# Patient Record
Sex: Male | Born: 1960 | Race: White | Hispanic: No | Marital: Married | Smoking: Never smoker
Health system: Southern US, Community
[De-identification: ages and names within clinical notes are randomized; demographics above are authoritative.]

## PROBLEM LIST (undated history)

## (undated) DIAGNOSIS — K222 Esophageal obstruction: Secondary | ICD-10-CM

## (undated) DIAGNOSIS — G4730 Sleep apnea, unspecified: Secondary | ICD-10-CM

## (undated) DIAGNOSIS — K219 Gastro-esophageal reflux disease without esophagitis: Secondary | ICD-10-CM

## (undated) DIAGNOSIS — F1110 Opioid abuse, uncomplicated: Secondary | ICD-10-CM

## (undated) DIAGNOSIS — J45909 Unspecified asthma, uncomplicated: Secondary | ICD-10-CM

## (undated) DIAGNOSIS — J690 Pneumonitis due to inhalation of food and vomit: Secondary | ICD-10-CM

## (undated) DIAGNOSIS — G8929 Other chronic pain: Secondary | ICD-10-CM

## (undated) DIAGNOSIS — K759 Inflammatory liver disease, unspecified: Secondary | ICD-10-CM

## (undated) DIAGNOSIS — I10 Essential (primary) hypertension: Secondary | ICD-10-CM

## (undated) DIAGNOSIS — S2224XA Fracture of xiphoid process, initial encounter for closed fracture: Secondary | ICD-10-CM

## (undated) DIAGNOSIS — F419 Anxiety disorder, unspecified: Secondary | ICD-10-CM

## (undated) HISTORY — PX: OTHER SURGICAL HISTORY: SHX169

## (undated) HISTORY — DX: Fracture of xiphoid process, initial encounter for closed fracture: S22.24XA

## (undated) HISTORY — DX: Inflammatory liver disease, unspecified: K75.9

---

## 2004-09-13 ENCOUNTER — Emergency Department (HOSPITAL_COMMUNITY): Admission: EM | Admit: 2004-09-13 | Discharge: 2004-09-13 | Payer: Self-pay | Admitting: Emergency Medicine

## 2010-04-21 ENCOUNTER — Emergency Department (HOSPITAL_COMMUNITY): Admission: EM | Admit: 2010-04-21 | Discharge: 2010-04-21 | Payer: Self-pay | Admitting: Emergency Medicine

## 2010-08-17 HISTORY — PX: OTHER SURGICAL HISTORY: SHX169

## 2013-01-17 ENCOUNTER — Ambulatory Visit (INDEPENDENT_AMBULATORY_CARE_PROVIDER_SITE_OTHER): Payer: 59 | Admitting: Internal Medicine

## 2013-01-17 ENCOUNTER — Encounter (INDEPENDENT_AMBULATORY_CARE_PROVIDER_SITE_OTHER): Payer: Self-pay | Admitting: Internal Medicine

## 2013-01-17 VITALS — BP 118/70 | HR 74 | Temp 97.7°F | Resp 18 | Ht 71.0 in | Wt 174.4 lb

## 2013-01-17 DIAGNOSIS — Z8619 Personal history of other infectious and parasitic diseases: Secondary | ICD-10-CM | POA: Insufficient documentation

## 2013-01-17 DIAGNOSIS — B182 Chronic viral hepatitis C: Secondary | ICD-10-CM

## 2013-01-17 DIAGNOSIS — F32A Depression, unspecified: Secondary | ICD-10-CM | POA: Insufficient documentation

## 2013-01-17 DIAGNOSIS — F418 Other specified anxiety disorders: Secondary | ICD-10-CM | POA: Insufficient documentation

## 2013-01-17 DIAGNOSIS — F329 Major depressive disorder, single episode, unspecified: Secondary | ICD-10-CM | POA: Insufficient documentation

## 2013-01-17 DIAGNOSIS — M25561 Pain in right knee: Secondary | ICD-10-CM | POA: Insufficient documentation
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
## 2013-01-17 NOTE — Patient Instructions (Addendum)
Physician will contact you with results of blood work and ultrasound when completed. 

## 2013-01-18 LAB — HCV RNA QUANT RFLX ULTRA OR GENOTYP: HCV Quantitative Log: 2.18 {Log} — ABNORMAL HIGH (ref <1.18)

## 2013-01-19 ENCOUNTER — Other Ambulatory Visit (HOSPITAL_COMMUNITY): Payer: 59

## 2013-01-23 ENCOUNTER — Ambulatory Visit (HOSPITAL_COMMUNITY)
Admission: RE | Admit: 2013-01-23 | Discharge: 2013-01-23 | Disposition: A | Discharge status: Home / Self Care | Payer: 59 | Source: Ambulatory Visit | Attending: Internal Medicine | Admitting: Internal Medicine

## 2013-01-23 DIAGNOSIS — K739 Chronic hepatitis, unspecified: Secondary | ICD-10-CM | POA: Insufficient documentation

## 2013-01-23 DIAGNOSIS — B182 Chronic viral hepatitis C: Secondary | ICD-10-CM

## 2013-01-27 ENCOUNTER — Telehealth (INDEPENDENT_AMBULATORY_CARE_PROVIDER_SITE_OTHER): Payer: Self-pay | Admitting: *Deleted

## 2013-01-27 NOTE — Telephone Encounter (Signed)
Forwarded to Dr.Rehman. 

## 2013-01-27 NOTE — Telephone Encounter (Signed)
Was told to call Dr. Karilyn Cota back and let him know when it is a good time to reach him. He gets off work at 4 pm. 4 to 4:30 is best or anytime afterwards. The best phone number is [phone].

## 2013-01-29 NOTE — Telephone Encounter (Signed)
I called in yesterday afternoon and there was no answer message left; He needs ultrasound-guided liver biopsy and office visit. Liver biopsy non-urgent can be performed in July or August

## 2013-01-30 ENCOUNTER — Other Ambulatory Visit (INDEPENDENT_AMBULATORY_CARE_PROVIDER_SITE_OTHER): Payer: Self-pay | Admitting: Internal Medicine

## 2013-01-30 NOTE — Addendum Note (Signed)
Addended by: Malissa Hippo on: 01/30/2013 04:58 PM   Modules accepted: Orders

## 2013-01-30 NOTE — Telephone Encounter (Signed)
Apt has been scheduled for 04/03/13 with Dr. Karilyn Cota.

## 2013-01-30 NOTE — Telephone Encounter (Signed)
Order for liver bx sent to [facility], they will contact patient with appt

## 2013-02-14 ENCOUNTER — Encounter (INDEPENDENT_AMBULATORY_CARE_PROVIDER_SITE_OTHER): Payer: Self-pay

## 2013-03-01 ENCOUNTER — Other Ambulatory Visit: Payer: Self-pay | Admitting: Radiology

## 2013-03-02 ENCOUNTER — Other Ambulatory Visit: Payer: Self-pay | Admitting: Radiology

## 2013-03-06 ENCOUNTER — Encounter (HOSPITAL_COMMUNITY): Payer: Self-pay | Admitting: Pharmacy Technician

## 2013-03-07 ENCOUNTER — Encounter (HOSPITAL_COMMUNITY): Payer: Self-pay

## 2013-03-07 ENCOUNTER — Ambulatory Visit (HOSPITAL_COMMUNITY)
Admission: RE | Admit: 2013-03-07 | Discharge: 2013-03-07 | Disposition: A | Discharge status: Home / Self Care | Payer: 59 | Source: Ambulatory Visit | Attending: Internal Medicine | Admitting: Internal Medicine

## 2013-03-07 DIAGNOSIS — B182 Chronic viral hepatitis C: Secondary | ICD-10-CM | POA: Insufficient documentation

## 2013-03-07 LAB — CBC
HCT: 42 % (ref 39.0–52.0)
Hemoglobin: 15.1 g/dL (ref 13.0–17.0)
MCHC: 36 g/dL (ref 30.0–36.0)
MCV: 88.4 fL (ref 78.0–100.0)
WBC: 5 10*3/uL (ref 4.0–10.5)

## 2013-03-07 LAB — APTT: aPTT: 31 seconds (ref 24–37)

## 2013-03-07 LAB — PROTIME-INR: INR: 1 (ref 0.00–1.49)

## 2013-03-07 MED ORDER — MIDAZOLAM HCL 2 MG/2ML IJ SOLN
INTRAMUSCULAR | Status: AC | PRN
  Administered 2013-03-07: 1 mg via INTRAVENOUS
  Administered 2013-03-07 (×2): 2 mg via INTRAVENOUS

## 2013-03-07 MED ORDER — MIDAZOLAM HCL 2 MG/2ML IJ SOLN
INTRAMUSCULAR | Status: AC
  Filled 2013-03-07: qty 6

## 2013-03-07 MED ORDER — FENTANYL CITRATE 0.05 MG/ML IJ SOLN
INTRAMUSCULAR | Status: AC
  Filled 2013-03-07: qty 4

## 2013-03-07 MED ORDER — FENTANYL CITRATE 0.05 MG/ML IJ SOLN
INTRAMUSCULAR | Status: AC | PRN
  Administered 2013-03-07 (×2): 50 ug via INTRAVENOUS

## 2013-03-07 MED ORDER — SODIUM CHLORIDE 0.9 % IV SOLN
Freq: Once | INTRAVENOUS | Status: AC
  Administered 2013-03-07: 09:00:00 via INTRAVENOUS

## 2013-03-07 NOTE — H&P (Signed)
Chief Complaint: "I'm here for a liver biopsy" Referring Physician:Rehman HPI: Christian Ellison is an 52 y.o. male with chronic Hepatitis C and is referred for random liver core biopsy. PMHx and meds reviewed. Pt feels well otherwise.  Past Medical History: History reviewed. No pertinent past medical history.  Past Surgical History:  Past Surgical History  Procedure Laterality Date  . Rotor cuff  2012    Right Shoulder  . Fracture right leg      Patient has rod/screws in this leg    Family History: No family history on file.  Social History:  reports that he has never smoked. He has never used smokeless tobacco. He reports that he does not drink alcohol or use illicit drugs.  Allergies: No Known Allergies  Medications:   Medication List    ASK your doctor about these medications       ALPRAZolam 1 MG tablet  Commonly known as:  XANAX  Take 1 mg by mouth at bedtime as needed for sleep.     aspirin 81 MG tablet  Take 81 mg by mouth daily.     citalopram 40 MG tablet  Commonly known as:  CELEXA  Take 40 mg by mouth daily.     LORazepam 1 MG tablet  Commonly known as:  ATIVAN  Take 1 mg by mouth at bedtime as needed. For sleep     MULTIVITAMIN PO  Take 1 tablet by mouth daily.     NORCO 10-325 MG per tablet  Generic drug:  HYDROcodone-acetaminophen  Take 1 tablet by mouth every 8 (eight) hours as needed for pain.        Please HPI for pertinent positives, otherwise complete 10 system ROS negative.  Physical Exam: There were no vitals taken for this visit.    General Appearance:  Alert, cooperative, no distress, appears stated age  Head:  Normocephalic, without obvious abnormality, atraumatic  ENT: Unremarkable  Neck: Supple, symmetrical, trachea midline  Lungs:   Clear to auscultation bilaterally, no w/r/r, respirations unlabored without use of accessory muscles.  Heart:  Regular rate and rhythm, S1, S2 normal, no murmur, rub or gallop.  Abdomen:    Soft, non-tender, non distended.  Neurologic: Normal affect, no gross deficits.   Results for orders placed during the hospital encounter of 03/07/13 (from the past 48 hour(s))  CBC     Status: None   Collection Time    03/07/13  9:03 AM      Result Value Range   WBC 5.0  4.0 - 10.5 K/uL   RBC 4.75  4.22 - 5.81 MIL/uL   Hemoglobin 15.1  13.0 - 17.0 g/dL   HCT 13.0  86.5 - 78.4 %   MCV 88.4  78.0 - 100.0 fL   MCH 31.8  26.0 - 34.0 pg   MCHC 36.0  30.0 - 36.0 g/dL   RDW 69.6  29.5 - 28.4 %   Platelets 177  150 - 400 K/uL   No results found.  Assessment/Plan Chronic Hepatitis C For US guided liver core biopsy. Discussed procedure, risks, complications, use of sedation. Labs reviewed, coags pending. Consent signed in chart  Brayton El PA-C 03/07/2013, 9:45 AM

## 2013-03-07 NOTE — ED Notes (Signed)
Patient denies pain and is resting comfortably with eyes closed 

## 2013-03-07 NOTE — Procedures (Signed)
Technically successful US guided biopsy of right lobe of liver.  No immediate complications.   

## 2013-03-16 ENCOUNTER — Encounter (INDEPENDENT_AMBULATORY_CARE_PROVIDER_SITE_OTHER): Payer: Self-pay | Admitting: *Deleted

## 2013-04-03 ENCOUNTER — Ambulatory Visit (INDEPENDENT_AMBULATORY_CARE_PROVIDER_SITE_OTHER): Payer: 59 | Admitting: Internal Medicine

## 2013-04-03 ENCOUNTER — Encounter (INDEPENDENT_AMBULATORY_CARE_PROVIDER_SITE_OTHER): Payer: Self-pay | Admitting: Internal Medicine

## 2013-04-03 VITALS — BP 110/70 | HR 78 | Resp 20 | Ht 71.0 in | Wt 180.7 lb

## 2013-04-03 DIAGNOSIS — B182 Chronic viral hepatitis C: Secondary | ICD-10-CM

## 2013-04-03 NOTE — Progress Notes (Signed)
Presenting complaint;  Discuss treatment for chronic hepatitis C.  Subjective:  Patient is 52 year old Caucasian male who has chronic hepatitis C. he underwent liver biopsy on 03/07/2013 which revealed grade 2 and stage 0 disease. He's now what he goes his employer is merging with another company and he is not sure whether or not he would have job or if his benefits will change. He is having to take pain medication for right knee and leg pain. He is working 7 days a week. Today he is accompanied by his girlfriend of 5 years. She wants to be tested but she does not have primary care physician. He remains with good appetite. He has gained 6 pounds. He continues to abstain from drinking alcohol. Last drink was 10 months ago to  Current Medications: Current Outpatient Prescriptions  Medication Sig Dispense Refill  . ALPRAZolam (XANAX) 1 MG tablet Take 1 mg by mouth at bedtime as needed for sleep.       Marland Kitchen aspirin 81 MG tablet Take 81 mg by mouth daily.      . citalopram (CELEXA) 40 MG tablet Take 40 mg by mouth daily.      Marland Kitchen gabapentin (NEURONTIN) 100 MG capsule Take 100 mg by mouth 2 (two) times daily.       . Multiple Vitamins-Minerals (MULTIVITAMIN PO) Take 1 tablet by mouth daily.       . Oxycodone HCl 10 MG TABS Take 10 mg by mouth 3 (three) times daily.        No current facility-administered medications for this visit.    Objective: Blood pressure 110/70, pulse 78, resp. rate 20, height 5\' 11"  (1.803 m), weight 180 lb 11.2 oz (81.965 kg). Patient is alert and in no acute distress   Labs/studies Results:  ultrasound liver guided biopsy on 03/07/2013 revealed grade 2 inflammation and stage 0 fibrosis. HCV quantitative was 153 IU/mL on 01/18/2023 and ALT of 2.18. Genotype results cannot be located.  Assessment:  #1. Chronic hepatitis C. Liver biopsy revealed no evidence of fibrosis whatsoever. He therefore could delay treatment until next generation of antivirals are available which  would be in near future. I doubt that he would be able to tolerate interferon-based therapies given his depression anxiety and insomnia.    Plan:  Office visit in 6 months or earlier if newer therapies are available. Will try to find results of genotype. Screening colonoscopy when he is ready.

## 2013-04-03 NOTE — Patient Instructions (Signed)
Office and contact you as soon as new medications approved

## 2013-04-04 ENCOUNTER — Telehealth (INDEPENDENT_AMBULATORY_CARE_PROVIDER_SITE_OTHER): Payer: Self-pay | Admitting: *Deleted

## 2013-04-04 NOTE — Telephone Encounter (Signed)
Patient was called and a message left asking that he have his lab work drawn.  I will make Misty Stanley aware.

## 2013-04-04 NOTE — Telephone Encounter (Signed)
Unable to find the Genotype in the lab selection. An order was written and sent to [facility].

## 2013-07-03 ENCOUNTER — Encounter (INDEPENDENT_AMBULATORY_CARE_PROVIDER_SITE_OTHER): Payer: Self-pay | Admitting: *Deleted

## 2013-07-03 ENCOUNTER — Encounter (INDEPENDENT_AMBULATORY_CARE_PROVIDER_SITE_OTHER): Payer: Self-pay | Admitting: Internal Medicine

## 2013-07-03 ENCOUNTER — Ambulatory Visit (INDEPENDENT_AMBULATORY_CARE_PROVIDER_SITE_OTHER): Payer: 59 | Admitting: Internal Medicine

## 2013-07-03 VITALS — BP 124/72 | HR 76 | Temp 98.7°F | Resp 18 | Ht 71.0 in | Wt 181.0 lb

## 2013-07-03 DIAGNOSIS — B182 Chronic viral hepatitis C: Secondary | ICD-10-CM

## 2013-07-03 NOTE — Patient Instructions (Signed)
Physician will contact with results of blood test and further recommendations.

## 2013-07-03 NOTE — Progress Notes (Signed)
Presenting complaint;   followup for chronic hepatitis C.  Subjective:  Patient is 52 year old Caucasian male who has chronic hepatitis C genotype unknown whether liver biopsy in July 2014 revealing grade 2 and stage I disease. At the time of last lifetest genotype was ordered but somehow not done. He was advised to go back to the lab but he was unable to do so. He is anxious to be treated. Today he is accompanied by his mother and sister. He has good appetite. He denies nausea vomiting abdominal pain melena or rectal bleeding. He is not drinking alcohol anymore. His only symptom is one of fatigue but he is working 6-7 days each week.  Current Medications: Current Outpatient Prescriptions  Medication Sig Dispense Refill  . acetaminophen (TYLENOL) 650 MG CR tablet Take 650 mg by mouth as needed for pain.      Marland Kitchen ALPRAZolam (XANAX) 1 MG tablet Take 1 mg by mouth at bedtime as needed for sleep.       Marland Kitchen aspirin 81 MG tablet Take 81 mg by mouth daily.      . citalopram (CELEXA) 40 MG tablet Take 40 mg by mouth daily.      . Naproxen Sodium (ALEVE PO) Take by mouth as needed.      . Oxycodone HCl 10 MG TABS Take 10 mg by mouth 3 (three) times daily.       Marland Kitchen gabapentin (NEURONTIN) 100 MG capsule Take 100 mg by mouth 2 (two) times daily.        No current facility-administered medications for this visit.     Objective: Blood pressure 124/72, pulse 76, temperature 98.7 F (37.1 C), temperature source Oral, resp. rate 18, height 5\' 11"  (1.803 m), weight 181 lb (82.101 kg). Patient is alert and in no acute distress.   Labs/studies Results: HCVRNA [id number] was 153 iu/mL HCV quantitative log     2.18    Assessment:  #1. Chronic hepatitis C. He has mild disease on liver biopsy of July 2014 revealing grade 2 stage 0 disease. Presuming he has genotype 1 he will be candidate for  Harvoni. Treatment options reviewed with patient and his family members including side effects. In all probability  he will not be getting interferometer based regimens therefore he does not have to be worried about depression or other serious side effects. #2. He is average risk for CRC.    Plan:  Patient will go to the lab for HCV genotyping. Therapy would be initiated as soon as genotype is determined. Will plan screening colonoscopy once C. therapy is completed.

## 2013-07-04 ENCOUNTER — Encounter (INDEPENDENT_AMBULATORY_CARE_PROVIDER_SITE_OTHER): Payer: Self-pay | Admitting: *Deleted

## 2013-07-07 ENCOUNTER — Telehealth (INDEPENDENT_AMBULATORY_CARE_PROVIDER_SITE_OTHER): Payer: Self-pay | Admitting: *Deleted

## 2013-07-07 LAB — HEPATITIS C GENOTYPE

## 2013-07-07 NOTE — Telephone Encounter (Signed)
Open in error

## 2013-07-07 NOTE — Telephone Encounter (Signed)
Theora Gianotti called about Greg's recent lab. In Epic the HCV RNA Quant < 15 , the genotype is still pending. I called [facility] and they are going to address And let us know when the  result will be ready. Theora Gianotti would like for you to call her with your recommendations, I did tell her the above result. Theora Gianotti 's number is [phone]

## 2013-07-11 NOTE — Telephone Encounter (Signed)
I discussed results with Christian Ellison yesterday. Still waiting for final result.

## 2013-07-17 ENCOUNTER — Telehealth (INDEPENDENT_AMBULATORY_CARE_PROVIDER_SITE_OTHER): Payer: Self-pay | Admitting: *Deleted

## 2013-07-17 DIAGNOSIS — B1920 Unspecified viral hepatitis C without hepatic coma: Secondary | ICD-10-CM

## 2013-07-17 NOTE — Telephone Encounter (Signed)
Per Dr.Rehman the patient will need to have labs drawn in 3 months per NUR.

## 2013-08-14 ENCOUNTER — Ambulatory Visit (INDEPENDENT_AMBULATORY_CARE_PROVIDER_SITE_OTHER): Payer: 59 | Admitting: Internal Medicine

## 2013-08-31 ENCOUNTER — Telehealth (INDEPENDENT_AMBULATORY_CARE_PROVIDER_SITE_OTHER): Payer: Self-pay | Admitting: *Deleted

## 2013-08-31 ENCOUNTER — Other Ambulatory Visit (INDEPENDENT_AMBULATORY_CARE_PROVIDER_SITE_OTHER): Payer: Self-pay | Admitting: *Deleted

## 2013-08-31 DIAGNOSIS — B1920 Unspecified viral hepatitis C without hepatic coma: Secondary | ICD-10-CM

## 2013-08-31 NOTE — Telephone Encounter (Signed)
Please Phillips OdorGolding do HCV RNA by PCR quantitative now.

## 2013-08-31 NOTE — Telephone Encounter (Signed)
Christian Ellison called to speak with Christian Ellison. He was wanting to know what time his apt was for tomorrow with Dr. Karilyn Cotaehman. Advised him, the apt is 10/03/13 at 3:30 pm.  He would still like to speak with Christian Ellison. His return phone number is [phone].

## 2013-08-31 NOTE — Telephone Encounter (Signed)
Patient was called and made aware that per Dr.Rehman he may go ahead and get the lab work drawn. Patient plans to go 09/01/13.

## 2013-08-31 NOTE — Telephone Encounter (Signed)
Talked with patient, he thought that Misty StanleyLisa had told him that he was to have lab work tomorrow for [employer]wide[employer].Rehman. He is to have lab work on March 1,2015. Dr.Rehman requested that he have Hep C RNA Quant in 3 months on 07/15/13,has his titer was to low. He has an appointment 10/03/13 @ 3:30 pm. Forwarded to Dr.Rehman to review and make sure he doesn't want to move lab work up around office visit.

## 2013-09-01 ENCOUNTER — Encounter (INDEPENDENT_AMBULATORY_CARE_PROVIDER_SITE_OTHER): Payer: Self-pay | Admitting: *Deleted

## 2013-09-05 LAB — HEPATITIS C RNA QUANTITATIVE

## 2013-10-03 ENCOUNTER — Ambulatory Visit (INDEPENDENT_AMBULATORY_CARE_PROVIDER_SITE_OTHER): Payer: 59 | Admitting: Internal Medicine

## 2013-10-25 ENCOUNTER — Encounter (INDEPENDENT_AMBULATORY_CARE_PROVIDER_SITE_OTHER): Payer: Self-pay | Admitting: Internal Medicine

## 2013-10-25 ENCOUNTER — Ambulatory Visit (INDEPENDENT_AMBULATORY_CARE_PROVIDER_SITE_OTHER): Payer: 59 | Admitting: Internal Medicine

## 2013-10-25 VITALS — BP 103/74 | HR 76 | Temp 97.4°F | Resp 18 | Ht 71.0 in | Wt 183.0 lb

## 2013-10-25 DIAGNOSIS — Z1211 Encounter for screening for malignant neoplasm of colon: Secondary | ICD-10-CM

## 2013-10-25 DIAGNOSIS — Z8619 Personal history of other infectious and parasitic diseases: Secondary | ICD-10-CM

## 2013-10-25 NOTE — Patient Instructions (Signed)
Colonoscopy to be scheduled. 

## 2013-10-25 NOTE — Progress Notes (Addendum)
Presenting complaint;  History of hepatitis C.  Database;  Patient is 53 year old Caucasian male with history of chronic hepatitis C who was evaluated last year with intention to treat them. Liver biopsy revealed grade 2 and stage 0 disease. HCVRNA was positive in June 2014. However it was undetectable in November, 2014. There will therapy was put on hold. HCVRNA was repeated on 08/31/2013 and gain it was negative.  Subjective;  Patient is accompanied by his friend tonight today. His only complaint is one of back pain. He remains with good appetite. He denies abdominal pain fatigue or weakness. He is interested in undergoing screening colonoscopy. He hasn't had any alcohol in 15 months.   Current Medications: Current Outpatient Prescriptions  Medication Sig Dispense Refill  . ALPRAZolam (XANAX) 1 MG tablet Take 1 mg by mouth at bedtime as needed for sleep.       . citalopram (CELEXA) 40 MG tablet Take 40 mg by mouth daily.      Marland Kitchen. HYDROcodone-acetaminophen (NORCO) 10-325 MG per tablet Take 1 tablet by mouth as needed.       . Multiple Vitamins-Minerals (MULTI FOR HIM 50+ PO) Take by mouth daily.      . Naproxen Sodium (ALEVE PO) Take by mouth as needed.      . Oxycodone HCl 10 MG TABS Take 10 mg by mouth 3 (three) times daily.        No current facility-administered medications for this visit.    Objective: Blood pressure 103/74, pulse 76, temperature 97.4 F (36.3 C), temperature source Oral, resp. rate 18, height 5\' 11"  (1.803 m), weight 183 lb (83.008 kg). Patient is alert and in NAD. Conjunctiva is pink. Sclera is nonicteric Oropharyngeal mucosa is normal. No neck masses or thyromegaly noted. Cardiac exam with regular rhythm normal S1 and S2. No murmur or gallop noted. Lungs are clear to auscultation. Abdomen symmetrical soft and nontender without organomegaly or masses.  No LE edema or clubbing noted.  Labs/studies Results: HCVRNA results as above.  Assessment:  #1.  History of hepatitis C. He has cleared virus spontaneously. HCVRNA was negative in November 2014 and again in January 2015. Therefore no further workup needed other than checking LFTs. #2. Patient is average risk for CRC.   Plan:  LFTs. Screening colonoscopy. Office visit in one year.

## 2013-10-26 ENCOUNTER — Other Ambulatory Visit (INDEPENDENT_AMBULATORY_CARE_PROVIDER_SITE_OTHER): Payer: Self-pay | Admitting: *Deleted

## 2013-10-26 DIAGNOSIS — Z1211 Encounter for screening for malignant neoplasm of colon: Secondary | ICD-10-CM

## 2013-10-26 LAB — HEPATIC FUNCTION PANEL
ALT: 17 U/L (ref 0–53)
AST: 18 U/L (ref 0–37)
Albumin: 4.2 g/dL (ref 3.5–5.2)
Alkaline Phosphatase: 51 U/L (ref 39–117)
Bilirubin, Direct: 0.1 mg/dL (ref 0.0–0.3)
Indirect Bilirubin: 0.2 mg/dL (ref 0.2–1.2)
TOTAL PROTEIN: 6.9 g/dL (ref 6.0–8.3)
Total Bilirubin: 0.3 mg/dL (ref 0.2–1.2)

## 2013-10-27 ENCOUNTER — Encounter (INDEPENDENT_AMBULATORY_CARE_PROVIDER_SITE_OTHER): Payer: Self-pay | Admitting: *Deleted

## 2013-10-27 NOTE — Telephone Encounter (Signed)
Error

## 2013-11-10 ENCOUNTER — Encounter (HOSPITAL_COMMUNITY): Payer: Self-pay | Admitting: Pharmacy Technician

## 2013-11-22 ENCOUNTER — Ambulatory Visit (HOSPITAL_COMMUNITY)
Admission: RE | Admit: 2013-11-22 | Discharge: 2013-11-22 | Disposition: A | Discharge status: Home / Self Care | Payer: 59 | Source: Ambulatory Visit | Attending: Internal Medicine | Admitting: Internal Medicine

## 2013-11-22 ENCOUNTER — Encounter (HOSPITAL_COMMUNITY)
Admission: RE | Disposition: A | Discharge status: Home / Self Care | Payer: Self-pay | Source: Ambulatory Visit | Attending: Internal Medicine

## 2013-11-22 ENCOUNTER — Encounter (HOSPITAL_COMMUNITY): Payer: Self-pay | Admitting: *Deleted

## 2013-11-22 DIAGNOSIS — Q438 Other specified congenital malformations of intestine: Secondary | ICD-10-CM | POA: Insufficient documentation

## 2013-11-22 DIAGNOSIS — Z1211 Encounter for screening for malignant neoplasm of colon: Secondary | ICD-10-CM

## 2013-11-22 DIAGNOSIS — K644 Residual hemorrhoidal skin tags: Secondary | ICD-10-CM

## 2013-11-22 HISTORY — DX: Other chronic pain: G89.29

## 2013-11-22 HISTORY — PX: COLONOSCOPY: SHX5424

## 2013-11-22 SURGERY — COLONOSCOPY
Anesthesia: Moderate Sedation

## 2013-11-22 MED ORDER — MIDAZOLAM HCL 5 MG/5ML IJ SOLN
INTRAMUSCULAR | Status: AC
  Filled 2013-11-22: qty 5

## 2013-11-22 MED ORDER — MEPERIDINE HCL 50 MG/ML IJ SOLN
INTRAMUSCULAR | Status: DC | PRN
  Administered 2013-11-22 (×3): 25 mg via INTRAVENOUS

## 2013-11-22 MED ORDER — MEPERIDINE HCL 50 MG/ML IJ SOLN
INTRAMUSCULAR | Status: AC
  Filled 2013-11-22: qty 1

## 2013-11-22 MED ORDER — SODIUM CHLORIDE 0.9 % IV SOLN
INTRAVENOUS | Status: DC
  Administered 2013-11-22: 10:00:00 via INTRAVENOUS

## 2013-11-22 MED ORDER — MIDAZOLAM HCL 5 MG/5ML IJ SOLN
INTRAMUSCULAR | Status: DC | PRN
  Administered 2013-11-22 (×2): 3 mg via INTRAVENOUS
  Administered 2013-11-22 (×3): 2 mg via INTRAVENOUS
  Administered 2013-11-22: 3 mg via INTRAVENOUS

## 2013-11-22 MED ORDER — MEPERIDINE HCL 50 MG/ML IJ SOLN
INTRAMUSCULAR | Status: DC
Start: 2013-11-22 — End: 2013-11-22
  Filled 2013-11-22: qty 1

## 2013-11-22 MED ORDER — MIDAZOLAM HCL 5 MG/5ML IJ SOLN
INTRAMUSCULAR | Status: AC
  Filled 2013-11-22: qty 10

## 2013-11-22 NOTE — Discharge Instructions (Signed)
Resume usual medications and diet. °No driving for 24 hours. °Next screening exam in 10 years. °Colonoscopy, Care After °Refer to this sheet in the next few weeks. These instructions provide you with information on caring for yourself after your procedure. Your health care provider may also give you more specific instructions. Your treatment has been planned according to current medical practices, but problems sometimes occur. Call your health care provider if you have any problems or questions after your procedure. °WHAT TO EXPECT AFTER THE PROCEDURE  °After your procedure, it is typical to have the following: °· A small amount of blood in your stool. °· Moderate amounts of gas and mild abdominal cramping or bloating. °HOME CARE INSTRUCTIONS °· Do not drive, operate machinery, or sign important documents for 24 hours. °· You may shower and resume your regular physical activities, but move at a slower pace for the first 24 hours. °· Take frequent rest periods for the first 24 hours. °· Walk around or put a warm pack on your abdomen to help reduce abdominal cramping and bloating. °· Drink enough fluids to keep your urine clear or pale yellow. °· You may resume your normal diet as instructed by your health care provider. Avoid heavy or fried foods that are hard to digest. °· Avoid drinking alcohol for 24 hours or as instructed by your health care provider. °· Only take over-the-counter or prescription medicines as directed by your health care provider. °· If a tissue sample (biopsy) was taken during your procedure: °· Do not take aspirin or blood thinners for 7 days, or as instructed by your health care provider. °· Do not drink alcohol for 7 days, or as instructed by your health care provider. °· Eat soft foods for the first 24 hours. °SEEK MEDICAL CARE IF: °You have persistent spotting of blood in your stool 2 3 days after the procedure. °SEEK IMMEDIATE MEDICAL CARE IF: °· You have more than a small spotting of  blood in your stool. °· You pass large blood clots in your stool. °· Your abdomen is swollen (distended). °· You have nausea or vomiting. °· You have a fever. °· You have increasing abdominal pain that is not relieved with medicine. °Document Released: 03/17/2004 Document Revised: 05/24/2013 Document Reviewed: 04/10/2013 °Exit[employer]® Patient Information ©2014 [employer]. ° °

## 2013-11-22 NOTE — Op Note (Signed)
COLONOSCOPY PROCEDURE REPORT  PATIENT:  Christian EnsignJohn G Vandermeulen  MR#:  [id number] Birthdate:  May 05, 1961, 53 y.o., male Endoscopist:  Dr. Malissa HippoNajeeb U. Zymire Turnbo, MD Referred By:  Dr. Madelin RearLawrence J. Sherwood GamblerFusco, MD  Procedure Date: 11/22/2013  Procedure:   Colonoscopy  Indications: Patient is 53 year old Caucasian male who is undergoing average risk screening colonoscopy.  Informed Consent:  The procedure and risks were reviewed with the patient and informed consent was obtained.  Medications:  Demerol 75 mg IV Versed 15 mg IV  Description of procedure:  After a digital rectal exam was performed, that colonoscope was advanced from the anus through the rectum and colon to the area of the cecum, ileocecal valve and appendiceal orifice. The cecum was deeply intubated. These structures were well-seen and photographed for the record. From the level of the cecum and ileocecal valve, the scope was slowly and cautiously withdrawn. The mucosal surfaces were carefully surveyed utilizing scope tip to flexion to facilitate fold flattening as needed. The scope was pulled down into the rectum where a thorough exam including retroflexion was performed. Slim scope was used in order to complete the examination.  Findings:   Prep excellent. Normal mucosa without polyps or diverticular changes. Redundant sigmoid colon. Normal rectal mucosa. Small hemorrhoids below the dentate line.   Therapeutic/Diagnostic Maneuvers Performed:  None  Complications:  None  Cecal Withdrawal Time:  7 minutes  Impression:  Normal colonoscopy except small external hemorrhoids.  Recommendations:  Standard instructions given. Next screening exam in 10 years.  Malissa Hippoajeeb U Jimma Ortman  11/22/2013 11:16 AM  CC: Dr. Cassell SmilesFUSCO,LAWRENCE J., MD & Dr. Bonnetta BarryNo ref. provider found

## 2013-11-22 NOTE — H&P (Signed)
Christian Ellison Christian Ellison is an 53 y.o. male.   Chief Complaint: Patient is  here for colonoscopy. HPI: Patient is 53 year old Caucasian male who is here for screening colonoscopy. He denies abdominal pain change in his bowel habits or rectal bleeding. Family history is negative for CRC.  Past Medical History  Diagnosis Date  . Hepatitis C; he has cleared virus spontaneously    . Chronic pain      Past Surgical History  Procedure Laterality Date  . Rotor cuff  2012    Right Shoulder  . Fracture right leg      Patient has rod/screws in this leg    Family History  Problem Relation Age of Onset  . Breast cancer Mother   . Colon cancer Father   . Bladder Cancer Father   . Prostate cancer Father   . Hypertension Sister   . Hypothyroidism Sister   . Healthy Sister   . Healthy Daughter   . Healthy Son   . Healthy Son    Social History:  reports that he has never smoked. He has never used smokeless tobacco. He reports that he does not drink alcohol or use illicit drugs.  Allergies: No Known Allergies  Medications Prior to Admission  Medication Sig Dispense Refill  . ALPRAZolam (XANAX) 1 MG tablet Take 1 mg by mouth at bedtime as needed for sleep.       . citalopram (CELEXA) 40 MG tablet Take 40 mg by mouth daily.      Marland Kitchen. HYDROcodone-acetaminophen (NORCO) 10-325 MG per tablet Take 1 tablet by mouth as needed. pain      . Multiple Vitamins-Minerals (MULTI FOR HIM 50+ PO) Take by mouth daily.      . Oxycodone HCl 10 MG TABS Take 10 mg by mouth 3 (three) times daily.       . Naproxen Sodium (ALEVE PO) Take 2 tablets by mouth as needed. pain        No results found for this or any previous visit (from the past 48 hour(s)). No results found.  ROS  Blood pressure 137/81, pulse 75, temperature 99 F (37.2 C), temperature source Oral, resp. rate 12, height 5\' 11"  (1.803 m), weight 183 lb (83.008 kg), SpO2 92.00%. Physical Exam  Constitutional: He appears well-developed and well-nourished.   HENT:  Mouth/Throat: Oropharynx is clear and moist.  Eyes: Conjunctivae are normal. No scleral icterus.  Neck: No thyromegaly present.  Cardiovascular: Normal rate, regular rhythm and normal heart sounds.   No murmur heard. Respiratory: Effort normal and breath sounds normal.  GI: Soft. He exhibits no distension and no mass. There is no tenderness.  Musculoskeletal: He exhibits no edema.  Lymphadenopathy:    He has no cervical adenopathy.  Neurological: He is alert.  Skin: Skin is warm and dry.     Assessment/Plan Average risk screening colonoscopy.  Christian Ellison 11/22/2013, 10:22 AM

## 2013-11-27 ENCOUNTER — Encounter (HOSPITAL_COMMUNITY): Payer: Self-pay | Admitting: Internal Medicine

## 2014-12-05 ENCOUNTER — Encounter (INDEPENDENT_AMBULATORY_CARE_PROVIDER_SITE_OTHER): Payer: Self-pay | Admitting: *Deleted

## 2014-12-06 ENCOUNTER — Encounter (HOSPITAL_COMMUNITY): Payer: Self-pay | Admitting: Emergency Medicine

## 2014-12-06 ENCOUNTER — Emergency Department (HOSPITAL_COMMUNITY)
Admission: EM | Admit: 2014-12-06 | Discharge: 2014-12-06 | Disposition: A | Discharge status: Home / Self Care | Payer: 59 | Attending: Emergency Medicine | Admitting: Emergency Medicine

## 2014-12-06 DIAGNOSIS — F1120 Opioid dependence, uncomplicated: Secondary | ICD-10-CM | POA: Insufficient documentation

## 2014-12-06 DIAGNOSIS — F1110 Opioid abuse, uncomplicated: Secondary | ICD-10-CM | POA: Diagnosis present

## 2014-12-06 DIAGNOSIS — Z79899 Other long term (current) drug therapy: Secondary | ICD-10-CM | POA: Insufficient documentation

## 2014-12-06 DIAGNOSIS — Z8719 Personal history of other diseases of the digestive system: Secondary | ICD-10-CM | POA: Diagnosis not present

## 2014-12-06 DIAGNOSIS — G8929 Other chronic pain: Secondary | ICD-10-CM | POA: Diagnosis not present

## 2014-12-06 HISTORY — DX: Opioid abuse, uncomplicated: F11.10

## 2014-12-06 MED ORDER — LOPERAMIDE HCL 2 MG PO CAPS: 2.0000 mg | ORAL_CAPSULE | Freq: Four times a day (QID) | ORAL | Status: DC | PRN

## 2014-12-06 MED ORDER — METHOCARBAMOL 500 MG PO TABS: 1000.0000 mg | ORAL_TABLET | Freq: Four times a day (QID) | ORAL | Status: DC | PRN

## 2014-12-06 MED ORDER — ONDANSETRON HCL 4 MG PO TABS: 4.0000 mg | ORAL_TABLET | Freq: Three times a day (TID) | ORAL | Status: DC | PRN

## 2014-12-06 NOTE — Discharge Instructions (Signed)
Please follow with your primary care doctor in the next 2 days for a check-up. They must obtain records for further management.   Do not hesitate to return to the Emergency Department for any new, worsening or concerning symptoms.     Emergency Department Resource Guide 1) Find a Doctor and Pay Out of Pocket Although you won't have to find out who is covered by your insurance plan, it is a good idea to ask around and get recommendations. You will then need to call the office and see if the doctor you have chosen will accept you as a new patient and what types of options they offer for patients who are self-pay. Some doctors offer discounts or will set up payment plans for their patients who do not have insurance, but you will need to ask so you aren't surprised when you get to your appointment.  2) Contact Your Local Health Department Not all health departments have doctors that can see patients for sick visits, but many do, so it is worth a call to see if yours does. If you don't know where your local health department is, you can check in your phone book. The CDC also has a tool to help you locate your state's health department, and many state websites also have listings of all of their local health departments.  3) Find a Walk-in Clinic If your illness is not likely to be very severe or complicated, you may want to try a walk in clinic. These are popping up all over the country in pharmacies, drugstores, and shopping centers. They're usually staffed by nurse practitioners or physician assistants that have been trained to treat common illnesses and complaints. They're usually fairly quick and inexpensive. However, if you have serious medical issues or chronic medical problems, these are probably not your best option.  No Primary Care Doctor: - Call Health Connect at  [phone] - they can help you locate a primary care doctor that  accepts your insurance, provides certain services, etc. - Physician  Referral Service- [phone]  Chronic Pain Problems: Organization         Address  Phone   Notes  [facility]  [phone] Patients need to be referred by their primary care doctor.   Medication Assistance: Organization         Address  Phone   Notes  [facility] Medication [facility] [address] [place], [place] [address] [phone] --Must be a resident of [place] [facility] [place] -- Must have NO insurance coverage whatsoever (no Medicaid/ Medicare, etc.) -- The pt. MUST have a primary care doctor that directs their care regularly and follows them in the community   MedAssist  [phone]   [employer]  [phone]    Agencies that provide inexpensive medical care: Organization         Address  Phone   Notes  [facility]  [phone]   [facility]    [phone]   [facility] [address] [place], [place] [address] [phone]   [facility] [address], [place] [phone]   [facility]anned Parenthood    [phone]   [facility]    [phone]) [phone]   [facility]  [address], [place] Phone:  [phone], Fax:  [phone] Hours of Operation:  9 am - 6 pm, M-F.  Also accepts  Medicaid/Medicare and self-pay.  [facility] for [place] [address], [address], [place] Phone: [phone], Fax: [phone]. Hours of Operation:  8:30 am - 5:30 pm, M-F.  Also accepts Medicaid and self-pay.  [facility] [place] [address] [place] Phone: [phone]   [place], [place], [place] [phone], Ext. [phone] Mondays & Thursdays: 7-9 AM.  First 15 patients are seen on a first come, first serve basis.    [place] Providers:  Organization         Address  Phone   Notes  [facility] [address], [place] [phone] Also accepts self-pay patients.  [facility] [id number] [place], [place]  [phone]   [place], [address], [place] [phone]   [facility] [address], [place] [phone]   Lucianne Lei [address], [place]   [phone] Only accepts [place] Access [place] patients after they have their name applied to their card.   Self-Pay (no insurance) in [facility]:  Organization         Address  Phone   Notes  Sickle Cell Patients, [facility] [place] [phone]   [facility] [place] [phone]   [facility] [place]  [place], [place], [place] [phone]   [facility]/Dr. Osei-Bonsu  [address], [place] or [address], [place] [phone] Phone number for both [place] and [place] locations is the same.  [facility]gent [facility] and [facility] [address] [place] [phone]   [facility] [address] [place] or [address] [phone]   [facility] [address] [place] [phone], phone; [phone], fax Sees patients 1st and 3rd Saturday of every month.  Must not qualify for public or private insurance (i.e. Medicaid, Medicare, [place] Health Choice, Veterans' Benefits)  Household income should be no more than 200% of the poverty level The clinic cannot treat you if you are pregnant or think you are pregnant  Sexually transmitted diseases are not treated at the clinic.    Dental Care: Organization         Address  Phone  Notes  [facility] Department of [facility] [place] [phone] Accepts children up to age 64 who are enrolled  in [place] or [place]; pregnant women with a Medicaid card; and children who have applied for Medicaid or [place] Health Choice, but were declined, whose parents can pay a reduced fee at time of service.  [facility] Department of [facility]  [address], [place] [phone] Accepts children up to age 8 who are enrolled in [place] or [place]; pregnant women with a Medicaid card; and children who have applied for Medicaid or [place] Health Choice, but were declined, whose parents can pay a reduced fee at time of service.  [facility] PRO[facility]  [place] [phone] Patients are seen by appointment only. Walk-ins are not accepted. [place] will see patients 59 years of age and older. Monday - Tuesday (8am-5pm) Most  Wednesdays (8:30-5pm) $30 per visit, cash only  [facility] Adult Dental Access PROGRAM  [address], [facility] [phone] Patients are seen by appointment only. Walk-ins are not accepted. [place] will see patients 42 years of age and older. One Wednesday Evening (Monthly: Volunteer Based).  $30 per visit, cash only  [place]  [phone] for adults; Children under age 75, call Graduate Pediatric Dentistry at [phone]. Children aged 77-14, please call [phone] to request a pediatric application.  Dental services are provided in all areas of dental care including fillings, crowns and bridges, complete and partial dentures, implants, gum treatment, root canals, and extractions. Preventive care is also provided. Treatment is provided to both adults and children. Patients are selected via a lottery and there is often a waiting list.   [facility] [address], [place]  [phone] [email or web address]   [facility] [address] [place], [place] [phone], Ext. [phone] Second and Fourth Thursday of each month, opens at  6:30 AM; Clinic ends at 9 AM.  Patients are seen on a first-come first-served basis, and a limited number are seen during each clinic.   [facility]  [address] Hillard Danker [place], [place] [phone]   Eligibility Requirements You must have lived in [place], [place], or [place] counties for at least the last three months.   You cannot be eligible for state or federal sponsored [employer], including [employer], [place], or [employer].   You generally cannot be eligible for healthcare insurance through your employer.    How to apply: Eligibility screenings are held every Tuesday and Wednesday afternoon from 1:00 pm until 4:00 pm. You do not need an appointment for the interview!  [facility] [address] [place], [place]   [place] Department  [place]  [phone]    Behavioral Health Resources in the Community: Intensive Outpatient Programs Organization         Address  Phone  Notes  [place]. [address] [place], [place] [phone]   [facility] [address], [place], [place]   ADS: Alcohol & Drug Svcs [address] [place], [place]   [place] [address]  [place], [place] or [phone]   Substance Abuse Resources Organization         Address  Phone  Notes  Alcohol and Drug Services  [phone]   [place]  [phone]   The [facility]ia   Chinita Pester  [phone]   Residential & Outpatient Substance Abuse Program  [phone]   Psychological Services Organization         Address  Phone  Notes  [facility]  [place]  [phone]   [place] [address] [place] or [phone]    Mobile Crisis Teams Organization         Address  Phone  Notes  [facility], Mobile Crisis Care Unit  [phone]   [facility]  [address] [place], [place]   [place] Levels [address] [place] [phone]    Self-Help/Support Groups Organization         Address  Phone  Notes  [facility]oc[facility] of [place] - variety of support groups  [phone]- [phone] Call for more information  Narcotics Anonymous (NA), [facility] [address], [place]  2 meetings at this location   Statisticianesidential Treatment Programs Organization         Address  Phone  Notes  [facility] [address],    [place]  [phone]   [facility]  [address], [place] [phone], [place], [place] [phone]   [facility] [address] [place] [address], [place] [phone] Admissions: 8am-3pm M-F  [facility] [address]    [place], [place] [phone]   [facility] [address], [place], [place] [phone]   [facility] [address]  [place], [place] [phone]   [facility] - Intensive Outpatient [address], [place], [place] [phone]   [facility] ([facility] [address]  [place], [place] [phone] or [phone]   [facility] ([facility]) [address] [place], [place] [phone] Accepts Medicaid  [facility] [address]  [place] [phone] Substance Abuse/Addiction Treatment   [facility] Behavioral Health Resources Organization         Address  Phone  Notes  [facility]  [phone]   Angie FavaJulie Brannon, PhD [address], [address] [place], [place]   [phone]) [phone] or [phone]   [facility]   [address] [place], [place] [phone]   [facility]  [address], [place], [place] [phone] Insurance/Medicaid/sponsorship through [facility]  [facility] Families [address]                                    [place], [place] [phone] Therapy/tele-psych/case  [facility] [address]   [place], [place] [phone]) [phone]    Dr. Lolly MustacheArfeen  [phone]   [facility]  [employer] [facility] 1) [address], [place]erra 2) [address], [place] 3)  [address], [place] [phone]  [phone](336) [phone]   [facility] Child Abuse Hotline [phone]) [phone] or [phone]) [phone] (After Hours)       Opioid Use Disorder Opioid use disorder is a mental disorder. It is the continued nonmedical use of opioids in spite of risks to health and well-being. Misused opioids include the street drug heroin. They also include pain medicines such as morphine, hydrocodone, oxycodone, and fentanyl. Opioids are very addictive. People who misuse opioids get an exaggerated feeling of well-being. Opioid use disorder often disrupts activities at home, work, or school. It may cause mental or physical problems.  A family history of opioid use disorder puts you at higher risk of it. People with opioid use disorder often misuse other drugs or have mental illness such as depression, posttraumatic stress disorder, or antisocial personality disorder. They also are at risk of suicide and death from overdose. SIGNS AND SYMPTOMS  Signs and symptoms of opioid use disorder include:  Use of opioids in larger amounts or over a longer period than intended.  Unsuccessful attempts to cut down or control opioid use.  A lot of time spent obtaining, using, or recovering from the effects of opioids.  A strong desire or urge to use opioids (craving).  Continued use of opioids in  spite of major problems at work, school, or home because of use.  Continued use of opioids in spite of relationship problems because of  use.  Giving up or cutting down on important life activities because of opioid use.  Use of opioids over and over in situations when it is physically hazardous, such as driving a car.  Continued use of opioids in spite of a physical problem that is likely related to use. Physical problems can include:  Severe constipation.  Poor nutrition.  Infertility.  Tuberculosis.  Aspiration pneumonia.  Infections such as human immunodeficiency virus (HIV) and hepatitis (from injecting opioids).  Continued use of opioids in spite of a mental problem that is likely related to use. Mental problems can include:  Depression.  Anxiety.  Hallucinations.  Sleep problems.  Loss of sexual function.  Need to use more and more opioids to get the same effect, or lessened effect over time with use of the same amount (tolerance).  Having withdrawal symptoms when opioid use is stopped, or using opioids to reduce or avoid withdrawal symptoms. Withdrawal symptoms include:  Depressed, anxious, or irritable mood.  Nausea, vomiting, diarrhea, or intestinal cramping.  Muscle aches or spasms.  Excessive tearing or runny nose.  Dilated pupils, sweating, or hairs standing on end.  Yawning.  Fever, raised blood pressure, or fast pulse.  Restlessness or trouble sleeping. This does not apply to people taking opioids for medical reasons only. DIAGNOSIS Opioid use disorder is diagnosed by your health care provider. You may be asked questions about your opioid use and and how it affects your life. A physical exam may be done. A drug screen may be ordered. You may be referred to a mental health professional. The diagnosis of opioid use disorder requires at least two symptoms within 12 months. The type of opioid use disorder you have depends on the number of signs and symptoms you have. The type may be:  Mild. Two or three signs and symptoms.   Moderate. Four or five signs and symptoms.   Severe.  Six or more signs and symptoms. TREATMENT  Treatment is usually provided by mental health professionals with training in substance use disorders.The following options are available:  Detoxification.This is the first step in treatment for withdrawal. It is medically supervised withdrawal with the use of medicines. These medicines lessen withdrawal symptoms. They also raise the chance of becoming opioid free.  Counseling, also known as talk therapy. Talk therapy addresses the reasons you use opioids. It also addresses ways to keep you from using again (relapse). The goals of talk therapy are to avoid relapse by:  Identifying and avoiding triggers for use.  Finding healthy ways to cope with stress.  Learning how to handle cravings.  Support groups. Support groups provide emotional support, advice, and guidance.  A medicine that blocks opioid receptors in your brain. This medicine can reduce opioid cravings that lead to relapse. This medicine also blocks the desired opioid effect when relapse occurs.  Opioids that are taken by mouth in place of the misused opioid (opioid maintenance treatment). These medicines satisfy cravings but are safer than commonly misused opioids. This often is the best option for people who continue to relapse with other treatments. HOME CARE INSTRUCTIONS   Take medicines only as directed by your health care provider.  Check with your health care provider before starting new medicines.  Keep all follow-up visits as directed by your health care provider. SEEK MEDICAL CARE IF:  You are not able  to take your medicines as directed.  Your symptoms get worse. SEEK IMMEDIATE MEDICAL CARE IF:  You have serious thoughts about hurting yourself or others.  You may have taken an overdose of opioids. FOR MORE INFORMATION  National Institute on Drug Abuse: http://www.price-smith.com/  Substance Abuse and Mental Health Services Administration: SkateOasis.com.pt Document Released:  05/31/2007 Document Revised: 12/18/2013 Document Reviewed: 08/16/2013 [facility] Patient Information 2015 [place], [place]. This information is not intended to replace advice given to you by your health care provider. Make sure you discuss any questions you have with your health care provider.

## 2014-12-06 NOTE — ED Provider Notes (Signed)
CSN: [id number]     Arrival date & time 12/06/14  1335 History  This chart was scribed for non-physician practitioner, Christian Ellison, working with Christian Ellison, Christian Ellison by Christian Ellison, ED Scribe. This patient was seen in room WTR8/WTR8 and the patient's care was started at 1:44 PM.   Chief Complaint  Patient presents with  . Opiate Detox    The history is provided by the patient. No language interpreter was used.   HPI Comments: Christian Ellison is a 54 y.o. male with a history of hepatitis and chronic pain who presents to the Emergency Department for an opiate detox. He states his last opiate use was yesterday. Pt states that has been using opiates for the last 20 years. Pt reports he also uses xanax and valium. He says that does not inject any drugs. Pt denies a history of smoking. Pt states he stopped drinking alcohol 3 years ago. He states that he currently takes celexa. Pt denies SI or HI. He denies nausea or vomiting at this time.   Past Medical History  Diagnosis Date  . Hepatitis   . Chronic pain    Past Surgical History  Procedure Laterality Date  . Rotor cuff  2012    Right Shoulder  . Fracture right leg      Patient has rod/screws in this leg  . Colonoscopy N/A 11/22/2013    Procedure: COLONOSCOPY;  Surgeon: Christian Ellison, Christian Ellison;  Location: [facility];  Service: Endoscopy;  Laterality: N/A;  1030   Family History  Problem Relation Age of Onset  . Breast cancer Mother   . Colon cancer Father   . Bladder Cancer Father   . Prostate cancer Father   . Hypertension Sister   . Hypothyroidism Sister   . Healthy Sister   . Healthy Daughter   . Healthy Son   . Healthy Son    History  Substance Use Topics  . Smoking status: Never Smoker   . Smokeless tobacco: Never Used  . Alcohol Use: No     Comment: Patient states that it has been over a year    Review of Systems  A complete 10 system review of systems was obtained and all systems are negative except as noted  in the HPI and PMH.   Allergies  Review of patient's allergies indicates no known allergies.  Home Medications   Prior to Admission medications   Medication Sig Start Date End Date Taking? Authorizing Provider  ALPRAZolam Prudy Feeler) 1 MG tablet Take 1 mg by mouth at bedtime as needed for sleep.     Historical Provider, Christian Ellison  citalopram (CELEXA) 40 MG tablet Take 40 mg by mouth daily.    Historical Provider, Christian Ellison  HYDROcodone-acetaminophen (NORCO) 10-325 MG per tablet Take 1 tablet by mouth as needed. pain 10/23/13   Historical Provider, Christian Ellison  Multiple Vitamins-Minerals (MULTI FOR HIM 50+ PO) Take by mouth daily.    Historical Provider, Christian Ellison  Naproxen Sodium (ALEVE PO) Take 2 tablets by mouth as needed. pain    Historical Provider, Christian Ellison  Oxycodone HCl 10 MG TABS Take 10 mg by mouth 3 (three) times daily.  03/31/13   Historical Provider, Christian Ellison   BP 134/83 mmHg  Pulse 71  Temp(Src) 98.6 F (37 C) (Oral)  Resp 16  SpO2 100% Physical Exam  Constitutional: He is oriented to person, place, and time. He appears well-developed and well-nourished. No distress.  HENT:  Head: Normocephalic and atraumatic.  Mouth/Throat: Oropharynx is clear and  moist.  Eyes: Conjunctivae and EOM are normal. Pupils are equal, round, and reactive to light.  Neck: Normal range of motion. Neck supple. No tracheal deviation present.  Cardiovascular: Normal rate, regular rhythm and intact distal pulses.   Pulmonary/Chest: Effort normal and breath sounds normal. No respiratory distress.  Abdominal: Soft. He exhibits no distension. There is no tenderness.  Musculoskeletal: Normal range of motion.  Neurological: He is alert and oriented to person, place, and time.  Skin: Skin is warm and dry. He is not diaphoretic.  Psychiatric: He has a normal mood and affect. His behavior is normal. He is not agitated. He expresses no homicidal and no suicidal ideation.  Nursing note and vitals reviewed.   ED Course  Procedures   DIAGNOSTIC  STUDIES: Oxygen Saturation is 100% on RA, normal by my interpretation.    COORDINATION OF CARE: 1:49 PM Discussed treatment plan with pt at bedside and pt agreed to plan.   Labs Review Labs Reviewed - No data to display  Imaging Review No results found.   EKG Interpretation None      MDM   Final diagnoses:  None    Filed Vitals:   12/06/14 1341  BP: 134/83  Pulse: 71  Temp: 98.6 F (37 C)  TempSrc: Oral  Resp: 16  SpO2: 100%    Christian Ellison is a pleasant 54 y.o. male requesting opiate detox, patient snorts prescription opiates which he buys on the street. Patient is calm, no symptoms as of yet, last use was yesterday. Evidently to the patient that we do not offer detox in the hospital. I have provided him with detox options as an outpatient. I will write him prescriptions for Imodium Robaxin and Zofran in case his symptoms worsen.  Evaluation does not show pathology that would require ongoing emergent intervention or inpatient treatment. Pt is hemodynamically stable and mentating appropriately. Discussed findings and plan with patient/guardian, who agrees with care plan. All questions answered. Return precautions discussed and outpatient follow up given.   Discharge Medication List as of 12/06/2014  1:54 PM    START taking these medications   Details  loperamide (IMODIUM) 2 MG capsule Take 1 capsule (2 mg total) by mouth 4 (four) times daily as needed for diarrhea or loose stools., Starting 12/06/2014, Until Discontinued, Print    methocarbamol (ROBAXIN) 500 MG tablet Take 2 tablets (1,000 mg total) by mouth 4 (four) times daily as needed (Pain)., Starting 12/06/2014, Until Discontinued, Print    ondansetron (ZOFRAN) 4 MG tablet Take 1 tablet (4 mg total) by mouth every 8 (eight) hours as needed for nausea or vomiting., Starting 12/06/2014, Until Discontinued, Print         I personally performed the services described in this documentation, which was scribed in  my presence. The recorded information has been reviewed and is accurate.      Christian Emeryicole Neesa Ellison, Christian Ellison 12/06/14 2034  Christian CoreNathan Pickering, Christian Ellison 12/07/14 [phone]

## 2014-12-06 NOTE — ED Notes (Signed)
Pt requesting opiate detox.  Last use yesterday.  Pt denies SI/HI.

## 2014-12-31 ENCOUNTER — Ambulatory Visit (INDEPENDENT_AMBULATORY_CARE_PROVIDER_SITE_OTHER): Payer: Self-pay | Admitting: Internal Medicine

## 2017-12-15 DIAGNOSIS — K759 Inflammatory liver disease, unspecified: Secondary | ICD-10-CM

## 2017-12-15 HISTORY — DX: Inflammatory liver disease, unspecified: K75.9

## 2018-01-02 ENCOUNTER — Inpatient Hospital Stay (HOSPITAL_COMMUNITY): Payer: Self-pay

## 2018-01-02 ENCOUNTER — Inpatient Hospital Stay (HOSPITAL_COMMUNITY)
Admission: AD | Admit: 2018-01-02 | Discharge: 2018-01-06 | DRG: 871 | Disposition: A | Discharge status: Home / Self Care | Payer: Self-pay | Source: Other Acute Inpatient Hospital | Attending: Family Medicine | Admitting: Family Medicine

## 2018-01-02 ENCOUNTER — Encounter (HOSPITAL_COMMUNITY): Payer: Self-pay

## 2018-01-02 DIAGNOSIS — R9431 Abnormal electrocardiogram [ECG] [EKG]: Secondary | ICD-10-CM

## 2018-01-02 DIAGNOSIS — T50901A Poisoning by unspecified drugs, medicaments and biological substances, accidental (unintentional), initial encounter: Secondary | ICD-10-CM | POA: Diagnosis present

## 2018-01-02 DIAGNOSIS — J9600 Acute respiratory failure, unspecified whether with hypoxia or hypercapnia: Secondary | ICD-10-CM

## 2018-01-02 DIAGNOSIS — Z4659 Encounter for fitting and adjustment of other gastrointestinal appliance and device: Secondary | ICD-10-CM

## 2018-01-02 DIAGNOSIS — Z8042 Family history of malignant neoplasm of prostate: Secondary | ICD-10-CM

## 2018-01-02 DIAGNOSIS — R748 Abnormal levels of other serum enzymes: Secondary | ICD-10-CM

## 2018-01-02 DIAGNOSIS — J690 Pneumonitis due to inhalation of food and vomit: Secondary | ICD-10-CM | POA: Diagnosis present

## 2018-01-02 DIAGNOSIS — R7401 Elevation of levels of liver transaminase levels: Secondary | ICD-10-CM

## 2018-01-02 DIAGNOSIS — F329 Major depressive disorder, single episode, unspecified: Secondary | ICD-10-CM | POA: Diagnosis present

## 2018-01-02 DIAGNOSIS — Z803 Family history of malignant neoplasm of breast: Secondary | ICD-10-CM

## 2018-01-02 DIAGNOSIS — Z800 Family history of malignant neoplasm of digestive organs: Secondary | ICD-10-CM

## 2018-01-02 DIAGNOSIS — B1920 Unspecified viral hepatitis C without hepatic coma: Secondary | ICD-10-CM | POA: Diagnosis present

## 2018-01-02 DIAGNOSIS — T424X1A Poisoning by benzodiazepines, accidental (unintentional), initial encounter: Secondary | ICD-10-CM | POA: Diagnosis present

## 2018-01-02 DIAGNOSIS — G8929 Other chronic pain: Secondary | ICD-10-CM | POA: Diagnosis present

## 2018-01-02 DIAGNOSIS — Z8249 Family history of ischemic heart disease and other diseases of the circulatory system: Secondary | ICD-10-CM

## 2018-01-02 DIAGNOSIS — J9601 Acute respiratory failure with hypoxia: Secondary | ICD-10-CM | POA: Diagnosis present

## 2018-01-02 DIAGNOSIS — I10 Essential (primary) hypertension: Secondary | ICD-10-CM | POA: Diagnosis present

## 2018-01-02 DIAGNOSIS — R001 Bradycardia, unspecified: Secondary | ICD-10-CM | POA: Diagnosis present

## 2018-01-02 DIAGNOSIS — R6520 Severe sepsis without septic shock: Secondary | ICD-10-CM | POA: Diagnosis present

## 2018-01-02 DIAGNOSIS — Y92009 Unspecified place in unspecified non-institutional (private) residence as the place of occurrence of the external cause: Secondary | ICD-10-CM

## 2018-01-02 DIAGNOSIS — F1110 Opioid abuse, uncomplicated: Secondary | ICD-10-CM | POA: Diagnosis present

## 2018-01-02 DIAGNOSIS — Z9289 Personal history of other medical treatment: Secondary | ICD-10-CM

## 2018-01-02 DIAGNOSIS — G92 Toxic encephalopathy: Secondary | ICD-10-CM | POA: Diagnosis present

## 2018-01-02 DIAGNOSIS — T402X1A Poisoning by other opioids, accidental (unintentional), initial encounter: Secondary | ICD-10-CM | POA: Diagnosis present

## 2018-01-02 DIAGNOSIS — I21A1 Myocardial infarction type 2: Secondary | ICD-10-CM | POA: Diagnosis present

## 2018-01-02 DIAGNOSIS — Z79891 Long term (current) use of opiate analgesic: Secondary | ICD-10-CM

## 2018-01-02 DIAGNOSIS — I959 Hypotension, unspecified: Secondary | ICD-10-CM | POA: Diagnosis present

## 2018-01-02 DIAGNOSIS — T50904A Poisoning by unspecified drugs, medicaments and biological substances, undetermined, initial encounter: Secondary | ICD-10-CM

## 2018-01-02 DIAGNOSIS — A419 Sepsis, unspecified organism: Principal | ICD-10-CM | POA: Diagnosis present

## 2018-01-02 DIAGNOSIS — R7989 Other specified abnormal findings of blood chemistry: Secondary | ICD-10-CM

## 2018-01-02 DIAGNOSIS — R1310 Dysphagia, unspecified: Secondary | ICD-10-CM | POA: Diagnosis present

## 2018-01-02 DIAGNOSIS — Z8052 Family history of malignant neoplasm of bladder: Secondary | ICD-10-CM

## 2018-01-02 DIAGNOSIS — N179 Acute kidney failure, unspecified: Secondary | ICD-10-CM | POA: Diagnosis present

## 2018-01-02 DIAGNOSIS — R740 Nonspecific elevation of levels of transaminase and lactic acid dehydrogenase [LDH]: Secondary | ICD-10-CM

## 2018-01-02 DIAGNOSIS — F1310 Sedative, hypnotic or anxiolytic abuse, uncomplicated: Secondary | ICD-10-CM | POA: Diagnosis present

## 2018-01-02 DIAGNOSIS — E872 Acidosis: Secondary | ICD-10-CM | POA: Diagnosis present

## 2018-01-02 DIAGNOSIS — I214 Non-ST elevation (NSTEMI) myocardial infarction: Secondary | ICD-10-CM

## 2018-01-02 DIAGNOSIS — G9340 Encephalopathy, unspecified: Secondary | ICD-10-CM

## 2018-01-02 LAB — BASIC METABOLIC PANEL
Anion gap: 9 (ref 5–15)
BUN: 24 mg/dL — ABNORMAL HIGH (ref 6–20)
CALCIUM: 7.9 mg/dL — AB (ref 8.9–10.3)
CHLORIDE: 102 mmol/L (ref 101–111)
CO2: 28 mmol/L (ref 22–32)
Creatinine, Ser: 1.07 mg/dL (ref 0.61–1.24)
GFR calc Af Amer: >60 mL/min (ref >60)
GFR calc non Af Amer: >60 mL/min (ref >60)
GLUCOSE: 127 mg/dL — AB (ref 65–99)
Potassium: 4.1 mmol/L (ref 3.5–5.1)
Sodium: 139 mmol/L (ref 135–145)

## 2018-01-02 LAB — CBC WITH DIFFERENTIAL/PLATELET
Abs Immature Granulocytes: 0.1 10*3/uL (ref 0.0–0.1)
BASOS ABS: 0 10*3/uL (ref 0.0–0.1)
Basophils Relative: 0 %
EOS ABS: 0 10*3/uL (ref 0.0–0.7)
Eosinophils Relative: 0 %
HCT: 41.1 % (ref 39.0–52.0)
Hemoglobin: 13.5 g/dL (ref 13.0–17.0)
Immature Granulocytes: 1 %
Lymphocytes Relative: 9 %
Lymphs Abs: 0.9 10*3/uL (ref 0.7–4.0)
MCH: 30.4 pg (ref 26.0–34.0)
MCHC: 32.8 g/dL (ref 30.0–36.0)
MCV: 92.6 fL (ref 78.0–100.0)
MONO ABS: 0.5 10*3/uL (ref 0.1–1.0)
MONOS PCT: 5 %
Neutro Abs: 8.7 10*3/uL — ABNORMAL HIGH (ref 1.7–7.7)
Neutrophils Relative %: 85 %
PLATELETS: 154 10*3/uL (ref 150–400)
RBC: 4.44 MIL/uL (ref 4.22–5.81)
RDW: 12.2 % (ref 11.5–15.5)
WBC: 10.2 10*3/uL (ref 4.0–10.5)

## 2018-01-02 LAB — BLOOD GAS, ARTERIAL
Acid-Base Excess: 3.5 mmol/L — ABNORMAL HIGH (ref 0.0–2.0)
Bicarbonate: 28.8 mmol/L — ABNORMAL HIGH (ref 20.0–28.0)
DRAWN BY: 51831
FIO2: 50
MECHVT: 440 mL
O2 Saturation: 93.2 %
PEEP: 5 cmH2O
Patient temperature: 101.2
RATE: 14 resp/min
pCO2 arterial: 58.6 mmHg — ABNORMAL HIGH (ref 32.0–48.0)
pH, Arterial: 7.321 — ABNORMAL LOW (ref 7.350–7.450)
pO2, Arterial: 78.9 mmHg — ABNORMAL LOW (ref 83.0–108.0)

## 2018-01-02 LAB — COMPREHENSIVE METABOLIC PANEL
ALBUMIN: 3.2 g/dL — AB (ref 3.5–5.0)
ALK PHOS: 61 U/L (ref 38–126)
ALT: 3475 U/L — AB (ref 17–63)
AST: 2905 U/L — ABNORMAL HIGH (ref 15–41)
Anion gap: 9 (ref 5–15)
BUN: 27 mg/dL — ABNORMAL HIGH (ref 6–20)
CALCIUM: 7.8 mg/dL — AB (ref 8.9–10.3)
CHLORIDE: 101 mmol/L (ref 101–111)
CO2: 27 mmol/L (ref 22–32)
CREATININE: 1.22 mg/dL (ref 0.61–1.24)
GFR calc non Af Amer: >60 mL/min (ref >60)
GLUCOSE: 123 mg/dL — AB (ref 65–99)
Potassium: 4.1 mmol/L (ref 3.5–5.1)
SODIUM: 137 mmol/L (ref 135–145)
Total Bilirubin: 1 mg/dL (ref 0.3–1.2)
Total Protein: 6.5 g/dL (ref 6.5–8.1)

## 2018-01-02 LAB — HEPARIN LEVEL (UNFRACTIONATED)
Heparin Unfractionated: <0.1 IU/mL — ABNORMAL LOW (ref 0.30–0.70)
Heparin Unfractionated: <0.1 IU/mL — ABNORMAL LOW (ref 0.30–0.70)
Heparin Unfractionated: 0.11 IU/mL — ABNORMAL LOW (ref 0.30–0.70)

## 2018-01-02 LAB — GLUCOSE, CAPILLARY
GLUCOSE-CAPILLARY: 109 mg/dL — AB (ref 65–99)
GLUCOSE-CAPILLARY: 81 mg/dL (ref 65–99)
Glucose-Capillary: 130 mg/dL — ABNORMAL HIGH (ref 65–99)
Glucose-Capillary: 135 mg/dL — ABNORMAL HIGH (ref 65–99)
Glucose-Capillary: 83 mg/dL (ref 65–99)

## 2018-01-02 LAB — URINALYSIS, ROUTINE W REFLEX MICROSCOPIC
Bilirubin Urine: NEGATIVE
Glucose, UA: NEGATIVE mg/dL
Ketones, ur: 5 mg/dL — AB
Leukocytes, UA: NEGATIVE
Nitrite: NEGATIVE
PH: 6 (ref 5.0–8.0)
Protein, ur: NEGATIVE mg/dL
Specific Gravity, Urine: 1.016 (ref 1.005–1.030)

## 2018-01-02 LAB — CK: CK TOTAL: 66 U/L (ref 49–397)

## 2018-01-02 LAB — RAPID URINE DRUG SCREEN, HOSP PERFORMED
AMPHETAMINES: NOT DETECTED
BENZODIAZEPINES: POSITIVE — AB
Barbiturates: NOT DETECTED
COCAINE: NOT DETECTED
OPIATES: NOT DETECTED
Tetrahydrocannabinol: NOT DETECTED

## 2018-01-02 LAB — COOXEMETRY PANEL
Carboxyhemoglobin: 1.2 % (ref 0.5–1.5)
METHEMOGLOBIN: 0.7 % (ref 0.0–1.5)
O2 SAT: 57.6 %
Total hemoglobin: 19.8 g/dL — ABNORMAL HIGH (ref 12.0–16.0)

## 2018-01-02 LAB — HIV ANTIBODY (ROUTINE TESTING W REFLEX): HIV SCREEN 4TH GENERATION: NONREACTIVE

## 2018-01-02 LAB — CBC
HEMATOCRIT: 38.5 % — AB (ref 39.0–52.0)
HEMOGLOBIN: 12.8 g/dL — AB (ref 13.0–17.0)
MCH: 30 pg (ref 26.0–34.0)
MCHC: 33.2 g/dL (ref 30.0–36.0)
MCV: 90.4 fL (ref 78.0–100.0)
Platelets: 148 10*3/uL — ABNORMAL LOW (ref 150–400)
RBC: 4.26 MIL/uL (ref 4.22–5.81)
RDW: 12.1 % (ref 11.5–15.5)
WBC: 9.9 10*3/uL (ref 4.0–10.5)

## 2018-01-02 LAB — LACTIC ACID, PLASMA
LACTIC ACID, VENOUS: 2.4 mmol/L — AB (ref 0.5–1.9)
LACTIC ACID, VENOUS: 1.6 mmol/L (ref 0.5–1.9)
LACTIC ACID, VENOUS: 1.5 mmol/L (ref 0.5–1.9)
Lactic Acid, Venous: 1.6 mmol/L (ref 0.5–1.9)
Lactic Acid, Venous: 1.8 mmol/L (ref 0.5–1.9)

## 2018-01-02 LAB — POCT I-STAT 3, ART BLOOD GAS (G3+)
Acid-Base Excess: 2 mmol/L (ref 0.0–2.0)
BICARBONATE: 26 mmol/L (ref 20.0–28.0)
O2 SAT: 82 %
PCO2 ART: 39.7 mmHg (ref 32.0–48.0)
PH ART: 7.428 (ref 7.350–7.450)
PO2 ART: 47 mmHg — AB (ref 83.0–108.0)
Patient temperature: 100.4
TCO2: 27 mmol/L (ref 22–32)

## 2018-01-02 LAB — TROPONIN I
TROPONIN I: 2.7 ng/mL — AB (ref <0.03)
TROPONIN I: 1.48 ng/mL — AB (ref <0.03)
TROPONIN I: 0.96 ng/mL — AB (ref <0.03)
Troponin I: 3.25 ng/mL (ref <0.03)

## 2018-01-02 LAB — MAGNESIUM: Magnesium: 2.1 mg/dL (ref 1.7–2.4)

## 2018-01-02 LAB — ACETAMINOPHEN LEVEL: Acetaminophen (Tylenol), Serum: <10 ug/mL — ABNORMAL LOW (ref 10–30)

## 2018-01-02 LAB — TRIGLYCERIDES: Triglycerides: 95 mg/dL (ref <150)

## 2018-01-02 LAB — PROCALCITONIN: Procalcitonin: 2.41 ng/mL

## 2018-01-02 LAB — PROTIME-INR
INR: 1.46
Prothrombin Time: 17.6 seconds — ABNORMAL HIGH (ref 11.4–15.2)

## 2018-01-02 LAB — PHOSPHORUS: PHOSPHORUS: 1.8 mg/dL — AB (ref 2.5–4.6)

## 2018-01-02 LAB — BRAIN NATRIURETIC PEPTIDE: B NATRIURETIC PEPTIDE 5: 310.8 pg/mL — AB (ref 0.0–100.0)

## 2018-01-02 LAB — SALICYLATE LEVEL: Salicylate Lvl: <7 mg/dL (ref 2.8–30.0)

## 2018-01-02 MED ORDER — VANCOMYCIN HCL IN DEXTROSE 1-5 GM/200ML-% IV SOLN
1000.0000 mg | Freq: Two times a day (BID) | INTRAVENOUS | Status: DC
  Administered 2018-01-02 – 2018-01-03 (×2): 1000 mg via INTRAVENOUS
  Filled 2018-01-02 (×2): qty 200

## 2018-01-02 MED ORDER — SODIUM CHLORIDE 0.9 % IV SOLN
250.0000 mL | INTRAVENOUS | Status: DC | PRN
  Administered 2018-01-03: 250 mL via INTRAVENOUS

## 2018-01-02 MED ORDER — HEPARIN SODIUM (PORCINE) 5000 UNIT/ML IJ SOLN: 5000.0000 [IU] | Freq: Three times a day (TID) | INTRAMUSCULAR | Status: DC

## 2018-01-02 MED ORDER — ORAL CARE MOUTH RINSE
15.0000 mL | Freq: Four times a day (QID) | OROMUCOSAL | Status: DC
  Administered 2018-01-02 – 2018-01-03 (×7): 15 mL via OROMUCOSAL

## 2018-01-02 MED ORDER — PIPERACILLIN-TAZOBACTAM 3.375 G IVPB
3.3750 g | Freq: Three times a day (TID) | INTRAVENOUS | Status: DC
  Filled 2018-01-02: qty 50

## 2018-01-02 MED ORDER — VANCOMYCIN HCL 10 G IV SOLR
1500.0000 mg | Freq: Once | INTRAVENOUS | Status: AC
  Administered 2018-01-02: 1500 mg via INTRAVENOUS
  Filled 2018-01-02: qty 1500

## 2018-01-02 MED ORDER — SODIUM GLYCEROPHOSPHATE 1 MMOLE/ML IV SOLN
30.0000 mmol | Freq: Once | INTRAVENOUS | Status: AC
  Administered 2018-01-02: 30 mmol via INTRAVENOUS
  Filled 2018-01-02: qty 30

## 2018-01-02 MED ORDER — SODIUM CHLORIDE 0.9 % IV SOLN
250.0000 mL | INTRAVENOUS | Status: DC | PRN
  Administered 2018-01-02: 250 mL via INTRAVENOUS

## 2018-01-02 MED ORDER — IBUPROFEN 100 MG/5ML PO SUSP
600.0000 mg | Freq: Three times a day (TID) | ORAL | Status: DC | PRN
  Administered 2018-01-02: 600 mg
  Filled 2018-01-02: qty 30

## 2018-01-02 MED ORDER — ASPIRIN 300 MG RE SUPP
300.0000 mg | RECTAL | Status: AC
  Administered 2018-01-02: 300 mg via RECTAL
  Filled 2018-01-02: qty 1

## 2018-01-02 MED ORDER — INSULIN ASPART 100 UNIT/ML ~~LOC~~ SOLN
1.0000 [IU] | SUBCUTANEOUS | Status: DC
  Administered 2018-01-02 – 2018-01-03 (×3): 1 [IU] via SUBCUTANEOUS

## 2018-01-02 MED ORDER — HEPARIN (PORCINE) IN NACL 100-0.45 UNIT/ML-% IJ SOLN
1800.0000 [IU]/h | INTRAMUSCULAR | Status: DC
  Administered 2018-01-02: 1000 [IU]/h via INTRAVENOUS
  Administered 2018-01-02: 1400 [IU]/h via INTRAVENOUS
  Administered 2018-01-03: 1600 [IU]/h via INTRAVENOUS
  Administered 2018-01-04: 1800 [IU]/h via INTRAVENOUS
  Filled 2018-01-02 (×4): qty 250

## 2018-01-02 MED ORDER — FENTANYL 2500MCG IN NS 250ML (10MCG/ML) PREMIX INFUSION
25.0000 ug/h | INTRAVENOUS | Status: DC
  Administered 2018-01-02: 150 ug/h via INTRAVENOUS
  Administered 2018-01-02: 225 ug/h via INTRAVENOUS
  Administered 2018-01-02: 100 ug/h via INTRAVENOUS
  Administered 2018-01-03: 200 ug/h via INTRAVENOUS
  Administered 2018-01-03: 300 ug/h via INTRAVENOUS
  Filled 2018-01-02 (×3): qty 250

## 2018-01-02 MED ORDER — DOCUSATE SODIUM 50 MG/5ML PO LIQD
100.0000 mg | Freq: Two times a day (BID) | ORAL | Status: DC
  Administered 2018-01-02 – 2018-01-03 (×3): 100 mg
  Filled 2018-01-02 (×4): qty 10

## 2018-01-02 MED ORDER — LACTATED RINGERS IV BOLUS (SEPSIS)
500.0000 mL | Freq: Once | INTRAVENOUS | Status: AC
  Administered 2018-01-02: 500 mL via INTRAVENOUS

## 2018-01-02 MED ORDER — CHLORHEXIDINE GLUCONATE 0.12% ORAL RINSE (MEDLINE KIT)
15.0000 mL | Freq: Two times a day (BID) | OROMUCOSAL | Status: DC
  Administered 2018-01-02 – 2018-01-06 (×8): 15 mL via OROMUCOSAL

## 2018-01-02 MED ORDER — PIPERACILLIN-TAZOBACTAM 3.375 G IVPB 30 MIN
3.3750 g | Freq: Once | INTRAVENOUS | Status: DC
  Filled 2018-01-02: qty 50

## 2018-01-02 MED ORDER — MIDAZOLAM HCL 2 MG/2ML IJ SOLN: 2.0000 mg | INTRAMUSCULAR | Status: DC | PRN

## 2018-01-02 MED ORDER — FENTANYL CITRATE (PF) 100 MCG/2ML IJ SOLN
50.0000 ug | Freq: Once | INTRAMUSCULAR | Status: AC
  Administered 2018-01-02: 50 ug via INTRAVENOUS

## 2018-01-02 MED ORDER — PIPERACILLIN-TAZOBACTAM 3.375 G IVPB
3.3750 g | Freq: Three times a day (TID) | INTRAVENOUS | Status: DC
  Administered 2018-01-02 – 2018-01-05 (×11): 3.375 g via INTRAVENOUS
  Filled 2018-01-02 (×13): qty 50

## 2018-01-02 MED ORDER — SODIUM CHLORIDE 0.9 % IV SOLN
INTRAVENOUS | Status: DC
  Administered 2018-01-02 – 2018-01-05 (×5): via INTRAVENOUS

## 2018-01-02 MED ORDER — ASPIRIN 325 MG PO TABS
325.0000 mg | ORAL_TABLET | Freq: Every day | ORAL | Status: DC
  Administered 2018-01-02: 325 mg via ORAL
  Filled 2018-01-02: qty 1

## 2018-01-02 MED ORDER — FAMOTIDINE IN NACL 20-0.9 MG/50ML-% IV SOLN
20.0000 mg | Freq: Two times a day (BID) | INTRAVENOUS | Status: DC
  Administered 2018-01-02 (×3): 20 mg via INTRAVENOUS
  Filled 2018-01-02 (×3): qty 50

## 2018-01-02 MED ORDER — FENTANYL BOLUS VIA INFUSION
50.0000 ug | INTRAVENOUS | Status: DC | PRN
  Administered 2018-01-02 (×4): 50 ug via INTRAVENOUS
  Filled 2018-01-02: qty 50

## 2018-01-02 MED ORDER — HEPARIN BOLUS VIA INFUSION
2000.0000 [IU] | Freq: Once | INTRAVENOUS | Status: AC
  Administered 2018-01-02: 2000 [IU] via INTRAVENOUS
  Filled 2018-01-02: qty 2000

## 2018-01-02 MED ORDER — PROPOFOL 1000 MG/100ML IV EMUL
0.0000 ug/kg/min | INTRAVENOUS | Status: DC
  Administered 2018-01-02 (×2): 20 ug/kg/min via INTRAVENOUS
  Administered 2018-01-03: 30 ug/kg/min via INTRAVENOUS
  Filled 2018-01-02 (×3): qty 100

## 2018-01-02 MED ORDER — HEPARIN BOLUS VIA INFUSION
4000.0000 [IU] | Freq: Once | INTRAVENOUS | Status: AC
  Administered 2018-01-02: 4000 [IU] via INTRAVENOUS
  Filled 2018-01-02: qty 4000

## 2018-01-02 MED ORDER — ONDANSETRON HCL 4 MG/2ML IJ SOLN: 4.0000 mg | Freq: Four times a day (QID) | INTRAMUSCULAR | Status: DC | PRN

## 2018-01-02 MED ORDER — LACTATED RINGERS IV BOLUS (SEPSIS)
1000.0000 mL | Freq: Once | INTRAVENOUS | Status: AC
Start: 2018-01-02 — End: 2018-01-02
  Administered 2018-01-02: 1000 mL via INTRAVENOUS

## 2018-01-02 MED ORDER — LACTATED RINGERS IV BOLUS (SEPSIS)
1000.0000 mL | Freq: Once | INTRAVENOUS | Status: AC
  Administered 2018-01-02: 1000 mL via INTRAVENOUS

## 2018-01-02 MED ORDER — ASPIRIN 81 MG PO CHEW: 324.0000 mg | CHEWABLE_TABLET | ORAL | Status: AC

## 2018-01-02 NOTE — H&P (Signed)
PULMONARY / CRITICAL CARE MEDICINE   Name: Christian Ellison MRN: [id number] DOB: 1960/11/17    ADMISSION DATE:  01/02/2018 CONSULTATION DATE:  01/02/18  REFERRING MD: transfer from [facility]  CHIEF COMPLAINT: Benzo overdose   HISTORY OF PRESENT ILLNESS:   57yoM with hx HCV, Polysubstance abuse, HTN, and Chronic pain, who presented initially to [facility] after becoming unresponsive at home. EMS gave  Narcan without response. Patient was minimally responsive upon arrival to the OSH ER and was intubated for airway protection. Patient's family reported patient has a history of abusing benzos and that his girlfriend overdosed on heroin a few weeks ago. UDS at [facility] was positive for oxycodone and benzos. Patient was then transferred to [facility] where on arrival he was febrile to 101.9, with lactic acidosis climbing from 1.6 to 2.4, elevated procalcitonin, and elevated troponin of 3.25. He was also found to have elevated LFt's and AKI. No family present at time of my exam.   PAST MEDICAL HISTORY :  He  has a past medical history of Chronic pain, Hepatitis, and Opiate abuse, continuous (HCC).  PAST SURGICAL HISTORY: He  has a past surgical history that includes Rotor Cuff (2012); Fracture Right Leg; and Colonoscopy (N/A, 11/22/2013).  No Known Allergies  No current facility-administered medications on file prior to encounter.    Current Outpatient Medications on File Prior to Encounter  Medication Sig  . ALPRAZolam (XANAX) 1 MG tablet Take 1 mg by mouth at bedtime as needed for sleep.   . citalopram (CELEXA) 40 MG tablet Take 40 mg by mouth daily.  Marland Kitchen HYDROcodone-acetaminophen (NORCO) 10-325 MG per tablet Take 1 tablet by mouth as needed. pain  . loperamide (IMODIUM) 2 MG capsule Take 1 capsule (2 mg total) by mouth 4 (four) times daily as needed for diarrhea or loose stools.  . methocarbamol (ROBAXIN) 500 MG tablet Take 2 tablets (1,000 mg total) by mouth 4 (four) times  daily as needed (Pain).  . Multiple Vitamins-Minerals (MULTI FOR HIM 50+ PO) Take by mouth daily.  . Naproxen Sodium (ALEVE PO) Take 2 tablets by mouth as needed. pain  . ondansetron (ZOFRAN) 4 MG tablet Take 1 tablet (4 mg total) by mouth every 8 (eight) hours as needed for nausea or vomiting.  . Oxycodone HCl 10 MG TABS Take 10 mg by mouth 3 (three) times daily.    FAMILY HISTORY:  His indicated that his mother is alive. He indicated that his father is alive. He indicated that both of his sisters are alive. He indicated that his brother is alive. He indicated that his daughter is alive. He indicated that both of his sons are alive.  SOCIAL HISTORY: He  reports that he has never smoked. He has never used smokeless tobacco. He reports that he has current or past drug history. He reports that he does not drink alcohol.  REVIEW OF SYSTEMS:   Review of Systems  Unable to perform ROS: Critical illness   SUBJECTIVE:  Intubated and sedated   VITAL SIGNS: BP 108/78 (BP Location: Left Arm)   Pulse 91   Temp (!) 100.4 F (38 C) (Oral)   Resp 18   Ht  (1.88 m)   Wt 81.8 kg (180 lb 5.4 oz)   SpO2 99%   BMI 23.15 kg/m   VENTILATOR SETTINGS: Vent Mode: PRVC FiO2 (%):  [40 %] 40 % Set Rate:  [18 bmp] 18 bmp Vt Set:  [600 mL] 600 mL PEEP:  [5  cmH20] 5 cmH20 Plateau Pressure:  [17 cmH20-19 cmH20] 17 cmH20  INTAKE / OUTPUT: No intake/output data recorded.  PHYSICAL EXAMINATION: General: WDWN Adult male, intubated and sedated, critically ill Neuro: PERRL, coughs in response to suctioning ETT, grimaces to sternal rub HEENT: Orally intubated, OP clear, MM moist  Cardiovascular: RRR no m/r/g Lungs: CTA b/l Abdomen: Soft NTND Musculoskeletal: no LE edema GU: foley with dark urine  Skin: no rashes   LABS:  BMET Recent Labs  Lab 01/02/18 0209  NA 137  K 4.1  CL 101  CO2 27  BUN 27*  CREATININE 1.22  GLUCOSE 123*   Electrolytes Recent Labs  Lab 01/02/18 0209   CALCIUM 7.8*   CBC Recent Labs  Lab 01/02/18 0209  WBC 10.2  HGB 13.5  HCT 41.1  PLT 154   Coag's Recent Labs  Lab 01/02/18 0209  INR 1.46   Sepsis Markers Recent Labs  Lab 01/02/18 0133 01/02/18 0209 01/02/18 0418  LATICACIDVEN 1.6  --  2.4*  PROCALCITON  --  2.41  --    ABG Recent Labs  Lab 01/02/18 0205  PHART 7.321*  PCO2ART 58.6*  PO2ART 78.9*   Liver Enzymes Recent Labs  Lab 01/02/18 0209  AST 2,905*  ALT 3,475*  ALKPHOS 61  BILITOT 1.0  ALBUMIN 3.2*   Cardiac Enzymes Recent Labs  Lab 01/02/18 0209  TROPONINI 3.25*   Glucose No results for input(s): GLUCAP in the last 168 hours.  Imaging Dg Chest Port 1 View  Result Date: 01/02/2018 CLINICAL DATA:  Endotracheal tube placement. EXAM: PORTABLE CHEST 1 VIEW COMPARISON:  Chest radiograph performed 01/01/2018 FINDINGS: The patient's endotracheal tube is seen ending 7-8 cm above the carina. A right IJ line is noted ending about the cavoatrial junction. The lungs are well-aerated. Peribronchial thickening is noted. Minimal medial right basilar opacity could reflect pneumonia there is no evidence of pleural effusion or pneumothorax. The cardiomediastinal silhouette is within normal limits. No acute osseous abnormalities are seen. IMPRESSION: 1. Endotracheal tube seen ending 7-8 cm above the carina. 2. Minimal medial right basilar airspace opacity could reflect pneumonia. Electronically Signed   By: Roanna Raider M.D.   On: 01/02/2018 02:21   Dg Abd Portable 1v  Result Date: 01/02/2018 CLINICAL DATA:  Orogastric tube placement. EXAM: PORTABLE ABDOMEN - 1 VIEW COMPARISON:  None. FINDINGS: The patient's enteric tube is noted ending overlying the body of the stomach. The visualized bowel gas pattern is grossly unremarkable. No free intra-abdominal air is seen on this provided upright view. The visualized lung bases are grossly clear. No acute osseous abnormalities are seen. IMPRESSION: Enteric tube noted  ending overlying the body of the stomach. Electronically Signed   By: Roanna Raider M.D.   On: 01/02/2018 03:41   CULTURES: Blood cultures (5/19): pending UA (OSH): negative for UTI Sputum culture (5/19): pending  ANTIBIOTICS: Vancomycin 5/19 >> Zosyn 5/18 >>  SIGNIFICANT EVENTS: 5/18: presented to [facility] with AMS requiring intubation, presumed benzo/oxycodone overdose 5/19: transferred to [facility]; found to have aspiration pneumonia  LINES/TUBES: RIJ TLC (from OSH) 5/18 >> ETT 5/18 >> OG tube 5/18 >> Foley catheter 5/18 >>  DISCUSSION: 57yoM with hx Hepatitis, Polysubstance abuse, HTN, and Chronic pain, who presented initially to [facility] after becoming unresponsive at home following a presumed benzo/oxycodone overdose, requiring intubation, now with aspiration pneumonia and AKI.  ASSESSMENT / PLAN:  PULMONARY 1. Acute Hypoxic respiratory failure; Aspiration pneumonia: - CXR on my review shows mild Right basilar infiltrate;  clinically patient is febrile up to 101.9, consistent with aspiration pneumonia - reviewed ABG and made vent changes; reviewed follow-up ABG - CXR with ETT high; ETT was advanced but on follow-up CXR, it is still high. Advance 1cm from 25 to 26cm at the lip  CARDIOVASCULAR 1. Hx HTN: - hold home antihypertensive (hydralazine) since normotensive on sedation  2. NSTEMI: - troponin 3.25; EKG with no ST changes.  - continue to trend troponin - started heparin infusion.  - TTE ordered.   RENAL 1. AKI: - creatinine 1.34 at OSH, now down to 1.22 here; unclear baseline - continue foley catheter, start IVF's, Monitor UOP.   GASTROINTESTINAL 1. Transaminitis; Hx HCV: - significantly elevated AST and ALT with normal Tbili; unclear if due to acute hepatitis from his known HCV, from possible Etoh hepatitis, or other etiology - APAP level negative at OSH and again here - Check Acute hepatitis panel - RUQ Ultrasound - Repeat LFT's daily.    HEMATOLOGIC No active issues; heparin infusion.   INFECTIOUS 1. Severe sepsis due to Aspiration pneumonia: - CXR with mild right basilar infiltrate - procalcitonin high. Lactate 3.1 (at OSH) >> 1.6  >> 2.4; continue to trend lactate - give 30cc/kg IVF bolus - panculture; continue Vanc and Zosyn  ENDOCRINE No active issues; NPO; SSI PRN  NEUROLOGIC 1. Acute Encephalopathy; Benzo and Oxycodone overdose: - presumed overdose per family; UDS positive for oxycodone and benzos at OSH. Positive for only benzos here.  - nonfocal neuro exam.  - if doesn't follow commands on lightening sedation, consider Head CT.    FAMILY  - Updates: no family present at time of my exam - Inter-disciplinary family meet or Palliative Care meeting due by:  01/08/18   60 minutes critical care time  Milana Obey, MD  Pulmonary and Critical Care Medicine [facility] Pager: [phone]  01/02/2018, 6:12 AM

## 2018-01-02 NOTE — Progress Notes (Signed)
ETT advanced from 23 to 26 at the lip per MD order. Patient tolerated fine, ETT patent BBWS confirmed.

## 2018-01-02 NOTE — Progress Notes (Signed)
Advanced ETT per MD verbal order to 26 at the lip.

## 2018-01-02 NOTE — Progress Notes (Signed)
Spoke with Dr. Darrick Penna about vent changes done per ABG MD aware and okay with them. VT 600 RR 18 O2 40%.

## 2018-01-02 NOTE — Progress Notes (Signed)
Pharmacy Antibiotic Note  Christian Ellison is a 57 y.o. male admitted on 01/02/2018 with pneumonia.  Pharmacy has been consulted for vancomycin and zosyn dosing.  Plan: Vancomycin 1500 mg IV x 1 then 1gm IV q12 hours Zosyn 3.375 gm IV q8 hours  Height:  (180.3 cm) Weight: 182 lb 15.7 oz (83 kg)(from historic records, please update) IBW/kg (Calculated) : 75.3  Temp (24hrs), Avg:101.9 F (38.8 C), Min:101.9 F (38.8 C), Max:101.9 F (38.8 C)  No results for input(s): WBC, CREATININE, LATICACIDVEN, VANCOTROUGH, VANCOPEAK, VANCORANDOM, GENTTROUGH, GENTPEAK, GENTRANDOM, TOBRATROUGH, TOBRAPEAK, TOBRARND, AMIKACINPEAK, AMIKACINTROU, AMIKACIN in the last 168 hours.  CrCl cannot be calculated (No order found.).    No Known Allergies   Thank you for allowing pharmacy to be a part of this patient's care.  Talbert Cage P[place]et 01/02/2018 2:07 AM

## 2018-01-02 NOTE — Consult Note (Addendum)
Cardiology Consultation:   Patient ID: Christian Ellison; [id number]; [birth year]   Admit date: 01/02/2018 Date of Consult: 01/02/2018  Primary Care Provider: Redmond School, MD Primary Cardiologist: [facility] to [facility]  Patient Profile:   Christian Ellison is a 57 y.o. male with a hx of polysubstance abuse, HCV and HTNwho is being seen today for the evaluation of elevated troponin at the request of Dr. Jimmey Ralph.  History of Present Illness:   Christian Ellison transferred from [facility]. Patient currently intubated and sedated. Hx obtained from review of chart. Found unresponsive at home. Intubated in ER at [facility]. UDS positive of benzo and narcotics. per note "his girlfriend overdosed on heroin a few weeks ago". Transferred to [facility] for further management. Her noted elevated temp of 101.9. Troponin 3.25>>>2.7. Elevated LFTs. No family at bedside.  Lactic acid 1.6. Abdominal US without acute abnormality.   No prior documented cardiac hx.   Past Medical History:  Diagnosis Date  . Chronic pain   . Hepatitis   . Opiate abuse, continuous ([place])     Past Surgical History:  Procedure Laterality Date  . COLONOSCOPY N/A 11/22/2013   Procedure: COLONOSCOPY;  Surgeon: Rogene Houston, MD;  Location: [facility];  Service: Endoscopy;  Laterality: N/A;  1030  . Fracture Right Leg     Patient has rod/screws in this leg  . Rotor Cuff  2012   Right Shoulder     Inpatient Medications: Scheduled Meds: . aspirin  325 mg Oral Daily  . chlorhexidine gluconate (MEDLINE KIT)  15 mL Mouth Rinse BID  . docusate  100 mg Per Tube BID  . insulin aspart  1-3 Units Subcutaneous Q4H  . mouth rinse  15 mL Mouth Rinse QID   Continuous Infusions: . sodium chloride 10 mL/hr at 01/02/18 1200  . sodium chloride 30 mL/hr at 01/02/18 1200  . sodium chloride    . famotidine (PEPCID) IV Stopped (01/02/18 1032)  . fentaNYL infusion INTRAVENOUS 150 mcg/hr (01/02/18 1239)  . heparin 1,200 Units/hr (01/02/18  1212)  . piperacillin-tazobactam (ZOSYN)  IV Stopped (01/02/18 0940)  . propofol (DIPRIVAN) infusion 10 mcg/kg/min (01/02/18 1207)  . sodium glycerophosphate 0.9% NaCl IVPB 30 mmol (01/02/18 1039)  . vancomycin     PRN Meds: sodium chloride, sodium chloride, fentaNYL, ibuprofen, midazolam, midazolam, ondansetron (ZOFRAN) IV  Allergies:   No Known Allergies  Social History:   Social History   Socioeconomic History  . Marital status: Divorced    Spouse name: Not on file  . Number of children: Not on file  . Years of education: Not on file  . Highest education level: Not on file  Occupational History  . Not on file  Social Needs  . Financial resource strain: Not on file  . Food insecurity:    Worry: Not on file    Inability: Not on file  . Transportation needs:    Medical: Not on file    Non-medical: Not on file  Tobacco Use  . Smoking status: Never Smoker  . Smokeless tobacco: Never Used  Substance and Sexual Activity  . Alcohol use: No    Comment: Patient states that it has been over a year  . Drug use: Yes    Comment: opiates  . Sexual activity: Yes  Lifestyle  . Physical activity:    Days per week: Not on file    Minutes per session: Not on file  . Stress: Not on file  Relationships  . Social connections:  Talks on phone: Not on file    Gets together: Not on file    Attends religious service: Not on file    Active member of club or organization: Not on file    Attends meetings of clubs or organizations: Not on file    Relationship status: Not on file  . Intimate partner violence:    Fear of current or ex partner: Not on file    Emotionally abused: Not on file    Physically abused: Not on file    Forced sexual activity: Not on file  Other Topics Concern  . Not on file  Social History Narrative  . Not on file    Family History:   Family History  Problem Relation Age of Onset  . Breast cancer Mother   . Colon cancer Father   . Bladder Cancer Father     . Prostate cancer Father   . Hypertension Sister   . Hypothyroidism Sister   . Healthy Sister   . Healthy Daughter   . Healthy Son   . Healthy Son      ROS:  Please see the history of present illness.  All other ROS reviewed and negative.     Physical Exam/Data:   Vitals:   01/02/18 1100 01/02/18 1135 01/02/18 1146 01/02/18 1200  BP: 93/69  95/69 (!) 88/60  Pulse:   66   Resp: '18  18 18  ' Temp:  98.5 F (36.9 C)    TempSrc:  Oral    SpO2:   100%   Weight:      Height:        Intake/Output Summary (Last 24 hours) at 01/02/2018 1319 Last data filed at 01/02/2018 1200 Gross per 24 hour  Intake 2152.41 ml  Output 1545 ml  Net 607.41 ml   Filed Weights   01/02/18 0139 01/02/18 0200  Weight: 182 lb 15.7 oz (83 kg) 180 lb 5.4 oz (81.8 kg)   Body mass index is 23.15 kg/m.  General:  Well nourished, well developed  Critically ill male intubated and sedated  HEENT: normal Lymph: no adenopathy Neck: no JVD Endocrine:  No thryomegaly Vascular: No carotid bruits; FA pulses 2+ bilaterally without bruits  Cardiac:  normal S1, S2; RRR; no murmur Lungs:  clear to auscultation bilaterally, no wheezing, rhonchi or rales  Abd: soft, nontender, no hepatomegaly  Ext: no edema Musculoskeletal:  No deformities, BUE and BLE strength normal and equal Skin: warm and dry  Neuro: intubated and sedated no focal abnormalities noted Psych:  Intubated   EKG:  The EKG was personally reviewed and demonstrates:  Sinus tachy with non specific TWI Telemetry:  Telemetry was personally reviewed and demonstrates:  SR at 80s ECG >> STE v1-V3  With LVLL   Relevant CV Studies: Pending echo  Laboratory Data:  Chemistry Recent Labs  Lab 01/02/18 0209 01/02/18 0623  NA 137 139  K 4.1 4.1  CL 101 102  CO2 27 28  GLUCOSE 123* 127*  BUN 27* 24*  CREATININE 1.22 1.07  CALCIUM 7.8* 7.9*  GFRNONAA >60 >60  GFRAA >60 >60  ANIONGAP 9 9    Recent Labs  Lab 01/02/18 0209  PROT 6.5   ALBUMIN 3.2*  AST 2,905*  ALT 3,475*  ALKPHOS 61  BILITOT 1.0   Hematology Recent Labs  Lab 01/02/18 0209 01/02/18 0623  WBC 10.2 9.9  RBC 4.44 4.26  HGB 13.5 12.8*  HCT 41.1 38.5*  MCV 92.6 90.4  MCH 30.4 30.0  MCHC 32.8 33.2  RDW 12.2 12.1  PLT 154 148*   Cardiac Enzymes Recent Labs  Lab 01/02/18 0209 01/02/18 0623  TROPONINI 3.25* 2.70*   No results for input(s): TROPIPOC in the last 168 hours.  BNP Recent Labs  Lab 01/02/18 0209  BNP 310.8*    Radiology/Studies:  Dg Chest Port 1 View  Result Date: 01/02/2018 CLINICAL DATA:  Endotracheal tube advancement. EXAM: PORTABLE CHEST 1 VIEW COMPARISON:  Chest radiograph performed earlier today at 1:47 a.m. FINDINGS: The patient's endotracheal tube is seen ending 4-5 cm above the carina. An enteric tube is noted extending below the diaphragm. The lungs are well-aerated. Mild bibasilar opacities may reflect atelectasis or pneumonia, slightly more prominent than on the prior study. There is no evidence of pleural effusion or pneumothorax. The cardiomediastinal silhouette is within normal limits. No acute osseous abnormalities are seen. IMPRESSION: 1. Endotracheal tube seen ending 4-5 cm above the carina. 2. Mild bibasilar airspace opacities may reflect atelectasis or pneumonia, slightly more prominent than on the prior study. Electronically Signed   By: Garald Balding M.D.   On: 01/02/2018 06:34   Dg Chest Port 1 View  Result Date: 01/02/2018 CLINICAL DATA:  Endotracheal tube placement. EXAM: PORTABLE CHEST 1 VIEW COMPARISON:  Chest radiograph performed 01/01/2018 FINDINGS: The patient's endotracheal tube is seen ending 7-8 cm above the carina. A right IJ line is noted ending about the cavoatrial junction. The lungs are well-aerated. Peribronchial thickening is noted. Minimal medial right basilar opacity could reflect pneumonia there is no evidence of pleural effusion or pneumothorax. The cardiomediastinal silhouette is within  normal limits. No acute osseous abnormalities are seen. IMPRESSION: 1. Endotracheal tube seen ending 7-8 cm above the carina. 2. Minimal medial right basilar airspace opacity could reflect pneumonia. Electronically Signed   By: Garald Balding M.D.   On: 01/02/2018 02:21   Dg Abd Portable 1v  Result Date: 01/02/2018 CLINICAL DATA:  Orogastric tube placement. EXAM: PORTABLE ABDOMEN - 1 VIEW COMPARISON:  None. FINDINGS: The patient's enteric tube is noted ending overlying the body of the stomach. The visualized bowel gas pattern is grossly unremarkable. No free intra-abdominal air is seen on this provided upright view. The visualized lung bases are grossly clear. No acute osseous abnormalities are seen. IMPRESSION: Enteric tube noted ending overlying the body of the stomach. Electronically Signed   By: Garald Balding M.D.   On: 01/02/2018 03:41   US Abdomen Limited Ruq  Result Date: 01/02/2018 CLINICAL DATA:  Hepatitis EXAM: ULTRASOUND ABDOMEN LIMITED RIGHT UPPER QUADRANT COMPARISON:  None. FINDINGS: Gallbladder: No gallstones. The gallbladder mildly distended. No gallbladder wall thickening. No pericholecystic fluid. Murphy sign cannot be assessed due to patient on ventilator. Common bile duct: Diameter: Normal at 3 mm. Liver: No focal lesion identified. Within normal limits in parenchymal echogenicity. Portal vein is patent on color Doppler imaging with normal direction of blood flow towards the liver. IMPRESSION: 1. No cholelithiasis or gallbladder wall thickening. 2. No biliary duct dilatation. 3. Normal liver parenchyma by ultrasound. Electronically Signed   By: Suzy Bouchard M.D.   On: 01/02/2018 08:45    Assessment and Plan:   1. NSTEMI - Likely type II. Troponin trending down 3.25>>2.7. EKG with non specific TWI. Continue IV heparin and ASA (consider reducing to 32m qd). Ischemic evaluation inpatient vs outpatient pending echo and hospital course. c Check CK to see if elevated troponin is due  to rhabdomyolysis or not.   2. Aspiration pneumonia/acute hypoxic respiratory failure - per  PCCM  3. Elevated LFTS with hx of HCV - Unremarkable abdominal US. Repeat labs.   4. Polysubstance abuse - UDS positive of benzo and Oxycodone.   For questions or updates, please contact [place] Please consult [email or web address] for contact info under Cardiology/STEMI.   Jarrett Soho, [place]  01/02/2018 1:19 PM    NSTEMI  Abnormal ECG  + UDS  Aspiration pneumonia with sepsis  ? Rhabdomyolysis   Hypertransaminasemia     Chart and patient examined and the note above modified in accordance with my observations  Will check CK, transaminasemia frequently cooccurs but almost always the ALT < AST which is NOT true here, and this may manifest hepatitis  Will check echo  For now reasonable to continue Heparin, esp in light of the ST elevation V1-V3 but would not pursue catheterization with active comorbid processes.  Will follow troponin, which is declining.   This may all be nonspecific 2/2 rhabdomyolysis or demand from the sepsis syndrome  Will follow  Thanks

## 2018-01-02 NOTE — Progress Notes (Signed)
ANTICOAGULATION CONSULT NOTE - Initial Consult  Pharmacy Consult for heparin Indication: chest pain/ACS  No Known Allergies  Patient Measurements: Height: _0  (188 cm) Weight: 180 lb 5.4 oz (81.8 kg) IBW/kg (Calculated) : 82.2  Vital Signs: Temp: 101.9 F (38.8 C) (05/19 0139) Temp Source: Oral (05/19 0139)  Labs: Recent Labs    01/02/18 0209  HGB 13.5  HCT 41.1  PLT 154  LABPROT 17.6*  INR 1.46  CREATININE 1.22  TROPONINI 3.25*    Estimated Creatinine Clearance: 78.2 mL/min (by C-G formula based on SCr of 1.22 mg/dL).   Medical History: Past Medical History:  Diagnosis Date  . Chronic pain   . Hepatitis   . Opiate abuse, continuous     Medications:  Medications Prior to Admission  Medication Sig Dispense Refill Last Dose  . ALPRAZolam (XANAX) 1 MG tablet Take 1 mg by mouth at bedtime as needed for sleep.    11/21/2013  . citalopram (CELEXA) 40 MG tablet Take 40 mg by mouth daily.   11/21/2013 at Unknown time  . HYDROcodone-acetaminophen (NORCO) 10-325 MG per tablet Take 1 tablet by mouth as needed. pain   11/22/2013 at 0900  . loperamide (IMODIUM) 2 MG capsule Take 1 capsule (2 mg total) by mouth 4 (four) times daily as needed for diarrhea or loose stools. 12 capsule 0   . methocarbamol (ROBAXIN) 500 MG tablet Take 2 tablets (1,000 mg total) by mouth 4 (four) times daily as needed (Pain). 20 tablet 0   . Multiple Vitamins-Minerals (MULTI FOR HIM 50+ PO) Take by mouth daily.   11/21/2013 at Unknown time  . Naproxen Sodium (ALEVE PO) Take 2 tablets by mouth as needed. pain   More than a month at Unknown time  . ondansetron (ZOFRAN) 4 MG tablet Take 1 tablet (4 mg total) by mouth every 8 (eight) hours as needed for nausea or vomiting. 10 tablet 0   . Oxycodone HCl 10 MG TABS Take 10 mg by mouth 3 (three) times daily.    11/21/2013 at Unknown time   Scheduled:  . aspirin  325 mg Oral Daily  . chlorhexidine gluconate (MEDLINE KIT)  15 mL Mouth Rinse BID  . heparin  5,000  Units Subcutaneous Q8H  . mouth rinse  15 mL Mouth Rinse QID   Infusions:  . sodium chloride 250 mL (01/02/18 0301)  . sodium chloride 30 mL/hr at 01/02/18 0233  . famotidine (PEPCID) IV Stopped (01/02/18 0336)  . fentaNYL infusion INTRAVENOUS 100 mcg/hr (01/02/18 0307)  . piperacillin-tazobactam (ZOSYN)  IV    . propofol (DIPRIVAN) infusion    . vancomycin 1,500 mg (01/02/18 0229)  . vancomycin      Assessment: 57yo male transferred from OSH, now w/ elevated troponin, to begin heparin.  Goal of Therapy:  Heparin level 0.3-0.7 units/ml Monitor platelets by anticoagulation protocol: Yes   Plan:  Will give heparin 4000 units IV bolus x1 followed by gtt at 1000 units/hr and monitor heparin levels and CBC.  Wynona Neat, PharmD, BCPS  01/02/2018,3:57 AM

## 2018-01-02 NOTE — Progress Notes (Signed)
ANTICOAGULATION CONSULT NOTE - Follow-Up Consult  Pharmacy Consult for heparin Indication: chest pain/ACS  No Known Allergies  Patient Measurements: Height:  (188 cm) Weight: 180 lb 5.4 oz (81.8 kg) IBW/kg (Calculated) : 82.2  Vital Signs: Temp: 97.7 F (36.5 C) (05/19 1523) Temp Source: Oral (05/19 1523) BP: 85/65 (05/19 1800) Pulse Rate: 69 (05/19 1600)  Labs: Recent Labs    01/02/18 0209 01/02/18 0623 01/02/18 1005 01/02/18 1221 01/02/18 1229 01/02/18 1624 01/02/18 1845  HGB 13.5 12.8*  --   --   --   --   --   HCT 41.1 38.5*  --   --   --   --   --   PLT 154 148*  --   --   --   --   --   LABPROT 17.6*  --   --   --   --   --   --   INR 1.46  --   --   --   --   --   --   HEPARINUNFRC  --   --  <0.10*  --  <0.10*  --  0.11*  CREATININE 1.22 1.07  --   --   --   --   --   CKTOTAL  --   --   --   --   --  66  --   TROPONINI 3.25* 2.70*  --  1.48*  --   --   --     Estimated Creatinine Clearance: 89.2 mL/min (by C-G formula based on SCr of 1.07 mg/dL).   Assessment: 57yo male transferred from OSH for benzodiazepene overdose. Troponins were elevated and suspect possible NSTEMI. Troponins 3.25>>2.70.   Heparin level 0.11  Goal of Therapy:  Heparin level 0.3-0.7 units/ml Monitor platelets by anticoagulation protocol: Yes   Plan:  Heparin 2000 units iv bolus x 1 Increase heparin drip to 1400 units/hr 6 hour heparin level Daily heparin level/CBC  Thank you Okey Regal, PharmD [phone] 01/02/2018,7:34 PM

## 2018-01-02 NOTE — Progress Notes (Signed)
Received patient from critical care transport, placed on vent at this time. Tolerating well, RCP will continue to monitor.

## 2018-01-02 NOTE — Progress Notes (Signed)
ANTICOAGULATION CONSULT NOTE - Follow-Up Consult  Pharmacy Consult for heparin Indication: chest pain/ACS  No Known Allergies  Patient Measurements: Height:  (188 cm) Weight: 180 lb 5.4 oz (81.8 kg) IBW/kg (Calculated) : 82.2  Vital Signs: Temp: 98.5 F (36.9 C) (05/19 1135) Temp Source: Oral (05/19 1135) BP: 95/69 (05/19 1146) Pulse Rate: 66 (05/19 1146)  Labs: Recent Labs    01/02/18 0209 01/02/18 0623 01/02/18 1005  HGB 13.5 12.8*  --   HCT 41.1 38.5*  --   PLT 154 148*  --   LABPROT 17.6*  --   --   INR 1.46  --   --   HEPARINUNFRC  --   --  <0.10*  CREATININE 1.22 1.07  --   TROPONINI 3.25* 2.70*  --     Estimated Creatinine Clearance: 89.2 mL/min (by C-G formula based on SCr of 1.07 mg/dL).   Assessment: 57yo male transferred from OSH for benzodiazepene overdose. Troponins were elevated and suspect possible NSTEMI. Troponins 3.25>>2.70. Initial heparin level is low <0.10. H/H down slightly but platelets stable. No signs of bleeding noted. Spoke with nurse and she notes that heparin drip has not been off at all this morning but did have something running with it. This issue was resolved around 11 AM. Given this information, we will increase the drip conservatively and re-check a level in 6 hours.   Goal of Therapy:  Heparin level 0.3-0.7 units/ml Monitor platelets by anticoagulation protocol: Yes   Plan:  Increase heparin drip to 1200 units/hr 6 hour heparin level Daily heparin level/CBC  Sharin Mons, PharmD, BCPS PGY2 Infectious Diseases Pharmacy Resident Pager: [phone]  01/02/2018,11:51 AM

## 2018-01-02 NOTE — Progress Notes (Signed)
PULMONARY / CRITICAL CARE MEDICINE   Name: Christian Ellison MRN: [id number] DOB: May 30, 1961    ADMISSION DATE:  01/02/2018 CONSULTATION DATE:  01/02/18  REFERRING MD: transfer from [facility]  CHIEF COMPLAINT: Benzo overdose   HISTORY OF PRESENT ILLNESS:   57yoM with hx HCV, Polysubstance abuse, HTN, and Chronic pain, who presented initially to [facility] after becoming unresponsive at home. EMS gave  Narcan without response. Patient was minimally responsive upon arrival to the OSH ER and was intubated for airway protection. Patient's family reported patient has a history of abusing benzos and that his girlfriend overdosed on heroin a few weeks ago. UDS at [facility] was positive for oxycodone and benzos. Patient was then transferred to [facility] where on arrival he was febrile to 101.9, with lactic acidosis climbing from 1.6 to 2.4, elevated procalcitonin, and elevated troponin of 3.25. He was also found to have elevated LFt's and AKI. No family present at time of my exam.   SUBJECTIVE:  Sedated and intubated No events overnight No new complaints  VITAL SIGNS: BP (!) 88/60   Pulse 66   Temp 98.5 F (36.9 C) (Oral)   Resp 18   Ht  (1.88 m)   Wt 180 lb 5.4 oz (81.8 kg)   SpO2 100%   BMI 23.15 kg/m   VENTILATOR SETTINGS: Vent Mode: PRVC FiO2 (%):  [40 %-50 %] 50 % Set Rate:  [18 bmp] 18 bmp Vt Set:  [600 mL] 600 mL PEEP:  [5 cmH20] 5 cmH20 Plateau Pressure:  [17 cmH20-20 cmH20] 18 cmH20  INTAKE / OUTPUT: I/O last 3 completed shifts: In: 1779.5 [I.V.:279.5; IV Piggyback:1500] Out: 550 [Urine:550]  PHYSICAL EXAMINATION: General: Adult male, sedated and intubated, acutely ill appearing Neuro: Unresponsive, not breathing over the vent, withdraws to pain HEENT: [place]/AT, PERRL, EOM-I and MMM Cardiovascular: RRR, Nl S1/S2 and -M/R/G Lungs: CTA bilaterally Abdomen: Soft, NT, ND and +BS Musculoskeletal: -edema and -tenderness GU: foley with dark urine   Skin: no rashes   LABS:  BMET Recent Labs  Lab 01/02/18 0209 01/02/18 0623  NA 137 139  K 4.1 4.1  CL 101 102  CO2 27 28  BUN 27* 24*  CREATININE 1.22 1.07  GLUCOSE 123* 127*   Electrolytes Recent Labs  Lab 01/02/18 0209 01/02/18 0623  CALCIUM 7.8* 7.9*  MG  --  2.1  PHOS  --  1.8*   CBC Recent Labs  Lab 01/02/18 0209 01/02/18 0623  WBC 10.2 9.9  HGB 13.5 12.8*  HCT 41.1 38.5*  PLT 154 148*   Coag's Recent Labs  Lab 01/02/18 0209  INR 1.46   Sepsis Markers Recent Labs  Lab 01/02/18 0209 01/02/18 0418 01/02/18 0623 01/02/18 1005  LATICACIDVEN  --  2.4* 1.8 1.6  PROCALCITON 2.41  --   --   --    ABG Recent Labs  Lab 01/02/18 0205 01/02/18 0621  PHART 7.321* 7.428  PCO2ART 58.6* 39.7  PO2ART 78.9* 47.0*   Liver Enzymes Recent Labs  Lab 01/02/18 0209  AST 2,905*  ALT 3,475*  ALKPHOS 61  BILITOT 1.0  ALBUMIN 3.2*   Cardiac Enzymes Recent Labs  Lab 01/02/18 0209 01/02/18 0623  TROPONINI 3.25* 2.70*   Glucose Recent Labs  Lab 01/02/18 0800 01/02/18 1131  GLUCAP 109* 130*   Imaging Dg Chest Port 1 View  Result Date: 01/02/2018 CLINICAL DATA:  Endotracheal tube advancement. EXAM: PORTABLE CHEST 1 VIEW COMPARISON:  Chest radiograph performed earlier today at 1:47 a.m.  FINDINGS: The patient's endotracheal tube is seen ending 4-5 cm above the carina. An enteric tube is noted extending below the diaphragm. The lungs are well-aerated. Mild bibasilar opacities may reflect atelectasis or pneumonia, slightly more prominent than on the prior study. There is no evidence of pleural effusion or pneumothorax. The cardiomediastinal silhouette is within normal limits. No acute osseous abnormalities are seen. IMPRESSION: 1. Endotracheal tube seen ending 4-5 cm above the carina. 2. Mild bibasilar airspace opacities may reflect atelectasis or pneumonia, slightly more prominent than on the prior study. Electronically Signed   By: Roanna Raider M.D.   On:  01/02/2018 06:34   Dg Chest Port 1 View  Result Date: 01/02/2018 CLINICAL DATA:  Endotracheal tube placement. EXAM: PORTABLE CHEST 1 VIEW COMPARISON:  Chest radiograph performed 01/01/2018 FINDINGS: The patient's endotracheal tube is seen ending 7-8 cm above the carina. A right IJ line is noted ending about the cavoatrial junction. The lungs are well-aerated. Peribronchial thickening is noted. Minimal medial right basilar opacity could reflect pneumonia there is no evidence of pleural effusion or pneumothorax. The cardiomediastinal silhouette is within normal limits. No acute osseous abnormalities are seen. IMPRESSION: 1. Endotracheal tube seen ending 7-8 cm above the carina. 2. Minimal medial right basilar airspace opacity could reflect pneumonia. Electronically Signed   By: Roanna Raider M.D.   On: 01/02/2018 02:21   Dg Abd Portable 1v  Result Date: 01/02/2018 CLINICAL DATA:  Orogastric tube placement. EXAM: PORTABLE ABDOMEN - 1 VIEW COMPARISON:  None. FINDINGS: The patient's enteric tube is noted ending overlying the body of the stomach. The visualized bowel gas pattern is grossly unremarkable. No free intra-abdominal air is seen on this provided upright view. The visualized lung bases are grossly clear. No acute osseous abnormalities are seen. IMPRESSION: Enteric tube noted ending overlying the body of the stomach. Electronically Signed   By: Roanna Raider M.D.   On: 01/02/2018 03:41   US Abdomen Limited Ruq  Result Date: 01/02/2018 CLINICAL DATA:  Hepatitis EXAM: ULTRASOUND ABDOMEN LIMITED RIGHT UPPER QUADRANT COMPARISON:  None. FINDINGS: Gallbladder: No gallstones. The gallbladder mildly distended. No gallbladder wall thickening. No pericholecystic fluid. Murphy sign cannot be assessed due to patient on ventilator. Common bile duct: Diameter: Normal at 3 mm. Liver: No focal lesion identified. Within normal limits in parenchymal echogenicity. Portal vein is patent on color Doppler imaging with  normal direction of blood flow towards the liver. IMPRESSION: 1. No cholelithiasis or gallbladder wall thickening. 2. No biliary duct dilatation. 3. Normal liver parenchyma by ultrasound. Electronically Signed   By: Genevive Bi M.D.   On: 01/02/2018 08:45   CULTURES: Blood cultures (5/19): pending UA (OSH): negative for UTI Sputum culture (5/19): pending  ANTIBIOTICS: Vancomycin 5/19 >> Zosyn 5/18 >>  SIGNIFICANT EVENTS: 5/18: presented to [facility] with AMS requiring intubation, presumed benzo/oxycodone overdose 5/19: transferred to [facility]; found to have aspiration pneumonia  LINES/TUBES: RIJ TLC (from OSH) 5/18 >> ETT 5/18 >> OG tube 5/18 >> Foley catheter 5/18 >>  DISCUSSION: 57yoM with hx Hepatitis, Polysubstance abuse, HTN, and Chronic pain, who presented initially to [facility] after becoming unresponsive at home following a presumed benzo/oxycodone overdose, requiring intubation, now with aspiration pneumonia and AKI.  ASSESSMENT / PLAN:  PULMONARY 1. Acute Hypoxic respiratory failure; Aspiration pneumonia: - Abx as above for aspiration pneumonia, continue for now - Adjust vent for ABG - CXR and ABG in AM - Titrate O2 for sat of 88-92%  CARDIOVASCULAR 1. Hx HTN: - Hold  home anti-HTN for now  2. NSTEMI: - Troponin 3.25; EKG with no ST changes.  - Trend troponins - Heparin infusion - TTE pending  RENAL 1. AKI: - BMET in AM - Hydrate - Replace electrolytes as intubated  GASTROINTESTINAL 1. Transaminitis; Hx HCV: - Significantly elevated AST and ALT with normal Tbili; unclear if due to acute hepatitis from his known HCV, from possible Etoh hepatitis, or other etiology - APAP level negative at OSH and again here - Check Acute hepatitis panel - RUQ Ultrasound - Repeat LFT's daily.   HEMATOLOGIC No active issues; heparin infusion.   INFECTIOUS 1. Severe sepsis due to Aspiration pneumonia: - CXR with mild right basilar infiltrate -  Procalcitonin high. Lactate 3.1 (at OSH) >> 1.6  >> 2.4; continue to trend lactate - F/u on cultures - Vanc and zosyn  ENDOCRINE No active issues; NPO; SSI PRN  NEUROLOGIC 1. Acute Encephalopathy; Benzo and Oxycodone overdose: - Presumed overdose per family; UDS positive for oxycodone and benzos at OSH. Positive for only benzos here.  - Minimize sedation - Head CT ordered  FAMILY  - Updates: No family bedside 5/19 - Inter-disciplinary family meet or Palliative Care meeting due by:  01/08/18  The patient is critically ill with multiple organ systems failure and requires high complexity decision making for assessment and support, frequent evaluation and titration of therapies, application of advanced monitoring technologies and extensive interpretation of multiple databases.   Critical Care Time devoted to patient care services described in this note is  35  Minutes. This time reflects time of care of this signee Dr Koren Bound. This critical care time does not reflect procedure time, or teaching time or supervisory time of PA/NP/Med student/Med Resident etc but could involve care discussion time.  Alyson Reedy, M.D. [facility] Pulmonary/Critical Care Medicine. Pager: [phone]. After hours pager: [phone].  01/02/2018, 1:48 PM

## 2018-01-03 ENCOUNTER — Inpatient Hospital Stay (HOSPITAL_COMMUNITY): Payer: Self-pay

## 2018-01-03 DIAGNOSIS — I248 Other forms of acute ischemic heart disease: Secondary | ICD-10-CM

## 2018-01-03 DIAGNOSIS — T50904D Poisoning by unspecified drugs, medicaments and biological substances, undetermined, subsequent encounter: Secondary | ICD-10-CM

## 2018-01-03 DIAGNOSIS — J9601 Acute respiratory failure with hypoxia: Secondary | ICD-10-CM

## 2018-01-03 DIAGNOSIS — R001 Bradycardia, unspecified: Secondary | ICD-10-CM

## 2018-01-03 LAB — BASIC METABOLIC PANEL
Anion gap: 10 (ref 5–15)
Anion gap: 6 (ref 5–15)
BUN: 14 mg/dL (ref 6–20)
BUN: 8 mg/dL (ref 6–20)
CALCIUM: 7.7 mg/dL — AB (ref 8.9–10.3)
CALCIUM: 7.5 mg/dL — AB (ref 8.9–10.3)
CHLORIDE: 105 mmol/L (ref 101–111)
CO2: 26 mmol/L (ref 22–32)
CO2: 27 mmol/L (ref 22–32)
CREATININE: 0.83 mg/dL (ref 0.61–1.24)
Chloride: 108 mmol/L (ref 101–111)
Creatinine, Ser: 0.72 mg/dL (ref 0.61–1.24)
GFR calc Af Amer: >60 mL/min (ref >60)
GFR calc Af Amer: >60 mL/min (ref >60)
GFR calc non Af Amer: >60 mL/min (ref >60)
GLUCOSE: 133 mg/dL — AB (ref 65–99)
GLUCOSE: 127 mg/dL — AB (ref 65–99)
Potassium: 2.9 mmol/L — ABNORMAL LOW (ref 3.5–5.1)
Potassium: 3.5 mmol/L (ref 3.5–5.1)
Sodium: 141 mmol/L (ref 135–145)
Sodium: 141 mmol/L (ref 135–145)

## 2018-01-03 LAB — CBC
HCT: 34.3 % — ABNORMAL LOW (ref 39.0–52.0)
Hemoglobin: 11.6 g/dL — ABNORMAL LOW (ref 13.0–17.0)
MCH: 30.7 pg (ref 26.0–34.0)
MCHC: 33.8 g/dL (ref 30.0–36.0)
MCV: 90.7 fL (ref 78.0–100.0)
PLATELETS: 175 10*3/uL (ref 150–400)
RBC: 3.78 MIL/uL — ABNORMAL LOW (ref 4.22–5.81)
RDW: 12.1 % (ref 11.5–15.5)
WBC: 9.3 10*3/uL (ref 4.0–10.5)

## 2018-01-03 LAB — MRSA CULTURE: Culture: NOT DETECTED

## 2018-01-03 LAB — HEPARIN LEVEL (UNFRACTIONATED)
HEPARIN UNFRACTIONATED: 0.26 [IU]/mL — AB (ref 0.30–0.70)
HEPARIN UNFRACTIONATED: 0.27 [IU]/mL — AB (ref 0.30–0.70)
Heparin Unfractionated: 0.29 IU/mL — ABNORMAL LOW (ref 0.30–0.70)

## 2018-01-03 LAB — GLUCOSE, CAPILLARY
GLUCOSE-CAPILLARY: 104 mg/dL — AB (ref 65–99)
GLUCOSE-CAPILLARY: 61 mg/dL — AB (ref 65–99)
GLUCOSE-CAPILLARY: 75 mg/dL (ref 65–99)
GLUCOSE-CAPILLARY: 80 mg/dL (ref 65–99)
Glucose-Capillary: 138 mg/dL — ABNORMAL HIGH (ref 65–99)
Glucose-Capillary: 117 mg/dL — ABNORMAL HIGH (ref 65–99)
Glucose-Capillary: 89 mg/dL (ref 65–99)
Glucose-Capillary: 68 mg/dL (ref 65–99)

## 2018-01-03 LAB — BLOOD GAS, ARTERIAL
ACID-BASE EXCESS: 3.4 mmol/L — AB (ref 0.0–2.0)
Bicarbonate: 26.5 mmol/L (ref 20.0–28.0)
DRAWN BY: 511911
FIO2: 50
O2 SAT: 98.3 %
PATIENT TEMPERATURE: 98.8
PCO2 ART: 34.4 mmHg (ref 32.0–48.0)
PEEP/CPAP: 5 cmH2O
PH ART: 7.498 — AB (ref 7.350–7.450)
RATE: 18 resp/min
VT: 600 mL
pO2, Arterial: 105 mmHg (ref 83.0–108.0)

## 2018-01-03 LAB — HEPATITIS PANEL, ACUTE
HCV Ab: >11 s/co ratio — ABNORMAL HIGH (ref 0.0–0.9)
HEP B C IGM: NEGATIVE
Hep A IgM: NEGATIVE
Hepatitis B Surface Ag: NEGATIVE

## 2018-01-03 LAB — ECHOCARDIOGRAM COMPLETE
HEIGHTINCHES: 74 in
WEIGHTICAEL: 2952.4 [oz_av]

## 2018-01-03 LAB — MAGNESIUM: Magnesium: 2 mg/dL (ref 1.7–2.4)

## 2018-01-03 LAB — PHOSPHORUS
PHOSPHORUS: 1.5 mg/dL — AB (ref 2.5–4.6)
Phosphorus: 4.4 mg/dL (ref 2.5–4.6)

## 2018-01-03 MED ORDER — ASPIRIN 325 MG PO TABS: 325.0000 mg | ORAL_TABLET | Freq: Every day | ORAL | Status: DC

## 2018-01-03 MED ORDER — ASPIRIN 325 MG PO TABS
325.0000 mg | ORAL_TABLET | Freq: Every day | ORAL | Status: DC
  Administered 2018-01-03: 325 mg
  Filled 2018-01-03: qty 1

## 2018-01-03 MED ORDER — PHENYLEPHRINE HCL-NACL 10-0.9 MG/250ML-% IV SOLN
0.0000 ug/min | INTRAVENOUS | Status: DC
  Administered 2018-01-03: 20 ug/min via INTRAVENOUS
  Filled 2018-01-03 (×2): qty 250

## 2018-01-03 MED ORDER — NOREPINEPHRINE 4 MG/250ML-% IV SOLN
0.0000 ug/min | INTRAVENOUS | Status: DC
  Administered 2018-01-03: 4 ug/min via INTRAVENOUS
  Filled 2018-01-03 (×3): qty 250

## 2018-01-03 MED ORDER — FAMOTIDINE 40 MG/5ML PO SUSR
20.0000 mg | Freq: Two times a day (BID) | ORAL | Status: DC
  Administered 2018-01-03: 20 mg via ORAL

## 2018-01-03 MED ORDER — IBUPROFEN 100 MG/5ML PO SUSP
600.0000 mg | Freq: Three times a day (TID) | ORAL | Status: DC | PRN
Start: 2018-01-03 — End: 2018-01-06
  Filled 2018-01-03: qty 30

## 2018-01-03 MED ORDER — POTASSIUM PHOSPHATES 15 MMOLE/5ML IV SOLN
30.0000 mmol | Freq: Once | INTRAVENOUS | Status: AC
  Administered 2018-01-03: 30 mmol via INTRAVENOUS
  Filled 2018-01-03: qty 10

## 2018-01-03 MED ORDER — FAMOTIDINE 40 MG/5ML PO SUSR
20.0000 mg | Freq: Two times a day (BID) | ORAL | Status: DC
  Administered 2018-01-03: 20 mg
  Filled 2018-01-03 (×2): qty 2.5

## 2018-01-03 MED ORDER — DOCUSATE SODIUM 50 MG/5ML PO LIQD
100.0000 mg | Freq: Two times a day (BID) | ORAL | Status: DC
  Administered 2018-01-03: 100 mg via ORAL

## 2018-01-03 NOTE — Progress Notes (Signed)
ELINK notified of K+=2.9 and Phos=1.5. Will continue to monitor.

## 2018-01-03 NOTE — Progress Notes (Signed)
ANTICOAGULATION CONSULT NOTE  Pharmacy Consult for heparin Indication: chest pain/ACS  No Known Allergies  Patient Measurements: Height:  (188 cm) Weight: 184 lb 8.4 oz (83.7 kg) IBW/kg (Calculated) : 82.2  Vital Signs: Temp: 98.4 F (36.9 C) (05/20 1455) Temp Source: Oral (05/20 1455) BP: 170/105 (05/20 1700) Pulse Rate: 80 (05/20 1800)  Labs: Recent Labs    01/02/18 0209 01/02/18 0623  01/02/18 1221  01/02/18 1624  01/02/18 1934 01/03/18 0243 01/03/18 0435 01/03/18 1047 01/03/18 1314 01/03/18 1843  HGB 13.5 12.8*  --   --   --   --   --   --   --  11.6*  --   --   --   HCT 41.1 38.5*  --   --   --   --   --   --   --  34.3*  --   --   --   PLT 154 148*  --   --   --   --   --   --   --  175  --   --   --   LABPROT 17.6*  --   --   --   --   --   --   --   --   --   --   --   --   INR 1.46  --   --   --   --   --   --   --   --   --   --   --   --   HEPARINUNFRC  --   --    < >  --    < >  --    < >  --  0.26*  --  0.27*  --  0.29*  CREATININE 1.22 1.07  --   --   --   --   --   --   --  0.83  --  0.72  --   CKTOTAL  --   --   --   --   --  66  --   --   --   --   --   --   --   TROPONINI 3.25* 2.70*  --  1.48*  --   --   --  0.96*  --   --   --   --   --    < > = values in this interval not displayed.    Estimated Creatinine Clearance: 119.9 mL/min (by C-G formula based on SCr of 0.72 mg/dL).   Assessment: 57yo male transferred from OSH for benzodiazepene overdose. Troponins were elevated and suspect possible NSTEMI on IV Heparin.    Heparin level remains sub-therapeutic despite rate increase.  Goal of Therapy:  Heparin level 0.3-0.7 units/ml Monitor platelets by anticoagulation protocol: Yes   Plan:  Increase heparin drip to 1800 units/hr Daily heparin level/CBC Going on 48 hour duration, consider discontinuing on 5/21   Baldemar Friday 01/03/2018 7:20 PM

## 2018-01-03 NOTE — Progress Notes (Signed)
Initial Nutrition Assessment  DOCUMENTATION CODES:   Not applicable  INTERVENTION:   -Diet progression post extubation  -Assess need for supplementation on follow-up  NUTRITION DIAGNOSIS:   Inadequate oral intake related to acute illness as evidenced by NPO status.  GOAL:   Patient will meet greater than or equal to 90% of their needs  MONITOR:   Diet advancement, PO intake, Labs, Weight trends  REASON FOR ASSESSMENT:   Consult, Ventilator Enteral/tube feeding initiation and management  ASSESSMENT:   57 yo male admitted with post benzo/oxycodone overdose with acute respiratory failure and sepsis due to aspiration pneumonia, NSTEMI, AKI ; pt with hx of HCV, polysubstance abuse, HTN, chronic pain.   Extubated today, TF not initiated NPO at present  Per weight encounters, pt with no weight loss recently. Weight appears relatively stable over the past several years  Hypokalemia, hypophosphatemia being addressed via supplementation  OG with 350 mL out, removed with extubation  Labs: potassium 2.9, elevated LFTs, phosphorus 1.5 Meds: levophed, fentanyl, diprivan  NUTRITION - FOCUSED PHYSICAL EXAM:  Unable to assess  Diet Order:   Diet Order           Diet NPO time specified  Diet effective now          EDUCATION NEEDS:   Not appropriate for education at this time  Skin:  Skin Assessment: Reviewed RN Assessment  Last BM:  no documented BM  Height:   Ht Readings from Last 1 Encounters:  01/02/18  (1.88 m)    Weight:   Wt Readings from Last 1 Encounters:  01/03/18 184 lb 8.4 oz (83.7 kg)    Ideal Body Weight:  86.4 kg  BMI:  Body mass index is 23.69 kg/m.  Estimated Nutritional Needs:   Kcal:  1900-2260 kcals  Protein:  95-115 g  Fluid:  >= 2 L   Romelle Starcher MS, RD, LDN, CNSC [phone] Pager  [phone] Weekend/On-Call Pager

## 2018-01-03 NOTE — Progress Notes (Signed)
ANTICOAGULATION CONSULT NOTE - Follow-Up Consult  Pharmacy Consult for heparin Indication: chest pain/ACS  No Known Allergies  Patient Measurements: Height:  (188 cm) Weight: 184 lb 8.4 oz (83.7 kg) IBW/kg (Calculated) : 82.2  Vital Signs: Temp: 100 F (37.8 C) (05/19 2345) Temp Source: Oral (05/19 2345) BP: 100/60 (05/20 0145) Pulse Rate: 57 (05/20 0145)  Labs: Recent Labs    01/02/18 0209 01/02/18 0623  01/02/18 1221 01/02/18 1229 01/02/18 1624 01/02/18 1845 01/02/18 1934 01/03/18 0243  HGB 13.5 12.8*  --   --   --   --   --   --   --   HCT 41.1 38.5*  --   --   --   --   --   --   --   PLT 154 148*  --   --   --   --   --   --   --   LABPROT 17.6*  --   --   --   --   --   --   --   --   INR 1.46  --   --   --   --   --   --   --   --   HEPARINUNFRC  --   --    < >  --  <0.10*  --  0.11*  --  0.26*  CREATININE 1.22 1.07  --   --   --   --   --   --   --   CKTOTAL  --   --   --   --   --  66  --   --   --   TROPONINI 3.25* 2.70*  --  1.48*  --   --   --  0.96*  --    < > = values in this interval not displayed.    Estimated Creatinine Clearance: 89.6 mL/min (by C-G formula based on SCr of 1.07 mg/dL).   Assessment: 57yo male transferred from OSH for benzodiazepene overdose. Troponins were elevated and suspect possible NSTEMI.   Heparin level 0.26  Goal of Therapy:  Heparin level 0.3-0.7 units/ml Monitor platelets by anticoagulation protocol: Yes   Plan:  Increase heparin drip to 1500 units/hr 6 hour heparin level to confirm Daily heparin level/CBC  Thank you Talbert Cage PharmD 01/03/2018,3:38 AM

## 2018-01-03 NOTE — Progress Notes (Signed)
Hypoglycemic Event  CBG: 68 -> 61  Treatment: 15 GM carbohydrate snack x2  Symptoms: None  Follow-up CBG: Time: 2016 CBG Result: 80  Possible Reasons for Event: Inadequate meal intake  Comments/MD notified:    Della Goo

## 2018-01-03 NOTE — Progress Notes (Signed)
Progress Note  Patient Name: Christian Ellison Date of Encounter: 01/03/2018  Primary Cardiologist: Skeet Latch, MD (new)  Subjective   Denies chest pain or discomfort.  Reports a family history of premature CAD.  Inpatient Medications    Scheduled Meds: . aspirin  325 mg Per Tube Daily  . chlorhexidine gluconate (MEDLINE KIT)  15 mL Mouth Rinse BID  . docusate  100 mg Per Tube BID  . famotidine  20 mg Per Tube BID  . insulin aspart  1-3 Units Subcutaneous Q4H  . mouth rinse  15 mL Mouth Rinse QID   Continuous Infusions: . sodium chloride 75 mL/hr at 01/03/18 0800  . sodium chloride    . fentaNYL infusion INTRAVENOUS Stopped (01/03/18 0830)  . heparin 1,500 Units/hr (01/03/18 0800)  . norepinephrine (LEVOPHED) Adult infusion Stopped (01/03/18 0844)  . piperacillin-tazobactam (ZOSYN)  IV 3.375 g (01/03/18 0546)  . potassium PHOSPHATE IVPB (mmol) 30 mmol (01/03/18 0741)  . propofol (DIPRIVAN) infusion Stopped (01/03/18 0830)   PRN Meds: sodium chloride, fentaNYL, ibuprofen, midazolam, midazolam, ondansetron (ZOFRAN) IV   Vital Signs    Vitals:   01/03/18 0754 01/03/18 0755 01/03/18 0800 01/03/18 0845  BP:   103/66   Pulse:  (!) 59 (!) 54   Resp:  19 18   Temp: (!) 97.5 F (36.4 C)     TempSrc: Oral     SpO2:  100% 100% 100%  Weight:      Height:        Intake/Output Summary (Last 24 hours) at 01/03/2018 0916 Last data filed at 01/03/2018 0800 Gross per 24 hour  Intake 3277.99 ml  Output 2135 ml  Net 1142.99 ml   Filed Weights   01/02/18 0200 01/02/18 2330 01/03/18 0440  Weight: 180 lb 5.4 oz (81.8 kg) 184 lb 8.4 oz (83.7 kg) 184 lb 8.4 oz (83.7 kg)    Telemetry    Sinus rhythm.  Sinus bradycardia.  Rate 40s-70s.  - Personally Reviewed  ECG    Sinus tachycardia.  Rate 107 bpm. PAC.  Non-specific ST changes.  Low voltage limb leads. - Personally Reviewed  Physical Exam   VS:  BP 103/66 (BP Location: Left Arm)   Pulse (!) 54   Temp (!) 97.5 F  (36.4 C) (Oral)   Resp 18   Ht '6\' 2"'  (1.88 m)   Wt 184 lb 8.4 oz (83.7 kg)   SpO2 100%   BMI 23.69 kg/m   , BMI Body mass index is 23.69 kg/m. GENERAL:  Critically ill-appearing.  Awake and un-sedated on vent.   HEENT: Pupils equal round and reactive, fundi not visualized, oral mucosa unremarkable NECK:  No jugular venous distention, waveform within normal limits, carotid upstroke brisk and symmetric, no bruits LUNGS:  Clear to auscultation bilaterally on anterior exam.  HEART:  RRR.  PMI not displaced or sustained,S1 and S2 within normal limits, no S3, no S4, no clicks, no rubs, no murmurs ABD:  Flat, positive bowel sounds normal in frequency in pitch, no bruits, no rebound, no guarding, no midline pulsatile mass, no hepatomegaly, no splenomegaly EXT:  2 plus pulses throughout, +RUE edema, no cyanosis no clubbing SKIN:  No rashes no nodules NEURO:  Moves all 4 extremities freely. PSYCH:  Unable to assess.    Labs    Chemistry Recent Labs  Lab 01/02/18 0209 01/02/18 0623 01/03/18 0435  NA 137 139 141  K 4.1 4.1 2.9*  CL 101 102 105  CO2 27 28 26  GLUCOSE 123* 127* 133*  BUN 27* 24* 14  CREATININE 1.22 1.07 0.83  CALCIUM 7.8* 7.9* 7.7*  PROT 6.5  --  5.2*  ALBUMIN 3.2*  --  2.7*  AST 2,905*  --  1,011*  ALT 3,475*  --  2,516*  ALKPHOS 61  --  37  BILITOT 1.0  --  1.4*  GFRNONAA >60 >60 >60  GFRAA >60 >60 >60  ANIONGAP '9 9 10     ' Hematology Recent Labs  Lab 01/02/18 0209 01/02/18 0623 01/03/18 0435  WBC 10.2 9.9 9.3  RBC 4.44 4.26 3.78*  HGB 13.5 12.8* 11.6*  HCT 41.1 38.5* 34.3*  MCV 92.6 90.4 90.7  MCH 30.4 30.0 30.7  MCHC 32.8 33.2 33.8  RDW 12.2 12.1 12.1  PLT 154 148* 175    Cardiac Enzymes Recent Labs  Lab 01/02/18 0209 01/02/18 0623 01/02/18 1221 01/02/18 1934  TROPONINI 3.25* 2.70* 1.48* 0.96*   No results for input(s): TROPIPOC in the last 168 hours.   BNP Recent Labs  Lab 01/02/18 0209  BNP 310.8*     DDimer No results for  input(s): DDIMER in the last 168 hours.   Radiology    Dg Chest Port 1 View  Result Date: 01/03/2018 CLINICAL DATA:  Respiratory failure, short of breath EXAM: PORTABLE CHEST 1 VIEW COMPARISON:  01/02/2018, 01/01/2018 FINDINGS: Endotracheal tube tip is about 3 cm superior to the carina. Esophageal tube tip is below the diaphragm but not included. Right IJ central venous catheter tip overlies the proximal right atrium. Increasing bibasilar opacity, atelectasis versus infiltrates. Slight increased vascular congestion. Stable heart size. No pneumothorax. IMPRESSION: 1. Endotracheal tube tip about 3 cm superior to carina 2. Increasing airspace disease at the lung bases, atelectasis versus pneumonia 3. Slight increased vascular congestion Electronically Signed   By: Donavan Foil M.D.   On: 01/03/2018 03:18   Dg Chest Port 1 View  Result Date: 01/02/2018 CLINICAL DATA:  Endotracheal tube advancement. EXAM: PORTABLE CHEST 1 VIEW COMPARISON:  Chest radiograph performed earlier today at 1:47 a.m. FINDINGS: The patient's endotracheal tube is seen ending 4-5 cm above the carina. An enteric tube is noted extending below the diaphragm. The lungs are well-aerated. Mild bibasilar opacities may reflect atelectasis or pneumonia, slightly more prominent than on the prior study. There is no evidence of pleural effusion or pneumothorax. The cardiomediastinal silhouette is within normal limits. No acute osseous abnormalities are seen. IMPRESSION: 1. Endotracheal tube seen ending 4-5 cm above the carina. 2. Mild bibasilar airspace opacities may reflect atelectasis or pneumonia, slightly more prominent than on the prior study. Electronically Signed   By: Garald Balding M.D.   On: 01/02/2018 06:34   Dg Chest Port 1 View  Result Date: 01/02/2018 CLINICAL DATA:  Endotracheal tube placement. EXAM: PORTABLE CHEST 1 VIEW COMPARISON:  Chest radiograph performed 01/01/2018 FINDINGS: The patient's endotracheal tube is seen ending  7-8 cm above the carina. A right IJ line is noted ending about the cavoatrial junction. The lungs are well-aerated. Peribronchial thickening is noted. Minimal medial right basilar opacity could reflect pneumonia there is no evidence of pleural effusion or pneumothorax. The cardiomediastinal silhouette is within normal limits. No acute osseous abnormalities are seen. IMPRESSION: 1. Endotracheal tube seen ending 7-8 cm above the carina. 2. Minimal medial right basilar airspace opacity could reflect pneumonia. Electronically Signed   By: Garald Balding M.D.   On: 01/02/2018 02:21   Dg Abd Portable 1v  Result Date: 01/02/2018 CLINICAL DATA:  Orogastric tube  placement. EXAM: PORTABLE ABDOMEN - 1 VIEW COMPARISON:  None. FINDINGS: The patient's enteric tube is noted ending overlying the body of the stomach. The visualized bowel gas pattern is grossly unremarkable. No free intra-abdominal air is seen on this provided upright view. The visualized lung bases are grossly clear. No acute osseous abnormalities are seen. IMPRESSION: Enteric tube noted ending overlying the body of the stomach. Electronically Signed   By: Garald Balding M.D.   On: 01/02/2018 03:41   US Abdomen Limited Ruq  Result Date: 01/02/2018 CLINICAL DATA:  Hepatitis EXAM: ULTRASOUND ABDOMEN LIMITED RIGHT UPPER QUADRANT COMPARISON:  None. FINDINGS: Gallbladder: No gallstones. The gallbladder mildly distended. No gallbladder wall thickening. No pericholecystic fluid. Murphy sign cannot be assessed due to patient on ventilator. Common bile duct: Diameter: Normal at 3 mm. Liver: No focal lesion identified. Within normal limits in parenchymal echogenicity. Portal vein is patent on color Doppler imaging with normal direction of blood flow towards the liver. IMPRESSION: 1. No cholelithiasis or gallbladder wall thickening. 2. No biliary duct dilatation. 3. Normal liver parenchyma by ultrasound. Electronically Signed   By: Suzy Bouchard M.D.   On:  01/02/2018 08:45    Cardiac Studies   Echo pending  Patient Profile     57 y.o. male with polysubstance abuse, HCV, and hypertensionhere after being found unresponsive at home.  Cardiology evaluating due to elevated troponin.    Assessment & Plan    # Elevated Troponin: Mr. Swails denies chest pain but reports a family history of premature CAD.   Unable to get a full history given that he is intubated at this time.  Troponin peaked at 3.25 and continues to trend down.  Likely demand ischemia.  CK not elevated, indicating he did not have rhabdomyolysis.  Echo pending.  He remains on heparin for now.  If echo shows normal systolic function without wall motion abnormality would be reasonable to continue this for 48 hours and then discontinue.  Likely stress versus cath once stable depending on what his echo shows.  We will switch aspirin to 81 mg.  No statin due to his transaminitis.  # Bradycardia:  Multiple electrolyte abnormalities (potassium, phos, calcium).    Like lites are being supplemented.  Bradycardia has resolv since switching from phenylephrine to Levopheded.  # Aspiration pneumonia: # Hypoxic respiratory failure: Antibiotics per critical care.   # Polysubstance abuse: UDS positive for benzos and oxycodone.  # Transaminitis: # HCV: LFTs downtrending.  Hepatitis 2/2 HCV +/- EtOH.    For questions or updates, please contact [place] Please consult [email or web address] for contact info under Cardiology/STEMI.   Time spent: 40 minutes-Greater than 50% of this time was spent in counseling, explanation of diagnosis, planning of further management, and coordination of care.     Signed, Skeet Latch, MD  01/03/2018, 9:16 AM

## 2018-01-03 NOTE — Progress Notes (Signed)
PULMONARY / CRITICAL CARE MEDICINE   Name: Christian Ellison MRN: [id number] DOB: 17-Apr-1961    ADMISSION DATE:  01/02/2018 CONSULTATION DATE:  01/02/18  REFERRING MD: transfer from [facility]  CHIEF COMPLAINT: Benzo overdose   HISTORY OF PRESENT ILLNESS:   57yoM with hx HCV, Polysubstance abuse, HTN, and Chronic pain, who presented initially to [facility] after becoming unresponsive at home. EMS gave  Narcan without response. Patient was minimally responsive upon arrival to the OSH ER and was intubated for airway protection. Patient's family reported patient has a history of abusing benzos and that his girlfriend overdosed on heroin a few weeks ago. UDS at [facility] was positive for oxycodone and benzos. Patient was then transferred to [facility] where on arrival he was febrile to 101.9, with lactic acidosis climbing from 1.6 to 2.4, elevated procalcitonin, and elevated troponin of 3.25. He was also found to have elevated LFt's and AKI. No family present at time of my exam.   SUBJECTIVE:  Sedated and intubated Remains hypotensive and bradycardic. Phenylephrine changed to Levophed ( currently) due to bradycardia. Agitation with decreased sedation.   VITAL SIGNS: BP 103/66 (BP Location: Left Arm)   Pulse (!) 54   Temp 98.8 F (37.1 C) (Oral)   Resp 18   Ht  (1.88 m)   Wt 83.7 kg (184 lb 8.4 oz)   SpO2 100%   BMI 23.69 kg/m   VENTILATOR SETTINGS: Vent Mode: PRVC FiO2 (%):  [50 %] 50 % Set Rate:  [18 bmp] 18 bmp Vt Set:  [600 mL] 600 mL PEEP:  [5 cmH20] 5 cmH20 Plateau Pressure:  [15 cmH20-20 cmH20] 17 cmH20  INTAKE / OUTPUT: I/O last 3 completed shifts: In: 4907.5 [I.V.:2527.5; NG/GT:180; IV Piggyback:2200] Out: 3125 [Urine:2775; Emesis/NG output:350]  PHYSICAL EXAMINATION:  General:  Middle aged male of normal body habitus Neuro:   HEENT:  [place]/AT, No JVD noted, PERRL Cardiovascular:  RRR, no MRG Lungs:   Abdomen:  Soft,  non-distended Musculoskeletal:  No acute deformity Skin:  Intact, MMM   LABS:  BMET Recent Labs  Lab 01/02/18 0209 01/02/18 0623 01/03/18 0435  NA 137 139 141  K 4.1 4.1 2.9*  CL 101 102 105  CO2 BUN 27* 24* 14  CREATININE 1.22 1.07 0.83  GLUCOSE 123* 127* 133*   Electrolytes Recent Labs  Lab 01/02/18 0209 01/02/18 0623 01/03/18 0435  CALCIUM 7.8* 7.9* 7.7*  MG  --  2.1 2.0  PHOS  --  1.8* 1.5*   CBC Recent Labs  Lab 01/02/18 0209 01/02/18 0623 01/03/18 0435  WBC 10.2 9.9 9.3  HGB 13.5 12.8* 11.6*  HCT 41.1 38.5* 34.3*  PLT 154 148* 175   Coag's Recent Labs  Lab 01/02/18 0209  INR 1.46   Sepsis Markers Recent Labs  Lab 01/02/18 0209  01/02/18 0623 01/02/18 1005 01/02/18 1221  LATICACIDVEN  --    < > 1.8 1.6 1.5  PROCALCITON 2.41  --   --   --   --    < > = values in this interval not displayed.   ABG Recent Labs  Lab 01/02/18 0205 01/02/18 0621 01/03/18 0351  PHART 7.321* 7.428 7.498*  PCO2ART 58.6* 39.7 34.4  PO2ART 78.9* 47.0* 105   Liver Enzymes Recent Labs  Lab 01/02/18 0209 01/03/18 0435  AST 2,905* 1,011*  ALT 3,475* 2,516*  ALKPHOS 61 64  BILITOT 1.0 1.4*  ALBUMIN 3.2* 2.7*   Cardiac Enzymes Recent Labs  Lab 01/02/18 0623 01/02/18 1221 01/02/18 1934  TROPONINI 2.70* 1.48* 0.96*   Glucose Recent Labs  Lab 01/02/18 1131 01/02/18 1518 01/02/18 2018 01/02/18 2330 01/03/18 0337 01/03/18 0751  GLUCAP 130* 135* 81 83 138* 117*   Imaging Dg Chest Port 1 View  Result Date: 01/03/2018 CLINICAL DATA:  Respiratory failure, short of breath EXAM: PORTABLE CHEST 1 VIEW COMPARISON:  01/02/2018, 01/01/2018 FINDINGS: Endotracheal tube tip is about 3 cm superior to the carina. Esophageal tube tip is below the diaphragm but not included. Right IJ central venous catheter tip overlies the proximal right atrium. Increasing bibasilar opacity, atelectasis versus infiltrates. Slight increased vascular congestion. Stable  heart size. No pneumothorax. IMPRESSION: 1. Endotracheal tube tip about 3 cm superior to carina 2. Increasing airspace disease at the lung bases, atelectasis versus pneumonia 3. Slight increased vascular congestion Electronically Signed   By: Jasmine Pang M.D.   On: 01/03/2018 03:18   US Abdomen Limited Ruq  Result Date: 01/02/2018 CLINICAL DATA:  Hepatitis EXAM: ULTRASOUND ABDOMEN LIMITED RIGHT UPPER QUADRANT COMPARISON:  None. FINDINGS: Gallbladder: No gallstones. The gallbladder mildly distended. No gallbladder wall thickening. No pericholecystic fluid. Murphy sign cannot be assessed due to patient on ventilator. Common bile duct: Diameter: Normal at 3 mm. Liver: No focal lesion identified. Within normal limits in parenchymal echogenicity. Portal vein is patent on color Doppler imaging with normal direction of blood flow towards the liver. IMPRESSION: 1. No cholelithiasis or gallbladder wall thickening. 2. No biliary duct dilatation. 3. Normal liver parenchyma by ultrasound. Electronically Signed   By: Genevive Bi M.D.   On: 01/02/2018 08:45   CULTURES: Blood cultures (5/19): pending UA (OSH): negative for UTI Sputum culture (5/19): pending  ANTIBIOTICS: Vancomycin 5/19 >> Zosyn 5/18 >>  SIGNIFICANT EVENTS: 5/18: presented to [facility] with AMS requiring intubation, presumed benzo/oxycodone overdose 5/19: transferred to [facility]; found to have aspiration pneumonia 5/20: RUQ [place] negative for acute findings.   LINES/TUBES: RIJ TLC (from OSH) 5/18 >> ETT 5/18 >> OG tube 5/18 >> Foley catheter 5/18 >>  DISCUSSION: 57yoM with hx Hepatitis, Polysubstance abuse, HTN, and Chronic pain, who presented initially to [facility] after becoming unresponsive at home following a presumed benzo/oxycodone overdose, requiring intubation, now with aspiration pneumonia and AKI. Course complicated by hypotension and bradycardia (thought to be secondary to sedating medications) requiring low  dose levophed. Mental status improved by 5/20 AM.  ASSESSMENT / PLAN:  Acute hypoxic respiratory failure in the setting of benzo/opioid overdose. Complicated by  aspiration pneumonia: - Adjust vent for ABG - CXR and ABG in AM - Titrate O2 for sat of 88-92%  NSTEMI: Troponin 3.25; EKG with no ST changes. Troponin trending down - Stop trending trop, clear downward trend.  - Heparin infusion - Echo pending  Hx HTN: - Hold home anti-HTN for now  AKI: - BMET in AM - Hydrate - Replace electrolytes as intubated  Transaminitis; Hx HCV: Significantly elevated AST and ALT with normal Tbili; unclear if due to acute hepatitis from his known HCV, from possible Etoh hepatitis, or other etiology. APAP level negative. RUQ Ultrasound negative - Acute hepatitis panel pending - Repeat LFT's daily.   Aspiration pneumonia: CXR with mild right basilar infiltrate Procalcitonin high. Lactate cleared - Cultures pending - Continue Zosyn - DC vancomycin  Acute Encephalopathy; Benzo and Oxycodone overdose: Presumed overdose per family; UDS positive for oxycodone and benzos at OSH. Positive for only benzos here.  -Sedation off -Will need substance abuse counseling once extubated.  FAMILY  - Updates: Family updated bedside 5/20 AM (sister who is RN with [facility]) - Inter-disciplinary family meet or Palliative Care meeting due by:  01/08/18  Joneen Roach, AGACNP-BC [place] Pulmonology/Critical Care Pager [phone] or [phone]  01/03/2018 8:23 AM

## 2018-01-03 NOTE — Procedures (Signed)
Extubation Procedure Note  Patient Details:   Name: Christian Ellison DOB: 20-May-1961 MRN: [id number]   Airway Documentation:    Vent end date: 01/03/18 Vent end time: 1045   Evaluation  O2 sats: stable throughout Complications: No apparent complications Patient did tolerate procedure well. Bilateral Breath Sounds: Clear   Yes, Placed on 4L [place] SPO2 98%  Toula Moos 01/03/2018, 11:05 AM

## 2018-01-03 NOTE — Progress Notes (Signed)
Bedside swallow eval performed.  Patient tolerated liquids and solids without complication.  Will advance patient to a regular diet.

## 2018-01-03 NOTE — Progress Notes (Signed)
eLink Physician-Brief Progress Note Patient Name: Christian Ellison DOB: 1960-09-06 MRN: [id number]   Date of Service  01/03/2018  HPI/Events of Note  K+ = 2.9, PO4--- = 1.5 and Creatinine = 0.83.   eICU Interventions  Will order: 1. Replace K+ and PO4---. 2. BMP and Phosphorus level at 2 PM.      Intervention Category Major Interventions: Electrolyte abnormality - evaluation and management  Sommer,Steven Eugene 01/03/2018, 6:59 AM

## 2018-01-03 NOTE — Progress Notes (Signed)
Elink notified of HR in low 50's. Neo stopped and Levo ordered and started at . Dr. Merlene Pulling at bedside d/t patients ETT out 1cm and able to talk over tube. ETT advanced to 26cm at lips and sedation increased. Patient is now resting comfortably in bed. Will continue to monitor.

## 2018-01-03 NOTE — Progress Notes (Signed)
  Echocardiogram 2D Echocardiogram has been performed.  Christian Ellison F 01/03/2018, 1:10 PM

## 2018-01-03 NOTE — Progress Notes (Signed)
eLink Physician-Brief Progress Note Patient Name: Christian Ellison DOB: 11-02-1960 MRN: [id number]   Date of Service  01/03/2018  HPI/Events of Note  Bradycardia - HR = 57 - Likely secondary to Propofol and Phenylephrine.   eICU Interventions  Will order: 1. Norepinephrine IV infusion. Titrate to MAP  > 65. 2. Wean Phenylephrine IV infusion off. 3. Wean Propofol IV infusion as tolerated.      Intervention Category Major Interventions: Arrhythmia - evaluation and management  Sommer,Steven Eugene 01/03/2018, 1:58 AM

## 2018-01-03 NOTE — Progress Notes (Signed)
PCCM INTERVAL PROGRESS NOTE  PM rounds.  Tolerating extubation well. Still with some drowsiness, but is awake and able to answer questions.  Will monitor in ICU overnight and likely transfer out of ICU in the AM  [facility], [facility] Pulmonology/Critical Care Pager [phone] or [phone]  01/03/2018 5:14 PM

## 2018-01-03 NOTE — Progress Notes (Addendum)
Wasted of fentanyl in the sink with Dwyane Luo, RN

## 2018-01-03 NOTE — Care Management Note (Addendum)
Case Management Note  Patient Details  Name: Christian Ellison MRN: [id number] Date of Birth: 09-14-1960  Subjective/Objective:    History of Chronic pain, Opiate use and HTN;  Admitted as transfer from [facility] for post benzo/oxycodone overdose with acute respiratory failure and sepsis due to aspiration pneumonia.    Action/Plan: Troponins were elevated and suspect possible NSTEMI on IV Heparin, Cardiology consulted. He was also found to have elevated LFt's and AKI. Prior to admission patient lived at home.   NCM will continue to follow how patient progresses.  Expected Discharge Date:   To Be determined.              Expected Discharge Plan:   To Be determined.  In-House Referral:  Clinical Social Work, Medical sales representative  CM Consult  Status of Service:  In process, will continue to follow  If discussed at Long Length of Stay Meetings, dates discussed:    Additional Comments:  Yancey Flemings, RN 01/03/2018, 4:04 PM

## 2018-01-03 NOTE — Progress Notes (Signed)
Elink notified of BP in 80's and maps of 60's. Neo ordered and started. Will continue to monitor.

## 2018-01-03 NOTE — Progress Notes (Signed)
eLink Physician-Brief Progress Note Patient Name: Christian Ellison DOB: Apr 25, 1961 MRN: [id number]   Date of Service  01/03/2018  HPI/Events of Note  Patient extubated and on PO diet. Request to change per tube meds to PO.   eICU Interventions  Will change per tube meds to PO.      Intervention Category Minor Interventions: Routine modifications to care plan (e.g. PRN medications for pain, fever)  Sommer,Steven Eugene 01/03/2018, 9:57 PM

## 2018-01-03 NOTE — Progress Notes (Signed)
eLink Physician-Brief Progress Note Patient Name: Christian Ellison DOB: 1961-06-10 MRN: [id number]   Date of Service  01/03/2018  HPI/Events of Note  Hypotension - BP = 86/52 with MAP = 63. CVP = 12 and Hgb = 12.8.  eICU Interventions  Will order: 1. Phenylephrine IV infusion. Titrate to MAP > 65.     Intervention Category Major Interventions: Hypotension - evaluation and management  Sommer,Steven Eugene 01/03/2018, 1:13 AM

## 2018-01-03 NOTE — Progress Notes (Signed)
ANTICOAGULATION CONSULT NOTE - Follow-Up Consult  Pharmacy Consult for heparin Indication: chest pain/ACS  No Known Allergies  Patient Measurements: Height:  (188 cm) Weight: 184 lb 8.4 oz (83.7 kg) IBW/kg (Calculated) : 82.2  Vital Signs: Temp: 97.5 F (36.4 C) (05/20 0754) Temp Source: Oral (05/20 0754) BP: 128/85 (05/20 1100) Pulse Rate: 81 (05/20 1100)  Labs: Recent Labs    01/02/18 0209 01/02/18 0623  01/02/18 1221  01/02/18 1624 01/02/18 1845 01/02/18 1934 01/03/18 0243 01/03/18 0435 01/03/18 1047  HGB 13.5 12.8*  --   --   --   --   --   --   --  11.6*  --   HCT 41.1 38.5*  --   --   --   --   --   --   --  34.3*  --   PLT 154 148*  --   --   --   --   --   --   --  175  --   LABPROT 17.6*  --   --   --   --   --   --   --   --   --   --   INR 1.46  --   --   --   --   --   --   --   --   --   --   HEPARINUNFRC  --   --    < >  --    < >  --  0.11*  --  0.26*  --  0.27*  CREATININE 1.22 1.07  --   --   --   --   --   --   --  0.83  --   CKTOTAL  --   --   --   --   --  66  --   --   --   --   --   TROPONINI 3.25* 2.70*  --  1.48*  --   --   --  0.96*  --   --   --    < > = values in this interval not displayed.    Estimated Creatinine Clearance: 115.5 mL/min (by C-G formula based on SCr of 0.83 mg/dL).   Assessment: 57yo male transferred from OSH for benzodiazepene overdose. Troponins were elevated and suspect possible NSTEMI on IV Heparin.    Heparin level remains sub-therapeutic at 0.27 after increase to 1500 units/hr.   Goal of Therapy:  Heparin level 0.3-0.7 units/ml Monitor platelets by anticoagulation protocol: Yes   Plan:  Increase heparin drip to 1600 units/hr 6 hour heparin level to confirm Daily heparin level/CBC Follow-up LOT  Thank you Link Snuffer, PharmD, BCPS, BCCCP Clinical Pharmacist Clinical phone 01/03/2018 until 3:30PM- #[phone] After hours, please call #[phone] 01/03/2018,11:23 AM

## 2018-01-04 DIAGNOSIS — R7401 Elevation of levels of liver transaminase levels: Secondary | ICD-10-CM

## 2018-01-04 DIAGNOSIS — R740 Nonspecific elevation of levels of transaminase and lactic acid dehydrogenase [LDH]: Secondary | ICD-10-CM

## 2018-01-04 DIAGNOSIS — R7989 Other specified abnormal findings of blood chemistry: Secondary | ICD-10-CM

## 2018-01-04 LAB — CULTURE, RESPIRATORY

## 2018-01-04 LAB — CULTURE, RESPIRATORY W GRAM STAIN: Culture: NORMAL

## 2018-01-04 LAB — CBC
HEMATOCRIT: 30.9 % — AB (ref 39.0–52.0)
Hemoglobin: 10.4 g/dL — ABNORMAL LOW (ref 13.0–17.0)
MCH: 30.1 pg (ref 26.0–34.0)
MCHC: 33.7 g/dL (ref 30.0–36.0)
MCV: 89.3 fL (ref 78.0–100.0)
PLATELETS: 125 10*3/uL — AB (ref 150–400)
RBC: 3.46 MIL/uL — ABNORMAL LOW (ref 4.22–5.81)
RDW: 12.1 % (ref 11.5–15.5)
WBC: 3.4 10*3/uL — ABNORMAL LOW (ref 4.0–10.5)

## 2018-01-04 LAB — HEPATIC FUNCTION PANEL
ALBUMIN: 2.7 g/dL — AB (ref 3.5–5.0)
ALK PHOS: 64 U/L (ref 38–126)
ALT: 2516 U/L — AB (ref 17–63)
AST: 1011 U/L — AB (ref 15–41)
BILIRUBIN DIRECT: 0.5 mg/dL (ref 0.1–0.5)
BILIRUBIN TOTAL: 1.4 mg/dL — AB (ref 0.3–1.2)
Indirect Bilirubin: 0.9 mg/dL (ref 0.3–0.9)
Total Protein: 5.2 g/dL — ABNORMAL LOW (ref 6.5–8.1)

## 2018-01-04 LAB — COMPREHENSIVE METABOLIC PANEL
ALT: 1642 U/L — AB (ref 17–63)
AST: 358 U/L — AB (ref 15–41)
Albumin: 2.6 g/dL — ABNORMAL LOW (ref 3.5–5.0)
Alkaline Phosphatase: 52 U/L (ref 38–126)
Anion gap: 7 (ref 5–15)
BILIRUBIN TOTAL: 1.1 mg/dL (ref 0.3–1.2)
CHLORIDE: 108 mmol/L (ref 101–111)
CO2: 29 mmol/L (ref 22–32)
CREATININE: 0.74 mg/dL (ref 0.61–1.24)
Calcium: 7.6 mg/dL — ABNORMAL LOW (ref 8.9–10.3)
GFR calc non Af Amer: >60 mL/min (ref >60)
Glucose, Bld: 101 mg/dL — ABNORMAL HIGH (ref 65–99)
POTASSIUM: 3.4 mmol/L — AB (ref 3.5–5.1)
Sodium: 144 mmol/L (ref 135–145)
TOTAL PROTEIN: 5.4 g/dL — AB (ref 6.5–8.1)

## 2018-01-04 LAB — PROCALCITONIN: Procalcitonin: 0.54 ng/mL

## 2018-01-04 LAB — GLUCOSE, CAPILLARY
GLUCOSE-CAPILLARY: 90 mg/dL (ref 65–99)
Glucose-Capillary: 103 mg/dL — ABNORMAL HIGH (ref 65–99)

## 2018-01-04 LAB — HEPARIN LEVEL (UNFRACTIONATED): Heparin Unfractionated: 0.34 IU/mL (ref 0.30–0.70)

## 2018-01-04 MED ORDER — ALPRAZOLAM 0.5 MG PO TABS: 1.0000 mg | ORAL_TABLET | Freq: Every evening | ORAL | Status: DC | PRN

## 2018-01-04 MED ORDER — ORAL CARE MOUTH RINSE
15.0000 mL | Freq: Two times a day (BID) | OROMUCOSAL | Status: DC
  Administered 2018-01-04 – 2018-01-06 (×4): 15 mL via OROMUCOSAL

## 2018-01-04 MED ORDER — HYDROCODONE-ACETAMINOPHEN 10-325 MG PO TABS: 1.0000 | ORAL_TABLET | Freq: Four times a day (QID) | ORAL | Status: DC | PRN

## 2018-01-04 MED ORDER — LABETALOL HCL 5 MG/ML IV SOLN
10.0000 mg | INTRAVENOUS | Status: DC | PRN
  Administered 2018-01-04: 10 mg via INTRAVENOUS
  Filled 2018-01-04: qty 4

## 2018-01-04 MED ORDER — ENOXAPARIN SODIUM 40 MG/0.4ML ~~LOC~~ SOLN
40.0000 mg | SUBCUTANEOUS | Status: DC
  Administered 2018-01-04 – 2018-01-06 (×3): 40 mg via SUBCUTANEOUS
  Filled 2018-01-04 (×3): qty 0.4

## 2018-01-04 MED ORDER — METHOCARBAMOL 500 MG PO TABS: 1000.0000 mg | ORAL_TABLET | Freq: Four times a day (QID) | ORAL | Status: DC | PRN

## 2018-01-04 MED ORDER — ASPIRIN EC 81 MG PO TBEC
81.0000 mg | DELAYED_RELEASE_TABLET | Freq: Every day | ORAL | Status: DC
  Administered 2018-01-05 – 2018-01-06 (×2): 81 mg via ORAL
  Filled 2018-01-04 (×2): qty 1

## 2018-01-04 MED ORDER — CITALOPRAM HYDROBROMIDE 20 MG PO TABS
40.0000 mg | ORAL_TABLET | Freq: Every day | ORAL | Status: DC
  Administered 2018-01-04: 40 mg via ORAL
  Filled 2018-01-04 (×2): qty 2

## 2018-01-04 MED ORDER — METOPROLOL TARTRATE 25 MG PO TABS
25.0000 mg | ORAL_TABLET | Freq: Two times a day (BID) | ORAL | Status: DC
  Administered 2018-01-04 – 2018-01-06 (×5): 25 mg via ORAL
  Filled 2018-01-04 (×5): qty 1

## 2018-01-04 NOTE — Progress Notes (Signed)
Progress Note  Patient Name: Christian Ellison Date of Encounter: 01/04/2018  Primary Cardiologist: Skeet Latch, MD   Subjective   Patient extubated yesterday. Feeling okay. Tolerating PO intake. He denies CP or SOB. States that a tick bite caused him to aspirate which caused him to get so sick.   Inpatient Medications    Scheduled Meds: . aspirin  325 mg Oral Daily  . chlorhexidine gluconate (MEDLINE KIT)  15 mL Mouth Rinse BID  . docusate  100 mg Oral BID  . famotidine  20 mg Oral BID  . insulin aspart  1-3 Units Subcutaneous Q4H  . mouth rinse  15 mL Mouth Rinse BID   Continuous Infusions: . sodium chloride 75 mL/hr at 01/04/18 0600  . sodium chloride 10 mL/hr at 01/04/18 0600  . fentaNYL infusion INTRAVENOUS Stopped (01/03/18 0830)  . heparin 1,800 Units/hr (01/04/18 0600)  . norepinephrine (LEVOPHED) Adult infusion Stopped (01/03/18 0844)  . piperacillin-tazobactam (ZOSYN)  IV 3.375 g (01/04/18 0526)  . propofol (DIPRIVAN) infusion Stopped (01/03/18 0830)   PRN Meds: sodium chloride, fentaNYL, ibuprofen, midazolam, midazolam, ondansetron (ZOFRAN) IV   Vital Signs    Vitals:   01/04/18 0344 01/04/18 0400 01/04/18 0500 01/04/18 0600  BP:  117/68 123/75 123/79  Pulse:  77 78 79  Resp:  19 (!) 21 19  Temp: 99.2 F (37.3 C)     TempSrc: Oral     SpO2:  97% 95% 95%  Weight: 186 lb 8.2 oz (84.6 kg)     Height:        Intake/Output Summary (Last 24 hours) at 01/04/2018 0740 Last data filed at 01/04/2018 0600 Gross per 24 hour  Intake 2714.1 ml  Output 3515 ml  Net -800.9 ml   Filed Weights   01/02/18 2330 01/03/18 0440 01/04/18 0344  Weight: 184 lb 8.4 oz (83.7 kg) 184 lb 8.4 oz (83.7 kg) 186 lb 8.2 oz (84.6 kg)   Telemetry    Episodes of bradycardia with HR in the 40-50s. Occasional PVCs - Personally Reviewed  ECG    No new EKG for review - Personally Reviewed  Physical Exam   General: Well nourished male in no acute distress Pulm: Good air  movement with no wheezing or crackles  CV: RRR, no murmurs, no rubs  Abdomen: Active bowel sounds, soft, non-distended Extremities: No LE edema  Skin: Warm and dry  Neuro: Alert and oriented x 3  Labs    Chemistry Recent Labs  Lab 01/02/18 0209  01/03/18 0435 01/03/18 1314 01/04/18 0339  NA 137   < > 141 141 144  K 4.1   < > 2.9* 3.5 3.4*  CL 101   < > 105 108 108  CO2 27   < > '26 27 29  ' GLUCOSE 123*   < > 133* 127* 101*  BUN 27*   < > 14 8 <5*  CREATININE 1.22   < > 0.83 0.72 0.74  CALCIUM 7.8*   < > 7.7* 7.5* 7.6*  PROT 6.5  --  5.2*  --  5.4*  ALBUMIN 3.2*  --  2.7*  --  2.6*  AST 2,905*  --  1,011*  --  358*  ALT 3,475*  --  2,516*  --  1,642*  ALKPHOS 61  --  64  --  52  BILITOT 1.0  --  1.4*  --  1.1  GFRNONAA >60   < > >60 >60 >60  GFRAA >60   < > >60 >  60 >60  ANIONGAP 9   < > '10 6 7   ' < > = values in this interval not displayed.    Hematology Recent Labs  Lab 01/02/18 0623 01/03/18 0435 01/04/18 0339  WBC 9.9 9.3 3.4*  RBC 4.26 3.78* 3.46*  HGB 12.8* 11.6* 10.4*  HCT 38.5* 34.3* 30.9*  MCV 90.4 90.7 89.3  MCH 30.0 30.7 30.1  MCHC 33.2 33.8 33.7  RDW 12.1 12.1 12.1  PLT 148* 175 125*   Cardiac Enzymes Recent Labs  Lab 01/02/18 0209 01/02/18 0623 01/02/18 1221 01/02/18 1934  TROPONINI 3.25* 2.70* 1.48* 0.96*   No results for input(s): TROPIPOC in the last 168 hours.   BNP Recent Labs  Lab 01/02/18 0209  BNP 310.8*    DDimer No results for input(s): DDIMER in the last 168 hours.   Radiology    Dg Chest Port 1 View  Result Date: 01/03/2018 CLINICAL DATA:  Respiratory failure, short of breath EXAM: PORTABLE CHEST 1 VIEW COMPARISON:  01/02/2018, 01/01/2018 FINDINGS: Endotracheal tube tip is about 3 cm superior to the carina. Esophageal tube tip is below the diaphragm but not included. Right IJ central venous catheter tip overlies the proximal right atrium. Increasing bibasilar opacity, atelectasis versus infiltrates. Slight increased vascular  congestion. Stable heart size. No pneumothorax. IMPRESSION: 1. Endotracheal tube tip about 3 cm superior to carina 2. Increasing airspace disease at the lung bases, atelectasis versus pneumonia 3. Slight increased vascular congestion Electronically Signed   By: Donavan Foil M.D.   On: 01/03/2018 03:18   US Abdomen Limited Ruq  Result Date: 01/02/2018 CLINICAL DATA:  Hepatitis EXAM: ULTRASOUND ABDOMEN LIMITED RIGHT UPPER QUADRANT COMPARISON:  None. FINDINGS: Gallbladder: No gallstones. The gallbladder mildly distended. No gallbladder wall thickening. No pericholecystic fluid. Murphy sign cannot be assessed due to patient on ventilator. Common bile duct: Diameter: Normal at 3 mm. Liver: No focal lesion identified. Within normal limits in parenchymal echogenicity. Portal vein is patent on color Doppler imaging with normal direction of blood flow towards the liver. IMPRESSION: 1. No cholelithiasis or gallbladder wall thickening. 2. No biliary duct dilatation. 3. Normal liver parenchyma by ultrasound. Electronically Signed   By: Suzy Bouchard M.D.   On: 01/02/2018 08:45   Cardiac Studies   TTE 01/03/2018 - Left ventricle: The cavity size was moderately dilated. Systolic   function was normal. The estimated ejection fraction was in the   range of 55% to 60%. Wall motion was normal; there were no   regional wall motion abnormalities. Left ventricular diastolic   function parameters were normal.  Patient Profile     57 y.o. male with polysubstance abuse, HCV, and HTN who presented to the ED via EMS after being found unresponsive secondary to presumed opiate/benzo overdose. Cardiology has been consulted for elevated troponin.   Assessment & Plan    Demand Ischemia - Troponin trend: 3.25 -> 2.70 -> 1.48 -> 0.96 - Patient now extubated and without CP/SOB  - Echocardiogram done yesterday showing LVEF of 55-60% with no wall motion abnormalities or valvular abnormalities - Would recommend stopping  heparin at 48 hours  - Outpatient cardiology follow-up with stress test   Bradycardia  - Multiple electrolyte abnormalities on presentation (K, PO4, Ca) - Since resolved.   Acute Hypoxic Respiratory Failure secondary to Aspiration PNA - Extubated on 01/03/18  - Antibiotic therapy per PCCM  Polysubstance Abuse - UDS + for benzos and opiates   Tansaminitis  HCV - Trending down   For questions or  updates, please contact [place] Please consult [email or web address] for contact info under Cardiology/STEMI.     Signed, Ina Homes, MD  01/04/2018, 7:40 AM

## 2018-01-04 NOTE — Progress Notes (Signed)
ANTICOAGULATION CONSULT NOTE - Initial Consult  Pharmacy Consult for Enoxaparin   Indication: VTE prophylaxis  No Known Allergies  Patient Measurements: Height:  (188 cm) Weight: 186 lb 8.2 oz (84.6 kg) IBW/kg (Calculated) : 82.2  Vital Signs: Temp: 98.3 F (36.8 C) (05/21 0809) Temp Source: Oral (05/21 0809) BP: 140/76 (05/21 0700) Pulse Rate: 74 (05/21 0900)  Labs: Recent Labs    01/02/18 0209 01/02/18 0623  01/02/18 1221  01/02/18 1624  01/02/18 1934  01/03/18 0435 01/03/18 1047 01/03/18 1314 01/03/18 1843 01/04/18 0339  HGB 13.5 12.8*  --   --   --   --   --   --   --  11.6*  --   --   --  10.4*  HCT 41.1 38.5*  --   --   --   --   --   --   --  34.3*  --   --   --  30.9*  PLT 154 148*  --   --   --   --   --   --   --  175  --   --   --  125*  LABPROT 17.6*  --   --   --   --   --   --   --   --   --   --   --   --   --   INR 1.46  --   --   --   --   --   --   --   --   --   --   --   --   --   HEPARINUNFRC  --   --    < >  --    < >  --    < >  --    < >  --  0.27*  --  0.29* 0.34  CREATININE 1.22 1.07  --   --   --   --   --   --   --  0.83  --  0.72  --  0.74  CKTOTAL  --   --   --   --   --  66  --   --   --   --   --   --   --   --   TROPONINI 3.25* 2.70*  --  1.48*  --   --   --  0.96*  --   --   --   --   --   --    < > = values in this interval not displayed.    Estimated Creatinine Clearance: 119.9 mL/min (by C-G formula based on SCr of 0.74 mg/dL).   Medical History: Past Medical History:  Diagnosis Date  . Chronic pain   . Hepatitis   . Opiate abuse, continuous (HCC)     Assessment: Patient with normal renal function, recommend medical prophylaxis regimen.  Goal of Therapy:  Monitor for signs and symptoms of bleeding Monitor platelets by anticoagulation protocol: Yes   Plan:  Lovenox 40 mg subq q24hr Monitor CBC for Hgb and platelets; also monitor for Signs and symptoms of bleeding  Tera Mater, PharmD,  FCCM 01/04/2018,9:35 AM

## 2018-01-04 NOTE — Progress Notes (Signed)
Melissa,RN is aware of the discontinue central line order and will remove the central line.

## 2018-01-04 NOTE — Progress Notes (Signed)
PULMONARY / CRITICAL CARE MEDICINE   Name: Christian Ellison MRN: [id number] DOB: 11/02/1960    ADMISSION DATE:  01/02/2018 CONSULTATION DATE:  01/02/18  REFERRING MD: transfer from [facility]  CHIEF COMPLAINT: Benzo overdose   HISTORY OF PRESENT ILLNESS:   57 yo male presented to [facility] with altered mental status.  Concern for aspiration and required intubation for airway.  UDS positive for benzo's, opiates.  Noted to have fever 101.9, lactic acidosis, elevated LFTs, AKI, NSTEMI. PMHx of polysubstance abuse, HCV, HTN, chronic pain.  SUBJECTIVE:  Denies chest pain. Cough better.  VITAL SIGNS: BP 123/79   Pulse 79   Temp 98.3 F (36.8 C) (Oral)   Resp 19   Ht  (1.88 m)   Wt 186 lb 8.2 oz (84.6 kg)   SpO2 95%   BMI 23.95 kg/m   INTAKE / OUTPUT: I/O last 3 completed shifts: In: 4933.8 [P.O.:118; I.V.:3735.8; NG/GT:180; IV Piggyback:900] Out: 4895 Mignon.Lips; Emesis/NG output:350]  PHYSICAL EXAMINATION:   General - pleasant Eyes - pupils reactive ENT - no sinus tenderness, no oral exudate, no LAN Cardiac - regular, no murmur Chest - no wheeze, rales Abd - soft, non tender Ext - no edema Skin - no rashes Neuro - normal strength Psych - normal mood   LABS:  BMET Recent Labs  Lab 01/03/18 0435 01/03/18 1314 01/04/18 0339  NA 141 141 144  K 2.9* 3.5 3.4*  CL 105 108 108  CO2 BUN 14 8 <5*  CREATININE 0.83 0.72 0.74  GLUCOSE 133* 127* 101*   Electrolytes Recent Labs  Lab 01/02/18 0623 01/03/18 0435 01/03/18 1314 01/04/18 0339  CALCIUM 7.9* 7.7* 7.5* 7.6*  MG 2.1 2.0  --   --   PHOS 1.8* 1.5* 4.4  --    CBC Recent Labs  Lab 01/02/18 0623 01/03/18 0435 01/04/18 0339  WBC 9.9 9.3 3.4*  HGB 12.8* 11.6* 10.4*  HCT 38.5* 34.3* 30.9*  PLT 148* 175 125*   Coag's Recent Labs  Lab 01/02/18 0209  INR 1.46   Sepsis Markers Recent Labs  Lab 01/02/18 0209  01/02/18 0623 01/02/18 1005 01/02/18 1221 01/04/18 0339   LATICACIDVEN  --    < > 1.8 1.6 1.5  --   PROCALCITON 2.41  --   --   --   --  0.54   < > = values in this interval not displayed.   ABG Recent Labs  Lab 01/02/18 0205 01/02/18 0621 01/03/18 0351  PHART 7.321* 7.428 7.498*  PCO2ART 58.6* 39.7 34.4  PO2ART 78.9* 47.0* 105   Liver Enzymes Recent Labs  Lab 01/02/18 0209 01/03/18 0435 01/04/18 0339  AST 2,905* 1,011* 358*  ALT 3,475* 2,516* 1,642*  ALKPHOS 61 64 52  BILITOT 1.0 1.4* 1.1  ALBUMIN 3.2* 2.7* 2.6*   Cardiac Enzymes Recent Labs  Lab 01/02/18 0623 01/02/18 1221 01/02/18 1934  TROPONINI 2.70* 1.48* 0.96*   Glucose Recent Labs  Lab 01/03/18 1939 01/03/18 1954 01/03/18 2016 01/03/18 2345 01/04/18 0341 01/04/18 0753  GLUCAP 68 61* 80 75 90 103*   Imaging No results found.    CULTURES: Blood 5/19 >> Sputum 5/19 >>  ANTIBIOTICS: Vancomycin 5/19 >> Zosyn 5/18 >> 5/19   STUDIES: Abd u/s 5/19 >> no stones, normal liver Echo 5/20 >> mod LVH, EF 55 to 60%  SIGNIFICANT EVENTS: 5/18: presented to [facility] with AMS requiring intubation, presumed benzo/oxycodone overdose 5/19: transferred to [facility]; found to have aspiration pneumonia  5/21: transfer to tele  LINES/TUBES: RIJ TLC (from OSH) 5/18 >> 5/21 ETT 5/18 >> 5/20  ASSESSMENT / PLAN:  Acute hypoxic respiratory failure with compromised airway. - bronchial hygiene - oxygen to keep SpO2 > 90%  Aspiration pneumonia. - f/u CXR and then adjust ABx  NSTEMI. Hx of HTN. - d/c heparin gtt 5/21 - continue ASA - add lopressor - will need outpt cardiology f/u  Acute renal failure with ATN. - KVO IV fluids - monitor renal fx  Elevated LFTs. - from shock/hypoxia - improving - f/u LFTs  Acute metabolic encephalopathy from accidental OD of benzo's, opiates. - monitor mental status - slowly resume outpt meds  DVT prophylaxis - lovenox SUP - not indicated Nutrition - Regular diet Goals of care - full code  Transfer to tele  5/21 >> [facility] 5/22 and PCCM off.  Coralyn Helling, MD [facility] Pulmonary/Critical Care 01/04/2018, 8:49 AM

## 2018-01-04 NOTE — Plan of Care (Signed)
  Problem: Education: Goal: Knowledge of General Education information will improve Outcome: Progressing Note:  Patient confused and disoriented to time and situation, will need further education.    Problem: Health Behavior/Discharge Planning: Goal: Ability to manage health-related needs will improve Outcome: Progressing   Problem: Clinical Measurements: Goal: Ability to maintain clinical measurements within normal limits will improve Outcome: Progressing Goal: Will remain free from infection Outcome: Progressing Note:  Antibiotics administered per orders. Goal: Respiratory complications will improve Outcome: Progressing Goal: Cardiovascular complication will be avoided Outcome: Progressing Note:  VSS   Problem: Elimination: Goal: Will not experience complications related to bowel motility Outcome: Progressing Note:  BM 5/20, hard stool/constipation. Colace given.    Problem: Pain Managment: Goal: General experience of comfort will improve Outcome: Progressing   Problem: Safety: Goal: Ability to remain free from injury will improve Outcome: Progressing Note:  High fall risk, bed alarm on, fall mats beside bed.   Problem: Nutrition: Goal: Adequate nutrition will be maintained Outcome: Not Progressing Note:  Poor appetite at this time, did not eat dinner last night, CBG low, improved this morning.   Problem: Elimination: Goal: Will not experience complications related to urinary retention Outcome: Completed/Met

## 2018-01-04 NOTE — Progress Notes (Signed)
ANTICOAGULATION CONSULT NOTE  Pharmacy Consult for heparin Indication: chest pain/ACS  No Known Allergies  Patient Measurements: Height:  (188 cm) Weight: 186 lb 8.2 oz (84.6 kg) IBW/kg (Calculated) : 82.2  Vital Signs: Temp: 99.2 F (37.3 C) (05/21 0344) Temp Source: Oral (05/21 0344) BP: 117/68 (05/21 0400) Pulse Rate: 77 (05/21 0400)  Labs: Recent Labs    01/02/18 0209 01/02/18 0623  01/02/18 1221  01/02/18 1624  01/02/18 1934  01/03/18 0435 01/03/18 1047 01/03/18 1314 01/03/18 1843 01/04/18 0339  HGB 13.5 12.8*  --   --   --   --   --   --   --  11.6*  --   --   --  10.4*  HCT 41.1 38.5*  --   --   --   --   --   --   --  34.3*  --   --   --  30.9*  PLT 154 148*  --   --   --   --   --   --   --  175  --   --   --  125*  LABPROT 17.6*  --   --   --   --   --   --   --   --   --   --   --   --   --   INR 1.46  --   --   --   --   --   --   --   --   --   --   --   --   --   HEPARINUNFRC  --   --    < >  --    < >  --    < >  --    < >  --  0.27*  --  0.29* 0.34  CREATININE 1.22 1.07  --   --   --   --   --   --   --  0.83  --  0.72  --   --   CKTOTAL  --   --   --   --   --  66  --   --   --   --   --   --   --   --   TROPONINI 3.25* 2.70*  --  1.48*  --   --   --  0.96*  --   --   --   --   --   --    < > = values in this interval not displayed.    Estimated Creatinine Clearance: 119.9 mL/min (by C-G formula based on SCr of 0.72 mg/dL).   Assessment: 57yo male transferred from OSH for benzodiazepene overdose. Troponins were elevated and suspect possible NSTEMI on IV Heparin.    Heparin level now therapeutic  Goal of Therapy:  Heparin level 0.3-0.7 units/ml Monitor platelets by anticoagulation protocol: Yes   Plan:  Continue heparin drip 1800 units/hr Daily heparin level/CBC Going on 48 hour duration, consider discontinuing on 5/21   Talbert Cage Poteet 01/04/2018 4:31 AM

## 2018-01-05 ENCOUNTER — Other Ambulatory Visit: Payer: Self-pay

## 2018-01-05 ENCOUNTER — Inpatient Hospital Stay (HOSPITAL_COMMUNITY): Payer: Self-pay

## 2018-01-05 LAB — COMPREHENSIVE METABOLIC PANEL
ALT: 1101 U/L — AB (ref 17–63)
AST: 132 U/L — AB (ref 15–41)
Albumin: 2.8 g/dL — ABNORMAL LOW (ref 3.5–5.0)
Alkaline Phosphatase: 52 U/L (ref 38–126)
Anion gap: 11 (ref 5–15)
BUN: 6 mg/dL (ref 6–20)
CHLORIDE: 104 mmol/L (ref 101–111)
CO2: 29 mmol/L (ref 22–32)
CREATININE: 0.74 mg/dL (ref 0.61–1.24)
Calcium: 8.6 mg/dL — ABNORMAL LOW (ref 8.9–10.3)
GFR calc non Af Amer: >60 mL/min (ref >60)
Glucose, Bld: 97 mg/dL (ref 65–99)
POTASSIUM: 4.1 mmol/L (ref 3.5–5.1)
SODIUM: 144 mmol/L (ref 135–145)
Total Bilirubin: 1.1 mg/dL (ref 0.3–1.2)
Total Protein: 6.1 g/dL — ABNORMAL LOW (ref 6.5–8.1)

## 2018-01-05 LAB — CBC
HCT: 34.4 % — ABNORMAL LOW (ref 39.0–52.0)
HEMOGLOBIN: 11.5 g/dL — AB (ref 13.0–17.0)
MCH: 30 pg (ref 26.0–34.0)
MCHC: 33.4 g/dL (ref 30.0–36.0)
MCV: 89.8 fL (ref 78.0–100.0)
Platelets: 162 10*3/uL (ref 150–400)
RBC: 3.83 MIL/uL — AB (ref 4.22–5.81)
RDW: 12.1 % (ref 11.5–15.5)
WBC: 5.1 10*3/uL (ref 4.0–10.5)

## 2018-01-05 LAB — BASIC METABOLIC PANEL
ANION GAP: 10 (ref 5–15)
BUN: 6 mg/dL (ref 6–20)
CHLORIDE: 102 mmol/L (ref 101–111)
CO2: 27 mmol/L (ref 22–32)
Calcium: 8.9 mg/dL (ref 8.9–10.3)
Creatinine, Ser: 0.7 mg/dL (ref 0.61–1.24)
GFR calc Af Amer: >60 mL/min (ref >60)
GFR calc non Af Amer: >60 mL/min (ref >60)
Glucose, Bld: 98 mg/dL (ref 65–99)
Potassium: 4.1 mmol/L (ref 3.5–5.1)
SODIUM: 139 mmol/L (ref 135–145)

## 2018-01-05 LAB — MAGNESIUM: Magnesium: 2.2 mg/dL (ref 1.7–2.4)

## 2018-01-05 LAB — PROCALCITONIN: PROCALCITONIN: 0.26 ng/mL

## 2018-01-05 MED ORDER — CITALOPRAM HYDROBROMIDE 20 MG PO TABS
20.0000 mg | ORAL_TABLET | Freq: Every day | ORAL | Status: DC
  Administered 2018-01-06: 20 mg via ORAL
  Filled 2018-01-05: qty 1

## 2018-01-05 MED ORDER — SODIUM CHLORIDE 0.9 % IV SOLN
3.0000 g | Freq: Four times a day (QID) | INTRAVENOUS | Status: DC
  Administered 2018-01-05 – 2018-01-06 (×4): 3 g via INTRAVENOUS
  Filled 2018-01-05 (×6): qty 3

## 2018-01-05 NOTE — Progress Notes (Addendum)
NCM spoke with patient sister "Elisa", asked about patient's Medicaid status. NCM advised LCSW is more familiar with this process for Medicaid, could take up to 90 days to process.  NCM advised Elisa patient has new patient appointment scheduled to establish Primary Care Provider to see (AVS).

## 2018-01-05 NOTE — Progress Notes (Signed)
PROGRESS NOTE    Christian Ellison  JGG:[id number] DOB: 1961-08-06 DOA: 01/02/2018 PCP: Redmond School, MD   Brief Narrative:  Per HPI and previous PN [phone] with hx HCV, Polysubstance abuse, HTN, and Chronic pain, who presented initially to [facility] after becoming unresponsive at home. EMS gave 43m Narcan without response. Patient was minimally responsive upon arrival to the OSH ER and was intubated for airway protection. Patient's family reported patient has a history of abusing benzos (he admits to snorting his medication) and that his girlfriend overdosed on heroin a few weeks ago. [facility]. [facility] 101.9, with lactic acidosis climbing from 1.6 to 2.4, elevated procalcitonin, and elevated troponin of 3.25. He was also found to have elevated LFt's and AKI. No family present at time of my exam.   He was extubated on 5/20.  He was treated with broad spectrum antibiotics for pneumonia.  He was also seen by cardiology for NSTEMI who recommend outpatient follow up.    Assessment & Plan:   Active Problems:   Overdose   Transaminitis   Elevated troponin   Acute Hypoxic Respiratory Failure: Likely 2/2 pneumonia in setting of aspiration given history.   SLP evaluation PCT downtrending Zosyn 5/19 -> present (vanc d/c'd) -> narrow to unasyn Culture consistent with resp flora, negative MRSA Blood cx pending (NGTD) Wean O2 as tolerated Negative CXR this AM  NSTEMI. Hx of HTN. - d/c heparin gtt 5/21 - continue ASA - add lopressor - will need outpt cardiology f/u  Acute renal failure  - creatinine seems back to baseline, follow - monitor renal fx  Elevated LFTs. - from shock/hypoxia - improving - negative HIV, hepatitis panel  Acute metabolic encephalopathy from accidental OD of benzo's, opiates. - monitor mental status - slowly resume outpt  meds  Possible Depression: pt denies SI or intentional overdose.  Resuming celexa which he was on as an outpatient.   DVT prophylaxis: lovenox Code Status: full  Family Communication:none at bedside Disposition Plan: pending further improvement   Consultants:   PCCM  Cardiology  Procedures:  Echo 01/03/18 Study Conclusions  - Left ventricle: The cavity size was moderately dilated. Systolic   function was normal. The estimated ejection fraction was in the   range of 55% to 60%. Wall motion was normal; there were no   regional wall motion abnormalities. Left ventricular diastolic   function parameters were normal.Study Conclusions  Antimicrobials:  Anti-infectives (From admission, onward)   Start     Dose/Rate Route Frequency Ordered Stop   01/02/18 1400  vancomycin (VANCOCIN) IVPB 1000 mg/200 mL premix  Status:  Discontinued     1,000 mg 200 mL/hr over 60 Minutes Intravenous Every 12 hours 01/02/18 0349 01/03/18 0822   01/02/18 1000  piperacillin-tazobactam (ZOSYN) IVPB 3.375 g  Status:  Discontinued     3.375 g 12.5 mL/hr over 240 Minutes Intravenous Every 8 hours 01/02/18 0349 01/02/18 0352   01/02/18 0600  piperacillin-tazobactam (ZOSYN) IVPB 3.375 g     3.375 g 12.5 mL/hr over 240 Minutes Intravenous Every 8 hours 01/02/18 0352     01/02/18 0200  piperacillin-tazobactam (ZOSYN) IVPB 3.375 g  Status:  Discontinued     3.375 g 100 mL/hr over 30 Minutes Intravenous  Once 01/02/18 0159 01/02/18 0236   01/02/18 0200  vancomycin (VANCOCIN) 1,500 mg in sodium chloride 0.9 % 500 mL IVPB  1,500 mg 250 mL/hr over 120 Minutes Intravenous  Once 01/02/18 0159 01/02/18 0436      Subjective: No complaints.   Objective: Vitals:   01/05/18 1400 01/05/18 1500 01/05/18 1714 01/05/18 1715  BP: 130/81 (!) 150/92 (!) 174/97   Pulse: (!) 49 (!) 50 (!) 59   Resp: _0 Temp:   98.3 F (36.8 C)   TempSrc:   Oral   SpO2: 94% 90% 94%   Weight:    81.6 kg (180 lb)   Height:    6' (1.829 m)    Intake/Output Summary (Last 24 hours) at 01/05/2018 2008 Last data filed at 01/05/2018 1500 Gross per 24 hour  Intake 1955 ml  Output 425 ml  Net 1530 ml   Filed Weights   01/04/18 0344 01/05/18 0500 01/05/18 1715  Weight: 84.6 kg (186 lb 8.2 oz) 82.2 kg (181 lb 3.5 oz) 81.6 kg (180 lb)    Examination:  General exam: Appears calm and comfortable  Respiratory system: Clear to auscultation. Respiratory effort normal. Cardiovascular system: S1 & S2 heard, RRR. No JVD, murmurs, rubs, gallops or clicks. No pedal edema. Gastrointestinal system: Abdomen is nondistended, soft and nontender. No organomegaly or masses felt. Normal bowel sounds heard. Central nervous system: Alert and oriented. No focal neurological deficits. Extremities: Symmetric 5 x 5 power. Skin: No rashes, lesions or ulcers Psychiatry: Judgement and insight appear normal. Mood & affect appropriate.     Data Reviewed: I have personally reviewed following labs and imaging studies  CBC: Recent Labs  Lab 01/02/18 0209 01/02/18 0623 01/03/18 0435 01/04/18 0339 01/05/18 0544  WBC 10.2 9.9 9.3 3.4* 5.1  NEUTROABS 8.7*  --   --   --   --   HGB 13.5 12.8* 11.6* 10.4* 11.5*  HCT 41.1 38.5* 34.3* 30.9* 34.4*  MCV 92.6 90.4 90.7 89.3 89.8  PLT 154 148* 175 125* 914   Basic Metabolic Panel: Recent Labs  Lab 01/02/18 0623 01/03/18 0435 01/03/18 1314 01/04/18 0339 01/05/18 0544  NA 139 141 141 144 144  K 4.1 2.9* 3.5 3.4* 4.1  CL 102 105 108 108 104  CO2 _1 GLUCOSE 127* 133* 127* 101* 97  BUN 24* 14 8 <5* 6  CREATININE 1.07 0.83 0.72 0.74 0.74  CALCIUM 7.9* 7.7* 7.5* 7.6* 8.6*  MG 2.1 2.0  --   --  2.2  PHOS 1.8* 1.5* 4.4  --   --    GFR: Estimated Creatinine Clearance: 113.2 mL/min (by C-G formula based on SCr of 0.74 mg/dL). Liver Function Tests: Recent Labs  Lab 01/02/18 0209 01/03/18 0435 01/04/18 0339 01/05/18 0544  AST 2,905* 1,011* 358* 132*  ALT  3,475* 2,516* 1,642* 1,101*  ALKPHOS 61 64 52 52  BILITOT 1.0 1.4* 1.1 1.1  PROT 6.5 5.2* 5.4* 6.1*  ALBUMIN 3.2* 2.7* 2.6* 2.8*   No results for input(s): LIPASE, AMYLASE in the last 168 hours. No results for input(s): AMMONIA in the last 168 hours. Coagulation Profile: Recent Labs  Lab 01/02/18 0209  INR 1.46   Cardiac Enzymes: Recent Labs  Lab 01/02/18 0209 01/02/18 0623 01/02/18 1221 01/02/18 1624 01/02/18 1934  CKTOTAL  --   --   --  66  --   TROPONINI 3.25* 2.70* 1.48*  --  0.96*   BNP (last 3 results) No results for input(s): PROBNP in the last 8760 hours. HbA1C: No results for input(s): HGBA1C in the last 72 hours. CBG: Recent Labs  Lab 01/03/18 1954 01/03/18 2016 01/03/18 2345 01/04/18 0341 01/04/18 0753  GLUCAP 61* 80 75 90 103*   Lipid Profile: No results for input(s): CHOL, HDL, LDLCALC, TRIG, CHOLHDL, LDLDIRECT in the last 72 hours. Thyroid Function Tests: No results for input(s): TSH, T4TOTAL, FREET4, T3FREE, THYROIDAB in the last 72 hours. Anemia Panel: No results for input(s): VITAMINB12, FOLATE, FERRITIN, TIBC, IRON, RETICCTPCT in the last 72 hours. Sepsis Labs: Recent Labs  Lab 01/02/18 0209 01/02/18 0418 01/02/18 0623 01/02/18 1005 01/02/18 1221 01/04/18 0339 01/05/18 0544  PROCALCITON 2.41  --   --   --   --  0.54 0.26  LATICACIDVEN  --  2.4* 1.8 1.6 1.5  --   --     Recent Results (from the past 240 hour(s))  MRSA culture     Status: None   Collection Time: 01/02/18  1:38 AM  Result Value Ref Range Status   Specimen Description NASAL SWAB  Final   Special Requests NONE  Final   Culture   Final    NO MRSA DETECTED Performed at [facility] [address] [place], [place] [address]    Report Status 01/03/2018 FINAL  Final  Culture, blood (Routine X 2) w Reflex to ID Panel     Status: None (Preliminary result)   Collection Time: 01/02/18  4:18 AM  Result Value Ref Range Status   Specimen Description BLOOD LEFT  ANTECUBITAL  Final   Special Requests   Final    BOTTLES DRAWN AEROBIC AND ANAEROBIC Blood Culture adequate volume   Culture   Final    NO GROWTH 3 DAYS Performed at [facility], [place] [address], [place], [place] [address]    Report Status PENDING  Incomplete  Culture, blood (Routine X 2) w Reflex to ID Panel     Status: None (Preliminary result)   Collection Time: 01/02/18  4:25 AM  Result Value Ref Range Status   Specimen Description BLOOD RIGHT ANTECUBITAL  Final   Special Requests   Final    BOTTLES DRAWN AEROBIC ONLY Blood Culture adequate volume   Culture   Final    NO GROWTH 3 DAYS Performed at [facility], [place] [address] [place], [place] [address]    Report Status PENDING  Incomplete  Culture, respiratory (NON-Expectorated)     Status: None   Collection Time: 01/02/18  4:53 AM  Result Value Ref Range Status   Specimen Description TRACHEAL ASPIRATE  Final   Special Requests NONE  Final   Gram Stain   Final    FEW WBC PRESENT, PREDOMINANTLY MONONUCLEAR RARE SQUAMOUS EPITHELIAL CELLS PRESENT MODERATE BUDDING YEAST SEEN RARE GRAM POSITIVE RODS    Culture   Final    FEW Consistent with normal respiratory flora. Performed at [facility], [place] [address], [place], [place] [address]    Report Status 01/04/2018 FINAL  Final         Radiology Studies: Dg Chest 2 View  Result Date: 01/05/2018 CLINICAL DATA:  Aspiration pneumonia. EXAM: CHEST - 2 VIEW COMPARISON:  Radiograph of Jan 03, 2018 FINDINGS: The heart size and mediastinal contours are within normal limits. Endotracheal and nasogastric tubes have been removed. No pneumothorax or pleural effusion is noted. Both lungs are clear. The visualized skeletal structures are unremarkable. IMPRESSION: No active cardiopulmonary disease. Electronically Signed   By: Marijo Conception, M.D.   On: 01/05/2018 09:43        Scheduled Meds: .  aspirin EC  81 mg Oral Daily  . chlorhexidine gluconate (MEDLINE KIT)   15 mL Mouth Rinse BID  . [START ON 01/06/2018] citalopram  20 mg Oral Daily  . enoxaparin (LOVENOX) injection  40 mg Subcutaneous Q24H  . mouth rinse  15 mL Mouth Rinse BID  . metoprolol tartrate  25 mg Oral BID   Continuous Infusions: . sodium chloride 75 mL/hr at 01/05/18 1500  . sodium chloride 10 mL/hr at 01/05/18 1500  . piperacillin-tazobactam (ZOSYN)  IV 3.375 g (01/05/18 1458)     LOS: 3 days    Time spent: over 27 min    Fayrene Helper, MD [facility] Pager [phone]  If 7PM-7AM, please contact night-coverage [email or web address] Password [facility] 01/05/2018, 8:08 PM

## 2018-01-05 NOTE — Evaluation (Signed)
Clinical/Bedside Swallow Evaluation Patient Details  Name: Christian Ellison MRN: [id number] Date of Birth: Aug 10, 1961  Today's Date: 01/05/2018 Time: SLP Start Time (ACUTE ONLY): 1619 SLP Stop Time (ACUTE ONLY): 1632 SLP Time Calculation (min) (ACUTE ONLY): 13 min  Past Medical History:  Past Medical History:  Diagnosis Date  . Chronic pain   . Hepatitis   . Opiate abuse, continuous (HCC)    Past Surgical History:  Past Surgical History:  Procedure Laterality Date  . COLONOSCOPY N/A 11/22/2013   Procedure: COLONOSCOPY;  Surgeon: Malissa Hippo, MD;  Location: [facility] SU[facility];  Service: Endoscopy;  Laterality: N/A;  1030  . Fracture Right Leg     Patient has rod/screws in this leg  . Rotor Cuff  2012   Right Shoulder   HPI:  57 yo male presented to [facility] with altered mental status from accidental overdose of benzos, opiates.  Concern for aspiration and required intubation for airway 5/18-5/20.  PMHx of polysubstance abuse, HCV, HTN, chronic pain.   Assessment / Plan / Recommendation Clinical Impression  Pt's oropharyngeal swallow appears functional with no overt signs of aspiration. He reports having reflux - his girlfriend is present and asking for reflux medications, although pt reports that he has not been taking any PTA. He also describes chronic difficulty with swallowing pills and occasionally harder solids, which may also be suggestive of possible esophageal component. Esophageal precautions were reviewed with pt. Would continue with regular diet textures and thin liquids, although MD may wish to consider additional w/u or management of esophageal symptoms. Please reorder SLP with any acute changes. SLP Visit Diagnosis: Dysphagia, unspecified (R13.10)    Aspiration Risk  Mild aspiration risk    Diet Recommendation Regular;Thin liquid   Liquid Administration via: Cup;Straw Medication Administration: Whole meds with liquid Supervision: Patient able to self  feed;Intermittent supervision to cue for compensatory strategies Compensations: Slow rate;Small sips/bites;Follow solids with liquid Postural Changes: Seated upright at 90 degrees;Remain upright for at least 30 minutes after po intake    Other  Recommendations Recommended Consults: Consider esophageal assessment Oral Care Recommendations: Oral care BID   Follow up Recommendations None      Frequency and Duration            Prognosis        Swallow Study   General HPI: 57 yo male presented to [facility] with altered mental status from accidental overdose of benzos, opiates.  Concern for aspiration and required intubation for airway 5/18-5/20.  PMHx of polysubstance abuse, HCV, HTN, chronic pain. Type of Study: Bedside Swallow Evaluation Previous Swallow Assessment: none in chart Diet Prior to this Study: Regular;Thin liquids Temperature Spikes Noted: No Respiratory Status: Nasal cannula History of Recent Intubation: Yes Length of Intubations (days): 3 days Date extubated: 01/03/18 Behavior/Cognition: Alert;Pleasant mood;Cooperative Oral Care Completed by SLP: No Vision: Functional for self-feeding Self-Feeding Abilities: Able to feed self Patient Positioning: Upright in bed Baseline Vocal Quality: Low vocal intensity(pt/girlfriend report it to be at baseline)    Oral/Motor/Sensory Function Overall Oral Motor/Sensory Function: (appears functional during intake)   Ice Chips Ice chips: Not tested   Thin Liquid Thin Liquid: Within functional limits Presentation: Self Fed;Straw    Nectar Thick Nectar Thick Liquid: Not tested   Honey Thick Honey Thick Liquid: Not tested   Puree Puree: Within functional limits Presentation: Self Fed;Spoon   Solid   GO   Solid: Within functional limits Presentation: Self [employer],  Vernona Rieger 01/05/2018,4:59 PM  Maxcine Ham, M.A. CCC-SLP [phone]

## 2018-01-06 ENCOUNTER — Inpatient Hospital Stay (HOSPITAL_COMMUNITY): Payer: Self-pay

## 2018-01-06 DIAGNOSIS — J9601 Acute respiratory failure with hypoxia: Secondary | ICD-10-CM

## 2018-01-06 LAB — CBC
HEMATOCRIT: 34.7 % — AB (ref 39.0–52.0)
HEMOGLOBIN: 11.7 g/dL — AB (ref 13.0–17.0)
MCH: 29.8 pg (ref 26.0–34.0)
MCHC: 33.7 g/dL (ref 30.0–36.0)
MCV: 88.5 fL (ref 78.0–100.0)
Platelets: 190 10*3/uL (ref 150–400)
RBC: 3.92 MIL/uL — AB (ref 4.22–5.81)
RDW: 12 % (ref 11.5–15.5)
WBC: 4.9 10*3/uL (ref 4.0–10.5)

## 2018-01-06 LAB — HEPATIC FUNCTION PANEL
ALT: 781 U/L — ABNORMAL HIGH (ref 17–63)
AST: 72 U/L — AB (ref 15–41)
Albumin: 3 g/dL — ABNORMAL LOW (ref 3.5–5.0)
Alkaline Phosphatase: 46 U/L (ref 38–126)
BILIRUBIN DIRECT: 0.3 mg/dL (ref 0.1–0.5)
BILIRUBIN INDIRECT: 1.1 mg/dL — AB (ref 0.3–0.9)
TOTAL PROTEIN: 6.5 g/dL (ref 6.5–8.1)
Total Bilirubin: 1.4 mg/dL — ABNORMAL HIGH (ref 0.3–1.2)

## 2018-01-06 LAB — MAGNESIUM: MAGNESIUM: 2.1 mg/dL (ref 1.7–2.4)

## 2018-01-06 MED ORDER — AMOXICILLIN-POT CLAVULANATE 875-125 MG PO TABS: 1.0000 | ORAL_TABLET | Freq: Two times a day (BID) | ORAL | 0 refills | Status: AC

## 2018-01-06 MED ORDER — OMEPRAZOLE MAGNESIUM 20 MG PO TBEC: 40.0000 mg | DELAYED_RELEASE_TABLET | Freq: Every day | ORAL | 0 refills | Status: DC

## 2018-01-06 MED ORDER — ASPIRIN 81 MG PO TBEC: 81.0000 mg | DELAYED_RELEASE_TABLET | Freq: Every day | ORAL | 0 refills | Status: AC

## 2018-01-06 MED ORDER — AMLODIPINE BESYLATE 5 MG PO TABS: 5.0000 mg | ORAL_TABLET | Freq: Every day | ORAL | 0 refills | Status: DC

## 2018-01-06 MED ORDER — CITALOPRAM HYDROBROMIDE 20 MG PO TABS: 40.0000 mg | ORAL_TABLET | Freq: Every day | ORAL | 0 refills | Status: DC

## 2018-01-06 MED ORDER — CITALOPRAM HYDROBROMIDE 20 MG PO TABS: 20.0000 mg | ORAL_TABLET | Freq: Every day | ORAL | 0 refills | Status: DC

## 2018-01-06 MED ORDER — PANTOPRAZOLE SODIUM 40 MG PO TBEC
40.0000 mg | DELAYED_RELEASE_TABLET | Freq: Every day | ORAL | Status: DC
  Administered 2018-01-06: 40 mg via ORAL
  Filled 2018-01-06: qty 1

## 2018-01-06 MED ORDER — METOPROLOL TARTRATE 25 MG PO TABS: 25.0000 mg | ORAL_TABLET | Freq: Two times a day (BID) | ORAL | 0 refills | Status: DC

## 2018-01-06 NOTE — Discharge Summary (Signed)
Physician Discharge Summary  Keithen Capo Honea ZOX:[id number] DOB: 1961/05/02 DOA: 01/02/2018  PCP: Elfredia Nevins, MD  Admit date: 01/02/2018 Discharge date: 01/06/2018  Time spent: 35 minutes  Recommendations for Outpatient Follow-up:  1. Follow up outpatient CBC/CMP (attention to LFT's) 2. Ensure follow up with PCP.  Pt has several outpatient appointments with consultants he'll need to see.  3. Ensure patient continuing new meds aspirin and metop.  Pt with elevated troponin here, likely 2/2 demand.  No statin at this time with elevated liver enzymes.  4. He'll need to follow up with cards for ischemia evaluation. 5. Esophagram with mild stricture, possible spasm.  Decreased peristalsis.  Will need to f/u as outpatient with GI.  Started on prilosec. 6. Outpatient resources for mood.  Started on celexa.  Some concern for decreased appetite, follow this as well 7. Outpatient resources for substance abuse.   8. Follow up hep C RNA PCR.  GI follow up for this.  Discharge Diagnoses:  Active Problems:   Overdose   Transaminitis   Elevated troponin   Aspiration pneumonia [facility])   Discharge Condition: stable  Diet recommendation: heart healthy  Filed Weights   01/05/18 0500 01/05/18 1715 01/06/18 0515  Weight: 82.2 kg (181 lb 3.5 oz) 81.6 kg (180 lb) 80.4 kg (177 lb 3.2 oz)    History of present illness:  Per HPI and previous PN 57yoM with hx HCV, Polysubstance abuse, HTN, and Chronic pain, who presented initially to [facility] after becoming unresponsive at home. EMS gave  Narcan without response. Patient was minimally responsive upon arrival to the OSH ER and was intubated for airway protection. Patient's family reported patient has a history of abusing benzos (he admits to snorting his medication) and that his girlfriend overdosed on heroin a few weeks ago. UDS at [facility] was positive for oxycodone and benzos. Patient was then transferred to [facility] where on arrival he  was febrile to 101.9, with lactic acidosis climbing from 1.6 to 2.4, elevated procalcitonin, and elevated troponin of 3.25. He was also found to have elevated LFt's and AKI. No family present at time of my exam.  He was treated for acute hypoxic respiratory failure which was thought 2/2 pneumonia and overdose.  He was intubated and extubated on 5/20.  He was treated with broad spectrum antibiotics. He was also seen by cardiology for his NSTEMI who recommend outpatient follow up.  He denied intentional overdose and denied SI.  He was seen by speech who recommended regular diet with thin liquids (with aspiration precautions).  He had esophagram which was notable for mild stricture, possible spasm, and decreased peristalsis.  Planning for outpatient follow up.  On the day of discharge he was doing well.  Discharged with PO antibiotics and plan for outpatient follow up.  Hospital Course:  Acute Hypoxic Respiratory Failure: Likely 2/2 pneumonia in setting of aspiration given history with concern for overdose.  SLP evaluation - mild aspiration risk.  Esophageal precautions reviewed with pt by speech.  Esophagram ordered as noted below.  PCT downtrending Zosyn 5/19 -> present (vanc d/c'd) -> narrow to unasyn -> discharged with augmentin Culture consistent with resp flora, negative MRSA Blood cx pending (NGTD) Wean O2 as tolerated Negative CXR 5/22  NSTEMI. Hx of HTN. - d/c heparin gtt 5/21 - continue ASA - continue metoprolol - echo with no WMA - likely demand ischemia.  Cardiology saw and recommended outpatient follow up for ischemia evaluation.    Acute renal failure  -  creatinine seems back to baseline, follow - monitor renal fx  Elevated LFTs. - from shock/hypoxia - improving - negative HIV, hepatitis panel notable for positive hep C antibody - will repeat hep C viral load, follow outpatient  Acute metabolic encephalopathy from accidental OD of benzo's, opiates. - monitor mental  status - slowly resume outpt meds  Substance Abuse: recommended d/c opiates and benzos.  Social work discussed regarding resources.  Recommended outpatient follow up.  Depression: pt denies SI or intentional overdose.  Denies current SI as well.  Notes he knows how that hurts a family so he wouldn't do that.  Sister also notes she doubts this was intentional overdose and that he's never had thoughts or actions like this in past.  Concern that patient had depressed mood while he was here (noted to seem to be depressed and not eating as much).  Discussed these concerns with him and he noted his mood was mainly related to being in hospital and being sick.  Noted he didn't like the food here and would eat more when he got home (discussed with sister to make sure she was aware we noticed this in house and that she would follow up if this is persistent at home).  Started celexa here.  Recommended outpatient follow up with PCP or psych/behavioral health resources.     Dysphagia: esophagram as noted above.  Will need outpatient follow up with GI.  Continue PPI.     Procedures: Study Conclusions  - Left ventricle: The cavity size was moderately dilated. Systolic   function was normal. The estimated ejection fraction was in the   range of 55% to 60%. Wall motion was normal; there were no   regional wall motion abnormalities. Left ventricular diastolic   function parameters were normal.   Consultations:  Cardiology  PCCM  Discharge Exam: Vitals:   01/06/18 1251 01/06/18 1650  BP: (!) 163/91 (!) 169/83  Pulse: (!) 58 (!) 58  Resp: 18 18  Temp: 98.9 F (37.2 C) 99.1 F (37.3 C)  SpO2: 93% 95%   Feeling better.  Ready to go home.    General: No acute distress. Cardiovascular: Heart sounds show a regular rate, and rhythm. No gallops or rubs. No murmurs. No JVD. Lungs: Clear to auscultation bilaterally with good air movement. No rales, rhonchi or wheezes. Abdomen: Soft, nontender,  nondistended with normal active bowel sounds. No masses. No hepatosplenomegaly. Neurological: Alert and oriented 3. Moves all extremities 4. Cranial nerves II through XII grossly intact. Skin: Warm and dry. No rashes or lesions. Extremities: No clubbing or cyanosis. No edema. Pedal pulses 2+. Psychiatric: Mood and affect are normal. Insight and judgment are appropriate.  Discharge Instructions   Discharge Instructions    Call MD for:  difficulty breathing, headache or visual disturbances   Complete by:  As directed    Call MD for:  extreme fatigue   Complete by:  As directed    Call MD for:  persistant dizziness or light-headedness   Complete by:  As directed    Call MD for:  persistant nausea and vomiting   Complete by:  As directed    Call MD for:  redness, tenderness, or signs of infection (pain, swelling, redness, odor or green/yellow discharge around incision site)   Complete by:  As directed    Call MD for:  severe uncontrolled pain   Complete by:  As directed    Call MD for:  temperature >100.4   Complete by:  As  directed    Diet - low sodium heart healthy   Complete by:  As directed    Discharge instructions   Complete by:  As directed    You were seen for a possible overdose.  You required intubation and had pneumonia as well that was treated with antibiotics.  You've improved from a pneumonia standpoint.  We will send you home with augmentin to finish at home.  You had elevated heart enzymes (troponin).  This was related to the pneumonia and overdose above.  This is a type of heart attack.  We started you on aspirin and metoprolol.  Please continue these as an outpatient.  Please follow up with cardiology as an outpatient.    You have a mild esophageal stricture (narrowing).  Start prilosec daily and follow up with GI as an outpatient.    You a positive hepatitis C antibody and have had positive viral loads in the past, but it looks like you cleared the virus  spontaneously in the past.  We repeated the viral load here, please follow up with your PCP as an outpatient for the results.  Follow up your liver enzymes as an outpatient.  Please follow up with gastroenterology for treatment.   Please follow up with the outpatient resources for substance abuse.  Stop the oxycodone.  Stop the xanax as well (you have not had this for a few days in the hospital, so it should be safe to stop).    Please continue the celexa and follow up with a PCP in the next few days to follow up your mood.  Return if you have new, recurrent, or worsening symptoms.   Please ask your PCP to request records from this hospitalization so they know what was done and what the next steps will be.   Increase activity slowly   Complete by:  As directed      Allergies as of 01/06/2018   No Known Allergies     Medication List    STOP taking these medications   ALPRAZolam 1 MG tablet Commonly known as:  XANAX   hydrALAZINE 25 MG tablet Commonly known as:  APRESOLINE   HYDROcodone-acetaminophen 10-325 MG tablet Commonly known as:  NORCO   Oxycodone HCl 10 MG Tabs     TAKE these medications   ALEVE PO Take 2 tablets by mouth as needed. pain   amLODipine 5 MG tablet Commonly known as:  NORVASC Take 1 tablet (5 mg total) by mouth daily.   amoxicillin-clavulanate 875-125 MG tablet Commonly known as:  AUGMENTIN Take 1 tablet by mouth 2 (two) times daily for 6 days.   aspirin 81 MG EC tablet Take 1 tablet (81 mg total) by mouth daily. Start taking on:  01/07/2018   citalopram 20 MG tablet Commonly known as:  CELEXA Take 1 tablet (20 mg total) by mouth daily. What changed:    medication strength  how much to take   loperamide 2 MG capsule Commonly known as:  IMODIUM Take 1 capsule (2 mg total) by mouth 4 (four) times daily as needed for diarrhea or loose stools.   methocarbamol 500 MG tablet Commonly known as:  ROBAXIN Take 2 tablets (1,000 mg total) by mouth  4 (four) times daily as needed (Pain).   metoprolol tartrate 25 MG tablet Commonly known as:  LOPRESSOR Take 1 tablet (25 mg total) by mouth 2 (two) times daily.   MULTI FOR HIM 50+ PO Take by mouth daily.   omeprazole 20 MG  tablet Commonly known as:  PRILOSEC OTC Take 2 tablets (40 mg total) by mouth daily.   ondansetron 4 MG tablet Commonly known as:  ZOFRAN Take 1 tablet (4 mg total) by mouth every 8 (eight) hours as needed for nausea or vomiting.      No Known Allergies Follow-up Information    [facility]. Go on 01/13/2018.   Why:  Appointment at 10:00 am, bring photo ID and pay stubs; to establish Primary Care Provider Contact information: [address]  [place], [place] [address]  Phone: [phone]       Chilton Si, MD Follow up.   Specialty:  Cardiology Contact information: [address] [place] [address] [place] [address] [phone]        Malissa Hippo, MD Follow up.   Specialty:  Gastroenterology Contact information: [address], [address] [place] [address] [phone]        [facility] Follow up.   Specialty:  Cardiology Why:  Please call for follow up appointment Contact information: [address] [place] [address] [phone]           The results of significant diagnostics from this hospitalization (including imaging, microbiology, ancillary and laboratory) are listed below for reference.    Significant Diagnostic Studies: Dg Chest 2 View  Result Date: 01/05/2018 CLINICAL DATA:  Aspiration pneumonia. EXAM: CHEST - 2 VIEW COMPARISON:  Radiograph of Jan 03, 2018 FINDINGS: The heart size and mediastinal contours are within normal limits. Endotracheal and nasogastric tubes have been removed. No pneumothorax or pleural effusion is noted. Both lungs are clear. The visualized skeletal structures are unremarkable. IMPRESSION: No active cardiopulmonary disease.  Electronically Signed   By: Lupita Raider, M.D.   On: 01/05/2018 09:43   Dg Esophagus  Result Date: 01/06/2018 CLINICAL DATA:  Dysphagia. EXAM: ESOPHOGRAM/BARIUM SWALLOW TECHNIQUE: Single contrast examination was performed using  thin barium. FLUOROSCOPY TIME:  Fluoroscopy Time:  1 minutes 52 seconds Radiation Exposure Index (if provided by the fluoroscopic device): Number of Acquired Spot Images: 0 COMPARISON:  None. FINDINGS: Mild decrease in esophageal peristalsis. Esophageal lumen normal. No aspiration. Narrowing of the distal esophagus at the GE junction. This is not obstructive but did occasionally delayed passage of barium. Possible spasm or benign stricture. Margins are smooth. No mass or ulceration. Negative for hiatal hernia or reflux Barium tablet did not pass through the distal esophagus into the stomach due to the distal stricture IMPRESSION: Smooth mild stricture distal esophagus which did not allow passage of the barium tablet. This appears to be a benign stricture and there may be an element of spasm present as well. Decreased esophageal peristalsis. Negative for hiatal hernia or reflux. Electronically Signed   By: Marlan Palau M.D.   On: 01/06/2018 13:46   Dg Chest Port 1 View  Result Date: 01/03/2018 CLINICAL DATA:  Respiratory failure, short of breath EXAM: PORTABLE CHEST 1 VIEW COMPARISON:  01/02/2018, 01/01/2018 FINDINGS: Endotracheal tube tip is about 3 cm superior to the carina. Esophageal tube tip is below the diaphragm but not included. Right IJ central venous catheter tip overlies the proximal right atrium. Increasing bibasilar opacity, atelectasis versus infiltrates. Slight increased vascular congestion. Stable heart size. No pneumothorax. IMPRESSION: 1. Endotracheal tube tip about 3 cm superior to carina 2. Increasing airspace disease at the lung bases, atelectasis versus pneumonia 3. Slight increased vascular congestion Electronically Signed   By: Jasmine Pang M.D.   On:  01/03/2018 03:18  Dg Chest Port 1 View  Result Date: 01/02/2018 CLINICAL DATA:  Endotracheal tube advancement. EXAM: PORTABLE CHEST 1 VIEW COMPARISON:  Chest radiograph performed earlier today at 1:47 a.m. FINDINGS: The patient's endotracheal tube is seen ending 4-5 cm above the carina. An enteric tube is noted extending below the diaphragm. The lungs are well-aerated. Mild bibasilar opacities may reflect atelectasis or pneumonia, slightly more prominent than on the prior study. There is no evidence of pleural effusion or pneumothorax. The cardiomediastinal silhouette is within normal limits. No acute osseous abnormalities are seen. IMPRESSION: 1. Endotracheal tube seen ending 4-5 cm above the carina. 2. Mild bibasilar airspace opacities may reflect atelectasis or pneumonia, slightly more prominent than on the prior study. Electronically Signed   By: Roanna Raider M.D.   On: 01/02/2018 06:34   Dg Chest Port 1 View  Result Date: 01/02/2018 CLINICAL DATA:  Endotracheal tube placement. EXAM: PORTABLE CHEST 1 VIEW COMPARISON:  Chest radiograph performed 01/01/2018 FINDINGS: The patient's endotracheal tube is seen ending 7-8 cm above the carina. A right IJ line is noted ending about the cavoatrial junction. The lungs are well-aerated. Peribronchial thickening is noted. Minimal medial right basilar opacity could reflect pneumonia there is no evidence of pleural effusion or pneumothorax. The cardiomediastinal silhouette is within normal limits. No acute osseous abnormalities are seen. IMPRESSION: 1. Endotracheal tube seen ending 7-8 cm above the carina. 2. Minimal medial right basilar airspace opacity could reflect pneumonia. Electronically Signed   By: Roanna Raider M.D.   On: 01/02/2018 02:21   Dg Abd Portable 1v  Result Date: 01/02/2018 CLINICAL DATA:  Orogastric tube placement. EXAM: PORTABLE ABDOMEN - 1 VIEW COMPARISON:  None. FINDINGS: The patient's enteric tube is noted ending overlying the body of  the stomach. The visualized bowel gas pattern is grossly unremarkable. No free intra-abdominal air is seen on this provided upright view. The visualized lung bases are grossly clear. No acute osseous abnormalities are seen. IMPRESSION: Enteric tube noted ending overlying the body of the stomach. Electronically Signed   By: Roanna Raider M.D.   On: 01/02/2018 03:41   US Abdomen Limited Ruq  Result Date: 01/02/2018 CLINICAL DATA:  Hepatitis EXAM: ULTRASOUND ABDOMEN LIMITED RIGHT UPPER QUADRANT COMPARISON:  None. FINDINGS: Gallbladder: No gallstones. The gallbladder mildly distended. No gallbladder wall thickening. No pericholecystic fluid. Murphy sign cannot be assessed due to patient on ventilator. Common bile duct: Diameter: Normal at 3 mm. Liver: No focal lesion identified. Within normal limits in parenchymal echogenicity. Portal vein is patent on color Doppler imaging with normal direction of blood flow towards the liver. IMPRESSION: 1. No cholelithiasis or gallbladder wall thickening. 2. No biliary duct dilatation. 3. Normal liver parenchyma by ultrasound. Electronically Signed   By: Genevive Bi M.D.   On: 01/02/2018 08:45    Microbiology: Recent Results (from the past 240 hour(s))  MRSA culture     Status: None   Collection Time: 01/02/18  1:38 AM  Result Value Ref Range Status   Specimen Description NASAL SWAB  Final   Special Requests NONE  Final   Culture   Final    NO MRSA DETECTED Performed at [facility], [address] [place], [place] [address]    Report Status 01/03/2018 FINAL  Final  Culture, blood (Routine X 2) w Reflex to ID Panel     Status: None (Preliminary result)   Collection Time: 01/02/18  4:18 AM  Result Value Ref Range Status   Specimen Description BLOOD LEFT ANTECUBITAL  Final   Special Requests   Final    BOTTLES DRAWN AEROBIC AND ANAEROBIC Blood Culture adequate volume   Culture   Final    NO GROWTH 4 DAYS Performed at [facility], [address], [place], [place] [address]    Report Status PENDING  Incomplete  Culture, blood (Routine X 2) w Reflex to ID Panel     Status: None (Preliminary result)   Collection Time: 01/02/18  4:25 AM  Result Value Ref Range Status   Specimen Description BLOOD RIGHT ANTECUBITAL  Final   Special Requests   Final    BOTTLES DRAWN AEROBIC ONLY Blood Culture adequate volume   Culture   Final    NO GROWTH 4 DAYS Performed at [facility], [address] [place], [place] [address]    Report Status PENDING  Incomplete  Culture, respiratory (NON-Expectorated)     Status: None   Collection Time: 01/02/18  4:53 AM  Result Value Ref Range Status   Specimen Description TRACHEAL ASPIRATE  Final   Special Requests NONE  Final   Gram Stain   Final    FEW WBC PRESENT, PREDOMINANTLY MONONUCLEAR RARE SQUAMOUS EPITHELIAL CELLS PRESENT MODERATE BUDDING YEAST SEEN RARE GRAM POSITIVE RODS    Culture   Final    FEW Consistent with normal respiratory flora. Performed at [facility], [address] [place], [place] [address]    Report Status 01/04/2018 FINAL  Final     Labs: Basic Metabolic Panel: Recent Labs  Lab 01/02/18 0623 01/03/18 0435 01/03/18 1314 01/04/18 0339 01/05/18 0544 01/05/18 2050 01/06/18 0415  NA 139 141 141 144 144 139  --   K 4.1 2.9* 3.5 3.4* 4.1 4.1  --   CL 102 105 108 108 104 102  --   CO2 --   GLUCOSE 127* 133* 127* 101* 97 98  --   BUN 24* 14 8 <5* 6 6  --   CREATININE 1.07 0.83 0.72 0.74 0.74 0.70  --   CALCIUM 7.9* 7.7* 7.5* 7.6* 8.6* 8.9  --   MG 2.1 2.0  --   --  2.2  --  2.1  PHOS 1.8* 1.5* 4.4  --   --   --   --    Liver Function Tests: Recent Labs  Lab 01/02/18 0209 01/03/18 0435 01/04/18 0339 01/05/18 0544 01/06/18 0415  AST 2,905* 1,011* 358* 132* 72*  ALT 3,475* 2,516* 1,642* 1,101* 781*  ALKPHOS 61 64 52 52 46  BILITOT 1.0 1.4* 1.1 1.1 1.4*  PROT 6.5 5.2* 5.4* 6.1* 6.5  ALBUMIN 3.2* 2.7* 2.6* 2.8* 3.0*   No  results for input(s): LIPASE, AMYLASE in the last 168 hours. No results for input(s): AMMONIA in the last 168 hours. CBC: Recent Labs  Lab 01/02/18 0209 01/02/18 0623 01/03/18 0435 01/04/18 0339 01/05/18 0544 01/06/18 0415  WBC 10.2 9.9 9.3 3.4* 5.1 4.9  NEUTROABS 8.7*  --   --   --   --   --   HGB 13.5 12.8* 11.6* 10.4* 11.5* 11.7*  HCT 41.1 38.5* 34.3* 30.9* 34.4* 34.7*  MCV 92.6 90.4 90.7 89.3 89.8 88.5  PLT 154 148* 175 125* 162 190   Cardiac Enzymes: Recent Labs  Lab 01/02/18 0209 01/02/18 0623 01/02/18 1221 01/02/18 1624 01/02/18 1934  CKTOTAL  --   --   --  66  --   TROPONINI 3.25* 2.70* 1.48*  --  0.96*   BNP: BNP (last 3 results) Recent Labs    01/02/18 0209  BNP 310.8*    ProBNP (last 3 results) No results for input(s): PROBNP in the last 8760 hours.  CBG: Recent Labs  Lab 01/03/18 1954 01/03/18 2016 01/03/18 2345 01/04/18 0341 01/04/18 0753  GLUCAP 61* 80 75 90 103*       Signed:  Lacretia Nicks MD.  [facility] 01/06/2018, 9:55 PM

## 2018-01-06 NOTE — Clinical Social Work Note (Signed)
Clinical Social Work Assessment  Patient Details  Name: Christian Ellison MRN: [id number] Date of Birth: November 20, 1960  Date of referral:  01/06/18               Reason for consult:  Substance Use/ETOH Abuse, Mental Health Concerns                Permission sought to share information with:    Permission granted to share information::  No  Name::        Agency::     Relationship::     Contact Information:     Housing/Transportation Living arrangements for the past 2 months:  Single Family Home Source of Information:  Patient, Medical Team Patient Interpreter Needed:  None Criminal Activity/Legal Involvement Pertinent to Current Situation/Hospitalization:  No - Comment as needed Significant Relationships:  Adult Children, Parents, Friend, Siblings Lives with:  Self Do you feel safe going back to the place where you live?  Yes Need for family participation in patient care:  No (Coment)  Care giving concerns:  Mental health/substance abuse concerns.   Social Worker assessment / plan:  CSW met with patient. No supports at bedside. CSW introduced role and inquired about needs. Patient agreeable to receiving outpatient resources for mental health and substance abuse therapy. CSW provided list of treatment centers within 15 miles of his home. [place] that [place] his parents farm. He takes care of the animals. Patient is considering moving in with his parents who are 40 and 41 years old so he can provide them more assistance. Patient has three adult children and two sisters. He reports having a very supportive family. Patient's girlfriend passed away a few months ago. He does not suspect it was intentional. Patient states he did not attempt suicide on the day of admission. Patient believes the reason for admission was caused by multiple factors including tick bites and aspiration. Patient stated he knows what suicide can do to families and he would never want to do that to his  own. Patient has being using pain pills and Xanax but does not take them daily. Patient plans to start looking into treatment once he gets home. No further concerns. CSW signing off as social work intervention is no longer needed.   Employment status:  [employer] information:  Self Pay (Medicaid Pending) PT Recommendations:  No Follow Up Information / Referral to community resources:  Outpatient Psychiatric Care (Comment Required), Outpatient Substance Abuse Treatment Options  Patient/Family's Response to care:  Patient agreeable to receiving resources. Patient's family is supportive and involved in patient's care. Patient appreciated social work intervention.  Patient/Family's Understanding of and Emotional Response to Diagnosis, Current Treatment, and Prognosis:  Patient has a good understanding of the reason for admission and social work consult. Patient appears happy with hospital care.  Emotional Assessment Appearance:  Appears stated age Attitude/Demeanor/Rapport:  Engaged, Gracious Affect (typically observed):  Accepting, Appropriate, Calm, Pleasant Orientation:  Oriented to Self, Oriented to Place, Oriented to  Time, Oriented to Situation Alcohol / Substance use:  Illicit Drugs Psych involvement (Current and /or in the community):  No (Comment)  Discharge Needs  Concerns to be addressed:  Substance Abuse Concerns, Mental Health Concerns Readmission within the last 30 days:  No Current discharge risk:  None Barriers to Discharge:  Continued Medical Work up   Candie Chroman, LCSW 01/06/2018, 3:38 PM

## 2018-01-06 NOTE — Care Management Note (Signed)
Case Management Note  Patient Details  Name: ARDIAN HABERLAND MRN: [id number] Date of Birth: Feb 15, 1961  Subjective/Objective:     Overdose            Action/Plan: Patient stated that he goes to [facility] for primary care; his Medicaid application is still pending per patient. Pharmacy of choice is [facility]; patient is very quiet, sadden; emotional support given; patient would like resources where to go for emotional help; SW consult placed.  Expected Discharge Date:     Possibly 01/08/2018             Expected Discharge Plan:   home  In-House Referral:  Clinical Social Work, Nutrition  Discharge planning Services  CM Consult, Follow-up appt scheduled  Status of Service:  In process, will continue to follow  Reola Mosher [phone] 01/06/2018, 12:55 PM

## 2018-01-06 NOTE — Evaluation (Signed)
Physical Therapy Evaluation Patient Details Name: DARON STUTZ MRN: [id number] DOB: 1960/09/10 Today's Date: 01/06/2018   History of Present Illness  57 yo male with admission for overdose of meds with syncopal episode was intubated 5/19 and extubated 5/20, now referred to PT. Has transaminitis, STEMI, acute renal failure, acute metabolic encephalopathy, aspiration PNA.  PMHx:  polysubstance abuse, HTN, chronic pain  Clinical Impression  Pt is up to walk with a drop in O2 sats to 90% although is 92% baseline with room air.  Pt is anticipating dc home and should consider cardiac rehab but PT formally is not needed. Plans to stay with family with accessible home, have canes and walkers should his condition change and will have help as needed from his father as mother is not well.  Follow acutely to progress gait and will dc to home when ready.    Follow Up Recommendations No PT follow up    Equipment Recommendations  None recommended by PT    Recommendations for Other Services       Precautions / Restrictions Precautions Precautions: Fall Restrictions Weight Bearing Restrictions: No      Mobility  Bed Mobility Overal bed mobility: Modified Independent                Transfers Overall transfer level: Modified independent               General transfer comment: used hands on side of bed to stand  Ambulation/Gait Ambulation/Gait assistance: Supervision(for safety) Ambulation Distance (Feet): 150 Feet Assistive device: IV Pole Gait Pattern/deviations: Step-to pattern;Step-through pattern;Wide base of support Gait velocity: reduced Gait velocity interpretation: <1.31 ft/sec, indicative of household ambulator General Gait Details: pt self propelled IV pole but was not dependent on it  Stairs            Wheelchair Mobility    Modified Rankin (Stroke Patients Only)       Balance Overall balance assessment: No apparent balance deficits (not formally  assessed)                                           Pertinent Vitals/Pain Pain Assessment: No/denies pain    Home Living Family/patient expects to be discharged to:: Private residence Living Arrangements: Parent Available Help at Discharge: Family;Available 24 hours/day Type of Home: House Home Access: Ramped entrance     Home Layout: One level Home Equipment: Walker - 2 wheels;Walker - 4 wheels;Cane - single point;Bedside commode Additional Comments: mother and father, mother in poor health    Prior Function Level of Independence: Independent         Comments: was I gait prior to his episode     Hand Dominance   Dominant Hand: Right    Extremity/Trunk Assessment   Upper Extremity Assessment Upper Extremity Assessment: Overall WFL for tasks assessed    Lower Extremity Assessment Lower Extremity Assessment: Overall WFL for tasks assessed    Cervical / Trunk Assessment Cervical / Trunk Assessment: Normal  Communication   Communication: No difficulties  Cognition Arousal/Alertness: Awake/alert Behavior During Therapy: Impulsive Overall Cognitive Status: Within Functional Limits for tasks assessed                                        General Comments General comments (skin integrity,  edema, etc.): has IV in R arm and otherwise no obvious skin changes    Exercises     Assessment/Plan    PT Assessment Patent does not need any further PT services  PT Problem List         PT Treatment Interventions      PT Goals (Current goals can be found in the Care Plan section)  Acute Rehab PT Goals Patient Stated Goal: to go home and feel better PT Goal Formulation: With patient Time For Goal Achievement: 01/13/18 Potential to Achieve Goals: Good    Frequency     Barriers to discharge        Co-evaluation               AM-PAC PT "6 Clicks" Daily Activity  Outcome Measure Difficulty turning over in bed (including  adjusting bedclothes, sheets and blankets)?: None Difficulty moving from lying on back to sitting on the side of the bed? : None Difficulty sitting down on and standing up from a chair with arms (e.g., wheelchair, bedside commode, etc,.)?: A Little Help needed moving to and from a bed to chair (including a wheelchair)?: A Little Help needed walking in hospital room?: A Little Help needed climbing 3-5 steps with a railing? : A Little 6 Click Score: 20    End of Session Equipment Utilized During Treatment: Gait belt Activity Tolerance: Patient tolerated treatment well;Other (comment)(O2 sat dropped to 90% but was 92% baseline) Patient left: in bed;with call bell/phone within reach;with bed alarm set Nurse Communication: Mobility status PT Visit Diagnosis: Other abnormalities of gait and mobility (R26.89);Difficulty in walking, not elsewhere classified (R26.2)    Time: 1610-9604 PT Time Calculation (min) (ACUTE ONLY): 16 min   Charges:   PT Evaluation $PT Eval Moderate Complexity: 1 Mod     PT G Codes:   PT G-Codes **NOT FOR INPATIENT CLASS** Functional Assessment Tool Used: AM-PAC 6 Clicks Basic Mobility    Ivar Drape 01/06/2018, 10:11 AM   Samul Dada, PT MS Acute Rehab Dept. Number: [facility] [id number] and [facility] [phone]

## 2018-01-07 LAB — CULTURE, BLOOD (ROUTINE X 2)
CULTURE: NO GROWTH
Culture: NO GROWTH
SPECIAL REQUESTS: ADEQUATE
Special Requests: ADEQUATE

## 2018-02-02 ENCOUNTER — Inpatient Hospital Stay (HOSPITAL_COMMUNITY)
Admission: EM | Admit: 2018-02-02 | Discharge: 2018-02-03 | DRG: 291 | Disposition: A | Discharge status: Home / Self Care | Payer: Self-pay | Attending: Internal Medicine | Admitting: Internal Medicine

## 2018-02-02 ENCOUNTER — Emergency Department (HOSPITAL_COMMUNITY): Payer: Self-pay

## 2018-02-02 ENCOUNTER — Other Ambulatory Visit: Payer: Self-pay

## 2018-02-02 ENCOUNTER — Inpatient Hospital Stay (HOSPITAL_COMMUNITY): Payer: Self-pay

## 2018-02-02 ENCOUNTER — Encounter (HOSPITAL_COMMUNITY): Payer: Self-pay | Admitting: Emergency Medicine

## 2018-02-02 DIAGNOSIS — R1319 Other dysphagia: Secondary | ICD-10-CM

## 2018-02-02 DIAGNOSIS — R0602 Shortness of breath: Secondary | ICD-10-CM

## 2018-02-02 DIAGNOSIS — J811 Chronic pulmonary edema: Secondary | ICD-10-CM | POA: Diagnosis present

## 2018-02-02 DIAGNOSIS — Z8042 Family history of malignant neoplasm of prostate: Secondary | ICD-10-CM

## 2018-02-02 DIAGNOSIS — R7401 Elevation of levels of liver transaminase levels: Secondary | ICD-10-CM | POA: Diagnosis present

## 2018-02-02 DIAGNOSIS — R112 Nausea with vomiting, unspecified: Secondary | ICD-10-CM | POA: Diagnosis present

## 2018-02-02 DIAGNOSIS — I10 Essential (primary) hypertension: Secondary | ICD-10-CM | POA: Diagnosis present

## 2018-02-02 DIAGNOSIS — K222 Esophageal obstruction: Secondary | ICD-10-CM | POA: Diagnosis present

## 2018-02-02 DIAGNOSIS — Z8619 Personal history of other infectious and parasitic diseases: Secondary | ICD-10-CM

## 2018-02-02 DIAGNOSIS — Z803 Family history of malignant neoplasm of breast: Secondary | ICD-10-CM

## 2018-02-02 DIAGNOSIS — R531 Weakness: Secondary | ICD-10-CM

## 2018-02-02 DIAGNOSIS — I5023 Acute on chronic systolic (congestive) heart failure: Secondary | ICD-10-CM | POA: Diagnosis present

## 2018-02-02 DIAGNOSIS — Z8249 Family history of ischemic heart disease and other diseases of the circulatory system: Secondary | ICD-10-CM

## 2018-02-02 DIAGNOSIS — I509 Heart failure, unspecified: Secondary | ICD-10-CM

## 2018-02-02 DIAGNOSIS — G8929 Other chronic pain: Secondary | ICD-10-CM | POA: Diagnosis present

## 2018-02-02 DIAGNOSIS — J810 Acute pulmonary edema: Secondary | ICD-10-CM

## 2018-02-02 DIAGNOSIS — J189 Pneumonia, unspecified organism: Secondary | ICD-10-CM | POA: Diagnosis present

## 2018-02-02 DIAGNOSIS — Z7982 Long term (current) use of aspirin: Secondary | ICD-10-CM

## 2018-02-02 DIAGNOSIS — Z8052 Family history of malignant neoplasm of bladder: Secondary | ICD-10-CM

## 2018-02-02 DIAGNOSIS — R740 Nonspecific elevation of levels of transaminase and lactic acid dehydrogenase [LDH]: Secondary | ICD-10-CM

## 2018-02-02 DIAGNOSIS — J9601 Acute respiratory failure with hypoxia: Secondary | ICD-10-CM | POA: Diagnosis present

## 2018-02-02 DIAGNOSIS — R748 Abnormal levels of other serum enzymes: Secondary | ICD-10-CM

## 2018-02-02 DIAGNOSIS — K2289 Other specified disease of esophagus: Secondary | ICD-10-CM

## 2018-02-02 DIAGNOSIS — F329 Major depressive disorder, single episode, unspecified: Secondary | ICD-10-CM | POA: Diagnosis present

## 2018-02-02 DIAGNOSIS — Y95 Nosocomial condition: Secondary | ICD-10-CM | POA: Diagnosis present

## 2018-02-02 DIAGNOSIS — Z800 Family history of malignant neoplasm of digestive organs: Secondary | ICD-10-CM

## 2018-02-02 DIAGNOSIS — F1910 Other psychoactive substance abuse, uncomplicated: Secondary | ICD-10-CM

## 2018-02-02 DIAGNOSIS — K219 Gastro-esophageal reflux disease without esophagitis: Secondary | ICD-10-CM | POA: Diagnosis present

## 2018-02-02 DIAGNOSIS — I110 Hypertensive heart disease with heart failure: Principal | ICD-10-CM | POA: Diagnosis present

## 2018-02-02 DIAGNOSIS — R7989 Other specified abnormal findings of blood chemistry: Secondary | ICD-10-CM

## 2018-02-02 DIAGNOSIS — R1310 Dysphagia, unspecified: Secondary | ICD-10-CM

## 2018-02-02 HISTORY — DX: Chronic pulmonary edema: J81.1

## 2018-02-02 LAB — BASIC METABOLIC PANEL
Anion gap: 10 (ref 5–15)
BUN: 10 mg/dL (ref 6–20)
CALCIUM: 8.8 mg/dL — AB (ref 8.9–10.3)
CO2: 30 mmol/L (ref 22–32)
CREATININE: 0.73 mg/dL (ref 0.61–1.24)
Chloride: 96 mmol/L — ABNORMAL LOW (ref 101–111)
GFR calc Af Amer: >60 mL/min (ref >60)
GLUCOSE: 127 mg/dL — AB (ref 65–99)
Potassium: 4.2 mmol/L (ref 3.5–5.1)
Sodium: 136 mmol/L (ref 135–145)

## 2018-02-02 LAB — CBC WITH DIFFERENTIAL/PLATELET
Basophils Absolute: 0 10*3/uL (ref 0.0–0.1)
Basophils Relative: 0 %
EOS ABS: 0 10*3/uL (ref 0.0–0.7)
EOS PCT: 0 %
HCT: 43.8 % (ref 39.0–52.0)
Hemoglobin: 14.3 g/dL (ref 13.0–17.0)
LYMPHS ABS: 0.9 10*3/uL (ref 0.7–4.0)
Lymphocytes Relative: 12 %
MCH: 31.5 pg (ref 26.0–34.0)
MCHC: 32.6 g/dL (ref 30.0–36.0)
MCV: 96.5 fL (ref 78.0–100.0)
Monocytes Absolute: 0.5 10*3/uL (ref 0.1–1.0)
Monocytes Relative: 7 %
Neutro Abs: 6.4 10*3/uL (ref 1.7–7.7)
Neutrophils Relative %: 81 %
PLATELETS: 153 10*3/uL (ref 150–400)
RBC: 4.54 MIL/uL (ref 4.22–5.81)
RDW: 13.8 % (ref 11.5–15.5)
WBC: 7.9 10*3/uL (ref 4.0–10.5)

## 2018-02-02 LAB — HEPATIC FUNCTION PANEL
ALT: 412 U/L — ABNORMAL HIGH (ref 17–63)
AST: 97 U/L — ABNORMAL HIGH (ref 15–41)
Albumin: 4.1 g/dL (ref 3.5–5.0)
Alkaline Phosphatase: 78 U/L (ref 38–126)
BILIRUBIN DIRECT: 0.1 mg/dL (ref 0.1–0.5)
BILIRUBIN INDIRECT: 0.6 mg/dL (ref 0.3–0.9)
Total Bilirubin: 0.7 mg/dL (ref 0.3–1.2)
Total Protein: 7.9 g/dL (ref 6.5–8.1)

## 2018-02-02 LAB — TROPONIN I
TROPONIN I: 0.05 ng/mL — AB (ref <0.03)
TROPONIN I: 0.05 ng/mL — AB (ref <0.03)

## 2018-02-02 LAB — D-DIMER, QUANTITATIVE (NOT AT ARMC): D DIMER QUANT: 0.33 ug{FEU}/mL (ref 0.00–0.50)

## 2018-02-02 LAB — TSH: TSH: 1.256 u[IU]/mL (ref 0.350–4.500)

## 2018-02-02 LAB — BRAIN NATRIURETIC PEPTIDE: B NATRIURETIC PEPTIDE 5: 238 pg/mL — AB (ref 0.0–100.0)

## 2018-02-02 MED ORDER — ENOXAPARIN SODIUM 40 MG/0.4ML ~~LOC~~ SOLN
40.0000 mg | SUBCUTANEOUS | Status: DC
  Filled 2018-02-02: qty 0.4

## 2018-02-02 MED ORDER — VANCOMYCIN HCL 10 G IV SOLR
2000.0000 mg | Freq: Once | INTRAVENOUS | Status: AC
  Administered 2018-02-02: 2000 mg via INTRAVENOUS
  Filled 2018-02-02: qty 2000

## 2018-02-02 MED ORDER — VANCOMYCIN HCL IN DEXTROSE 1-5 GM/200ML-% IV SOLN
1000.0000 mg | Freq: Three times a day (TID) | INTRAVENOUS | Status: DC
  Administered 2018-02-03 (×2): 1000 mg via INTRAVENOUS
  Filled 2018-02-02 (×2): qty 200

## 2018-02-02 MED ORDER — ACETAMINOPHEN 325 MG PO TABS: 650.0000 mg | ORAL_TABLET | Freq: Four times a day (QID) | ORAL | Status: DC | PRN

## 2018-02-02 MED ORDER — PANTOPRAZOLE SODIUM 40 MG PO TBEC
40.0000 mg | DELAYED_RELEASE_TABLET | Freq: Two times a day (BID) | ORAL | Status: DC
  Administered 2018-02-02 – 2018-02-03 (×2): 40 mg via ORAL
  Filled 2018-02-02 (×2): qty 1

## 2018-02-02 MED ORDER — CEFEPIME HCL 1 G IJ SOLR
1.0000 g | Freq: Three times a day (TID) | INTRAMUSCULAR | Status: DC
  Administered 2018-02-02 – 2018-02-03 (×3): 1 g via INTRAVENOUS
  Filled 2018-02-02 (×7): qty 1

## 2018-02-02 MED ORDER — SODIUM CHLORIDE 0.9 % IV SOLN: 250.0000 mL | INTRAVENOUS | Status: DC | PRN

## 2018-02-02 MED ORDER — OMEPRAZOLE MAGNESIUM 20 MG PO TBEC: 40.0000 mg | DELAYED_RELEASE_TABLET | Freq: Every day | ORAL | Status: DC

## 2018-02-02 MED ORDER — FUROSEMIDE 10 MG/ML IJ SOLN
60.0000 mg | Freq: Once | INTRAMUSCULAR | Status: AC
  Administered 2018-02-02: 60 mg via INTRAVENOUS

## 2018-02-02 MED ORDER — FUROSEMIDE 10 MG/ML IJ SOLN
INTRAMUSCULAR | Status: AC
  Filled 2018-02-02: qty 8

## 2018-02-02 MED ORDER — SODIUM CHLORIDE 0.9 % IV BOLUS
1000.0000 mL | Freq: Once | INTRAVENOUS | Status: AC
  Administered 2018-02-02: 1000 mL via INTRAVENOUS

## 2018-02-02 MED ORDER — AMLODIPINE BESYLATE 5 MG PO TABS
5.0000 mg | ORAL_TABLET | Freq: Every day | ORAL | Status: DC
  Administered 2018-02-03: 5 mg via ORAL
  Filled 2018-02-02: qty 1

## 2018-02-02 MED ORDER — SODIUM CHLORIDE 0.9% FLUSH: 3.0000 mL | INTRAVENOUS | Status: DC | PRN

## 2018-02-02 MED ORDER — SODIUM CHLORIDE 0.9% FLUSH
3.0000 mL | Freq: Two times a day (BID) | INTRAVENOUS | Status: DC
  Administered 2018-02-02 – 2018-02-03 (×2): 3 mL via INTRAVENOUS

## 2018-02-02 MED ORDER — ASPIRIN EC 81 MG PO TBEC
81.0000 mg | DELAYED_RELEASE_TABLET | Freq: Every day | ORAL | Status: DC
  Administered 2018-02-03: 81 mg via ORAL
  Filled 2018-02-02: qty 1

## 2018-02-02 MED ORDER — METOPROLOL TARTRATE 25 MG PO TABS
25.0000 mg | ORAL_TABLET | Freq: Two times a day (BID) | ORAL | Status: DC
  Administered 2018-02-02 – 2018-02-03 (×2): 25 mg via ORAL
  Filled 2018-02-02 (×2): qty 1

## 2018-02-02 MED ORDER — ONDANSETRON HCL 4 MG/2ML IJ SOLN: 4.0000 mg | Freq: Four times a day (QID) | INTRAMUSCULAR | Status: DC | PRN

## 2018-02-02 NOTE — ED Triage Notes (Addendum)
Pt was recently discharged from hospital.  Pt c/o of waking up with n/v, difficulty swallowing, and weakness. Pt denies sob or wearing oxygen at home.  Pt is 87% on room air

## 2018-02-02 NOTE — ED Notes (Signed)
Date and time results received: 02/02/18 1454  Test: Troponin Critical Value: 0.05  Name of Provider Notified: Dr. Estell HarpinZammit  Orders Received? Or Actions Taken?: See chart

## 2018-02-02 NOTE — H&P (Signed)
History and Physical  Christian Ellison ZOX:[id number] DOB: 1960-08-25 DOA: 02/02/2018  Referring physician: Estell Harpin, MD  PCP: Elfredia Nevins, MD   Chief Complaint: weakness, SOB and difficulty swallowing  HPI: Christian Ellison is a 57 y.o. male with Hep C, esophageal stricture (mild per esophagram), history of opioid and polysubstance abuse, CAD who was recently discharged from [facility] last month with history of respiratory failure related to aspiration pneumonia thought secondary to drug overdose of opioids and benzodiazepines.  He was thought to have had an NSTEMI and was seen by cardiology.  He ended up intubated in the ICU for at least 3 days.  He was treated for pneumonia and hypertension.  He has not followed up with GI or cardiology at this time.  He was supposed to see GI for treatment of esophageal stricture and GERD.   He was noted to have elevated liver enzymes and noted to have a hepatitis panel positive for Hep C. His viral load testing was still pending at the time of his discharge.    He was also diagnosed with depression and started on celexa and arranged for outpatient behavioral health follow up.  He does not seem to be taking the celexa at this time.    The patient presented to the ED today with complaints of waking up with nausea and vomiting.  He reports ongoing difficulty swallowing associated with his esophageal stricture, uncontrolled GERD symptoms, weakness and shortness of breath.  He was noted to be hypoxic on room air with a pulse ox of 87%.  He was placed on 2L/min oxygen and his oxygenation has improved.   He had a chest xray that revealed some mild pulmonary edema and his troponin was slightly elevated at 0.05.  He received a 1000 mL bolus of fluids in ED and admission was requested.  His chest xray suggested that he may have some underlying pneumonia.  The patient says that he had completed his course of treatment for pneumonia after last hospitalization.  The  patient is being admitted with acute respiratory distress with hypoxia, uncontrolled GERD with esophageal stricture and acute congestive heart failure.     Review of Systems: All systems reviewed and apart from history of presenting illness, are negative.  Past Medical History:  Diagnosis Date  . Chronic pain   . Hepatitis   . Opiate abuse, continuous (HCC)    Past Surgical History:  Procedure Laterality Date  . COLONOSCOPY N/A 11/22/2013   Procedure: COLONOSCOPY;  Surgeon: Malissa Hippo, MD;  [facility];  Service: Endoscopy;  Laterality: N/A;  1030  . Fracture Right Leg     Patient has rod/screws in this leg  . Rotor Cuff  2012   Right Shoulder   Social History:  reports that he has never smoked. He has never used smokeless tobacco. He reports that he has current or past drug history. He reports that he does not drink alcohol.  No Known Allergies  Family History  Problem Relation Age of Onset  . Breast cancer Mother   . Colon cancer Father   . Bladder Cancer Father   . Prostate cancer Father   . Hypertension Sister   . Hypothyroidism Sister   . Healthy Sister   . Healthy Daughter   . Healthy Son   . Healthy Son     Prior to Admission medications   Medication Sig Start Date End Date Taking? Authorizing Provider  acetaminophen (TYLENOL) 650 MG CR tablet  Take 650 mg by mouth every 8 (eight) hours as needed for pain.   Yes [provider]  amLODipine (NORVASC) 5 MG tablet Take 1 tablet (5 mg total) by mouth daily. 01/06/18 01/06/19 Yes Zigmund Daniel., MD  aspirin 81 MG EC tablet Take 1 tablet (81 mg total) by mouth daily. 01/07/18 02/06/18 Yes Zigmund Daniel., MD  metoprolol tartrate (LOPRESSOR) 25 MG tablet Take 1 tablet (25 mg total) by mouth 2 (two) times daily. 01/06/18 02/05/18 Yes Zigmund Daniel., MD  omeprazole (PRILOSEC OTC) 20 MG tablet Take 2 tablets (40 mg total) by mouth daily. 01/06/18 02/05/18 Yes Zigmund Daniel., MD    Physical Exam: Vitals:   02/02/18 1353 02/02/18 1354 02/02/18 1500  BP: 131/87  130/78  Pulse: 99  88  Resp: 16  16  Temp: 98.8 F (37.1 C)    TempSrc: Oral    SpO2: (!) 87% (!) 87% 94%  Weight: 83.9 kg (185 lb)    Height: 6' (1.829 m)       General exam: Moderately built and nourished patient, lying comfortably supine on the gurney in no obvious distress. Pt appears lethargic and possibly under influence of recreational substance.  He is able to answer questions completely.   Head, eyes and ENT: Nontraumatic and normocephalic. Pupils equally reacting to light and accommodation. Oral mucosa moist.  Neck: Supple. No JVD, carotid bruit or thyromegaly.  Lymphatics: No lymphadenopathy.  Respiratory system: bibasilar crackles, rales LUL heard. No increased work of breathing.  Cardiovascular system: normal S1 and S2 heard. No JVD, murmurs, gallops, clicks or pedal edema.  Gastrointestinal system: Abdomen is nondistended, soft and nontender. Normal bowel sounds heard. No organomegaly or masses appreciated.  Central nervous system: Alert and oriented. No focal neurological deficits.  Extremities: Symmetric 5 x 5 power. Peripheral pulses symmetrically felt.   Skin: No rashes or acute findings.  Musculoskeletal system: Negative exam.  Psychiatry: Pleasant and cooperative.  Labs on Admission:  Basic Metabolic Panel: Recent Labs  Lab 02/02/18 1355  NA 136  K 4.2  CL 96*  CO2 30  GLUCOSE 127*  BUN 10  CREATININE 0.73  CALCIUM 8.8*   Liver Function Tests: Recent Labs  Lab 02/02/18 1407  AST 97*  ALT 412*  ALKPHOS 78  BILITOT 0.7  PROT 7.9  ALBUMIN 4.1   No results for input(s): LIPASE, AMYLASE in the last 168 hours. No results for input(s): AMMONIA in the last 168 hours. CBC: Recent Labs  Lab 02/02/18 1355  WBC 7.9  NEUTROABS 6.4  HGB 14.3  HCT 43.8  MCV 96.5  PLT 153   Cardiac Enzymes: Recent Labs  Lab 02/02/18 1355  TROPONINI 0.05*    BNP  (last 3 results) No results for input(s): PROBNP in the last 8760 hours. CBG: No results for input(s): GLUCAP in the last 168 hours.  Radiological Exams on Admission: Dg Chest 2 View  Result Date: 02/02/2018 CLINICAL DATA:  Decreased oxygen saturation. The patient awakened with nausea, vomiting, difficulty swallowing, and weakness. History of aspiration pneumonia. EXAM: CHEST - 2 VIEW COMPARISON:  Chest x-ray of Jan 05, 2018 FINDINGS: The lungs are well-expanded. The interstitial markings are mildly increased. There is no alveolar infiltrate or pleural effusion. The heart and pulmonary vascularity are normal. The mediastinum is normal in width. The bony thorax is unremarkable. IMPRESSION: Mildly increased lung markings bilaterally may reflect early interstitial pneumonia or atypical pulmonary edema. No definite evidence of aspiration. Follow-up radiographs  following anticipated antibiotic therapy are recommended to assure clearing. Electronically Signed   By: David  SwazilandJordan M.D.   On: 02/02/2018 14:40   EKG: Personally reviewed. Normal sinus rhythm, no acute ST/T abnormalities.   Assessment/Plan Principal Problem:   Acute respiratory failure with hypoxia (HCC) Active Problems:   Transaminitis   Elevated troponin   Pulmonary edema   HCAP (healthcare-associated pneumonia)   Weakness generalized   Hypertension   CHF (congestive heart failure) (HCC)   Nausea & vomiting   Dysphagia  1. Acute respiratory failure with hypoxia - The patient has improved with supplemental oxygen.  The first thing that should be tested is a urine toxicology screen which has not been done yet and will order it now.   He has CHF and HCAP and treatment has been started for that.  Follow clinically.  De-escalate antibiotics as culture results come back.   2. Pulmonary edema / CHF - pt had a recent Echo last month with normal systolic function.  Will not re-order echo at this time,  Iv lasix ordered, follow weight, output  and decide if further lasix is needed.  3. HCAP - given recent hospitalization would treat for HCAP, Follow cultures and de-escalate treatment as soon as appropriate.  4. Polysubstance abuse - check a urine toxicology screen.   5. Esophageal stricture - Pt says that he is becoming more symptomatic from this. Will ask for GI evaluation.  6. Dysphagia - suspect related to esophageal stricture and uncontrolled GERD symptoms.    7. GERD - Pt reports that symptoms are uncontrolled.  Protonix BID ordered.  8. Hepatitis C - viral load pending.  GI consult.   9. Elevated transaminases - ALT is trending down [facility].   10. Nausea and vomiting - Zofran ordered as needed.  Treating GERD aggressively.  11. Essential Hypertension - He was started on amlodipine and metoprolol during recent hospitalization but it is unclear if he is taking these medications.   12. Depression - likely it was situational as he stopped taking the celexa when he was discharged and he denies feelings of sadness, SI/HI at this time.    DVT Prophylaxis: lovenox  Code Status: Full   Family Communication: gf at bedside  Disposition Plan: Inpatient treatment and subspecialty consultation   Time spent: 59 minutes  Christian Dakinslanford Chamya Hunton, MD [facility] Pager [phone]  If 7PM-7AM, please contact night-coverage [email or web address] Password [email or web address] 02/02/2018, 3:55 PM

## 2018-02-02 NOTE — ED Provider Notes (Signed)
[facility] EMERGENCY DEPARTMENT Provider Note   CSN: [id number] Arrival date & time: 02/02/18  1344     History   Chief Complaint Chief Complaint  Patient presents with  . Emesis    HPI Christian Ellison is a 57 y.o. male.  Patient complains of vomiting a few times and being weak last couple days.  The history is provided by the patient. No language interpreter was used.  Weakness  Primary symptoms include no focal weakness. This is a new problem. The current episode started 2 days ago. The problem has not changed since onset.There was no focality noted. There has been no fever. Associated symptoms include shortness of breath. Pertinent negatives include no chest pain and no headaches. There were no medications administered prior to arrival. Associated medical issues do not include trauma.    Past Medical History:  Diagnosis Date  . Chronic pain   . Hepatitis   . Opiate abuse, continuous [facility])     Patient Active Problem List   Diagnosis Date Noted  . Pulmonary edema 02/02/2018  . HCAP (healthcare-associated pneumonia) 02/02/2018  . Weakness generalized 02/02/2018  . Acute respiratory failure with hypoxia (HCC)   . Aspiration pneumonia (HCC)   . Transaminitis   . Elevated troponin   . Overdose 01/02/2018  . Hx of hepatitis C 01/17/2013  . Depression 01/17/2013  . Anxiety 01/17/2013  . Right knee pain 01/17/2013    Past Surgical History:  Procedure Laterality Date  . COLONOSCOPY N/A 11/22/2013   Procedure: COLONOSCOPY;  Surgeon: Malissa Hippo, MD;  Location[facility];  Service: Endoscopy;  Laterality: N/A;  1030  . Fracture Right Leg     Patient has rod/screws in this leg  . Rotor Cuff  2012   Right Shoulder        Home Medications    Prior to Admission medications   Medication Sig Start Date End Date Taking? Authorizing Provider  acetaminophen (TYLENOL) 650 MG CR tablet Take 650 mg by mouth every 8 (eight) hours as needed for pain.   Yes  [provider]  amLODipine (NORVASC) 5 MG tablet Take 1 tablet (5 mg total) by mouth daily. 01/06/18 01/06/19 Yes Zigmund Daniel., MD  aspirin 81 MG EC tablet Take 1 tablet (81 mg total) by mouth daily. 01/07/18 02/06/18 Yes Zigmund Daniel., MD  metoprolol tartrate (LOPRESSOR) 25 MG tablet Take 1 tablet (25 mg total) by mouth 2 (two) times daily. 01/06/18 02/05/18 Yes Zigmund Daniel., MD  omeprazole (PRILOSEC OTC) 20 MG tablet Take 2 tablets (40 mg total) by mouth daily. 01/06/18 02/05/18 Yes Zigmund Daniel., MD  citalopram (CELEXA) 20 MG tablet Take 1 tablet (20 mg total) by mouth daily. 01/06/18 02/05/18  Zigmund Daniel., MD    Family History Family History  Problem Relation Age of Onset  . Breast cancer Mother   . Colon cancer Father   . Bladder Cancer Father   . Prostate cancer Father   . Hypertension Sister   . Hypothyroidism Sister   . Healthy Sister   . Healthy Daughter   . Healthy Son   . Healthy Son     Social History Social History   Tobacco Use  . Smoking status: Never Smoker  . Smokeless tobacco: Never Used  Substance Use Topics  . Alcohol use: No    Comment: Patient states that it has been over a year  . Drug use: Yes  Comment: opiates     Allergies   Patient has no known allergies.   Review of Systems Review of Systems  Constitutional: Negative for appetite change and fatigue.  HENT: Negative for congestion, ear discharge and sinus pressure.   Eyes: Negative for discharge.  Respiratory: Positive for shortness of breath. Negative for cough.   Cardiovascular: Negative for chest pain.  Gastrointestinal: Negative for abdominal pain and diarrhea.  Genitourinary: Negative for frequency and hematuria.  Musculoskeletal: Negative for back pain.  Skin: Negative for rash.  Neurological: Positive for weakness. Negative for focal weakness, seizures and headaches.  Psychiatric/Behavioral: Negative for hallucinations.      Physical Exam Updated Vital Signs BP 130/78   Pulse 88   Temp 98.8 F (37.1 C) (Oral)   Resp 16   Ht 6' (1.829 m)   Wt 83.9 kg (185 lb)   SpO2 94%   BMI 25.09 kg/m   Physical Exam  Constitutional: He is oriented to person, place, and time. He appears well-developed.  HENT:  Head: Normocephalic.  Eyes: Conjunctivae and EOM are normal. No scleral icterus.  Neck: Neck supple. No thyromegaly present.  Cardiovascular: Normal rate and regular rhythm. Exam reveals no gallop and no friction rub.  No murmur heard. Pulmonary/Chest: No stridor. He has no wheezes. He has no rales. He exhibits no tenderness.  Abdominal: He exhibits no distension. There is no tenderness. There is no rebound.  Musculoskeletal: Normal range of motion. He exhibits no edema.  Lymphadenopathy:    He has no cervical adenopathy.  Neurological: He is oriented to person, place, and time. He exhibits normal muscle tone. Coordination normal.  Skin: No rash noted. No erythema.  Psychiatric: He has a normal mood and affect. His behavior is normal.     ED Treatments / Results  Labs (all labs ordered are listed, but only abnormal results are displayed) Labs Reviewed  TROPONIN I - Abnormal; Notable for the following components:      Result Value   Troponin I 0.05 (*)    All other components within normal limits  BASIC METABOLIC PANEL - Abnormal; Notable for the following components:   Chloride 96 (*)    Glucose, Bld 127 (*)    Calcium 8.8 (*)    All other components within normal limits  HEPATIC FUNCTION PANEL - Abnormal; Notable for the following components:   AST 97 (*)    ALT 412 (*)    All other components within normal limits  BRAIN NATRIURETIC PEPTIDE - Abnormal; Notable for the following components:   B Natriuretic Peptide 238.0 (*)    All other components within normal limits  CBC WITH DIFFERENTIAL/PLATELET  D-DIMER, QUANTITATIVE (NOT AT [facility])    EKG EKG  Interpretation  Date/Time:  Wednesday February 02 2018 14:00:52 EDT Ventricular Rate:  98 PR Interval:    QRS Duration: 82 QT Interval:  340 QTC Calculation: 435 R Axis:   36 Text Interpretation:  Sinus rhythm Borderline ST elevation, anterior leads Confirmed by Bethann Berkshire [phone]) on 02/02/2018 2:07:53 PM   Radiology Dg Chest 2 View  Result Date: 02/02/2018 CLINICAL DATA:  Decreased oxygen saturation. The patient awakened with nausea, vomiting, difficulty swallowing, and weakness. History of aspiration pneumonia. EXAM: CHEST - 2 VIEW COMPARISON:  Chest x-ray of Jan 05, 2018 FINDINGS: The lungs are well-expanded. The interstitial markings are mildly increased. There is no alveolar infiltrate or pleural effusion. The heart and pulmonary vascularity are normal. The mediastinum is normal in width. The bony thorax is  unremarkable. IMPRESSION: Mildly increased lung markings bilaterally may reflect early interstitial pneumonia or atypical pulmonary edema. No definite evidence of aspiration. Follow-up radiographs following anticipated antibiotic therapy are recommended to assure clearing. Electronically Signed   By: David  SwazilandJordan M.D.   On: 02/02/2018 14:40    Procedures Procedures (including critical care time)  Medications Ordered in ED Medications  furosemide (LASIX) injection 60 mg (has no administration in time range)  sodium chloride 0.9 % bolus 1,000 mL (1,000 mLs Intravenous New Bag/Given 02/02/18 1421)     Initial Impression / Assessment and Plan / ED Course  I have reviewed the triage vital signs and the nursing notes.  Pertinent labs & imaging results that were available during my care of the patient were reviewed by me and considered in my medical decision making (see chart for details).     Patient is hypoxia.  Possible pneumonia or heart failure.  He will be admitted to medicine for treatment  Final Clinical Impressions(s) / ED Diagnoses   Final diagnoses:  SOB (shortness  of breath)    ED Discharge Orders    None       Bethann BerkshireZammit, Kanija Remmel, MD 02/02/18 1548

## 2018-02-02 NOTE — Progress Notes (Addendum)
Pharmacy Antibiotic Note  Raelyn EnsignJohn G Penna is a 57 y.o. male admitted on 02/02/2018 with pneumonia.  Pharmacy has been consulted for Vancomycin dosing.  Plan: Vancomycin 2000 mg IV x 1 dose Vancomycin 1000 mg IV every 8 hours.  Goal trough 15-20 mcg/mL.  Cefepime 1000 mg IV every 8 hours Monitor labs, c/s, and vanco trough as indicated    Height: 6' (182.9 cm) Weight: 185 lb (83.9 kg) IBW/kg (Calculated) : 77.6  Temp (24hrs), Avg:98.8 F (37.1 C), Min:98.8 F (37.1 C), Max:98.8 F (37.1 C)  Recent Labs  Lab 02/02/18 1355  WBC 7.9  CREATININE 0.73    Estimated Creatinine Clearance: 113.2 mL/min (by C-G formula based on SCr of 0.73 mg/dL).    No Known Allergies  Antimicrobials this admission: Vanco 6/19 >>  Cefepime 6/19 >>     Dose adjustments this admission: N/A  Microbiology results: 6/19 BCx: pending  Thank you for allowing pharmacy to be a part of this patient's care.  Tad MooreSteven C Ebbie Sorenson 02/02/2018 4:09 PM

## 2018-02-03 ENCOUNTER — Inpatient Hospital Stay (HOSPITAL_COMMUNITY): Payer: Self-pay

## 2018-02-03 DIAGNOSIS — J9601 Acute respiratory failure with hypoxia: Secondary | ICD-10-CM

## 2018-02-03 DIAGNOSIS — K222 Esophageal obstruction: Secondary | ICD-10-CM

## 2018-02-03 DIAGNOSIS — J189 Pneumonia, unspecified organism: Secondary | ICD-10-CM

## 2018-02-03 DIAGNOSIS — R1310 Dysphagia, unspecified: Secondary | ICD-10-CM

## 2018-02-03 LAB — COMPREHENSIVE METABOLIC PANEL
ALT: 282 U/L — AB (ref 17–63)
AST: 78 U/L — AB (ref 15–41)
Albumin: 3.5 g/dL (ref 3.5–5.0)
Alkaline Phosphatase: 62 U/L (ref 38–126)
Anion gap: 9 (ref 5–15)
BUN: 14 mg/dL (ref 6–20)
CHLORIDE: 102 mmol/L (ref 101–111)
CO2: 30 mmol/L (ref 22–32)
CREATININE: 0.69 mg/dL (ref 0.61–1.24)
Calcium: 8.4 mg/dL — ABNORMAL LOW (ref 8.9–10.3)
GFR calc non Af Amer: >60 mL/min (ref >60)
Glucose, Bld: 131 mg/dL — ABNORMAL HIGH (ref 65–99)
Potassium: 3.7 mmol/L (ref 3.5–5.1)
SODIUM: 141 mmol/L (ref 135–145)
Total Bilirubin: 0.7 mg/dL (ref 0.3–1.2)
Total Protein: 7.1 g/dL (ref 6.5–8.1)

## 2018-02-03 LAB — URINALYSIS, ROUTINE W REFLEX MICROSCOPIC
BILIRUBIN URINE: NEGATIVE
GLUCOSE, UA: NEGATIVE mg/dL
HGB URINE DIPSTICK: NEGATIVE
Ketones, ur: NEGATIVE mg/dL
Leukocytes, UA: NEGATIVE
Nitrite: NEGATIVE
Protein, ur: NEGATIVE mg/dL
Specific Gravity, Urine: 1.023 (ref 1.005–1.030)
pH: 5 (ref 5.0–8.0)

## 2018-02-03 LAB — RAPID URINE DRUG SCREEN, HOSP PERFORMED
AMPHETAMINES: NOT DETECTED
BENZODIAZEPINES: POSITIVE — AB
Cocaine: NOT DETECTED
Opiates: NOT DETECTED
Tetrahydrocannabinol: POSITIVE — AB

## 2018-02-03 LAB — CBC WITH DIFFERENTIAL/PLATELET
BASOS ABS: 0 10*3/uL (ref 0.0–0.1)
BASOS PCT: 0 %
EOS ABS: 0.2 10*3/uL (ref 0.0–0.7)
EOS PCT: 4 %
HCT: 41.7 % (ref 39.0–52.0)
Hemoglobin: 13.4 g/dL (ref 13.0–17.0)
Lymphocytes Relative: 27 %
Lymphs Abs: 1.3 10*3/uL (ref 0.7–4.0)
MCH: 30.9 pg (ref 26.0–34.0)
MCHC: 32.1 g/dL (ref 30.0–36.0)
MCV: 96.1 fL (ref 78.0–100.0)
Monocytes Absolute: 0.6 10*3/uL (ref 0.1–1.0)
Monocytes Relative: 12 %
Neutro Abs: 2.7 10*3/uL (ref 1.7–7.7)
Neutrophils Relative %: 57 %
PLATELETS: 124 10*3/uL — AB (ref 150–400)
RBC: 4.34 MIL/uL (ref 4.22–5.81)
RDW: 13.5 % (ref 11.5–15.5)
WBC: 4.8 10*3/uL (ref 4.0–10.5)

## 2018-02-03 LAB — BRAIN NATRIURETIC PEPTIDE: B NATRIURETIC PEPTIDE 5: 92 pg/mL (ref 0.0–100.0)

## 2018-02-03 LAB — STREP PNEUMONIAE URINARY ANTIGEN: STREP PNEUMO URINARY ANTIGEN: NEGATIVE

## 2018-02-03 LAB — TROPONIN I
TROPONIN I: 0.04 ng/mL — AB (ref <0.03)
Troponin I: 0.04 ng/mL (ref <0.03)

## 2018-02-03 LAB — MAGNESIUM: Magnesium: 2.1 mg/dL (ref 1.7–2.4)

## 2018-02-03 MED ORDER — AMLODIPINE BESYLATE 5 MG PO TABS: 5.0000 mg | ORAL_TABLET | Freq: Every day | ORAL | 0 refills | Status: DC

## 2018-02-03 MED ORDER — METOPROLOL TARTRATE 25 MG PO TABS: 25.0000 mg | ORAL_TABLET | Freq: Two times a day (BID) | ORAL | 0 refills | Status: DC

## 2018-02-03 MED ORDER — PIPERACILLIN-TAZOBACTAM 3.375 G IVPB
3.3750 g | Freq: Three times a day (TID) | INTRAVENOUS | Status: DC
  Administered 2018-02-03: 3.375 g via INTRAVENOUS
  Filled 2018-02-03: qty 50

## 2018-02-03 MED ORDER — AMOXICILLIN-POT CLAVULANATE 875-125 MG PO TABS: 1.0000 | ORAL_TABLET | Freq: Two times a day (BID) | ORAL | 0 refills | Status: AC

## 2018-02-03 MED ORDER — ESOMEPRAZOLE MAGNESIUM 40 MG PO CPDR: 40.0000 mg | DELAYED_RELEASE_CAPSULE | Freq: Every day | ORAL | 1 refills | Status: DC

## 2018-02-03 NOTE — Evaluation (Signed)
Physical Therapy Evaluation Patient Details Name: Christian Ellison MRN: [id number] DOB: [birth year] Today's Date: 02/03/2018   History of Present Illness  Christian Ellison Christian Ellison is a 57 y.o. male with Hep C, esophageal stricture (mild per esophagram), history of opioid and polysubstance abuse, CAD who was recently discharged from [facility] last month with history of respiratory failure related to aspiration pneumonia thought secondary to drug overdose of opioids and benzodiazepines.  He was thought to have had an NSTEMI and was seen by cardiology.  He ended up intubated in the ICU for at least 3 days.  He was treated for pneumonia and hypertension.  He has not followed up with GI or cardiology at this time.  He was supposed to see GI for treatment of esophageal stricture and GERD.     Clinical Impression  Patient functioning at baseline for functional mobility and gait.  Plan:  Patient discharged from physical therapy to care of nursing for ambulation daily as tolerated for length of stay.    Follow Up Recommendations No PT follow up    Equipment Recommendations  None recommended by PT    Recommendations for Other Services       Precautions / Restrictions Precautions Precautions: None Restrictions Weight Bearing Restrictions: No      Mobility  Bed Mobility Overal bed mobility: Independent                Transfers Overall transfer level: Independent                  Ambulation/Gait Ambulation/Gait assistance: Independent Gait Distance (Feet): 200 Feet Assistive device: None Gait Pattern/deviations: WFL(Within Functional Limits) Gait velocity: normal   General Gait Details: demonstrates good return for ambulation on level, inclined, and declined surfaces without loss of balance  Stairs            Wheelchair Mobility    Modified Rankin (Stroke Patients Only)       Balance Overall balance assessment: No apparent balance deficits (not formally  assessed)                                           Pertinent Vitals/Pain Pain Assessment: No/denies pain    Home Living Family/patient expects to be discharged to:: Private residence Living Arrangements: Spouse/significant other Available Help at Discharge: Family;Available 24 hours/day Type of Home: House Home Access: Ramped entrance     Home Layout: One level Home Equipment: Cane - single point;Walker - 2 wheels      Prior Function Level of Independence: Independent         Comments: community ambulator, drives     Hand Dominance   Dominant Hand: Right    Extremity/Trunk Assessment   Upper Extremity Assessment Upper Extremity Assessment: Overall WFL for tasks assessed    Lower Extremity Assessment Lower Extremity Assessment: Overall WFL for tasks assessed    Cervical / Trunk Assessment Cervical / Trunk Assessment: Normal  Communication   Communication: No difficulties  Cognition Arousal/Alertness: Awake/alert Behavior During Therapy: WFL for tasks assessed/performed Overall Cognitive Status: Within Functional Limits for tasks assessed                                        General Comments      Exercises  Assessment/Plan    PT Assessment Patent does not need any further PT services  PT Problem List         PT Treatment Interventions      PT Goals (Current goals can be found in the Care Plan section)  Acute Rehab PT Goals Patient Stated Goal: return home PT Goal Formulation: With patient/family Time For Goal Achievement: 02/03/18 Potential to Achieve Goals: Good    Frequency     Barriers to discharge        Co-evaluation               AM-PAC PT "6 Clicks" Daily Activity  Outcome Measure Difficulty turning over in bed (including adjusting bedclothes, sheets and blankets)?: None Difficulty moving from lying on back to sitting on the side of the bed? : None Difficulty sitting down on and  standing up from a chair with arms (e.g., wheelchair, bedside commode, etc,.)?: None Help needed moving to and from a bed to chair (including a wheelchair)?: None Help needed walking in hospital room?: None Help needed climbing 3-5 steps with a railing? : None 6 Click Score: 24    End of Session   Activity Tolerance: Patient tolerated treatment well Patient left: in bed;with call bell/phone within reach;with family/visitor present(seated at bedside) Nurse Communication: Mobility status PT Visit Diagnosis: Unsteadiness on feet (R26.81);Other abnormalities of gait and mobility (R26.89);Muscle weakness (generalized) (M62.81)    Time: [id number] PT Time Calculation (min) (ACUTE ONLY): 23 min   Charges:   PT Evaluation $PT Eval Low Complexity: 1 Low PT Treatments $Gait Training: 23-37 mins   PT G Codes:        3:53 PM, 02/03/18 Ocie BobJames Shalon Salado, MPT Physical Therapist with [facility] [address] [phone] office [phone] mobile phone

## 2018-02-03 NOTE — Progress Notes (Signed)
Pharmacy Antibiotic Note  Christian EnsignJohn G Ellison is a 57 y.o. male admitted on 02/02/2018 with possible aspiration pneumonia.  Pharmacy has been consulted for Vancomycin and zosyn dosing.  Plan: Vancomycin 2000 mg IV x 1 dose Vancomycin 1000 mg IV every 8 hours.  Goal trough 15-20 mcg/mL.  Zosyn 3.375gm IV q8h EID over 4 hours F/U cxs and clinical progress Monitor labs, c/s, and vanco trough as indicated    Height: 6' (182.9 cm) Weight: 178 lb 8 oz (81 kg) IBW/kg (Calculated) : 77.6  Temp (24hrs), Avg:98.6 F (37 C), Min:97.7 F (36.5 C), Max:99.1 F (37.3 C)  Recent Labs  Lab 02/02/18 1355 02/03/18 0507  WBC 7.9 4.8  CREATININE 0.73 0.69    Estimated Creatinine Clearance: 113.2 mL/min (by C-G formula based on SCr of 0.69 mg/dL).    No Known Allergies  Antimicrobials this admission: Vanco 6/19 >>  Cefepime 6/19 >>6/20 Zosyn 6/20>>    Dose adjustments this admission: N/A  Microbiology results: 6/19 BCx: ngtd  Thank you for allowing pharmacy to be a part of this patient's care.  Elder CyphersLorie Nataly Pacifico, BS Pharm D, [place] Clinical Pharmacist Pager [phone] 02/03/2018 10:28 AM

## 2018-02-03 NOTE — Progress Notes (Signed)
SLP Cancellation Note  Patient Details Name: Christian EnsignJohn G Ellison MRN: [id number] DOB: 10/23/1960   Cancelled treatment:       Reason Eval/Treat Not Completed: SLP screened, no needs identified, will sign off; pt denies dysphagia, stated he "had a sore throat" when admitted, but has resolved; f/u with GI at d/c; discussed esophageal precautions with pt. ST will s/o at this time.   Tressie StalkerPat Loriana Samad, M.S., CCC-SLP 02/03/2018, 2:31 PM

## 2018-02-03 NOTE — Progress Notes (Signed)
IV removed per patient. Pt very ancy to leave almost left AMA. D/C instructions given to patient. Verbalized understanding, pt spouse to transport home.

## 2018-02-03 NOTE — Discharge Summary (Signed)
Physician Discharge Summary  Christian Ellison ZOX:[id number] DOB: 16-Dec-1960 DOA: 02/02/2018  PCP: Elfredia Nevins, MD  Admit date: 02/02/2018 Discharge date: 02/03/2018  Admitted From: Home Disposition: Home  Recommendations for Outpatient Follow-up:  1. Follow up with PCP in 1-2 weeks 2. Please obtain BMP/CBC in one week 3. Follow-up scheduled with cardiology 4. Follow-up scheduled with GI  Home Health: Equipment/Devices:  Discharge Condition: Improved CODE STATUS: Full code Diet recommendation: Heart healthy diet  Brief/Interim Summary: 57 year old male with a history of esophageal stricture, polysubstance abuse in the past, recent admission to [facility] when he was admitted for aspiration pneumonia and had respiratory failure.  At that time he was noted to have elevated troponin.  He was discharged home with a course of antibiotics.  Patient came back to the hospital with complaints of shortness of breath, generalized weakness and difficulty swallowing.  He was felt that persistent pneumonia and was continued on intravenous antibiotics.  The following day, patient feels significantly improved.  He was able to ambulate across the entire unit without difficulty.  He did not require any oxygen and did not appear short of breath.  He did not have any fever.  He will be continued on a course of oral antibiotics.  Regarding his swallowing, he does have known esophageal stricture, but denies any difficulty swallowing at this time.  Arrangements have been made for him to follow-up with gastroenterology as an outpatient to be considered for esophageal dilatation.  Review of last hospitalization did note cardiology evaluation at that time and it was felt that he would need outpatient evaluation once stable.  Since he did have a significant rise in troponin, may need stress test versus cardiac cath.  Follow-up has been scheduled with cardiology office in [facility].  Patient is  significantly better and is very insistent on being discharged home today.  She will be continued on proton pump inhibitors for GERD.  He will continue course of antibiotics at home.  Remainder of his medications have not been changed.  Discharge Diagnoses:  Principal Problem:   Acute respiratory failure with hypoxia (HCC) Active Problems:   Transaminitis   Elevated troponin   Pulmonary edema   HCAP (healthcare-associated pneumonia)   Weakness generalized   Hypertension   CHF (congestive heart failure) (HCC)   Nausea & vomiting   Dysphagia   Esophageal stricture   Esophageal stenosis   GERD (gastroesophageal reflux disease)   Polysubstance abuse [facility])    Discharge Instructions  Discharge Instructions    Diet - low sodium heart healthy   Complete by:  As directed    Increase activity slowly   Complete by:  As directed      Allergies as of 02/03/2018   No Known Allergies     Medication List    STOP taking these medications   omeprazole 20 MG tablet Commonly known as:  PRILOSEC OTC     TAKE these medications   acetaminophen 650 MG CR tablet Commonly known as:  TYLENOL Take 650 mg by mouth every 8 (eight) hours as needed for pain.   amLODipine 5 MG tablet Commonly known as:  NORVASC Take 1 tablet (5 mg total) by mouth daily.   amoxicillin-clavulanate 875-125 MG tablet Commonly known as:  AUGMENTIN Take 1 tablet by mouth 2 (two) times daily for 7 days.   aspirin 81 MG EC tablet Take 1 tablet (81 mg total) by mouth daily.   esomeprazole 40 MG capsule Commonly known as:  NEXIUM Take 1 capsule (40 mg total) by mouth daily.   metoprolol tartrate 25 MG tablet Commonly known as:  LOPRESSOR Take 1 tablet (25 mg total) by mouth 2 (two) times daily.      Follow-up Information    Len Blalock, NP On 02/22/2018.   Specialty:  Internal Medicine Why:  at 9:00 am Contact information: [address] [place] [address] [phone]         Laqueta Linden, MD On 02/08/2018.   Specialty:  Cardiology Why:  at 8:40 am [facility] office) Contact information: [address] [place] [address] [phone]          No Known Allergies  Consultations:     Procedures/Studies: Dg Chest 2 View  Result Date: 02/02/2018 CLINICAL DATA:  Decreased oxygen saturation. The patient awakened with nausea, vomiting, difficulty swallowing, and weakness. History of aspiration pneumonia. EXAM: CHEST - 2 VIEW COMPARISON:  Chest x-ray of Jan 05, 2018 FINDINGS: The lungs are well-expanded. The interstitial markings are mildly increased. There is no alveolar infiltrate or pleural effusion. The heart and pulmonary vascularity are normal. The mediastinum is normal in width. The bony thorax is unremarkable. IMPRESSION: Mildly increased lung markings bilaterally may reflect early interstitial pneumonia or atypical pulmonary edema. No definite evidence of aspiration. Follow-up radiographs following anticipated antibiotic therapy are recommended to assure clearing. Electronically Signed   By: David  [place] M.D.   On: 02/02/2018 14:40   Dg Chest 2 View  Result Date: 01/05/2018 CLINICAL DATA:  Aspiration pneumonia. EXAM: CHEST - 2 VIEW COMPARISON:  Radiograph of Jan 03, 2018 FINDINGS: The heart size and mediastinal contours are within normal limits. Endotracheal and nasogastric tubes have been removed. No pneumothorax or pleural effusion is noted. Both lungs are clear. The visualized skeletal structures are unremarkable. IMPRESSION: No active cardiopulmonary disease. Electronically Signed   By: Lupita Raider, M.D.   On: 01/05/2018 09:43   Ct Head Wo Contrast  Result Date: 02/02/2018 CLINICAL DATA:  Recently discharged from hospital. Nausea, vomiting, difficulty swallowing and weakness. EXAM: CT HEAD WITHOUT CONTRAST TECHNIQUE: Contiguous axial images were obtained from the base of the skull through the vertex without intravenous contrast. COMPARISON:   Head CT dated 01/01/2018. FINDINGS: Brain: Ventricles are stable in size and configuration. All areas of the brain demonstrate appropriate gray-white matter differentiation. There is no mass, hemorrhage, edema or other evidence of acute parenchymal abnormality. No extra-axial hemorrhage. Vascular: There are chronic calcified atherosclerotic changes of the large vessels at the skull base. No unexpected hyperdense vessel. Skull: Normal. Negative for fracture or focal lesion. Sinuses/Orbits: No acute finding. Other: None. IMPRESSION: Negative head CT.  No intracranial mass, hemorrhage or edema. Electronically Signed   By: Bary Richard M.D.   On: 02/02/2018 16:28   Dg Esophagus  Result Date: 01/06/2018 CLINICAL DATA:  Dysphagia. EXAM: ESOPHOGRAM/BARIUM SWALLOW TECHNIQUE: Single contrast examination was performed using  thin barium. FLUOROSCOPY TIME:  Fluoroscopy Time:  1 minutes 52 seconds Radiation Exposure Index (if provided by the fluoroscopic device): Number of Acquired Spot Images: 0 COMPARISON:  None. FINDINGS: Mild decrease in esophageal peristalsis. Esophageal lumen normal. No aspiration. Narrowing of the distal esophagus at the GE junction. This is not obstructive but did occasionally delayed passage of barium. Possible spasm or benign stricture. Margins are smooth. No mass or ulceration. Negative for hiatal hernia or reflux Barium tablet did not pass through the distal esophagus into the stomach due to the distal stricture  IMPRESSION: Smooth mild stricture distal esophagus which did not allow passage of the barium tablet. This appears to be a benign stricture and there may be an element of spasm present as well. Decreased esophageal peristalsis. Negative for hiatal hernia or reflux. Electronically Signed   By: Marlan Palau M.D.   On: 01/06/2018 13:46   Dg Chest Port 1 View  Result Date: 02/03/2018 CLINICAL DATA:  Recent pneumonia EXAM: PORTABLE CHEST 1 VIEW COMPARISON:  Via yesterday FINDINGS:  Normal heart size and mediastinal contours. No acute infiltrate or edema. Artifact from EKG leads. No effusion or pneumothorax. No acute osseous findings. IMPRESSION: Negative chest. Electronically Signed   By: Marnee Spring M.D.   On: 02/03/2018 07:33       Subjective:   Discharge Exam: Vitals:   02/03/18 0638 02/03/18 1453  BP: 121/70 123/86  Pulse: 86 91  Resp: 20 18  Temp: 97.7 F (36.5 C)   SpO2: 94% 91%   Vitals:   02/02/18 2112 02/02/18 2224 02/03/18 0638 02/03/18 1453  BP:  129/81 121/70 123/86  Pulse:  (!) 103 86 91  Resp:  20 20 18   Temp:  99.1 F (37.3 C) 97.7 F (36.5 C)   TempSrc:  Oral Oral   SpO2: (!) 85% 96% 94% 91%  Weight:   81 kg (178 lb 8 oz)   Height:        General: Pt is alert, awake, not in acute distress Cardiovascular: RRR, S1/S2 +, no rubs, no gallops Respiratory: CTA bilaterally, no wheezing, no rhonchi Abdominal: Soft, NT, ND, bowel sounds + Extremities: no edema, no cyanosis    The results of significant diagnostics from this hospitalization (including imaging, microbiology, ancillary and laboratory) are listed below for reference.     Microbiology: Recent Results (from the past 240 hour(s))  Culture, blood (Routine X 2) w Reflex to ID Panel     Status: None (Preliminary result)   Collection Time: 02/02/18  4:03 PM  Result Value Ref Range Status   Specimen Description BLOOD LEFT ANTECUBITAL  Final   Special Requests   Final    BOTTLES DRAWN AEROBIC AND ANAEROBIC Blood Culture adequate volume   Culture   Final    NO GROWTH < 24 HOURS Performed at [facility], [address] [place], [place] [address]    Report Status PENDING  Incomplete     Labs: BNP (last 3 results) Recent Labs    01/02/18 0209 02/02/18 1355 02/03/18 0507  BNP 310.8* 238.0* 92.0   Basic Metabolic Panel: Recent Labs  Lab 02/02/18 1355 02/03/18 0507  NA 136 141  K 4.2 3.7  CL 96* 102  CO2 30 30  GLUCOSE 127* 131*  BUN 10 14  CREATININE 0.73  0.69  CALCIUM 8.8* 8.4*  MG  --  2.1   Liver Function Tests: Recent Labs  Lab 02/02/18 1407 02/03/18 0507  AST 97* 78*  ALT 412* 282*  ALKPHOS 78 62  BILITOT 0.7 0.7  PROT 7.9 7.1  ALBUMIN 4.1 3.5   No results for input(s): LIPASE, AMYLASE in the last 168 hours. No results for input(s): AMMONIA in the last 168 hours. CBC: Recent Labs  Lab 02/02/18 1355 02/03/18 0507  WBC 7.9 4.8  NEUTROABS 6.4 2.7  HGB 14.3 13.4  HCT 43.8 41.7  MCV 96.5 96.1  PLT 153 124*   Cardiac Enzymes: Recent Labs  Lab 02/02/18 1355 02/02/18 1800 02/02/18 2314 02/03/18 0507  TROPONINI 0.05* 0.05* 0.04* 0.04*   BNP:  Invalid input(s): POCBNP CBG: No results for input(s): GLUCAP in the last 168 hours. D-Dimer Recent Labs    02/02/18 1407  DDIMER 0.33   Hgb A1c No results for input(s): HGBA1C in the last 72 hours. Lipid Profile No results for input(s): CHOL, HDL, LDLCALC, TRIG, CHOLHDL, LDLDIRECT in the last 72 hours. Thyroid function studies Recent Labs    02/02/18 1800  TSH 1.256   Anemia work up No results for input(s): VITAMINB12, FOLATE, FERRITIN, TIBC, IRON, RETICCTPCT in the last 72 hours. Urinalysis    Component Value Date/Time   COLORURINE YELLOW 02/03/2018 1021   APPEARANCEUR HAZY (A) 02/03/2018 1021   LABSPEC 1.023 02/03/2018 1021   PHURINE 5.0 02/03/2018 1021   GLUCOSEU NEGATIVE 02/03/2018 1021   HGBUR NEGATIVE 02/03/2018 1021   BILIRUBINUR NEGATIVE 02/03/2018 1021   KETONESUR NEGATIVE 02/03/2018 1021   PROTEINUR NEGATIVE 02/03/2018 1021   NITRITE NEGATIVE 02/03/2018 1021   LEUKOCYTESUR NEGATIVE 02/03/2018 1021   Sepsis Labs Invalid input(s): PROCALCITONIN,  WBC,  LACTICIDVEN Microbiology Recent Results (from the past 240 hour(s))  Culture, blood (Routine X 2) w Reflex to ID Panel     Status: None (Preliminary result)   Collection Time: 02/02/18  4:03 PM  Result Value Ref Range Status   Specimen Description BLOOD LEFT ANTECUBITAL  Final   Special  Requests   Final    BOTTLES DRAWN AEROBIC AND ANAEROBIC Blood Culture adequate volume   Culture   Final    NO GROWTH < 24 HOURS Performed at [facility], [address] [place], [place] [address]    Report Status PENDING  Incomplete     Time coordinating discharge: 35mins  SIGNED:   Erick BlinksJehanzeb Memon, MD  [facility] 02/03/2018, 7:18 PM Pager   If 7PM-7AM, please contact night-coverage [email or web address] Password [email or web address]

## 2018-02-04 LAB — HIV ANTIBODY (ROUTINE TESTING W REFLEX): HIV SCREEN 4TH GENERATION: NONREACTIVE

## 2018-02-07 LAB — CULTURE, BLOOD (ROUTINE X 2)
Culture: NO GROWTH
SPECIAL REQUESTS: ADEQUATE

## 2018-02-08 ENCOUNTER — Ambulatory Visit (INDEPENDENT_AMBULATORY_CARE_PROVIDER_SITE_OTHER): Payer: Self-pay | Admitting: Cardiovascular Disease

## 2018-02-08 ENCOUNTER — Encounter: Payer: Self-pay | Admitting: Cardiovascular Disease

## 2018-02-08 ENCOUNTER — Encounter: Payer: Self-pay | Admitting: *Deleted

## 2018-02-08 VITALS — BP 118/68 | HR 86 | Ht 72.0 in | Wt 183.0 lb

## 2018-02-08 DIAGNOSIS — Z9289 Personal history of other medical treatment: Secondary | ICD-10-CM

## 2018-02-08 DIAGNOSIS — I214 Non-ST elevation (NSTEMI) myocardial infarction: Secondary | ICD-10-CM

## 2018-02-08 DIAGNOSIS — J690 Pneumonitis due to inhalation of food and vomit: Secondary | ICD-10-CM

## 2018-02-08 DIAGNOSIS — I10 Essential (primary) hypertension: Secondary | ICD-10-CM

## 2018-02-08 NOTE — Addendum Note (Signed)
Addended by: Lesle ChrisHILL, Khameron Gruenwald G on: 02/08/2018 09:36 AM   Modules accepted: Orders

## 2018-02-08 NOTE — Patient Instructions (Signed)
Medication Instructions:  Continue all current medications.  Labwork: none  Testing/Procedures: Your physician has requested that you have an exercise tolerance test. For further information please visit [email or web address]. Please also follow instruction sheet, as given.  Office will contact with results via phone or letter.     Follow-Up:  Pending test results   Any Other Special Instructions Will Be Listed Below (If Applicable).   If you need a refill on your cardiac medications before your next appointment, please call your pharmacy.  

## 2018-02-08 NOTE — Progress Notes (Signed)
SUBJECTIVE: The patient is a 57 year old male with a history of hypertension, hepatitis C, and polysubstance abuse who was seen by Dr. Graciela HusbandsKlein while hospitalized in May 2019.  At that time he was found unresponsive at home and was intubated and urine drug screen was positive for benzodiazepines and narcotics.  Troponins were noted to be elevated up to 3.25.  It was felt he had a type II non-STEMI.  He had aspiration pneumonia and acute hypoxic respiratory failure with elevated liver transaminases at that time. There is a family history of premature coronary artery disease.  He was bradycardic but had multiple electrolyte abnormalities.  He was discharged on 5/23 and readmitted on 6/19.  It was felt he had persistent pneumonia and was continued on IV antibiotics.  Echocardiogram 01/03/2018: Normal left ventricular systolic and diastolic function and normal regional wall motion, LVEF 55 to 60%.  There was moderate left ventricular dilatation noted.  I personally reviewed the most recent ECG performed on 02/02/2018 which demonstrated normal sinus rhythm and no ischemic ST segment or T wave abnormalities, nor any arrhythmias.  The patient denies any symptoms of chest pain, palpitations, shortness of breath, lightheadedness, dizziness, leg swelling, orthopnea, PND, and syncope.   He is here with his girlfriend.  Family history: Father had three-vessel CABG in his late 4960s and also has a pacemaker.  Mother had an MI in her 8660s and also had a stroke.    Review of Systems: As per "subjective", otherwise negative.  No Known Allergies  Current Outpatient Medications  Medication Sig Dispense Refill  . acetaminophen (TYLENOL) 650 MG CR tablet Take 650 mg by mouth every 8 (eight) hours as needed for pain.    Marland Kitchen. amLODipine (NORVASC) 5 MG tablet Take 1 tablet (5 mg total) by mouth daily. 30 tablet 0  . amoxicillin-clavulanate (AUGMENTIN) 875-125 MG tablet Take 1 tablet by mouth 2 (two) times daily  for 7 days. 14 tablet 0  . esomeprazole (NEXIUM) 40 MG capsule Take 1 capsule (40 mg total) by mouth daily. 30 capsule 1  . metoprolol tartrate (LOPRESSOR) 25 MG tablet Take 1 tablet (25 mg total) by mouth 2 (two) times daily. 60 tablet 0   No current facility-administered medications for this visit.     Past Medical History:  Diagnosis Date  . Chronic pain   . Hepatitis   . Opiate abuse, continuous ([facility])     Past Surgical History:  Procedure Laterality Date  . COLONOSCOPY N/A 11/22/2013   Procedure: COLONOSCOPY;  Surgeon: Malissa HippoNajeeb U Rehman, MD;  Location: [facility];  Service: Endoscopy;  Laterality: N/A;  1030  . Fracture Right Leg     Patient has rod/screws in this leg  . Rotor Cuff  2012   Right Shoulder    Social History   Socioeconomic History  . Marital status: Divorced    Spouse name: Not on file  . Number of children: Not on file  . Years of education: Not on file  . Highest education level: Not on file  Occupational History  . Not on file  Social Needs  . Financial resource strain: Not on file  . Food insecurity:    Worry: Not on file    Inability: Not on file  . Transportation needs:    Medical: Not on file    Non-medical: Not on file  Tobacco Use  . Smoking status: Never Smoker  . Smokeless tobacco: Never Used  Substance and Sexual Activity  .  Alcohol use: No    Comment: Patient states that it has been over a year  . Drug use: Yes    Comment: opiates  . Sexual activity: Yes  Lifestyle  . Physical activity:    Days per week: Not on file    Minutes per session: Not on file  . Stress: Not on file  Relationships  . Social connections:    Talks on phone: Not on file    Gets together: Not on file    Attends religious service: Not on file    Active member of club or organization: Not on file    Attends meetings of clubs or organizations: Not on file    Relationship status: Not on file  . Intimate partner violence:    Fear of current or ex partner:  Not on file    Emotionally abused: Not on file    Physically abused: Not on file    Forced sexual activity: Not on file  Other Topics Concern  . Not on file  Social History Narrative  . Not on file     Vitals:   02/08/18 0858  BP: 118/68  Pulse: 86  SpO2: 92%  Weight: 183 lb (83 kg)  Height: 6' (1.829 m)    Wt Readings from Last 3 Encounters:  02/08/18 183 lb (83 kg)  02/03/18 178 lb 8 oz (81 kg)  01/06/18 177 lb 3.2 oz (80.4 kg)     PHYSICAL EXAM General: NAD HEENT: Normal. Neck: No JVD, no thyromegaly. Lungs: Clear to auscultation bilaterally with normal respiratory effort. CV: Regular rate and rhythm, normal S1/S2, no S3/S4, no murmur. No pretibial or periankle edema.  No carotid bruit.   Abdomen: Soft, nontender, no distention.  Neurologic: Alert and oriented.  Psych: Normal affect. Skin: Normal. Musculoskeletal: No gross deformities.    ECG: Most recent ECG reviewed.   Labs: Lab Results  Component Value Date/Time   K 3.7 02/03/2018 05:07 AM   BUN 14 02/03/2018 05:07 AM   CREATININE 0.69 02/03/2018 05:07 AM   ALT 282 (H) 02/03/2018 05:07 AM   TSH 1.256 02/02/2018 06:00 PM   HGB 13.4 02/03/2018 05:07 AM     Lipids: Lab Results  Component Value Date/Time   TRIG 95 01/02/2018 02:13 AM       ASSESSMENT AND PLAN:  1.  Non-STEMI: This appears to have been indicative of demand ischemia of the myocardium in the context of aspiration pneumonia and multiple electrolyte abnormalities.  He does have a family history of heart disease.  Cardiac function was normal by echocardiogram as noted above.  I will obtain an exercise treadmill stress test.  2.  Hypertension: Blood pressure is normal.  No changes to therapy.  3.  Aspiration pneumonia: Currently on Augmentin.   Disposition: Follow up to be determined  Time spent: 40 minutes, of which greater than 50% was spent reviewing symptoms, relevant blood tests and studies, and discussing management plan with  the patient.    Prentice Docker, M.D., F.A.C.C.

## 2018-02-16 ENCOUNTER — Ambulatory Visit (HOSPITAL_COMMUNITY)
Admission: RE | Admit: 2018-02-16 | Discharge: 2018-02-16 | Disposition: A | Discharge status: Home / Self Care | Payer: Self-pay | Source: Ambulatory Visit | Attending: Cardiovascular Disease | Admitting: Cardiovascular Disease

## 2018-02-16 DIAGNOSIS — I214 Non-ST elevation (NSTEMI) myocardial infarction: Secondary | ICD-10-CM | POA: Insufficient documentation

## 2018-02-16 LAB — EXERCISE TOLERANCE TEST
CSEPHR: 85 %
Estimated workload: 10.3 METS
Exercise duration (min): 11 min
Exercise duration (sec): 4 s
MPHR: 164 {beats}/min
Peak HR: 141 {beats}/min
RPE: 15
Rest HR: 43 {beats}/min

## 2018-02-18 ENCOUNTER — Telehealth: Payer: Self-pay | Admitting: *Deleted

## 2018-02-18 NOTE — Telephone Encounter (Signed)
Called patient with test results. No answer. Left message to call back.  

## 2018-02-18 NOTE — Telephone Encounter (Signed)
-----   Message from Laqueta LindenSuresh A Koneswaran, MD sent at 02/16/2018 12:41 PM EDT ----- Low risk for cardiac events.

## 2018-02-22 ENCOUNTER — Ambulatory Visit (INDEPENDENT_AMBULATORY_CARE_PROVIDER_SITE_OTHER): Payer: Self-pay | Admitting: Internal Medicine

## 2018-02-22 ENCOUNTER — Encounter (INDEPENDENT_AMBULATORY_CARE_PROVIDER_SITE_OTHER): Payer: Self-pay | Admitting: Internal Medicine

## 2018-02-22 VITALS — BP 120/70 | HR 60 | Temp 97.4°F | Ht 72.0 in | Wt 185.9 lb

## 2018-02-22 DIAGNOSIS — K222 Esophageal obstruction: Secondary | ICD-10-CM

## 2018-02-22 DIAGNOSIS — R748 Abnormal levels of other serum enzymes: Secondary | ICD-10-CM

## 2018-02-22 DIAGNOSIS — B182 Chronic viral hepatitis C: Secondary | ICD-10-CM

## 2018-02-22 NOTE — Patient Instructions (Signed)
The risks of bleeding, perforation and infection were reviewed with patient.  

## 2018-02-22 NOTE — Progress Notes (Addendum)
Subjective:    Patient ID: Christian Ellison, male    DOB: 24-Jul-1961, 57 y.o.   MRN: [id number]  HPI Here for f/u after [facility]. Hx of esophageal stricture polysubstance abuse. Hx of positive Hep C antibody with negative viral load in 2014. Marland Kitchen  Also had admission in May at [facility] for aspiration pneumonia and respiratory failure and elevated troponin's, ?  overdose, elevated liver enzymes. Marland KitchenHe was intubated during admission.  Underwent a DG Esophagram 01/06/2018 which revealed IMPRESSION: Smooth mild stricture distal esophagus which did not allow passage  of the barium tablet. This appears to be a benign stricture and there may be an element of spasm present as well. Decreased esophageal peristalsis. Negative for hiatal hernia or reflux. He says he is not having any dysphagia. He says he is eating better. He says he can eat what he wants.  Not eating sweats. Has no difficulty eating steaks. Denies etoh abuse.  No hx of IV drug. Does not smoke.  03/07/2013 Liver biopsy: Grade 2 inflammation and stage 0 fibrosis.    Hepatic Function Latest Ref Rng & Units 02/03/2018 02/02/2018 01/06/2018  Total Protein 6.5 - 8.1 g/dL 7.1 7.9 6.5  Albumin 3.5 - 5.0 g/dL 3.5 4.1 3.0(L)  AST 15 - 41 U/L 78(H) 97(H) 72(H)  ALT 17 - 63 U/L 282(H) 412(H) 781(H)  Alk Phosphatase 38 - 126 U/L 62 78 46  Total Bilirubin 0.3 - 1.2 mg/dL 0.7 0.7 1.4(H)  Bilirubin, Direct 0.1 - 0.5 mg/dL - 0.1 0.3   CBC    Component Value Date/Time   WBC 4.8 02/03/2018 0507   RBC 4.34 02/03/2018 0507   HGB 13.4 02/03/2018 0507   HCT 41.7 02/03/2018 0507   PLT 124 (L) 02/03/2018 0507   MCV 96.1 02/03/2018 0507   MCH 30.9 02/03/2018 0507   MCHC 32.1 02/03/2018 0507   RDW 13.5 02/03/2018 0507   LYMPHSABS 1.3 02/03/2018 0507   MONOABS 0.6 02/03/2018 0507   EOSABS 0.2 02/03/2018 0507   BASOSABS 0.0 02/03/2018 0507     Review of Systems   Past Medical History:  Diagnosis Date  . Chronic pain   .  Hepatitis   . Opiate abuse, continuous ([place])     Past Surgical History:  Procedure Laterality Date  . COLONOSCOPY N/A 11/22/2013   Procedure: COLONOSCOPY;  Surgeon: Rogene Houston, MD;  Location: [facility];  Service: Endoscopy;  Laterality: N/A;  1030  . Fracture Right Leg     Patient has rod/screws in this leg  . Rotor Cuff  2012   Right Shoulder    No Known Allergies  Current Outpatient Medications on File Prior to Visit  Medication Sig Dispense Refill  . aspirin 81 MG chewable tablet Chew by mouth daily.    Marland Kitchen esomeprazole (NEXIUM) 40 MG capsule Take 1 capsule (40 mg total) by mouth daily. 30 capsule 1  . metoprolol tartrate (LOPRESSOR) 25 MG tablet Take 1 tablet (25 mg total) by mouth 2 (two) times daily. 60 tablet 0  . omeprazole (PRILOSEC) 40 MG capsule Take 40 mg by mouth daily.     No current facility-administered medications on file prior to visit.         Objective:   Physical Exam Blood pressure 120/70, pulse 60, temperature (!) 97.4 F (36.3 C), height 6' (1.829 m), weight 185 lb 14.4 oz (84.3 kg). Alert and oriented. Skin warm and dry. Oral mucosa is moist.   .  Sclera anicteric, conjunctivae is pink. Thyroid not enlarged. No cervical lymphadenopathy. Lungs clear. Heart regular rate and rhythm.  Abdomen is soft. Bowel sounds are positive. No hepatomegaly. No abdominal masses felt. No tenderness.  No edema to lower extremities.           Assessment & Plan:  Esophageal stricture: EGD/ED with propofol. +Hepatitis C antibody. Will get Hepatitis C quaint. Elevated liver enzymes: Hepatic function today.

## 2018-02-24 LAB — HEPATIC FUNCTION PANEL
AG RATIO: 1.4 (calc) (ref 1.0–2.5)
ALKALINE PHOSPHATASE (APISO): 60 U/L (ref 40–115)
ALT: 14 U/L (ref 9–46)
AST: 20 U/L (ref 10–35)
Albumin: 4.2 g/dL (ref 3.6–5.1)
BILIRUBIN INDIRECT: 0.4 mg/dL (ref 0.2–1.2)
Bilirubin, Direct: 0.1 mg/dL (ref 0.0–0.2)
Globulin: 2.9 g/dL (calc) (ref 1.9–3.7)
TOTAL PROTEIN: 7.1 g/dL (ref 6.1–8.1)
Total Bilirubin: 0.5 mg/dL (ref 0.2–1.2)

## 2018-02-24 LAB — HEPATITIS C RNA QUANTITATIVE
HCV QUANT LOG: 2.8 {Log_IU}/mL — AB
HCV RNA, PCR, QN: 637 [IU]/mL — AB

## 2018-03-01 ENCOUNTER — Encounter (INDEPENDENT_AMBULATORY_CARE_PROVIDER_SITE_OTHER): Payer: Self-pay | Admitting: *Deleted

## 2018-03-01 ENCOUNTER — Other Ambulatory Visit (INDEPENDENT_AMBULATORY_CARE_PROVIDER_SITE_OTHER): Payer: Self-pay | Admitting: Internal Medicine

## 2018-03-01 DIAGNOSIS — K222 Esophageal obstruction: Secondary | ICD-10-CM

## 2018-03-08 NOTE — Patient Instructions (Signed)
Christian MealsJohn G Sibilia  03/08/2018     @PREFPERIOPPHARMACY @   Your procedure is scheduled on  03/18/2018.  Report to [facility] at  700   A.M.  Call this number if you have problems the morning of surgery:  [phone]   Remember:  Do not eat or drink after midnight.  You may drink clear liquids until ( follow the diet and prep instructions given to you by Dr Patty Sermonsehman's office) .  Clear liquids allowed are:                    Water, Juice (non-citric and without pulp), Carbonated beverages, Clear Tea, Black Coffee only, Plain Jell-O only, Gatorade and Plain Popsicles only    Take these medicines the morning of surgery with A SIP OF WATER  Atenolol, prilosec.    Do not wear jewelry, make-up or nail polish.  Do not wear lotions, powders, or perfumes, or deodorant.  Do not shave 48 hours prior to surgery.  Men may shave face and neck.  Do not bring valuables to the hospital.  [facility] is not responsible for any belongings or valuables.  Contacts, dentures or bridgework may not be worn into surgery.  Leave your suitcase in the car.  After surgery it may be brought to your room.  For patients admitted to the hospital, discharge time will be determined by your treatment team.  Patients discharged the day of surgery will not be allowed to drive home.   Name and phone number of your driver:   family Special instructions:  Follow the diet and prep instructions given to you by Dr Patty Sermonsehman's office.  Please read over the following fact sheets that you were given. Anesthesia Post-op Instructions and Care and Recovery After Surgery       Esophagogastroduodenoscopy Esophagogastroduodenoscopy (EGD) is a procedure to examine the lining of the esophagus, stomach, and first part of the small intestine (duodenum). This procedure is done to check for problems such as inflammation, bleeding, ulcers, or growths. During this procedure, a long, flexible, lighted tube with a camera  attached (endoscope) is inserted down the throat. Tell a health care provider about:  Any allergies you have.  All medicines you are taking, including vitamins, herbs, eye drops, creams, and over-the-counter medicines.  Any problems you or family members have had with anesthetic medicines.  Any blood disorders you have.  Any surgeries you have had.  Any medical conditions you have.  Whether you are pregnant or may be pregnant. What are the risks? Generally, this is a safe procedure. However, problems may occur, including:  Infection.  Bleeding.  A tear (perforation) in the esophagus, stomach, or duodenum.  Trouble breathing.  Excessive sweating.  Spasms of the larynx.  A slowed heartbeat.  Low blood pressure.  What happens before the procedure?  Follow instructions from your health care provider about eating or drinking restrictions.  Ask your health care provider about: ? Changing or stopping your regular medicines. This is especially important if you are taking diabetes medicines or blood thinners. ? Taking medicines such as aspirin and ibuprofen. These medicines can thin your blood. Do not take these medicines before your procedure if your health care provider instructs you not to.  Plan to have someone take you home after the procedure.  If you wear dentures, be ready to remove them before the procedure. What happens during the procedure?  To reduce your  risk of infection, your health care team will wash or sanitize their hands.  An IV tube will be put in a vein in your hand or arm. You will get medicines and fluids through this tube.  You will be given one or more of the following: ? A medicine to help you relax (sedative). ? A medicine to numb the area (local anesthetic). This medicine may be sprayed into your throat. It will make you feel more comfortable and keep you from gagging or coughing during the procedure. ? A medicine for pain.  A mouth guard  may be placed in your mouth to protect your teeth and to keep you from biting on the endoscope.  You will be asked to lie on your left side.  The endoscope will be lowered down your throat into your esophagus, stomach, and duodenum.  Air will be put into the endoscope. This will help your health care provider see better.  The lining of your esophagus, stomach, and duodenum will be examined.  Your health care provider may: ? Take a tissue sample so it can be looked at in a lab (biopsy). ? Remove growths. ? Remove objects (foreign bodies) that are stuck. ? Treat any bleeding with medicines or other devices that stop tissue from bleeding. ? Widen (dilate) or stretch narrowed areas of your esophagus and stomach.  The endoscope will be taken out. The procedure may vary among health care providers and hospitals. What happens after the procedure?  Your blood pressure, heart rate, breathing rate, and blood oxygen level will be monitored often until the medicines you were given have worn off.  Do not eat or drink anything until the numbing medicine has worn off and your gag reflex has returned. This information is not intended to replace advice given to you by your health care provider. Make sure you discuss any questions you have with your health care provider. Document Released: 12/04/2004 Document Revised: 01/09/2016 Document Reviewed: 06/27/2015 Elsevier Interactive Patient Education  2018 [employer]. Esophagogastroduodenoscopy, Care After Refer to this sheet in the next few weeks. These instructions provide you with information about caring for yourself after your procedure. Your health care provider may also give you more specific instructions. Your treatment has been planned according to current medical practices, but problems sometimes occur. Call your health care provider if you have any problems or questions after your procedure. What can I expect after the procedure? After the  procedure, it is common to have:  A sore throat.  Nausea.  Bloating.  Dizziness.  Fatigue.  Follow these instructions at home:  Do not eat or drink anything until the numbing medicine (local anesthetic) has worn off and your gag reflex has returned. You will know that the local anesthetic has worn off when you can swallow comfortably.  Do not drive for 24 hours if you received a medicine to help you relax (sedative).  If your health care provider took a tissue sample for testing during the procedure, make sure to get your test results. This is your responsibility. Ask your health care provider or the department performing the test when your results will be ready.  Keep all follow-up visits as told by your health care provider. This is important. Contact a health care provider if:  You cannot stop coughing.  You are not urinating.  You are urinating less than usual. Get help right away if:  You have trouble swallowing.  You cannot eat or drink.  You have throat or  chest pain that gets worse.  You are dizzy or light-headed.  You faint.  You have nausea or vomiting.  You have chills.  You have a fever.  You have severe abdominal pain.  You have black, tarry, or bloody stools. This information is not intended to replace advice given to you by your health care provider. Make sure you discuss any questions you have with your health care provider. Document Released: 07/20/2012 Document Revised: 01/09/2016 Document Reviewed: 06/27/2015 Elsevier Interactive Patient Education  2018 [employer].  Esophageal Dilatation Esophageal dilatation is a procedure to open a blocked or narrowed part of the esophagus. The esophagus is the long tube in your throat that carries food and liquid from your mouth to your stomach. The procedure is also called esophageal dilation. You may need this procedure if you have a buildup of scar tissue in your esophagus that makes it difficult,  painful, or even impossible to swallow. This can be caused by gastroesophageal reflux disease (GERD). In rare cases, people need this procedure because they have cancer of the esophagus or a problem with the way food moves through the esophagus. Sometimes you may need to have another dilatation to enlarge the opening of the esophagus gradually. Tell a health care provider about:  Any allergies you have.  All medicines you are taking, including vitamins, herbs, eye drops, creams, and over-the-counter medicines.  Any problems you or family members have had with anesthetic medicines.  Any blood disorders you have.  Any surgeries you have had.  Any medical conditions you have.  Any antibiotic medicines you are required to take before dental procedures. What are the risks? Generally, this is a safe procedure. However, problems can occur and include:  Bleeding from a tear in the lining of the esophagus.  A hole (perforation) in the esophagus.  What happens before the procedure?  Do not eat or drink anything after midnight on the night before the procedure or as directed by your health care provider.  Ask your health care provider about changing or stopping your regular medicines. This is especially important if you are taking diabetes medicines or blood thinners.  Plan to have someone take you home after the procedure. What happens during the procedure?  You will be given a medicine that makes you relaxed and sleepy (sedative).  A medicine may be sprayed or gargled to numb the back of the throat.  Your health care provider can use various instruments to do an esophageal dilatation. During the procedure, the instrument used will be placed in your mouth and passed down into your esophagus. Options include: ? Simple dilators. This instrument is carefully placed in the esophagus to stretch it. ? Guided wire bougies. In this method, a flexible tube (endoscope) is used to insert a wire into  the esophagus. The dilator is passed over this wire to enlarge the esophagus. Then the wire is removed. ? Balloon dilators. An endoscope with a small balloon at the end is passed down into the esophagus. Inflating the balloon gently stretches the esophagus and opens it up. What happens after the procedure?  Your blood pressure, heart rate, breathing rate, and blood oxygen level will be monitored often until the medicines you were given have worn off.  Your throat may feel slightly sore and will probably still feel numb. This will improve slowly over time.  You will not be allowed to eat or drink until the throat numbness has resolved.  If this is a same-day procedure, you  may be allowed to go home once you have been able to drink, urinate, and sit on the edge of the bed without nausea or dizziness.  If this is a same-day procedure, you should have a friend or family member with you for the next 24 hours after the procedure. This information is not intended to replace advice given to you by your health care provider. Make sure you discuss any questions you have with your health care provider. Document Released: 09/24/2005 Document Revised: 01/09/2016 Document Reviewed: 12/13/2013 Elsevier Interactive Patient Education  2018 [place] Anesthesia is a term that refers to techniques, procedures, and medicines that help a person stay safe and comfortable during a medical procedure. Monitored anesthesia care, or sedation, is one type of anesthesia. Your anesthesia specialist may recommend sedation if you will be having a procedure that does not require you to be unconscious, such as:  Cataract surgery.  A dental procedure.  A biopsy.  A colonoscopy.  During the procedure, you may receive a medicine to help you relax (sedative). There are three levels of sedation:  Mild sedation. At this level, you may feel awake and relaxed. You will be able to follow  directions.  Moderate sedation. At this level, you will be sleepy. You may not remember the procedure.  Deep sedation. At this level, you will be asleep. You will not remember the procedure.  The more medicine you are given, the deeper your level of sedation will be. Depending on how you respond to the procedure, the anesthesia specialist may change your level of sedation or the type of anesthesia to fit your needs. An anesthesia specialist will monitor you closely during the procedure. Let your health care provider know about:  Any allergies you have.  All medicines you are taking, including vitamins, herbs, eye drops, creams, and over-the-counter medicines.  Any use of steroids (by mouth or as a cream).  Any problems you or family members have had with sedatives and anesthetic medicines.  Any blood disorders you have.  Any surgeries you have had.  Any medical conditions you have, such as sleep apnea.  Whether you are pregnant or may be pregnant.  Any use of cigarettes, alcohol, or street drugs. What are the risks? Generally, this is a safe procedure. However, problems may occur, including:  Getting too much medicine (oversedation).  Nausea.  Allergic reaction to medicines.  Trouble breathing. If this happens, a breathing tube may be used to help with breathing. It will be removed when you are awake and breathing on your own.  Heart trouble.  Lung trouble.  Before the procedure Staying hydrated Follow instructions from your health care provider about hydration, which may include:  Up to 2 hours before the procedure - you may continue to drink clear liquids, such as water, clear fruit juice, black coffee, and plain tea.  Eating and drinking restrictions Follow instructions from your health care provider about eating and drinking, which may include:  8 hours before the procedure - stop eating heavy [place] or foods such as meat, fried foods, or fatty foods.  6 hours  before the procedure - stop eating light [place] or foods, such as toast or cereal.  6 hours before the procedure - stop drinking milk or drinks that contain milk.  2 hours before the procedure - stop drinking clear liquids.  Medicines Ask your health care provider about:  Changing or stopping your regular medicines. This is especially important if you are  taking diabetes medicines or blood thinners.  Taking medicines such as aspirin and ibuprofen. These medicines can thin your blood. Do not take these medicines before your procedure if your health care provider instructs you not to.  Tests and exams  You will have a physical exam.  You may have blood tests done to show: ? How well your kidneys and liver are working. ? How well your blood can clot.  General instructions  Plan to have someone take you home from the hospital or clinic.  If you will be going home right after the procedure, plan to have someone with you for 24 hours.  What happens during the procedure?  Your blood pressure, heart rate, breathing, level of pain and overall condition will be monitored.  An IV tube will be inserted into one of your veins.  Your anesthesia specialist will give you medicines as needed to keep you comfortable during the procedure. This may mean changing the level of sedation.  The procedure will be performed. After the procedure  Your blood pressure, heart rate, breathing rate, and blood oxygen level will be monitored until the medicines you were given have worn off.  Do not drive for 24 hours if you received a sedative.  You may: ? Feel sleepy, clumsy, or nauseous. ? Feel forgetful about what happened after the procedure. ? Have a sore throat if you had a breathing tube during the procedure. ? Vomit. This information is not intended to replace advice given to you by your health care provider. Make sure you discuss any questions you have with your health care provider. Document  Released: 04/29/2005 Document Revised: 01/10/2016 Document Reviewed: 11/24/2015 Elsevier Interactive Patient Education  2018 [place], Care After These instructions provide you with information about caring for yourself after your procedure. Your health care provider may also give you more specific instructions. Your treatment has been planned according to current medical practices, but problems sometimes occur. Call your health care provider if you have any problems or questions after your procedure. What can I expect after the procedure? After your procedure, it is common to:  Feel sleepy for several hours.  Feel clumsy and have poor balance for several hours.  Feel forgetful about what happened after the procedure.  Have poor judgment for several hours.  Feel nauseous or vomit.  Have a sore throat if you had a breathing tube during the procedure.  Follow these instructions at home: For at least 24 hours after the procedure:   Do not: ? Participate in activities in which you could fall or become injured. ? Drive. ? Use heavy machinery. ? Drink alcohol. ? Take sleeping pills or medicines that cause drowsiness. ? Make important decisions or sign legal documents. ? Take care of children on your own.  Rest. Eating and drinking  Follow the diet that is recommended by your health care provider.  If you vomit, drink water, juice, or soup when you can drink without vomiting.  Make sure you have little or no nausea before eating solid foods. General instructions  Have a responsible adult stay with you until you are awake and alert.  Take over-the-counter and prescription medicines only as told by your health care provider.  If you smoke, do not smoke without supervision.  Keep all follow-up visits as told by your health care provider. This is important. Contact a health care provider if:  You keep feeling nauseous or you keep  vomiting.  You feel light-headed.  You develop a rash.  You have a fever. Get help right away if:  You have trouble breathing. This information is not intended to replace advice given to you by your health care provider. Make sure you discuss any questions you have with your health care provider. Document Released: 11/24/2015 Document Revised: 03/25/2016 Document Reviewed: 11/24/2015 Elsevier Interactive Patient Education  [employer].

## 2018-03-10 ENCOUNTER — Encounter (HOSPITAL_COMMUNITY)
Admission: RE | Admit: 2018-03-10 | Discharge: 2018-03-10 | Disposition: A | Discharge status: Home / Self Care | Payer: Self-pay | Source: Ambulatory Visit | Attending: Internal Medicine | Admitting: Internal Medicine

## 2018-03-10 ENCOUNTER — Other Ambulatory Visit: Payer: Self-pay

## 2018-03-10 ENCOUNTER — Encounter (HOSPITAL_COMMUNITY): Payer: Self-pay

## 2018-03-10 DIAGNOSIS — K222 Esophageal obstruction: Secondary | ICD-10-CM | POA: Insufficient documentation

## 2018-03-10 DIAGNOSIS — Z01818 Encounter for other preprocedural examination: Secondary | ICD-10-CM | POA: Insufficient documentation

## 2018-03-10 HISTORY — DX: Anxiety disorder, unspecified: F41.9

## 2018-03-10 HISTORY — DX: Gastro-esophageal reflux disease without esophagitis: K21.9

## 2018-03-10 LAB — CBC WITH DIFFERENTIAL/PLATELET
BASOS ABS: 0 10*3/uL (ref 0.0–0.1)
Basophils Relative: 0 %
EOS ABS: 0.3 10*3/uL (ref 0.0–0.7)
EOS PCT: 7 %
HCT: 38.5 % — ABNORMAL LOW (ref 39.0–52.0)
Hemoglobin: 12.8 g/dL — ABNORMAL LOW (ref 13.0–17.0)
LYMPHS PCT: 26 %
Lymphs Abs: 1.3 10*3/uL (ref 0.7–4.0)
MCH: 31 pg (ref 26.0–34.0)
MCHC: 33.2 g/dL (ref 30.0–36.0)
MCV: 93.2 fL (ref 78.0–100.0)
Monocytes Absolute: 0.4 10*3/uL (ref 0.1–1.0)
Monocytes Relative: 8 %
Neutro Abs: 2.9 10*3/uL (ref 1.7–7.7)
Neutrophils Relative %: 59 %
PLATELETS: 150 10*3/uL (ref 150–400)
RBC: 4.13 MIL/uL — AB (ref 4.22–5.81)
RDW: 12.3 % (ref 11.5–15.5)
WBC: 5 10*3/uL (ref 4.0–10.5)

## 2018-03-10 LAB — BASIC METABOLIC PANEL
ANION GAP: 9 (ref 5–15)
BUN: 14 mg/dL (ref 6–20)
CO2: 30 mmol/L (ref 22–32)
Calcium: 8.7 mg/dL — ABNORMAL LOW (ref 8.9–10.3)
Chloride: 103 mmol/L (ref 98–111)
Creatinine, Ser: 0.77 mg/dL (ref 0.61–1.24)
Glucose, Bld: 144 mg/dL — ABNORMAL HIGH (ref 70–99)
POTASSIUM: 3.5 mmol/L (ref 3.5–5.1)
SODIUM: 142 mmol/L (ref 135–145)

## 2018-03-11 ENCOUNTER — Telehealth (INDEPENDENT_AMBULATORY_CARE_PROVIDER_SITE_OTHER): Payer: Self-pay | Admitting: Internal Medicine

## 2018-03-11 NOTE — Telephone Encounter (Signed)
Could u send letter asking patient to call our office concerning his results. I am unable to reach him. Thanks

## 2018-03-18 ENCOUNTER — Ambulatory Visit (HOSPITAL_COMMUNITY)
Admission: RE | Admit: 2018-03-18 | Discharge: 2018-03-18 | Disposition: A | Discharge status: Home / Self Care | Payer: Self-pay | Source: Ambulatory Visit | Attending: Internal Medicine | Admitting: Internal Medicine

## 2018-03-18 ENCOUNTER — Ambulatory Visit (HOSPITAL_COMMUNITY): Payer: Self-pay | Admitting: Anesthesiology

## 2018-03-18 ENCOUNTER — Encounter (HOSPITAL_COMMUNITY)
Admission: RE | Disposition: A | Discharge status: Home / Self Care | Payer: Self-pay | Source: Ambulatory Visit | Attending: Internal Medicine

## 2018-03-18 ENCOUNTER — Encounter (HOSPITAL_COMMUNITY): Payer: Self-pay | Admitting: *Deleted

## 2018-03-18 DIAGNOSIS — B1920 Unspecified viral hepatitis C without hepatic coma: Secondary | ICD-10-CM | POA: Insufficient documentation

## 2018-03-18 DIAGNOSIS — G8929 Other chronic pain: Secondary | ICD-10-CM | POA: Insufficient documentation

## 2018-03-18 DIAGNOSIS — K2970 Gastritis, unspecified, without bleeding: Secondary | ICD-10-CM

## 2018-03-18 DIAGNOSIS — K449 Diaphragmatic hernia without obstruction or gangrene: Secondary | ICD-10-CM

## 2018-03-18 DIAGNOSIS — R1314 Dysphagia, pharyngoesophageal phase: Secondary | ICD-10-CM

## 2018-03-18 DIAGNOSIS — Z800 Family history of malignant neoplasm of digestive organs: Secondary | ICD-10-CM | POA: Insufficient documentation

## 2018-03-18 DIAGNOSIS — K222 Esophageal obstruction: Secondary | ICD-10-CM

## 2018-03-18 DIAGNOSIS — Z7982 Long term (current) use of aspirin: Secondary | ICD-10-CM | POA: Insufficient documentation

## 2018-03-18 DIAGNOSIS — F419 Anxiety disorder, unspecified: Secondary | ICD-10-CM | POA: Insufficient documentation

## 2018-03-18 DIAGNOSIS — Z79899 Other long term (current) drug therapy: Secondary | ICD-10-CM | POA: Insufficient documentation

## 2018-03-18 DIAGNOSIS — K3189 Other diseases of stomach and duodenum: Secondary | ICD-10-CM | POA: Insufficient documentation

## 2018-03-18 DIAGNOSIS — K2289 Other specified disease of esophagus: Secondary | ICD-10-CM

## 2018-03-18 DIAGNOSIS — F329 Major depressive disorder, single episode, unspecified: Secondary | ICD-10-CM | POA: Insufficient documentation

## 2018-03-18 DIAGNOSIS — I509 Heart failure, unspecified: Secondary | ICD-10-CM | POA: Insufficient documentation

## 2018-03-18 DIAGNOSIS — I110 Hypertensive heart disease with heart failure: Secondary | ICD-10-CM | POA: Insufficient documentation

## 2018-03-18 DIAGNOSIS — K219 Gastro-esophageal reflux disease without esophagitis: Secondary | ICD-10-CM | POA: Insufficient documentation

## 2018-03-18 HISTORY — PX: BIOPSY: SHX5522

## 2018-03-18 HISTORY — PX: ESOPHAGOGASTRODUODENOSCOPY (EGD) WITH PROPOFOL: SHX5813

## 2018-03-18 HISTORY — PX: ESOPHAGEAL DILATION: SHX303

## 2018-03-18 SURGERY — ESOPHAGOGASTRODUODENOSCOPY (EGD) WITH PROPOFOL
Anesthesia: Monitor Anesthesia Care

## 2018-03-18 MED ORDER — PROPOFOL 10 MG/ML IV BOLUS
INTRAVENOUS | Status: AC
  Filled 2018-03-18: qty 40

## 2018-03-18 MED ORDER — HYDROMORPHONE HCL 1 MG/ML IJ SOLN: 0.2500 mg | INTRAMUSCULAR | Status: DC | PRN

## 2018-03-18 MED ORDER — CHLORHEXIDINE GLUCONATE CLOTH 2 % EX PADS: 6.0000 | MEDICATED_PAD | Freq: Once | CUTANEOUS | Status: DC

## 2018-03-18 MED ORDER — HYDROCODONE-ACETAMINOPHEN 7.5-325 MG PO TABS: 1.0000 | ORAL_TABLET | Freq: Once | ORAL | Status: DC | PRN

## 2018-03-18 MED ORDER — MEPERIDINE HCL 100 MG/ML IJ SOLN: 6.2500 mg | INTRAMUSCULAR | Status: DC | PRN

## 2018-03-18 MED ORDER — PROPOFOL 500 MG/50ML IV EMUL
INTRAVENOUS | Status: DC | PRN
  Administered 2018-03-18: 150 ug/kg/min via INTRAVENOUS

## 2018-03-18 MED ORDER — ARTIFICIAL TEARS OPHTHALMIC OINT
TOPICAL_OINTMENT | OPHTHALMIC | Status: AC
  Filled 2018-03-18: qty 3.5

## 2018-03-18 MED ORDER — PROMETHAZINE HCL 25 MG/ML IJ SOLN: 6.2500 mg | INTRAMUSCULAR | Status: DC | PRN

## 2018-03-18 MED ORDER — PROPOFOL 10 MG/ML IV BOLUS
INTRAVENOUS | Status: DC | PRN
  Administered 2018-03-18: 40 mg via INTRAVENOUS
  Administered 2018-03-18: 20 mg via INTRAVENOUS

## 2018-03-18 MED ORDER — LACTATED RINGERS IV SOLN
INTRAVENOUS | Status: DC
  Administered 2018-03-18: 08:00:00 via INTRAVENOUS

## 2018-03-18 MED ORDER — LACTATED RINGERS IV SOLN: INTRAVENOUS | Status: DC

## 2018-03-18 MED ORDER — MIDAZOLAM HCL 2 MG/2ML IJ SOLN
INTRAMUSCULAR | Status: AC
  Filled 2018-03-18: qty 2

## 2018-03-18 MED ORDER — LIDOCAINE VISCOUS HCL 2 % MT SOLN
OROMUCOSAL | Status: AC
  Filled 2018-03-18: qty 15

## 2018-03-18 NOTE — Anesthesia Procedure Notes (Signed)
Procedure Name: MAC Date/Time: 03/18/2018 8:40 AM Performed by: Vista Deck, CRNA Pre-anesthesia Checklist: Patient identified, Emergency Drugs available, Suction available, Timeout performed and Patient being monitored Patient Re-evaluated:Patient Re-evaluated prior to induction Oxygen Delivery Method: Nasal Cannula

## 2018-03-18 NOTE — Anesthesia Preprocedure Evaluation (Signed)
Anesthesia Evaluation  Patient identified by MRN, date of birth, ID band Patient awake    Reviewed: Allergy & Precautions, H&P , NPO status , Patient's Chart, lab work & pertinent test results, reviewed documented beta blocker date and time   Airway Mallampati: II  TM Distance: >3 FB Neck ROM: full    Dental no notable dental hx. (+) Teeth Intact, Dental Advidsory Given   Pulmonary neg pulmonary ROS, pneumonia, resolved,    Pulmonary exam normal breath sounds clear to auscultation       Cardiovascular Exercise Tolerance: Good hypertension, On Medications +CHF  negative cardio ROS   Rhythm:regular Rate:Normal     Neuro/Psych PSYCHIATRIC DISORDERS Anxiety Depression negative neurological ROS  negative psych ROS   GI/Hepatic negative GI ROS, Neg liver ROS, GERD  ,(+) Hepatitis -  Endo/Other  negative endocrine ROS  Renal/GU negative Renal ROS  negative genitourinary   Musculoskeletal   Abdominal   Peds  Hematology negative hematology ROS (+)   Anesthesia Other Findings Dx with pneumonia in May, states resolved  Reproductive/Obstetrics negative OB ROS                             Anesthesia Physical Anesthesia Plan  ASA: III  Anesthesia Plan: MAC   Post-op Pain Management:    Induction:   PONV Risk Score and Plan:   Airway Management Planned:   Additional Equipment:   Intra-op Plan:   Post-operative Plan:   Informed Consent: I have reviewed the patients History and Physical, chart, labs and discussed the procedure including the risks, benefits and alternatives for the proposed anesthesia with the patient or authorized representative who has indicated his/her understanding and acceptance.   Dental Advisory Given  Plan Discussed with: CRNA and Anesthesiologist  Anesthesia Plan Comments:         Anesthesia Quick Evaluation
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
## 2018-03-18 NOTE — Discharge Instructions (Signed)
No aspirin or NSAIDs for 24 hours. Do not take Prilosec or omeprazole while you are on Nexium or esomeprazole. Resume other medications as before. Resume usual diet.  Eat slowly and chew food thoroughly before swallowing. No driving for 24 hours. Please call office with progress report in 1 week.      Esophagogastroduodenoscopy, Care After Refer to this sheet in the next few weeks. These instructions provide you with information about caring for yourself after your procedure. Your health care provider may also give you more specific instructions. Your treatment has been planned according to current medical practices, but problems sometimes occur. Call your health care provider if you have any problems or questions after your procedure. What can I expect after the procedure? After the procedure, it is common to have:  A sore throat.  Nausea.  Bloating.  Dizziness.  Fatigue.  Follow these instructions at home:  Do not eat or drink anything until the numbing medicine (local anesthetic) has worn off and your gag reflex has returned. You will know that the local anesthetic has worn off when you can swallow comfortably.  Do not drive for 24 hours if you received a medicine to help you relax (sedative).  If your health care provider took a tissue sample for testing during the procedure, make sure to get your test results. This is your responsibility. Ask your health care provider or the department performing the test when your results will be ready.  Keep all follow-up visits as told by your health care provider. This is important. Contact a health care provider if:  You cannot stop coughing.  You are not urinating.  You are urinating less than usual. Get help right away if:  You have trouble swallowing.  You cannot eat or drink.  You have throat or chest pain that gets worse.  You are dizzy or light-headed.  You faint.  You have nausea or vomiting.  You have  chills.  You have a fever.  You have severe abdominal pain.  You have black, tarry, or bloody stools. This information is not intended to replace advice given to you by your health care provider. Make sure you discuss any questions you have with your health care provider. Document Released: 07/20/2012 Document Revised: 01/09/2016 Document Reviewed: 06/27/2015 Elsevier Interactive Patient Education  2018 [employer]      Monitored Anesthesia Care, Care After These instructions provide you with information about caring for yourself after your procedure. Your health care provider may also give you more specific instructions. Your treatment has been planned according to current medical practices, but problems sometimes occur. Call your health care provider if you have any problems or questions after your procedure. What can I expect after the procedure? After your procedure, it is common to:  Feel sleepy for several hours.  Feel clumsy and have poor balance for several hours.  Feel forgetful about what happened after the procedure.  Have poor judgment for several hours.  Feel nauseous or vomit.  Have a sore throat if you had a breathing tube during the procedure.  Follow these instructions at home: For at least 24 hours after the procedure:   Do not: ? Participate in activities in which you could fall or become injured. ? Drive. ? Use heavy machinery. ? Drink alcohol. ? Take sleeping pills or medicines that cause drowsiness. ? Make important decisions or sign legal documents. ? Take care of children on your own.  Rest. Eating and drinking  Follow the  diet that is recommended by your health care provider.  If you vomit, drink water, juice, or soup when you can drink without vomiting.  Make sure you have little or no nausea before eating solid foods. General instructions  Have a responsible adult stay with you until you are awake and alert.  Take over-the-counter  and prescription medicines only as told by your health care provider.  If you smoke, do not smoke without supervision.  Keep all follow-up visits as told by your health care provider. This is important. Contact a health care provider if:  You keep feeling nauseous or you keep vomiting.  You feel light-headed.  You develop a rash.  You have a fever. Get help right away if:  You have trouble breathing. This information is not intended to replace advice given to you by your health care provider. Make sure you discuss any questions you have with your health care provider. Document Released: 11/24/2015 Document Revised: 03/25/2016 Document Reviewed: 11/24/2015 Elsevier Interactive Patient Education  2018 [employer]     Hiatal Hernia A hiatal hernia occurs when part of the stomach slides above the muscle that separates the abdomen from the chest (diaphragm). A person can be born with a hiatal hernia (congenital), or it may develop over time. In almost all cases of hiatal hernia, only the top part of the stomach pushes through the diaphragm. Many people have a hiatal hernia with no symptoms. The larger the hernia, the more likely it is that you will have symptoms. In some cases, a hiatal hernia allows stomach acid to flow back into the tube that carries food from your mouth to your stomach (esophagus). This may cause heartburn symptoms. Severe heartburn symptoms may mean that you have developed a condition called gastroesophageal reflux disease (GERD). What are the causes? This condition is caused by a weakness in the opening (hiatus) where the esophagus passes through the diaphragm to attach to the upper part of the stomach. A person may be born with a weakness in the hiatus, or a weakness can develop over time. What increases the risk? This condition is more likely to develop in:  Older people. Age is a major risk factor for a hiatal hernia, especially if you are over the age of  8.  Pregnant women.  People who are overweight.  People who have frequent constipation.  What are the signs or symptoms? Symptoms of this condition usually develop in the form of GERD symptoms. Symptoms include:  Heartburn.  Belching.  Indigestion.  Trouble swallowing.  Coughing or wheezing.  Sore throat.  Hoarseness.  Chest pain.  Nausea and vomiting.  How is this diagnosed? This condition may be diagnosed during testing for GERD. Tests that may be done include:  X-rays of your stomach or chest.  An upper gastrointestinal (GI) series. This is an X-ray exam of your GI tract that is taken after you swallow a chalky liquid that shows up clearly on the X-ray.  Endoscopy. This is a procedure to look into your stomach using a thin, flexible tube that has a tiny camera and light on the end of it.  How is this treated? This condition may be treated by:  Dietary and lifestyle changes to help reduce GERD symptoms.  Medicines. These may include: ? Over-the-counter antacids. ? Medicines that make your stomach empty more quickly. ? Medicines that block the production of stomach acid (H2 blockers). ? Stronger medicines to reduce stomach acid (proton pump inhibitors).  Surgery to repair  the hernia, if other treatments are not helping.  If you have no symptoms, you may not need treatment. Follow these instructions at home: Lifestyle and activity  Do not use any products that contain nicotine or tobacco, such as cigarettes and e-cigarettes. If you need help quitting, ask your health care provider.  Try to achieve and maintain a healthy body weight.  Avoid putting pressure on your abdomen. Anything that puts pressure on your abdomen increases the amount of acid that may be pushed up into your esophagus. ? Avoid bending over, especially after eating. ? Raise the head of your bed by putting blocks under the legs. This keeps your head and esophagus higher than your  stomach. ? Do not wear tight clothing around your chest or stomach. ? Try not to strain when having a bowel movement, when urinating, or when lifting heavy objects. Eating and drinking  Avoid foods that can worsen GERD symptoms. These may include: ? Fatty foods, like fried foods. ? Citrus fruits, like oranges or lemon. ? Other foods and drinks that contain acid, like orange juice or tomatoes. ? Spicy food. ? Chocolate.  Eat frequent small meals instead of three large meals a day. This helps prevent your stomach from getting too full. ? Eat slowly. ? Do not lie down right after eating. ? Do not eat 1-2 hours before bed.  Do not drink beverages with caffeine. These include cola, coffee, cocoa, and tea.  Do not drink alcohol. General instructions  Take over-the-counter and prescription medicines only as told by your health care provider.  Keep all follow-up visits as told by your health care provider. This is important. Contact a health care provider if:  Your symptoms are not controlled with medicines or lifestyle changes.  You are having trouble swallowing.  You have coughing or wheezing that will not go away. Get help right away if:  Your pain is getting worse.  Your pain spreads to your arms, neck, jaw, teeth, or back.  You have shortness of breath.  You sweat for no reason.  You feel sick to your stomach (nauseous) or you vomit.  You vomit blood.  You have bright red blood in your stools.  You have black, tarry stools. This information is not intended to replace advice given to you by your health care provider. Make sure you discuss any questions you have with your health care provider. Document Released: 10/24/2003 Document Revised: 07/27/2016 Document Reviewed: 07/27/2016 Elsevier Interactive Patient Education  [employer]2018 [employer] Inc.

## 2018-03-18 NOTE — Op Note (Signed)
[facility] Patient Name: Christian RuizJohn Ellison Procedure Date: 03/18/2018 8:20 AM MRN: [id number] Date of Birth: 11-Jul-1961 Attending MD: Lionel DecemberNajeeb Yissel Habermehl , MD CSN: [id number] Age: [id number] Admit Type: Outpatient Procedure:                Upper GI endoscopy Indications:              Esophageal dysphagia, For therapy of esophageal                            stricture Providers:                Lionel DecemberNajeeb Elecia Serafin, MD, Nena PolioLisa Moore, RN, Edythe ClarityKelly Cox,                            Technician Referring MD:             Elfredia NevinsLawrence Fusco, MD Medicines:                Propofol per Anesthesia Complications:            No immediate complications. Estimated Blood Loss:     Estimated blood loss was minimal. Procedure:                Pre-Anesthesia Assessment:                           - Prior to the procedure, a History and Physical                            was performed, and patient medications and                            allergies were reviewed. The patient's tolerance of                            previous anesthesia was also reviewed. The risks                            and benefits of the procedure and the sedation                            options and risks were discussed with the patient.                            All questions were answered, and informed consent                            was obtained. Prior Anticoagulants: The patient                            last took aspirin 1 day prior to the procedure. ASA                            Grade Assessment: III - A patient with severe  systemic disease. After reviewing the risks and                            benefits, the patient was deemed in satisfactory                            condition to undergo the procedure.                           After obtaining informed consent, the endoscope was                            passed under direct vision. Throughout the                            procedure, the patient's blood pressure,  pulse, and                            oxygen saturations were monitored continuously. The                            [id number]-H190 ([id number]) scope was introduced through the                            mouth, and advanced to the second part of duodenum.                            The upper GI endoscopy was accomplished without                            difficulty. The patient tolerated the procedure                            well. Scope In: 8:23:34 AM Scope Out: 8:37:49 AM Total Procedure Duration: 0 hours 14 minutes 15 seconds  Findings:      The proximal esophagus and mid esophagus were normal.      One benign-appearing, intrinsic mild stenosis was found 38 cm from the       incisors. The stenosis was traversed. A TTS dilator was passed through       the scope. Dilation with a 15-16.5-18 mm balloon dilator was performed       to 15 mm, 16.5 mm and 18 mm. The dilation site was examined and showed       no change and no bleeding, mucosal tear or perforation.      The Z-line was irregular and was found 39 cm from the incisors.      A 2 cm hiatal hernia was present.      Patchy mild inflammation characterized by congestion (edema) and       erythema was found in the gastric antrum and in the prepyloric region of       the stomach. Biopsies were taken with a cold forceps for histology.      The exam of the stomach was otherwise normal.      The duodenal bulb and second portion of the duodenum were normal. Impression:               -  Normal proximal esophagus and mid esophagus.                           - Benign-appearing mild esophageal stenosis                            proximal to GEJ. Dilated.                           - Z-line irregular, 39 cm from the incisors.                           - 2 cm hiatal hernia.                           - Gastritis. Biopsied.                           - Normal duodenal bulb and second portion of the                            duodenum. Moderate  Sedation:      Per Anesthesia Care Recommendation:           - Patient has a contact number available for                            emergencies. The signs and symptoms of potential                            delayed complications were discussed with the                            patient. Return to normal activities tomorrow.                            Written discharge instructions were provided to the                            patient.                           - Resume previous diet today.                           - Continue present medications.                           - No aspirin, ibuprofen, naproxen, or other                            non-steroidal anti-inflammatory drugs for 1 day.                           - Await pathology results.                           -  Telephone GI clinic in 1 week. Procedure Code(s):        --- Professional ---                           [phone], Esophagogastroduodenoscopy, flexible,                            transoral; with transendoscopic balloon dilation of                            esophagus (less than 30 mm diameter)                           [id number], Esophagogastroduodenoscopy, flexible,                            transoral; with biopsy, single or multiple Diagnosis Code(s):        --- Professional ---                           K22.2, Esophageal obstruction                           K44.9, Diaphragmatic hernia without obstruction or                            gangrene                           K29.70, Gastritis, unspecified, without bleeding                           R13.14, Dysphagia, pharyngoesophageal phase CPT copyright 2017 American Medical Association. All rights reserved. The codes documented in this report are preliminary and upon coder review may  be revised to meet current compliance requirements. Lionel December, MD Lionel December, MD 03/18/2018 8:47:56 AM This report has been signed electronically. Number of Addenda: 0

## 2018-03-18 NOTE — Anesthesia Postprocedure Evaluation (Signed)
Anesthesia Post Note  Patient: Christian Ellison  Procedure(s) Performed: ESOPHAGOGASTRODUODENOSCOPY (EGD) WITH PROPOFOL (N/A ) ESOPHAGEAL DILATION (N/A )  Patient location during evaluation: PACU Anesthesia Type: MAC Level of consciousness: awake and alert and patient cooperative Pain management: satisfactory to patient Vital Signs Assessment: post-procedure vital signs reviewed and stable Respiratory status: spontaneous breathing Cardiovascular status: stable Postop Assessment: no apparent nausea or vomiting Anesthetic complications: no     Last Vitals:  Vitals:   03/18/18 0719 03/18/18 0849  BP: 118/75 116/68  Pulse: (!) 59 60  Resp: 18 14  Temp: 36.8 C 36.5 C  SpO2: 98% 95%    Last Pain:  Vitals:   03/18/18 0849  TempSrc:   PainSc: 0-No pain                 Crystal Scarberry

## 2018-03-18 NOTE — Transfer of Care (Signed)
Immediate Anesthesia Transfer of Care Note  Patient: Anthoni Geerts Newlon  Procedure(s) Performed: ESOPHAGOGASTRODUODENOSCOPY (EGD) WITH PROPOFOL (N/A ) ESOPHAGEAL DILATION (N/A )  Patient Location: PACU  Anesthesia Type:MAC  Level of Consciousness: awake and patient cooperative  Airway & Oxygen Therapy: Patient Spontanous Breathing and Patient connected to nasal cannula oxygen  Post-op Assessment: Report given to RN and Post -op Vital signs reviewed and stable  Post vital signs: Reviewed and stable  Last Vitals:  Vitals Value Taken Time  BP    Temp    Pulse 60 03/18/2018  8:49 AM  Resp 9 03/18/2018  8:49 AM  SpO2 95 % 03/18/2018  8:49 AM  Vitals shown include unvalidated device data.  Last Pain:  Vitals:   03/18/18 0719  TempSrc: Oral  PainSc: 0-No pain      Patients Stated Pain Goal: 5 (16/10/96 0454)  Complications: No apparent anesthesia complications

## 2018-03-18 NOTE — Anesthesia Procedure Notes (Signed)
Procedure Name: MAC Date/Time: 03/18/2018 8:14 AM Performed by: Vista Deck, CRNA Pre-anesthesia Checklist: Patient identified, Emergency Drugs available, Suction available, Timeout performed and Patient being monitored Patient Re-evaluated:Patient Re-evaluated prior to induction Oxygen Delivery Method: Non-rebreather mask

## 2018-03-24 ENCOUNTER — Encounter (HOSPITAL_COMMUNITY): Payer: Self-pay | Admitting: Internal Medicine

## 2018-05-10 ENCOUNTER — Other Ambulatory Visit (HOSPITAL_COMMUNITY): Payer: Self-pay | Admitting: Internal Medicine

## 2018-05-10 ENCOUNTER — Ambulatory Visit (HOSPITAL_COMMUNITY)
Admission: RE | Admit: 2018-05-10 | Discharge: 2018-05-10 | Disposition: A | Discharge status: Home / Self Care | Payer: Self-pay | Source: Ambulatory Visit | Attending: Internal Medicine | Admitting: Internal Medicine

## 2018-05-10 DIAGNOSIS — J449 Chronic obstructive pulmonary disease, unspecified: Secondary | ICD-10-CM | POA: Insufficient documentation

## 2018-05-10 DIAGNOSIS — R05 Cough: Secondary | ICD-10-CM

## 2018-05-10 DIAGNOSIS — R059 Cough, unspecified: Secondary | ICD-10-CM

## 2018-05-26 ENCOUNTER — Encounter (HOSPITAL_COMMUNITY): Payer: Self-pay

## 2018-05-26 ENCOUNTER — Inpatient Hospital Stay (HOSPITAL_COMMUNITY)
Admission: EM | Admit: 2018-05-26 | Discharge: 2018-05-28 | DRG: 871 | Disposition: A | Discharge status: Home / Self Care | Payer: Self-pay | Attending: Internal Medicine | Admitting: Internal Medicine

## 2018-05-26 ENCOUNTER — Other Ambulatory Visit: Payer: Self-pay

## 2018-05-26 ENCOUNTER — Emergency Department (HOSPITAL_COMMUNITY): Payer: Self-pay

## 2018-05-26 DIAGNOSIS — G9341 Metabolic encephalopathy: Secondary | ICD-10-CM | POA: Diagnosis present

## 2018-05-26 DIAGNOSIS — Z23 Encounter for immunization: Secondary | ICD-10-CM

## 2018-05-26 DIAGNOSIS — Z79899 Other long term (current) drug therapy: Secondary | ICD-10-CM

## 2018-05-26 DIAGNOSIS — R0902 Hypoxemia: Secondary | ICD-10-CM | POA: Diagnosis present

## 2018-05-26 DIAGNOSIS — B359 Dermatophytosis, unspecified: Secondary | ICD-10-CM | POA: Diagnosis present

## 2018-05-26 DIAGNOSIS — J690 Pneumonitis due to inhalation of food and vomit: Secondary | ICD-10-CM

## 2018-05-26 DIAGNOSIS — D696 Thrombocytopenia, unspecified: Secondary | ICD-10-CM | POA: Diagnosis not present

## 2018-05-26 DIAGNOSIS — F419 Anxiety disorder, unspecified: Secondary | ICD-10-CM | POA: Diagnosis present

## 2018-05-26 DIAGNOSIS — E039 Hypothyroidism, unspecified: Secondary | ICD-10-CM | POA: Diagnosis present

## 2018-05-26 DIAGNOSIS — Z7982 Long term (current) use of aspirin: Secondary | ICD-10-CM

## 2018-05-26 DIAGNOSIS — K219 Gastro-esophageal reflux disease without esophagitis: Secondary | ICD-10-CM | POA: Diagnosis present

## 2018-05-26 DIAGNOSIS — D649 Anemia, unspecified: Secondary | ICD-10-CM | POA: Diagnosis not present

## 2018-05-26 DIAGNOSIS — R1310 Dysphagia, unspecified: Secondary | ICD-10-CM | POA: Diagnosis present

## 2018-05-26 DIAGNOSIS — I10 Essential (primary) hypertension: Secondary | ICD-10-CM

## 2018-05-26 DIAGNOSIS — F1910 Other psychoactive substance abuse, uncomplicated: Secondary | ICD-10-CM

## 2018-05-26 DIAGNOSIS — Z8674 Personal history of sudden cardiac arrest: Secondary | ICD-10-CM

## 2018-05-26 DIAGNOSIS — Z8619 Personal history of other infectious and parasitic diseases: Secondary | ICD-10-CM

## 2018-05-26 DIAGNOSIS — A419 Sepsis, unspecified organism: Principal | ICD-10-CM | POA: Diagnosis present

## 2018-05-26 HISTORY — DX: Pneumonitis due to inhalation of food and vomit: J69.0

## 2018-05-26 LAB — PROTIME-INR
INR: 1.01
Prothrombin Time: 13.2 seconds (ref 11.4–15.2)

## 2018-05-26 LAB — URINALYSIS, ROUTINE W REFLEX MICROSCOPIC
BILIRUBIN URINE: NEGATIVE
Bacteria, UA: NONE SEEN
GLUCOSE, UA: NEGATIVE mg/dL
KETONES UR: NEGATIVE mg/dL
LEUKOCYTES UA: NEGATIVE
NITRITE: NEGATIVE
PROTEIN: NEGATIVE mg/dL
Specific Gravity, Urine: 1.013 (ref 1.005–1.030)
pH: 5 (ref 5.0–8.0)

## 2018-05-26 LAB — CBC WITH DIFFERENTIAL/PLATELET
Abs Immature Granulocytes: 0.03 10*3/uL (ref 0.00–0.07)
BASOS PCT: 0 %
Basophils Absolute: 0 10*3/uL (ref 0.0–0.1)
EOS ABS: 0.1 10*3/uL (ref 0.0–0.5)
EOS PCT: 2 %
HEMATOCRIT: 47.4 % (ref 39.0–52.0)
Hemoglobin: 14.7 g/dL (ref 13.0–17.0)
Immature Granulocytes: 0 %
Lymphocytes Relative: 13 %
Lymphs Abs: 1 10*3/uL (ref 0.7–4.0)
MCH: 29.3 pg (ref 26.0–34.0)
MCHC: 31 g/dL (ref 30.0–36.0)
MCV: 94.6 fL (ref 80.0–100.0)
Monocytes Absolute: 0.2 10*3/uL (ref 0.1–1.0)
Monocytes Relative: 3 %
Neutro Abs: 6.6 10*3/uL (ref 1.7–7.7)
Neutrophils Relative %: 82 %
PLATELETS: 215 10*3/uL (ref 150–400)
RBC: 5.01 MIL/uL (ref 4.22–5.81)
RDW: 12.5 % (ref 11.5–15.5)
WBC: 8 10*3/uL (ref 4.0–10.5)
nRBC: 0 % (ref 0.0–0.2)

## 2018-05-26 LAB — RESPIRATORY PANEL BY PCR
ADENOVIRUS-RVPPCR: NOT DETECTED
Bordetella pertussis: NOT DETECTED
CHLAMYDOPHILA PNEUMONIAE-RVPPCR: NOT DETECTED
CORONAVIRUS 229E-RVPPCR: NOT DETECTED
CORONAVIRUS NL63-RVPPCR: NOT DETECTED
CORONAVIRUS OC43-RVPPCR: NOT DETECTED
Coronavirus HKU1: NOT DETECTED
INFLUENZA B-RVPPCR: NOT DETECTED
Influenza A: NOT DETECTED
MYCOPLASMA PNEUMONIAE-RVPPCR: NOT DETECTED
Metapneumovirus: NOT DETECTED
PARAINFLUENZA VIRUS 1-RVPPCR: NOT DETECTED
PARAINFLUENZA VIRUS 4-RVPPCR: NOT DETECTED
Parainfluenza Virus 2: NOT DETECTED
Parainfluenza Virus 3: NOT DETECTED
Respiratory Syncytial Virus: NOT DETECTED
Rhinovirus / Enterovirus: NOT DETECTED

## 2018-05-26 LAB — I-STAT CG4 LACTIC ACID, ED
LACTIC ACID, VENOUS: 5 mmol/L — AB (ref 0.5–1.9)
Lactic Acid, Venous: 4.2 mmol/L (ref 0.5–1.9)

## 2018-05-26 LAB — COMPREHENSIVE METABOLIC PANEL
ALT: 16 U/L (ref 0–44)
ANION GAP: 15 (ref 5–15)
AST: 23 U/L (ref 15–41)
Albumin: 4.2 g/dL (ref 3.5–5.0)
Alkaline Phosphatase: 52 U/L (ref 38–126)
BILIRUBIN TOTAL: 0.8 mg/dL (ref 0.3–1.2)
BUN: 10 mg/dL (ref 6–20)
CHLORIDE: 103 mmol/L (ref 98–111)
CO2: 23 mmol/L (ref 22–32)
Calcium: 9.4 mg/dL (ref 8.9–10.3)
Creatinine, Ser: 0.78 mg/dL (ref 0.61–1.24)
Glucose, Bld: 97 mg/dL (ref 70–99)
POTASSIUM: 3.9 mmol/L (ref 3.5–5.1)
Sodium: 141 mmol/L (ref 135–145)
TOTAL PROTEIN: 7.6 g/dL (ref 6.5–8.1)

## 2018-05-26 LAB — I-STAT VENOUS BLOOD GAS, ED
Acid-Base Excess: 3 mmol/L — ABNORMAL HIGH (ref 0.0–2.0)
Bicarbonate: 30.3 mmol/L — ABNORMAL HIGH (ref 20.0–28.0)
O2 SAT: 62 %
PH VEN: 7.336 (ref 7.250–7.430)
PO2 VEN: 35 mmHg (ref 32.0–45.0)
TCO2: 32 mmol/L (ref 22–32)
pCO2, Ven: 56.7 mmHg (ref 44.0–60.0)

## 2018-05-26 LAB — ETHANOL: Alcohol, Ethyl (B): <10 mg/dL (ref <10)

## 2018-05-26 LAB — SALICYLATE LEVEL

## 2018-05-26 LAB — ACETAMINOPHEN LEVEL

## 2018-05-26 LAB — MRSA PCR SCREENING: MRSA BY PCR: NEGATIVE

## 2018-05-26 MED ORDER — SODIUM CHLORIDE 0.9 % IV BOLUS (SEPSIS)
1000.0000 mL | Freq: Once | INTRAVENOUS | Status: AC
  Administered 2018-05-26: 1000 mL via INTRAVENOUS

## 2018-05-26 MED ORDER — ORAL CARE MOUTH RINSE
15.0000 mL | Freq: Two times a day (BID) | OROMUCOSAL | Status: DC
  Administered 2018-05-26 – 2018-05-27 (×3): 15 mL via OROMUCOSAL

## 2018-05-26 MED ORDER — ONDANSETRON HCL 4 MG PO TABS: 4.0000 mg | ORAL_TABLET | Freq: Four times a day (QID) | ORAL | Status: DC | PRN

## 2018-05-26 MED ORDER — ALPRAZOLAM 0.5 MG PO TABS
0.5000 mg | ORAL_TABLET | Freq: Every day | ORAL | Status: DC
  Administered 2018-05-26 – 2018-05-27 (×2): 0.5 mg via ORAL
  Filled 2018-05-26 (×2): qty 1

## 2018-05-26 MED ORDER — ALBUTEROL SULFATE (2.5 MG/3ML) 0.083% IN NEBU: 2.5000 mg | INHALATION_SOLUTION | Freq: Four times a day (QID) | RESPIRATORY_TRACT | Status: DC

## 2018-05-26 MED ORDER — ACETAMINOPHEN 650 MG RE SUPP
650.0000 mg | Freq: Once | RECTAL | Status: AC
  Administered 2018-05-26: 650 mg via RECTAL
  Filled 2018-05-26: qty 1

## 2018-05-26 MED ORDER — VANCOMYCIN HCL IN DEXTROSE 1-5 GM/200ML-% IV SOLN
1000.0000 mg | Freq: Three times a day (TID) | INTRAVENOUS | Status: DC
  Administered 2018-05-27: 1000 mg via INTRAVENOUS
  Filled 2018-05-26 (×2): qty 200

## 2018-05-26 MED ORDER — ATENOLOL 50 MG PO TABS
25.0000 mg | ORAL_TABLET | Freq: Every day | ORAL | Status: DC
  Administered 2018-05-27: 25 mg via ORAL
  Filled 2018-05-26: qty 1

## 2018-05-26 MED ORDER — ALBUTEROL SULFATE (2.5 MG/3ML) 0.083% IN NEBU
2.5000 mg | INHALATION_SOLUTION | RESPIRATORY_TRACT | Status: DC | PRN
  Administered 2018-05-27: 2.5 mg via RESPIRATORY_TRACT
  Filled 2018-05-26: qty 3

## 2018-05-26 MED ORDER — ONDANSETRON HCL 4 MG/2ML IJ SOLN: 4.0000 mg | Freq: Four times a day (QID) | INTRAMUSCULAR | Status: DC | PRN

## 2018-05-26 MED ORDER — ACETAMINOPHEN 650 MG RE SUPP: 650.0000 mg | Freq: Four times a day (QID) | RECTAL | Status: DC | PRN

## 2018-05-26 MED ORDER — GUAIFENESIN ER 600 MG PO TB12
600.0000 mg | ORAL_TABLET | Freq: Two times a day (BID) | ORAL | Status: DC
  Administered 2018-05-26 – 2018-05-28 (×4): 600 mg via ORAL
  Filled 2018-05-26 (×4): qty 1

## 2018-05-26 MED ORDER — ACETAMINOPHEN 325 MG PO TABS: 650.0000 mg | ORAL_TABLET | Freq: Four times a day (QID) | ORAL | Status: DC | PRN

## 2018-05-26 MED ORDER — ALBUTEROL SULFATE (2.5 MG/3ML) 0.083% IN NEBU
2.5000 mg | INHALATION_SOLUTION | Freq: Four times a day (QID) | RESPIRATORY_TRACT | Status: DC
  Administered 2018-05-26: 2.5 mg via RESPIRATORY_TRACT
  Filled 2018-05-26: qty 3

## 2018-05-26 MED ORDER — SODIUM CHLORIDE 0.9 % IV SOLN
1.0000 g | Freq: Three times a day (TID) | INTRAVENOUS | Status: DC
  Administered 2018-05-26 – 2018-05-28 (×5): 1 g via INTRAVENOUS
  Filled 2018-05-26 (×7): qty 1

## 2018-05-26 MED ORDER — VANCOMYCIN HCL IN DEXTROSE 1-5 GM/200ML-% IV SOLN
1000.0000 mg | Freq: Once | INTRAVENOUS | Status: AC
  Administered 2018-05-26: 1000 mg via INTRAVENOUS
  Filled 2018-05-26: qty 200

## 2018-05-26 MED ORDER — ENOXAPARIN SODIUM 40 MG/0.4ML ~~LOC~~ SOLN
40.0000 mg | SUBCUTANEOUS | Status: DC
  Administered 2018-05-26 – 2018-05-27 (×2): 40 mg via SUBCUTANEOUS
  Filled 2018-05-26 (×2): qty 0.4

## 2018-05-26 MED ORDER — LEVOTHYROXINE SODIUM 25 MCG PO TABS
25.0000 ug | ORAL_TABLET | Freq: Every day | ORAL | Status: DC
  Administered 2018-05-27 – 2018-05-28 (×2): 25 ug via ORAL
  Filled 2018-05-26 (×2): qty 1

## 2018-05-26 MED ORDER — ACETAMINOPHEN ER 650 MG PO TBCR: 650.0000 mg | EXTENDED_RELEASE_TABLET | Freq: Three times a day (TID) | ORAL | Status: DC | PRN

## 2018-05-26 MED ORDER — SODIUM CHLORIDE 0.9 % IV SOLN
500.0000 mg | INTRAVENOUS | Status: DC
  Filled 2018-05-26: qty 500

## 2018-05-26 MED ORDER — VANCOMYCIN HCL IN DEXTROSE 750-5 MG/150ML-% IV SOLN
750.0000 mg | Freq: Once | INTRAVENOUS | Status: AC
  Administered 2018-05-26: 750 mg via INTRAVENOUS
  Filled 2018-05-26: qty 150

## 2018-05-26 MED ORDER — SODIUM CHLORIDE 0.9 % IV SOLN
2.0000 g | INTRAVENOUS | Status: DC
  Filled 2018-05-26: qty 20

## 2018-05-26 MED ORDER — PANTOPRAZOLE SODIUM 40 MG PO TBEC
40.0000 mg | DELAYED_RELEASE_TABLET | Freq: Every day | ORAL | Status: DC
  Administered 2018-05-27 – 2018-05-28 (×2): 40 mg via ORAL
  Filled 2018-05-26 (×2): qty 1

## 2018-05-26 MED ORDER — DEXTROSE-NACL 5-0.45 % IV SOLN
INTRAVENOUS | Status: DC
  Administered 2018-05-26 – 2018-05-28 (×3): via INTRAVENOUS

## 2018-05-26 MED ORDER — SODIUM CHLORIDE 0.9 % IV SOLN
2.0000 g | Freq: Once | INTRAVENOUS | Status: AC
  Administered 2018-05-26: 2 g via INTRAVENOUS
  Filled 2018-05-26: qty 2

## 2018-05-26 MED ORDER — LACTATED RINGERS IV BOLUS
1000.0000 mL | Freq: Once | INTRAVENOUS | Status: AC
  Administered 2018-05-26: 1000 mL via INTRAVENOUS

## 2018-05-26 NOTE — H&P (Signed)
[facility]                                                                                    Patient Demographics  Christian Ellison, is a 57 y.o. male  CSN: [id number]  MRN: [id number]  DOB - 15-Jul-1961  Admit Date - 05/26/2018  Outpatient Primary MD for the patient is Elfredia Nevins, MD   With History of -  Past Medical History:  Diagnosis Date  . Anxiety   . Chronic pain   . GERD (gastroesophageal reflux disease)   . Hepatitis   . Opiate abuse, continuous (HCC)       Past Surgical History:  Procedure Laterality Date  . BIOPSY  03/18/2018   Procedure: BIOPSY;  Surgeon: Malissa Hippo, MD;  [facility];  Service: Endoscopy;;  gastric   . COLONOSCOPY N/A 11/22/2013   Procedure: COLONOSCOPY;  Surgeon: Malissa Hippo, MD;  [facility];  Service: Endoscopy;  Laterality: N/A;  1030  . ESOPHAGEAL DILATION N/A 03/18/2018   Procedure: ESOPHAGEAL DILATION;  Surgeon: Malissa Hippo, MD;  [facility];  Service: Endoscopy;  Laterality: N/A;  . ESOPHAGOGASTRODUODENOSCOPY (EGD) WITH PROPOFOL N/A 03/18/2018   Procedure: ESOPHAGOGASTRODUODENOSCOPY (EGD) WITH PROPOFOL;  Surgeon: Malissa Hippo, MD;  [facility];  Service: Endoscopy;  Laterality: N/A;  8:25  . Fracture Right Leg     Patient has rod/screws in this leg  . Rotor Cuff  2012   Right Shoulder    in for   Chief Complaint  Patient presents with  . Fever     HPI  Christian Ellison  is a 57 y.o. 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  The patient was treated 10 days ago as an outpatient with Rocephin IM and doxycycline p.o. by his primary medical doctor however today he was noted  at home with frothy sputum and altered mental status.  Patient fianc reports that he has not improved.  No report of fever  although he was found to be febrile in the emergency room.  Patient mental status improved slightly in the emergency room however he's still confused.  Patient is on Xanax at home and denies any alcoholism or pain medications intake.  Patient has a bedside pulse oximeter at home and his saturation was ranging between 80 and 83%.  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  His family is concerned about TB since he stays with his mother who receives BCG treatment for urinary cancer.  I explained to them that this is very low risk.  His urine drug test came back positive for benzo and THC.  Review of Systems    Unable to obtain due to mental status  Social History Social History   Tobacco Use  . Smoking status: Never Smoker  . Smokeless tobacco: Never Used  Substance Use Topics  . Alcohol use: No    Comment: Patient states that it has been over a year     Family History Family History  Problem Relation Age of Onset  . Breast cancer Mother   . Colon cancer Father   . Bladder Cancer Father   . Prostate cancer Father   . Hypertension Sister   . Hypothyroidism Sister   . Healthy Sister   . Healthy Daughter   . Healthy Son   . Healthy Son      Prior to Admission medications   Medication Sig Start Date End Date Taking? Authorizing Provider  acetaminophen (TYLENOL) 650 MG CR tablet Take 650 mg by mouth every 8 (eight) hours as needed for pain.   Yes [provider]  ALPRAZolam Prudy Feeler) 0.5 MG tablet Take 0.5 mg by mouth at bedtime.   Yes [provider]  atenolol (TENORMIN) 25 MG tablet Take 25 mg by mouth  daily.   Yes [provider]  esomeprazole (NEXIUM) 40 MG capsule Take 1 capsule (40 mg total) by mouth daily. Patient taking differently: Take 40 mg by mouth at bedtime.  02/03/18 02/03/19 Yes Erick Blinks, MD  levothyroxine (SYNTHROID, LEVOTHROID) 25 MCG tablet Take 25 mcg by mouth daily. 05/13/18  Yes [provider]  Multiple Vitamin (MULTIVITAMIN WITH MINERALS) TABS tablet Take 1 tablet by mouth daily. 50+   Yes [provider]  doxycycline (VIBRA-TABS) 100 MG tablet Take 100 mg by mouth 2 (two) times daily. 05/10/18   [provider]    No Known Allergies  Physical Exam  Vitals  Blood pressure 124/73, pulse (!) 109, temperature (!) 103.3 F (39.6 C), temperature source Oral, resp. rate (!) 23, height 6' (1.829 m), weight 83.9 kg, SpO2 97 %.   1. General well-developed, well-nourished , somnolent, still slow  2.  Flat affect and insight, Not Suicidal or Homicidal, confused.  3. No F.N deficits, grossly, patient moving all extremities but could not evaluate significantly due to altered mental status.  4. Ears and Eyes appear Normal, Conjunctivae clear, PERRLA. Moist Oral Mucosa.  5. Supple Neck, No JVD, No cervical lymphadenopathy appriciated, No Carotid Bruits.  6. Symmetrical Chest wall movement, scattered rhonchi especially on the right.  7. RRR, No Gallops, Rubs or Murmurs, No Parasternal Heave.  8. Positive Bowel Sounds, Abdomen Soft, Non tender, No organomegaly appriciated,.  9.  No Cyanosis, Normal Skin Turgor, No Skin Rash or Bruise.  10. Good muscle tone,  joints appear normal , no effusions, Normal ROM.    Data Review  CBC Recent Labs  Lab 05/26/18 1134  WBC 8.0  HGB 14.7  HCT 47.4  PLT 215  MCV 94.6  MCH 29.3  MCHC 31.0  RDW 12.5  LYMPHSABS 1.0  MONOABS 0.2  EOSABS 0.1  BASOSABS 0.0    ------------------------------------------------------------------------------------------------------------------  Chemistries  Recent Labs  Lab 05/26/18 1134  NA 141  K 3.9  CL 103  CO2 23  GLUCOSE 97  BUN 10  CREATININE 0.78  CALCIUM 9.4  AST 23  ALT 16  ALKPHOS 52  BILITOT 0.8   ------------------------------------------------------------------------------------------------------------------ estimated creatinine clearance is 113.2 mL/min (by C-G formula based on SCr of 0.78 mg/dL). ------------------------------------------------------------------------------------------------------------------ No results for input(s): TSH, T4TOTAL, T3FREE, THYROIDAB in the last 72  hours.  Invalid input(s): FREET3   Coagulation profile Recent Labs  Lab 05/26/18 1134  INR 1.01   ------------------------------------------------------------------------------------------------------------------- No results for input(s): DDIMER in the last 72 hours. -------------------------------------------------------------------------------------------------------------------  Cardiac Enzymes No results for input(s): CKMB, TROPONINI, MYOGLOBIN in the last 168 hours.  Invalid input(s): CK ------------------------------------------------------------------------------------------------------------------ Invalid input(s): POCBNP   ---------------------------------------------------------------------------------------------------------------  Urinalysis    Component Value Date/Time   COLORURINE YELLOW 05/26/2018 1129   APPEARANCEUR CLEAR 05/26/2018 1129   LABSPEC 1.013 05/26/2018 1129   PHURINE 5.0 05/26/2018 1129   GLUCOSEU NEGATIVE 05/26/2018 1129   HGBUR SMALL (A) 05/26/2018 1129   BILIRUBINUR NEGATIVE 05/26/2018 1129   KETONESUR NEGATIVE 05/26/2018 1129   PROTEINUR NEGATIVE 05/26/2018 1129   NITRITE NEGATIVE 05/26/2018 1129   LEUKOCYTESUR NEGATIVE 05/26/2018 1129     ----------------------------------------------------------------------------------------------------------------   Imaging results:   Dg Chest 2 View  Result Date: 05/11/2018 CLINICAL DATA:  Cough. EXAM: CHEST - 2 VIEW COMPARISON:  02/03/2018. FINDINGS: Normal sized heart. Mild diffuse peribronchial thickening. Small amount of increased density in the right mid lung zone on the frontal view, not seen on the lateral view. This appears to be due to overlapping ribs and vessels. Otherwise, clear lungs. The lungs are mildly hyperexpanded. Thoracolumbar spine degenerative changes. IMPRESSION: Mild bronchitic changes and mild changes of COPD. Electronically Signed   By: Beckie Salts M.D.   On: 05/11/2018 09:06   Dg Chest Port 1 View  Result Date: 05/26/2018 CLINICAL DATA:  Fever, history of pneumonia EXAM: PORTABLE CHEST 1 VIEW COMPARISON:  Chest x-ray of 05/10/2017 and 01/02/2018 FINDINGS: There is now parenchymal opacity at the right lung base most consistent with right basilar pneumonia. Minimally prominent markings are present at the left lung base. No pleural effusion is seen. Heart size is stable. IMPRESSION: Right basilar opacity consistent with pneumonia. Electronically Signed   By: Dwyane Dee M.D.   On: 05/26/2018 12:47    My personal review of EKG: Sinus tach with left axis deviation and nonspecific intraventricular conduction delay    Assessment & Plan  1.  Pneumonia with sepsis; with hypoxemia and altered mental status Patient has history of aspiration pneumonia in the distant past Start IV antibiotics vancomycin and cefepime Continue with nebulizer treatments  2.  History of GERD and esophageal stricture status post dilatation and 8/19      Continue with Nexium  3.  History of substance abuse with alcoholism and pain medications; patient reports abstinence     Patient still taking Xanax as needed for sleep  4.  Hypertension      Tinea with atenolol  5.   Hypothyroidism      20 with Synthroid and check TSH  6.  Anxiety     Continue with Xanax nightly  7.  History of hepatitis C   DVT Prophylaxis Lovenox  AM Labs Ordered, also please review Full Orders  Family Communication: Admission, patients condition and plan of care including tests being ordered have been discussed with the patient and sister and fianc who indicate understanding and agree with the plan and Code Status.  Code Status full  Disposition Plan: Home  Time spent in minutes : 48 minutes  Condition GUARDED   @SIGNATURE @

## 2018-05-26 NOTE — Progress Notes (Signed)
CSW received consult regarding lack of insurance. If patient qualifies for Medicaid, financial counseling will screen patient.  CSW will sign off as there are no other CSW needs identified.   Osborne Casco Ambyr Qadri LCSW [phone]

## 2018-05-26 NOTE — ED Triage Notes (Signed)
Pt with fiance in triage reports history of pneumonia and being septic.

## 2018-05-26 NOTE — Plan of Care (Signed)
  Problem: Clinical Measurements: Goal: Respiratory complications will improve Outcome: Progressing   Problem: Clinical Measurements: Goal: Ability to maintain clinical measurements within normal limits will improve Outcome: Progressing   

## 2018-05-26 NOTE — ED Notes (Signed)
Attempted report 

## 2018-05-26 NOTE — ED Notes (Signed)
Admitting MD in room at this time 

## 2018-05-26 NOTE — Progress Notes (Signed)
Pharmacy Antibiotic Note  Christian Ellison is a 57 y.o. male admitted on 05/26/2018 with pneumonia.  Pharmacy has been consulted for Vancomycin dosing. WBC - 8.0, Tmax - 103.3, lactic acid - 4.2, Scr- 0.78 CXR - right basilar opacity consistent w/ PNA  Plan: -Will give vancomycin 750mg  x1, for a total loading dose of 1750mg , then will give vancomycin 1g q8h -Follow up cefepime  -Monitor and adjust abx per C&S, vanc trough as needed   Height: 6' (182.9 cm) Weight: 185 lb (83.9 kg) IBW/kg (Calculated) : 77.6  Temp (24hrs), Avg:103.3 F (39.6 C), Min:103.3 F (39.6 C), Max:103.3 F (39.6 C)  Recent Labs  Lab 05/26/18 1134 05/26/18 1153 05/26/18 1337  WBC 8.0  --   --   CREATININE 0.78  --   --   LATICACIDVEN  --  5.00* 4.20*    Estimated Creatinine Clearance: 113.2 mL/min (by C-G formula based on SCr of 0.78 mg/dL).    No Known Allergies  Antimicrobials this admission: Cefepime 10/10 x1 Vancomycin 10/10 >>   Dose adjustments this admission: N/A  Microbiology results: Pending  Thank you for allowing pharmacy to be a part of this patient's care.  Koleen Nimrod 05/26/2018 3:29 PM

## 2018-05-26 NOTE — ED Provider Notes (Signed)
[facility] EMERGENCY DEPARTMENT Provider Note   CSN: [id number] Arrival date & time: 05/26/18  1119     History   Chief Complaint Chief Complaint  Patient presents with  . Fever    HPI Christian Ellison is a 57 y.o. male.  HPI   57 year old male with PMH significant for CHF, esophageal stricture, history of HCV, history of opiate abuse, history of respiratory failure secondary to aspiration pneumonia with ICU stay, presents with chief complaint of concern for aspiration pneumonia.  History obtained from patient and partner in the room.  Partner states that he has had reflux for the last several weeks.  Yesterday patient began coughing up liquid/sputum.  Last night patient drank a carbonated beverage and laid down immediately.  Partner noticed gurgling from the patient which is abnormal for him.  Parent stated that today patient was just not acting himself, seemed sleepier than usual, had a "gurgly" and laboring breathing.  Partner listen to his chest with a stethoscope, thought he had pneumonia prompting her presentation to the emergency department.  Patient with recent treatment of outpatient for community acquired pneumonia with doxycycline, last dose 10 days ago.  Denies urinary symptoms, diarrhea, or rash.  Past Medical History:  Diagnosis Date  . Anxiety   . Chronic pain   . GERD (gastroesophageal reflux disease)   . Hepatitis   . Opiate abuse, continuous [facility])     Patient Active Problem List   Diagnosis Date Noted  . Pulmonary edema 02/02/2018  . HCAP (healthcare-associated pneumonia) 02/02/2018  . Weakness generalized 02/02/2018  . Hypertension 02/02/2018  . CHF (congestive heart failure) (HCC) 02/02/2018  . Nausea & vomiting 02/02/2018  . Dysphagia 02/02/2018  . Esophageal stricture 02/02/2018  . Esophageal stenosis 02/02/2018  . GERD (gastroesophageal reflux disease) 02/02/2018  . Polysubstance abuse (HCC) 02/02/2018  . Acute respiratory  failure with hypoxia (HCC)   . Transaminitis   . Elevated troponin   . Overdose 01/02/2018  . Hx of hepatitis C 01/17/2013  . Depression 01/17/2013  . Anxiety 01/17/2013  . Right knee pain 01/17/2013    Past Surgical History:  Procedure Laterality Date  . BIOPSY  03/18/2018   Procedure: BIOPSY;  Surgeon: Malissa Hippo, MD;  [facility];  Service: Endoscopy;;  gastric   . COLONOSCOPY N/A 11/22/2013   Procedure: COLONOSCOPY;  Surgeon: Malissa Hippo, MD;  [facility];  Service: Endoscopy;  Laterality: N/A;  1030  . ESOPHAGEAL DILATION N/A 03/18/2018   Procedure: ESOPHAGEAL DILATION;  Surgeon: Malissa Hippo, MD;  [facility];  Service: Endoscopy;  Laterality: N/A;  . ESOPHAGOGASTRODUODENOSCOPY (EGD) WITH PROPOFOL N/A 03/18/2018   Procedure: ESOPHAGOGASTRODUODENOSCOPY (EGD) WITH PROPOFOL;  Surgeon: Malissa Hippo, MD;  [facility];  Service: Endoscopy;  Laterality: N/A;  8:25  . Fracture Right Leg     Patient has rod/screws in this leg  . Rotor Cuff  2012   Right Shoulder        Home Medications    Prior to Admission medications   Medication Sig Start Date End Date Taking? Authorizing Provider  acetaminophen (TYLENOL) 650 MG CR tablet Take 650 mg by mouth every 8 (eight) hours as needed for pain.    [provider]  ALPRAZolam Prudy Feeler) 0.5 MG tablet Take 0.5 mg by mouth at bedtime.    [provider]  aspirin EC 81 MG tablet Take 1 tablet (81 mg total) by mouth daily. 03/19/18  Rehman, Joline Maxcy, MD  atenolol (TENORMIN) 25 MG tablet Take 25 mg by mouth daily.    [provider]  esomeprazole (NEXIUM) 40 MG capsule Take 1 capsule (40 mg total) by mouth daily. Patient taking differently: Take 40 mg by mouth at bedtime.  02/03/18 02/03/19  Erick Blinks, MD  Multiple Vitamin (MULTIVITAMIN WITH MINERALS) TABS tablet Take 1 tablet by mouth daily. 50+    [provider]    Family History Family History   Problem Relation Age of Onset  . Breast cancer Mother   . Colon cancer Father   . Bladder Cancer Father   . Prostate cancer Father   . Hypertension Sister   . Hypothyroidism Sister   . Healthy Sister   . Healthy Daughter   . Healthy Son   . Healthy Son     Social History Social History   Tobacco Use  . Smoking status: Never Smoker  . Smokeless tobacco: Never Used  Substance Use Topics  . Alcohol use: No    Comment: Patient states that it has been over a year  . Drug use: Yes    Comment: opiates     Allergies   Patient has no known allergies.   Review of Systems Review of Systems  Constitutional: Positive for fatigue. Negative for chills and fever.  HENT: Negative for ear pain and sore throat.   Eyes: Negative for pain and visual disturbance.  Respiratory: Positive for cough and shortness of breath.   Cardiovascular: Negative for chest pain, palpitations and leg swelling.  Gastrointestinal: Negative for abdominal distention, abdominal pain, blood in stool, constipation, diarrhea and vomiting.  Genitourinary: Negative for dysuria, flank pain, frequency, hematuria and urgency.  Musculoskeletal: Negative for arthralgias and back pain.  Skin: Negative for color change, rash and wound.  Neurological: Negative for seizures and syncope.  All other systems reviewed and are negative.    Physical Exam Updated Vital Signs BP 122/75   Pulse (!) 112   Temp (!) 103.3 F (39.6 C) (Oral)   Resp (!) 29   Ht 6' (1.829 m)   Wt 83.9 kg   SpO2 97%   BMI 25.09 kg/m   Physical Exam  Constitutional: He appears well-developed and well-nourished. He appears distressed.  HENT:  Head: Normocephalic and atraumatic.  Eyes: Pupils are equal, round, and reactive to light. Conjunctivae and EOM are normal.  Neck: Neck supple.  Cardiovascular: Normal rate and regular rhythm.  No murmur heard. Pulmonary/Chest: No accessory muscle usage. Tachypnea noted. No respiratory distress. He  has no decreased breath sounds. He has rhonchi in the right lower field and the left lower field. He has rales in the right middle field, the right lower field and the left lower field.  Abdominal: Soft. He exhibits no distension and no mass. There is no tenderness. There is no rebound and no guarding.  Musculoskeletal: He exhibits no edema.  Neurological: He is alert.  Skin: Skin is warm. Capillary refill takes less than 2 seconds. He is diaphoretic.  Psychiatric: He has a normal mood and affect.  Nursing note and vitals reviewed.    ED Treatments / Results  Labs (all labs ordered are listed, but only abnormal results are displayed) Labs Reviewed  ACETAMINOPHEN LEVEL - Abnormal; Notable for the following components:      Result Value   Acetaminophen (Tylenol), Serum <10 (*)    All other components within normal limits  I-STAT CG4 LACTIC ACID, ED - Abnormal; Notable for the following  components:   Lactic Acid, Venous 5.00 (*)    All other components within normal limits  I-STAT VENOUS BLOOD GAS, ED - Abnormal; Notable for the following components:   Bicarbonate 30.3 (*)    Acid-Base Excess 3.0 (*)    All other components within normal limits  I-STAT CG4 LACTIC ACID, ED - Abnormal; Notable for the following components:   Lactic Acid, Venous 4.20 (*)    All other components within normal limits  CULTURE, BLOOD (ROUTINE X 2)  CULTURE, BLOOD (ROUTINE X 2)  URINE CULTURE  RESPIRATORY PANEL BY PCR  COMPREHENSIVE METABOLIC PANEL  CBC WITH DIFFERENTIAL/PLATELET  PROTIME-INR  ETHANOL  SALICYLATE LEVEL  URINALYSIS, ROUTINE W REFLEX MICROSCOPIC  RAPID URINE DRUG SCREEN, HOSP PERFORMED    EKG EKG Interpretation  Date/Time:  Thursday May 26 2018 11:22:53 EDT Ventricular Rate:  126 PR Interval:  128 QRS Duration: 68 QT Interval:  320 QTC Calculation: 463 R Axis:   -55 Text Interpretation:  Sinus tachycardia Left axis deviation Septal infarct , age undetermined Abnormal ECG  Since last tracing rate faster Confirmed by Richardean Canal ([id number]) on 05/26/2018 12:03:03 PM   Radiology Dg Chest Port 1 View  Result Date: 05/26/2018 CLINICAL DATA:  Fever, history of pneumonia EXAM: PORTABLE CHEST 1 VIEW COMPARISON:  Chest x-ray of 05/10/2017 and 01/02/2018 FINDINGS: There is now parenchymal opacity at the right lung base most consistent with right basilar pneumonia. Minimally prominent markings are present at the left lung base. No pleural effusion is seen. Heart size is stable. IMPRESSION: Right basilar opacity consistent with pneumonia. Electronically Signed   By: Dwyane Dee M.D.   On: 05/26/2018 12:47    Procedures Procedures (including critical care time)  Medications Ordered in ED Medications  sodium chloride 0.9 % bolus 1,000 mL (0 mLs Intravenous Stopped 05/26/18 1345)    And  sodium chloride 0.9 % bolus 1,000 mL (1,000 mLs Intravenous New Bag/Given 05/26/18 1344)    And  sodium chloride 0.9 % bolus 1,000 mL (has no administration in time range)  vancomycin (VANCOCIN) IVPB 1000 mg/200 mL premix (1,000 mg Intravenous New Bag/Given 05/26/18 1345)  acetaminophen (TYLENOL) suppository 650 mg (650 mg Rectal Given 05/26/18 1207)  ceFEPIme (MAXIPIME) 2 g in sodium chloride 0.9 % 100 mL IVPB (0 g Intravenous Stopped 05/26/18 1316)  lactated ringers bolus 1,000 mL (0 mLs Intravenous Stopped 05/26/18 1316)     Initial Impression / Assessment and Plan / ED Course  I have reviewed the triage vital signs and the nursing notes.  Pertinent labs & imaging results that were available during my care of the patient were reviewed by me and considered in my medical decision making (see chart for details).     57 year old male with PMH treatment for CHF, esophageal stricture, history of HCV, history of opiate abuse, history of respiratory failure secondary to aspiration pneumonia with ICU stay, presents with chief complaint of concern for aspiration pneumonia.  History as above.   History concerning for aspiration pneumonia versus partially treated acute acquired pneumonia status post antibiotic.  Patient febrile to 103.3 on arrival, tachycardic to 130s, hypoxic to 91%, hypertensive.  Patient activated sepsis, blood cultures drawn, 30 cc/kg fluid bolus given, patient appeared to be treated for aspiration pneumonia.  Labs and imaging performed.  Initial tach acid 5.0.  CBC and BMP without sniffing abnormality.  CXR reveals right-sided pneumonia.  Initial VBG 7.33/56/35.  Pending RVP.  Patient admitted to hospitalist for management of severe sepsis without shock  secondary to aspiration pneumonia.  Patient seen in conjunction with my attending Dr. Silverio Lay who agrees with the plan and disposition.  Final Clinical Impressions(s) / ED Diagnoses   Final diagnoses:  Aspiration pneumonia of right lower lobe, unspecified aspiration pneumonia type [facility])    ED Discharge Orders    None       Margit Banda, MD 05/26/18 1348    Charlynne Pander, MD 05/27/18 [phone]

## 2018-05-26 NOTE — Progress Notes (Signed)
Christian Ellison [id number] Admission Data: 05/26/2018 4:37 PM Attending Provider: Carron Curie, MD  WUJ:[id number], [name], MD Consults/ Treatment Team:   Christian Ellison is a 57 y.o. male patient admitted from ED awake, alert  & orientated  X 3,  Full Code, VSS - Blood pressure 106/61, pulse 99, temperature 99.3 F (37.4 C), temperature source Oral, resp. rate (!) 22, height 6' (1.829 m), weight 83.9 kg, SpO2 98 %., O2    2 L nasal cannular, no c/o shortness of breath, no c/o chest pain, no distress noted. Tele # [id number] placed and pt is currently running:sinus tachycardia.   IV site WDL:  antecubital right, condition patent and no redness with a transparent dsg that's clean dry and intact.  Allergies:  No Known Allergies   Past Medical History:  Diagnosis Date  . Anxiety   . Chronic pain   . GERD (gastroesophageal reflux disease)   . Hepatitis   . Opiate abuse, continuous (HCC)     Pt orientation to unit, room and routine. Information packet given to patient/family and safety video watched.  Admission INP armband ID verified with patient/family, and in place. SR up x 2, fall risk assessment complete with Patient and family verbalizing understanding of risks associated with falls. Pt verbalizes an understanding of how to use the call bell and to call for help before getting out of bed.  Will cont to monitor and assist as needed.  [place] N Christian Rana, RN 05/26/2018 4:37 PM

## 2018-05-27 ENCOUNTER — Inpatient Hospital Stay (HOSPITAL_COMMUNITY): Payer: Self-pay

## 2018-05-27 DIAGNOSIS — A419 Sepsis, unspecified organism: Principal | ICD-10-CM

## 2018-05-27 DIAGNOSIS — R6520 Severe sepsis without septic shock: Secondary | ICD-10-CM

## 2018-05-27 DIAGNOSIS — R1310 Dysphagia, unspecified: Secondary | ICD-10-CM

## 2018-05-27 DIAGNOSIS — N179 Acute kidney failure, unspecified: Secondary | ICD-10-CM

## 2018-05-27 DIAGNOSIS — J690 Pneumonitis due to inhalation of food and vomit: Secondary | ICD-10-CM

## 2018-05-27 LAB — URINE CULTURE: Culture: NO GROWTH

## 2018-05-27 LAB — STREP PNEUMONIAE URINARY ANTIGEN: Strep Pneumo Urinary Antigen: NEGATIVE

## 2018-05-27 LAB — CBC
HEMATOCRIT: 34 % — AB (ref 39.0–52.0)
HEMOGLOBIN: 11.4 g/dL — AB (ref 13.0–17.0)
MCH: 30.6 pg (ref 26.0–34.0)
MCHC: 33.5 g/dL (ref 30.0–36.0)
MCV: 91.4 fL (ref 80.0–100.0)
Platelets: 164 10*3/uL (ref 150–400)
RBC: 3.72 MIL/uL — ABNORMAL LOW (ref 4.22–5.81)
RDW: 13 % (ref 11.5–15.5)
WBC: 10 10*3/uL (ref 4.0–10.5)
nRBC: 0 % (ref 0.0–0.2)

## 2018-05-27 LAB — LACTIC ACID, PLASMA: Lactic Acid, Venous: 1 mmol/L (ref 0.5–1.9)

## 2018-05-27 LAB — TSH: TSH: 0.674 u[IU]/mL (ref 0.350–4.500)

## 2018-05-27 NOTE — Progress Notes (Signed)
PROGRESS NOTE        PATIENT DETAILS Name: Christian Ellison Age: 57 y.o. Sex: male Date of Birth: [birth year] Admit Date: 05/26/2018 Admitting Physician Carron Curie, MD [id number], [name], MD  Brief Narrative: Patient is a 57 y.o. male with history of recurrent pneumonias this year-known history of esophageal stricture requiring recent dilatation-presenting to the hospital with sepsis secondary to right lower lobe pneumonia.  Subsequent barium esophagogram's highly suspicious for distal esophageal stricture.  See below for further details.  Subjective: Feels overall better compared to yesterday.  No shortness of breath or chest pain.  Assessment/Plan: Sepsis secondary to right lobar pneumonia: Sepsis pathophysiology has resolved-he is clinically improved.  Discontinue vancomycin-continue cefepime-suspect aspiration may be contributing to recurrent pneumonias.  Await culture data.  Acute metabolic encephalopathy: Secondary to above-resolved-he is awake and alert.  GERD/dysphagia: Claims to have significant reflux symptoms-has had dilatation of a esophageal stricture in August of this year.  Barium esophagogram done earlier this morning shows a possible distal esophageal stricture-GI consulted.  We will keep on clear liquid diet today.  Hypertension: Blood pressure soft-hold antihypertensives for now.  Hypothyroidism: Continue Synthroid  Anxiety: Appears stable-continue with Xanax.  Marijuana use: Counseled.  DVT Prophylaxis: Prophylactic Lovenox   Code Status: Full code   Family Communication: Spouse at bedside  Disposition Plan: Remain inpatient  Antimicrobial agents: Anti-infectives (From admission, onward)   Start     Dose/Rate Route Frequency Ordered Stop   05/27/18 0030  vancomycin (VANCOCIN) IVPB 1000 mg/200 mL premix  Status:  Discontinued     1,000 mg 200 mL/hr over 60 Minutes Intravenous Every 8 hours 05/26/18 1614 05/27/18 0915   05/26/18 2000  ceFEPIme (MAXIPIME) 1 g in sodium chloride 0.9 % 100 mL IVPB     1 g 200 mL/hr over 30 Minutes Intravenous Every 8 hours 05/26/18 1635 06/03/18 1959   05/26/18 1630  vancomycin (VANCOCIN) IVPB 750 mg/150 ml premix     750 mg 150 mL/hr over 60 Minutes Intravenous  Once 05/26/18 1614 05/26/18 2039   05/26/18 1215  vancomycin (VANCOCIN) IVPB 1000 mg/200 mL premix     1,000 mg 200 mL/hr over 60 Minutes Intravenous  Once 05/26/18 1201 05/26/18 1511   05/26/18 1215  ceFEPIme (MAXIPIME) 2 g in sodium chloride 0.9 % 100 mL IVPB     2 g 200 mL/hr over 30 Minutes Intravenous  Once 05/26/18 1201 05/26/18 1316   05/26/18 1200  cefTRIAXone (ROCEPHIN) 2 g in sodium chloride 0.9 % 100 mL IVPB  Status:  Discontinued     2 g 200 mL/hr over 30 Minutes Intravenous Every 24 hours 05/26/18 1148 05/26/18 1200   05/26/18 1200  azithromycin (ZITHROMAX) 500 mg in sodium chloride 0.9 % 250 mL IVPB  Status:  Discontinued     500 mg 250 mL/hr over 60 Minutes Intravenous Every 24 hours 05/26/18 1148 05/26/18 1200      Procedures: None  CONSULTS:  GI  Time spent: 25- minutes-Greater than 50% of this time was spent in counseling, explanation of diagnosis, planning of further management, and coordination of care.  MEDICATIONS: Scheduled Meds: . ALPRAZolam  0.5 mg Oral QHS  . atenolol  25 mg Oral Daily  . enoxaparin (LOVENOX) injection  40 mg Subcutaneous Q24H  . guaiFENesin  600 mg Oral BID  . levothyroxine  25 mcg Oral QAC breakfast  .  mouth rinse  15 mL Mouth Rinse BID  . pantoprazole  40 mg Oral Daily   Continuous Infusions: . ceFEPime (MAXIPIME) IV 1 g (05/27/18 1301)  . dextrose 5 % and 0.45% NaCl 75 mL/hr at 05/27/18 1032   PRN Meds:.acetaminophen **OR** acetaminophen, albuterol, ondansetron **OR** ondansetron (ZOFRAN) IV   PHYSICAL EXAM: Vital signs: Vitals:   05/26/18 2115 05/27/18 0026 05/27/18 0800 05/27/18 0900  BP: (!) 94/54 (!) 99/49  (!) 107/59  Pulse: 72 64  70    Resp: 17 (!) 23  14  Temp:  98 F (36.7 C) 98.2 F (36.8 C)   TempSrc:  Oral Oral   SpO2: 95% 93%  95%  Weight:      Height:       Filed Weights   05/26/18 1127  Weight: 83.9 kg   Body mass index is 25.09 kg/m.   General appearance :Awake, alert, not in any distress. Speech Clear.  Eyes:, pupils equally reactive to light and accomodation,no scleral icterus.Pink conjunctiva HEENT: Atraumatic and Normocephalic Neck: supple Resp:Good air entry bilaterally, rales at right base. CVS: S1 S2 regular, no murmurs.  GI: Bowel sounds present, Non tender and not distended with no gaurding, rigidity or rebound.No organomegaly Extremities: B/L Lower Ext shows no edema, both legs are warm to touch Neurology:  speech clear,Non focal, sensation is grossly intact. Psychiatric: Normal judgment and insight. Alert and oriented x 3. Normal mood. Musculoskeletal:No digital cyanosis Skin:No Rash, warm and dry Wounds:N/A  I have personally reviewed following labs and imaging studies  LABORATORY DATA: CBC: Recent Labs  Lab 05/26/18 1134 05/27/18 0004  WBC 8.0 10.0  NEUTROABS 6.6  --   HGB 14.7 11.4*  HCT 47.4 34.0*  MCV 94.6 91.4  PLT 215 164    Basic Metabolic Panel: Recent Labs  Lab 05/26/18 1134  NA 141  K 3.9  CL 103  CO2 23  GLUCOSE 97  BUN 10  CREATININE 0.78  CALCIUM 9.4    GFR: Estimated Creatinine Clearance: 113.2 mL/min (by C-G formula based on SCr of 0.78 mg/dL).  Liver Function Tests: Recent Labs  Lab 05/26/18 1134  AST 23  ALT 16  ALKPHOS 52  BILITOT 0.8  PROT 7.6  ALBUMIN 4.2   No results for input(s): LIPASE, AMYLASE in the last 168 hours. No results for input(s): AMMONIA in the last 168 hours.  Coagulation Profile: Recent Labs  Lab 05/26/18 1134  INR 1.01    Cardiac Enzymes: No results for input(s): CKTOTAL, CKMB, CKMBINDEX, TROPONINI in the last 168 hours.  BNP (last 3 results) No results for input(s): PROBNP in the last 8760  hours.  HbA1C: No results for input(s): HGBA1C in the last 72 hours.  CBG: No results for input(s): GLUCAP in the last 168 hours.  Lipid Profile: No results for input(s): CHOL, HDL, LDLCALC, TRIG, CHOLHDL, LDLDIRECT in the last 72 hours.  Thyroid Function Tests: Recent Labs    05/26/18 2324  TSH 0.674    Anemia Panel: No results for input(s): VITAMINB12, FOLATE, FERRITIN, TIBC, IRON, RETICCTPCT in the last 72 hours.  Urine analysis:    Component Value Date/Time   COLORURINE YELLOW 05/26/2018 1129   APPEARANCEUR CLEAR 05/26/2018 1129   LABSPEC 1.013 05/26/2018 1129   PHURINE 5.0 05/26/2018 1129   GLUCOSEU NEGATIVE 05/26/2018 1129   HGBUR SMALL (A) 05/26/2018 1129   BILIRUBINUR NEGATIVE 05/26/2018 1129   KETONESUR NEGATIVE 05/26/2018 1129   PROTEINUR NEGATIVE 05/26/2018 1129   NITRITE NEGATIVE 05/26/2018 1129  LEUKOCYTESUR NEGATIVE 05/26/2018 1129    Sepsis Labs: Lactic Acid, Venous    Component Value Date/Time   LATICACIDVEN 1.0 05/27/2018 0124    MICROBIOLOGY: Recent Results (from the past 240 hour(s))  Urine culture     Status: None   Collection Time: 05/26/18  1:18 PM  Result Value Ref Range Status   Specimen Description URINE, CLEAN CATCH  Final   Special Requests NONE  Final   Culture   Final    NO GROWTH Performed at [facility], [address], [place], [place] [address]    Report Status 05/27/2018 FINAL  Final  Respiratory Panel by PCR     Status: None   Collection Time: 05/26/18  4:41 PM  Result Value Ref Range Status   Adenovirus NOT DETECTED NOT DETECTED Final   Coronavirus 229E NOT DETECTED NOT DETECTED Final   Coronavirus HKU1 NOT DETECTED NOT DETECTED Final   Coronavirus NL63 NOT DETECTED NOT DETECTED Final   Coronavirus OC43 NOT DETECTED NOT DETECTED Final   Metapneumovirus NOT DETECTED NOT DETECTED Final   Rhinovirus / Enterovirus NOT DETECTED NOT DETECTED Final   Influenza A NOT DETECTED NOT DETECTED Final   Influenza B NOT  DETECTED NOT DETECTED Final   Parainfluenza Virus 1 NOT DETECTED NOT DETECTED Final   Parainfluenza Virus 2 NOT DETECTED NOT DETECTED Final   Parainfluenza Virus 3 NOT DETECTED NOT DETECTED Final   Parainfluenza Virus 4 NOT DETECTED NOT DETECTED Final   Respiratory Syncytial Virus NOT DETECTED NOT DETECTED Final   Bordetella pertussis NOT DETECTED NOT DETECTED Final   Chlamydophila pneumoniae NOT DETECTED NOT DETECTED Final   Mycoplasma pneumoniae NOT DETECTED NOT DETECTED Final    Comment: Performed at [facility], [address] [place], [place] [address]  MRSA PCR Screening     Status: None   Collection Time: 05/26/18  4:41 PM  Result Value Ref Range Status   MRSA by PCR NEGATIVE NEGATIVE Final    Comment:        The GeneXpert MRSA Assay (FDA approved for NASAL specimens only), is one component of a comprehensive MRSA colonization surveillance program. It is not intended to diagnose MRSA infection nor to guide or monitor treatment for MRSA infections. Performed at [facility], [address], [place], [place] [address]     RADIOLOGY STUDIES/RESULTS: Dg Chest 2 View  Result Date: 05/11/2018 CLINICAL DATA:  Cough. EXAM: CHEST - 2 VIEW COMPARISON:  02/03/2018. FINDINGS: Normal sized heart. Mild diffuse peribronchial thickening. Small amount of increased density in the right mid lung zone on the frontal view, not seen on the lateral view. This appears to be due to overlapping ribs and vessels. Otherwise, clear lungs. The lungs are mildly hyperexpanded. Thoracolumbar spine degenerative changes. IMPRESSION: Mild bronchitic changes and mild changes of COPD. Electronically Signed   By: Beckie Salts M.D.   On: 05/11/2018 09:06   Dg Esophagus  Result Date: 05/27/2018 CLINICAL DATA:  Esophageal dysphagia.  Episodes of pneumonia. EXAM: ESOPHOGRAM/BARIUM SWALLOW TECHNIQUE: Single contrast examination was performed using  thin barium. FLUOROSCOPY TIME:  Fluoroscopy Time:  1  minutes and 48 seconds Radiation Exposure Index (if provided by the fluoroscopic device): 24.9 mGy Number of Acquired Spot Images: 0 COMPARISON:  Prior study 01/06/2018 FINDINGS: Esophageal dysmotility with disruption of the primary peristaltic wave and tertiary contractions. Very little barium would pass into the stomach. Smooth strictured narrowing of the distal esophagus just above the GE junction similar to prior  examination. IMPRESSION: Persistent smooth strictured narrowing of the distal esophagus with very little passage of barium into the stomach. Electronically Signed   By: Rudie Meyer M.D.   On: 05/27/2018 12:01   Dg Chest Port 1 View  Result Date: 05/26/2018 CLINICAL DATA:  Fever, history of pneumonia EXAM: PORTABLE CHEST 1 VIEW COMPARISON:  Chest x-ray of 05/10/2017 and 01/02/2018 FINDINGS: There is now parenchymal opacity at the right lung base most consistent with right basilar pneumonia. Minimally prominent markings are present at the left lung base. No pleural effusion is seen. Heart size is stable. IMPRESSION: Right basilar opacity consistent with pneumonia. Electronically Signed   By: Dwyane Dee M.D.   On: 05/26/2018 12:47     LOS: 1 day   Jeoffrey Massed, MD  [facility]  If 7PM-7AM, please contact night-coverage  Please page via [email or web address] [email or web address]-click on MD name and type text message  05/27/2018, 1:16 PM

## 2018-05-27 NOTE — Consult Note (Signed)
Referring Provider:  Dr. Jerral Ralph, [facility] Primary Care Physician:  Elfredia Nevins, MD Primary Gastroenterologist:  Dr. Karilyn Cota   Reason for Consultation:  Dysphagia and abnormal esophagram  HPI: Christian Ellison is a 57 y.o. male with past medical history significant for HTN, esophageal stricture status post dilatation on 8/19, history of pneumonia, and previous history of polysubstance abuse status post cardiac arrest and return of ROSC in the past (May 2019).  Presented to [facility] ED on 10/10 with 2 week history of increasing shortness of breath.  The patient was treated 10 days ago as an outpatient with Rocephin IM and doxycycline p.o. by his primary medical doctor, however, on the day of admission he was noted at home with frothy sputum and altered mental status.  Patient fianc reports that he has not improved.  No report of fever although he was found to be febrile in the emergency room with temp of 103.3.   Patient has a bedside pulse oximeter at home and his saturation was ranging between 80 and 83%.  In the emergency room the chest x-ray showed right basilar pneumonia with elevated lactic acid at 5 he was started on IV antibiotics and oxygen.   Esophagram performed and showed the following:  IMPRESSION: Persistent smooth strictured narrowing of the distal esophagus with very little passage of barium into the stomach.  Just had EGD by Dr. Karilyn Cota on 03/18/2018  - Normal proximal esophagus and mid esophagus. - Benign-appearing mild esophageal stenosis proximal to GEJ. Dilated. - Z-line irregular, 39 cm from the incisors. - 2 cm hiatal hernia. - Gastritis. Biopsied. - Normal duodenal bulb and second portion of the duodenum.  Was dilated up to 18 mm with balloon dilator.  He admits that maybe the dilation helped slightly.  Initially he had 2-3 weeks where he seemed to do ok but then started with dysphagia again, mostly to liquids actually.  It is intermittent, not daily and not every time he  drinks something.  He says that when he drinks sometimes it gurgles back up in his esophagus and spits up foam.  He admits to laying in bed and eating dinner.  Says that he is taking prilosec and zantac daily.   Past Medical History:  Diagnosis Date  . Anxiety   . Aspiration pneumonia (HCC) 05/26/2018   RLL  . Chronic pain   . GERD (gastroesophageal reflux disease)   . Hepatitis   . Opiate abuse, continuous (HCC)     Past Surgical History:  Procedure Laterality Date  . BIOPSY  03/18/2018   Procedure: BIOPSY;  Surgeon: Malissa Hippo, MD;  [facility];  Service: Endoscopy;;  gastric   . COLONOSCOPY N/A 11/22/2013   Procedure: COLONOSCOPY;  Surgeon: Malissa Hippo, MD;  Location: [facility];  Service: Endoscopy;  Laterality: N/A;  1030  . ESOPHAGEAL DILATION N/A 03/18/2018   Procedure: ESOPHAGEAL DILATION;  Surgeon: Malissa Hippo, MD;  Location: [facility];  Service: Endoscopy;  Laterality: N/A;  . ESOPHAGOGASTRODUODENOSCOPY (EGD) WITH PROPOFOL N/A 03/18/2018   Procedure: ESOPHAGOGASTRODUODENOSCOPY (EGD) WITH PROPOFOL;  Surgeon: Malissa Hippo, MD;  Location: [facility];  Service: Endoscopy;  Laterality: N/A;  8:25  . Fracture Right Leg     Patient has rod/screws in this leg  . Rotor Cuff  2012   Right Shoulder    Prior to Admission medications   Medication Sig Start Date End Date Taking? Authorizing Provider  acetaminophen (TYLENOL) 650 MG CR tablet Take 650  mg by mouth every 8 (eight) hours as needed for pain.   Yes [provider]  ALPRAZolam Prudy Feeler) 0.5 MG tablet Take 0.5 mg by mouth at bedtime.   Yes [provider]  atenolol (TENORMIN) 25 MG tablet Take 25 mg by mouth daily.   Yes [provider]  esomeprazole (NEXIUM) 40 MG capsule Take 1 capsule (40 mg total) by mouth daily. Patient taking differently: Take 40 mg by mouth at bedtime.  02/03/18 02/03/19 Yes Erick Blinks, MD  levothyroxine (SYNTHROID, LEVOTHROID) 25 MCG tablet  Take 25 mcg by mouth daily. 05/13/18  Yes [provider]  Multiple Vitamin (MULTIVITAMIN WITH MINERALS) TABS tablet Take 1 tablet by mouth daily. 50+   Yes [provider]  doxycycline (VIBRA-TABS) 100 MG tablet Take 100 mg by mouth 2 (two) times daily. 05/10/18   [provider]    Current Facility-Administered Medications  Medication Dose Route Frequency Provider Last Rate Last Dose  . acetaminophen (TYLENOL) tablet 650 mg  650 mg Oral Q6H PRN Carron Curie, MD       Or  . acetaminophen (TYLENOL) suppository 650 mg  650 mg Rectal Q6H PRN Carron Curie, MD      . albuterol (PROVENTIL) (2.5 MG/3ML) 0.083% nebulizer solution 2.5 mg  2.5 mg Nebulization Q4H PRN Carron Curie, MD      . ALPRAZolam Prudy Feeler) tablet 0.5 mg  0.5 mg Oral QHS Carron Curie, MD   0.5 mg at 05/26/18 2037  . atenolol (TENORMIN) tablet 25 mg  25 mg Oral Daily Carron Curie, MD   25 mg at 05/27/18 0855  . ceFEPIme (MAXIPIME) 1 g in sodium chloride 0.9 % 100 mL IVPB  1 g Intravenous Q8H Carron Curie, MD 200 mL/hr at 05/27/18 0432 1 g at 05/27/18 0432  . dextrose 5 %-0.45 % sodium chloride infusion   Intravenous Continuous Carron Curie, MD 75 mL/hr at 05/27/18 1032    . enoxaparin (LOVENOX) injection 40 mg  40 mg Subcutaneous Q24H Carron Curie, MD   40 mg at 05/26/18 1745  . guaiFENesin (MUCINEX) 12 hr tablet 600 mg  600 mg Oral BID Carron Curie, MD   600 mg at 05/27/18 0855  . levothyroxine (SYNTHROID, LEVOTHROID) tablet 25 mcg  25 mcg Oral QAC breakfast Carron Curie, MD   25 mcg at 05/27/18 0855  . MEDLINE mouth rinse  15 mL Mouth Rinse BID Carron Curie, MD   15 mL at 05/27/18 0855  . ondansetron (ZOFRAN) tablet 4 mg  4 mg Oral Q6H PRN Carron Curie, MD       Or  . ondansetron (ZOFRAN) injection 4 mg  4 mg Intravenous Q6H PRN Carron Curie, MD      . pantoprazole (PROTONIX) EC tablet 40 mg  40 mg Oral Daily Carron Curie, MD   40 mg at 05/27/18 0855    Allergies as of 05/26/2018  . (No Known Allergies)    Family  History  Problem Relation Age of Onset  . Breast cancer Mother   . Colon cancer Father   . Bladder Cancer Father   . Prostate cancer Father   . Hypertension Sister   . Hypothyroidism Sister   . Healthy Sister   . Healthy Daughter   . Healthy Son   . Healthy Son     Social History   Socioeconomic History  . Marital status: Divorced    Spouse name: Not on file  . Number of children: Not on file  . Years of education:  Not on file  . Highest education level: Not on file  Occupational History  . Not on file  Social Needs  . Financial resource strain: Not on file  . Food insecurity:    Worry: Not on file    Inability: Not on file  . Transportation needs:    Medical: Not on file    Non-medical: Not on file  Tobacco Use  . Smoking status: Never Smoker  . Smokeless tobacco: Never Used  Substance and Sexual Activity  . Alcohol use: No    Comment: Patient states that it has been over a year  . Drug use: Yes    Comment: opiates  . Sexual activity: Yes  Lifestyle  . Physical activity:    Days per week: Not on file    Minutes per session: Not on file  . Stress: Not on file  Relationships  . Social connections:    Talks on phone: Not on file    Gets together: Not on file    Attends religious service: Not on file    Active member of club or organization: Not on file    Attends meetings of clubs or organizations: Not on file    Relationship status: Not on file  . Intimate partner violence:    Fear of current or ex partner: Not on file    Emotionally abused: Not on file    Physically abused: Not on file    Forced sexual activity: Not on file  Other Topics Concern  . Not on file  Social History Narrative  . Not on file    Review of Systems: ROS is O/W negative except as mentioned in HPI.  Physical Exam: Vital signs in last 24 hours: Temp:  [98 F (36.7 C)-101.3 F (38.5 C)] 98.2 F (36.8 C) (10/11 0800) Pulse Rate:  [64-114] 70 (10/11 0900) Resp:  [14-29] 14  (10/11 0900) BP: (94-133)/(49-78) 107/59 (10/11 0900) SpO2:  [90 %-98 %] 95 % (10/11 0900)   General:  Alert, Well-developed, well-nourished, pleasant and cooperative in NAD Head:  Normocephalic and atraumatic. Eyes:  Sclera clear, no icterus.   Conjunctiva pink. Ears:  Normal auditory acuity. Mouth:  No deformity or lesions.   Lungs:  Clear throughout to auscultation.   No wheezes, crackles, or rhonchi.  Heart:  Regular rate and rhythm; no murmurs, clicks, rubs, or gallops. Abdomen:  Soft, non-distended.  BS present.  Non-tender.   Rectal:  Deferred  Msk:  Symmetrical without gross deformities. Pulses:  Normal pulses noted. Extremities:  Without clubbing or edema. Neurologic:  Alert and oriented x 4;  grossly normal neurologically. Skin:  Intact without significant lesions or rashes. Psych:  Alert and cooperative. Normal mood and affect.  Intake/Output from previous day: 10/10 0701 - 10/11 0700 In: 1120.4 [P.O.:120; I.V.:0.4; IV Piggyback:1000] Out: 1200 [Urine:1200] Intake/Output this shift: Total I/O In: 360 [P.O.:360] Out: 300 [Urine:300]  Lab Results: Recent Labs    05/26/18 1134 05/27/18 0004  WBC 8.0 10.0  HGB 14.7 11.4*  HCT 47.4 34.0*  PLT 215 164   BMET Recent Labs    05/26/18 1134  NA 141  K 3.9  CL 103  CO2 23  GLUCOSE 97  BUN 10  CREATININE 0.78  CALCIUM 9.4   LFT Recent Labs    05/26/18 1134  PROT 7.6  ALBUMIN 4.2  AST 23  ALT 16  ALKPHOS 52  BILITOT 0.8   PT/INR Recent Labs    05/26/18 1134  LABPROT 13.2  INR 1.01   Studies/Results: Dg Esophagus  Result Date: 05/27/2018 CLINICAL DATA:  Esophageal dysphagia.  Episodes of pneumonia. EXAM: ESOPHOGRAM/BARIUM SWALLOW TECHNIQUE: Single contrast examination was performed using  thin barium. FLUOROSCOPY TIME:  Fluoroscopy Time:  1 minutes and 48 seconds Radiation Exposure Index (if provided by the fluoroscopic device): 24.9 mGy Number of Acquired Spot Images: 0 COMPARISON:  Prior study  01/06/2018 FINDINGS: Esophageal dysmotility with disruption of the primary peristaltic wave and tertiary contractions. Very little barium would pass into the stomach. Smooth strictured narrowing of the distal esophagus just above the GE junction similar to prior examination. IMPRESSION: Persistent smooth strictured narrowing of the distal esophagus with very little passage of barium into the stomach. Electronically Signed   By: Rudie Meyer M.D.   On: 05/27/2018 12:01   Dg Chest Port 1 View  Result Date: 05/26/2018 CLINICAL DATA:  Fever, history of pneumonia EXAM: PORTABLE CHEST 1 VIEW COMPARISON:  Chest x-ray of 05/10/2017 and 01/02/2018 FINDINGS: There is now parenchymal opacity at the right lung base most consistent with right basilar pneumonia. Minimally prominent markings are present at the left lung base. No pleural effusion is seen. Heart size is stable. IMPRESSION: Right basilar opacity consistent with pneumonia. Electronically Signed   By: Dwyane Dee M.D.   On: 05/26/2018 12:47   IMPRESSION:  *Aspiration PNA with sepsis:  On IV abx. *GERD and dysphagia mostly to liquids:  ? esophageal stricture vs motility issue:  Distal esophageal narrowing demonstrated again on esophagram despite balloon dilation up to 18 mm just in August.  PLAN: *Could re-attempt EGD with dilation once more.  ? Inpatient vs outpatient with Dr. Karilyn Cota,  If no improvement then he would need esophageal manometry.  Need his respiratory status improved first so soonest we would consider would be Sunday.  He is adamant about leaving to go to a family reunion on Sunday so that he can see his dying uncle. *Continue daily PPI therapy.  Princella Pellegrini. Malayshia All  05/27/2018, 12:54 PM

## 2018-05-28 ENCOUNTER — Encounter (HOSPITAL_COMMUNITY): Payer: Self-pay | Admitting: Physician Assistant

## 2018-05-28 DIAGNOSIS — K210 Gastro-esophageal reflux disease with esophagitis: Secondary | ICD-10-CM

## 2018-05-28 LAB — CBC
HEMATOCRIT: 33 % — AB (ref 39.0–52.0)
Hemoglobin: 10.7 g/dL — ABNORMAL LOW (ref 13.0–17.0)
MCH: 29.8 pg (ref 26.0–34.0)
MCHC: 32.4 g/dL (ref 30.0–36.0)
MCV: 91.9 fL (ref 80.0–100.0)
PLATELETS: 142 10*3/uL — AB (ref 150–400)
RBC: 3.59 MIL/uL — ABNORMAL LOW (ref 4.22–5.81)
RDW: 12.4 % (ref 11.5–15.5)
WBC: 5.1 10*3/uL (ref 4.0–10.5)
nRBC: 0 % (ref 0.0–0.2)

## 2018-05-28 LAB — BASIC METABOLIC PANEL
ANION GAP: 6 (ref 5–15)
BUN: 6 mg/dL (ref 6–20)
CO2: 29 mmol/L (ref 22–32)
CREATININE: 0.62 mg/dL (ref 0.61–1.24)
Calcium: 8.5 mg/dL — ABNORMAL LOW (ref 8.9–10.3)
Chloride: 107 mmol/L (ref 98–111)
GFR calc Af Amer: >60 mL/min (ref >60)
GLUCOSE: 108 mg/dL — AB (ref 70–99)
Potassium: 3.4 mmol/L — ABNORMAL LOW (ref 3.5–5.1)
Sodium: 142 mmol/L (ref 135–145)

## 2018-05-28 MED ORDER — ALBUTEROL SULFATE HFA 108 (90 BASE) MCG/ACT IN AERS: 2.0000 | INHALATION_SPRAY | Freq: Four times a day (QID) | RESPIRATORY_TRACT | 0 refills | Status: DC | PRN

## 2018-05-28 MED ORDER — PNEUMOCOCCAL VAC POLYVALENT 25 MCG/0.5ML IJ INJ
0.5000 mL | INJECTION | INTRAMUSCULAR | Status: AC
  Administered 2018-05-28: 0.5 mL via INTRAMUSCULAR
  Filled 2018-05-28: qty 0.5

## 2018-05-28 MED ORDER — POTASSIUM CHLORIDE 20 MEQ PO PACK
40.0000 meq | PACK | Freq: Once | ORAL | Status: AC
  Administered 2018-05-28: 40 meq via ORAL
  Filled 2018-05-28: qty 2

## 2018-05-28 MED ORDER — AMOXICILLIN-POT CLAVULANATE 875-125 MG PO TABS: 1.0000 | ORAL_TABLET | Freq: Two times a day (BID) | ORAL | 0 refills | Status: DC

## 2018-05-28 NOTE — Care Management Note (Signed)
Case Management Note  Patient Details  Name: Christian Ellison MRN: [id number] Date of Birth: November 16, 1960  Subjective/Objective:   From home, for dc today, patient states his pcp is Elfredia Nevins , he will call and make a follow up apt, he does not want to go to [facility].  NCM assisted patient with medications with [employer]. He states he has transportation at [employer].                  Action/Plan: DC home when ready.  Expected Discharge Date:  05/28/18               Expected Discharge Plan:  Home/Self Care  In-House Referral:     Discharge planning Services  CM Consult, MATCH Program, Medication Assistance  Post Acute Care Choice:    Choice offered to:     DME Arranged:    DME Agency:     HH Arranged:    HH Agency:     Status of Service:  Completed, signed off  If discussed at [employer], dates discussed:    Additional Comments:  Leone Haven, RN 05/28/2018, 12:01 PM

## 2018-05-28 NOTE — Progress Notes (Signed)
Daily Rounding Note  05/28/2018, 10:50 AM  LOS: 2 days   SUBJECTIVE:   Chief complaint: Dysphagia.  Tolerating clear liquids. Abdominal pain, no nausea.  OBJECTIVE:         Vital signs in last 24 hours:    Temp:  [97.7 F (36.5 C)-98.5 F (36.9 C)] 97.7 F (36.5 C) (10/12 0508) Pulse Rate:  [63-66] 63 (10/12 0508) Resp:  [19] 19 (10/12 0508) BP: (118-128)/(61-66) 128/66 (10/12 0933) SpO2:  [96 %-100 %] 96 % (10/12 1018)   Filed Weights   05/26/18 1127  Weight: 83.9 kg   General: Drowsy but arousable.  Comfortable.  Does not look toxic. Heart: RRR. Chest: Clear bilaterally.  No labored breathing or cough. Abdomen: Soft.  Not tender or distended.  Active bowel sounds. Extremities: No CCE. Neuro/Psych: Oriented x3.  Moves all 4s.   No tremors or limb weakness.  Intake/Output from previous day: 10/11 0701 - 10/12 0700 In: 3426 [P.O.:840; I.V.:2186; IV Piggyback:400] Out: 1200 [Urine:1200]  Intake/Output this shift: Total I/O In: 240 [P.O.:240] Out: -   Lab Results: Recent Labs    05/26/18 1134 05/27/18 0004 05/28/18 0330  WBC 8.0 10.0 5.1  HGB 14.7 11.4* 10.7*  HCT 47.4 34.0* 33.0*  PLT 215 164 142*   BMET Recent Labs    05/26/18 1134 05/28/18 0330  NA 141 142  K 3.9 3.4*  CL 103 107  CO2 23 29  GLUCOSE 97 108*  BUN 10 6  CREATININE 0.78 0.62  CALCIUM 9.4 8.5*   LFT Recent Labs    05/26/18 1134  PROT 7.6  ALBUMIN 4.2  AST 23  ALT 16  ALKPHOS 52  BILITOT 0.8   PT/INR Recent Labs    05/26/18 1134  LABPROT 13.2  INR 1.01   Hepatitis Panel No results for input(s): HEPBSAG, HCVAB, HEPAIGM, HEPBIGM in the last 72 hours.  Studies/Results: Dg Esophagus  Result Date: 05/27/2018 CLINICAL DATA:  Esophageal dysphagia.  Episodes of pneumonia. EXAM: ESOPHOGRAM/BARIUM SWALLOW TECHNIQUE: Single contrast examination was performed using  thin barium. FLUOROSCOPY TIME:  Fluoroscopy Time:   1 minutes and 48 seconds Radiation Exposure Index (if provided by the fluoroscopic device): 24.9 mGy Number of Acquired Spot Images: 0 COMPARISON:  Prior study 01/06/2018 FINDINGS: Esophageal dysmotility with disruption of the primary peristaltic wave and tertiary contractions. Very little barium would pass into the stomach. Smooth strictured narrowing of the distal esophagus just above the GE junction similar to prior examination. IMPRESSION: Persistent smooth strictured narrowing of the distal esophagus with very little passage of barium into the stomach. Electronically Signed   By: Rudie Meyer M.D.   On: 05/27/2018 12:01   Dg Chest Port 1 View  Result Date: 05/26/2018 CLINICAL DATA:  Fever, history of pneumonia EXAM: PORTABLE CHEST 1 VIEW COMPARISON:  Chest x-ray of 05/10/2017 and 01/02/2018 FINDINGS: There is now parenchymal opacity at the right lung base most consistent with right basilar pneumonia. Minimally prominent markings are present at the left lung base. No pleural effusion is seen. Heart size is stable. IMPRESSION: Right basilar opacity consistent with pneumonia. Electronically Signed   By: Dwyane Dee M.D.   On: 05/26/2018 12:47   Scheduled Meds: . ALPRAZolam  0.5 mg Oral QHS  . enoxaparin (LOVENOX) injection  40 mg Subcutaneous Q24H  . guaiFENesin  600 mg Oral BID  . levothyroxine  25 mcg Oral QAC breakfast  . mouth rinse  15 mL Mouth Rinse BID  .  pantoprazole  40 mg Oral Daily   Continuous Infusions: . ceFEPime (MAXIPIME) IV 1 g (05/28/18 0335)  . dextrose 5 % and 0.45% NaCl 75 mL/hr at 05/28/18 0000   PRN Meds:.acetaminophen **OR** acetaminophen, albuterol, ondansetron **OR** ondansetron (ZOFRAN) IV   ASSESMENT:   *   Liquid dysphagia, GERD. Esophagram demonstrates distal esophageal narrowing despite balloon dilatation of esophagus in August 2019.  Pt felt no improvement of swallow after dilt.    Patient dysphagia due to esophageal stricture versus motility.  *     Aspiration pneumonia. Cefepime in place.  *    Normocytic anemia.  *     Noncritical thrombocytopenia.    *    History acute hepatitis in May 2019.  Hep C positive.  Ultrasound at that time unremarkable, no evidence of liver, biliary ductal or gallbladder disease.Marland Kitchen   PLAN   *   No plans for inpatient EGD. Should follow-up with Dr. Karilyn Cota can decide whether or not to pursue repeat EGD and dilation and/or pursue esophageal manometry. At some point should be referred for assessment as to treating his hep C.  *   Advance to [facility] diet, stay on this after discharge.     *    From GI standpoint he is okay to go home.  Stay on daily Nexium which she was taking as an outpatient.    Jennye Moccasin  05/28/2018, 10:50 AM Phone [phone]

## 2018-05-28 NOTE — Discharge Summary (Addendum)
PATIENT DETAILS Name: Christian Ellison Age: 57 y.o. Sex: male Date of Birth: [birth year] MRN: [id number]. Admitting Physician: Carron Curie, MD [id number], [name], MD  Admit Date: 05/26/2018 Discharge date: 05/28/2018  Recommendations for Outpatient Follow-up:  1. Follow up with PCP in 1-2 weeks 2. Please obtain BMP/CBC in one week 3. Please ensure follow up with Dr. Higinio Roger 4. Follow blood cultures until final 5. Repeat 2 view chest x-ray in 4 to 6 weeks.  Admitted From:  Home  Disposition: Home   Home Health: No  Equipment/Devices: None  Discharge Condition: Stable  CODE STATUS: FULL CODE  Diet recommendation:  Heart Healthy-but stay on a clear liquid/full liquid diet until seen by GI MD.  Brief Summary: See H&P, Labs, Consult and Test reports for all details in brief, Patient is a 57 y.o. male with history of recurrent pneumonias this year-known history of esophageal stricture requiring recent dilatation-presenting to the hospital with sepsis secondary to right lower lobe pneumonia.  Subsequent barium esophagogram's highly suspicious for distal esophageal stricture.  See below for further details.  Brief Hospital Course: Sepsis secondary to right lobar pneumonia: Sepsis pathophysiology has rapidly resolved, he is significantly clinically improved.  He is completely awake and alert.  He was started on vancomycin and cefepime initially, high suspicion for aspiration pneumonia due to GERD/esophageal stricture.  Will transition to Augmentin on discharge.  Patient claims he has a family reunion-his uncle is dying and has only a few days left-and would like to be discharged.  Please follow blood cultures until final-however cultures are negative so far.  Acute metabolic encephalopathy: Secondary to above-resolved-he is awake and alert.  GERD/dysphagia: Claims to have significant reflux symptoms-has had dilatation of a esophageal stricture in August of this year.   Barium esophagogram done on 10/11 showed possible distal esophageal stricture-evaluated by GI-recommendations are to pursue outpatient EGD and manometry.  Continue PPI on discharge-patient will be given GERD/reflux precautions printed instructions on discharge-he has been counseled to avoid smoking/marijuana use.  He has been instructed to follow with outpatient GI MD early next week  Hypertension: Blood pressure better-resume antihypertensives.  Hypothyroidism: Continue Synthroid  Anxiety: Appears stable-continue with Xanax.  Marijuana use: Counseled extensively against further use.  Procedures/Studies: None  Discharge Diagnoses:  Active Problems:   Sepsis (HCC)   Aspiration pneumonia of right lower lobe [facility])  Discharge Instructions:  Activity:  As tolerated with Full fall precautions use walker/cane & assistance as needed   Discharge Instructions    Call MD for:  difficulty breathing, headache or visual disturbances   Complete by:  As directed    Call MD for:  persistant nausea and vomiting   Complete by:  As directed    Call MD for:  severe uncontrolled pain   Complete by:  As directed    Call MD for:  temperature >100.4   Complete by:  As directed    Diet general   Complete by:  As directed    Stay on a liquid diet until seen by Dr. Minus Liberty   Discharge instructions   Complete by:  As directed    Follow with Primary MD  Elfredia Nevins, MD in 1 week  Please get in touch with Dr Queen [place] office on Monday- to arrange a EGD/Manometry early next week.  Stay on a clear liquid/full liquid diet until seen by Dr Minus Liberty.  AVOID USE OF MARIJUANA  Please get a complete blood count and chemistry panel checked by your Primary MD at your next  visit, and again as instructed by your Primary MD.  Get Medicines reviewed and adjusted: Please take all your medications with you for your next visit with your Primary MD  Laboratory/radiological data: Please request your Primary MD  to go over all hospital tests and procedure/radiological results at the follow up, please ask your Primary MD to get all Hospital records sent to his/her office.  In some cases, they will be blood work, cultures and biopsy results pending at the time of your discharge. Please request that your primary care M.D. follows up on these results.  Also Note the following: If you experience worsening of your admission symptoms, develop shortness of breath, life threatening emergency, suicidal or homicidal thoughts you must seek medical attention immediately by calling 911 or calling your MD immediately  if symptoms less severe.  You must read complete instructions/literature along with all the possible adverse reactions/side effects for all the Medicines you take and that have been prescribed to you. Take any new Medicines after you have completely understood and accpet all the possible adverse reactions/side effects.   Do not drive when taking Pain medications or sleeping medications (Benzodaizepines)  Do not take more than prescribed Pain, Sleep and Anxiety Medications. It is not advisable to combine anxiety,sleep and pain medications without talking with your primary care practitioner  Special Instructions: If you have smoked or chewed Tobacco  in the last 2 yrs please stop smoking, stop any regular Alcohol  and or any Recreational drug use.  Wear Seat belts while driving.  Please note: You were cared for by a hospitalist during your hospital stay. Once you are discharged, your primary care physician will handle any further medical issues. Please note that NO REFILLS for any discharge medications will be authorized once you are discharged, as it is imperative that you return to your primary care physician (or establish a relationship with a primary care physician if you do not have one) for your post hospital discharge needs so that they can reassess your need for medications and monitor your lab  values.   Increase activity slowly   Complete by:  As directed      Allergies as of 05/28/2018   No Known Allergies     Medication List    STOP taking these medications   doxycycline 100 MG tablet Commonly known as:  VIBRA-TABS     TAKE these medications   acetaminophen 650 MG CR tablet Commonly known as:  TYLENOL Take 650 mg by mouth every 8 (eight) hours as needed for pain.   albuterol 108 (90 Base) MCG/ACT inhaler Commonly known as:  PROVENTIL HFA;VENTOLIN HFA Inhale 2 puffs into the lungs every 6 (six) hours as needed for wheezing or shortness of breath.   ALPRAZolam 0.5 MG tablet Commonly known as:  XANAX Take 0.5 mg by mouth at bedtime.   amoxicillin-clavulanate 875-125 MG tablet Commonly known as:  AUGMENTIN Take 1 tablet by mouth 2 (two) times daily.   atenolol 25 MG tablet Commonly known as:  TENORMIN Take 25 mg by mouth daily.   esomeprazole 40 MG capsule Commonly known as:  NEXIUM Take 1 capsule (40 mg total) by mouth daily. What changed:  when to take this   levothyroxine 25 MCG tablet Commonly known as:  SYNTHROID, LEVOTHROID Take 25 mcg by mouth daily.   multivitamin with minerals Tabs tablet Take 1 tablet by mouth daily. 50+      Follow-up Information    Elfredia Nevins, MD Follow up.  Specialty:  Internal Medicine Why:  call and make a hospital follow up apt on Monday Contact information: [address] [place] [address] [phone]          No Known Allergies  Consultations:   GI  Other Procedures/Studies: Dg Chest 2 View  Result Date: 05/11/2018 CLINICAL DATA:  Cough. EXAM: CHEST - 2 VIEW COMPARISON:  02/03/2018. FINDINGS: Normal sized heart. Mild diffuse peribronchial thickening. Small amount of increased density in the right mid lung zone on the frontal view, not seen on the lateral view. This appears to be due to overlapping ribs and vessels. Otherwise, clear lungs. The lungs are mildly hyperexpanded.  Thoracolumbar spine degenerative changes. IMPRESSION: Mild bronchitic changes and mild changes of COPD. Electronically Signed   By: Beckie Salts M.D.   On: 05/11/2018 09:06   Dg Esophagus  Result Date: 05/27/2018 CLINICAL DATA:  Esophageal dysphagia.  Episodes of pneumonia. EXAM: ESOPHOGRAM/BARIUM SWALLOW TECHNIQUE: Single contrast examination was performed using  thin barium. FLUOROSCOPY TIME:  Fluoroscopy Time:  1 minutes and 48 seconds Radiation Exposure Index (if provided by the fluoroscopic device): 24.9 mGy Number of Acquired Spot Images: 0 COMPARISON:  Prior study 01/06/2018 FINDINGS: Esophageal dysmotility with disruption of the primary peristaltic wave and tertiary contractions. Very little barium would pass into the stomach. Smooth strictured narrowing of the distal esophagus just above the GE junction similar to prior examination. IMPRESSION: Persistent smooth strictured narrowing of the distal esophagus with very little passage of barium into the stomach. Electronically Signed   By: Rudie Meyer M.D.   On: 05/27/2018 12:01   Dg Chest Port 1 View  Result Date: 05/26/2018 CLINICAL DATA:  Fever, history of pneumonia EXAM: PORTABLE CHEST 1 VIEW COMPARISON:  Chest x-ray of 05/10/2017 and 01/02/2018 FINDINGS: There is now parenchymal opacity at the right lung base most consistent with right basilar pneumonia. Minimally prominent markings are present at the left lung base. No pleural effusion is seen. Heart size is stable. IMPRESSION: Right basilar opacity consistent with pneumonia. Electronically Signed   By: Dwyane Dee M.D.   On: 05/26/2018 12:47     TODAY-DAY OF DISCHARGE:  Subjective:   Christian Ellison today has no headache,no chest abdominal pain,no new weakness tingling or numbness, feels much better wants to go home today.   Objective:   Blood pressure 128/66, pulse 63, temperature 97.7 F (36.5 C), resp. rate 19, height 6' (1.829 m), weight 83.9 kg, SpO2 96 %.  Intake/Output  Summary (Last 24 hours) at 05/28/2018 1255 Last data filed at 05/28/2018 1017 Gross per 24 hour  Intake 3896.5 ml  Output 900 ml  Net 2996.5 ml   Filed Weights   05/26/18 1127  Weight: 83.9 kg    Exam: Awake Alert, Oriented *3, No new F.N deficits, Normal affect [place].AT,PERRAL Supple Neck,No JVD, No cervical lymphadenopathy appriciated.  Symmetrical Chest wall movement, Good air movement bilaterally, CTAB RRR,No Gallops,Rubs or new Murmurs, No Parasternal Heave +ve B.Sounds, Abd Soft, Non tender, No organomegaly appriciated, No rebound -guarding or rigidity. No Cyanosis, Clubbing or edema, No new Rash or bruise   PERTINENT RADIOLOGIC STUDIES: Dg Chest 2 View  Result Date: 05/11/2018 CLINICAL DATA:  Cough. EXAM: CHEST - 2 VIEW COMPARISON:  02/03/2018. FINDINGS: Normal sized heart. Mild diffuse peribronchial thickening. Small amount of increased density in the right mid lung zone on the frontal view, not seen on the lateral view. This appears to be due to overlapping ribs and vessels. Otherwise, clear lungs. The lungs are mildly  hyperexpanded. Thoracolumbar spine degenerative changes. IMPRESSION: Mild bronchitic changes and mild changes of COPD. Electronically Signed   By: Beckie Salts M.D.   On: 05/11/2018 09:06   Dg Esophagus  Result Date: 05/27/2018 CLINICAL DATA:  Esophageal dysphagia.  Episodes of pneumonia. EXAM: ESOPHOGRAM/BARIUM SWALLOW TECHNIQUE: Single contrast examination was performed using  thin barium. FLUOROSCOPY TIME:  Fluoroscopy Time:  1 minutes and 48 seconds Radiation Exposure Index (if provided by the fluoroscopic device): 24.9 mGy Number of Acquired Spot Images: 0 COMPARISON:  Prior study 01/06/2018 FINDINGS: Esophageal dysmotility with disruption of the primary peristaltic wave and tertiary contractions. Very little barium would pass into the stomach. Smooth strictured narrowing of the distal esophagus just above the GE junction similar to prior examination.  IMPRESSION: Persistent smooth strictured narrowing of the distal esophagus with very little passage of barium into the stomach. Electronically Signed   By: Rudie Meyer M.D.   On: 05/27/2018 12:01   Dg Chest Port 1 View  Result Date: 05/26/2018 CLINICAL DATA:  Fever, history of pneumonia EXAM: PORTABLE CHEST 1 VIEW COMPARISON:  Chest x-ray of 05/10/2017 and 01/02/2018 FINDINGS: There is now parenchymal opacity at the right lung base most consistent with right basilar pneumonia. Minimally prominent markings are present at the left lung base. No pleural effusion is seen. Heart size is stable. IMPRESSION: Right basilar opacity consistent with pneumonia. Electronically Signed   By: Dwyane Dee M.D.   On: 05/26/2018 12:47     PERTINENT LAB RESULTS: CBC: Recent Labs    05/27/18 0004 05/28/18 0330  WBC 10.0 5.1  HGB 11.4* 10.7*  HCT 34.0* 33.0*  PLT 164 142*   CMET CMP     Component Value Date/Time   NA 142 05/28/2018 0330   K 3.4 (L) 05/28/2018 0330   CL 107 05/28/2018 0330   CO2 29 05/28/2018 0330   GLUCOSE 108 (H) 05/28/2018 0330   BUN 6 05/28/2018 0330   CREATININE 0.62 05/28/2018 0330   CALCIUM 8.5 (L) 05/28/2018 0330   PROT 7.6 05/26/2018 1134   ALBUMIN 4.2 05/26/2018 1134   AST 23 05/26/2018 1134   ALT 16 05/26/2018 1134   ALKPHOS 52 05/26/2018 1134   BILITOT 0.8 05/26/2018 1134   GFRNONAA >60 05/28/2018 0330   GFRAA >60 05/28/2018 0330    GFR Estimated Creatinine Clearance: 113.2 mL/min (by C-G formula based on SCr of 0.62 mg/dL). No results for input(s): LIPASE, AMYLASE in the last 72 hours. No results for input(s): CKTOTAL, CKMB, CKMBINDEX, TROPONINI in the last 72 hours. Invalid input(s): POCBNP No results for input(s): DDIMER in the last 72 hours. No results for input(s): HGBA1C in the last 72 hours. No results for input(s): CHOL, HDL, LDLCALC, TRIG, CHOLHDL, LDLDIRECT in the last 72 hours. Recent Labs    05/26/18 2324  TSH 0.674   No results for input(s):  VITAMINB12, FOLATE, FERRITIN, TIBC, IRON, RETICCTPCT in the last 72 hours. Coags: Recent Labs    05/26/18 1134  INR 1.01   Microbiology: Recent Results (from the past 240 hour(s))  Culture, blood (Routine x 2)     Status: None (Preliminary result)   Collection Time: 05/26/18 11:33 AM  Result Value Ref Range Status   Specimen Description BLOOD LEFT HAND  Final   Special Requests   Final    BOTTLES DRAWN AEROBIC AND ANAEROBIC Blood Culture results may not be optimal due to an inadequate volume of blood received in culture bottles   Culture   Final    NO  GROWTH 2 DAYS Performed at [facility], [address], [place], [place] [address]    Report Status PENDING  Incomplete  Culture, blood (Routine x 2)     Status: None (Preliminary result)   Collection Time: 05/26/18 12:00 PM  Result Value Ref Range Status   Specimen Description BLOOD LEFT HAND  Final   Special Requests   Final    BOTTLES DRAWN AEROBIC AND ANAEROBIC Blood Culture results may not be optimal due to an inadequate volume of blood received in culture bottles   Culture   Final    NO GROWTH 2 DAYS Performed at [facility], [address] [place], [place] [address]    Report Status PENDING  Incomplete  Urine culture     Status: None   Collection Time: 05/26/18  1:18 PM  Result Value Ref Range Status   Specimen Description URINE, CLEAN CATCH  Final   Special Requests NONE  Final   Culture   Final    NO GROWTH Performed at [facility], [address], [place], [place] [address]    Report Status 05/27/2018 FINAL  Final  Respiratory Panel by PCR     Status: None   Collection Time: 05/26/18  4:41 PM  Result Value Ref Range Status   Adenovirus NOT DETECTED NOT DETECTED Final   Coronavirus 229E NOT DETECTED NOT DETECTED Final   Coronavirus HKU1 NOT DETECTED NOT DETECTED Final   Coronavirus NL63 NOT DETECTED NOT DETECTED Final   Coronavirus OC43 NOT DETECTED NOT DETECTED Final   Metapneumovirus NOT  DETECTED NOT DETECTED Final   Rhinovirus / Enterovirus NOT DETECTED NOT DETECTED Final   Influenza A NOT DETECTED NOT DETECTED Final   Influenza B NOT DETECTED NOT DETECTED Final   Parainfluenza Virus 1 NOT DETECTED NOT DETECTED Final   Parainfluenza Virus 2 NOT DETECTED NOT DETECTED Final   Parainfluenza Virus 3 NOT DETECTED NOT DETECTED Final   Parainfluenza Virus 4 NOT DETECTED NOT DETECTED Final   Respiratory Syncytial Virus NOT DETECTED NOT DETECTED Final   Bordetella pertussis NOT DETECTED NOT DETECTED Final   Chlamydophila pneumoniae NOT DETECTED NOT DETECTED Final   Mycoplasma pneumoniae NOT DETECTED NOT DETECTED Final    Comment: Performed at [facility], [address] [place], [place] [address]  MRSA PCR Screening     Status: None   Collection Time: 05/26/18  4:41 PM  Result Value Ref Range Status   MRSA by PCR NEGATIVE NEGATIVE Final    Comment:        The GeneXpert MRSA Assay (FDA approved for NASAL specimens only), is one component of a comprehensive MRSA colonization surveillance program. It is not intended to diagnose MRSA infection nor to guide or monitor treatment for MRSA infections. Performed at [facility], [address] [place], [place] [address]     FURTHER DISCHARGE INSTRUCTIONS:  Get Medicines reviewed and adjusted: Please take all your medications with you for your next visit with your Primary MD  Laboratory/radiological data: Please request your Primary MD to go over all hospital tests and procedure/radiological results at the follow up, please ask your Primary MD to get all Hospital records sent to his/her office.  In some cases, they will be blood work, cultures and biopsy results pending at the time of your discharge. Please request that your primary care M.D. goes through all the records of your hospital data and follows up on these results.  Also Note  the following: If you experience worsening of your admission symptoms,  develop shortness of breath, life threatening emergency, suicidal or homicidal thoughts you must seek medical attention immediately by calling 911 or calling your MD immediately  if symptoms less severe.  You must read complete instructions/literature along with all the possible adverse reactions/side effects for all the Medicines you take and that have been prescribed to you. Take any new Medicines after you have completely understood and accpet all the possible adverse reactions/side effects.   Do not drive when taking Pain medications or sleeping medications (Benzodaizepines)  Do not take more than prescribed Pain, Sleep and Anxiety Medications. It is not advisable to combine anxiety,sleep and pain medications without talking with your primary care practitioner  Special Instructions: If you have smoked or chewed Tobacco  in the last 2 yrs please stop smoking, stop any regular Alcohol  and or any Recreational drug use.  Wear Seat belts while driving.  Please note: You were cared for by a hospitalist during your hospital stay. Once you are discharged, your primary care physician will handle any further medical issues. Please note that NO REFILLS for any discharge medications will be authorized once you are discharged, as it is imperative that you return to your primary care physician (or establish a relationship with a primary care physician if you do not have one) for your post hospital discharge needs so that they can reassess your need for medications and monitor your lab values.  Total Time spent coordinating discharge including counseling, education and face to face time equals 35  minutes.  SignedJeoffrey Massed 05/28/2018 12:55 PM

## 2018-05-28 NOTE — Discharge Instructions (Signed)

## 2018-05-28 NOTE — Progress Notes (Signed)
Nsg Discharge Note  Admit Date:  05/26/2018 Discharge date: 05/28/2018   [name] Ezzell to be D/C'd home per MD order.  AVS completed.  Given to patient along with discharge instructions and printed prescriptions.   Discharge Medication: Allergies as of 05/28/2018   No Known Allergies     Medication List    STOP taking these medications   doxycycline 100 MG tablet Commonly known as:  VIBRA-TABS     TAKE these medications   acetaminophen 650 MG CR tablet Commonly known as:  TYLENOL Take 650 mg by mouth every 8 (eight) hours as needed for pain.   albuterol 108 (90 Base) MCG/ACT inhaler Commonly known as:  PROVENTIL HFA;VENTOLIN HFA Inhale 2 puffs into the lungs every 6 (six) hours as needed for wheezing or shortness of breath.   ALPRAZolam 0.5 MG tablet Commonly known as:  XANAX Take 0.5 mg by mouth at bedtime.   amoxicillin-clavulanate 875-125 MG tablet Commonly known as:  AUGMENTIN Take 1 tablet by mouth 2 (two) times daily.   atenolol 25 MG tablet Commonly known as:  TENORMIN Take 25 mg by mouth daily.   esomeprazole 40 MG capsule Commonly known as:  NEXIUM Take 1 capsule (40 mg total) by mouth daily. What changed:  when to take this   levothyroxine 25 MCG tablet Commonly known as:  SYNTHROID, LEVOTHROID Take 25 mcg by mouth daily.   multivitamin with minerals Tabs tablet Take 1 tablet by mouth daily. 50+       Discharge Assessment: Vitals:   05/28/18 0933 05/28/18 1018  BP: 128/66   Pulse:    Resp:    Temp:    SpO2:  96%   Skin clean, dry and intact without evidence of skin break down, no evidence of skin tears noted. IV catheters discontinued intact. Site without signs and symptoms of complications.  Dressing with slight pressure applied.  D/c Instructions-Education: Discharge instructions given to patient/family with verbalized understanding. D/c education completed with patient/family including follow up instructions, medication list, d/c  activities limitations if indicated, with other d/c instructions as indicated by MD - patient able to verbalize understanding, all questions fully answered. Patient instructed to return to ED, call 911, or call MD for any changes in condition.  Patient left unit with wife and declined ride to car in wheelchair.   Melina Modena, RN 05/28/2018 1:59 PM

## 2018-05-28 NOTE — Progress Notes (Signed)
Since yesterday-patient has been asking to go home for a family event tomorrow-apparently his uncle is dying and has only a few days left.  This morning he feels significantly improved-lungs are completely clear-he has been evaluated by gastroenterology-I personally spoke with Dr. Orvan Falconer today-no need to pursue EGD today-okay to follow-up with his primary gastroenterologist Dr. Minus Liberty early next week for further work-up.  I subsequently spoke with the patient and his fiance at bedside-since he feels better-he wants to go home, will switch him over to Augmentin and have him follow-up with his primary GI MD early next week.  Patient claims that his primary GI MD's office is only 10 minutes from his house.  We will discharge patient-see discharge summary for further details.

## 2018-05-31 ENCOUNTER — Telehealth (INDEPENDENT_AMBULATORY_CARE_PROVIDER_SITE_OTHER): Payer: Self-pay | Admitting: *Deleted

## 2018-05-31 LAB — CULTURE, BLOOD (ROUTINE X 2)
CULTURE: NO GROWTH
CULTURE: NO GROWTH

## 2018-05-31 NOTE — Telephone Encounter (Signed)
Order sent to [facility] for manometry/impedence study

## 2018-05-31 NOTE — Telephone Encounter (Signed)
Patient was seen at [facility]. The physician ask that he call our offide and make appointment for EGD/Manometry early this week. The Barium Esophagram done during this hospitalization and the EGD that Dr.Rehman did in August were reviewed by Dr.Rehman. He ask that the patient be set up for Manometry and Impedance study.  Patient called and made aware.  Forwarded to [facility] to arrange.

## 2018-06-09 ENCOUNTER — Emergency Department (HOSPITAL_COMMUNITY): Payer: Self-pay

## 2018-06-09 ENCOUNTER — Inpatient Hospital Stay (HOSPITAL_COMMUNITY)
Admission: EM | Admit: 2018-06-09 | Discharge: 2018-06-11 | DRG: 871 | Disposition: A | Discharge status: Home / Self Care | Payer: Self-pay | Attending: Family Medicine | Admitting: Family Medicine

## 2018-06-09 ENCOUNTER — Other Ambulatory Visit: Payer: Self-pay

## 2018-06-09 ENCOUNTER — Encounter (HOSPITAL_COMMUNITY): Payer: Self-pay | Admitting: *Deleted

## 2018-06-09 DIAGNOSIS — R1310 Dysphagia, unspecified: Secondary | ICD-10-CM

## 2018-06-09 DIAGNOSIS — F411 Generalized anxiety disorder: Secondary | ICD-10-CM | POA: Diagnosis present

## 2018-06-09 DIAGNOSIS — G8929 Other chronic pain: Secondary | ICD-10-CM | POA: Diagnosis present

## 2018-06-09 DIAGNOSIS — K224 Dyskinesia of esophagus: Secondary | ICD-10-CM

## 2018-06-09 DIAGNOSIS — Z8042 Family history of malignant neoplasm of prostate: Secondary | ICD-10-CM

## 2018-06-09 DIAGNOSIS — K222 Esophageal obstruction: Secondary | ICD-10-CM | POA: Diagnosis present

## 2018-06-09 DIAGNOSIS — Z8052 Family history of malignant neoplasm of bladder: Secondary | ICD-10-CM

## 2018-06-09 DIAGNOSIS — Z803 Family history of malignant neoplasm of breast: Secondary | ICD-10-CM

## 2018-06-09 DIAGNOSIS — Z79890 Hormone replacement therapy (postmenopausal): Secondary | ICD-10-CM

## 2018-06-09 DIAGNOSIS — F419 Anxiety disorder, unspecified: Secondary | ICD-10-CM | POA: Diagnosis present

## 2018-06-09 DIAGNOSIS — Z800 Family history of malignant neoplasm of digestive organs: Secondary | ICD-10-CM

## 2018-06-09 DIAGNOSIS — R1314 Dysphagia, pharyngoesophageal phase: Secondary | ICD-10-CM | POA: Diagnosis present

## 2018-06-09 DIAGNOSIS — K649 Unspecified hemorrhoids: Secondary | ICD-10-CM | POA: Diagnosis present

## 2018-06-09 DIAGNOSIS — K449 Diaphragmatic hernia without obstruction or gangrene: Secondary | ICD-10-CM | POA: Diagnosis present

## 2018-06-09 DIAGNOSIS — A419 Sepsis, unspecified organism: Principal | ICD-10-CM | POA: Diagnosis present

## 2018-06-09 DIAGNOSIS — R0902 Hypoxemia: Secondary | ICD-10-CM | POA: Diagnosis present

## 2018-06-09 DIAGNOSIS — J9601 Acute respiratory failure with hypoxia: Secondary | ICD-10-CM | POA: Diagnosis present

## 2018-06-09 DIAGNOSIS — Z8701 Personal history of pneumonia (recurrent): Secondary | ICD-10-CM

## 2018-06-09 DIAGNOSIS — I959 Hypotension, unspecified: Secondary | ICD-10-CM | POA: Diagnosis present

## 2018-06-09 DIAGNOSIS — E039 Hypothyroidism, unspecified: Secondary | ICD-10-CM | POA: Diagnosis present

## 2018-06-09 DIAGNOSIS — J690 Pneumonitis due to inhalation of food and vomit: Secondary | ICD-10-CM

## 2018-06-09 DIAGNOSIS — K219 Gastro-esophageal reflux disease without esophagitis: Secondary | ICD-10-CM | POA: Diagnosis present

## 2018-06-09 DIAGNOSIS — R509 Fever, unspecified: Secondary | ICD-10-CM | POA: Diagnosis present

## 2018-06-09 DIAGNOSIS — Z8249 Family history of ischemic heart disease and other diseases of the circulatory system: Secondary | ICD-10-CM

## 2018-06-09 DIAGNOSIS — F1110 Opioid abuse, uncomplicated: Secondary | ICD-10-CM | POA: Diagnosis present

## 2018-06-09 DIAGNOSIS — R000 Tachycardia, unspecified: Secondary | ICD-10-CM | POA: Diagnosis present

## 2018-06-09 DIAGNOSIS — R6520 Severe sepsis without septic shock: Secondary | ICD-10-CM

## 2018-06-09 DIAGNOSIS — I10 Essential (primary) hypertension: Secondary | ICD-10-CM | POA: Diagnosis present

## 2018-06-09 DIAGNOSIS — B1920 Unspecified viral hepatitis C without hepatic coma: Secondary | ICD-10-CM | POA: Diagnosis present

## 2018-06-09 LAB — BLOOD GAS, ARTERIAL
Acid-Base Excess: 2.4 mmol/L — ABNORMAL HIGH (ref 0.0–2.0)
Bicarbonate: 25.9 mmol/L (ref 20.0–28.0)
Drawn by: 21310
O2 Content: 3 L/min
O2 SAT: 92.5 %
PATIENT TEMPERATURE: 38.1
pCO2 arterial: 52.9 mmHg — ABNORMAL HIGH (ref 32.0–48.0)
pH, Arterial: 7.34 — ABNORMAL LOW (ref 7.350–7.450)
pO2, Arterial: 72.6 mmHg — ABNORMAL LOW (ref 83.0–108.0)

## 2018-06-09 LAB — URINALYSIS, ROUTINE W REFLEX MICROSCOPIC
Bilirubin Urine: NEGATIVE
GLUCOSE, UA: NEGATIVE mg/dL
Hgb urine dipstick: NEGATIVE
Ketones, ur: NEGATIVE mg/dL
LEUKOCYTES UA: NEGATIVE
Nitrite: NEGATIVE
Protein, ur: NEGATIVE mg/dL
SPECIFIC GRAVITY, URINE: 1.021 (ref 1.005–1.030)
pH: 6 (ref 5.0–8.0)

## 2018-06-09 LAB — CBC WITH DIFFERENTIAL/PLATELET
Abs Immature Granulocytes: 0.02 10*3/uL (ref 0.00–0.07)
BASOS ABS: 0 10*3/uL (ref 0.0–0.1)
BASOS PCT: 0 %
Eosinophils Absolute: 0.1 10*3/uL (ref 0.0–0.5)
Eosinophils Relative: 1 %
HCT: 38.5 % — ABNORMAL LOW (ref 39.0–52.0)
Hemoglobin: 12.2 g/dL — ABNORMAL LOW (ref 13.0–17.0)
IMMATURE GRANULOCYTES: 0 %
Lymphocytes Relative: 8 %
Lymphs Abs: 0.7 10*3/uL (ref 0.7–4.0)
MCH: 29.7 pg (ref 26.0–34.0)
MCHC: 31.7 g/dL (ref 30.0–36.0)
MCV: 93.7 fL (ref 80.0–100.0)
Monocytes Absolute: 0.4 10*3/uL (ref 0.1–1.0)
Monocytes Relative: 4 %
NEUTROS ABS: 8 10*3/uL — AB (ref 1.7–7.7)
NEUTROS PCT: 87 %
NRBC: 0 % (ref 0.0–0.2)
PLATELETS: 158 10*3/uL (ref 150–400)
RBC: 4.11 MIL/uL — ABNORMAL LOW (ref 4.22–5.81)
RDW: 12.6 % (ref 11.5–15.5)
WBC: 9.2 10*3/uL (ref 4.0–10.5)

## 2018-06-09 LAB — COMPREHENSIVE METABOLIC PANEL
ALBUMIN: 3.7 g/dL (ref 3.5–5.0)
ALT: 25 U/L (ref 0–44)
ANION GAP: 3 — AB (ref 5–15)
AST: 24 U/L (ref 15–41)
Alkaline Phosphatase: 51 U/L (ref 38–126)
BILIRUBIN TOTAL: 0.9 mg/dL (ref 0.3–1.2)
BUN: 15 mg/dL (ref 6–20)
CHLORIDE: 100 mmol/L (ref 98–111)
CO2: 31 mmol/L (ref 22–32)
Calcium: 8.4 mg/dL — ABNORMAL LOW (ref 8.9–10.3)
Creatinine, Ser: 0.81 mg/dL (ref 0.61–1.24)
GFR calc Af Amer: >60 mL/min (ref >60)
GFR calc non Af Amer: >60 mL/min (ref >60)
GLUCOSE: 168 mg/dL — AB (ref 70–99)
POTASSIUM: 3.9 mmol/L (ref 3.5–5.1)
Sodium: 134 mmol/L — ABNORMAL LOW (ref 135–145)
TOTAL PROTEIN: 7.3 g/dL (ref 6.5–8.1)

## 2018-06-09 LAB — RESPIRATORY PANEL BY PCR
Adenovirus: NOT DETECTED
Bordetella pertussis: NOT DETECTED
CHLAMYDOPHILA PNEUMONIAE-RVPPCR: NOT DETECTED
CORONAVIRUS OC43-RVPPCR: NOT DETECTED
Coronavirus 229E: NOT DETECTED
Coronavirus HKU1: NOT DETECTED
Coronavirus NL63: NOT DETECTED
INFLUENZA A-RVPPCR: NOT DETECTED
INFLUENZA B-RVPPCR: NOT DETECTED
Metapneumovirus: NOT DETECTED
Mycoplasma pneumoniae: NOT DETECTED
PARAINFLUENZA VIRUS 1-RVPPCR: NOT DETECTED
PARAINFLUENZA VIRUS 2-RVPPCR: NOT DETECTED
PARAINFLUENZA VIRUS 3-RVPPCR: NOT DETECTED
Parainfluenza Virus 4: NOT DETECTED
Respiratory Syncytial Virus: NOT DETECTED
Rhinovirus / Enterovirus: NOT DETECTED

## 2018-06-09 LAB — STREP PNEUMONIAE URINARY ANTIGEN: STREP PNEUMO URINARY ANTIGEN: NEGATIVE

## 2018-06-09 LAB — PROTIME-INR
INR: 1.07
PROTHROMBIN TIME: 13.8 s (ref 11.4–15.2)

## 2018-06-09 LAB — INFLUENZA PANEL BY PCR (TYPE A & B)
INFLAPCR: NEGATIVE
INFLBPCR: NEGATIVE

## 2018-06-09 LAB — I-STAT CG4 LACTIC ACID, ED: LACTIC ACID, VENOUS: 1.6 mmol/L (ref 0.5–1.9)

## 2018-06-09 MED ORDER — SODIUM CHLORIDE 0.9 % IV SOLN
2.0000 g | Freq: Once | INTRAVENOUS | Status: AC
  Administered 2018-06-09: 2 g via INTRAVENOUS
  Filled 2018-06-09: qty 2

## 2018-06-09 MED ORDER — IOPAMIDOL (ISOVUE-370) INJECTION 76%
100.0000 mL | Freq: Once | INTRAVENOUS | Status: AC | PRN
  Administered 2018-06-09: 100 mL via INTRAVENOUS

## 2018-06-09 MED ORDER — VANCOMYCIN HCL IN DEXTROSE 1-5 GM/200ML-% IV SOLN
1000.0000 mg | Freq: Once | INTRAVENOUS | Status: AC
  Administered 2018-06-09: 1000 mg via INTRAVENOUS
  Filled 2018-06-09: qty 200

## 2018-06-09 MED ORDER — SODIUM CHLORIDE 0.9 % IV BOLUS (SEPSIS): 1000.0000 mL | Freq: Once | INTRAVENOUS | Status: DC

## 2018-06-09 MED ORDER — ALPRAZOLAM 0.5 MG PO TABS: 0.5000 mg | ORAL_TABLET | Freq: Every day | ORAL | Status: DC

## 2018-06-09 MED ORDER — SODIUM CHLORIDE 0.9 % IV BOLUS
1000.0000 mL | Freq: Once | INTRAVENOUS | Status: AC
  Administered 2018-06-09: 1000 mL via INTRAVENOUS

## 2018-06-09 MED ORDER — ENOXAPARIN SODIUM 40 MG/0.4ML ~~LOC~~ SOLN
40.0000 mg | SUBCUTANEOUS | Status: DC
  Administered 2018-06-09 – 2018-06-11 (×3): 40 mg via SUBCUTANEOUS
  Filled 2018-06-09 (×3): qty 0.4

## 2018-06-09 MED ORDER — FAMOTIDINE IN NACL 20-0.9 MG/50ML-% IV SOLN
20.0000 mg | INTRAVENOUS | Status: DC
  Administered 2018-06-10: 20 mg via INTRAVENOUS
  Filled 2018-06-09: qty 50

## 2018-06-09 MED ORDER — LORAZEPAM 2 MG/ML IJ SOLN: 0.2500 mg | Freq: Four times a day (QID) | INTRAMUSCULAR | Status: DC | PRN

## 2018-06-09 MED ORDER — SODIUM CHLORIDE 0.9 % IV SOLN: INTRAVENOUS | Status: DC

## 2018-06-09 MED ORDER — SODIUM CHLORIDE 0.9 % IV SOLN
INTRAVENOUS | Status: DC
  Administered 2018-06-09 – 2018-06-10 (×3): via INTRAVENOUS

## 2018-06-09 MED ORDER — PANTOPRAZOLE SODIUM 40 MG PO TBEC
40.0000 mg | DELAYED_RELEASE_TABLET | Freq: Every day | ORAL | Status: DC
  Administered 2018-06-09: 40 mg via ORAL
  Filled 2018-06-09: qty 1

## 2018-06-09 MED ORDER — ALBUTEROL SULFATE (2.5 MG/3ML) 0.083% IN NEBU: 3.0000 mL | INHALATION_SOLUTION | Freq: Four times a day (QID) | RESPIRATORY_TRACT | Status: DC | PRN

## 2018-06-09 MED ORDER — SODIUM CHLORIDE 0.9 % IV BOLUS (SEPSIS)
1000.0000 mL | Freq: Once | INTRAVENOUS | Status: AC
  Administered 2018-06-09: 1000 mL via INTRAVENOUS

## 2018-06-09 MED ORDER — LEVOTHYROXINE SODIUM 25 MCG PO TABS
25.0000 ug | ORAL_TABLET | Freq: Every day | ORAL | Status: DC
  Administered 2018-06-09: 25 ug via ORAL
  Filled 2018-06-09: qty 1

## 2018-06-09 MED ORDER — VANCOMYCIN HCL IN DEXTROSE 1-5 GM/200ML-% IV SOLN
1000.0000 mg | Freq: Three times a day (TID) | INTRAVENOUS | Status: DC
  Administered 2018-06-09 – 2018-06-10 (×3): 1000 mg via INTRAVENOUS
  Filled 2018-06-09 (×3): qty 200

## 2018-06-09 MED ORDER — SODIUM CHLORIDE 0.9 % IV SOLN
1.0000 g | Freq: Three times a day (TID) | INTRAVENOUS | Status: DC
  Administered 2018-06-09 – 2018-06-10 (×3): 1 g via INTRAVENOUS
  Filled 2018-06-09 (×13): qty 1

## 2018-06-09 MED ORDER — ATENOLOL 25 MG PO TABS
25.0000 mg | ORAL_TABLET | Freq: Every day | ORAL | Status: DC
  Administered 2018-06-09: 25 mg via ORAL
  Filled 2018-06-09: qty 1

## 2018-06-09 MED ORDER — LEVOTHYROXINE SODIUM 100 MCG IV SOLR
12.5000 ug | Freq: Every day | INTRAVENOUS | Status: DC
  Administered 2018-06-10: 12.5 ug via INTRAVENOUS
  Filled 2018-06-09 (×4): qty 5

## 2018-06-09 NOTE — ED Notes (Signed)
EDP in room  

## 2018-06-09 NOTE — ED Triage Notes (Signed)
Pt brought in by [facility] for c/o fever; pt temp was 102 with ems; ems administered 1gm or tylenol and 400mg  ibuprofen; family told ems pt was having some altered loc; pt's O2 sat with ems was 82%

## 2018-06-09 NOTE — Care Management (Signed)
CM in to visit with patient to screen for needs. Pt uninsured, unemployed, has strong family support. Last DC pt was to follow up at [facility], they would not see him d/t is uninsured status and pt receiving financial assistance through [facility]. Pt plans to see Dr. Juanetta Gosling for pulmonology, has no yet made appointment. CM will request appointment be made prior to DC. CM provided list of PCP's accepting new patients in [place].

## 2018-06-09 NOTE — Consult Note (Addendum)
Referring Provider: Cleora Fleet, MD Primary Care Physician:  Christian Nevins, MD Primary Gastroenterologist: Christian December, MD  Reason for Consultation: Aspiration, esophageal stricture  HPI: Christian Ellison is a 57 y.o. male history of hepatitis C (previously with positive HCV antibody, RNA weakly positive but appeared to have cleared virus on his own in early 2015 with negative RNA. Now with weakly positive HCV RNA in 02/2018), polysubstance abuse, hypertension, chronic pain, esophageal stricture status post dilation in August 2019, history of aspiration pneumonia who was brought in by EMS for complaints of fever, temperature 102, altered level of consciousness, O2 sats 82%.  Patient reported coughing up clear and yellow mucus.  Shortness of breath.  Somewhat difficult historian.  Fianc Christian Ellison) and sister Christian Ellison) at bedside.  Patient apparently advised to be on a liquid diet after recent hospitalization 2 weeks ago.  Last barium esophagram showed very little barium passing into the stomach.  And had a EGD with dilation of distal stricture 2 months ago.  Girlfriend states that he started sounding gurgly and oxygen level dropped, became confused, developed a temperature and she called EMS.  Patient denies heartburn.  He denies abdominal pain.  He is hungry.  He has straightaway from a liquid diet.  No other GI complaints.  He had a chest x-ray on arrival showing resolution of previously seen right lower lobe pneumonia.  CTA with contrast today showed mild bronchial wall thickening.  Left greater than right tree-in-bud infiltrates.  Stable sub-solid peripheral pulmonary nodules measuring 5 mm.  Labs with normal BUN and creatinine, glucose 168, hemoglobin 12.2 improved from 2 weeks ago.  Platelet count normal.  Lactic acid 1.6     Pertinent recent PMH: According to the medical records patient has a history of abusing benzos, heroin.  Back in May he had acute hypoxic respiratory  failure likely due to pneumonia in the setting of aspiration speech therapy evaluation revealed mild aspiration risk.  He required CPR in the field prior to presenting to the hospital.  Barium esophagram demonstrated mild decrease in esophageal peristalsis, narrowing at the GE junction with occasional delayed passage of barium and the barium tablet did not pass into the stomach.    He had an EGD with Dr. Karilyn Cota in August 2019 which showed normal proximal and mid esophagus, mild esophageal stenosis at the GJ status post dilation.  Patient hospitalized at [facility] from October 10-12 with sepsis secondary to right lobar pneumonia.  Barium esophagram done on October 11 showing esophageal dysmotility with disruption of the primary peristolic wave and tertiary contractions.  Very little barium passed into the stomach.  Smooth stricture narrowing of the distal esophagus just above the GE junction similar to prior exam in May. Evaluated by GI while inpatient, recommendations were to pursue EGD and manometry as an outpatient.  Dr. Karilyn Cota made aware of persistent findings/symptoms and plans for esophageal manometry/impedence study.    Prior to Admission medications   Medication Sig Start Date End Date Taking? Authorizing Provider  acetaminophen (TYLENOL) 650 MG CR tablet Take 650 mg by mouth every 8 (eight) hours as needed for pain.    [provider]  albuterol (PROVENTIL HFA;VENTOLIN HFA) 108 (90 Base) MCG/ACT inhaler Inhale 2 puffs into the lungs every 6 (six) hours as needed for wheezing or shortness of breath. 05/28/18   Ghimire, Werner Lean, MD  ALPRAZolam Prudy Feeler) 0.5 MG tablet Take 0.5 mg by mouth at bedtime.    [provider]  amoxicillin-clavulanate (AUGMENTIN) 875-125 MG  tablet Take 1 tablet by mouth 2 (two) times daily. 05/28/18   Ghimire, Werner Lean, MD  atenolol (TENORMIN) 25 MG tablet Take 25 mg by mouth daily.    [provider]  esomeprazole (NEXIUM) 40 MG capsule Take 1  capsule (40 mg total) by mouth daily. Patient taking differently: Take 40 mg by mouth at bedtime.  02/03/18 02/03/19  Erick Blinks, MD  levothyroxine (SYNTHROID, LEVOTHROID) 25 MCG tablet Take 25 mcg by mouth daily. 05/13/18   [provider]  Multiple Vitamin (MULTIVITAMIN WITH MINERALS) TABS tablet Take 1 tablet by mouth daily. 50+    [provider]    Current Facility-Administered Medications  Medication Dose Route Frequency Provider Last Rate Last Dose  . 0.9 %  sodium chloride infusion   Intravenous Continuous Pearson Grippe, MD 75 mL/hr at 06/09/18 0507    . albuterol (PROVENTIL) (2.5 MG/3ML) 0.083% nebulizer solution 3 mL  3 mL Inhalation Q6H PRN Pearson Grippe, MD      . ALPRAZolam Prudy Feeler) tablet 0.5 mg  0.5 mg Oral QHS Pearson Grippe, MD      . atenolol (TENORMIN) tablet 25 mg  25 mg Oral Daily Pearson Grippe, MD      . ceFEPIme (MAXIPIME) 1 g in sodium chloride 0.9 % 100 mL IVPB  1 g Intravenous Q8H Pearson Grippe, MD      . enoxaparin (LOVENOX) injection 40 mg  40 mg Subcutaneous Q24H Pearson Grippe, MD   40 mg at 06/09/18 1610  . levothyroxine (SYNTHROID, LEVOTHROID) tablet 25 mcg  25 mcg Oral Daily Pearson Grippe, MD   25 mcg at 06/09/18 0618  . pantoprazole (PROTONIX) EC tablet 40 mg  40 mg Oral Daily Pearson Grippe, MD        Allergies as of 06/09/2018  . (No Known Allergies)    Past Medical History:  Diagnosis Date  . Anxiety   . Aspiration pneumonia (HCC) 05/26/2018   RLL  . Chronic pain   . GERD (gastroesophageal reflux disease)   . 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   Marland Kitchen Opiate abuse, continuous (HCC)     Past Surgical History:  Procedure Laterality Date  . BIOPSY  03/18/2018   Procedure: BIOPSY;  Surgeon: Malissa Hippo, MD;  Location: [facility];  Service: Endoscopy;;  gastric   . COLONOSCOPY N/A 11/22/2013   Dr. Karilyn Cota: Normal except for hemorrhoids.  Next colonoscopy 10 years.  . ESOPHAGEAL DILATION N/A 03/18/2018   Procedure: ESOPHAGEAL DILATION;   Surgeon: Malissa Hippo, MD;  Location: [facility];  Service: Endoscopy;  Laterality: N/A;  . ESOPHAGOGASTRODUODENOSCOPY (EGD) WITH PROPOFOL N/A 03/18/2018   Dr. Karilyn Cota: Benign-appearing mild esophageal stenosis proximal to the GE J, status post dilation.  2 cm hiatal hernia.  Gastritis, biopsies negative for H. pylori  . Fracture Right Leg     Patient has rod/screws in this leg  . Rotor Cuff  2012   Right Shoulder    Family History  Problem Relation Age of Onset  . Breast cancer Mother   . Colon cancer Father   . Bladder Cancer Father   . Prostate cancer Father   . Hypertension Sister   . Hypothyroidism Sister   . Healthy Sister   . Healthy Daughter   . Healthy Son   . Healthy Son     Social History   Socioeconomic History  . Marital status: Divorced    Spouse name: Not on file  . Number of children:  Not on file  . Years of education: Not on file  . Highest education level: Not on file  Occupational History  . Not on file  Social Needs  . Financial resource strain: Not on file  . Food insecurity:    Worry: Not on file    Inability: Not on file  . Transportation needs:    Medical: Not on file    Non-medical: Not on file  Tobacco Use  . Smoking status: Never Smoker  . Smokeless tobacco: Never Used  Substance and Sexual Activity  . Alcohol use: No    Comment: Patient states that it has been over a year  . Drug use: Yes    Comment: opiates  . Sexual activity: Yes  Lifestyle  . Physical activity:    Days per week: Not on file    Minutes per session: Not on file  . Stress: Not on file  Relationships  . Social connections:    Talks on phone: Not on file    Gets together: Not on file    Attends religious service: Not on file    Active member of club or organization: Not on file    Attends meetings of clubs or organizations: Not on file    Relationship status: Not on file  . Intimate partner violence:    Fear of current or ex partner: Not on file     Emotionally abused: Not on file    Physically abused: Not on file    Forced sexual activity: Not on file  Other Topics Concern  . Not on file  Social History Narrative  . Not on file     ROS:  General: Negative for anorexia, weight loss, fever, chills, fatigue, positive weakness. Eyes: Negative for vision changes.  ENT: Negative for hoarseness,nasal congestion.  See HPI CV: Negative for chest pain, angina, palpitations, dyspnea on exertion, peripheral edema.  Respiratory: Negative for dyspnea at rest, dyspnea on exertion, positive cough, positive sputum, wheezing.  GI: See history of present illness. GU:  Negative for dysuria, hematuria, urinary incontinence, urinary frequency, nocturnal urination.  MS: Negative for joint pain, low back pain.  Derm: Negative for rash or itching.  Neuro: Negative for weakness, abnormal sensation, seizure, frequent headaches, memory loss, confusion.  Psych: Negative for anxiety, depression, suicidal ideation, hallucinations.  Endo: Negative for unusual weight change.  Heme: Negative for bruising or bleeding. Allergy: Negative for rash or hives.       Physical Examination: Vital signs in last 24 hours: Temp:  [98.5 F (36.9 C)-100.6 F (38.1 C)] 98.5 F (36.9 C) (10/24 0506) Pulse Rate:  [78-120] 78 (10/24 0506) Resp:  [9-20] 20 (10/24 0506) BP: (97-129)/(50-88) 97/50 (10/24 0506) SpO2:  [89 %-98 %] 98 % (10/24 0506) Weight:  [83.9 kg] 83.9 kg (10/24 0200) Last BM Date: 06/08/18  General: Acutely ill-appearing but stable white male in no acute distress.  Head: Normocephalic, atraumatic.   Eyes: Conjunctiva pink, no icterus. Mouth: Oropharyngeal mucosa moist and pink , no lesions erythema or exudate. Neck: Supple without thyromegaly, masses, or lymphadenopathy.  Lungs: Distant breath sounds, slight crackles Heart: Regular rate and rhythm, no murmurs rubs or gallops.  Abdomen: Bowel sounds are normal, nontender, nondistended, no  hepatosplenomegaly or masses, no abdominal bruits or    hernia , no rebound or guarding.   Rectal: Not performed Extremities: No lower extremity edema, clubbing, deformity.  Neuro: Alert and oriented x 4 , grossly normal neurologically.  Skin: Warm and dry, no rash  or jaundice.   Psych: Alert and cooperative, normal mood and affect.        Intake/Output from previous day: 10/23 0701 - 10/24 0700 In: 3341.4 [I.V.:49.7; IV Piggyback:3291.7] Out: 500 [Urine:500] Intake/Output this shift: No intake/output data recorded.  Lab Results: CBC Recent Labs    06/09/18 0216  WBC 9.2  HGB 12.2*  HCT 38.5*  MCV 93.7  PLT 158   BMET Recent Labs    06/09/18 0216  NA 134*  K 3.9  CL 100  CO2 31  GLUCOSE 168*  BUN 15  CREATININE 0.81  CALCIUM 8.4*   LFT Recent Labs    06/09/18 0216  BILITOT 0.9  ALKPHOS 51  AST 24  ALT 25  PROT 7.3  ALBUMIN 3.7    Lipase No results for input(s): LIPASE in the last 72 hours.  PT/INR Recent Labs    06/09/18 0216  LABPROT 13.8  INR 1.07      Imaging Studies: Dg Chest 2 View  Result Date: 05/11/2018 CLINICAL DATA:  Cough. EXAM: CHEST - 2 VIEW COMPARISON:  02/03/2018. FINDINGS: Normal sized heart. Mild diffuse peribronchial thickening. Small amount of increased density in the right mid lung zone on the frontal view, not seen on the lateral view. This appears to be due to overlapping ribs and vessels. Otherwise, clear lungs. The lungs are mildly hyperexpanded. Thoracolumbar spine degenerative changes. IMPRESSION: Mild bronchitic changes and mild changes of COPD. Electronically Signed   By: Beckie Salts M.D.   On: 05/11/2018 09:06   Ct Angio Chest Pe W And/or Wo Contrast  Result Date: 06/09/2018 CLINICAL DATA:  Febrile, sepsis.  Assess for pulmonary embolism. EXAM: CT ANGIOGRAPHY CHEST WITH CONTRAST TECHNIQUE: Multidetector CT imaging of the chest was performed using the standard protocol during bolus administration of intravenous  contrast. Multiplanar CT image reconstructions and MIPs were obtained to evaluate the vascular anatomy. CONTRAST:  ISOVUE-370 IOPAMIDOL (ISOVUE-370) INJECTION 76% COMPARISON:  Chest radiograph June 09, 2018 FINDINGS: Cardiovascular: CARDIOVASCULAR: Adequate contrast opacification of the pulmonary artery's. Main pulmonary artery is not enlarged. No pulmonary arterial filling defects to the level of the subsegmental branches. Heart size is normal, no right heart strain. No pericardial effusion. Thoracic aorta is normal course and caliber, unremarkable. MEDIASTINUM/NODES: No lymphadenopathy by CT size criteria. Patulous esophagus. LUNGS/PLEURA: Tracheobronchial tree is patent, mild bronchial wall thickening. LEFT greater than RIGHT tree-in-bud infiltrates. Stable sub solid peripheral pulmonary nodules measuring to 5 mm. UPPER ABDOMEN: Non-acute. MUSCULOSKELETAL: Non-acute. Mild degenerative change of the thoracic spine. Review of the MIP images confirms the above findings. IMPRESSION: 1. No acute pulmonary embolism. 2. Tree-in-bud infiltrates may be infectious or inflammatory. 3. **An incidental finding of potential clinical significance has been found. Multiple subsolid pulmonary nodules, likely reactive given above findings though, recommend follow-up CT chest in 3-6 months. If stable, consider CT HEAD 2 and 4 years. This recommendation follows the consensus statement: Guidelines for Management of Incidental Pulmonary Nodules Detected on CT Images: From the Fleischner Society 2017; Radiology 2017; 284:228-243.** Electronically Signed   By: Awilda Metro M.D.   On: 06/09/2018 03:45   Dg Esophagus  Result Date: 05/27/2018 CLINICAL DATA:  Esophageal dysphagia.  Episodes of pneumonia. EXAM: ESOPHOGRAM/BARIUM SWALLOW TECHNIQUE: Single contrast examination was performed using  thin barium. FLUOROSCOPY TIME:  Fluoroscopy Time:  1 minutes and 48 seconds Radiation Exposure Index (if provided by the  fluoroscopic device): 24.9 mGy Number of Acquired Spot Images: 0 COMPARISON:  Prior study 01/06/2018 FINDINGS: Esophageal dysmotility with disruption  of the primary peristaltic wave and tertiary contractions. Very little barium would pass into the stomach. Smooth strictured narrowing of the distal esophagus just above the GE junction similar to prior examination. IMPRESSION: Persistent smooth strictured narrowing of the distal esophagus with very little passage of barium into the stomach. Electronically Signed   By: Rudie Meyer M.D.   On: 05/27/2018 12:01   Dg Chest Port 1 View  Result Date: 06/09/2018 CLINICAL DATA:  Sepsis EXAM: PORTABLE CHEST 1 VIEW COMPARISON:  05/26/2018 FINDINGS: Previously seen right basilar opacity has improved. Heart is normal size. No confluent opacity currently. No effusions or acute bony abnormality. IMPRESSION: Resolution of the previously seen right lower lobe pneumonia. No acute cardiopulmonary disease. Electronically Signed   By: Charlett Nose M.D.   On: 06/09/2018 02:32   Dg Chest Port 1 View  Result Date: 05/26/2018 CLINICAL DATA:  Fever, history of pneumonia EXAM: PORTABLE CHEST 1 VIEW COMPARISON:  Chest x-ray of 05/10/2017 and 01/02/2018 FINDINGS: There is now parenchymal opacity at the right lung base most consistent with right basilar pneumonia. Minimally prominent markings are present at the left lung base. No pleural effusion is seen. Heart size is stable. IMPRESSION: Right basilar opacity consistent with pneumonia. Electronically Signed   By: Dwyane Dee M.D.   On: 05/26/2018 12:47  [4 week]   Impression: 57 year old male with his fourth hospitalization since May with acute hypoxia/respiratory issues/pneumonia.  Suspected aspiration pneumonia on previous occasions.  Speech therapy evaluation without any overt aspiration concerns.  Barium esophagram back in May with smooth mild stricture in the distal esophagus, tablet would not pass.  EGD in August with  benign-appearing stricture status post dilation.  Follow-up barium esophagram in October showed esophageal dysmotility with disruption of the primary peristaltic wave and tertiary contractions, very little barium was passed into the stomach, smooth stricture narrowing of the distal esophagus just above the GE junction similar to prior examination back in May.  Would be concerned about possible aspiration due to esophageal dysphagia, likely related to motility disorder.  Achalasia not excluded.  Plan: 1. Patient should remain on liquid diet given recent esophagram demonstrating obstruction to flow of even liquid.  Patient likely has motility issue such as achalasia.  He is at risk of aspirating if his esophagus is not emptying. Explained at length with patient, sister, fiance.  2. Next step likely esophageal manometry/impedance which would need to be done in [place], ie [facility] (patient has [facility]). To discuss with Dr. Jena Gauss. 3. Continue PPI.  4. Outpatient follow-up for hepatitis C with Dr. Karilyn Cota.   We would like to thank you for the opportunity to participate in the care of Christian Ellison.  Leanna Battles. Dixon Boos [facility] [phone] 10/24/20199:47 AM     LOS: 0 days    Addendum: Discussed with Dr. Jena Gauss: patient high risk for aspiration given history of significant esophageal stasis/motility concerns. Would advise strict NPO for now, convert medications to IV. Once respiratory status stable, consider esophageal manometry/impedence in [place] likely first of the week.Dr. Laural Benes notified.  Leanna Battles. Dixon Boos [facility] [phone] 10/24/201912:54 PM

## 2018-06-09 NOTE — Progress Notes (Signed)
Pharmacy Antibiotic Note  Christian Ellison is a 57 y.o. male admitted on 06/09/2018 with HCAP.  Pharmacy has been consulted for Vancomycin and cefepime dosing.  Plan: Vancomycin 1000mg  IV every 8 hours.  Goal trough 15-20 mcg/mL.  Cefepime 1gm IV q8h F/U cxs and clinical progress Monitor V/S, labs and levels as indicated  Height: 6' (182.9 cm) Weight: 184 lb 15.5 oz (83.9 kg) IBW/kg (Calculated) : 77.6  Temp (24hrs), Avg:99.6 F (37.6 C), Min:98.5 F (36.9 C), Max:100.6 F (38.1 C)  Recent Labs  Lab 06/09/18 0216 06/09/18 0223  WBC 9.2  --   CREATININE 0.81  --   LATICACIDVEN  --  1.60    Estimated Creatinine Clearance: 111.8 mL/min (by C-G formula based on SCr of 0.81 mg/dL).    No Known Allergies  Antimicrobials this admission: Cefepime 10/24 >>  Vancomycin 10/24 >>   Dose adjustments this admission: n/a  Microbiology results: 10/10 BCx: pending 10/10 MRSA PCR: negative  Thank you for allowing pharmacy to be a part of this patient's care.  Elder Cyphers, BS Pharm D, [place] Clinical Pharmacist Pager [phone] 06/09/2018 7:38 AM
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
## 2018-06-09 NOTE — ED Provider Notes (Signed)
[facility] EMERGENCY DEPARTMENT Provider Note   CSN: [id number] Arrival date & time: 06/09/18  0155     History   Chief Complaint Chief Complaint  Patient presents with  . Fever    HPI Christian Ellison is a 57 y.o. male.  Patient with history of recurrent aspiration pneumonia, GERD, previous opiate abuse presenting from home with shortness of breath and fever.  Symptoms onset today.  Family reports increased confusion.  Was hypoxic to 82% for EMS.  Patient states his been coughing up clear and yellow mucus.  He does endorse some shortness of breath.  Patient admitted to the hospital October 10 to October 12 for aspiration pneumonia and states he completed his course of antibiotics.  He states he did feel better initially and is not certain when he started to feel bad again.  Denies any vomiting, diarrhea, abdominal pain or chest pain.  Denies any recent IV drug abuse.  The history is provided by the patient and the EMS personnel.  Fever   Associated symptoms include congestion and cough. Pertinent negatives include no chest pain, no vomiting and no headaches.    Past Medical History:  Diagnosis Date  . Anxiety   . Aspiration pneumonia (HCC) 05/26/2018   RLL  . Chronic pain   . GERD (gastroesophageal reflux disease)   . 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 Opiate abuse, continuous [facility])     Patient Active Problem List   Diagnosis Date Noted  . Aspiration pneumonia of right lower lobe (HCC)   . Sepsis (HCC) 05/26/2018  . Pulmonary edema 02/02/2018  . HCAP (healthcare-associated pneumonia) 02/02/2018  . Weakness generalized 02/02/2018  . Hypertension 02/02/2018  . CHF (congestive heart failure) (HCC) 02/02/2018  . Nausea & vomiting 02/02/2018  . Dysphagia 02/02/2018  . Esophageal stricture 02/02/2018  . Esophageal stenosis 02/02/2018  . GERD (gastroesophageal reflux disease) 02/02/2018  . Polysubstance abuse (HCC) 02/02/2018  . Acute respiratory failure with  hypoxia (HCC)   . Transaminitis   . Elevated troponin   . Overdose 01/02/2018  . Hx of hepatitis C 01/17/2013  . Depression 01/17/2013  . Anxiety 01/17/2013  . Right knee pain 01/17/2013    Past Surgical History:  Procedure Laterality Date  . BIOPSY  03/18/2018   Procedure: BIOPSY;  Surgeon: Malissa Hippo, MD;  Location: [facility] ENDO [facility];  Service: Endoscopy;;  gastric   . COLONOSCOPY N/A 11/22/2013   Procedure: COLONOSCOPY;  Surgeon: Malissa Hippo, MD;  Location: [facility] ENDO [facility];  Service: Endoscopy;  Laterality: N/A;  1030  . ESOPHAGEAL DILATION N/A 03/18/2018   Procedure: ESOPHAGEAL DILATION;  Surgeon: Malissa Hippo, MD;  Location: [facility] ENDO [facility];  Service: Endoscopy;  Laterality: N/A;  . ESOPHAGOGASTRODUODENOSCOPY (EGD) WITH PROPOFOL N/A 03/18/2018   Procedure: ESOPHAGOGASTRODUODENOSCOPY (EGD) WITH PROPOFOL;  Surgeon: Malissa Hippo, MD;  Location: [facility] END[facility];  Service: Endoscopy;  Laterality: N/A;  8:25  . Fracture Right Leg     Patient has rod/screws in this leg  . Rotor Cuff  2012   Right Shoulder        Home Medications    Prior to Admission medications   Medication Sig Start Date End Date Taking? Authorizing Provider  acetaminophen (TYLENOL) 650 MG CR tablet Take 650 mg by mouth every 8 (eight) hours as needed for pain.    [provider]  albuterol (PROVENTIL HFA;VENTOLIN HFA) 108 (90 Base) MCG/ACT inhaler Inhale 2 puffs into the lungs every 6 (six) hours as  needed for wheezing or shortness of breath. 05/28/18   Ghimire, Werner Lean, MD  ALPRAZolam Prudy Feeler) 0.5 MG tablet Take 0.5 mg by mouth at bedtime.    [provider]  amoxicillin-clavulanate (AUGMENTIN) 875-125 MG tablet Take 1 tablet by mouth 2 (two) times daily. 05/28/18   Ghimire, Werner Lean, MD  atenolol (TENORMIN) 25 MG tablet Take 25 mg by mouth daily.    [provider]  esomeprazole (NEXIUM) 40 MG capsule Take 1 capsule (40 mg total) by mouth daily. Patient taking differently: Take  40 mg by mouth at bedtime.  02/03/18 02/03/19  Erick Blinks, MD  levothyroxine (SYNTHROID, LEVOTHROID) 25 MCG tablet Take 25 mcg by mouth daily. 05/13/18   [provider]  Multiple Vitamin (MULTIVITAMIN WITH MINERALS) TABS tablet Take 1 tablet by mouth daily. 50+    [provider]    Family History Family History  Problem Relation Age of Onset  . Breast cancer Mother   . Colon cancer Father   . Bladder Cancer Father   . Prostate cancer Father   . Hypertension Sister   . Hypothyroidism Sister   . Healthy Sister   . Healthy Daughter   . Healthy Son   . Healthy Son     Social History Social History   Tobacco Use  . Smoking status: Never Smoker  . Smokeless tobacco: Never Used  Substance Use Topics  . Alcohol use: No    Comment: Patient states that it has been over a year  . Drug use: Yes    Comment: opiates     Allergies   Patient has no known allergies.   Review of Systems Review of Systems  Constitutional: Positive for activity change, appetite change and fever.  HENT: Positive for congestion and rhinorrhea.   Eyes: Negative for visual disturbance.  Respiratory: Positive for cough and shortness of breath.   Cardiovascular: Negative for chest pain.  Gastrointestinal: Negative for abdominal pain, nausea and vomiting.  Genitourinary: Negative for dysuria, hematuria and testicular pain.  Musculoskeletal: Negative for arthralgias and myalgias.  Skin: Negative for rash.  Neurological: Negative for dizziness, weakness and headaches.   all other systems are negative except as noted in the HPI and PMH.     Physical Exam Updated Vital Signs BP 129/88 (BP Location: Right Arm)   Pulse (!) 112   Temp (!) 100.6 F (38.1 C) (Oral)   Resp 12   Ht 6' (1.829 m)   Wt 83.9 kg   SpO2 90%   BMI 25.09 kg/m   Physical Exam  Constitutional: He is oriented to person, place, and time. He appears well-developed and well-nourished. He appears distressed.    Ill-appearing, increased work of breathing tachycardic, febrile and hypoxic  HENT:  Head: Normocephalic and atraumatic.  Mouth/Throat: Oropharynx is clear and moist. No oropharyngeal exudate.  Eyes: Pupils are equal, round, and reactive to light. Conjunctivae and EOM are normal.  Neck: Normal range of motion. Neck supple.  No meningismus.  Cardiovascular: Normal rate, normal heart sounds and intact distal pulses.  No murmur heard. Tachycardic 120s  Pulmonary/Chest: Effort normal. No respiratory distress. He has rales.  Bibasilar rhonchi  Abdominal: Soft. There is no tenderness. There is no rebound and no guarding.  Musculoskeletal: Normal range of motion. He exhibits no edema or tenderness.  Neurological: He is alert and oriented to person, place, and time. No cranial nerve deficit. He exhibits normal muscle tone. Coordination normal.   5/5 strength throughout. CN 2-12 intact.Equal grip strength.  Skin: Skin is warm.  Psychiatric: He has a normal mood and affect. His behavior is normal.  Nursing note and vitals reviewed.    ED Treatments / Results  Labs (all labs ordered are listed, but only abnormal results are displayed) Labs Reviewed  COMPREHENSIVE METABOLIC PANEL - Abnormal; Notable for the following components:      Result Value   Sodium 134 (*)    Glucose, Bld 168 (*)    Calcium 8.4 (*)    Anion gap 3 (*)    All other components within normal limits  CBC WITH DIFFERENTIAL/PLATELET - Abnormal; Notable for the following components:   RBC 4.11 (*)    Hemoglobin 12.2 (*)    HCT 38.5 (*)    Neutro Abs 8.0 (*)    All other components within normal limits  BLOOD GAS, ARTERIAL - Abnormal; Notable for the following components:   pH, Arterial 7.340 (*)    pCO2 arterial 52.9 (*)    pO2, Arterial 72.6 (*)    Acid-Base Excess 2.4 (*)    All other components within normal limits  CULTURE, BLOOD (ROUTINE X 2)  CULTURE, BLOOD (ROUTINE X 2)  PROTIME-INR  INFLUENZA PANEL BY PCR  (TYPE A & B)  URINALYSIS, ROUTINE W REFLEX MICROSCOPIC  I-STAT CG4 LACTIC ACID, ED  I-STAT CG4 LACTIC ACID, ED  I-STAT CG4 LACTIC ACID, ED  I-STAT CG4 LACTIC ACID, ED    EKG EKG Interpretation  Date/Time:  Thursday June 09 2018 02:25:05 EDT Ventricular Rate:  109 PR Interval:    QRS Duration: 84 QT Interval:  320 QTC Calculation: 431 R Axis:   16 Text Interpretation:  Sinus tachycardia No significant change was found Confirmed by Glynn Octave [phone]) on 06/09/2018 2:32:08 AM   Radiology Ct Angio Chest Pe W And/or Wo Contrast  Result Date: 06/09/2018 CLINICAL DATA:  Febrile, sepsis.  Assess for pulmonary embolism. EXAM: CT ANGIOGRAPHY CHEST WITH CONTRAST TECHNIQUE: Multidetector CT imaging of the chest was performed using the standard protocol during bolus administration of intravenous contrast. Multiplanar CT image reconstructions and MIPs were obtained to evaluate the vascular anatomy. CONTRAST:  ISOVUE-370 IOPAMIDOL (ISOVUE-370) INJECTION 76% COMPARISON:  Chest radiograph June 09, 2018 FINDINGS: Cardiovascular: CARDIOVASCULAR: Adequate contrast opacification of the pulmonary artery's. Main pulmonary artery is not enlarged. No pulmonary arterial filling defects to the level of the subsegmental branches. Heart size is normal, no right heart strain. No pericardial effusion. Thoracic aorta is normal course and caliber, unremarkable. MEDIASTINUM/NODES: No lymphadenopathy by CT size criteria. Patulous esophagus. LUNGS/PLEURA: Tracheobronchial tree is patent, mild bronchial wall thickening. LEFT greater than RIGHT tree-in-bud infiltrates. Stable sub solid peripheral pulmonary nodules measuring to 5 mm. UPPER ABDOMEN: Non-acute. MUSCULOSKELETAL: Non-acute. Mild degenerative change of the thoracic spine. Review of the MIP images confirms the above findings. IMPRESSION: 1. No acute pulmonary embolism. 2. Tree-in-bud infiltrates may be infectious or inflammatory. 3. **An incidental  finding of potential clinical significance has been found. Multiple subsolid pulmonary nodules, likely reactive given above findings though, recommend follow-up CT chest in 3-6 months. If stable, consider CT HEAD 2 and 4 years. This recommendation follows the consensus statement: Guidelines for Management of Incidental Pulmonary Nodules Detected on CT Images: From the Fleischner Society 2017; Radiology 2017; 284:228-243.** Electronically Signed   By: Awilda Metro M.D.   On: 06/09/2018 03:45   Dg Chest Port 1 View  Result Date: 06/09/2018 CLINICAL DATA:  Sepsis EXAM: PORTABLE CHEST 1 VIEW COMPARISON:  05/26/2018 FINDINGS: Previously seen right basilar opacity  has improved. Heart is normal size. No confluent opacity currently. No effusions or acute bony abnormality. IMPRESSION: Resolution of the previously seen right lower lobe pneumonia. No acute cardiopulmonary disease. Electronically Signed   By: Charlett Nose M.D.   On: 06/09/2018 02:32    Procedures Procedures (including critical care time)  Medications Ordered in ED Medications  sodium chloride 0.9 % bolus 1,000 mL (has no administration in time range)    And  sodium chloride 0.9 % bolus 1,000 mL (has no administration in time range)    And  sodium chloride 0.9 % bolus 1,000 mL (has no administration in time range)  vancomycin (VANCOCIN) IVPB 1000 mg/200 mL premix (has no administration in time range)  ceFEPIme (MAXIPIME) 2 g in sodium chloride 0.9 % 100 mL IVPB (has no administration in time range)     Initial Impression / Assessment and Plan / ED Course  I have reviewed the triage vital signs and the nursing notes.  Pertinent labs & imaging results that were available during my care of the patient were reviewed by me and considered in my medical decision making (see chart for details).    Patient with history of aspiration pneumonia and recent admission for same presenting with shortness of breath, cough and fever.  Code sepsis  activated on arrival.  Patient given IV fluids and IV antibiotics after blood cultures obtained. Chest x-ray shows resolution of previous right lower lobe infiltrate. Lactate is normal.  Patient treated as suspected recurrent aspiration pneumonia.  Did have a swallow study while hospitalized that showed very poor passage of barium into the stomach.  There is plans for outpatient EGD.  GI recommended liquid diet. Unclear whether he has been compliant with this.  CT scan shows no pulmonary embolism.  Does show multiple areas of tree-in-bud opacities consistent with likely pneumonia.  Blood pressure and mental status remained stable.  Patient with no increased work of breathing on nasal cannula.  ABG without significant CO2 retention.  Plan admission for sepsis with acute hypoxic respiratory failure likely secondary to recurrent aspiration pneumonia.  Admission discussed with Dr. Selena Batten.  CRITICAL CARE Performed by: Glynn Octave Total critical care time: 35 minutes Critical care time was exclusive of separately billable procedures and treating other patients. Critical care was necessary to treat or prevent imminent or life-threatening deterioration. Critical care was time spent personally by me on the following activities: development of treatment plan with patient and/or surrogate as well as nursing, discussions with consultants, evaluation of patient's response to treatment, examination of patient, obtaining history from patient or surrogate, ordering and performing treatments and interventions, ordering and review of laboratory studies, ordering and review of radiographic studies, pulse oximetry and re-evaluation of patient's condition.  Final Clinical Impressions(s) / ED Diagnoses   Final diagnoses:  Sepsis with acute hypoxic respiratory failure without septic shock, due to unspecified organism [facility])  Recurrent aspiration pneumonia [facility])    ED Discharge Orders    None         Glynn Octave, MD 06/09/18 [id number]

## 2018-06-09 NOTE — ED Notes (Signed)
Pt unable to void at this time. 

## 2018-06-09 NOTE — Progress Notes (Signed)
06/09/2018  1:56 AM  06/09/2018 1:16 PM  Christian Ellison was seen and examined.  The H&P by the admitting provider, orders, imaging was reviewed.  Please see new orders.  Will continue to follow.  The patient will be kept NPO as he has HIGH RISK for aspiration and likely this is the cause of his recurrent recent pneumonia admissions.  I have converted his oral medications to IV for now.  GI is planning further testing.  Please see full consult notes.  At this time patient remains hemodynamically stable.    Vitals:   06/09/18 0506 06/09/18 0831  BP: (!) 97/50   Pulse: 78   Resp: 20   Temp: 98.5 F (36.9 C)   SpO2: 98% 93%  . Results for orders placed or performed during the hospital encounter of 06/09/18  Culture, blood (Routine x 2)  Result Value Ref Range   Specimen Description BLOOD LEFT HAND    Special Requests      BOTTLES DRAWN AEROBIC AND ANAEROBIC Blood Culture adequate volume   Culture      NO GROWTH < 12 HOURS Performed at [facility], [address], [place], [place] [address]    Report Status PENDING   Culture, blood (Routine x 2)  Result Value Ref Range   Specimen Description BLOOD RIGHT ANTECUBITAL    Special Requests      BOTTLES DRAWN AEROBIC AND ANAEROBIC Blood Culture adequate volume   Culture      NO GROWTH < 12 HOURS Performed at [facility], [address] [place], [place] [address]    Report Status PENDING   Comprehensive metabolic panel  Result Value Ref Range   Sodium 134 (L) 135 - 145 mmol/L   Potassium 3.9 3.5 - 5.1 mmol/L   Chloride 100 98 - 111 mmol/L   CO2 31 22 - 32 mmol/L   Glucose, Bld 168 (H) 70 - 99 mg/dL   BUN 15 6 - 20 mg/dL   Creatinine, Ser 0.98 0.61 - 1.24 mg/dL   Calcium 8.4 (L) 8.9 - 10.3 mg/dL   Total Protein 7.3 6.5 - 8.1 g/dL   Albumin 3.7 3.5 - 5.0 g/dL   AST 24 15 - 41 U/L   ALT 25 0 - 44 U/L   Alkaline Phosphatase 51 38 - 126 U/L   Total Bilirubin 0.9 0.3 - 1.2 mg/dL   GFR calc non Af Amer >60 >60 mL/min   GFR calc  Af Amer >60 >60 mL/min   Anion gap 3 (L) 5 - 15  CBC with Differential  Result Value Ref Range   WBC 9.2 4.0 - 10.5 K/uL   RBC 4.11 (L) 4.22 - 5.81 MIL/uL   Hemoglobin 12.2 (L) 13.0 - 17.0 g/dL   HCT 11.9 (L) 14.7 - 82.9 %   MCV 93.7 80.0 - 100.0 fL   MCH 29.7 26.0 - 34.0 pg   MCHC 31.7 30.0 - 36.0 g/dL   RDW 56.2 13.0 - 86.5 %   Platelets 158 150 - 400 K/uL   nRBC 0.0 0.0 - 0.2 %   Neutrophils Relative % 87 %   Neutro Abs 8.0 (H) 1.7 - 7.7 K/uL   Lymphocytes Relative 8 %   Lymphs Abs 0.7 0.7 - 4.0 K/uL   Monocytes Relative 4 %   Monocytes Absolute 0.4 0.1 - 1.0 K/uL   Eosinophils Relative 1 %   Eosinophils Absolute 0.1 0.0 - 0.5 K/uL   Basophils Relative 0 %   Basophils Absolute  0.0 0.0 - 0.1 K/uL   Immature Granulocytes 0 %   Abs Immature Granulocytes 0.02 0.00 - 0.07 K/uL  Protime-INR  Result Value Ref Range   Prothrombin Time 13.8 11.4 - 15.2 seconds   INR 1.07   Urinalysis, Routine w reflex microscopic  Result Value Ref Range   Color, Urine YELLOW YELLOW   APPearance CLEAR CLEAR   Specific Gravity, Urine 1.021 1.005 - 1.030   pH 6.0 5.0 - 8.0   Glucose, UA NEGATIVE NEGATIVE mg/dL   Hgb urine dipstick NEGATIVE NEGATIVE   Bilirubin Urine NEGATIVE NEGATIVE   Ketones, ur NEGATIVE NEGATIVE mg/dL   Protein, ur NEGATIVE NEGATIVE mg/dL   Nitrite NEGATIVE NEGATIVE   Leukocytes, UA NEGATIVE NEGATIVE  Blood gas, arterial ([facility], [facility], [facility])  Result Value Ref Range   O2 Content 3.0 L/min   Delivery systems NASAL CANNULA    pH, Arterial 7.340 (L) 7.350 - 7.450   pCO2 arterial 52.9 (H) 32.0 - 48.0 mmHg   pO2, Arterial 72.6 (L) 83.0 - 108.0 mmHg   Bicarbonate 25.9 20.0 - 28.0 mmol/L   Acid-Base Excess 2.4 (H) 0.0 - 2.0 mmol/L   O2 Saturation 92.5 %   Patient temperature 38.1    Collection site RIGHT RADIAL    Drawn by [id number]    Sample type ARTERIAL    Allens test (pass/fail) PASS PASS  Influenza panel by PCR (type A & B)  Result Value Ref Range   Influenza A By PCR  NEGATIVE NEGATIVE   Influenza B By PCR NEGATIVE NEGATIVE  I-Stat CG4 Lactic Acid, ED  Result Value Ref Range   Lactic Acid, Venous 1.60 0.5 - 1.9 mmol/L    C. Laural Benes, MD [facility]

## 2018-06-10 DIAGNOSIS — R6520 Severe sepsis without septic shock: Secondary | ICD-10-CM

## 2018-06-10 DIAGNOSIS — R0902 Hypoxemia: Secondary | ICD-10-CM

## 2018-06-10 DIAGNOSIS — A419 Sepsis, unspecified organism: Principal | ICD-10-CM

## 2018-06-10 DIAGNOSIS — I959 Hypotension, unspecified: Secondary | ICD-10-CM

## 2018-06-10 DIAGNOSIS — N179 Acute kidney failure, unspecified: Secondary | ICD-10-CM

## 2018-06-10 DIAGNOSIS — J690 Pneumonitis due to inhalation of food and vomit: Secondary | ICD-10-CM

## 2018-06-10 LAB — CBC WITH DIFFERENTIAL/PLATELET
ABS IMMATURE GRANULOCYTES: 0.02 10*3/uL (ref 0.00–0.07)
BASOS PCT: 0 %
Basophils Absolute: 0 10*3/uL (ref 0.0–0.1)
EOS PCT: 8 %
Eosinophils Absolute: 0.4 10*3/uL (ref 0.0–0.5)
HCT: 33 % — ABNORMAL LOW (ref 39.0–52.0)
Hemoglobin: 10.7 g/dL — ABNORMAL LOW (ref 13.0–17.0)
Immature Granulocytes: 0 %
Lymphocytes Relative: 22 %
Lymphs Abs: 1 10*3/uL (ref 0.7–4.0)
MCH: 30.1 pg (ref 26.0–34.0)
MCHC: 32.4 g/dL (ref 30.0–36.0)
MCV: 92.7 fL (ref 80.0–100.0)
MONO ABS: 0.4 10*3/uL (ref 0.1–1.0)
Monocytes Relative: 9 %
NEUTROS ABS: 2.8 10*3/uL (ref 1.7–7.7)
Neutrophils Relative %: 61 %
PLATELETS: 142 10*3/uL — AB (ref 150–400)
RBC: 3.56 MIL/uL — AB (ref 4.22–5.81)
RDW: 12.3 % (ref 11.5–15.5)
WBC: 4.6 10*3/uL (ref 4.0–10.5)
nRBC: 0 % (ref 0.0–0.2)

## 2018-06-10 LAB — RENAL FUNCTION PANEL
ALBUMIN: 3.1 g/dL — AB (ref 3.5–5.0)
Anion gap: 4 — ABNORMAL LOW (ref 5–15)
BUN: 6 mg/dL (ref 6–20)
CALCIUM: 8.2 mg/dL — AB (ref 8.9–10.3)
CO2: 29 mmol/L (ref 22–32)
Chloride: 108 mmol/L (ref 98–111)
Creatinine, Ser: 0.53 mg/dL — ABNORMAL LOW (ref 0.61–1.24)
GFR calc non Af Amer: >60 mL/min (ref >60)
Glucose, Bld: 98 mg/dL (ref 70–99)
PHOSPHORUS: 2.8 mg/dL (ref 2.5–4.6)
Potassium: 3.9 mmol/L (ref 3.5–5.1)
Sodium: 141 mmol/L (ref 135–145)

## 2018-06-10 LAB — MAGNESIUM: MAGNESIUM: 1.9 mg/dL (ref 1.7–2.4)

## 2018-06-10 MED ORDER — SODIUM CHLORIDE 0.9 % IV SOLN
3.0000 g | Freq: Three times a day (TID) | INTRAVENOUS | Status: DC
  Administered 2018-06-10 – 2018-06-11 (×3): 3 g via INTRAVENOUS
  Filled 2018-06-10 (×10): qty 3

## 2018-06-10 NOTE — Progress Notes (Signed)
PROGRESS NOTE  Christian Ellison  ZOX:[id number]  DOB: Dec 03, 1960  DOA: 06/09/2018 PCP: Elfredia Nevins, MD  Brief Admission Hx: 57 year old male with known history of esophageal stricture and multiple admissions for aspiration pneumonia presented to the ED with fever and cough.  MDM/Assessment & Plan:   1. Sepsis secondary to recurrent aspiration pneumonia-treating with IV antibiotics.  De-escalate to IV unasyn today per pharmacist dosing. Continue supportive care and follow closely.  Clinically he is improving. 2. Hypotension-resolved with IV fluid hydration.  Continue IV fluid support. 3. Esophageal stricture-GI is consulted and they are very concerned about achalasia and I have recommended that the patient remain n.p.o. strictly until further evaluation.  I have a suspicion that the patient's partner has provided him with food in the room and I have advised against that.  He is very high risk for recurrent aspiration.  The patient verbalized understanding. 4. GERD-continue IV famotidine. 5. Hypothyroidism-continue levothyroxine daily. 6. Essential hypertension- blood pressures have been stable and controlled.  We are currently holding all oral medications.  Would use IV therapies if needed for blood pressure management. 7. Generalized anxiety disorder-IV lorazepam ordered as needed. 8. Pulmonary nodules- the patient would need repeat CT chest in 3 to 6 months for surveillance.  DVT prophylaxis: Lovenox/SCDs Code Status: FULL Family Communication: Patient and spouse at bedside Disposition Plan: Inpatient for IV therapies as he is currently strict n.p.o.  Consultants:  GI  Subjective: The patient reports that he is upset that he is not able to eat and drink.  He says that he has been able to manage at home with a soft diet.  He does not fully understand the seriousness of why he has been made n.p.o.  I tried to counsel him at the bedside but he has threatened that he may leave the  hospital if he is not allowed to eat.  Objective: Vitals:   06/09/18 0831 06/09/18 1451 06/09/18 2145 06/10/18 0533  BP:  95/68 104/66 127/71  Pulse:  78 65 60  Resp:  18 19 17   Temp:  98.6 F (37 C) 97.8 F (36.6 C) 98.5 F (36.9 C)  TempSrc:  Oral Oral Oral  SpO2: 93% 95% 99% 94%  Weight:      Height:        Intake/Output Summary (Last 24 hours) at 06/10/2018 0905 Last data filed at 06/10/2018 0300 Gross per 24 hour  Intake 1979.66 ml  Output 700 ml  Net 1279.66 ml   Filed Weights   06/09/18 0200  Weight: 83.9 kg   REVIEW OF SYSTEMS  As per history otherwise all reviewed and reported negative  Exam:  General exam: Awake, alert, no apparent distress, cooperative. Respiratory system: Rales left lower lobe. No increased work of breathing. Cardiovascular system: S1 & S2 heard, RRR. No JVD, murmurs, gallops, clicks or pedal edema. Gastrointestinal system: Abdomen is nondistended, soft and nontender. Normal bowel sounds heard. Central nervous system: Alert and oriented. No focal neurological deficits. Extremities: no CCE.  Data Reviewed: Basic Metabolic Panel: Recent Labs  Lab 06/09/18 0216 06/10/18 0529  NA 134* 141  K 3.9 3.9  CL 100 108  CO2 31 29  GLUCOSE 168* 98  BUN 15 6  CREATININE 0.81 0.53*  CALCIUM 8.4* 8.2*  MG  --  1.9  PHOS  --  2.8   Liver Function Tests: Recent Labs  Lab 06/09/18 0216 06/10/18 0529  AST 24  --   ALT 25  --   ALKPHOS 51  --  BILITOT 0.9  --   PROT 7.3  --   ALBUMIN 3.7 3.1*   No results for input(s): LIPASE, AMYLASE in the last 168 hours. No results for input(s): AMMONIA in the last 168 hours. CBC: Recent Labs  Lab 06/09/18 0216 06/10/18 0529  WBC 9.2 4.6  NEUTROABS 8.0* 2.8  HGB 12.2* 10.7*  HCT 38.5* 33.0*  MCV 93.7 92.7  PLT 158 142*   Cardiac Enzymes: No results for input(s): CKTOTAL, CKMB, CKMBINDEX, TROPONINI in the last 168 hours. CBG (last 3)  No results for input(s): GLUCAP in the last 72  hours. Recent Results (from the past 240 hour(s))  Culture, blood (Routine x 2)     Status: None (Preliminary result)   Collection Time: 06/09/18  2:02 AM  Result Value Ref Range Status   Specimen Description BLOOD LEFT HAND  Final   Special Requests   Final    BOTTLES DRAWN AEROBIC AND ANAEROBIC Blood Culture adequate volume   Culture   Final    NO GROWTH 1 DAY Performed at [facility], [address] [place], [place] [address]    Report Status PENDING  Incomplete  Culture, blood (Routine x 2)     Status: None (Preliminary result)   Collection Time: 06/09/18  2:20 AM  Result Value Ref Range Status   Specimen Description BLOOD RIGHT ANTECUBITAL  Final   Special Requests   Final    BOTTLES DRAWN AEROBIC AND ANAEROBIC Blood Culture adequate volume   Culture   Final    NO GROWTH 1 DAY Performed at [facility], [address] [place], [place] [address]    Report Status PENDING  Incomplete  Respiratory Panel by PCR     Status: None   Collection Time: 06/09/18  5:17 AM  Result Value Ref Range Status   Adenovirus NOT DETECTED NOT DETECTED Final   Coronavirus 229E NOT DETECTED NOT DETECTED Final   Coronavirus HKU1 NOT DETECTED NOT DETECTED Final   Coronavirus NL63 NOT DETECTED NOT DETECTED Final   Coronavirus OC43 NOT DETECTED NOT DETECTED Final   Metapneumovirus NOT DETECTED NOT DETECTED Final   Rhinovirus / Enterovirus NOT DETECTED NOT DETECTED Final   Influenza A NOT DETECTED NOT DETECTED Final   Influenza B NOT DETECTED NOT DETECTED Final   Parainfluenza Virus 1 NOT DETECTED NOT DETECTED Final   Parainfluenza Virus 2 NOT DETECTED NOT DETECTED Final   Parainfluenza Virus 3 NOT DETECTED NOT DETECTED Final   Parainfluenza Virus 4 NOT DETECTED NOT DETECTED Final   Respiratory Syncytial Virus NOT DETECTED NOT DETECTED Final   Bordetella pertussis NOT DETECTED NOT DETECTED Final   Chlamydophila pneumoniae NOT DETECTED NOT DETECTED Final   Mycoplasma pneumoniae NOT DETECTED NOT  DETECTED Final    Comment: Performed at [facility], [address], [place], [place] [address]    Studies: Ct Angio Chest Pe W And/or Wo Contrast  Result Date: 06/09/2018 CLINICAL DATA:  Febrile, sepsis.  Assess for pulmonary embolism. EXAM: CT ANGIOGRAPHY CHEST WITH CONTRAST TECHNIQUE: Multidetector CT imaging of the chest was performed using the standard protocol during bolus administration of intravenous contrast. Multiplanar CT image reconstructions and MIPs were obtained to evaluate the vascular anatomy. CONTRAST:  ISOVUE-370 IOPAMIDOL (ISOVUE-370) INJECTION 76% COMPARISON:  Chest radiograph June 09, 2018 FINDINGS: Cardiovascular: CARDIOVASCULAR: Adequate contrast opacification of the pulmonary artery's. Main pulmonary artery is not enlarged. No pulmonary arterial filling defects to the level of the subsegmental branches. Heart size is normal, no right heart  strain. No pericardial effusion. Thoracic aorta is normal course and caliber, unremarkable. MEDIASTINUM/NODES: No lymphadenopathy by CT size criteria. Patulous esophagus. LUNGS/PLEURA: Tracheobronchial tree is patent, mild bronchial wall thickening. LEFT greater than RIGHT tree-in-bud infiltrates. Stable sub solid peripheral pulmonary nodules measuring to 5 mm. UPPER ABDOMEN: Non-acute. MUSCULOSKELETAL: Non-acute. Mild degenerative change of the thoracic spine. Review of the MIP images confirms the above findings. IMPRESSION: 1. No acute pulmonary embolism. 2. Tree-in-bud infiltrates may be infectious or inflammatory. 3. **An incidental finding of potential clinical significance has been found. Multiple subsolid pulmonary nodules, likely reactive given above findings though, recommend follow-up CT chest in 3-6 months. If stable, consider CT HEAD 2 and 4 years. This recommendation follows the consensus statement: Guidelines for Management of Incidental Pulmonary Nodules Detected on CT Images: From the Fleischner Society 2017;  Radiology 2017; 284:228-243.** Electronically Signed   By: Awilda Metro M.D.   On: 06/09/2018 03:45   Dg Chest Port 1 View  Result Date: 06/09/2018 CLINICAL DATA:  Sepsis EXAM: PORTABLE CHEST 1 VIEW COMPARISON:  05/26/2018 FINDINGS: Previously seen right basilar opacity has improved. Heart is normal size. No confluent opacity currently. No effusions or acute bony abnormality. IMPRESSION: Resolution of the previously seen right lower lobe pneumonia. No acute cardiopulmonary disease. Electronically Signed   By: Charlett Nose M.D.   On: 06/09/2018 02:32   Scheduled Meds: . enoxaparin (LOVENOX) injection  40 mg Subcutaneous Q24H  . levothyroxine  12.5 mcg Intravenous Daily   Continuous Infusions: . sodium chloride 75 mL/hr at 06/10/18 0524  . ceFEPime (MAXIPIME) IV 1 g (06/10/18 0525)  . famotidine (PEPCID) IV    . vancomycin 1,000 mg (06/10/18 0215)   Principal Problem:   Sepsis (HCC) Active Problems:   Fever   Tachycardia   Hypoxia   Hypotension   Esophageal dysmotility  Time spent:   Standley Dakins, MD, FAAFP [facility] Pager [phone]  If 7PM-7AM, please contact night-coverage [email or web address] Password [email or web address] 06/10/2018, 9:05 AM    LOS: 1 day

## 2018-06-10 NOTE — Progress Notes (Signed)
Patient requesting for nurse and tech not to enter during the night unless he needs something. Patient stated that he did not sleep last night and wants to be left alone with the door closed. Educated the patient on hourly rounding and safety precautions. Patient refusing bed alarm. Instructed the patient on the call bell and to call if he needs assistance.

## 2018-06-10 NOTE — Progress Notes (Signed)
Subjective: Patient and wife very frustrated and agitated. Didn't sleep well last night, thought staff was loud during the night "like they're having a part out there or something." Wants to eat, wants to go home. Feels he is fine "as long as I don't eat the wrong things." Denies overt abdominal pain, N/V. Feels his breathing is fine. No other GI symptoms.  Objective: Vital signs in last 24 hours: Temp:  [97.8 F (36.6 C)-98.6 F (37 C)] 98.5 F (36.9 C) (10/25 0533) Pulse Rate:  [60-78] 60 (10/25 0533) Resp:  [17-19] 17 (10/25 0533) BP: (95-127)/(66-71) 127/71 (10/25 0533) SpO2:  [94 %-99 %] 94 % (10/25 0533) Last BM Date: 06/08/18 General:   Alert and oriented, pleasant Eyes:  No icterus, sclera clear. Conjuctiva pink.  Heart:  S1, S2 present, no murmurs noted.  Lungs: Clear to auscultation bilaterally, without wheezing, rales, or rhonchi.  Abdomen:  Bowel sounds present, soft, non-tender, non-distended. No HSM or hernias noted. No rebound or guarding. No masses appreciated  Msk:  Symmetrical without gross deformities. Pulses:  Normal bilateral pulses noted. Extremities:  Without clubbing or edema. Neurologic:  Alert and  oriented x4;  grossly normal neurologically. Psych:  Alert and cooperative. Normal mood and affect.  Intake/Output from previous day: 10/24 0701 - 10/25 0700 In: 1979.7 [I.V.:1234.5; IV Piggyback:745.2] Out: 700 [Urine:700] Intake/Output this shift: No intake/output data recorded.  Lab Results: Recent Labs    06/09/18 0216 06/10/18 0529  WBC 9.2 4.6  HGB 12.2* 10.7*  HCT 38.5* 33.0*  PLT 158 142*   BMET Recent Labs    06/09/18 0216 06/10/18 0529  NA 134* 141  K 3.9 3.9  CL 100 108  CO2 31 29  GLUCOSE 168* 98  BUN 15 6  CREATININE 0.81 0.53*  CALCIUM 8.4* 8.2*   LFT Recent Labs    06/09/18 0216 06/10/18 0529  PROT 7.3  --   ALBUMIN 3.7 3.1*  AST 24  --   ALT 25  --   ALKPHOS 51  --   BILITOT 0.9  --    PT/INR Recent Labs     06/09/18 0216  LABPROT 13.8  INR 1.07   Hepatitis Panel No results for input(s): HEPBSAG, HCVAB, HEPAIGM, HEPBIGM in the last 72 hours.   Studies/Results: Ct Angio Chest Pe W And/or Wo Contrast  Result Date: 06/09/2018 CLINICAL DATA:  Febrile, sepsis.  Assess for pulmonary embolism. EXAM: CT ANGIOGRAPHY CHEST WITH CONTRAST TECHNIQUE: Multidetector CT imaging of the chest was performed using the standard protocol during bolus administration of intravenous contrast. Multiplanar CT image reconstructions and MIPs were obtained to evaluate the vascular anatomy. CONTRAST:  ISOVUE-370 IOPAMIDOL (ISOVUE-370) INJECTION 76% COMPARISON:  Chest radiograph June 09, 2018 FINDINGS: Cardiovascular: CARDIOVASCULAR: Adequate contrast opacification of the pulmonary artery's. Main pulmonary artery is not enlarged. No pulmonary arterial filling defects to the level of the subsegmental branches. Heart size is normal, no right heart strain. No pericardial effusion. Thoracic aorta is normal course and caliber, unremarkable. MEDIASTINUM/NODES: No lymphadenopathy by CT size criteria. Patulous esophagus. LUNGS/PLEURA: Tracheobronchial tree is patent, mild bronchial wall thickening. LEFT greater than RIGHT tree-in-bud infiltrates. Stable sub solid peripheral pulmonary nodules measuring to 5 mm. UPPER ABDOMEN: Non-acute. MUSCULOSKELETAL: Non-acute. Mild degenerative change of the thoracic spine. Review of the MIP images confirms the above findings. IMPRESSION: 1. No acute pulmonary embolism. 2. Tree-in-bud infiltrates may be infectious or inflammatory. 3. **An incidental finding of potential clinical significance has been found. Multiple subsolid pulmonary nodules,  likely reactive given above findings though, recommend follow-up CT chest in 3-6 months. If stable, consider CT HEAD 2 and 4 years. This recommendation follows the consensus statement: Guidelines for Management of Incidental Pulmonary Nodules Detected on CT  Images: From the Fleischner Society 2017; Radiology 2017; 284:228-243.** Electronically Signed   By: Awilda Metro M.D.   On: 06/09/2018 03:45   Dg Chest Port 1 View  Result Date: 06/09/2018 CLINICAL DATA:  Sepsis EXAM: PORTABLE CHEST 1 VIEW COMPARISON:  05/26/2018 FINDINGS: Previously seen right basilar opacity has improved. Heart is normal size. No confluent opacity currently. No effusions or acute bony abnormality. IMPRESSION: Resolution of the previously seen right lower lobe pneumonia. No acute cardiopulmonary disease. Electronically Signed   By: Charlett Nose M.D.   On: 06/09/2018 02:32    Assessment: 57 year old male with his fourth hospitalization since May with acute hypoxia/respiratory issues/pneumonia.  Suspected aspiration pneumonia on previous occasions.  Speech therapy evaluation without any overt aspiration concerns.  Barium esophagram back in May with smooth mild stricture in the distal esophagus, tablet would not pass.  EGD in August with benign-appearing stricture status post dilation.  Follow-up barium esophagram in October showed esophageal dysmotility with disruption of the primary peristaltic wave and tertiary contractions, very little barium was passed into the stomach, smooth stricture narrowing of the distal esophagus just above the GE junction similar to prior examination back in May.  Esophagus felt to be non-functioning, achalasia until otherwise proven. Patient was made strict NPO due to high aspiration risk, medications were converted to IV. Need for manometry in [place] when stable from pulmonary/pneumonia standpoint, most likely early next week.  Today he seems quite agitated and aggravated.  He states he has been sleeping well.  States he has had issues with the staff and I recommended he follow-up with unit leadership.  He wants to go home, he wants to eat.  He is angry about not being able to eat.  He feels he would be "just fine" as long as he just does not eat  the wrong things.  States his father has had multiple dilations in the past.  States he has had a dilation previously with Dr. Karilyn Cota.  Denies overt significant GI symptoms at this time.  Discussed with the patient and his wife at length the high risk for aspiration pneumonia resulting in multiple recent admissions for pneumonia, given his essentially nonfunctioning esophagus.  His O2 sats in the past 24 hours have been 93%, 94%, and one episode of 99%.  He is not coughing currently and denies shortness of breath.  Current plans are for manometry in [place], likely early next week, hopefully Monday.  Possibility of Botox injections with Dr. Karilyn Cota upon his return on Tuesday, pending manometry results.  At this point we will keep him n.p.o.  I feel he is at risk to sign out AMA.  Plan: 1. Continue NPO 2. May have mouth swabs for comfort 3. IV hydration per hosptialist 4. Anticipate Manometry early next week for further workup, when respiratory status stable; [facility] is unlikely to perform "urgently" over the weekend 5. Supportive measures   Thank you for allowing [place] to participate in the care of Christian Ellison  Wynne Dust, DNP, AGNP-C Adult & Gerontological Nurse Practitioner [facility]     LOS: 1 day    06/10/2018, 8:35 AM

## 2018-06-10 NOTE — Progress Notes (Signed)
Pharmacy Antibiotic Note  Christian Ellison is a 56 y.o. male admitted on 06/09/2018 with HCAP.  Pharmacy has been consulted for Unasyn dosing.  Plan: Unasyn 3000 mg IV every 8 hours F/U cxs and clinical progress   Height: 6' (182.9 cm) Weight: 184 lb 15.5 oz (83.9 kg) IBW/kg (Calculated) : 77.6  Temp (24hrs), Avg:98.3 F (36.8 C), Min:97.8 F (36.6 C), Max:98.6 F (37 C)  Recent Labs  Lab 06/09/18 0216 06/09/18 0223 06/10/18 0529  WBC 9.2  --  4.6  CREATININE 0.81  --  0.53*  LATICACIDVEN  --  1.60  --     Estimated Creatinine Clearance: 113.2 mL/min (A) (by C-G formula based on SCr of 0.53 mg/dL (L)).    No Known Allergies  Antimicrobials this admission: Cefepime 10/24 >> 10/25 Vancomycin 10/24 >> 10/25 Unasyn 10/25 >>  Dose adjustments this admission: n/a  Microbiology results: 10/10 BCx: ngtd 10/10 MRSA PCR: negative  Thank you for allowing pharmacy to be a part of this patient's care.  Judeth Cornfield, PharmD Clinical Pharmacist 06/10/2018 9:35 AM

## 2018-06-11 DIAGNOSIS — R509 Fever, unspecified: Secondary | ICD-10-CM

## 2018-06-11 DIAGNOSIS — R000 Tachycardia, unspecified: Secondary | ICD-10-CM

## 2018-06-11 LAB — LEGIONELLA PNEUMOPHILA SEROGP 1 UR AG: L. pneumophila Serogp 1 Ur Ag: NEGATIVE

## 2018-06-11 MED ORDER — ADULT MULTIVITAMIN LIQUID CH
15.0000 mL | Freq: Every day | ORAL | Status: DC
  Administered 2018-06-11: 15 mL via ORAL
  Filled 2018-06-11 (×3): qty 15

## 2018-06-11 MED ORDER — AMOXICILLIN-POT CLAVULANATE 875-125 MG PO TABS: 1.0000 | ORAL_TABLET | Freq: Two times a day (BID) | ORAL | 0 refills | Status: AC

## 2018-06-11 MED ORDER — PANTOPRAZOLE SODIUM 40 MG PO TBEC: 40.0000 mg | DELAYED_RELEASE_TABLET | Freq: Every day | ORAL | Status: DC

## 2018-06-11 MED ORDER — LEVOTHYROXINE SODIUM 25 MCG PO TABS: 25.0000 ug | ORAL_TABLET | Freq: Every day | ORAL | Status: DC

## 2018-06-11 MED ORDER — AMOXICILLIN-POT CLAVULANATE 875-125 MG PO TABS: 1.0000 | ORAL_TABLET | Freq: Two times a day (BID) | ORAL | Status: DC

## 2018-06-11 MED ORDER — ENSURE ENLIVE PO LIQD
237.0000 mL | Freq: Three times a day (TID) | ORAL | Status: DC
  Administered 2018-06-11: 237 mL via ORAL

## 2018-06-11 MED ORDER — RANITIDINE HCL 150 MG PO CAPS: 150.0000 mg | ORAL_CAPSULE | Freq: Every evening | ORAL | Status: DC

## 2018-06-11 MED ORDER — PANTOPRAZOLE SODIUM 40 MG PO TBEC: 40.0000 mg | DELAYED_RELEASE_TABLET | Freq: Two times a day (BID) | ORAL | 1 refills | Status: DC

## 2018-06-11 NOTE — Progress Notes (Signed)
PROGRESS NOTE  Christian Ellison  ZOX:[id number]  DOB: [birth year]  DOA: 06/09/2018 PCP: Elfredia Nevins, MD  Brief Admission Hx: 57 year old male with known history of esophageal stricture and multiple admissions for aspiration pneumonia presented to the ED with fever and cough.  MDM/Assessment & Plan:   1. Sepsis secondary to recurrent aspiration pneumonia-treating with IV antibiotics.  De-escalated to IV unasyn. Continue supportive care and follow closely.  Clinically he is improving. 2. Hypotension-resolved with IV fluid hydration.  Continue IV fluid support. 3. Esophageal stricture-GI is consulted and they are very concerned about achalasia and I have recommended that the patient remain n.p.o. strictly until further evaluation.  I have a suspicion that the patient's partner has provided him with food in the room and I have advised against that.  He is very high risk for recurrent aspiration.  The patient verbalized understanding. 4. GERD-continue IV famotidine. 5. Hypothyroidism-continue levothyroxine daily. 6. Essential hypertension- blood pressures have been stable and controlled.  We are currently holding all oral medications.  Would use IV therapies if needed for blood pressure management. 7. Generalized anxiety disorder-IV lorazepam ordered as needed. 8. Pulmonary nodules- the patient would need repeat CT chest in 3 to 6 months for surveillance.  DVT prophylaxis: Lovenox/SCDs Code Status: FULL Family Communication: Patient and spouse at bedside Disposition Plan: Inpatient for IV therapies as he is currently strict n.p.o.  Consultants:  GI  Subjective: The patient has tolerated the full liquid diet.  He wants to go home.  He is waiting to discuss with GI.    Objective: Vitals:   06/10/18 0533 06/10/18 1355 06/10/18 2219 06/11/18 0712  BP: 127/71 109/78 130/76 120/64  Pulse: 60 (!) 53 (!) 56 (!) 51  Resp: 17 17 18 18   Temp: 98.5 F (36.9 C) 98.3 F (36.8 C) 98.2 F  (36.8 C) 98.7 F (37.1 C)  TempSrc: Oral  Oral Oral  SpO2: 94% 100% 96% 90%  Weight:      Height:        Intake/Output Summary (Last 24 hours) at 06/11/2018 1211 Last data filed at 06/11/2018 0500 Gross per 24 hour  Intake 1826.65 ml  Output -  Net 1826.65 ml   Filed Weights   06/09/18 0200  Weight: 83.9 kg   REVIEW OF SYSTEMS  As per history otherwise all reviewed and reported negative  Exam:  General exam: Awake, alert, no apparent distress, cooperative. Respiratory system: Rales left lower lobe. No increased work of breathing. Cardiovascular system: S1 & S2 heard, RRR. No JVD, murmurs, gallops, clicks or pedal edema. Gastrointestinal system: Abdomen is nondistended, soft and nontender. Normal bowel sounds heard. Central nervous system: Alert and oriented. No focal neurological deficits. Extremities: no CCE.  Data Reviewed: Basic Metabolic Panel: Recent Labs  Lab 06/09/18 0216 06/10/18 0529  NA 134* 141  K 3.9 3.9  CL 100 108  CO2 31 29  GLUCOSE 168* 98  BUN 15 6  CREATININE 0.81 0.53*  CALCIUM 8.4* 8.2*  MG  --  1.9  PHOS  --  2.8   Liver Function Tests: Recent Labs  Lab 06/09/18 0216 06/10/18 0529  AST 24  --   ALT 25  --   ALKPHOS 51  --   BILITOT 0.9  --   PROT 7.3  --   ALBUMIN 3.7 3.1*   No results for input(s): LIPASE, AMYLASE in the last 168 hours. No results for input(s): AMMONIA in the last 168 hours. CBC: Recent Labs  Lab 06/09/18 0216  06/10/18 0529  WBC 9.2 4.6  NEUTROABS 8.0* 2.8  HGB 12.2* 10.7*  HCT 38.5* 33.0*  MCV 93.7 92.7  PLT 158 142*   Cardiac Enzymes: No results for input(s): CKTOTAL, CKMB, CKMBINDEX, TROPONINI in the last 168 hours. CBG (last 3)  No results for input(s): GLUCAP in the last 72 hours. Recent Results (from the past 240 hour(s))  Culture, blood (Routine x 2)     Status: None (Preliminary result)   Collection Time: 06/09/18  2:02 AM  Result Value Ref Range Status   Specimen Description BLOOD LEFT  HAND  Final   Special Requests   Final    BOTTLES DRAWN AEROBIC AND ANAEROBIC Blood Culture adequate volume   Culture   Final    NO GROWTH 2 DAYS Performed at [facility], [address] [place], [place] [address]    Report Status PENDING  Incomplete  Culture, blood (Routine x 2)     Status: None (Preliminary result)   Collection Time: 06/09/18  2:20 AM  Result Value Ref Range Status   Specimen Description BLOOD RIGHT ANTECUBITAL  Final   Special Requests   Final    BOTTLES DRAWN AEROBIC AND ANAEROBIC Blood Culture adequate volume   Culture   Final    NO GROWTH 2 DAYS Performed at [facility], [address] [place], [place] [address]    Report Status PENDING  Incomplete  Respiratory Panel by PCR     Status: None   Collection Time: 06/09/18  5:17 AM  Result Value Ref Range Status   Adenovirus NOT DETECTED NOT DETECTED Final   Coronavirus 229E NOT DETECTED NOT DETECTED Final   Coronavirus HKU1 NOT DETECTED NOT DETECTED Final   Coronavirus NL63 NOT DETECTED NOT DETECTED Final   Coronavirus OC43 NOT DETECTED NOT DETECTED Final   Metapneumovirus NOT DETECTED NOT DETECTED Final   Rhinovirus / Enterovirus NOT DETECTED NOT DETECTED Final   Influenza A NOT DETECTED NOT DETECTED Final   Influenza B NOT DETECTED NOT DETECTED Final   Parainfluenza Virus 1 NOT DETECTED NOT DETECTED Final   Parainfluenza Virus 2 NOT DETECTED NOT DETECTED Final   Parainfluenza Virus 3 NOT DETECTED NOT DETECTED Final   Parainfluenza Virus 4 NOT DETECTED NOT DETECTED Final   Respiratory Syncytial Virus NOT DETECTED NOT DETECTED Final   Bordetella pertussis NOT DETECTED NOT DETECTED Final   Chlamydophila pneumoniae NOT DETECTED NOT DETECTED Final   Mycoplasma pneumoniae NOT DETECTED NOT DETECTED Final    Comment: Performed at [facility], [address], [place], [place] [address]    Studies: No results found. Scheduled Meds: . enoxaparin (LOVENOX) injection  40 mg Subcutaneous Q24H  . feeding  supplement (ENSURE ENLIVE)  237 mL Oral TID BM  . levothyroxine  12.5 mcg Intravenous Daily  . multivitamin  15 mL Oral Daily   Continuous Infusions: . sodium chloride 65 mL/hr at 06/10/18 2139  . ampicillin-sulbactam (UNASYN) IV 3 g (06/11/18 0548)  . famotidine (PEPCID) IV 20 mg (06/10/18 1038)   Principal Problem:   Sepsis (HCC) Active Problems:   Fever   Tachycardia   Hypoxia   Hypotension   Esophageal dysmotility   Recurrent aspiration pneumonia (HCC)  Time spent:   Standley Dakins, MD, FAAFP [facility] Pager [phone]  If 7PM-7AM, please contact night-coverage [email or web address] Password [email or web address] 06/11/2018, 12:11 PM    LOS: 2 days

## 2018-06-11 NOTE — Progress Notes (Signed)
IV discontinued,catheter intact. Discharge instructions given on medications and follow up visits,patient verbalized understanding. Prescriptions sent to Pharmacy of choice documented on AVS. Accompanied by staff to an awaiting vehicle. 

## 2018-06-11 NOTE — Discharge Summary (Signed)
Physician Discharge Summary  Christian Ellison ZOX:[id number] DOB: [birth year] DOA: 06/09/2018  PCP: Elfredia Nevins, MD GI: Rehman  Admit date: 06/09/2018 Discharge date: 06/11/2018  Admitted From: Home  Disposition: Home   Recommendations for Outpatient Follow-up:  Please seek medical care or return to ER if symptoms come back, worsen or don't improve.  Please follow up with Dr. Karilyn Cota to discuss further management. Remember to sit up for at least 2 hours after eating a meal and avoid laying flat during that time.   Please take your antibiotics to complete treatment for the pneumonia infection. You will be called on Monday about your manometry appointment in [place].  If you haven't heard anything please call Dr. Patty Sermons office.  Primary care provider to please schedule repeat CT chest done in 3-6 months to follow up pulmonary nodules.   Discharge Condition: STABLE   CODE STATUS: FULL   PT HAS INSISTED ON DISCHARGING HOME NOW, BUT SAYS THAT HE WILL MAKE SURE HE FOLLOWS UP WITH TAKING MEDICATIONS, FOLLOWING LIQUID DIET AND OUTPATIENT TESTING.  HE WAS COUNSELED THAT HE REMAINS HIGH RISK FOR RECURRENT ASPIRATION AND HE VERBALIZED UNDERSTANDING.    Brief Hospitalization Summary: Please see all hospital notes, images, labs for full details of the hospitalization.  HPI:  Dr. Claudean Severance HPI:  Christian Ellison  is a 57 y.o. male, w [name], Esophageal stricture, recent aspiration pneumonia apparently presents with fever this evening.  Pt has slight dry cough.  Possibly slight dyspnea.  Pt was brought by EMS for evaluation of fever.   Pt denies cp, palp, n/v, diarrhea, brbpr, black stool.  Pt notes that he ate shrimp alfredo this evening.  He is supposed to be on liquid diet.   In, Ed, T 100.6 P 112 Bp 97/55 currently on monitor. Pox 89% on RA  CT chest IMPRESSION: 1. No acute pulmonary embolism. 2. Tree-in-bud infiltrates may be infectious or inflammatory. 3. An incidental finding of  potential clinical significance has been found. Multiple subsolid pulmonary nodules, likely reactive given above findings though, recommend follow-up CT chest in 3-6 months. If stable, consider CT HEAD 2 and 4 years. This recommendation follows the consensus statement: Guidelines for Management of Incidental Pulmonary Nodules Detected on CT Images: From the Fleischner Society 2017; Radiology 2017; 284:228-243.  Na 134, K 3.9, Bun 15 Creatinine 0.81  Wbc 9.2, Hgb 12.2, Plt 158  INR 1.07   Lactic acid 1.60  Pt will be admitted for sepsis (fever, tachycardia, hypotension) due to aspiration ? Pneumonia.   Brief Admission Hx: 57 year old male with known history of esophageal stricture and multiple admissions for aspiration pneumonia presented to the ED with fever and cough.  MDM/Assessment & Plan:   1. Sepsis secondary to recurrent aspiration pneumonia-treated with IV antibiotics and transitioned over to oral augmentin.  Pt is tolerating and feeling better.  He is much improved and insisting that he be discharged home.  2. Hypotension-resolved with IV fluid hydration.   3. Esophageal stricture-Pt has been tolerating a full liquid diet.  He insists on discharging home.  He will be discharged because I have counseled him on the dangers of recurrent aspiration and he verbalized understanding. He will discharge on full liquid diet.  I discussed this with the GI team Lewie Loron and they also feel that it is reasonable if he discharges today and have outpatient follow up.  He was given strict precautions and follow up instructions. The [facility] office will call him on Monday with his appt for  outpatient manometry.   He is very high risk for recurrent aspiration.  The patient verbalized understanding. 4. GERD-protonix BID ordered. Anti-reflux foods diet information given, in addition to full liquid diet information.  5. Hypothyroidism-continue levothyroxine daily. 6. Essential hypertension-  blood pressures have been stable and controlled.   7. Pulmonary nodules- the patient needs repeat CT chest in 3 to 6 months for surveillance.  I have asked him to follow up with PCP to discuss this.    DVT prophylaxis: Lovenox/SCDs Code Status: FULL Family Communication: Patient and spouse at bedside Disposition Plan: Home   Consultants:  GI  Discharge Diagnoses:  Principal Problem:   Sepsis (HCC) Active Problems:   Fever   Tachycardia   Hypoxia   Hypotension   Esophageal dysmotility   Recurrent aspiration pneumonia [facility])  Discharge Instructions: Discharge Instructions    Call MD for:  difficulty breathing, headache or visual disturbances   Complete by:  As directed    Call MD for:  persistant dizziness or light-headedness   Complete by:  As directed    Call MD for:  persistant nausea and vomiting   Complete by:  As directed    Increase activity slowly   Complete by:  As directed      Allergies as of 06/11/2018   No Known Allergies     Medication List    STOP taking these medications   esomeprazole 40 MG capsule Commonly known as:  NEXIUM     TAKE these medications   albuterol 108 (90 Base) MCG/ACT inhaler Commonly known as:  PROVENTIL HFA;VENTOLIN HFA Inhale 2 puffs into the lungs every 6 (six) hours as needed for wheezing or shortness of breath.   ALPRAZolam 0.5 MG tablet Commonly known as:  XANAX Take 0.5 mg by mouth at bedtime.   amoxicillin-clavulanate 875-125 MG tablet Commonly known as:  AUGMENTIN Take 1 tablet by mouth every 12 (twelve) hours for 6 days.   atenolol 25 MG tablet Commonly known as:  TENORMIN Take 25 mg by mouth daily.   levothyroxine 25 MCG tablet Commonly known as:  SYNTHROID, LEVOTHROID Take 25 mcg by mouth daily.   multivitamin with minerals Tabs tablet Take 1 tablet by mouth daily. 50+   pantoprazole 40 MG tablet Commonly known as:  PROTONIX Take 1 tablet (40 mg total) by mouth 2 (two) times daily.   ranitidine 150  MG capsule Commonly known as:  ZANTAC Take 1 capsule (150 mg total) by mouth every evening. What changed:  when to take this      Follow-up Information    Rehman, Joline Maxcy, MD. Schedule an appointment as soon as possible for a visit in 3 day(s).   Specialty:  Gastroenterology Why:  hospital Follow Up  Contact information: [address], [address] [place] [address] [phone]        Elfredia Nevins, MD. Schedule an appointment as soon as possible for a visit in 1 week(s).   Specialty:  Internal Medicine Contact information: [address] [place] [address] [phone]          No Known Allergies Allergies as of 06/11/2018   No Known Allergies     Medication List    STOP taking these medications   esomeprazole 40 MG capsule Commonly known as:  NEXIUM     TAKE these medications   albuterol 108 (90 Base) MCG/ACT inhaler Commonly known as:  PROVENTIL HFA;VENTOLIN HFA Inhale 2 puffs into the lungs every 6 (six) hours as needed for  wheezing or shortness of breath.   ALPRAZolam 0.5 MG tablet Commonly known as:  XANAX Take 0.5 mg by mouth at bedtime.   amoxicillin-clavulanate 875-125 MG tablet Commonly known as:  AUGMENTIN Take 1 tablet by mouth every 12 (twelve) hours for 6 days.   atenolol 25 MG tablet Commonly known as:  TENORMIN Take 25 mg by mouth daily.   levothyroxine 25 MCG tablet Commonly known as:  SYNTHROID, LEVOTHROID Take 25 mcg by mouth daily.   multivitamin with minerals Tabs tablet Take 1 tablet by mouth daily. 50+   pantoprazole 40 MG tablet Commonly known as:  PROTONIX Take 1 tablet (40 mg total) by mouth 2 (two) times daily.   ranitidine 150 MG capsule Commonly known as:  ZANTAC Take 1 capsule (150 mg total) by mouth every evening. What changed:  when to take this       Procedures/Studies: Ct Angio Chest Pe W And/or Wo Contrast  Result Date: 06/09/2018 CLINICAL DATA:  Febrile, sepsis.  Assess for pulmonary  embolism. EXAM: CT ANGIOGRAPHY CHEST WITH CONTRAST TECHNIQUE: Multidetector CT imaging of the chest was performed using the standard protocol during bolus administration of intravenous contrast. Multiplanar CT image reconstructions and MIPs were obtained to evaluate the vascular anatomy. CONTRAST:  ISOVUE-370 IOPAMIDOL (ISOVUE-370) INJECTION 76% COMPARISON:  Chest radiograph June 09, 2018 FINDINGS: Cardiovascular: CARDIOVASCULAR: Adequate contrast opacification of the pulmonary artery's. Main pulmonary artery is not enlarged. No pulmonary arterial filling defects to the level of the subsegmental branches. Heart size is normal, no right heart strain. No pericardial effusion. Thoracic aorta is normal course and caliber, unremarkable. MEDIASTINUM/NODES: No lymphadenopathy by CT size criteria. Patulous esophagus. LUNGS/PLEURA: Tracheobronchial tree is patent, mild bronchial wall thickening. LEFT greater than RIGHT tree-in-bud infiltrates. Stable sub solid peripheral pulmonary nodules measuring to 5 mm. UPPER ABDOMEN: Non-acute. MUSCULOSKELETAL: Non-acute. Mild degenerative change of the thoracic spine. Review of the MIP images confirms the above findings. IMPRESSION: 1. No acute pulmonary embolism. 2. Tree-in-bud infiltrates may be infectious or inflammatory. 3. **An incidental finding of potential clinical significance has been found. Multiple subsolid pulmonary nodules, likely reactive given above findings though, recommend follow-up CT chest in 3-6 months. If stable, consider CT HEAD 2 and 4 years. This recommendation follows the consensus statement: Guidelines for Management of Incidental Pulmonary Nodules Detected on CT Images: From the Fleischner Society 2017; Radiology 2017; 284:228-243.** Electronically Signed   By: Awilda Metro M.D.   On: 06/09/2018 03:45   Dg Esophagus  Result Date: 05/27/2018 CLINICAL DATA:  Esophageal dysphagia.  Episodes of pneumonia. EXAM: ESOPHOGRAM/BARIUM SWALLOW  TECHNIQUE: Single contrast examination was performed using  thin barium. FLUOROSCOPY TIME:  Fluoroscopy Time:  1 minutes and 48 seconds Radiation Exposure Index (if provided by the fluoroscopic device): 24.9 mGy Number of Acquired Spot Images: 0 COMPARISON:  Prior study 01/06/2018 FINDINGS: Esophageal dysmotility with disruption of the primary peristaltic wave and tertiary contractions. Very little barium would pass into the stomach. Smooth strictured narrowing of the distal esophagus just above the GE junction similar to prior examination. IMPRESSION: Persistent smooth strictured narrowing of the distal esophagus with very little passage of barium into the stomach. Electronically Signed   By: Rudie Meyer M.D.   On: 05/27/2018 12:01   Dg Chest Port 1 View  Result Date: 06/09/2018 CLINICAL DATA:  Sepsis EXAM: PORTABLE CHEST 1 VIEW COMPARISON:  05/26/2018 FINDINGS: Previously seen right basilar opacity has improved. Heart is normal size. No confluent opacity currently. No effusions or acute bony abnormality. IMPRESSION:  Resolution of the previously seen right lower lobe pneumonia. No acute cardiopulmonary disease. Electronically Signed   By: Charlett Nose M.D.   On: 06/09/2018 02:32   Dg Chest Port 1 View  Result Date: 05/26/2018 CLINICAL DATA:  Fever, history of pneumonia EXAM: PORTABLE CHEST 1 VIEW COMPARISON:  Chest x-ray of 05/10/2017 and 01/02/2018 FINDINGS: There is now parenchymal opacity at the right lung base most consistent with right basilar pneumonia. Minimally prominent markings are present at the left lung base. No pleural effusion is seen. Heart size is stable. IMPRESSION: Right basilar opacity consistent with pneumonia. Electronically Signed   By: Dwyane Dee M.D.   On: 05/26/2018 12:47      Subjective: Pt insists on going home. He has tolerated a full liquid diet and tolerating oral meds.   Discharge Exam: Vitals:   06/11/18 0712 06/11/18 1326  BP: 120/64 131/69  Pulse: (!) 51 63   Resp: 18 19  Temp: 98.7 F (37.1 C) 98.6 F (37 C)  SpO2: 90% 97%   Vitals:   06/10/18 1355 06/10/18 2219 06/11/18 0712 06/11/18 1326  BP: 109/78 130/76 120/64 131/69  Pulse: (!) 53 (!) 56 (!) 51 63  Resp: 17 18 18 19   Temp: 98.3 F (36.8 C) 98.2 F (36.8 C) 98.7 F (37.1 C) 98.6 F (37 C)  TempSrc:  Oral Oral Oral  SpO2: 100% 96% 90% 97%  Weight:      Height:       General exam: Awake, alert, no apparent distress, cooperative. Respiratory system: fine rales left lower lobe. No increased work of breathing. Cardiovascular system: S1 & S2 heard, RRR. No JVD, murmurs, gallops, clicks or pedal edema. Gastrointestinal system: Abdomen is nondistended, soft and nontender. Normal bowel sounds heard. Central nervous system: Alert and oriented. No focal neurological deficits. Extremities: no CCE.   The results of significant diagnostics from this hospitalization (including imaging, microbiology, ancillary and laboratory) are listed below for reference.     Microbiology: Recent Results (from the past 240 hour(s))  Culture, blood (Routine x 2)     Status: None (Preliminary result)   Collection Time: 06/09/18  2:02 AM  Result Value Ref Range Status   Specimen Description BLOOD LEFT HAND  Final   Special Requests   Final    BOTTLES DRAWN AEROBIC AND ANAEROBIC Blood Culture adequate volume   Culture   Final    NO GROWTH 2 DAYS Performed at [facility], [address], [place], [place] [address]    Report Status PENDING  Incomplete  Culture, blood (Routine x 2)     Status: None (Preliminary result)   Collection Time: 06/09/18  2:20 AM  Result Value Ref Range Status   Specimen Description BLOOD RIGHT ANTECUBITAL  Final   Special Requests   Final    BOTTLES DRAWN AEROBIC AND ANAEROBIC Blood Culture adequate volume   Culture   Final    NO GROWTH 2 DAYS Performed at [facility], [address] [place], [place] [address]    Report Status PENDING  Incomplete  Respiratory Panel by  PCR     Status: None   Collection Time: 06/09/18  5:17 AM  Result Value Ref Range Status   Adenovirus NOT DETECTED NOT DETECTED Final   Coronavirus 229E NOT DETECTED NOT DETECTED Final   Coronavirus HKU1 NOT DETECTED NOT DETECTED Final   Coronavirus NL63 NOT DETECTED NOT DETECTED Final   Coronavirus OC43 NOT DETECTED NOT DETECTED Final   Metapneumovirus NOT DETECTED NOT DETECTED  Final   Rhinovirus / Enterovirus NOT DETECTED NOT DETECTED Final   Influenza A NOT DETECTED NOT DETECTED Final   Influenza B NOT DETECTED NOT DETECTED Final   Parainfluenza Virus 1 NOT DETECTED NOT DETECTED Final   Parainfluenza Virus 2 NOT DETECTED NOT DETECTED Final   Parainfluenza Virus 3 NOT DETECTED NOT DETECTED Final   Parainfluenza Virus 4 NOT DETECTED NOT DETECTED Final   Respiratory Syncytial Virus NOT DETECTED NOT DETECTED Final   Bordetella pertussis NOT DETECTED NOT DETECTED Final   Chlamydophila pneumoniae NOT DETECTED NOT DETECTED Final   Mycoplasma pneumoniae NOT DETECTED NOT DETECTED Final    Comment: Performed at [facility], [address], [place], [place] [address]     Labs: BNP (last 3 results) Recent Labs    01/02/18 0209 02/02/18 1355 02/03/18 0507  BNP 310.8* 238.0* 92.0   Basic Metabolic Panel: Recent Labs  Lab 06/09/18 0216 06/10/18 0529  NA 134* 141  K 3.9 3.9  CL 100 108  CO2 31 29  GLUCOSE 168* 98  BUN 15 6  CREATININE 0.81 0.53*  CALCIUM 8.4* 8.2*  MG  --  1.9  PHOS  --  2.8   Liver Function Tests: Recent Labs  Lab 06/09/18 0216 06/10/18 0529  AST 24  --   ALT 25  --   ALKPHOS 51  --   BILITOT 0.9  --   PROT 7.3  --   ALBUMIN 3.7 3.1*   No results for input(s): LIPASE, AMYLASE in the last 168 hours. No results for input(s): AMMONIA in the last 168 hours. CBC: Recent Labs  Lab 06/09/18 0216 06/10/18 0529  WBC 9.2 4.6  NEUTROABS 8.0* 2.8  HGB 12.2* 10.7*  HCT 38.5* 33.0*  MCV 93.7 92.7  PLT 158 142*   Cardiac Enzymes: No results for  input(s): CKTOTAL, CKMB, CKMBINDEX, TROPONINI in the last 168 hours. BNP: Invalid input(s): POCBNP CBG: No results for input(s): GLUCAP in the last 168 hours. D-Dimer No results for input(s): DDIMER in the last 72 hours. Hgb A1c No results for input(s): HGBA1C in the last 72 hours. Lipid Profile No results for input(s): CHOL, HDL, LDLCALC, TRIG, CHOLHDL, LDLDIRECT in the last 72 hours. Thyroid function studies No results for input(s): TSH, T4TOTAL, T3FREE, THYROIDAB in the last 72 hours.  Invalid input(s): FREET3 Anemia work up No results for input(s): VITAMINB12, FOLATE, FERRITIN, TIBC, IRON, RETICCTPCT in the last 72 hours. Urinalysis    Component Value Date/Time   COLORURINE YELLOW 06/09/2018 1130   APPEARANCEUR CLEAR 06/09/2018 1130   LABSPEC 1.021 06/09/2018 1130   PHURINE 6.0 06/09/2018 1130   GLUCOSEU NEGATIVE 06/09/2018 1130   HGBUR NEGATIVE 06/09/2018 1130   BILIRUBINUR NEGATIVE 06/09/2018 1130   KETONESUR NEGATIVE 06/09/2018 1130   PROTEINUR NEGATIVE 06/09/2018 1130   NITRITE NEGATIVE 06/09/2018 1130   LEUKOCYTESUR NEGATIVE 06/09/2018 1130   Sepsis Labs Invalid input(s): PROCALCITONIN,  WBC,  LACTICIDVEN Microbiology Recent Results (from the past 240 hour(s))  Culture, blood (Routine x 2)     Status: None (Preliminary result)   Collection Time: 06/09/18  2:02 AM  Result Value Ref Range Status   Specimen Description BLOOD LEFT HAND  Final   Special Requests   Final    BOTTLES DRAWN AEROBIC AND ANAEROBIC Blood Culture adequate volume   Culture   Final    NO GROWTH 2 DAYS Performed at [facility], [address] [place], [place] [address]    Report Status PENDING  Incomplete  Culture, blood (Routine x 2)     Status: None (Preliminary result)   Collection Time: 06/09/18  2:20 AM  Result Value Ref Range Status   Specimen Description BLOOD RIGHT ANTECUBITAL  Final   Special Requests   Final    BOTTLES DRAWN AEROBIC AND ANAEROBIC Blood Culture adequate volume    Culture   Final    NO GROWTH 2 DAYS Performed at [facility], [address], [place], [place] [address]    Report Status PENDING  Incomplete  Respiratory Panel by PCR     Status: None   Collection Time: 06/09/18  5:17 AM  Result Value Ref Range Status   Adenovirus NOT DETECTED NOT DETECTED Final   Coronavirus 229E NOT DETECTED NOT DETECTED Final   Coronavirus HKU1 NOT DETECTED NOT DETECTED Final   Coronavirus NL63 NOT DETECTED NOT DETECTED Final   Coronavirus OC43 NOT DETECTED NOT DETECTED Final   Metapneumovirus NOT DETECTED NOT DETECTED Final   Rhinovirus / Enterovirus NOT DETECTED NOT DETECTED Final   Influenza A NOT DETECTED NOT DETECTED Final   Influenza B NOT DETECTED NOT DETECTED Final   Parainfluenza Virus 1 NOT DETECTED NOT DETECTED Final   Parainfluenza Virus 2 NOT DETECTED NOT DETECTED Final   Parainfluenza Virus 3 NOT DETECTED NOT DETECTED Final   Parainfluenza Virus 4 NOT DETECTED NOT DETECTED Final   Respiratory Syncytial Virus NOT DETECTED NOT DETECTED Final   Bordetella pertussis NOT DETECTED NOT DETECTED Final   Chlamydophila pneumoniae NOT DETECTED NOT DETECTED Final   Mycoplasma pneumoniae NOT DETECTED NOT DETECTED Final    Comment: Performed at [facility], [address] [place], [place] [address]   Time coordinating discharge: 34 mins  SIGNED:  Standley Dakins, MD  [facility] 06/11/2018, 1:26 PM Pager [phone]  If 7PM-7AM, please contact night-coverage [email or web address] Password [email or web address]

## 2018-06-11 NOTE — Progress Notes (Signed)
    Subjective: No abdominal pain. Tolerating full liquids. States he has a bad habit of eating then laying down. Realizes he can't do that. States would lay down and regurgitate in the past.   Objective: Vital signs in last 24 hours: Temp:  [98.2 F (36.8 C)-98.7 F (37.1 C)] 98.7 F (37.1 C) (10/26 0712) Pulse Rate:  [51-56] 51 (10/26 0712) Resp:  [17-18] 18 (10/26 0712) BP: (109-130)/(64-78) 120/64 (10/26 0712) SpO2:  [90 %-100 %] 90 % (10/26 0712) Last BM Date: 06/08/18 General:   Alert and oriented, pleasant Head:  Normocephalic and atraumatic. Abdomen:  Bowel sounds present, soft, non-tender, non-distended. No HSM or hernias noted. No rebound or guarding. No masses appreciated  Neurologic:  Alert and  oriented x4 Psych:  Alert and cooperative. Normal mood and affect.  Intake/Output from previous day: 10/25 0701 - 10/26 0700 In: 1826.7 [P.O.:120; I.V.:1456.7; IV Piggyback:250] Out: -  Intake/Output this shift: No intake/output data recorded.  Lab Results: Recent Labs    06/09/18 0216 06/10/18 0529  WBC 9.2 4.6  HGB 12.2* 10.7*  HCT 38.5* 33.0*  PLT 158 142*   BMET Recent Labs    06/09/18 0216 06/10/18 0529  NA 134* 141  K 3.9 3.9  CL 100 108  CO2 31 29  GLUCOSE 168* 98  BUN 15 6  CREATININE 0.81 0.53*  CALCIUM 8.4* 8.2*   LFT Recent Labs    06/09/18 0216 06/10/18 0529  PROT 7.3  --   ALBUMIN 3.7 3.1*  AST 24  --   ALT 25  --   ALKPHOS 51  --   BILITOT 0.9  --    PT/INR Recent Labs    06/09/18 0216  LABPROT 13.8  INR 1.07    Assessment: 57 year old male with multiple hospitalizations previously secondary to acute hypoxia/respiratory issues/pneumonia.  Suspected aspiration pneumonia on previous occasions.  Speech therapy evaluation without any overt aspiration concerns.  Barium esophagram back in May with smooth mild stricture in the distal esophagus, tablet would not pass.  EGD in August with benign-appearing stricture status post  dilation.  Follow-up barium esophagram in October showed esophageal dysmotility with disruption of the primary peristaltic wave and tertiary contractions, very little barium was passed into the stomach, smooth stricture narrowing of the distal esophagus just above the GE junction similar to prior examination back in May. Started on full liquids yesterday evening and tolerating. Interestingly, patient tells me he has a "bad habit" of eating and then laying down at home. We discussed in detail aspiration precautions. He is eager to go home, tolerating full liquid diet that was started yesterday.   Plans for esophageal manometry in process already by Dr. Patty Sermons office as outpatient at [facility]; however, patient does not want to go to [facility]. No appointment that I am aware of. Discussed with patient that on Monday we could arrange manometry in [place], but I highly doubt it would happen Monday and more likely Tuesday at the earliest. If improved from a respiratory standpoint, may need to consider discharge in next 24 hours with strict full liquids and aspiration precautions, sitting upright, with manometry arranged early next week. Will discuss with hospitalist.  Christian Mink, PhD, ANP-BC [facility]      LOS: 2 days    06/11/2018, 11:21 AM

## 2018-06-11 NOTE — Discharge Instructions (Signed)
Please seek medical care or return to ER if symptoms come back, worsen or don't improve.  Please follow up with Dr. Karilyn Cota to discuss further management. Remember to sit up for at least 2 hours after eating a meal and avoid laying flat during that time.   Please take your antibiotics to complete treatment for the pneumonia infection. You will be called on Monday about your manometry appointment in [place].  If you haven't heard anything please call Dr. Patty Sermons office.  PLEASE SPEAK WITH YOUR PRIMARY CARE DOCTOR ABOUT REPEATING CT SCAN IN 3-6 MONTHS ABOUT PULMONARY NODULES SEEN ON CT SCAN.          Full Liquid Diet A full liquid diet may be used:  To help you transition from a clear liquid diet to a soft diet.  When your body is healing and can only tolerate foods that are easy to digest.  Before or after certain a procedure, test, or surgery (such as stomach or intestinal surgeries).  If you have trouble swallowing or chewing.  A full liquid diet includes fluids and foods that are liquid or will become liquid at room temperature. The full liquid diet gives you the proteins, fluids, salts, and minerals that you need for energy. If you continue this diet for more than 72 hours, talk to your health care provider about how many calories you need to consume. If you continue the diet for more than 5 days, talk to your health care provider about taking a multivitamin or a nutritional supplement. What do I need to know about a full liquid diet?  You may have any liquid.  You may have any food that becomes a liquid at room temperature. The food is considered a liquid if it can be poured off a spoon at room temperature.  Drink one serving of citrus or vitamin C-enriched fruit juice daily. What foods can I eat? Grains Any grain food that can be pureed in soup (such as crackers, pasta, and rice). Hot cereal (such as farina or oatmeal) that has been blended. Talk to your health care provider  or dietitian about these foods. Vegetables Pulp-free tomato or vegetable juice. Vegetables pureed in soup. Fruits Fruit juice, including nectars and juices with pulp. Meats and Other Protein Sources Eggs in custard, eggnog mix, and eggs used in ice cream or pudding. Strained meats, like in baby food, may be allowed. Consult your health care provider. Dairy Milk and milk-based beverages, including milk shakes and instant breakfast mixes. Smooth yogurt. Pureed cottage cheese. Avoid these foods if they are not well tolerated. Beverages All beverages, including liquid nutritional supplements. Ask your health care provider if you can have carbonated beverages. They may not be well tolerated. Condiments Iodized salt, pepper, spices, and flavorings. Cocoa powder. Vinegar, ketchup, yellow mustard, smooth sauces (such as hollandaise, cheese sauce, or white sauce), and soy sauce. Sweets and Desserts Custard, smooth pudding. Flavored gelatin. Tapioca, junket. Plain ice cream, sherbet, fruit ices. Frozen ice pops, frozen fudge pops, pudding pops, and other frozen bars with cream. Syrups, including chocolate syrup. Sugar, honey, jelly. Fats and Oils Margarine, butter, cream, sour cream, and oils. Other Broth and cream soups. Strained, broth-based soups. The items listed above may not be a complete list of recommended foods or beverages. Contact your dietitian for more options. What foods can I not eat? Grains All breads. Grains are not allowed unless they are pureed into soup. Vegetables Vegetables are not allowed unless they are juiced, or cooked and pureed  into soup. Fruits Fruits are not allowed unless they are juiced. Meats and Other Protein Sources Any meat or fish. Cooked or raw eggs. Nut butters. Dairy Cheese. Condiments Stone ground mustards. Fats and Oils Fats that are coarse or chunky. Sweets and Desserts Ice cream or other frozen desserts that have any solids in them or on top, such  as nuts, chocolate chips, and pieces of cookies. Cakes. Cookies. Candy. Others Soups with chunks or pieces in them. The items listed above may not be a complete list of foods and beverages to avoid. Contact your dietitian for more information. This information is not intended to replace advice given to you by your health care provider. Make sure you discuss any questions you have with your health care provider. Document Released: 08/03/2005 Document Revised: 01/09/2016 Document Reviewed: 06/08/2013 Elsevier Interactive Patient Education  2017 [employer].    Food Choices for Gastroesophageal Reflux Disease, Adult When you have gastroesophageal reflux disease (GERD), the foods you eat and your eating habits are very important. Choosing the right foods can help ease your discomfort. What guidelines do I need to follow?  Choose fruits, vegetables, whole grains, and low-fat dairy products.  Choose low-fat meat, fish, and poultry.  Limit fats such as oils, salad dressings, butter, nuts, and avocado.  Keep a food diary. This helps you identify foods that cause symptoms.  Avoid foods that cause symptoms. These may be different for everyone.  Eat small meals often instead of 3 large meals a day.  Eat your meals slowly, in a place where you are relaxed.  Limit fried foods.  Cook foods using methods other than frying.  Avoid drinking alcohol.  Avoid drinking large amounts of liquids with your meals.  Avoid bending over or lying down until 2-3 hours after eating. What foods are not recommended? These are some foods and drinks that may make your symptoms worse: Vegetables Tomatoes. Tomato juice. Tomato and spaghetti sauce. Chili peppers. Onion and garlic. Horseradish. Fruits Oranges, grapefruit, and lemon (fruit and juice). Meats High-fat meats, fish, and poultry. This includes hot dogs, ribs, ham, sausage, salami, and bacon. Dairy Whole milk and chocolate milk. Sour cream. Cream.  Butter. Ice cream. Cream cheese. Drinks Coffee and tea. Bubbly (carbonated) drinks or energy drinks. Condiments Hot sauce. Barbecue sauce. Sweets/Desserts Chocolate and cocoa. Donuts. Peppermint and spearmint. Fats and Oils High-fat foods. This includes [place] fries and potato chips. Other Vinegar. Strong spices. This includes black pepper, white pepper, red pepper, cayenne, curry powder, cloves, ginger, and chili powder. The items listed above may not be a complete list of foods and drinks to avoid. Contact your dietitian for more information. This information is not intended to replace advice given to you by your health care provider. Make sure you discuss any questions you have with your health care provider. Document Released: 02/02/2012 Document Revised: 01/09/2016 Document Reviewed: 06/07/2013 Elsevier Interactive Patient Education  2017 [employer] Inc[employer]     Aspiration Pneumonia Aspiration pneumonia is an infection in your lungs. It occurs when food, liquid, or stomach contents (vomit) are inhaled (aspirated) into your lungs. When these things get into your lungs, swelling (inflammation) and infection can occur. This can make it difficult for you to breathe. Aspiration pneumonia is a serious condition and can be life threatening. What increases the risk? Aspiration pneumonia is more likely to occur when a person's cough (gag) reflex or ability to swallow has been decreased. Some things that can do this include:  Having a brain injury or  disease, such as stroke, seizures, Parkinson's disease, dementia, or amyotrophic lateral sclerosis (ALS).  Being given general anesthetic for procedures.  Being in a coma (unconscious).  Having a narrowing of the tube that carries food to the stomach (esophagus).  Drinking too much alcohol. If a person passes out and vomits, vomit can be swallowed into the lungs.  Taking certain medicines, such as tranquilizers or sedatives.  What are the signs  or symptoms?  Coughing after swallowing food or liquids.  Breathing problems, such as wheezing or shortness of breath.  Bluish skin. This can be caused by lack of oxygen.  Coughing up food or mucus. The mucus might contain blood, greenish material, or yellowish-white fluid (pus).  Fever.  Chest pain.  Being more tired than usual (fatigue).  Sweating more than usual.  Bad breath. How is this diagnosed? A physical exam will be done. During the exam, the health care provider will listen to your lungs with a stethoscope to check for:  Crackling sounds in the lungs.  Decreased breath sounds.  A rapid heartbeat.  Various tests may be ordered. These may include:  Chest X-ray.  CT scan.  Swallowing study. This test looks at how food is swallowed and whether it goes into your breathing tube (trachea) or food pipe (esophagus).  Sputum culture. Saliva and mucus (sputum) are collected from the lungs or the tubes that carry air to the lungs (bronchi). The sputum is then tested for bacteria.  Bronchoscopy. This test uses a flexible tube (bronchoscope) to see inside the lungs.  How is this treated? Treatment will usually include antibiotic medicines. Other medicines may also be used to reduce fever or pain. You may need to be treated in the hospital. In the hospital, your breathing will be carefully monitored. Depending on how well you are breathing, you may need to be given oxygen, or you may need breathing support from a breathing machine (ventilator). For people who fail a swallowing study, a feeding tube might be placed in the stomach, or they may be asked to avoid certain food textures or liquids when they eat. Follow these instructions at home:  Carefully follow any special eating instructions you were given, such as avoiding certain food textures or thickening liquids. This reduces the risk of developing aspiration pneumonia again.  Only take over-the-counter or prescription  medicines as directed by your health care provider. Follow the directions carefully.  If you were prescribed antibiotics, take them as directed. Finish them even if you start to feel better.  Rest as instructed by your health care provider.  Keep all follow-up appointments with your health care provider. Contact a health care provider if:  You develop worsening shortness of breath, wheezing, or difficulty breathing.  You develop a fever.  You have chest pain. This information is not intended to replace advice given to you by your health care provider. Make sure you discuss any questions you have with your health care provider. Document Released: 05/31/2009 Document Revised: 01/15/2016 Document Reviewed: 01/19/2013 Elsevier Interactive Patient Education  2017 Elsevier Inc.   Antibiotic Medicine, Adult Antibiotic medicines treat infections caused by a type of germ called bacteria. They work by killing the bacteria that make you sick. When do I need to take antibiotics? You often need these medicines to treat bacterial infections, such as:  A urinary tract infection (UTI).  Strep throat.  Meningitis. This affects the spinal cord and brain.  A bad lung infection.  You may start the medicines while your  doctor waits for tests to come back. When the tests come back, your doctor may change or stop your medicine. When are antibiotics not needed? You do not need these medicines for most common illnesses, such as:  A cold.  The flu.  A sore throat.  Antibiotics are not always needed for all infections caused by bacteria. Do not ask for these medicines, or take them, when they are not needed. What are the risks of taking antibiotics? Most antibiotics can cause an infection called Clostridium difficile.This causes watery poop (diarrhea). Let your doctor know right away if:  You have watery poop while taking an antibiotic.  You have watery poop after you stop taking an antibiotic.  The illness can happen weeks after you stop the medicine.  You also have a risk of getting an infection in the future that antibiotics cannot treat (antibiotic-resistant infection). This type of infection can be dangerous. What else should I know about taking antibiotics?  You need to take the entire prescription. ? Take the medicine for as long as told by your doctor. ? Do not stop taking it even if you start to feel better.  Try not to miss any doses. If you miss a dose, call your doctor.  Birth control pills may not work. If you take birth control pills: ? Keep on taking them. ? Use a second form of birth control, such as a condom. Do this for as long as told by your doctor.  Ask your doctor: ? How long to wait in between doses. ? If you should take the medicine with food. ? If there is anything you should stay away from while taking the antibiotic, such as: ? Food. ? Drinks. ? Medicines. ? If there are any side effects you should watch for.  Only take the medicines that your doctor told you to take. Do not take medicines that were given to someone else.  Drink a large glass of water with the medicine.  Ask the pharmacist for a tool to measure the medicine, such as: ? A syringe. ? A cup. ? A spoon.  Throw away any extra medicine. Contact a doctor if:  You get worse.  You have new joint pain or muscle aches after starting the medicine.  You have side effects from the medicine, such as: ? Stomach pain. ? Watery poop. ? Feeling sick to your stomach (nausea). Get help right away if:  You have signs of a very bad allergic reaction. If this happens, stop taking the medicine right away. Signs may include: ? Hives. These are raised, itchy, red bumps on the skin. ? Skin rash. ? Trouble breathing. ? Wheezing. ? Swelling. ? Feeling dizzy. ? Throwing up (vomiting).  Your pee (urine) is dark, or is the color of blood.  Your skin turns yellow.  You bruise  easily.  You bleed easily.  You have very bad watery poop and cramps in your belly.  You have a very bad headache. Summary  Antibiotics are often used to treat infections caused by bacteria.  Only take these medicines when needed.  Let your doctor know if you have watery poop while taking an antibiotic.  You need to take the entire prescription. This information is not intended to replace advice given to you by your health care provider. Make sure you discuss any questions you have with your health care provider. Document Released: 05/12/2008 Document Revised: 08/05/2016 Document Reviewed: 08/05/2016 Elsevier Interactive Patient Education  2017 [employer].

## 2018-06-11 NOTE — Progress Notes (Signed)
Initial Nutrition Assessment  DOCUMENTATION CODES:  Not applicable  INTERVENTION:  For handout on list of foods appropriate for full liquid diet:  1. Open Epic, Click "Web Links" on top 2. Go to nutrition->nutrition care manual 3.-Click Client Ed Diets on top of page 4. Click gastrointestinal on L side page 5. Choose "Full liquid Diet Nutrition Therapy"  6. Click Download Client Ed under header 7. Click "All content", then Download / print  Ensure Enlive po TID, each supplement provides 350 kcal and 20 grams of protein   Liquid mvi with minerals  NUTRITION DIAGNOSIS:  Altered GI function related to dysphagia(Esophageal dysmotility) as evidenced by Requirement that pt remain on FL diet.  GOAL:  Patient will meet greater than or equal to 90% of their needs  MONITOR:  PO intake, Supplement acceptance, Diet advancement, Labs, Weight trends  REASON FOR ASSESSMENT:  Consult Diet education  ASSESSMENT:  57 y/o Anxiety, Chronic pain, GERD, Hepatitis, Opiate Abuse, esophageal stricture, esophageal dysmotility, recurrent aspiration PNA presents w/ fever after eating shrimp alfredo (supposed to be on liquid diet). Admitted for sepsis suspected secondary to aspiration PNA.   RD consulted to help patient meet nutrition needs on a FL diet. RD not on site today. Will leave instructions at top for nursing to provide education material if desired.   Pt has been NPO since admit. Unsure of current level of intake, though per notes, patient has reported being hungry on several occasions  Weight wise, pt doesn't appear to have been weighed this admission as of yet, though despite recurrent illness his weight appears to have been stable between 180-185 lbs for 5 years now.   Will order Ensure Enlive TID to help meet needs.   Labs: Albumin: 3.1, WBC:WDl. H/H:10.7/33.0 Meds: IVF, IV abx, IV h2ra  Recent Labs  Lab 06/09/18 0216 06/10/18 0529  NA 134* 141  K 3.9 3.9  CL 100 108  CO2 31 29   BUN 15 6  CREATININE 0.81 0.53*  CALCIUM 8.4* 8.2*  MG  --  1.9  PHOS  --  2.8  GLUCOSE 168* 98   NUTRITION - FOCUSED PHYSICAL EXAM: Unable to perform  Diet Order:   Diet Order            Diet full liquid Room service appropriate? Yes; Fluid consistency: Thin  Diet effective now             EDUCATION NEEDS:  Not appropriate for education at this time  Skin: Abrasions to bilateral legs  Last BM:  10/23  Height:  Ht Readings from Last 1 Encounters:  06/09/18 6' (1.829 m)   Weight:  Wt Readings from Last 1 Encounters:  06/09/18 83.9 kg   Wt Readings from Last 10 Encounters:  06/09/18 83.9 kg  05/26/18 83.9 kg  03/10/18 82.1 kg  02/22/18 84.3 kg  02/08/18 83 kg  02/03/18 81 kg  01/06/18 80.4 kg  11/22/13 83 kg  10/25/13 83 kg  07/03/13 82.1 kg   Ideal Body Weight:  80.91 kg  BMI:  Body mass index is 25.09 kg/m.  Estimated Nutritional Needs:  Kcal:  2100-2250 (25-27 kcal/kg bw) Protein:  85-100g Pro (1-1.2g/kg bw) Fluid:  2.1-2.3 L fluid (1 ml/kcal)  Christophe Louis RD, LDN, CNSC Clinical Nutrition Available Tues-Sat via Pager: [phone] 06/11/2018 9:11 AM

## 2018-06-13 ENCOUNTER — Telehealth: Payer: Self-pay | Admitting: Gastroenterology

## 2018-06-13 ENCOUNTER — Telehealth (INDEPENDENT_AMBULATORY_CARE_PROVIDER_SITE_OTHER): Payer: Self-pay | Admitting: *Deleted

## 2018-06-13 NOTE — Telephone Encounter (Signed)
Please check with endoscopy unit if they can do it on any other day than M-W-F to get him in sooner? I am cc'ing Clance Boll as well to see if she can help with scheduling. Thanks

## 2018-06-13 NOTE — Telephone Encounter (Signed)
Patient does not want manometry at [facility]. He was recently discharged from the hospital. High concern for achalasia. Only able to tolerate full liquids. Can we arrange manometry in [place] this week or in very near future? Thanks!

## 2018-06-13 NOTE — Telephone Encounter (Signed)
Note sent to [facility] [place] -- she schedules manometry

## 2018-06-13 NOTE — Telephone Encounter (Signed)
First available is 06/29/18.  I have put him on this spot to keep it from being pushed out any further. This is 3 weeks away. Please advise. Thanks

## 2018-06-13 NOTE — Telephone Encounter (Signed)
Dr Karilyn Cota would like patient to have esophageal manometry, this week -- he has been in hospital twice on October for aspiration pneumonia and High concern for achalasia. Only able to tolerate full liquids -- all info in [facility]

## 2018-06-14 LAB — CULTURE, BLOOD (ROUTINE X 2)
CULTURE: NO GROWTH
Culture: NO GROWTH
Special Requests: ADEQUATE
Special Requests: ADEQUATE

## 2018-06-14 NOTE — Telephone Encounter (Signed)
Patient is scheduled for 06/15/18. Arrive at 2:15 pm to [facility] for a 2:30 pm appointment for esophageal manometry. Instructed to fast for 6 hours prior. No pain meds, muscle relaxer or anti-anxiety medications 12 hours before. He takes Xanax for sleep. Okay to take HTN, PPI and thyroid medication spaced appropriately.

## 2018-06-15 ENCOUNTER — Encounter (HOSPITAL_COMMUNITY)
Admission: RE | Disposition: A | Discharge status: Home / Self Care | Payer: Self-pay | Source: Ambulatory Visit | Attending: Gastroenterology

## 2018-06-15 ENCOUNTER — Ambulatory Visit (HOSPITAL_COMMUNITY)
Admission: RE | Admit: 2018-06-15 | Discharge: 2018-06-15 | Disposition: A | Discharge status: Home / Self Care | Payer: Self-pay | Source: Ambulatory Visit | Attending: Gastroenterology | Admitting: Gastroenterology

## 2018-06-15 DIAGNOSIS — J690 Pneumonitis due to inhalation of food and vomit: Secondary | ICD-10-CM | POA: Insufficient documentation

## 2018-06-15 DIAGNOSIS — K219 Gastro-esophageal reflux disease without esophagitis: Secondary | ICD-10-CM | POA: Insufficient documentation

## 2018-06-15 DIAGNOSIS — R1310 Dysphagia, unspecified: Secondary | ICD-10-CM | POA: Insufficient documentation

## 2018-06-15 HISTORY — PX: ESOPHAGEAL MANOMETRY: SHX5429

## 2018-06-15 SURGERY — MANOMETRY, ESOPHAGUS

## 2018-06-15 MED ORDER — LIDOCAINE VISCOUS HCL 2 % MT SOLN
OROMUCOSAL | Status: AC
  Filled 2018-06-15: qty 15

## 2018-06-15 SURGICAL SUPPLY — 2 items
FACESHIELD LNG OPTICON STERILE (SAFETY) IMPLANT
GLOVE BIO SURGEON STRL SZ8 (GLOVE) ×4 IMPLANT

## 2018-06-15 NOTE — Progress Notes (Signed)
Esophageal manometry preformed per protocol.  Patient tolerated procedure without complications.  Dr. Nandigam to interpret results. 

## 2018-06-16 ENCOUNTER — Encounter (HOSPITAL_COMMUNITY): Payer: Self-pay | Admitting: Gastroenterology

## 2018-07-05 ENCOUNTER — Telehealth (INDEPENDENT_AMBULATORY_CARE_PROVIDER_SITE_OTHER): Payer: Self-pay | Admitting: *Deleted

## 2018-07-05 NOTE — Telephone Encounter (Signed)
Patient called wanting results of manometry -- report is under procedures tab  Please call [phone]

## 2018-07-06 ENCOUNTER — Other Ambulatory Visit (INDEPENDENT_AMBULATORY_CARE_PROVIDER_SITE_OTHER): Payer: Self-pay | Admitting: Internal Medicine

## 2018-07-06 MED ORDER — DILTIAZEM HCL 30 MG PO TABS: 30.0000 mg | ORAL_TABLET | Freq: Three times a day (TID) | ORAL | 0 refills | Status: DC

## 2018-07-06 NOTE — Telephone Encounter (Signed)
See other note

## 2018-07-06 NOTE — Progress Notes (Signed)
Esophageal manometry results given to patient's fianc Amanda. 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 Criterion for achalasia not met. Can decrease atenolol to 12.5 mg daily. Will begin Cardizem 30 mg by mouth 30 minutes before each meal. She will call office with progress report in 2 weeks or earlier if he has low blood pressure issues. Office visit in 1 month. Send reports to PCP.

## 2018-08-22 ENCOUNTER — Ambulatory Visit (HOSPITAL_COMMUNITY)
Admission: EM | Admit: 2018-08-22 | Discharge: 2018-08-22 | Disposition: A | Discharge status: Home / Self Care | Payer: Self-pay | Attending: Internal Medicine | Admitting: Internal Medicine

## 2018-08-22 ENCOUNTER — Other Ambulatory Visit: Payer: Self-pay

## 2018-08-22 ENCOUNTER — Ambulatory Visit (INDEPENDENT_AMBULATORY_CARE_PROVIDER_SITE_OTHER): Payer: Self-pay

## 2018-08-22 ENCOUNTER — Encounter (HOSPITAL_COMMUNITY): Payer: Self-pay | Admitting: Emergency Medicine

## 2018-08-22 DIAGNOSIS — K2100 Gastro-esophageal reflux disease with esophagitis, without bleeding: Secondary | ICD-10-CM

## 2018-08-22 DIAGNOSIS — R062 Wheezing: Secondary | ICD-10-CM

## 2018-08-22 DIAGNOSIS — R05 Cough: Secondary | ICD-10-CM

## 2018-08-22 MED ORDER — ALBUTEROL SULFATE HFA 108 (90 BASE) MCG/ACT IN AERS: 2.0000 | INHALATION_SPRAY | Freq: Four times a day (QID) | RESPIRATORY_TRACT | 0 refills | Status: DC | PRN

## 2018-08-22 MED ORDER — PANTOPRAZOLE SODIUM 40 MG PO TBEC: 40.0000 mg | DELAYED_RELEASE_TABLET | Freq: Two times a day (BID) | ORAL | 0 refills | Status: DC

## 2018-08-22 NOTE — ED Provider Notes (Signed)
[facility]    CSN: [id number] Arrival date & time: 08/22/18  1637     History   Chief Complaint Chief Complaint  Patient presents with  . Gastroesophageal Reflux    HPI Christian Ellison is a 58 y.o. male.   Pt is is here due to having a fever 101.7 and woke up choaking and is concerned about having aspiration pneumonia again. Has not been taking off the Nexium since it was removed from the shelves. His GI Dr was supposed to send a different medication,but is not at [facility] and they( fiance [name])  Has called then several times and has not heard from them. He also lost his PCP and does not have any more refills for his inhaler.     Past Medical History:  Diagnosis Date  . Anxiety   . Aspiration pneumonia (HCC) 05/26/2018   RLL  . Chronic pain   . GERD (gastroesophageal reflux disease)   . 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   Marland Kitchen Opiate abuse, continuous [facility])     Patient Active Problem List   Diagnosis Date Noted  . Recurrent aspiration pneumonia (HCC)   . Fever 06/09/2018  . Tachycardia 06/09/2018  . Hypoxia 06/09/2018  . Hypotension 06/09/2018  . Esophageal dysmotility   . Aspiration pneumonia of right lower lobe (HCC)   . Sepsis (HCC) 05/26/2018  . Pulmonary edema 02/02/2018  . HCAP (healthcare-associated pneumonia) 02/02/2018  . Weakness generalized 02/02/2018  . Hypertension 02/02/2018  . CHF (congestive heart failure) (HCC) 02/02/2018  . Nausea & vomiting 02/02/2018  . Dysphagia 02/02/2018  . Esophageal stricture 02/02/2018  . Esophageal stenosis 02/02/2018  . GERD (gastroesophageal reflux disease) 02/02/2018  . Polysubstance abuse (HCC) 02/02/2018  . Acute respiratory failure with hypoxia (HCC)   . Transaminitis   . Elevated troponin   . Overdose 01/02/2018  . Hx of hepatitis C 01/17/2013  . Depression 01/17/2013  . Anxiety 01/17/2013  . Right knee pain 01/17/2013    Past Surgical History:  Procedure Laterality Date  .  BIOPSY  03/18/2018   Procedure: BIOPSY;  Surgeon: Malissa Hippo, MD;  [facility];  Service: Endoscopy;;  gastric   . COLONOSCOPY N/A 11/22/2013   Dr. Karilyn Cota: Normal except for hemorrhoids.  Next colonoscopy 10 years.  . ESOPHAGEAL DILATION N/A 03/18/2018   Procedure: ESOPHAGEAL DILATION;  Surgeon: Malissa Hippo, MD;  [facility];  Service: Endoscopy;  Laterality: N/A;  . ESOPHAGEAL MANOMETRY N/A 06/15/2018   Procedure: ESOPHAGEAL MANOMETRY (EM);  Surgeon: Napoleon Form, MD;  [facility];  Service: Endoscopy;  Laterality: N/A;  . ESOPHAGOGASTRODUODENOSCOPY (EGD) WITH PROPOFOL N/A 03/18/2018   Dr. Karilyn Cota: Benign-appearing mild esophageal stenosis proximal to the GE J, status post dilation.  2 cm hiatal hernia.  Gastritis, biopsies negative for H. pylori  . Fracture Right Leg     Patient has rod/screws in this leg  . Rotor Cuff  2012   Right Shoulder     Home Medications    Prior to Admission medications   Medication Sig Start Date End Date Taking? Authorizing Provider  albuterol (PROVENTIL HFA;VENTOLIN HFA) 108 (90 Base) MCG/ACT inhaler Inhale 2 puffs into the lungs every 6 (six) hours as needed for wheezing or shortness of breath. 05/28/18  Yes Ghimire, Werner Lean, MD  ALPRAZolam Prudy Feeler) 0.5 MG tablet Take 0.5 mg by mouth at bedtime.    Yes [provider]  atenolol (TENORMIN) 25 MG tablet Take  25 mg by mouth daily.   Yes [provider]  levothyroxine (SYNTHROID, LEVOTHROID) 25 MCG tablet Take 25 mcg by mouth daily. 05/13/18  Yes [provider]  diltiazem (CARDIZEM) 30 MG tablet Take 1 tablet (30 mg total) by mouth 3 (three) times daily before meals. 07/06/18   Malissa Hippo, MD  Multiple Vitamin (MULTIVITAMIN WITH MINERALS) TABS tablet Take 1 tablet by mouth daily. 50+    [provider]  pantoprazole (PROTONIX) 40 MG tablet Take 1 tablet (40 mg total) by mouth 2 (two) times daily. Patient taking differently:  Take 40 mg by mouth 2 (two) times daily.  06/11/18 08/10/18  Johnson, Clanford L, MD  ranitidine (ZANTAC) 150 MG capsule Take 1 capsule (150 mg total) by mouth every evening. 06/11/18   Cleora Fleet, MD    Family History Family History  Problem Relation Age of Onset  . Breast cancer Mother   . Colon cancer Father   . Bladder Cancer Father   . Prostate cancer Father   . Hypertension Sister   . Hypothyroidism Sister   . Healthy Sister   . Healthy Daughter   . Healthy Son   . Healthy Son     Social History Social History   Tobacco Use  . Smoking status: Never Smoker  . Smokeless tobacco: Never Used  Substance Use Topics  . Alcohol use: No    Comment: Patient states that it has been over a year  . Drug use: Yes    Comment: opiates     Allergies   Patient has no known allergies.   Review of Systems Review of Systems  Constitutional: Positive for diaphoresis and fever.  HENT: Negative.   Eyes: Negative for discharge.  Respiratory: Positive for cough, choking and wheezing. Negative for chest tightness and shortness of breath.   Cardiovascular: Negative for chest pain.  Gastrointestinal: Negative for nausea.       GERD  Skin: Negative for rash.  Neurological: Negative for dizziness.  Hematological: Negative for adenopathy.   Physical Exam Triage Vital Signs ED Triage Vitals  Enc Vitals Group     BP 08/22/18 1734 (!) 160/88     Pulse Rate 08/22/18 1734 96     Resp 08/22/18 1734 18     Temp 08/22/18 1734 98.5 F (36.9 C)     Temp Source 08/22/18 1734 Oral     SpO2 08/22/18 1734 96 %     Weight --      Height --      Head Circumference --      Peak Flow --      Pain Score 08/22/18 1728 0     Pain Loc --      Pain Edu? --      Excl. in GC? --    No data found.  Updated Vital Signs BP (!) 160/88 (BP Location: Left Arm)   Pulse 96   Temp 98.5 F (36.9 C) (Oral)   Resp 18   SpO2 96%   Visual Acuity Right Eye Distance:   Left Eye Distance:     Bilateral Distance:    Right Eye Near:   Left Eye Near:    Bilateral Near:     Physical Exam   UC Treatments / Results  Labs (all labs ordered are listed, but only abnormal results are displayed) Labs Reviewed - No data to display  EKG None  Radiology CXR is negative Procedures Procedures   Medications Ordered in UC Medications -  No data to display  Initial Impression / Assessment and Plan / UC Course  I have reviewed the triage vital signs and the nursing notes. Pertinent  imaging results that were available during my care of the patient were reviewed by me and considered in my medical decision making (see chart for details). I reassured him he does not have pneumonia today, I educated him about prevention of GERD and needs to FU with his GI doctor as told in November when he needed to FU.   I refilled his inhaler.  Final Clinical Impressions(s) / UC Diagnoses   Final diagnoses:  None   Discharge Instructions   None    ED Prescriptions    None     Controlled Substance Prescriptions [place] Controlled Substance Registry consulted?    Garey HamRodriguez-Southworth, Deepa Barthel, [place] 08/22/18 1913

## 2018-08-22 NOTE — Discharge Instructions (Addendum)
Get back on your stomach medication.  Call your GI Dr and have a follow up in 2-3 weeks.  Try to sleep on 45 degree recline ti prevent aspiration.  You need to not eat for at least 3 hours or more before bed time.

## 2018-08-22 NOTE — ED Triage Notes (Addendum)
Complains of acid reflux.  Patient and significant other concerned of possible pneumonia.  Difficult to get history.  Patient and significant other jumping around in conversation

## 2018-09-06 DIAGNOSIS — Z139 Encounter for screening, unspecified: Secondary | ICD-10-CM

## 2018-09-06 LAB — GLUCOSE, POCT (MANUAL RESULT ENTRY): POC GLUCOSE: 142 mg/dL — AB (ref 70–99)

## 2018-09-07 ENCOUNTER — Telehealth: Payer: Self-pay
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
## 2018-09-07 NOTE — Congregational Nurse Program (Signed)
  Dept: [phone]   Congregational Nurse Program Note  Date of Encounter: 09/06/2018  Past Medical History: Past Medical History:  Diagnosis Date  . Anxiety   . Aspiration pneumonia (HCC) 05/26/2018   RLL  . Chronic pain   . GERD (gastroesophageal reflux disease)   . 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   Marland Kitchen Opiate abuse, continuous (HCC)     Encounter Details:  Pt presents to clinic to receive screening referral for PCP and to enroll in Care Connect Program; States he is needing a PCP so that he can get his medication refills for his HTN meds and inhaler.  States he has other meds, but does not need a refill at this time on them.       Vitals Signs checked; Random Blood Glucose conducted (ABN); O2 Sat (measured at 5 mins between each other were both (ABN).   ABN Vitals signs reviewed with Nurse Case Manager and O2 Sat was re-measured by RN Nurse Case Manager.  Pt was also referred to Nurse Case Manager for follow up contact post visit on today  Pt was enrolled in Care Connect program by finanical counselor, C.Broadnax,    Appt was made for [facility].

## 2018-09-07 NOTE — Telephone Encounter (Signed)
Attempted to call Carney HarderAmanda Royals client's Mr Giza's fiance' to follow up regarding [facility]. No answer, requested return call.   Francee NodalPatricia R Syeda Prickett Rn

## 2018-10-10 ENCOUNTER — Ambulatory Visit (INDEPENDENT_AMBULATORY_CARE_PROVIDER_SITE_OTHER): Payer: Medicaid Other | Admitting: Internal Medicine

## 2018-10-10 ENCOUNTER — Encounter (INDEPENDENT_AMBULATORY_CARE_PROVIDER_SITE_OTHER): Payer: Self-pay | Admitting: Internal Medicine

## 2018-10-10 VITALS — BP 163/72 | HR 62 | Temp 98.5°F | Ht 72.0 in | Wt 181.4 lb

## 2018-10-10 DIAGNOSIS — B1710 Acute hepatitis C without hepatic coma: Secondary | ICD-10-CM

## 2018-10-10 NOTE — Progress Notes (Signed)
Subjective:    Patient ID: Christian Ellison, male    DOB: August 13, 1961, 58 y.o.   MRN: [id number]  HPI Here today desiring treatment for Hepatitis C.  [facility]. He tells me he is doing okay. Appetite is good. No weight loss.  BMs are normal. No melena or BRRB.   Liver biopsy in 2014 revealed Grade 2 inflammation and stage 0 fibrosis.  Hx of aspiration pneumonia. Has been on life support in the past for pneumonia.   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 Criterion for achalasia not met. Can decrease atenolol to 12.5 mg daily. Will begin Cardizem 30 mg by mouth 30 minutes before each meal.    Review of Systems Past Medical History:  Diagnosis Date  . Anxiety   . Aspiration pneumonia ([place]) 05/26/2018   RLL  . Chronic pain   . GERD (gastroesophageal reflux disease)   . 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   Marland Kitchen Opiate abuse, continuous ([place])     Past Surgical History:  Procedure Laterality Date  . BIOPSY  03/18/2018   Procedure: BIOPSY;  Surgeon: Rogene Houston, MD;  [facility];  Service: Endoscopy;;  gastric   . COLONOSCOPY N/A 11/22/2013   Dr. Laural Golden: Normal except for hemorrhoids.  Next colonoscopy 10 years.  . ESOPHAGEAL DILATION N/A 03/18/2018   Procedure: ESOPHAGEAL DILATION;  Surgeon: Rogene Houston, MD;  [facility];  Service: Endoscopy;  Laterality: N/A;  . ESOPHAGEAL MANOMETRY N/A 06/15/2018   Procedure: ESOPHAGEAL MANOMETRY (EM);  Surgeon: Mauri Pole, MD;  [facility];  Service: Endoscopy;  Laterality: N/A;  . ESOPHAGOGASTRODUODENOSCOPY (EGD) WITH PROPOFOL N/A 03/18/2018   Dr. Laural Golden: Benign-appearing mild esophageal stenosis proximal to the GE J, status post dilation.  2 cm hiatal hernia.  Gastritis, biopsies negative for H. pylori  . Fracture Right Leg     Patient has rod/screws in this leg  . Rotor Cuff  2012   Right Shoulder      No Known Allergies  Current Outpatient Medications on File Prior to Visit  Medication Sig Dispense Refill  . albuterol (PROVENTIL HFA;VENTOLIN HFA) 108 (90 Base) MCG/ACT inhaler Inhale 2 puffs into the lungs every 6 (six) hours as needed for wheezing or shortness of breath. 1 Inhaler 0  . atenolol (TENORMIN) 25 MG tablet Take 25 mg by mouth daily.    Marland Kitchen levothyroxine (SYNTHROID, LEVOTHROID) 25 MCG tablet Take 25 mcg by mouth daily.  11  . Multiple Vitamin (MULTIVITAMIN WITH MINERALS) TABS tablet Take 1 tablet by mouth daily. 50+    . pantoprazole (PROTONIX) 40 MG tablet Take 40 mg by mouth daily.    . pantoprazole (PROTONIX) 40 MG tablet Take 1 tablet (40 mg total) by mouth 2 (two) times daily. 30 tablet 0   No current facility-administered medications on file prior to visit.         Objective:   Physical Exam Blood pressure (!) 163/72, pulse 62, temperature 98.5 F (36.9 C), height 6' (1.829 m), weight 181 lb 6.4 oz (82.3 kg). Alert and oriented. Skin warm and dry. Oral mucosa is moist.   . Sclera anicteric, conjunctivae is pink. Thyroid not enlarged. No cervical lymphadenopathy. Lungs clear. Heart regular rate and rhythm.  Abdomen is soft. Bowel sounds are positive. No hepatomegaly. No abdominal masses felt. No tenderness.  No edema to lower extremities. Patient is alert and oriented.  Assessment & Plan:  Hepatitis C. Will get labs and US abdomen with elast. Will treat once I have back.

## 2018-10-10 NOTE — Patient Instructions (Signed)
Labs and US. 

## 2018-10-14 ENCOUNTER — Ambulatory Visit (HOSPITAL_COMMUNITY): Payer: Self-pay | Attending: Internal Medicine

## 2018-10-21 LAB — CBC WITH DIFFERENTIAL/PLATELET
ABSOLUTE MONOCYTES: 340 {cells}/uL (ref 200–950)
BASOS PCT: 0.4 %
Basophils Absolute: 22 cells/uL (ref 0–200)
Eosinophils Absolute: 151 cells/uL (ref 15–500)
Eosinophils Relative: 2.8 %
HCT: 40 % (ref 38.5–50.0)
HEMOGLOBIN: 13.5 g/dL (ref 13.2–17.1)
Lymphs Abs: 1064 cells/uL (ref 850–3900)
MCH: 29.5 pg (ref 27.0–33.0)
MCHC: 33.8 g/dL (ref 32.0–36.0)
MCV: 87.5 fL (ref 80.0–100.0)
MPV: 9.5 fL (ref 7.5–12.5)
Monocytes Relative: 6.3 %
Neutro Abs: 3823 cells/uL (ref 1500–7800)
Neutrophils Relative %: 70.8 %
PLATELETS: 228 10*3/uL (ref 140–400)
RBC: 4.57 10*6/uL (ref 4.20–5.80)
RDW: 13.2 % (ref 11.0–15.0)
TOTAL LYMPHOCYTE: 19.7 %
WBC: 5.4 10*3/uL (ref 3.8–10.8)

## 2018-10-21 LAB — HEPATITIS PANEL, ACUTE
Hep A IgM: NONREACTIVE
Hep B C IgM: NONREACTIVE
Hepatitis B Surface Ag: NONREACTIVE
Hepatitis C Ab: REACTIVE — AB
SIGNAL TO CUT-OFF: 27.1 — ABNORMAL HIGH (ref <1.00)

## 2018-10-21 LAB — HEPATIC FUNCTION PANEL
AG Ratio: 1.4 (calc) (ref 1.0–2.5)
ALT: 29 U/L (ref 9–46)
AST: 22 U/L (ref 10–35)
Albumin: 4.4 g/dL (ref 3.6–5.1)
Alkaline phosphatase (APISO): 77 U/L (ref 35–144)
Bilirubin, Direct: 0.1 mg/dL (ref 0.0–0.2)
Globulin: 3.1 g/dL (calc) (ref 1.9–3.7)
Indirect Bilirubin: 0.4 mg/dL (calc) (ref 0.2–1.2)
Total Bilirubin: 0.5 mg/dL (ref 0.2–1.2)
Total Protein: 7.5 g/dL (ref 6.1–8.1)

## 2018-10-21 LAB — DRUGS OF ABUSE SCREEN W/O ALC, ROUTINE URINE
AMPHETAMINES (1000 ng/mL SCRN): NEGATIVE
BARBITURATES: NEGATIVE
BENZODIAZEPINES: POSITIVE — AB
COCAINE METABOLITES: NEGATIVE
MARIJUANA MET (50 ng/mL SCRN): NEGATIVE
METHADONE: NEGATIVE
METHAQUALONE: NEGATIVE
OPIATES: NEGATIVE
PHENCYCLIDINE: NEGATIVE
PROPOXYPHENE: NEGATIVE

## 2018-10-21 LAB — HEPATITIS C RNA QUANTITATIVE
HCV Quantitative Log: 4.1 Log IU/mL — ABNORMAL HIGH
HCV RNA, PCR, QN: 12600 IU/mL — ABNORMAL HIGH

## 2018-10-21 LAB — PROTIME-INR
INR: 0.9
Prothrombin Time: 9.7 s (ref 9.0–11.5)

## 2018-10-21 LAB — HIV ANTIBODY (ROUTINE TESTING W REFLEX): HIV 1&2 Ab, 4th Generation: NONREACTIVE

## 2018-10-21 LAB — AFP TUMOR MARKER: AFP-Tumor Marker: 0.9 ng/mL (ref <6.1)

## 2018-10-21 LAB — HEPATITIS C GENOTYPE

## 2018-10-24 ENCOUNTER — Ambulatory Visit (HOSPITAL_COMMUNITY)
Admission: RE | Admit: 2018-10-24 | Discharge: 2018-10-24 | Disposition: A | Discharge status: Home / Self Care | Payer: Self-pay | Source: Ambulatory Visit | Attending: Internal Medicine | Admitting: Internal Medicine

## 2018-10-24 ENCOUNTER — Telehealth (INDEPENDENT_AMBULATORY_CARE_PROVIDER_SITE_OTHER): Payer: Self-pay | Admitting: Internal Medicine

## 2018-10-24 DIAGNOSIS — B1710 Acute hepatitis C without hepatic coma: Secondary | ICD-10-CM | POA: Insufficient documentation

## 2018-10-24 NOTE — Telephone Encounter (Signed)
Christian Ellison, Harvoni x 8 weeks. 

## 2018-10-27 NOTE — Telephone Encounter (Signed)
A Pa has ben sent to [facility]. Once we hear from them , we will contact the patient.

## 2018-11-06 ENCOUNTER — Emergency Department (HOSPITAL_COMMUNITY): Payer: Self-pay

## 2018-11-06 ENCOUNTER — Other Ambulatory Visit: Payer: Self-pay

## 2018-11-06 ENCOUNTER — Encounter (HOSPITAL_COMMUNITY): Payer: Self-pay | Admitting: Emergency Medicine

## 2018-11-06 ENCOUNTER — Inpatient Hospital Stay (HOSPITAL_COMMUNITY)
Admission: EM | Admit: 2018-11-06 | Discharge: 2018-11-09 | DRG: 871 | Disposition: A | Discharge status: Home / Self Care | Payer: Self-pay | Attending: Internal Medicine | Admitting: Internal Medicine

## 2018-11-06 DIAGNOSIS — G8929 Other chronic pain: Secondary | ICD-10-CM | POA: Diagnosis present

## 2018-11-06 DIAGNOSIS — F32A Depression, unspecified: Secondary | ICD-10-CM | POA: Diagnosis present

## 2018-11-06 DIAGNOSIS — Z800 Family history of malignant neoplasm of digestive organs: Secondary | ICD-10-CM

## 2018-11-06 DIAGNOSIS — K2289 Other specified disease of esophagus: Secondary | ICD-10-CM

## 2018-11-06 DIAGNOSIS — I10 Essential (primary) hypertension: Secondary | ICD-10-CM | POA: Diagnosis present

## 2018-11-06 DIAGNOSIS — K219 Gastro-esophageal reflux disease without esophagitis: Secondary | ICD-10-CM | POA: Diagnosis present

## 2018-11-06 DIAGNOSIS — Z8042 Family history of malignant neoplasm of prostate: Secondary | ICD-10-CM

## 2018-11-06 DIAGNOSIS — A419 Sepsis, unspecified organism: Principal | ICD-10-CM | POA: Diagnosis present

## 2018-11-06 DIAGNOSIS — B182 Chronic viral hepatitis C: Secondary | ICD-10-CM | POA: Diagnosis present

## 2018-11-06 DIAGNOSIS — K222 Esophageal obstruction: Secondary | ICD-10-CM | POA: Diagnosis present

## 2018-11-06 DIAGNOSIS — R410 Disorientation, unspecified: Secondary | ICD-10-CM | POA: Diagnosis present

## 2018-11-06 DIAGNOSIS — Z79890 Hormone replacement therapy (postmenopausal): Secondary | ICD-10-CM

## 2018-11-06 DIAGNOSIS — D72810 Lymphocytopenia: Secondary | ICD-10-CM | POA: Diagnosis present

## 2018-11-06 DIAGNOSIS — F419 Anxiety disorder, unspecified: Secondary | ICD-10-CM | POA: Diagnosis present

## 2018-11-06 DIAGNOSIS — F418 Other specified anxiety disorders: Secondary | ICD-10-CM | POA: Diagnosis present

## 2018-11-06 DIAGNOSIS — F1320 Sedative, hypnotic or anxiolytic dependence, uncomplicated: Secondary | ICD-10-CM | POA: Diagnosis present

## 2018-11-06 DIAGNOSIS — Z803 Family history of malignant neoplasm of breast: Secondary | ICD-10-CM

## 2018-11-06 DIAGNOSIS — R0902 Hypoxemia: Secondary | ICD-10-CM

## 2018-11-06 DIAGNOSIS — F329 Major depressive disorder, single episode, unspecified: Secondary | ICD-10-CM | POA: Diagnosis present

## 2018-11-06 DIAGNOSIS — J9601 Acute respiratory failure with hypoxia: Secondary | ICD-10-CM | POA: Diagnosis present

## 2018-11-06 DIAGNOSIS — Z789 Other specified health status: Secondary | ICD-10-CM

## 2018-11-06 DIAGNOSIS — Z79899 Other long term (current) drug therapy: Secondary | ICD-10-CM

## 2018-11-06 DIAGNOSIS — T424X1A Poisoning by benzodiazepines, accidental (unintentional), initial encounter: Secondary | ICD-10-CM | POA: Diagnosis present

## 2018-11-06 DIAGNOSIS — J180 Bronchopneumonia, unspecified organism: Secondary | ICD-10-CM

## 2018-11-06 DIAGNOSIS — Z8249 Family history of ischemic heart disease and other diseases of the circulatory system: Secondary | ICD-10-CM

## 2018-11-06 DIAGNOSIS — Z8619 Personal history of other infectious and parasitic diseases: Secondary | ICD-10-CM | POA: Diagnosis present

## 2018-11-06 DIAGNOSIS — J690 Pneumonitis due to inhalation of food and vomit: Secondary | ICD-10-CM | POA: Diagnosis present

## 2018-11-06 DIAGNOSIS — Z8052 Family history of malignant neoplasm of bladder: Secondary | ICD-10-CM

## 2018-11-06 DIAGNOSIS — E039 Hypothyroidism, unspecified: Secondary | ICD-10-CM | POA: Diagnosis present

## 2018-11-06 DIAGNOSIS — J189 Pneumonia, unspecified organism: Secondary | ICD-10-CM

## 2018-11-06 DIAGNOSIS — J9691 Respiratory failure, unspecified with hypoxia: Secondary | ICD-10-CM | POA: Diagnosis present

## 2018-11-06 DIAGNOSIS — F1910 Other psychoactive substance abuse, uncomplicated: Secondary | ICD-10-CM | POA: Diagnosis present

## 2018-11-06 DIAGNOSIS — K224 Dyskinesia of esophagus: Secondary | ICD-10-CM | POA: Diagnosis present

## 2018-11-06 DIAGNOSIS — Z8701 Personal history of pneumonia (recurrent): Secondary | ICD-10-CM

## 2018-11-06 LAB — CBC WITH DIFFERENTIAL/PLATELET
Abs Immature Granulocytes: 0.04 10*3/uL (ref 0.00–0.07)
Basophils Absolute: 0 10*3/uL (ref 0.0–0.1)
Basophils Relative: 0 %
Eosinophils Absolute: 0 10*3/uL (ref 0.0–0.5)
Eosinophils Relative: 1 %
HCT: 50.2 % (ref 39.0–52.0)
Hemoglobin: 16.2 g/dL (ref 13.0–17.0)
Immature Granulocytes: 1 %
Lymphocytes Relative: 11 %
Lymphs Abs: 0.6 10*3/uL — ABNORMAL LOW (ref 0.7–4.0)
MCH: 30 pg (ref 26.0–34.0)
MCHC: 32.3 g/dL (ref 30.0–36.0)
MCV: 93 fL (ref 80.0–100.0)
Monocytes Absolute: 0.3 10*3/uL (ref 0.1–1.0)
Monocytes Relative: 5 %
Neutro Abs: 4.5 10*3/uL (ref 1.7–7.7)
Neutrophils Relative %: 82 %
Platelets: 178 10*3/uL (ref 150–400)
RBC: 5.4 MIL/uL (ref 4.22–5.81)
RDW: 13 % (ref 11.5–15.5)
WBC: 5.5 10*3/uL (ref 4.0–10.5)
nRBC: 0 % (ref 0.0–0.2)

## 2018-11-06 LAB — COMPREHENSIVE METABOLIC PANEL
ALT: 52 U/L — ABNORMAL HIGH (ref 0–44)
ANION GAP: 8 (ref 5–15)
AST: 39 U/L (ref 15–41)
Albumin: 3.9 g/dL (ref 3.5–5.0)
Alkaline Phosphatase: 67 U/L (ref 38–126)
BUN: 14 mg/dL (ref 6–20)
CO2: 25 mmol/L (ref 22–32)
Calcium: 8.3 mg/dL — ABNORMAL LOW (ref 8.9–10.3)
Chloride: 104 mmol/L (ref 98–111)
Creatinine, Ser: 0.78 mg/dL (ref 0.61–1.24)
GFR calc Af Amer: >60 mL/min (ref >60)
GFR calc non Af Amer: >60 mL/min (ref >60)
Glucose, Bld: 119 mg/dL — ABNORMAL HIGH (ref 70–99)
Potassium: 4.2 mmol/L (ref 3.5–5.1)
Sodium: 137 mmol/L (ref 135–145)
Total Bilirubin: 0.5 mg/dL (ref 0.3–1.2)
Total Protein: 7.1 g/dL (ref 6.5–8.1)

## 2018-11-06 LAB — PROTIME-INR
INR: 1 (ref 0.8–1.2)
PROTHROMBIN TIME: 13 s (ref 11.4–15.2)

## 2018-11-06 LAB — URINALYSIS, ROUTINE W REFLEX MICROSCOPIC
Bilirubin Urine: NEGATIVE
Glucose, UA: NEGATIVE mg/dL
Hgb urine dipstick: NEGATIVE
Ketones, ur: NEGATIVE mg/dL
Leukocytes,Ua: NEGATIVE
Nitrite: NEGATIVE
Protein, ur: NEGATIVE mg/dL
Specific Gravity, Urine: 1.011 (ref 1.005–1.030)
pH: 6 (ref 5.0–8.0)

## 2018-11-06 LAB — PROCALCITONIN: Procalcitonin: 12.07 ng/mL

## 2018-11-06 LAB — MRSA PCR SCREENING: MRSA by PCR: NEGATIVE

## 2018-11-06 LAB — RAPID URINE DRUG SCREEN, HOSP PERFORMED
AMPHETAMINES: NOT DETECTED
Barbiturates: NOT DETECTED
Benzodiazepines: POSITIVE — AB
Cocaine: NOT DETECTED
Opiates: NOT DETECTED
TETRAHYDROCANNABINOL: NOT DETECTED

## 2018-11-06 LAB — INFLUENZA PANEL BY PCR (TYPE A & B)
Influenza A By PCR: NEGATIVE
Influenza B By PCR: NEGATIVE

## 2018-11-06 LAB — LACTIC ACID, PLASMA
Lactic Acid, Venous: 1.8 mmol/L (ref 0.5–1.9)
Lactic Acid, Venous: 1.4 mmol/L (ref 0.5–1.9)

## 2018-11-06 LAB — TROPONIN I

## 2018-11-06 MED ORDER — SODIUM CHLORIDE 0.9 % IV SOLN
1000.0000 mL | INTRAVENOUS | Status: DC
  Administered 2018-11-06 – 2018-11-07 (×2): 1000 mL via INTRAVENOUS

## 2018-11-06 MED ORDER — SODIUM CHLORIDE 0.9 % IV SOLN
INTRAVENOUS | Status: AC
  Filled 2018-11-06: qty 1.5

## 2018-11-06 MED ORDER — SODIUM CHLORIDE 0.9% FLUSH: 3.0000 mL | INTRAVENOUS | Status: DC | PRN

## 2018-11-06 MED ORDER — HEPARIN SODIUM (PORCINE) 5000 UNIT/ML IJ SOLN
5000.0000 [IU] | Freq: Three times a day (TID) | INTRAMUSCULAR | Status: DC
  Administered 2018-11-06 – 2018-11-09 (×8): 5000 [IU] via SUBCUTANEOUS
  Filled 2018-11-06 (×9): qty 1

## 2018-11-06 MED ORDER — ACETAMINOPHEN 650 MG RE SUPP
650.0000 mg | Freq: Once | RECTAL | Status: AC
  Administered 2018-11-06: 650 mg via RECTAL
  Filled 2018-11-06: qty 1

## 2018-11-06 MED ORDER — VANCOMYCIN HCL IN DEXTROSE 1-5 GM/200ML-% IV SOLN
1000.0000 mg | Freq: Once | INTRAVENOUS | Status: AC
  Administered 2018-11-06: 1000 mg via INTRAVENOUS
  Filled 2018-11-06: qty 200

## 2018-11-06 MED ORDER — ORAL CARE MOUTH RINSE
15.0000 mL | Freq: Two times a day (BID) | OROMUCOSAL | Status: DC
  Administered 2018-11-06 – 2018-11-08 (×3): 15 mL via OROMUCOSAL

## 2018-11-06 MED ORDER — ONDANSETRON HCL 4 MG PO TABS: 4.0000 mg | ORAL_TABLET | Freq: Four times a day (QID) | ORAL | Status: DC | PRN

## 2018-11-06 MED ORDER — SODIUM CHLORIDE 0.9% FLUSH
3.0000 mL | Freq: Two times a day (BID) | INTRAVENOUS | Status: DC
  Administered 2018-11-06 – 2018-11-08 (×4): 3 mL via INTRAVENOUS

## 2018-11-06 MED ORDER — ATENOLOL 25 MG PO TABS: 25.0000 mg | ORAL_TABLET | Freq: Every day | ORAL | Status: DC

## 2018-11-06 MED ORDER — METRONIDAZOLE IN NACL 5-0.79 MG/ML-% IV SOLN
500.0000 mg | Freq: Once | INTRAVENOUS | Status: AC
  Administered 2018-11-06: 500 mg via INTRAVENOUS
  Filled 2018-11-06: qty 100

## 2018-11-06 MED ORDER — TRAZODONE HCL 50 MG PO TABS: 50.0000 mg | ORAL_TABLET | Freq: Every evening | ORAL | Status: DC | PRN

## 2018-11-06 MED ORDER — ADULT MULTIVITAMIN W/MINERALS CH
1.0000 | ORAL_TABLET | Freq: Every day | ORAL | Status: DC
  Administered 2018-11-07: 1 via ORAL
  Filled 2018-11-06 (×2): qty 1

## 2018-11-06 MED ORDER — GUAIFENESIN ER 600 MG PO TB12
600.0000 mg | ORAL_TABLET | Freq: Two times a day (BID) | ORAL | Status: DC
  Administered 2018-11-07 – 2018-11-09 (×4): 600 mg via ORAL
  Filled 2018-11-06 (×5): qty 1

## 2018-11-06 MED ORDER — VANCOMYCIN HCL 10 G IV SOLR: 1500.0000 mg | Freq: Once | INTRAVENOUS | Status: DC

## 2018-11-06 MED ORDER — SODIUM CHLORIDE 0.9 % IV BOLUS (SEPSIS)
1000.0000 mL | Freq: Once | INTRAVENOUS | Status: AC
  Administered 2018-11-06: 2000 mL via INTRAVENOUS

## 2018-11-06 MED ORDER — POLYETHYLENE GLYCOL 3350 17 G PO PACK: 17.0000 g | PACK | Freq: Every day | ORAL | Status: DC | PRN

## 2018-11-06 MED ORDER — SODIUM CHLORIDE 0.9 % IV SOLN
2.0000 g | Freq: Once | INTRAVENOUS | Status: AC
  Administered 2018-11-06: 2 g via INTRAVENOUS
  Filled 2018-11-06: qty 2

## 2018-11-06 MED ORDER — CHLORHEXIDINE GLUCONATE CLOTH 2 % EX PADS
6.0000 | MEDICATED_PAD | Freq: Every day | CUTANEOUS | Status: DC
  Administered 2018-11-07: 6 via TOPICAL

## 2018-11-06 MED ORDER — SODIUM CHLORIDE 0.9 % IV SOLN: 1000.0000 mL | INTRAVENOUS | Status: DC

## 2018-11-06 MED ORDER — ACETAMINOPHEN 325 MG PO TABS
650.0000 mg | ORAL_TABLET | Freq: Four times a day (QID) | ORAL | Status: DC | PRN
  Administered 2018-11-08: 650 mg via ORAL
  Filled 2018-11-06: qty 2

## 2018-11-06 MED ORDER — ATENOLOL 25 MG PO TABS
25.0000 mg | ORAL_TABLET | Freq: Every day | ORAL | Status: DC
  Administered 2018-11-08 – 2018-11-09 (×2): 25 mg via ORAL
  Filled 2018-11-06 (×4): qty 1

## 2018-11-06 MED ORDER — SODIUM CHLORIDE 0.9 % IV SOLN
1.5000 g | Freq: Four times a day (QID) | INTRAVENOUS | Status: DC
  Administered 2018-11-06 – 2018-11-09 (×12): 1.5 g via INTRAVENOUS
  Filled 2018-11-06 (×13): qty 1.5

## 2018-11-06 MED ORDER — SODIUM CHLORIDE 0.9 % IV SOLN
1000.0000 mL | INTRAVENOUS | Status: DC
  Administered 2018-11-06: 1000 mL via INTRAVENOUS

## 2018-11-06 MED ORDER — LEVOTHYROXINE SODIUM 25 MCG PO TABS
25.0000 ug | ORAL_TABLET | Freq: Every day | ORAL | Status: DC
  Administered 2018-11-08 – 2018-11-09 (×2): 25 ug via ORAL
  Filled 2018-11-06 (×3): qty 1

## 2018-11-06 MED ORDER — SODIUM CHLORIDE 0.9 % IV SOLN
Freq: Once | INTRAVENOUS | Status: AC
  Administered 2018-11-06: 13:00:00 via INTRAVENOUS

## 2018-11-06 MED ORDER — SODIUM CHLORIDE 0.9 % IV SOLN: 1.0000 g | Freq: Three times a day (TID) | INTRAVENOUS | Status: DC

## 2018-11-06 MED ORDER — ONDANSETRON HCL 4 MG/2ML IJ SOLN: 4.0000 mg | Freq: Four times a day (QID) | INTRAMUSCULAR | Status: DC | PRN

## 2018-11-06 MED ORDER — VANCOMYCIN HCL IN DEXTROSE 1-5 GM/200ML-% IV SOLN: 1000.0000 mg | Freq: Three times a day (TID) | INTRAVENOUS | Status: DC

## 2018-11-06 MED ORDER — SODIUM CHLORIDE 0.9 % IV SOLN: 250.0000 mL | INTRAVENOUS | Status: DC | PRN

## 2018-11-06 MED ORDER — ACETAMINOPHEN 650 MG RE SUPP: 650.0000 mg | Freq: Four times a day (QID) | RECTAL | Status: DC | PRN

## 2018-11-06 MED ORDER — ALBUTEROL SULFATE HFA 108 (90 BASE) MCG/ACT IN AERS
2.0000 | INHALATION_SPRAY | Freq: Four times a day (QID) | RESPIRATORY_TRACT | Status: DC | PRN
  Filled 2018-11-06: qty 6.7

## 2018-11-06 MED ORDER — PANTOPRAZOLE SODIUM 40 MG PO TBEC
40.0000 mg | DELAYED_RELEASE_TABLET | Freq: Two times a day (BID) | ORAL | Status: DC
  Administered 2018-11-07 – 2018-11-09 (×5): 40 mg via ORAL
  Filled 2018-11-06 (×6): qty 1

## 2018-11-06 MED ORDER — SODIUM CHLORIDE 0.9 % IV BOLUS (SEPSIS): 1000.0000 mL | Freq: Once | INTRAVENOUS | Status: DC

## 2018-11-06 NOTE — ED Provider Notes (Signed)
[facility] EMERGENCY DEPARTMENT Provider Note   CSN: [id number] Arrival date & time: 11/06/18  1023    History   Chief Complaint Chief Complaint  Patient presents with  . Shortness of Breath    HPI Christian Ellison is a 58 y.o. male.     The history is provided by the patient, the EMS personnel and medical records. No language interpreter was used.  Shortness of Breath     58 year old male with history of recurrent aspiration pneumonia, esophageal dysmotility, chronic pain anxiety, hepatitis brought here via EMS for concerns of aspiration pneumonia.  Family report that patient was having difficulties with his dinner last night and this morning he was in bed appears altered.  When EMS arrived, his O2 sats was in the 70s on room air.  It did improve to the 90s with supplemental oxygen.  Patient does have diminished breath sounds on the right side called EMS.  At this time, patient does not have any significant complaint but he appears to be altered therefore level 5 caveats applied.  At this time, patient denies having fever chills or productive cough.  He does have recurrent aspiration pneumonia.  He does have history of opiate abuse as well as esophageal dysmotility.  Patient was able to tell me that he has not left his house recently due to concern of COVID-19 and denies any recent sick contact.   Past Medical History:  Diagnosis Date  . Anxiety   . Aspiration pneumonia (HCC) 05/26/2018   RLL  . Chronic pain   . GERD (gastroesophageal reflux disease)   . 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   Marland Kitchen Opiate abuse, continuous [facility])     Patient Active Problem List   Diagnosis Date Noted  . Recurrent aspiration pneumonia (HCC)   . Fever 06/09/2018  . Tachycardia 06/09/2018  . Hypoxia 06/09/2018  . Hypotension 06/09/2018  . Esophageal dysmotility   . Aspiration pneumonia of right lower lobe (HCC)   . Sepsis (HCC) 05/26/2018  . Pulmonary edema 02/02/2018  . HCAP  (healthcare-associated pneumonia) 02/02/2018  . Weakness generalized 02/02/2018  . Hypertension 02/02/2018  . CHF (congestive heart failure) (HCC) 02/02/2018  . Nausea & vomiting 02/02/2018  . Dysphagia 02/02/2018  . Esophageal stricture 02/02/2018  . Esophageal stenosis 02/02/2018  . GERD (gastroesophageal reflux disease) 02/02/2018  . Polysubstance abuse (HCC) 02/02/2018  . Acute respiratory failure with hypoxia (HCC)   . Transaminitis   . Elevated troponin   . Overdose 01/02/2018  . Hx of hepatitis C 01/17/2013  . Depression 01/17/2013  . Anxiety 01/17/2013  . Right knee pain 01/17/2013    Past Surgical History:  Procedure Laterality Date  . BIOPSY  03/18/2018   Procedure: BIOPSY;  Surgeon: Malissa Hippo, MD;  Location: [facility] ENDO SUITE;  Service: Endoscopy;;  gastric   . COLONOSCOPY N/A 11/22/2013   Dr. Karilyn Cota: Normal except for hemorrhoids.  Next colonoscopy 10 years.  . ESOPHAGEAL DILATION N/A 03/18/2018   Procedure: ESOPHAGEAL DILATION;  Surgeon: Malissa Hippo, MD;  Location: [facility] ENDO SUITE;  Service: Endoscopy;  Laterality: N/A;  . ESOPHAGEAL MANOMETRY N/A 06/15/2018   Procedure: ESOPHAGEAL MANOMETRY (EM);  Surgeon: Napoleon Form, MD;  Location: [facility] ENDOSCOPY;  Service: Endoscopy;  Laterality: N/A;  . ESOPHAGOGASTRODUODENOSCOPY (EGD) WITH PROPOFOL N/A 03/18/2018   Dr. Karilyn Cota: Benign-appearing mild esophageal stenosis proximal to the GE J, status post dilation.  2 cm hiatal hernia.  Gastritis, biopsies negative for H. pylori  .  Fracture Right Leg     Patient has rod/screws in this leg  . Rotor Cuff  2012   Right Shoulder        Home Medications    Prior to Admission medications   Medication Sig Start Date End Date Taking? Authorizing Provider  albuterol (PROVENTIL HFA;VENTOLIN HFA) 108 (90 Base) MCG/ACT inhaler Inhale 2 puffs into the lungs every 6 (six) hours as needed for wheezing or shortness of breath. 08/22/18   Rodriguez-Southworth, Nettie Elm, PA-C  atenolol  (TENORMIN) 25 MG tablet Take 25 mg by mouth daily.    [provider]  levothyroxine (SYNTHROID, LEVOTHROID) 25 MCG tablet Take 25 mcg by mouth daily. 05/13/18   [provider]  Multiple Vitamin (MULTIVITAMIN WITH MINERALS) TABS tablet Take 1 tablet by mouth daily. 50+    [provider]  pantoprazole (PROTONIX) 40 MG tablet Take 1 tablet (40 mg total) by mouth 2 (two) times daily. 08/22/18 09/21/18  Rodriguez-Southworth, Nettie Elm, PA-C  pantoprazole (PROTONIX) 40 MG tablet Take 40 mg by mouth daily.    [provider]    Family History Family History  Problem Relation Age of Onset  . Breast cancer Mother   . Colon cancer Father   . Bladder Cancer Father   . Prostate cancer Father   . Hypertension Sister   . Hypothyroidism Sister   . Healthy Sister   . Healthy Daughter   . Healthy Son   . Healthy Son     Social History Social History   Tobacco Use  . Smoking status: Never Smoker  . Smokeless tobacco: Never Used  Substance Use Topics  . Alcohol use: No    Comment: Patient states that it has been over a year  . Drug use: Yes    Types: Benzodiazepines    Comment: opiates     Allergies   Patient has no known allergies.   Review of Systems Review of Systems  Unable to perform ROS: Mental status change  Respiratory: Positive for shortness of breath.      Physical Exam Updated Vital Signs BP 131/75 (BP Location: Right Arm)   Pulse (!) 119   Temp 100.3 F (37.9 C) (Oral)   Resp 15   Ht 6' (1.829 m)   Wt 86.2 kg   SpO2 90%   BMI 25.77 kg/m   Physical Exam Vitals signs and nursing note reviewed.  Constitutional:      Appearance: He is well-developed. He is ill-appearing.     Comments: Patient is delirious.  HENT:     Head: Atraumatic.     Comments: Mouth is dry Eyes:     Conjunctiva/sclera: Conjunctivae normal.     Comments: Pupils 2+ bilaterally and reactive  Neck:     Musculoskeletal: Neck supple.  Cardiovascular:      Rate and Rhythm: Tachycardia present.  Pulmonary:     Breath sounds: Examination of the right-middle field reveals rales. Examination of the right-lower field reveals rales. Decreased breath sounds and rales present.  Chest:     Chest wall: No tenderness.  Abdominal:     Palpations: Abdomen is soft.     Tenderness: There is no abdominal tenderness.  Musculoskeletal:     Right lower leg: No edema.     Left lower leg: No edema.  Skin:    Findings: No rash.  Neurological:     Mental Status: He is disoriented.     GCS: GCS eye subscore is 4. GCS verbal subscore is 4. GCS  motor subscore is 6.      ED Treatments / Results  Labs (all labs ordered are listed, but only abnormal results are displayed) Labs Reviewed  CBC WITH DIFFERENTIAL/PLATELET - Abnormal; Notable for the following components:      Result Value   Lymphs Abs 0.6 (*)    All other components within normal limits  RAPID URINE DRUG SCREEN, HOSP PERFORMED - Abnormal; Notable for the following components:   Benzodiazepines POSITIVE (*)    All other components within normal limits  COMPREHENSIVE METABOLIC PANEL - Abnormal; Notable for the following components:   Glucose, Bld 119 (*)    Calcium 8.3 (*)    ALT 52 (*)    All other components within normal limits  CULTURE, BLOOD (ROUTINE X 2)  CULTURE, BLOOD (ROUTINE X 2)  URINE CULTURE  RESPIRATORY PANEL BY PCR  NOVEL CORONAVIRUS, NAA (HOSPITAL ORDER, SEND-OUT TO REF LAB)  LACTIC ACID, PLASMA  URINALYSIS, ROUTINE W REFLEX MICROSCOPIC  INFLUENZA PANEL BY PCR (TYPE A & B)  PROTIME-INR  TROPONIN I  LACTIC ACID, PLASMA    EKG EKG Interpretation  Date/Time:  Sunday November 06 2018 10:30:36 EDT Ventricular Rate:  118 PR Interval:    QRS Duration: 82 QT Interval:  310 QTC Calculation: 435 R Axis:   -19 Text Interpretation:  Sinus tachycardia Probable left atrial enlargement Borderline left axis deviation When compared with ECG of 06/09/2018 No significant change was  found Confirmed by Samuel Jester [phone]) on 11/06/2018 10:44:44 AM   Radiology Dg Chest Port 1 View  Result Date: 11/06/2018 CLINICAL DATA:  58 year old male with history of fever. History of esophageal spasms in strictures. Prior history of aspiration pneumonia. EXAM: PORTABLE CHEST 1 VIEW COMPARISON:  Chest x-ray 08/22/2018. FINDINGS: New ill-defined airspace consolidation in the right lung base without obscuration of either the right heart border or right hemidiaphragm (potentially within either the right middle or right lower lobe) which has hazy imaging characteristics. Additional less pronounced haziness is also noted in the region of the right upper lobe, and to a lesser extent in the left base. Relative sparing of the left upper lobe. No pleural effusions. No evidence of pulmonary edema. Heart size and mediastinal contours are within normal limits. IMPRESSION: 1. Findings are concerning for multilobar bronchopneumonia, most severe in either the right middle or lower lobe, but with additional involvement of the right upper lobe, and some patchy ill-defined opacities at the left lung base is well. This pattern is not classic for aspiration pneumonia, and could be reflective of a variety of infectious agents, including viral pneumonia. These results were called by telephone at the time of interpretation on 11/06/2018 at 11:04 am to Dr. Samuel Jester, who verbally acknowledged these results. Electronically Signed   By: Trudie Reed M.D.   On: 11/06/2018 11:04    Procedures .Critical Care Performed by: Fayrene Helper, PA-C Authorized by: Fayrene Helper, PA-C   Critical care provider statement:    Critical care time (minutes):  45   Critical care was time spent personally by me on the following activities:  Discussions with consultants, evaluation of patient's response to treatment, examination of patient, ordering and performing treatments and interventions, ordering and review of laboratory  studies, ordering and review of radiographic studies, pulse oximetry, re-evaluation of patient's condition, obtaining history from patient or surrogate and review of old charts   (including critical care time)  Medications Ordered in ED Medications  0.9 %  sodium chloride infusion (1,000 mLs Intravenous Not  Given 11/06/18 1131)  sodium chloride 0.9 % bolus 1,000 mL (0 mLs Intravenous Stopped 11/06/18 1213)    Followed by  sodium chloride 0.9 % bolus 1,000 mL (1,000 mLs Intravenous Not Given 11/06/18 1131)    Followed by  0.9 %  sodium chloride infusion (0 mLs Intravenous Stopped 11/06/18 1213)  vancomycin (VANCOCIN) IVPB 1000 mg/200 mL premix (has no administration in time range)  ceFEPIme (MAXIPIME) 1 g in sodium chloride 0.9 % 100 mL IVPB (has no administration in time range)  ceFEPIme (MAXIPIME) 2 g in sodium chloride 0.9 % 100 mL IVPB (0 g Intravenous Stopped 11/06/18 1152)  metroNIDAZOLE (FLAGYL) IVPB 500 mg (0 mg Intravenous Stopped 11/06/18 1256)  vancomycin (VANCOCIN) IVPB 1000 mg/200 mL premix (0 mg Intravenous Stopped 11/06/18 1224)  acetaminophen (TYLENOL) suppository 650 mg (650 mg Rectal Given 11/06/18 1112)     Initial Impression / Assessment and Plan / ED Course  I have reviewed the triage vital signs and the nursing notes.  Pertinent labs & imaging results that were available during my care of the patient were reviewed by me and considered in my medical decision making (see chart for details).        BP 131/75 (BP Location: Right Arm)   Pulse (!) 119   Temp 100.3 F (37.9 C) (Oral)   Resp 15   Ht 6' (1.829 m)   Wt 86.2 kg   SpO2 90%   BMI 25.77 kg/m    Final Clinical Impressions(s) / ED Diagnoses   Final diagnoses:  Bronchopneumonia  Hypoxia    ED Discharge Orders    None     10:50 AM Patient with known history of aspiration pneumonia here with shortness of breath productive cough and fever.  He meets sepsis criteria based on elevated temperature,  tachycardia, and hypoxia with an O2 of 90% on 3 L of O2.  Code sepsis initiated, broad-spectrum antibiotic to cover for potential aspiration pneumonia which include cefepime, Flagyl, and vancomycin.  Patient will receive IV fluid at 30 mL/kg per protocol.  Work-up initiated.  He does have a history of opiate abuse however he was not pinpoint.  Suspect delirium secondary to infection. Care discussed with DR. Clarene DukeMcManus.  11:24 AM Chest x-ray with finding concerning for multilobar bronchopneumonia, most severe in in either the right middle or lower lobe with additional involvement of the right upper lobe and some patchy ill defined opacity in the left lung base as well.  Radiologist mentioned that this is not classic for aspiration pneumonia and could be reflective of a variety of infectious agent, including viral pneumonia.  Given for potential COVID-19, patient will be placed in both contact and droplet precaution, will get admitted to the hospital with further testing.  I have reached out to family members and spoke with his fiance through the phone.  She mentioned patient was doing fine yesterday and both were practicing social distancing and have not been exposed to any one that has tested positive for Covid-19.  She did recall his symptoms of cough, and lethargy started approximately 30 minutes after he ate dinner.  She was concerned that he may have aspirated his food.  He had history of opiate abuse however according to fianc has been avoiding opiate medication for approximately a year.  He does have medication for his anxiety which he does take more than he should.  1:02 PM O2 sats improves with 3L of supplemental Oxygen.  Labs are reassuring. Normal lactic acid, normal WBC. Flu  test negative. COVID-19 pending. UDS shows benzo positive. UA negative.   1:10 PM Appreciate consultation from [facility] Dr. Mariea Clonts who agrees to see pt in the ER and will help facilitate transfer to [facility] for admission.   1:38 PM Sepsis reassessment done.    Fayrene Helper, PA-C 11/06/18 1338    Samuel Jester, DO 11/09/18 1420

## 2018-11-06 NOTE — ED Notes (Signed)
2nd set of cultures obtained

## 2018-11-06 NOTE — ED Notes (Signed)
Per provider adm only 2000 ml fluid bolus

## 2018-11-06 NOTE — H&P (Signed)
Patient Demographics:    Christian Ellison, is a 58 y.o. male  MRN: [id number]   DOB - 15-Aug-1961  Admit Date - 11/06/2018  Outpatient Primary MD for the patient is [facility]   Assessment & Plan:    Principal Problem:   Bil PNA (pneumonia)--possible aspiration/needs COVID rule out Active Problems:   Acute Respiratory failure with hypoxia (2/2 Asp PNA)   Hx of hepatitis C   Depression   Anxiety   Acute respiratory failure with hypoxia (HCC)   Hypertension   Esophageal stricture/stenosis/esophageal dysmotility with recurrent aspiration   Polysubstance abuse ([place])   Esophageal dysmotility/Esophageal stricture/stenosis/esophageal dysmotility with recurrent aspiration   Recurrent aspiration pneumonia ([place])    1)Bil PNA-----Please  see HPI, clinical presentation more consistent with aspiration pneumonia given patient's known history with esophageal problems and aspiration issues, as well as difficulty swallowing with dinner last evening, deep sleep secondary to Xanax use, aspiration overnight/this morning--- acute onset of respiratory symptoms with hypoxia--- all of this points to possible aspiration pneumonia,  However EDP initiated COVID 19 testing and isolation procedures based on-   chest x-ray read by radiologist as possible viral pneumonia given negative influenza test .. Also patient does have mild lymphopenia----Resp panel test is pending. Treat empirically with IV Unasyn for possible aspiration pneumonia pending cultures, give mucolytics and bronchodilators as ordered  2) sepsis secondary to pneumonia --- patient met sepsis criteria on admission--with fever, tachycardia up to the 120s, tachypneic with respiratory rate in the mid 20s, significant HYPOXIA, soft blood pressures with  systolic blood pressure in the mid 90s--- lactic acid is not elevated, no leukocytosis----mild lymphopenia noted, EDP.Marland KitchenMarland Kitchen Initiated fluid boluses   3)H/o Hep C----ALT is 52, AST is 39 with alk phos of 67 and total bili of 0.5, pt had Liver biopsy in 2014 revealed Grade 2 inflammation and stage 0 fibrosis--- AFP was not elevated, patient will follow-up as outpatient for possible treatment  4)Dysphagia--history of esophageal spasms/stricture/stenosis and dysmotility disorders -history of prior esophageal stenosis/stricture requiring dilatation history of recurrent aspiration with episodes of aspiration pneumonia at times requiring intubation and ventilation,  Previous Esophageal Manometry: Revealed that patient has incomplete relaxation of LES/E GJ outflow obstruction.Criterion for achalasia not met... Continue Protonix, patient may need speech pathologist evaluation, on further GI input after his hospital stay----we will hold off on elective consults during this admission due to concerns about COVID 19   5)H/o  Chronic Pain/Depression/Anxiety and Polysubstance dependence including opiates and Benzos----be judicious with opiates and benzos, patient currently dealing with going grief regarding the recent passing away of his 41 year old son-----he was attending grief counseling as an outpatient prior to admission  6)Hypothyroidism--- stable, last TSH was 0.67, continue levothyroxine 25 mcg daily  7)HTN--- blood pressure soft, hold Atenolol 25 mg for now until BP recovers   CXR IMPRESSION: 1. Findings are concerning for multilobar bronchopneumonia, most severe in either the right middle or lower lobe, but with additional  involvement of the right upper lobe, and some patchy ill-defined opacities at the left lung base is well. This pattern is not classic for aspiration pneumonia, and could be reflective of a variety of infectious agents, including viral pneumonia.    COVID subjective risk assessment:    Low Risk---   has known source of infection (given history of recurrent aspiration in the setting of esophageal dysphagia/stricture/stenosis and given history of emesis while asleep overnight... And given lack of known exposure or recent travel patient clinically is Low risk for COVID, however radiological findings raises concern about possible Viral PNA--???? COVID 19 infection so EDP initiated COVID 19- testing and isolation "protocol" Physician PPE: face shield, N95 or surgical mask, gloves , gown Patient PPE: Mask COVID Testing:  indicated per current ID/[place] guidelines   With History of - Reviewed by me  Past Medical History:  Diagnosis Date  . Anxiety   . Aspiration pneumonia ([place]) 05/26/2018   RLL  . Chronic pain   . GERD (gastroesophageal reflux disease)   . 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   Marland Kitchen Opiate abuse, continuous ([place])       Past Surgical History:  Procedure Laterality Date  . BIOPSY  03/18/2018   Procedure: BIOPSY;  Surgeon: Rogene Houston, MD;  Location[facility];  Service: Endoscopy;;  gastric   . COLONOSCOPY N/A 11/22/2013   Dr. Laural Golden: Normal except for hemorrhoids.  Next colonoscopy 10 years.  . ESOPHAGEAL DILATION N/A 03/18/2018   Procedure: ESOPHAGEAL DILATION;  Surgeon: Rogene Houston, MD;  Location: [facility];  Service: Endoscopy;  Laterality: N/A;  . ESOPHAGEAL MANOMETRY N/A 06/15/2018   Procedure: ESOPHAGEAL MANOMETRY (EM);  Surgeon: Mauri Pole, MD;  Location: [facility];  Service: Endoscopy;  Laterality: N/A;  . ESOPHAGOGASTRODUODENOSCOPY (EGD) WITH PROPOFOL N/A 03/18/2018   Dr. Laural Golden: Benign-appearing mild esophageal stenosis proximal to the GE J, status post dilation.  2 cm hiatal hernia.  Gastritis, biopsies negative for H. pylori  . Fracture Right Leg     Patient has rod/screws in this leg  . Rotor Cuff  2012   Right Shoulder    Chief Complaint  Patient presents with  . Shortness of Breath      HPI:     Christian Ellison  is a 58 y.o. male with past medical history relevant for history of hep C,  h/o esophageal spasms and strictures/stenosis with history of recurrent aspiration pneumonia in the past requiring intubation and ventilation--as well as history of chronic pain and anxiety with opiate and benzo dependence--presented by EMS with altered mentation and hypoxia.  According to patient's girlfriend pulse ox read 66% on RA, EMS got 72% on RA. No O2 use at home at baseline.  Patient presented with cough, shortness of breath, hypoxia and temp was 100.3, pt had V/ in bed this morning..... Raising concerns about possible aspiration...   While eating dinner yesterday patient had some trouble swallowing, he had taking some Xanax/benzos prior to going to bed apparently went into a deep sleep and had emesis while asleep overnight  No diarrhea   In ED... Patient is tachycardic up to the 120s, tachypneic with respiratory rate in the mid 20s, soft blood pressures with systolic blood pressure in the mid 90s--- lactic acid is not elevated, no leukocytosis----troponin was negative  EDP.Marland KitchenMarland Kitchen Initiated fluid boluses and give antibiotics including Flagyl, Vanco and cefepime to cover for possible aspiration pneumonia , however chest x-ray read by radiologist  as possible viral pneumonia given negative influenza test EDP initiated COVID -19 testing procedures... Also patient does have mild lymphopenia  CXR IMPRESSION: 1. Findings are concerning for multilobar bronchopneumonia, most severe in either the right middle or lower lobe, but with additional involvement of the right upper lobe, and some patchy ill-defined opacities at the left lung base is well. This pattern is not classic for aspiration pneumonia, and could be reflective of a variety of infectious agents, including viral pneumonia.    COVID subjective risk assessment:   Low Risk---   has known source of infection (given history of recurrent aspiration  in the setting of esophageal dysphagia/stricture/stenosis and given history of emesis while asleep overnight... And given lack of known exposure or recent travel patient clinically is Low risk for COVID, however radiological findings raises concern about possible Viral PNA--???? COVID 19 infection so EDP initiated COVID 19- testing and isolation "protocol" Physician PPE: face shield, N95 or surgical mask, gloves , gown Patient PPE: Mask COVID Testing:  indicated per current ID/[place] guidelines      Review of systems:    In addition to the HPI above,   A full Review of  Systems was done, all other systems reviewed are negative except as noted above in HPI , .    Social History:  Reviewed by me    Social History   Tobacco Use  . Smoking status: Never Smoker  . Smokeless tobacco: Never Used  Substance Use Topics  . Alcohol use: No    Comment: Patient states that it has been over a year     Family History :  Reviewed by me    Family History  Problem Relation Age of Onset  . Breast cancer Mother   . Colon cancer Father   . Bladder Cancer Father   . Prostate cancer Father   . Hypertension Sister   . Hypothyroidism Sister   . Healthy Sister   . Healthy Daughter   . Healthy Son   . Healthy Son      Home Medications:   Prior to Admission medications   Medication Sig Start Date End Date Taking? Authorizing Provider  albuterol (PROVENTIL HFA;VENTOLIN HFA) 108 (90 Base) MCG/ACT inhaler Inhale 2 puffs into the lungs every 6 (six) hours as needed for wheezing or shortness of breath. 08/22/18  Yes Rodriguez-Southworth, Sunday Spillers, PA-C  atenolol (TENORMIN) 25 MG tablet Take 25 mg by mouth daily.   Yes [provider]  levothyroxine (SYNTHROID, LEVOTHROID) 25 MCG tablet Take 25 mcg by mouth daily. 05/13/18  Yes [provider]  Multiple Vitamin (MULTIVITAMIN WITH MINERALS) TABS tablet Take 1 tablet by mouth daily. 50+   Yes [provider]  pantoprazole  (PROTONIX) 40 MG tablet Take 1 tablet (40 mg total) by mouth 2 (two) times daily. 08/22/18 11/06/18 Yes Rodriguez-Southworth, Sunday Spillers, PA-C     Allergies:    No Known Allergies   Physical Exam:   Vitals  Blood pressure 138/65, pulse 87, temperature 98.4 F (36.9 C), temperature source Oral, resp. rate 19, height 6' (1.829 m), weight 86.2 kg, SpO2 95 %.   Today's Vitals   11/06/18 1400 11/06/18 1430 11/06/18 1500 11/06/18 1530  BP: 107/66 (!) 96/55 116/62 138/65  Pulse: (!) 106 84 81 87  Resp: '19 14 17 19  ' Temp:      TempSrc:      SpO2: 94% 93% 93% 95%  Weight:      Height:      PainSc:  Body mass index is 25.77 kg/m.   Physical Examination: General appearance - alert, well appearing, and in no distress, speaking in sentences at the time of my evaluation Mental status - alert, oriented to person, place, and time,  Nose- [place] 2 L/min Eyes - sclera anicteric Neck - supple, no JVD elevation , Chest -diminished bilaterally with scattered rhonchi, no wheezing Heart - S1 and S2 normal, tachycardic earlier Abdomen - soft, nontender, nondistended, no masses or organomegaly Neurological - screening mental status exam normal, neck supple without rigidity, cranial nerves II through XII intact, DTR's normal and symmetric Extremities - no pedal edema noted, intact peripheral pulses  Skin - warm, dry     Data Review:    CBC Recent Labs  Lab 11/06/18 1100  WBC 5.5  HGB 16.2  HCT 50.2  PLT 178  MCV 93.0  MCH 30.0  MCHC 32.3  RDW 13.0  LYMPHSABS 0.6*  MONOABS 0.3  EOSABS 0.0  BASOSABS 0.0   ------------------------------------------------------------------------------------------------------------------  Chemistries  Recent Labs  Lab 11/06/18 1150  NA 137  K 4.2  CL 104  CO2 25  GLUCOSE 119*  BUN 14  CREATININE 0.78  CALCIUM 8.3*  AST 39  ALT 52*  ALKPHOS 67  BILITOT 0.5    ------------------------------------------------------------------------------------------------------------------ estimated creatinine clearance is 113.2 mL/min (by C-G formula based on SCr of 0.78 mg/dL). ------------------------------------------------------------------------------------------------------------------ No results for input(s): TSH, T4TOTAL, T3FREE, THYROIDAB in the last 72 hours.  Invalid input(s): FREET3   Coagulation profile Recent Labs  Lab 11/06/18 1150  INR 1.0   ------------------------------------------------------------------------------------------------------------------- No results for input(s): DDIMER in the last 72 hours. -------------------------------------------------------------------------------------------------------------------  Cardiac Enzymes Recent Labs  Lab 11/06/18 1150  TROPONINI <0.03   ------------------------------------------------------------------------------------------------------------------    Component Value Date/Time   BNP 92.0 02/03/2018 0507     ---------------------------------------------------------------------------------------------------------------  Urinalysis    Component Value Date/Time   COLORURINE YELLOW 11/06/2018 1042   APPEARANCEUR CLEAR 11/06/2018 1042   LABSPEC 1.011 11/06/2018 1042   PHURINE 6.0 11/06/2018 1042   GLUCOSEU NEGATIVE 11/06/2018 1042   HGBUR NEGATIVE 11/06/2018 1042   BILIRUBINUR NEGATIVE 11/06/2018 1042   KETONESUR NEGATIVE 11/06/2018 1042   PROTEINUR NEGATIVE 11/06/2018 1042   NITRITE NEGATIVE 11/06/2018 1042   LEUKOCYTESUR NEGATIVE 11/06/2018 1042    ----------------------------------------------------------------------------------------------------------------   Imaging Results:    Dg Chest Port 1 View  Result Date: 11/06/2018 CLINICAL DATA:  58 year old male with history of fever. History of esophageal spasms in strictures. Prior history of aspiration pneumonia. EXAM:  PORTABLE CHEST 1 VIEW COMPARISON:  Chest x-ray 08/22/2018. FINDINGS: New ill-defined airspace consolidation in the right lung base without obscuration of either the right heart border or right hemidiaphragm (potentially within either the right middle or right lower lobe) which has hazy imaging characteristics. Additional less pronounced haziness is also noted in the region of the right upper lobe, and to a lesser extent in the left base. Relative sparing of the left upper lobe. No pleural effusions. No evidence of pulmonary edema. Heart size and mediastinal contours are within normal limits. IMPRESSION: 1. Findings are concerning for multilobar bronchopneumonia, most severe in either the right middle or lower lobe, but with additional involvement of the right upper lobe, and some patchy ill-defined opacities at the left lung base is well. This pattern is not classic for aspiration pneumonia, and could be reflective of a variety of infectious agents, including viral pneumonia. These results were called by telephone at the time of interpretation on 11/06/2018 at 11:04 am to Dr. Francine Graven,  who verbally acknowledged these results. Electronically Signed   By: Vinnie Langton M.D.   On: 11/06/2018 11:04    Radiological Exams on Admission: Dg Chest Port 1 View  Result Date: 11/06/2018 CLINICAL DATA:  58 year old male with history of fever. History of esophageal spasms in strictures. Prior history of aspiration pneumonia. EXAM: PORTABLE CHEST 1 VIEW COMPARISON:  Chest x-ray 08/22/2018. FINDINGS: New ill-defined airspace consolidation in the right lung base without obscuration of either the right heart border or right hemidiaphragm (potentially within either the right middle or right lower lobe) which has hazy imaging characteristics. Additional less pronounced haziness is also noted in the region of the right upper lobe, and to a lesser extent in the left base. Relative sparing of the left upper lobe. No  pleural effusions. No evidence of pulmonary edema. Heart size and mediastinal contours are within normal limits. IMPRESSION: 1. Findings are concerning for multilobar bronchopneumonia, most severe in either the right middle or lower lobe, but with additional involvement of the right upper lobe, and some patchy ill-defined opacities at the left lung base is well. This pattern is not classic for aspiration pneumonia, and could be reflective of a variety of infectious agents, including viral pneumonia. These results were called by telephone at the time of interpretation on 11/06/2018 at 11:04 am to Dr. Francine Graven, who verbally acknowledged these results. Electronically Signed   By: Vinnie Langton M.D.   On: 11/06/2018 11:04    DVT Prophylaxis -SCD  /heparin AM Labs Ordered, also please review Full Orders  Family Communication: Admission, patients condition and plan of care including tests being ordered have been discussed with the patient  who indicate understanding and agree with the plan   Code Status - Full Code  Likely DC to  [facility] Step-down (COVID Isolation)  Condition   stable  Roxan Hockey M.D on 11/06/2018 at 3:55 PM Go to [email or web address] -  for contact info  [facility] - Office  [phone]

## 2018-11-06 NOTE — ED Notes (Signed)
First set of cultures drawn and taken to lab

## 2018-11-06 NOTE — ED Triage Notes (Signed)
Pt has hx of esophageal spasms and strictures, had aspiration pneumonia several months ago. Per family pulse ox read 66% on RA, EMS got 72% on RA. No O2 use at home. Febrile upon arrival. Pt had V/ in bed this morning.

## 2018-11-06 NOTE — ED Notes (Signed)
ED TO INPATIENT HANDOFF REPORT  ED Nurse Name and Phone #: Maykel Reitter  S Name/Age/Gender Christian Ellison 58 y.o. male Room/Bed: APA02/APA02  Code Status   Code Status: Full Code  Home/SNF/Other Home Patient oriented to: self Is this baseline? Yes   Triage Complete: Triage complete  Chief Complaint Shortness of Breath  Triage Note Pt has hx of esophageal spasms and strictures, had aspiration pneumonia several months ago. Per family pulse ox read 66% on RA, EMS got 72% on RA. No O2 use at home. Febrile upon arrival. Pt had V/ in bed this morning.    Allergies No Known Allergies  Level of Care/Admitting Diagnosis ED Disposition    ED Disposition Condition Comment   Admit  Hospital Area: [facility] [[id number]]  Level of Care: Stepdown [14]  Admit to SDU based on following criteria: Other see comments  Comments: Low Risk COVID--- Step Down---- Low Risk COVID -19 Rule out  Diagnosis: PNA (pneumonia) [409811]  Admitting Physician: Marylyn Ishihara  Attending Physician: Marylyn Ishihara  Estimated length of stay: 3 - 4 days  Certification:: I certify this patient will need inpatient services for at least 2 midnights  Bed request comments: Step Down---- Low Risk COVID -19 Rule out  PT Class (Do Not Modify): Inpatient [101]  PT Acc Code (Do Not Modify): Private [1]       B Medical/Surgery History Past Medical History:  Diagnosis Date  . Anxiety   . Aspiration pneumonia (HCC) 05/26/2018   RLL  . Chronic pain   . GERD (gastroesophageal reflux disease)   . 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   Marland Kitchen Opiate abuse, continuous (HCC)    Past Surgical History:  Procedure Laterality Date  . BIOPSY  03/18/2018   Procedure: BIOPSY;  Surgeon: Malissa Hippo, MD;  [facility];  Service: Endoscopy;;  gastric   . COLONOSCOPY N/A 11/22/2013   Dr. Karilyn Cota: Normal except for hemorrhoids.  Next colonoscopy 10 years.  . ESOPHAGEAL  DILATION N/A 03/18/2018   Procedure: ESOPHAGEAL DILATION;  Surgeon: Malissa Hippo, MD;  Location: [facility];  Service: Endoscopy;  Laterality: N/A;  . ESOPHAGEAL MANOMETRY N/A 06/15/2018   Procedure: ESOPHAGEAL MANOMETRY (EM);  Surgeon: Napoleon Form, MD;  Location: [facility];  Service: Endoscopy;  Laterality: N/A;  . ESOPHAGOGASTRODUODENOSCOPY (EGD) WITH PROPOFOL N/A 03/18/2018   Dr. Karilyn Cota: Benign-appearing mild esophageal stenosis proximal to the GE J, status post dilation.  2 cm hiatal hernia.  Gastritis, biopsies negative for H. pylori  . Fracture Right Leg     Patient has rod/screws in this leg  . Rotor Cuff  2012   Right Shoulder     A IV Location/Drains/Wounds Patient Lines/Drains/Airways Status   Active Line/Drains/Airways    Name:   Placement date:   Placement time:   Site:   Days:   Peripheral IV 11/06/18 Right;Upper Arm   11/06/18    1048    Arm   less than 1   Peripheral IV 11/06/18 Left Antecubital   11/06/18    1111    Antecubital   less than 1          Intake/Output Last 24 hours  Intake/Output Summary (Last 24 hours) at 11/06/2018 1620 Last data filed at 11/06/2018 1543 Gross per 24 hour  Intake 3300 ml  Output 1300 ml  Net 2000 ml    Labs/Imaging Results for orders placed or performed during the hospital encounter of  11/06/18 (from the past 48 hour(s))  Urinalysis, Routine w reflex microscopic     Status: None   Collection Time: 11/06/18 10:42 AM  Result Value Ref Range   Color, Urine YELLOW YELLOW   APPearance CLEAR CLEAR   Specific Gravity, Urine 1.011 1.005 - 1.030   pH 6.0 5.0 - 8.0   Glucose, UA NEGATIVE NEGATIVE mg/dL   Hgb urine dipstick NEGATIVE NEGATIVE   Bilirubin Urine NEGATIVE NEGATIVE   Ketones, ur NEGATIVE NEGATIVE mg/dL   Protein, ur NEGATIVE NEGATIVE mg/dL   Nitrite NEGATIVE NEGATIVE   Leukocytes,Ua NEGATIVE NEGATIVE    Comment: Performed at [facility], [address] [place], [place] [address]  Influenza panel by PCR  (type A & B)     Status: None   Collection Time: 11/06/18 10:42 AM  Result Value Ref Range   Influenza A By PCR NEGATIVE NEGATIVE   Influenza B By PCR NEGATIVE NEGATIVE    Comment: (NOTE) The Xpert Xpress Flu assay is intended as an aid in the diagnosis of  influenza and should not be used as a sole basis for treatment.  This  assay is FDA approved for nasopharyngeal swab specimens only. Nasal  washings and aspirates are unacceptable for Xpert Xpress Flu testing. Performed at [facility], [address] [place], [place] [address]   Urine rapid drug screen (hosp performed)     Status: Abnormal   Collection Time: 11/06/18 10:43 AM  Result Value Ref Range   Opiates NONE DETECTED NONE DETECTED   Cocaine NONE DETECTED NONE DETECTED   Benzodiazepines POSITIVE (A) NONE DETECTED   Amphetamines NONE DETECTED NONE DETECTED   Tetrahydrocannabinol NONE DETECTED NONE DETECTED   Barbiturates NONE DETECTED NONE DETECTED    Comment: (NOTE) DRUG SCREEN FOR MEDICAL PURPOSES ONLY.  IF CONFIRMATION IS NEEDED FOR ANY PURPOSE, NOTIFY LAB WITHIN 5 DAYS. LOWEST DETECTABLE LIMITS FOR URINE DRUG SCREEN Drug Class                     Cutoff (ng/mL) Amphetamine and metabolites    1000 Barbiturate and metabolites    200 Benzodiazepine                 200 Tricyclics and metabolites     300 Opiates and metabolites        300 Cocaine and metabolites        300 THC                            50 Performed at [facility], [address] [place], [place] [address]   Lactic acid, plasma     Status: None   Collection Time: 11/06/18 11:00 AM  Result Value Ref Range   Lactic Acid, Venous 1.8 0.5 - 1.9 mmol/L    Comment: Performed at [facility], [address], [place], [place] [address]  CBC WITH DIFFERENTIAL     Status: Abnormal   Collection Time: 11/06/18 11:00 AM  Result Value Ref Range   WBC 5.5 4.0 - 10.5 K/uL   RBC 5.40 4.22 - 5.81 MIL/uL   Hemoglobin 16.2 13.0 - 17.0 g/dL   HCT 65.7 84.6 - 96.2 %    MCV 93.0 80.0 - 100.0 fL   MCH 30.0 26.0 - 34.0 pg   MCHC 32.3 30.0 - 36.0 g/dL   RDW 95.2 84.1 - 32.4 %   Platelets 178 150 - 400 K/uL   nRBC 0.0 0.0 -  0.2 %   Neutrophils Relative % 82 %   Neutro Abs 4.5 1.7 - 7.7 K/uL   Lymphocytes Relative 11 %   Lymphs Abs 0.6 (L) 0.7 - 4.0 K/uL   Monocytes Relative 5 %   Monocytes Absolute 0.3 0.1 - 1.0 K/uL   Eosinophils Relative 1 %   Eosinophils Absolute 0.0 0.0 - 0.5 K/uL   Basophils Relative 0 %   Basophils Absolute 0.0 0.0 - 0.1 K/uL   Immature Granulocytes 1 %   Abs Immature Granulocytes 0.04 0.00 - 0.07 K/uL    Comment: Performed at [facility], [address] [place], [place] [address]  Blood Culture (routine x 2)     Status: None (Preliminary result)   Collection Time: 11/06/18 11:06 AM  Result Value Ref Range   Specimen Description      RIGHT ANTECUBITAL BOTTLES DRAWN AEROBIC AND ANAEROBIC   Special Requests      Blood Culture results may not be optimal due to an inadequate volume of blood received in culture bottles Performed at [facility], [address] [place], [place] [address]    Culture PENDING    Report Status PENDING   Blood Culture (routine x 2)     Status: None (Preliminary result)   Collection Time: 11/06/18 11:06 AM  Result Value Ref Range   Specimen Description      LEFT ANTECUBITAL BOTTLES DRAWN AEROBIC AND ANAEROBIC   Special Requests      Blood Culture adequate volume Performed at [facility], [address] [place], [place] [address]    Culture PENDING    Report Status PENDING   Comprehensive metabolic panel     Status: Abnormal   Collection Time: 11/06/18 11:50 AM  Result Value Ref Range   Sodium 137 135 - 145 mmol/L   Potassium 4.2 3.5 - 5.1 mmol/L   Chloride 104 98 - 111 mmol/L   CO2 25 22 - 32 mmol/L   Glucose, Bld 119 (H) 70 - 99 mg/dL   BUN 14 6 - 20 mg/dL   Creatinine, Ser 0.860.78 0.61 - 1.24 mg/dL   Calcium 8.3 (L) 8.9 - 10.3 mg/dL   Total Protein 7.1 6.5 - 8.1 g/dL   Albumin 3.9 3.5  - 5.0 g/dL   AST 39 15 - 41 U/L   ALT 52 (H) 0 - 44 U/L   Alkaline Phosphatase 67 38 - 126 U/L   Total Bilirubin 0.5 0.3 - 1.2 mg/dL   GFR calc non Af Amer >60 >60 mL/min   GFR calc Af Amer >60 >60 mL/min   Anion gap 8 5 - 15    Comment: Performed at [facility], [address] [place], [place] [address]  Protime-INR     Status: None   Collection Time: 11/06/18 11:50 AM  Result Value Ref Range   Prothrombin Time 13.0 11.4 - 15.2 seconds   INR 1.0 0.8 - 1.2    Comment: (NOTE) INR goal varies based on device and disease states. Performed at [facility], [address] [place], [place] [address]   Troponin I -     Status: None   Collection Time: 11/06/18 11:50 AM  Result Value Ref Range   Troponin I <0.03 <0.03 ng/mL    Comment: Performed at [facility], [address] [place], [place] [address]  Lactic acid, plasma     Status: None   Collection Time: 11/06/18 12:57 PM  Result Value Ref Range   Lactic Acid, Venous 1.4 0.5 -  1.9 mmol/L    Comment: Performed at [facility], [address] [place], [place] [address]   Dg Chest [place] 1 View  Result Date: 11/06/2018 CLINICAL DATA:  58 year old male with history of fever. History of esophageal spasms in strictures. Prior history of aspiration pneumonia. EXAM: PORTABLE CHEST 1 VIEW COMPARISON:  Chest x-ray 08/22/2018. FINDINGS: New ill-defined airspace consolidation in the right lung base without obscuration of either the right heart border or right hemidiaphragm (potentially within either the right middle or right lower lobe) which has hazy imaging characteristics. Additional less pronounced haziness is also noted in the region of the right upper lobe, and to a lesser extent in the left base. Relative sparing of the left upper lobe. No pleural effusions. No evidence of pulmonary edema. Heart size and mediastinal contours are within normal limits. IMPRESSION: 1. Findings are concerning for multilobar bronchopneumonia, most severe in either  the right middle or lower lobe, but with additional involvement of the right upper lobe, and some patchy ill-defined opacities at the left lung base is well. This pattern is not classic for aspiration pneumonia, and could be reflective of a variety of infectious agents, including viral pneumonia. These results were called by telephone at the time of interpretation on 11/06/2018 at 11:04 am to Dr. Samuel Jester, who verbally acknowledged these results. Electronically Signed   By: Trudie Reed M.D.   On: 11/06/2018 11:04    Pending Labs Unresulted Labs (From admission, onward)    Start     Ordered   11/07/18 0500  Basic metabolic panel  Tomorrow morning,   R     11/06/18 1552   11/07/18 0500  CBC  Tomorrow morning,   R     11/06/18 1552   11/06/18 1602  Procalcitonin - Baseline  Add-on,   STAT     11/06/18 1601   11/06/18 1122  Novel Coronavirus, NAA (hospital order; send-out to ref lab)  (Novel Coronavirus, NAA [facility] Order; send-out to ref lab) with precautions panel)  Once,   R    Question Answer Comment  Current symptoms Fever and Shortness of breath   Excluded other viral illnesses No (testing not indicated)   Patient immune status Normal      11/06/18 1122   11/06/18 1103  Respiratory Panel by PCR  (Respiratory virus panel with precautions)  Once,   R     11/06/18 1102   11/06/18 1042  Urine culture  ONCE - STAT,   STAT     11/06/18 1042          Vitals/Pain Today's Vitals   11/06/18 1430 11/06/18 1500 11/06/18 1530 11/06/18 1600  BP: (!) 96/55 116/62 138/65 132/66  Pulse: 84 81 87   Resp: 14 17 19  (!) 21  Temp:      TempSrc:      SpO2: 93% 93% 95%   Weight:      Height:      PainSc:        Isolation Precautions Droplet and Contact precautions  Medications Medications  0.9 %  sodium chloride infusion (1,000 mLs Intravenous Not Given 11/06/18 1131)  sodium chloride 0.9 % bolus 1,000 mL (0 mLs Intravenous Stopped 11/06/18 1213)    Followed by  sodium  chloride 0.9 % bolus 1,000 mL (1,000 mLs Intravenous Not Given 11/06/18 1131)    Followed by  0.9 %  sodium chloride infusion (0 mLs Intravenous Stopped 11/06/18 1213)  multivitamin with minerals tablet 1 tablet (has no administration  in time range)  levothyroxine (SYNTHROID, LEVOTHROID) tablet 25 mcg (has no administration in time range)  pantoprazole (PROTONIX) EC tablet 40 mg (has no administration in time range)  albuterol (PROVENTIL HFA;VENTOLIN HFA) 108 (90 Base) MCG/ACT inhaler 2 puff (has no administration in time range)  sodium chloride flush (NS) 0.9 % injection 3 mL (has no administration in time range)  sodium chloride flush (NS) 0.9 % injection 3 mL (has no administration in time range)  0.9 %  sodium chloride infusion (has no administration in time range)  acetaminophen (TYLENOL) tablet 650 mg (has no administration in time range)    Or  acetaminophen (TYLENOL) suppository 650 mg (has no administration in time range)  traZODone (DESYREL) tablet 50 mg (has no administration in time range)  polyethylene glycol (MIRALAX / GLYCOLAX) packet 17 g (has no administration in time range)  ondansetron (ZOFRAN) tablet 4 mg (has no administration in time range)    Or  ondansetron (ZOFRAN) injection 4 mg (has no administration in time range)  heparin injection 5,000 Units (has no administration in time range)  guaiFENesin (MUCINEX) 12 hr tablet 600 mg (has no administration in time range)  atenolol (TENORMIN) tablet 25 mg (has no administration in time range)  ampicillin-sulbactam (UNASYN) 1.5 g in sodium chloride 0.9 % 100 mL IVPB (has no administration in time range)  ceFEPIme (MAXIPIME) 2 g in sodium chloride 0.9 % 100 mL IVPB (0 g Intravenous Stopped 11/06/18 1152)  metroNIDAZOLE (FLAGYL) IVPB 500 mg (0 mg Intravenous Stopped 11/06/18 1256)  vancomycin (VANCOCIN) IVPB 1000 mg/200 mL premix (0 mg Intravenous Stopped 11/06/18 1224)  acetaminophen (TYLENOL) suppository 650 mg (650 mg Rectal  Given 11/06/18 1112)  0.9 %  sodium chloride infusion ( Intravenous New Bag/Given 11/06/18 1307)    Mobility walks Moderate fall risk   Focused Assessments    R Recommendations: See Admitting Provider Note  Report given to:   Additional Notes: Airborne and contact precautions

## 2018-11-06 NOTE — ED Notes (Signed)
carelink in to transfer 

## 2018-11-06 NOTE — Progress Notes (Signed)
Pharmacy Antibiotic Note  Christian Ellison is a 58 y.o. male admitted on 11/06/2018 with sepsis vs pneumonia.  Pharmacy has been consulted for Unasyn,  Vancomycin and Cefepime have been d/c'd.  Plan: Unasyn 1.5gm IV q6hrs Monitor labs, progress, c/s  Height: 6' (182.9 cm) Weight: 190 lb (86.2 kg) IBW/kg (Calculated) : 77.6  Temp (24hrs), Avg:99.4 F (37.4 C), Min:98.4 F (36.9 C), Max:100.3 F (37.9 C)  Recent Labs  Lab 11/06/18 1100 11/06/18 1150 11/06/18 1257  WBC 5.5  --   --   CREATININE  --  0.78  --   LATICACIDVEN 1.8  --  1.4    Estimated Creatinine Clearance: 113.2 mL/min (by C-G formula based on SCr of 0.78 mg/dL).    No Known Allergies  Antimicrobials this admission: Flagyl 3/22 x 1 Vancomycin 3/22 >> 3/22 Cefepime 3/22 >>3/22 Unasyn 3/22 >>  Dose adjustments this admission:  Microbiology results: 3/22 BCx:  3/22 UCx:   3/22 Sputum:   3/22 MRSA PCR:   Thank you for allowing pharmacy to be a part of this patient's care.  Valrie Hart A 11/06/2018 4:08 PM

## 2018-11-06 NOTE — ED Notes (Signed)
2nd lactic collected

## 2018-11-06 NOTE — Progress Notes (Signed)
Pharmacy Antibiotic Note  Christian Ellison is a 58 y.o. male admitted on 11/06/2018 with sepsis vs pneumonia.  Pharmacy has been consulted for Vancomycin and Cefepime dosing.  Plan: Vancomycin 1gm IV every 8 hours.  Goal trough 15-20 mcg/mL.  Cefepime 2gm x 1 given in ED then 1gm IV q8hrs Monitor labs, progress, c/s  Height: 6' (182.9 cm) Weight: 190 lb (86.2 kg) IBW/kg (Calculated) : 77.6  Temp (24hrs), Avg:99.4 F (37.4 C), Min:98.4 F (36.9 C), Max:100.3 F (37.9 C)  Recent Labs  Lab 11/06/18 1100 11/06/18 1150  WBC 5.5  --   CREATININE  --  0.78  LATICACIDVEN 1.8  --     Estimated Creatinine Clearance: 113.2 mL/min (by C-G formula based on SCr of 0.78 mg/dL).    No Known Allergies  Antimicrobials this admission: Flagyl 3/22 x 1 Vancomycin 3/22 >>  Cefepime 3/22 >>  Dose adjustments this admission:  Microbiology results: 3/22 BCx:  3/22 UCx:   3/22 Sputum:   3/22 MRSA PCR:   Thank you for allowing pharmacy to be a part of this patient's care.  Valrie Hart A 11/06/2018 12:54 PM

## 2018-11-07 DIAGNOSIS — F1910 Other psychoactive substance abuse, uncomplicated: Secondary | ICD-10-CM

## 2018-11-07 DIAGNOSIS — Z8619 Personal history of other infectious and parasitic diseases: Secondary | ICD-10-CM

## 2018-11-07 DIAGNOSIS — K222 Esophageal obstruction: Secondary | ICD-10-CM

## 2018-11-07 DIAGNOSIS — I10 Essential (primary) hypertension: Secondary | ICD-10-CM

## 2018-11-07 DIAGNOSIS — J690 Pneumonitis due to inhalation of food and vomit: Secondary | ICD-10-CM

## 2018-11-07 DIAGNOSIS — F329 Major depressive disorder, single episode, unspecified: Secondary | ICD-10-CM

## 2018-11-07 LAB — RESPIRATORY PANEL BY PCR
Adenovirus: NOT DETECTED
Bordetella pertussis: NOT DETECTED
Chlamydophila pneumoniae: NOT DETECTED
Coronavirus 229E: NOT DETECTED
Coronavirus HKU1: NOT DETECTED
Coronavirus NL63: NOT DETECTED
Coronavirus OC43: NOT DETECTED
INFLUENZA A-RVPPCR: NOT DETECTED
Influenza B: NOT DETECTED
MYCOPLASMA PNEUMONIAE-RVPPCR: NOT DETECTED
Metapneumovirus: NOT DETECTED
PARAINFLUENZA VIRUS 4-RVPPCR: NOT DETECTED
Parainfluenza Virus 1: NOT DETECTED
Parainfluenza Virus 2: NOT DETECTED
Parainfluenza Virus 3: NOT DETECTED
Respiratory Syncytial Virus: NOT DETECTED
Rhinovirus / Enterovirus: NOT DETECTED

## 2018-11-07 NOTE — Progress Notes (Signed)
PROGRESS NOTE    Christian Ellison  WLS:[id number] DOB: 06-Sep-1960 DOA: 11/06/2018 PCP: Gwenlyn Saran, [place]   Brief Narrative:   Christian Ellison  is a 58 y.o. male with past medical history relevant for history of hep C,  h/o esophageal spasms and strictures/stenosis with history of recurrent aspiration pneumonia in the past requiring intubation and ventilation--as well as history of chronic pain and anxiety with opiate and benzo dependence presented by EMS with altered mentation and hypoxia.  According to patient's girlfriend pulse ox read 66% on RA, EMS got 72% on RA. No O2 use at home at baseline.  Patient presented with cough, shortness of breath, hypoxia and temp was 100.3, pt had vomited in bed this morning concerning for possible aspiration event.  While eating dinner yesterday patient had some trouble swallowing, he had taking some Xanax/benzos prior to going to bed apparently went into a deep sleep and had emesis while asleep overnight. No diarrhea reported.  In ED, Patient tachycardic up to the 120s, tachypneic with respiratory rate in the mid 20s, soft blood pressures with systolic blood pressure in the mid 90s. Llactic acid is not elevated, no leukocytosis; troponin was negative.  Pt was given fluid boluses; started on antibiotics including Flagyl, Vanco and cefepime to cover for possible aspiration pneumonia, however chest x-ray read by radiologist as possible viral pneumonia given negative influenza test. EDP initiated COVID -19 testing procedures. Also patient does have mild lymphopenia.   Assessment & Plan:   Principal Problem:   Bil PNA (pneumonia)--possible aspiration/needs COVID rule out Active Problems:   Hx of hepatitis C   Depression   Anxiety   Acute respiratory failure with hypoxia (HCC)   Hypertension   Esophageal stricture/stenosis/esophageal dysmotility with recurrent aspiration   Polysubstance abuse ([place])   Esophageal dysmotility/Esophageal  stricture/stenosis/esophageal dysmotility with recurrent aspiration   Recurrent aspiration pneumonia (HCC)   Acute Respiratory failure with hypoxia (2/2 Asp PNA)   Acute hypoxic respiratory failure Aspiration pneumonia Sepsis, present on admission Presenting from home with altered mental status, hypoxia in the setting of history of recurrent aspiration from underlying dysphasia/esophageal stricture.  Apparently he had issues with oral intake last night and went to bed and took some opiates/benzos to help him sleep.  Found early this morning by family to be hypoxic and altered with oxygen saturations reported 66% on room air.  With a temperature up to 100.3, WBC count 5.5, procalcitonin elevated 12.07 with a lactic acid of 1.8.  Chest x-ray noted multilobar bronchopneumonia mostly accompanying the right middle/lower lobes with also some left lung base density consistent with aspiration vs viral pneumonia.  Initially started on broad-spectrum antibiotics with vancomycin, cefepime, Flagyl.  COVID-19 testing performed by EDP; although low risk per family have been self isolating and no known contacts.  UDS was positive for benzos.  Respiratory viral panel/MRSA PCR/rapid influenza negative.  Girlfriend also reported that she was missing roughly 10.5 tablets of her Xanax. --Continue antibiotics with Unasyn 1.5 g IV every 6 hours --COVID-19 pending --Blood cultures x2: Pending --Contact/droplet isolation precaution --Aspiration precautions --Titrate supplemental oxygen to maintain SPO2 greater than 92% --Supportive care  Questionable unintentional overdose Graft reports missing 10.5 tablets of her Xanax this morning.  Patient was known to have taken some benzos and opiates last night to help with sleep and was found to be altered and hypoxic. --UDS positive for benzos --Avoid any sedating medications --Aspiration precautions --Continue to monitor on telemetry  Dysphagia History of esophageal  spasms/stricture/stenosis and  dysmotility disorders with prior esophageal stenosis/stricture requiring dilatation history of recurrent aspiration with episodes of aspiration pneumonia at times requiring intubation and ventilation.  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 --Continue Protonix BID --may need speech pathologist evaluation vs further GI input, but will hold off on elective consults until COVID 19 test results  Hx Hep C ALT is 52, AST is 39 with alk phos of 67 and total bili of 0.5. Liver biopsy in 2014 revealed Grade 2 inflammation and stage 0 fibrosis. AFP was not elevated. --continue outpatient outpatient follow-up for possible treatment  Hx Chronic Pain/Depression/Anxiety and Polysubstance dependence  Opiate and Benzo dependence. Will be cautious with opiates and benzos, patient currently dealing with ongoing grief regarding the recent passing away of his 30 year old son and he was attending grief counseling as an outpatient prior to admission. --no psych involvement at this time as being ruled out for SARS-COV-2.  Hypothyroidism stable, last TSH was 0.67 --continue levothyroxine 25 mcg daily  HTN Blood pressure soft on admission --hold Atenolol 25 mg for now until BP recovers   DVT prophylaxis: Heparin Code Status: Full code Family Communication: None Disposition Plan: Transfer to floor, continue droplet/contact isolation, continue on IV fluid hydration until mental status improves and can tolerate oral intake, further depending on clinical course but anticipate discharge home in 3-4 days.   Consultants:   none  Procedures:   none  Antimicrobials:   Vancomycin 3/22 -3/22  Cefepime 3/22 - 3/22  Metronidazole 3/22 - 3/22  Unasyn 3/22>>>   Subjective:  Patient seen and examined at bedside, alert but continues with intermittent drowsiness episodes.  Nursing reports girlfriend stated  this morning that she was missing 10.5 tabs of her Xanax; unclear if this was an intentional overdose possibly with UDS being positive for benzos on admission.  Patient states is thirsty and requesting something to drink this morning.  Also wants to make sure that his Protonix is restarted as has serious issues with reflux in the past.  No other complaints at this time.  Denies headache, no fever/chills/night sweats, no nausea/vomiting/diarrhea, no chest pain, no palpitations, no shortness of breath, no issues with bowel/bladder function, no cough/congestion.  No acute issues overnight per nursing staff.  Objective: Vitals:   11/07/18 0800 11/07/18 0900 11/07/18 1000 11/07/18 1100  BP: (!) 143/75 (!) 134/56 134/71 (!) 141/64  Pulse: 65 71 72 70  Resp: _0 Temp: 98.2 F (36.8 C)   98.4 F (36.9 C)  TempSrc: Axillary   Oral  SpO2: 96% 96% 99% 100%  Weight:      Height:        Intake/Output Summary (Last 24 hours) at 11/07/2018 1158 Last data filed at 11/07/2018 1130 Gross per 24 hour  Intake 5573.4 ml  Output 2800 ml  Net 2773.4 ml   Filed Weights   11/06/18 1028  Weight: 86.2 kg    Examination:  General exam: Appears calm and comfortable  Respiratory system: Coarse breath sounds bilaterally, respiratory effort normal.  On 3 L nasal cannula Cardiovascular system: S1 & S2 heard, RRR. No JVD, murmurs, rubs, gallops or clicks. No pedal edema. Gastrointestinal system: Abdomen is nondistended, soft and nontender. No organomegaly or masses felt. Normal bowel sounds heard. Central nervous system: Alert and oriented. No focal neurological deficits. Extremities: Symmetric 5 x 5 power. Skin: No rashes, lesions or ulcers Psychiatry: Judgement and insight appear normal. Mood & affect appropriate.  Data Reviewed: I have personally reviewed following labs and imaging studies  CBC: Recent Labs  Lab 11/06/18 1100  WBC 5.5  NEUTROABS 4.5  HGB 16.2  HCT 50.2  MCV 93.0  PLT  478   Basic Metabolic Panel: Recent Labs  Lab 11/06/18 1150  NA 137  K 4.2  CL 104  CO2 25  GLUCOSE 119*  BUN 14  CREATININE 0.78  CALCIUM 8.3*   GFR: Estimated Creatinine Clearance: 113.2 mL/min (by C-G formula based on SCr of 0.78 mg/dL). Liver Function Tests: Recent Labs  Lab 11/06/18 1150  AST 39  ALT 52*  ALKPHOS 67  BILITOT 0.5  PROT 7.1  ALBUMIN 3.9   No results for input(s): LIPASE, AMYLASE in the last 168 hours. No results for input(s): AMMONIA in the last 168 hours. Coagulation Profile: Recent Labs  Lab 11/06/18 1150  INR 1.0   Cardiac Enzymes: Recent Labs  Lab 11/06/18 1150  TROPONINI <0.03   BNP (last 3 results) No results for input(s): PROBNP in the last 8760 hours. HbA1C: No results for input(s): HGBA1C in the last 72 hours. CBG: No results for input(s): GLUCAP in the last 168 hours. Lipid Profile: No results for input(s): CHOL, HDL, LDLCALC, TRIG, CHOLHDL, LDLDIRECT in the last 72 hours. Thyroid Function Tests: No results for input(s): TSH, T4TOTAL, FREET4, T3FREE, THYROIDAB in the last 72 hours. Anemia Panel: No results for input(s): VITAMINB12, FOLATE, FERRITIN, TIBC, IRON, RETICCTPCT in the last 72 hours. Sepsis Labs: Recent Labs  Lab 11/06/18 1100 11/06/18 1257 11/06/18 1637  PROCALCITON  --   --  12.07  LATICACIDVEN 1.8 1.4  --     Recent Results (from the past 240 hour(s))  Respiratory Panel by PCR     Status: None   Collection Time: 11/06/18 11:03 AM  Result Value Ref Range Status   Adenovirus NOT DETECTED NOT DETECTED Final   Coronavirus 229E NOT DETECTED NOT DETECTED Final    Comment: (NOTE) The Coronavirus on the Respiratory Panel, DOES NOT test for the novel  Coronavirus (2019 nCoV)    Coronavirus HKU1 NOT DETECTED NOT DETECTED Final   Coronavirus NL63 NOT DETECTED NOT DETECTED Final   Coronavirus OC43 NOT DETECTED NOT DETECTED Final   Metapneumovirus NOT DETECTED NOT DETECTED Final   Rhinovirus / Enterovirus NOT  DETECTED NOT DETECTED Final   Influenza A NOT DETECTED NOT DETECTED Final   Influenza B NOT DETECTED NOT DETECTED Final   Parainfluenza Virus 1 NOT DETECTED NOT DETECTED Final   Parainfluenza Virus 2 NOT DETECTED NOT DETECTED Final   Parainfluenza Virus 3 NOT DETECTED NOT DETECTED Final   Parainfluenza Virus 4 NOT DETECTED NOT DETECTED Final   Respiratory Syncytial Virus NOT DETECTED NOT DETECTED Final   Bordetella pertussis NOT DETECTED NOT DETECTED Final   Chlamydophila pneumoniae NOT DETECTED NOT DETECTED Final   Mycoplasma pneumoniae NOT DETECTED NOT DETECTED Final    Comment: Performed at [facility], [place]. [address] [place], [place] [address]  Blood Culture (routine x 2)     Status: None (Preliminary result)   Collection Time: 11/06/18 11:06 AM  Result Value Ref Range Status   Specimen Description   Final    RIGHT ANTECUBITAL BOTTLES DRAWN AEROBIC AND ANAEROBIC   Special Requests   Final    Blood Culture results may not be optimal due to an inadequate volume of blood received in culture bottles   Culture   Final    NO GROWTH < 24 HOURS Performed at  [facility], [address], [place], [place] [address]    Report Status PENDING  Incomplete  Blood Culture (routine x 2)     Status: None (Preliminary result)   Collection Time: 11/06/18 11:06 AM  Result Value Ref Range Status   Specimen Description   Final    LEFT ANTECUBITAL BOTTLES DRAWN AEROBIC AND ANAEROBIC   Special Requests Blood Culture adequate volume  Final   Culture   Final    NO GROWTH < 24 HOURS Performed at [facility], [address] [place], [place] [address]    Report Status PENDING  Incomplete  Novel Coronavirus, NAA (hospital order; send-out to ref lab)     Status: None (Preliminary result)   Collection Time: 11/06/18 11:22 AM  Result Value Ref Range Status   SARS-CoV-2, NAA PENDING NOT DETECTED Incomplete  MRSA PCR Screening     Status: None   Collection Time: 11/06/18  7:02 PM  Result Value Ref  Range Status   MRSA by PCR NEGATIVE NEGATIVE Final    Comment:        The GeneXpert MRSA Assay (FDA approved for NASAL specimens only), is one component of a comprehensive MRSA colonization surveillance program. It is not intended to diagnose MRSA infection nor to guide or monitor treatment for MRSA infections. Performed at [facility], [place] [address], [place],  [address]          Radiology Studies: Dg Chest Port 1 View  Result Date: 11/06/2018 CLINICAL DATA:  58 year old male with history of fever. History of esophageal spasms in strictures. Prior history of aspiration pneumonia. EXAM: PORTABLE CHEST 1 VIEW COMPARISON:  Chest x-ray 08/22/2018. FINDINGS: New ill-defined airspace consolidation in the right lung base without obscuration of either the right heart border or right hemidiaphragm (potentially within either the right middle or right lower lobe) which has hazy imaging characteristics. Additional less pronounced haziness is also noted in the region of the right upper lobe, and to a lesser extent in the left base. Relative sparing of the left upper lobe. No pleural effusions. No evidence of pulmonary edema. Heart size and mediastinal contours are within normal limits. IMPRESSION: 1. Findings are concerning for multilobar bronchopneumonia, most severe in either the right middle or lower lobe, but with additional involvement of the right upper lobe, and some patchy ill-defined opacities at the left lung base is well. This pattern is not classic for aspiration pneumonia, and could be reflective of a variety of infectious agents, including viral pneumonia. These results were called by telephone at the time of interpretation on 11/06/2018 at 11:04 am to Dr. Francine Graven, who verbally acknowledged these results. Electronically Signed   By: Vinnie Langton M.D.   On: 11/06/2018 11:04        Scheduled Meds:  atenolol  25 mg Oral Daily   Chlorhexidine  Gluconate Cloth  6 each Topical Daily   guaiFENesin  600 mg Oral BID   heparin  5,000 Units Subcutaneous Q8H   levothyroxine  25 mcg Oral Daily   mouth rinse  15 mL Mouth Rinse BID   multivitamin with minerals  1 tablet Oral Daily   pantoprazole  40 mg Oral BID   sodium chloride flush  3 mL Intravenous Q12H   Continuous Infusions:  sodium chloride     Followed by   sodium chloride Stopped (11/06/18 1213)   sodium chloride     sodium chloride Stopped (11/07/18 1101)   ampicillin-sulbactam (UNASYN) IV Stopped (11/07/18 1130)  LOS: 1 day    Time spent: 32 minutes    Zebbie Ace J [place] DO [facility] Pager [phone]  If 7PM-7AM, please contact night-coverage [email or web address] Password [facility] 11/07/2018, 11:58 AM

## 2018-11-08 ENCOUNTER — Encounter (HOSPITAL_COMMUNITY): Payer: Medicaid Other

## 2018-11-08 LAB — BASIC METABOLIC PANEL
Anion gap: 9 (ref 5–15)
BUN: 12 mg/dL (ref 6–20)
CO2: 28 mmol/L (ref 22–32)
Calcium: 8.7 mg/dL — ABNORMAL LOW (ref 8.9–10.3)
Chloride: 101 mmol/L (ref 98–111)
Creatinine, Ser: 0.69 mg/dL (ref 0.61–1.24)
GFR calc non Af Amer: >60 mL/min (ref >60)
Glucose, Bld: 103 mg/dL — ABNORMAL HIGH (ref 70–99)
Potassium: 3.6 mmol/L (ref 3.5–5.1)
Sodium: 138 mmol/L (ref 135–145)

## 2018-11-08 LAB — CBC
HCT: 36.7 % — ABNORMAL LOW (ref 39.0–52.0)
Hemoglobin: 12 g/dL — ABNORMAL LOW (ref 13.0–17.0)
MCH: 30.4 pg (ref 26.0–34.0)
MCHC: 32.7 g/dL (ref 30.0–36.0)
MCV: 92.9 fL (ref 80.0–100.0)
NRBC: 0 % (ref 0.0–0.2)
Platelets: 146 10*3/uL — ABNORMAL LOW (ref 150–400)
RBC: 3.95 MIL/uL — ABNORMAL LOW (ref 4.22–5.81)
RDW: 12.7 % (ref 11.5–15.5)
WBC: 7 10*3/uL (ref 4.0–10.5)

## 2018-11-08 LAB — MAGNESIUM: Magnesium: 2.1 mg/dL (ref 1.7–2.4)

## 2018-11-08 LAB — URINE CULTURE: Culture: NO GROWTH

## 2018-11-08 NOTE — TOC Initial Note (Signed)
Transition of Care [facility]) - Initial/Assessment Note    Patient Details  Name: Christian Ellison MRN: [id number] Date of Birth: 10-21-1960  Transition of [facility]) CM/SW Contact:    Geni Bers, RN Phone Number: 11/08/2018, 2:57 PM  Clinical Narrative:                   Expected Discharge Plan: Home/Self Care Barriers to Discharge: No Barriers Identified   Patient Goals and CMS Choice        Expected Discharge Plan and Services Expected Discharge Plan: Home/Self Care   Discharge Planning Services: CM Consult   Living arrangements for the past 2 months: Single Family Home                          Prior Living Arrangements/Services Living arrangements for the past 2 months: Single Family Home Lives with:: Significant Other Patient language and need for interpreter reviewed:: No Do you feel safe going back to the place where you live?: Yes               Activities of Daily Living Home Assistive Devices/Equipment: None ADL Screening (condition at time of admission) Patient's cognitive ability adequate to safely complete daily activities?: Yes Is the patient deaf or have difficulty hearing?: No Does the patient have difficulty seeing, even when wearing glasses/contacts?: No Does the patient have difficulty concentrating, remembering, or making decisions?: No Patient able to express need for assistance with ADLs?: Yes Does the patient have difficulty dressing or bathing?: No Independently performs ADLs?: Yes (appropriate for developmental age) Does the patient have difficulty walking or climbing stairs?: No Weakness of Legs: None Weakness of Arms/Hands: None  Permission Sought/Granted                  Emotional Assessment              Admission diagnosis:  Bronchopneumonia [J18.0] Hypoxia [R09.02] PNA (pneumonia) [J18.9] Patient Active Problem List   Diagnosis Date Noted  . Bil PNA (pneumonia)--possible aspiration/needs COVID rule out  11/06/2018  . Acute Respiratory failure with hypoxia (2/2 Asp PNA) 11/06/2018  . Recurrent aspiration pneumonia (HCC)   . Fever 06/09/2018  . Tachycardia 06/09/2018  . Hypoxia 06/09/2018  . Hypotension 06/09/2018  . Esophageal dysmotility/Esophageal stricture/stenosis/esophageal dysmotility with recurrent aspiration   . Aspiration pneumonia of right lower lobe (HCC)   . Sepsis (HCC) 05/26/2018  . Pulmonary edema 02/02/2018  . HCAP (healthcare-associated pneumonia) 02/02/2018  . Weakness generalized 02/02/2018  . Hypertension 02/02/2018  . CHF (congestive heart failure) (HCC) 02/02/2018  . Nausea & vomiting 02/02/2018  . Dysphagia 02/02/2018  . Esophageal stricture/stenosis/esophageal dysmotility with recurrent aspiration 02/02/2018  . Esophageal stenosis 02/02/2018  . GERD (gastroesophageal reflux disease) 02/02/2018  . Polysubstance abuse (HCC) 02/02/2018  . Acute respiratory failure with hypoxia (HCC)   . Transaminitis   . Elevated troponin   . Overdose 01/02/2018  . Hx of hepatitis C 01/17/2013  . Depression 01/17/2013  . Anxiety 01/17/2013  . Right knee pain 01/17/2013   PCP:  Elmer Picker [facility] Pharmacy:   [facility] [address], [place] - [address] [place] [address] Phone: [phone] Fax: [phone]     Social Determinants of Health (SDOH) Interventions    Readmission Risk Interventions No flowsheet data found.

## 2018-11-08 NOTE — Progress Notes (Signed)
°PROGRESS NOTE ° ° ° °Christian Ellison  MRN:[id number] DOB: 01/10/1961 DOA: 11/06/2018 °PCP: [facility]  ° °Brief Narrative:  ° °Christian Ellison  is a 58 y.o. male with past medical history relevant for history of hep C,  h/o esophageal spasms and strictures/stenosis with history of recurrent aspiration pneumonia in the past requiring intubation and ventilation--as well as history of chronic pain and anxiety with opiate and benzo dependence presented by EMS with altered mentation and hypoxia.  According to patient's girlfriend pulse ox read 66% on RA, EMS got 72% on RA. No O2 use at home at baseline.  Patient presented with cough, shortness of breath, hypoxia and temp was 100.3, pt had vomited in bed this morning concerning for possible aspiration event. °  °While eating dinner yesterday patient had some trouble swallowing, he had taking some Xanax/benzos prior to going to bed apparently went into a deep sleep and had emesis while asleep overnight. No diarrhea reported. °  °In ED, Patient tachycardic up to the 120s, tachypneic with respiratory rate in the mid 20s, soft blood pressures with systolic blood pressure in the mid 90s. Llactic acid is not elevated, no leukocytosis; troponin was negative.  Pt was given fluid boluses; started on antibiotics including Flagyl, Vanco and cefepime to cover for possible aspiration pneumonia, however chest x-ray read by radiologist as possible viral pneumonia given negative influenza test. EDP initiated COVID -19 testing procedures. Also patient does have mild lymphopenia. ° ° °Assessment & Plan: °  °Principal Problem: °  Bil PNA (pneumonia)--possible aspiration/needs COVID rule out °Active Problems: °  Hx of hepatitis C °  Depression °  Anxiety °  Acute respiratory failure with hypoxia (HCC) °  Hypertension °  Esophageal stricture/stenosis/esophageal dysmotility with recurrent aspiration °  Polysubstance abuse (HCC) °  Esophageal dysmotility/Esophageal  stricture/stenosis/esophageal dysmotility with recurrent aspiration °  Recurrent aspiration pneumonia (HCC) °  Acute Respiratory failure with hypoxia (2/2 Asp PNA) ° ° °Acute hypoxic respiratory failure °Aspiration pneumonia °Sepsis, present on admission °Presenting from home with altered mental status, hypoxia in the setting of history of recurrent aspiration from underlying dysphasia/esophageal stricture.  Apparently he had issues with oral intake last night and went to bed and took some opiates/benzos to help him sleep.  Found by family to be hypoxic and altered with oxygen saturations reported 66% on room air.  Temperature up to 100.3, WBC count 5.5, procalcitonin elevated 12.07 with a lactic acid of 1.8.  Chest x-ray noted multilobar bronchopneumonia mostly accompanying the right middle/lower lobes with also some left lung base density consistent with aspiration vs viral pneumonia.  Initially started on broad-spectrum antibiotics with vancomycin, cefepime, Flagyl.  COVID-19 testing performed by EDP; although low risk per family, and has been self isolating and no known contacts.  UDS was positive for benzos.  Respiratory viral panel/MRSA PCR/rapid influenza negative.  Girlfriend also reported that she was missing roughly 10.5 tablets of her Xanax. °--Continue antibiotics with Unasyn 1.5 g IV every 6 hours °--COVID-19 pending °--Blood cultures x2: no growth x 2days °--Contact/droplet isolation precaution °--Aspiration precautions °--Titrate supplemental oxygen to maintain SPO2 greater than 92% °--6-minute walk test today °--Supportive care ° °Questionable unintentional overdose °Graft reports missing 10.5 tablets of her Xanax this morning.  Patient was known to have taken some benzos and opiates last night to help with sleep and was found to be altered and hypoxic. °--UDS positive for benzos °--Avoid any sedating medications °--Aspiration precautions °--Continue to monitor on telemetry ° °Dysphagia °History of    esophageal spasms/stricture/stenosis and dysmotility disorders with prior esophageal stenosis/stricture requiring dilatation history of recurrent aspiration with episodes of aspiration pneumonia at times requiring intubation and ventilation.  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 °--Continue Protonix BID °--may need speech pathologist evaluation vs further GI input, but will hold off on elective consults until COVID 19 test results ° °Right upper extremity edema °Etiology likely infiltrated IV.  There is some skin discrimination from dressing.  Area slightly erythematous.  Edema improving. °--Pending right upper extremity ultrasound duplex to evaluate for DVT, although low suspicion °--Continue supportive care, strongly elevation ° °Hx Hep C °ALT is 52, AST is 39 with alk phos of 67 and total bili of 0.5. Liver biopsy in 2014 revealed Grade 2 inflammation and stage 0 fibrosis. AFP was not elevated. --continue outpatient outpatient follow-up for possible treatment °  °Hx Chronic Pain/Depression/Anxiety and Polysubstance dependence  °Opiate and Benzo dependence. Will be cautious with opiates and benzos, patient currently dealing with ongoing grief regarding the recent passing away of his 20-year-old son and he was attending grief counseling as an outpatient prior to admission. °--no psych involvement at this time as being ruled out for SARS-COV-2. °  °Hypothyroidism °stable, last TSH was 0.67 °--continue levothyroxine 25 mcg daily °  °HTN °Blood pressure soft on admission °--hold Atenolol 25 mg for now until BP recovers  ° °DVT prophylaxis: Heparin °Code Status: Full code °Family Communication: None °Disposition Plan: Transfer to floor, continue droplet/contact isolation, continue on IV fluid hydration until mental status improves and can tolerate oral intake, further depending on clinical course but anticipate discharge home in 3-4  days. ° ° °Consultants:  °· none ° °Procedures:  °· none ° °Antimicrobials:  °· Vancomycin 3/22 -3/22 °· Cefepime 3/22 - 3/22 °· Metronidazole 3/22 - 3/22 °· Unasyn 3/22>>> ° ° °Subjective: ° °Patient seen and examined at bedside, continues to complain of right upper extremity pain/edema.  Appetite improving, denies any persistent concerns with aphasia overnight.  Asked when he can discharge home, although continues on supplemental oxygen at this time.  Discussed with him that we need to remove the oxygen given he has no underlying chronic lung disease issues.  No other complaints at this time.  Denies headache, no fever/chills/night sweats, no nausea/vomiting/diarrhea, no chest pain, no palpitations, no shortness of breath, no issues with bowel/bladder function, no cough/congestion.  No acute issues overnight per nursing staff. ° °Objective: °Vitals:  ° 11/07/18 1600 11/07/18 1637 11/07/18 2150 11/08/18 0513  °BP: 118/63 121/70 124/72 125/72  °Pulse: 63 72 82 73  °Resp: 14 15 18 16  °Temp:  98.3 °F (36.8 °C) 98.7 °F (37.1 °C) 98.7 °F (37.1 °C)  °TempSrc:  Oral Oral Oral  °SpO2: 98% 96% 99% 90%  °Weight:      °Height:      ° ° °Intake/Output Summary (Last 24 hours) at 11/08/2018 1324 °Last data filed at 11/08/2018 1024 °Gross per 24 hour  °Intake 670 ml  °Output 2350 ml  °Net -1680 ml  ° °Filed Weights  ° 11/06/18 1028  °Weight: 86.2 kg  ° ° °Examination: ° °General exam: Appears calm and comfortable  °Respiratory system: Coarse breath sounds bilaterally, respiratory effort normal.  On 2 L nasal cannula °Cardiovascular system: S1 & S2 heard, RRR. No JVD, murmurs, rubs, gallops or clicks. No pedal edema. °Gastrointestinal system: Abdomen is nondistended, soft and nontender. No organomegaly or masses felt. Normal bowel sounds heard. °Central nervous system: Alert and oriented. No focal neurological   deficits. Extremities: Symmetric 5 x 5 power. Skin: No rashes, lesions or ulcers Psychiatry: Judgement and insight  appear normal. Mood & affect appropriate.     Data Reviewed: I have personally reviewed following labs and imaging studies  CBC: Recent Labs  Lab 11/06/18 1100 11/08/18 0552  WBC 5.5 7.0  NEUTROABS 4.5  --   HGB 16.2 12.0*  HCT 50.2 36.7*  MCV 93.0 92.9  PLT 178 462*   Basic Metabolic Panel: Recent Labs  Lab 11/06/18 1150 11/08/18 0552  NA 137 138  K 4.2 3.6  CL 104 101  CO2 25 28  GLUCOSE 119* 103*  BUN 14 12  CREATININE 0.78 0.69  CALCIUM 8.3* 8.7*  MG  --  2.1   GFR: Estimated Creatinine Clearance: 113.2 mL/min (by C-G formula based on SCr of 0.69 mg/dL). Liver Function Tests: Recent Labs  Lab 11/06/18 1150  AST 39  ALT 52*  ALKPHOS 67  BILITOT 0.5  PROT 7.1  ALBUMIN 3.9   No results for input(s): LIPASE, AMYLASE in the last 168 hours. No results for input(s): AMMONIA in the last 168 hours. Coagulation Profile: Recent Labs  Lab 11/06/18 1150  INR 1.0   Cardiac Enzymes: Recent Labs  Lab 11/06/18 1150  TROPONINI <0.03   BNP (last 3 results) No results for input(s): PROBNP in the last 8760 hours. HbA1C: No results for input(s): HGBA1C in the last 72 hours. CBG: No results for input(s): GLUCAP in the last 168 hours. Lipid Profile: No results for input(s): CHOL, HDL, LDLCALC, TRIG, CHOLHDL, LDLDIRECT in the last 72 hours. Thyroid Function Tests: No results for input(s): TSH, T4TOTAL, FREET4, T3FREE, THYROIDAB in the last 72 hours. Anemia Panel: No results for input(s): VITAMINB12, FOLATE, FERRITIN, TIBC, IRON, RETICCTPCT in the last 72 hours. Sepsis Labs: Recent Labs  Lab 11/06/18 1100 11/06/18 1257 11/06/18 1637  PROCALCITON  --   --  12.07  LATICACIDVEN 1.8 1.4  --     Recent Results (from the past 240 hour(s))  Urine culture     Status: None   Collection Time: 11/06/18 10:42 AM  Result Value Ref Range Status   Specimen Description   Final    URINE, CLEAN CATCH Performed at [facility], [address] [place], [place]  [address]    Special Requests   Final    NONE Performed at [facility], [address] [place], [place] [address]    Culture   Final    NO GROWTH Performed at [facility], [place] [address], [place], [place] [address]    Report Status 11/08/2018 FINAL  Final  Respiratory Panel by PCR     Status: None   Collection Time: 11/06/18 11:03 AM  Result Value Ref Range Status   Adenovirus NOT DETECTED NOT DETECTED Final   Coronavirus 229E NOT DETECTED NOT DETECTED Final    Comment: (NOTE) The Coronavirus on the Respiratory Panel, DOES NOT test for the novel  Coronavirus (2019 nCoV)    Coronavirus HKU1 NOT DETECTED NOT DETECTED Final   Coronavirus NL63 NOT DETECTED NOT DETECTED Final   Coronavirus OC43 NOT DETECTED NOT DETECTED Final   Metapneumovirus NOT DETECTED NOT DETECTED Final   Rhinovirus / Enterovirus NOT DETECTED NOT DETECTED Final   Influenza A NOT DETECTED NOT DETECTED Final   Influenza B NOT DETECTED NOT DETECTED Final   Parainfluenza Virus 1 NOT DETECTED NOT DETECTED Final   Parainfluenza Virus 2 NOT DETECTED NOT DETECTED Final   Parainfluenza Virus 3 NOT  DETECTED NOT DETECTED Final  ° Parainfluenza Virus 4 NOT DETECTED NOT DETECTED Final  ° Respiratory Syncytial Virus NOT DETECTED NOT DETECTED Final  ° Bordetella pertussis NOT DETECTED NOT DETECTED Final  ° Chlamydophila pneumoniae NOT DETECTED NOT DETECTED Final  ° Mycoplasma pneumoniae NOT DETECTED NOT DETECTED Final  °  Comment: Performed at [facility], [address] [place], [place] [address]  °Blood Culture (routine x 2)     Status: None (Preliminary result)  ° Collection Time: 11/06/18 11:06 AM  °Result Value Ref Range Status  ° Specimen Description   Final  °  RIGHT ANTECUBITAL BOTTLES DRAWN AEROBIC AND ANAEROBIC  ° Special Requests   Final  °  Blood Culture results may not be optimal due to an inadequate volume of blood received in culture bottles  ° Culture   Final  °  NO GROWTH 2 DAYS °Performed at [facility], [address] [place], [place] [address] °  ° Report Status PENDING  Incomplete  °Blood Culture (routine x 2)     Status: None (Preliminary result)  ° Collection Time: 11/06/18 11:06 AM  °Result Value Ref Range Status  ° Specimen Description   Final  °  LEFT ANTECUBITAL BOTTLES DRAWN AEROBIC AND ANAEROBIC  ° Special Requests Blood Culture adequate volume  Final  ° Culture   Final  °  NO GROWTH 2 DAYS °Performed at [facility], [address] [place], [place] [address] °  ° Report Status PENDING  Incomplete  °Novel Coronavirus, NAA (hospital order; send-out to ref lab)     Status: None (Preliminary result)  ° Collection Time: 11/06/18 11:22 AM  °Result Value Ref Range Status  ° SARS-CoV-2, NAA PENDING NOT DETECTED Incomplete  °MRSA PCR Screening     Status: None  ° Collection Time: 11/06/18  7:02 PM  °Result Value Ref Range Status  ° MRSA by PCR NEGATIVE NEGATIVE Final  °  Comment:        °The GeneXpert MRSA Assay (FDA °approved for NASAL specimens °only), is one component of a °comprehensive MRSA colonization °surveillance program. It is not °intended to diagnose MRSA °infection nor to guide or °monitor treatment for °MRSA infections. °Performed at [facility], [address] [place], [place] [address] °  °  ° ° ° ° ° °Radiology Studies: °No results found. ° ° ° ° ° °Scheduled Meds: °• atenolol  25 mg Oral Daily  °• guaiFENesin  600 mg Oral BID  °• heparin  5,000 Units Subcutaneous Q8H  °• levothyroxine  25 mcg Oral Daily  °• mouth rinse  15 mL Mouth Rinse BID  °• multivitamin with minerals  1 tablet Oral Daily  °• pantoprazole  40 mg Oral BID  °• sodium chloride flush  3 mL Intravenous Q12H  ° °Continuous Infusions: °• sodium chloride    °• ampicillin-sulbactam (UNASYN) IV 1.5 g (11/08/18 1055)  ° ° ° LOS: 2 days  ° ° °Time spent: 29 minutes ° ° ° °Eric J Austria, DO °[facility] °Pager [phone] ° °If 7PM-7AM, please contact night-coverage [email or web address] °Password  [email or web address] °11/08/2018, 1:24 PM  ° °

## 2018-11-09 ENCOUNTER — Encounter (HOSPITAL_COMMUNITY): Payer: Medicaid Other

## 2018-11-09 DIAGNOSIS — J180 Bronchopneumonia, unspecified organism: Secondary | ICD-10-CM

## 2018-11-09 LAB — BASIC METABOLIC PANEL
Anion gap: 10 (ref 5–15)
BUN: 15 mg/dL (ref 6–20)
CO2: 27 mmol/L (ref 22–32)
Calcium: 8.8 mg/dL — ABNORMAL LOW (ref 8.9–10.3)
Chloride: 104 mmol/L (ref 98–111)
Creatinine, Ser: 0.69 mg/dL (ref 0.61–1.24)
GFR calc non Af Amer: >60 mL/min (ref >60)
Glucose, Bld: 104 mg/dL — ABNORMAL HIGH (ref 70–99)
Potassium: 3.5 mmol/L (ref 3.5–5.1)
Sodium: 141 mmol/L (ref 135–145)

## 2018-11-09 LAB — CBC
HCT: 38.1 % — ABNORMAL LOW (ref 39.0–52.0)
Hemoglobin: 12.3 g/dL — ABNORMAL LOW (ref 13.0–17.0)
MCH: 29.8 pg (ref 26.0–34.0)
MCHC: 32.3 g/dL (ref 30.0–36.0)
MCV: 92.3 fL (ref 80.0–100.0)
Platelets: 175 10*3/uL (ref 150–400)
RBC: 4.13 MIL/uL — ABNORMAL LOW (ref 4.22–5.81)
RDW: 12.7 % (ref 11.5–15.5)
WBC: 6.9 10*3/uL (ref 4.0–10.5)
nRBC: 0 % (ref 0.0–0.2)

## 2018-11-09 LAB — MAGNESIUM: Magnesium: 2.1 mg/dL (ref 1.7–2.4)

## 2018-11-09 MED ORDER — AMOXICILLIN-POT CLAVULANATE 875-125 MG PO TABS: 1.0000 | ORAL_TABLET | Freq: Two times a day (BID) | ORAL | 0 refills | Status: AC

## 2018-11-09 NOTE — Discharge Summary (Signed)
Physician Discharge Summary  Christian Ellison YIR:[id number] DOB: Oct 27, 1960 DOA: 11/06/2018  PCP: [facility] [place] date: 11/06/2018 Discharge date: 11/09/2018  Admitted From home Disposition: home Recommendations for Outpatient Follow-up:  1. Follow up with PCP in 1-2 weeks 2. Please obtain BMP/CBC in one week 3. Please follow up on covid results  Home Healthno Equipment/Devicesnone Discharge Condition stable CODE STATUS:full Diet recommendation:cardiac  Brief/Interim Summary:58 y.o.malewithpast medical history relevant for history of hep C, h/o esophageal spasms and strictures/stenosis with history of recurrent aspiration pneumonia in the past requiring intubation and ventilation--as well as history of chronic pain and anxiety with opiate and benzo dependence presented by EMS with altered mentation and hypoxia.According to patient's girlfriend pulse ox read 57% on RA, EMS got 72% on RA. No O2 use at homeat baseline.Patient presented with cough, shortness of breath, hypoxia and temp was 100.3, pt had vomited in bed this morning concerning for possible aspiration event.  While eating dinner yesterday patient had some trouble swallowing, he had taking some Xanax/benzos prior to going to bed apparently went into a deep sleep and had emesis while asleep overnight. No diarrhea reported.  In ED, Patient tachycardic up to the 120s, tachypneic with respiratory rate in the mid 20s, soft blood pressures with systolic blood pressure in the mid 90s. Llactic acid is not elevated, no leukocytosis; troponin was negative.  Pt was given fluid boluses; started on antibiotics including Flagyl, Vanco and cefepime to cover for possible aspiration pneumonia,however chest x-ray read by radiologist as possible viral pneumonia given negative influenza test. EDP initiatedCOVID -19 testingprocedures. Also patient does have mild lymphopenia.   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  Apparently he had issues with oral intake last night and went to bed and took some opiates/benzos to help him sleep.  Found by family to be hypoxic and altered with oxygen saturations reported 66% on room air.  Temperature up to 100.3, WBC count 5.5, procalcitonin elevated 12.07 with a lactic acid of 1.8.  Chest x-ray noted multilobar bronchopneumonia mostly accompanying the right middle/lower lobes with also some left lung base density consistent with aspiration vs viral pneumonia.  Initially started on broad-spectrum antibiotics with vancomycin, cefepime, Flagyl.then changed to unasyn.  COVID-19 testing performed by EDP; although low risk per family, and has been self isolating and no known contacts.  UDS was positive for benzos.  Respiratory viral panel/MRSA PCR/rapid influenza negative.  Girlfriend also reported that she was missing roughly 10.5 tablets of her Xanax. --COVID-19 pending --Blood cultures x2: no growth x 2days -patient on room air on day of dc  Questionable unintentional overdose Girl friend  reports missing 10.5 tablets of her Xanax this morning.  Patient was known to have taken some benzos and opiates last night to help with sleep and was found  to be altered and hypoxic. --UDS positive for benzos  Dysphagia History of esophageal spasms/stricture/stenosis  and dysmotility disorderswith prior esophageal stenosis/stricturerequiring dilatation history of recurrent aspiration with episodes of aspiration pneumonia at times requiring intubation and ventilation.PreviousEsophageal Manometry:Revealed that patient has incomplete relaxation of LES/E GJ outflow obstruction. --Continue Protonix   Right upper extremity edema Etiology likely infiltrated IV.  There is some skin discrimination from dressing.  Area slightly erythematous.  Edema improving.   ELEVATED LFTS  ALT is 52, AST is 39 with alk phos of 67 and total bili of 0.5. Liver biopsy in 2014 revealed Grade 2 inflammation and stage 0 fibrosis. AFP was not elevated. --continue outpatient outpatient follow-up for possible treatment  HxChronicPain/Depression/Anxiety andPolysubstance dependence   Hypothyroidism stable, last TSH was 0.67 --continue levothyroxine 25 mcg daily  HTN CONTINUE ATENOLOL  Estimated body mass index is 25.77 kg/m as calculated from the following:   Height as of this encounter: 6' (1.829 m).   Weight as of this encounter: 86.2 kg.  Discharge Instructions  Discharge Instructions    Call MD for:  difficulty breathing, headache or visual disturbances   Complete by:  As directed    Call MD for:  temperature >100.4   Complete by:  As directed    Diet - low sodium heart healthy   Complete by:  As directed    Increase activity slowly   Complete by:  As directed      Allergies as of 11/09/2018   No Known Allergies     Medication List    TAKE these medications   albuterol 108 (90 Base) MCG/ACT inhaler Commonly known as:  PROVENTIL HFA;VENTOLIN HFA Inhale 2 puffs into the lungs every 6 (six) hours as needed for wheezing or shortness of breath.   amoxicillin-clavulanate 875-125 MG tablet Commonly known as:  Augmentin Take 1 tablet by mouth  2 (two) times daily for 4 days.   atenolol 25 MG tablet Commonly known as:  TENORMIN Take 25 mg by mouth daily.   levothyroxine 25 MCG tablet Commonly known as:  SYNTHROID, LEVOTHROID Take 25 mcg by mouth daily.   multivitamin with minerals Tabs tablet Take 1 tablet by mouth daily. 50+   pantoprazole 40 MG tablet Commonly known as:  PROTONIX Take 1 tablet (40 mg total) by mouth 2 (two) times daily.      Follow-up Information    [facility].   Contact information: [address] [place] [address] [phone]        Koneswaran, Suresh A, MD .   Specialty:  Cardiology Contact information: [address] [place] [address] [phone]          No Known Allergies  Consultations: NONE  Procedures/Studies: Dg Chest Port 1 View  Result Date: 11/06/2018 CLINICAL DATA:  57-year-old male with history of fever. History of esophageal spasms in strictures. Prior history of aspiration pneumonia. EXAM: PORTABLE CHEST 1 VIEW COMPARISON:  Chest x-ray 08/22/2018. FINDINGS: New ill-defined airspace consolidation in the right lung base without obscuration of either the right heart border or right hemidiaphragm (potentially within either the right middle or right lower lobe) which has hazy imaging characteristics. Additional less pronounced haziness is also noted in the region of the right upper lobe, and to a lesser extent in the left base. Relative sparing of the left upper lobe. No pleural effusions. No evidence of pulmonary edema. Heart size and mediastinal contours are within normal limits. IMPRESSION: 1. Findings are concerning for multilobar bronchopneumonia, most severe in either the right middle or lower lobe, but with   additional involvement of the right upper lobe, and some patchy ill-defined opacities at the left lung base is well. This pattern is not classic for aspiration pneumonia, and could be reflective of a variety of infectious  agents, including viral pneumonia. These results were called by telephone at the time of interpretation on 11/06/2018 at 11:04 am to Dr. Francine Graven, who verbally acknowledged these results. Electronically Signed   By: Vinnie Langton M.D.   On: 11/06/2018 11:04   US Abdomen Complete W/elastography  Result Date: 10/24/2018 CLINICAL DATA:  Chronic hepatitis C without hepatic coma. EXAM: ULTRASOUND ABDOMEN ULTRASOUND HEPATIC ELASTOGRAPHY TECHNIQUE: Sonography of the upper abdomen was performed. In addition, ultrasound elastography evaluation of the liver was performed. A region of interest was placed within the right lobe of the liver. Following application of a compressive sonographic pulse, shear waves were detected in the adjacent hepatic tissue and the shear wave velocity was calculated. Multiple assessments were performed at the selected site. Median shear wave velocity is correlated to a Metavir fibrosis score. COMPARISON:  None. FINDINGS: ULTRASOUND ABDOMEN Gallbladder: No gallstones or wall thickening visualized. No sonographic Murphy sign noted by sonographer. Common bile duct: Diameter: 3 mm, within normal limits. Liver: No focal lesion identified. Within normal limits in parenchymal echogenicity. Portal vein is patent on color Doppler imaging with normal direction of blood flow towards the liver. IVC: No abnormality visualized. Pancreas: Visualized portion unremarkable. Spleen: Enlarged, with length of approximately 14 cm and calculated volume of 980 mL. No splenic mass identified. Right Kidney: Length: 10.6 cm. Echogenicity within normal limits. No mass or hydronephrosis visualized. Left Kidney: Length: 11.4 cm. Echogenicity within normal limits. No mass or hydronephrosis visualized. Abdominal aorta: No aneurysm visualized. Other findings: None. ULTRASOUND HEPATIC ELASTOGRAPHY Device: Siemens Helix VTQ Patient position: Left Lateral Decubitus Transducer 6C1 Number of measurements: 10 Hepatic  segment:  8 Median velocity:   1.46 m/sec IQR: 0.13 IQR/Median velocity ratio: 0.09 Corresponding Metavir fibrosis score:  F2 + some F3 Risk of fibrosis: Moderate Limitations of exam: Some motion during respiration Please note that abnormal shear wave velocities may also be identified in clinical settings other than with hepatic fibrosis, such as: acute hepatitis, elevated right heart and central venous pressures including use of beta blockers, veno-occlusive disease (Budd-Chiari), infiltrative processes such as mastocytosis/amyloidosis/infiltrative tumor, extrahepatic cholestasis, in the post-prandial state, and liver transplantation. Correlation with patient history, laboratory data, and clinical condition recommended. IMPRESSION: ULTRASOUND ABDOMEN: No evidence of hepatobiliary disease. Mild splenomegaly. ULTRASOUND HEPATIC ELASTOGRAPHY: Median hepatic shear wave velocity is calculated at 1.46 m/sec. Corresponding Metavir fibrosis score is F2 + some F3. Risk of fibrosis is Moderate. Follow-up: Additional testing appropriate Electronically Signed   By: Earle Gell M.D.   On: 10/24/2018 13:14    (Echo, Carotid, EGD, Colonoscopy, ERCP)    Subjective:   Discharge Exam: Vitals:   11/08/18 2041 11/09/18 0530  BP: 116/82 135/81  Pulse: 66 67  Resp: 16 14  Temp: 98.2 F (36.8 C) 97.7 F (36.5 C)  SpO2: 91% (!) 88%   Vitals:   11/08/18 0513 11/08/18 1420 11/08/18 2041 11/09/18 0530  BP: 125/72 121/78 116/82 135/81  Pulse: 73 69 66 67  Resp: _0 Temp: 98.7 F (37.1 C) 98 F (36.7 C) 98.2 F (36.8 C) 97.7 F (36.5 C)  TempSrc: Oral Oral Oral Oral  SpO2: 90% 91% 91% (!) 88%  Weight:      Height:        General: Pt is  alert, awake, not in acute distress Cardiovascular: RRR, S1/S2 +, no rubs, no gallops Respiratory: CTA bilaterally, no wheezing, no rhonchi Abdominal: Soft, NT, ND, bowel sounds + Extremities: no edema, no cyanosis    The results of significant diagnostics  from this hospitalization (including imaging, microbiology, ancillary and laboratory) are listed below for reference.     Microbiology: Recent Results (from the past 240 hour(s))  Urine culture     Status: None   Collection Time: 11/06/18 10:42 AM  Result Value Ref Range Status   Specimen Description   Final    URINE, CLEAN CATCH Performed at [facility], [address] [place], [place] [address]    Special Requests   Final    NONE Performed at [facility], [address] [place], [place] [address]    Culture   Final    NO GROWTH Performed at [facility], [address] [place], [place] [address]    Report Status 11/08/2018 FINAL  Final  Respiratory Panel by PCR     Status: None   Collection Time: 11/06/18 11:03 AM  Result Value Ref Range Status   Adenovirus NOT DETECTED NOT DETECTED Final   Coronavirus 229E NOT DETECTED NOT DETECTED Final    Comment: (NOTE) The Coronavirus on the Respiratory Panel, DOES NOT test for the novel  Coronavirus (2019 nCoV)    Coronavirus HKU1 NOT DETECTED NOT DETECTED Final   Coronavirus NL63 NOT DETECTED NOT DETECTED Final   Coronavirus OC43 NOT DETECTED NOT DETECTED Final   Metapneumovirus NOT DETECTED NOT DETECTED Final   Rhinovirus / Enterovirus NOT DETECTED NOT DETECTED Final   Influenza A NOT DETECTED NOT DETECTED Final   Influenza B NOT DETECTED NOT DETECTED Final   Parainfluenza Virus 1 NOT DETECTED NOT DETECTED Final   Parainfluenza Virus 2 NOT DETECTED NOT DETECTED Final   Parainfluenza Virus 3 NOT DETECTED NOT DETECTED Final   Parainfluenza Virus 4 NOT DETECTED NOT DETECTED Final   Respiratory Syncytial Virus NOT DETECTED NOT DETECTED Final   Bordetella pertussis NOT DETECTED NOT DETECTED Final   Chlamydophila pneumoniae NOT DETECTED NOT DETECTED Final   Mycoplasma pneumoniae NOT DETECTED NOT DETECTED Final    Comment: Performed at [facility], [address] [place], [place] [address]  Blood Culture (routine x 2)      Status: None (Preliminary result)   Collection Time: 11/06/18 11:06 AM  Result Value Ref Range Status   Specimen Description   Final    RIGHT ANTECUBITAL BOTTLES DRAWN AEROBIC AND ANAEROBIC   Special Requests   Final    Blood Culture results may not be optimal due to an inadequate volume of blood received in culture bottles   Culture   Final    NO GROWTH 3 DAYS Performed at [facility], [address] [place], [place] [address]    Report Status PENDING  Incomplete  Blood Culture (routine x 2)     Status: None (Preliminary result)   Collection Time: 11/06/18 11:06 AM  Result Value Ref Range Status   Specimen Description   Final    LEFT ANTECUBITAL BOTTLES DRAWN AEROBIC AND ANAEROBIC   Special Requests Blood Culture adequate volume  Final   Culture   Final    NO GROWTH 3 DAYS Performed at [facility], [address] , [place] [address]    Report Status PENDING  Incomplete  Novel Coronavirus, NAA (hospital order; send-out to ref lab)     Status: None (Preliminary result)     Collection Time: 11/06/18 11:22 AM  Result Value Ref Range Status   SARS-CoV-2, NAA PENDING NOT DETECTED Incomplete  MRSA PCR Screening     Status: None   Collection Time: 11/06/18  7:02 PM  Result Value Ref Range Status   MRSA by PCR NEGATIVE NEGATIVE Final    Comment:        The GeneXpert MRSA Assay (FDA approved for NASAL specimens only), is one component of a comprehensive MRSA colonization surveillance program. It is not intended to diagnose MRSA infection nor to guide or monitor treatment for MRSA infections. Performed at [facility], [address] [place], [place] [address]      Labs: BNP (last 3 results) Recent Labs    01/02/18 0209 02/02/18 1355 02/03/18 0507  BNP 310.8* 238.0* 92.0   Basic Metabolic Panel: Recent Labs  Lab 11/06/18 1150 11/08/18 0552 11/09/18 0431  NA 137 138 141  K 4.2 3.6 3.5  CL 104 101 104  CO2 25 28 27  GLUCOSE 119* 103*  104*  BUN 14 12 15  CREATININE 0.78 0.69 0.69  CALCIUM 8.3* 8.7* 8.8*  MG  --  2.1 2.1   Liver Function Tests: Recent Labs  Lab 11/06/18 1150  AST 39  ALT 52*  ALKPHOS 67  BILITOT 0.5  PROT 7.1  ALBUMIN 3.9   No results for input(s): LIPASE, AMYLASE in the last 168 hours. No results for input(s): AMMONIA in the last 168 hours. CBC: Recent Labs  Lab 11/06/18 1100 11/08/18 0552 11/09/18 0431  WBC 5.5 7.0 6.9  NEUTROABS 4.5  --   --   HGB 16.2 12.0* 12.3*  HCT 50.2 36.7* 38.1*  MCV 93.0 92.9 92.3  PLT 178 146* 175   Cardiac Enzymes: Recent Labs  Lab 11/06/18 1150  TROPONINI <0.03   BNP: Invalid input(s): POCBNP CBG: No results for input(s): GLUCAP in the last 168 hours. D-Dimer No results for input(s): DDIMER in the last 72 hours. Hgb A1c No results for input(s): HGBA1C in the last 72 hours. Lipid Profile No results for input(s): CHOL, HDL, LDLCALC, TRIG, CHOLHDL, LDLDIRECT in the last 72 hours. Thyroid function studies No results for input(s): TSH, T4TOTAL, T3FREE, THYROIDAB in the last 72 hours.  Invalid input(s): FREET3 Anemia work up No results for input(s): VITAMINB12, FOLATE, FERRITIN, TIBC, IRON, RETICCTPCT in the last 72 hours. Urinalysis    Component Value Date/Time   COLORURINE YELLOW 11/06/2018 1042   APPEARANCEUR CLEAR 11/06/2018 1042   LABSPEC 1.011 11/06/2018 1042   PHURINE 6.0 11/06/2018 1042   GLUCOSEU NEGATIVE 11/06/2018 1042   HGBUR NEGATIVE 11/06/2018 1042   BILIRUBINUR NEGATIVE 11/06/2018 1042   KETONESUR NEGATIVE 11/06/2018 1042   PROTEINUR NEGATIVE 11/06/2018 1042   NITRITE NEGATIVE 11/06/2018 1042   LEUKOCYTESUR NEGATIVE 11/06/2018 1042   Sepsis Labs Invalid input(s): PROCALCITONIN,  WBC,  LACTICIDVEN Microbiology Recent Results (from the past 240 hour(s))  Urine culture     Status: None   Collection Time: 11/06/18 10:42 AM  Result Value Ref Range Status   Specimen Description   Final    URINE, CLEAN CATCH Performed at  [facility], [address] [place], [place] [address]    Special Requests   Final    NONE Performed at [facility], [address] [place], [place] [address]    Culture   Final    NO GROWTH Performed at [facility], [address] [place], [place] [address]    Report Status 11/08/2018 FINAL    Final  Respiratory Panel by PCR     Status: None   Collection Time: 11/06/18 11:03 AM  Result Value Ref Range Status   Adenovirus NOT DETECTED NOT DETECTED Final   Coronavirus 229E NOT DETECTED NOT DETECTED Final    Comment: (NOTE) The Coronavirus on the Respiratory Panel, DOES NOT test for the novel  Coronavirus (2019 nCoV)    Coronavirus HKU1 NOT DETECTED NOT DETECTED Final   Coronavirus NL63 NOT DETECTED NOT DETECTED Final   Coronavirus OC43 NOT DETECTED NOT DETECTED Final   Metapneumovirus NOT DETECTED NOT DETECTED Final   Rhinovirus / Enterovirus NOT DETECTED NOT DETECTED Final   Influenza A NOT DETECTED NOT DETECTED Final   Influenza B NOT DETECTED NOT DETECTED Final   Parainfluenza Virus 1 NOT DETECTED NOT DETECTED Final   Parainfluenza Virus 2 NOT DETECTED NOT DETECTED Final   Parainfluenza Virus 3 NOT DETECTED NOT DETECTED Final   Parainfluenza Virus 4 NOT DETECTED NOT DETECTED Final   Respiratory Syncytial Virus NOT DETECTED NOT DETECTED Final   Bordetella pertussis NOT DETECTED NOT DETECTED Final   Chlamydophila pneumoniae NOT DETECTED NOT DETECTED Final   Mycoplasma pneumoniae NOT DETECTED NOT DETECTED Final    Comment: Performed at [facility], [address] [place], [place] [address]  Blood Culture (routine x 2)     Status: None (Preliminary result)   Collection Time: 11/06/18 11:06 AM  Result Value Ref Range Status   Specimen Description   Final    RIGHT ANTECUBITAL BOTTLES DRAWN AEROBIC AND ANAEROBIC   Special Requests   Final    Blood Culture results may not be optimal due to an inadequate volume of blood received in culture bottles   Culture   Final     NO GROWTH 3 DAYS Performed at [facility], [address] [place], [place] [address]    Report Status PENDING  Incomplete  Blood Culture (routine x 2)     Status: None (Preliminary result)   Collection Time: 11/06/18 11:06 AM  Result Value Ref Range Status   Specimen Description   Final    LEFT ANTECUBITAL BOTTLES DRAWN AEROBIC AND ANAEROBIC   Special Requests Blood Culture adequate volume  Final   Culture   Final    NO GROWTH 3 DAYS Performed at [facility], [address] [place], [place] [address]    Report Status PENDING  Incomplete  Novel Coronavirus, NAA (hospital order; send-out to ref lab)     Status: None (Preliminary result)   Collection Time: 11/06/18 11:22 AM  Result Value Ref Range Status   SARS-CoV-2, NAA PENDING NOT DETECTED Incomplete  MRSA PCR Screening     Status: None   Collection Time: 11/06/18  7:02 PM  Result Value Ref Range Status   MRSA by PCR NEGATIVE NEGATIVE Final    Comment:        The GeneXpert MRSA Assay (FDA approved for NASAL specimens only), is one component of a comprehensive MRSA colonization surveillance program. It is not intended to diagnose MRSA infection nor to guide or monitor treatment for MRSA infections. Performed at [facility], [address] [place],  [address]      Time coordinating discharge:  33 minutes  SIGNED:   Elizabeth G Mathews, MD  [facility] 11/09/2018, 12:39 PM Pager   If 7PM-7AM, please contact night-coverage [email or web address] Password [email or web address]   

## 2018-11-09 NOTE — Progress Notes (Signed)
Pharmacy Antibiotic Note  Christian Ellison is a 58 y.o. male admitted on 11/06/2018 with pneumonia.  Pharmacy has been consulted for Unasyn dosing. 11/09/2018  D#4 Unasyn for aspiration PNA. AF, WBC WNL, no positive culture data so far  Plan: Continue Unasyn 1.5 gm IV q6 Consider changing to Augmentin 875 mg PO bid  Height: 6' (182.9 cm) Weight: 190 lb (86.2 kg) IBW/kg (Calculated) : 77.6  Temp (24hrs), Avg:98 F (36.7 C), Min:97.7 F (36.5 C), Max:98.2 F (36.8 C)  Recent Labs  Lab 11/06/18 1100 11/06/18 1150 11/06/18 1257 11/08/18 0552 11/09/18 0431  WBC 5.5  --   --  7.0 6.9  CREATININE  --  0.78  --  0.69 0.69  LATICACIDVEN 1.8  --  1.4  --   --     Estimated Creatinine Clearance: 113.2 mL/min (by C-G formula based on SCr of 0.69 mg/dL).    No Known Allergies Antimicrobials this admission: Flagyl 3/22 x 1 Vancomycin 3/22 >>  Cefepime 3/22 >> Unasyn 3/22 >>  Dose adjustments this admission:   Microbiology results:  3/22 BCx: ngtd 3/22 Respiratory panel: neg 3/22 UCx: ngf 3/22 Influenza A/B: neg/neg 3/22 MRSA PCR: neg 3/22 COVID-19:    Thank you for allowing pharmacy to be a part of this patient's care.  Herby Abraham, Pharm.D 11/09/2018 8:40 AM

## 2018-11-11 LAB — CULTURE, BLOOD (ROUTINE X 2)
Culture: NO GROWTH
Culture: NO GROWTH
Special Requests: ADEQUATE

## 2018-11-17 LAB — NOVEL CORONAVIRUS, NAA (HOSP ORDER, SEND-OUT TO REF LAB; TAT 18-24 HRS): SARS-CoV-2, NAA: NOT DETECTED

## 2018-12-20 ENCOUNTER — Ambulatory Visit (INDEPENDENT_AMBULATORY_CARE_PROVIDER_SITE_OTHER): Payer: Medicaid Other | Admitting: Internal Medicine

## 2018-12-20 ENCOUNTER — Encounter (INDEPENDENT_AMBULATORY_CARE_PROVIDER_SITE_OTHER): Payer: Self-pay | Admitting: Internal Medicine

## 2018-12-20 ENCOUNTER — Other Ambulatory Visit: Payer: Self-pay

## 2018-12-20 VITALS — BP 166/102 | HR 82 | Temp 97.0°F | Ht 72.0 in | Wt 190.0 lb

## 2018-12-20 DIAGNOSIS — B182 Chronic viral hepatitis C: Secondary | ICD-10-CM

## 2018-12-20 NOTE — Patient Instructions (Signed)
OV in 8 weeks.  

## 2018-12-20 NOTE — Progress Notes (Signed)
   Subjective:    Patient ID: Christian Ellison, male    DOB: [birth year], 58 y.o.   MRN: [id number]  HPI Here today for f/u. He will be treated for his Hepatitis C. Will be treated with Harvoni x 8 weeks. He denies any problems. His appetite is good. No weight loss.  BMs are normal. Hx of severe respiratory problems.  He is genotype 1A. US abdomen elast F2 and F3.     Review of Systems Past Medical History:  Diagnosis Date  . Anxiety   . Aspiration pneumonia (HCC) 05/26/2018   RLL  . Chronic pain   . GERD (gastroesophageal reflux disease)   . 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   Marland Kitchen Opiate abuse, continuous (HCC)     Past Surgical History:  Procedure Laterality Date  . BIOPSY  03/18/2018   Procedure: BIOPSY;  Surgeon: Malissa Hippo, MD;  Location: [facility];  Service: Endoscopy;;  gastric   . COLONOSCOPY N/A 11/22/2013   Dr. Karilyn Cota: Normal except for hemorrhoids.  Next colonoscopy 10 years.  . ESOPHAGEAL DILATION N/A 03/18/2018   Procedure: ESOPHAGEAL DILATION;  Surgeon: Malissa Hippo, MD;  Location: [facility];  Service: Endoscopy;  Laterality: N/A;  . ESOPHAGEAL MANOMETRY N/A 06/15/2018   Procedure: ESOPHAGEAL MANOMETRY (EM);  Surgeon: Napoleon Form, MD;  Location: [facility];  Service: Endoscopy;  Laterality: N/A;  . ESOPHAGOGASTRODUODENOSCOPY (EGD) WITH PROPOFOL N/A 03/18/2018   Dr. Karilyn Cota: Benign-appearing mild esophageal stenosis proximal to the GE J, status post dilation.  2 cm hiatal hernia.  Gastritis, biopsies negative for H. pylori  . Fracture Right Leg     Patient has rod/screws in this leg  . Rotor Cuff  2012   Right Shoulder    No Known Allergies  Current Outpatient Medications on File Prior to Visit  Medication Sig Dispense Refill  . albuterol (PROVENTIL HFA;VENTOLIN HFA) 108 (90 Base) MCG/ACT inhaler Inhale 2 puffs into the lungs every 6 (six) hours as needed for wheezing or shortness of breath. 1 Inhaler 0  . aspirin EC 81 MG  tablet Take 81 mg by mouth daily.    Marland Kitchen atenolol (TENORMIN) 25 MG tablet Take 25 mg by mouth daily.    Marland Kitchen levothyroxine (SYNTHROID, LEVOTHROID) 25 MCG tablet Take 25 mcg by mouth daily.  11  . Multiple Vitamin (MULTIVITAMIN WITH MINERALS) TABS tablet Take 1 tablet by mouth daily. 50+    . pantoprazole (PROTONIX) 40 MG tablet Take 40 mg by mouth 2 (two) times daily.    . pantoprazole (PROTONIX) 40 MG tablet Take 1 tablet (40 mg total) by mouth 2 (two) times daily. 30 tablet 0   No current facility-administered medications on file prior to visit.         Objective:   Physical Exam Blood pressure (!) 166/102, pulse 82, temperature (!) 97 F (36.1 C), height 6' (1.829 m), weight 190 lb (86.2 kg). Alert and oriented. Skin warm and dry. Oral mucosa is moist.   . Sclera anicteric, conjunctivae is pink. Thyroid not enlarged. No cervical lymphadenopathy. Lungs clear. Heart regular rate and rhythm.  Abdomen is soft. Bowel sounds are positive. No hepatomegaly. No abdominal masses felt. No tenderness.  No edema to lower extremities.          Assessment & Plan:  Hepatitis C. He is doing well. Will treat with Harvoni x 8 weeks.  Will see back in 8 weeks.

## 2018-12-21 ENCOUNTER — Other Ambulatory Visit (INDEPENDENT_AMBULATORY_CARE_PROVIDER_SITE_OTHER): Payer: Self-pay | Admitting: *Deleted

## 2018-12-21 DIAGNOSIS — B1920 Unspecified viral hepatitis C without hepatic coma: Secondary | ICD-10-CM

## 2018-12-28 ENCOUNTER — Telehealth (INDEPENDENT_AMBULATORY_CARE_PROVIDER_SITE_OTHER): Payer: Self-pay | Admitting: Internal Medicine

## 2018-12-29 NOTE — Telephone Encounter (Signed)
err

## 2019-02-06 ENCOUNTER — Other Ambulatory Visit (INDEPENDENT_AMBULATORY_CARE_PROVIDER_SITE_OTHER): Payer: Self-pay | Admitting: *Deleted

## 2019-02-06 ENCOUNTER — Encounter (INDEPENDENT_AMBULATORY_CARE_PROVIDER_SITE_OTHER): Payer: Self-pay | Admitting: *Deleted

## 2019-02-06 DIAGNOSIS — B1920 Unspecified viral hepatitis C without hepatic coma: Secondary | ICD-10-CM

## 2019-02-13 ENCOUNTER — Other Ambulatory Visit: Payer: Self-pay

## 2019-02-13 ENCOUNTER — Encounter (HOSPITAL_COMMUNITY): Payer: Self-pay | Admitting: *Deleted

## 2019-02-13 ENCOUNTER — Emergency Department (HOSPITAL_COMMUNITY): Payer: Self-pay

## 2019-02-13 ENCOUNTER — Encounter (HOSPITAL_COMMUNITY): Payer: Self-pay | Admitting: Emergency Medicine

## 2019-02-13 ENCOUNTER — Emergency Department (HOSPITAL_COMMUNITY)
Admission: EM | Admit: 2019-02-13 | Discharge: 2019-02-13 | Disposition: A | Discharge status: Home / Self Care | Payer: Self-pay | Attending: Emergency Medicine | Admitting: Emergency Medicine

## 2019-02-13 ENCOUNTER — Inpatient Hospital Stay (HOSPITAL_COMMUNITY)
Admission: EM | Admit: 2019-02-13 | Discharge: 2019-02-15 | DRG: 644 | Disposition: A | Discharge status: Home / Self Care | Payer: Self-pay | Attending: Internal Medicine | Admitting: Internal Medicine

## 2019-02-13 DIAGNOSIS — I10 Essential (primary) hypertension: Secondary | ICD-10-CM | POA: Diagnosis present

## 2019-02-13 DIAGNOSIS — K224 Dyskinesia of esophagus: Secondary | ICD-10-CM | POA: Diagnosis present

## 2019-02-13 DIAGNOSIS — I959 Hypotension, unspecified: Secondary | ICD-10-CM | POA: Diagnosis present

## 2019-02-13 DIAGNOSIS — Z20828 Contact with and (suspected) exposure to other viral communicable diseases: Secondary | ICD-10-CM | POA: Insufficient documentation

## 2019-02-13 DIAGNOSIS — F419 Anxiety disorder, unspecified: Secondary | ICD-10-CM | POA: Diagnosis present

## 2019-02-13 DIAGNOSIS — Z800 Family history of malignant neoplasm of digestive organs: Secondary | ICD-10-CM

## 2019-02-13 DIAGNOSIS — K219 Gastro-esophageal reflux disease without esophagitis: Secondary | ICD-10-CM | POA: Diagnosis present

## 2019-02-13 DIAGNOSIS — A419 Sepsis, unspecified organism: Secondary | ICD-10-CM

## 2019-02-13 DIAGNOSIS — E271 Primary adrenocortical insufficiency: Principal | ICD-10-CM | POA: Diagnosis present

## 2019-02-13 DIAGNOSIS — J690 Pneumonitis due to inhalation of food and vomit: Secondary | ICD-10-CM | POA: Insufficient documentation

## 2019-02-13 DIAGNOSIS — Z803 Family history of malignant neoplasm of breast: Secondary | ICD-10-CM

## 2019-02-13 DIAGNOSIS — E875 Hyperkalemia: Secondary | ICD-10-CM | POA: Diagnosis present

## 2019-02-13 DIAGNOSIS — Z8052 Family history of malignant neoplasm of bladder: Secondary | ICD-10-CM

## 2019-02-13 DIAGNOSIS — Z79899 Other long term (current) drug therapy: Secondary | ICD-10-CM

## 2019-02-13 DIAGNOSIS — B1920 Unspecified viral hepatitis C without hepatic coma: Secondary | ICD-10-CM | POA: Diagnosis present

## 2019-02-13 DIAGNOSIS — Z8249 Family history of ischemic heart disease and other diseases of the circulatory system: Secondary | ICD-10-CM

## 2019-02-13 DIAGNOSIS — Z7982 Long term (current) use of aspirin: Secondary | ICD-10-CM | POA: Insufficient documentation

## 2019-02-13 DIAGNOSIS — R109 Unspecified abdominal pain: Secondary | ICD-10-CM

## 2019-02-13 DIAGNOSIS — I509 Heart failure, unspecified: Secondary | ICD-10-CM | POA: Insufficient documentation

## 2019-02-13 DIAGNOSIS — F1910 Other psychoactive substance abuse, uncomplicated: Secondary | ICD-10-CM | POA: Diagnosis present

## 2019-02-13 DIAGNOSIS — R531 Weakness: Secondary | ICD-10-CM

## 2019-02-13 DIAGNOSIS — F418 Other specified anxiety disorders: Secondary | ICD-10-CM | POA: Diagnosis present

## 2019-02-13 DIAGNOSIS — I110 Hypertensive heart disease with heart failure: Secondary | ICD-10-CM | POA: Insufficient documentation

## 2019-02-13 DIAGNOSIS — Z8042 Family history of malignant neoplasm of prostate: Secondary | ICD-10-CM

## 2019-02-13 DIAGNOSIS — E039 Hypothyroidism, unspecified: Secondary | ICD-10-CM | POA: Diagnosis present

## 2019-02-13 DIAGNOSIS — J9811 Atelectasis: Secondary | ICD-10-CM | POA: Diagnosis present

## 2019-02-13 DIAGNOSIS — Z87891 Personal history of nicotine dependence: Secondary | ICD-10-CM

## 2019-02-13 DIAGNOSIS — E860 Dehydration: Secondary | ICD-10-CM | POA: Diagnosis present

## 2019-02-13 DIAGNOSIS — R112 Nausea with vomiting, unspecified: Secondary | ICD-10-CM | POA: Insufficient documentation

## 2019-02-13 DIAGNOSIS — F329 Major depressive disorder, single episode, unspecified: Secondary | ICD-10-CM | POA: Diagnosis present

## 2019-02-13 DIAGNOSIS — Z79890 Hormone replacement therapy (postmenopausal): Secondary | ICD-10-CM

## 2019-02-13 DIAGNOSIS — Z8619 Personal history of other infectious and parasitic diseases: Secondary | ICD-10-CM | POA: Diagnosis present

## 2019-02-13 LAB — CBC WITH DIFFERENTIAL/PLATELET
Abs Immature Granulocytes: 0.02 10*3/uL (ref 0.00–0.07)
Abs Immature Granulocytes: 0.03 10*3/uL (ref 0.00–0.07)
Basophils Absolute: 0 10*3/uL (ref 0.0–0.1)
Basophils Absolute: 0 10*3/uL (ref 0.0–0.1)
Basophils Relative: 0 %
Basophils Relative: 0 %
Eosinophils Absolute: 0.1 10*3/uL (ref 0.0–0.5)
Eosinophils Absolute: 0 10*3/uL (ref 0.0–0.5)
Eosinophils Relative: 1 %
Eosinophils Relative: 0 %
HCT: 42.2 % (ref 39.0–52.0)
HCT: 39.3 % (ref 39.0–52.0)
Hemoglobin: 13.9 g/dL (ref 13.0–17.0)
Hemoglobin: 13.1 g/dL (ref 13.0–17.0)
Immature Granulocytes: 0 %
Immature Granulocytes: 0 %
Lymphocytes Relative: 8 %
Lymphocytes Relative: 7 %
Lymphs Abs: 0.7 10*3/uL (ref 0.7–4.0)
Lymphs Abs: 0.7 10*3/uL (ref 0.7–4.0)
MCH: 30.1 pg (ref 26.0–34.0)
MCH: 30.7 pg (ref 26.0–34.0)
MCHC: 32.9 g/dL (ref 30.0–36.0)
MCHC: 33.3 g/dL (ref 30.0–36.0)
MCV: 91.3 fL (ref 80.0–100.0)
MCV: 92 fL (ref 80.0–100.0)
Monocytes Absolute: 0.4 10*3/uL (ref 0.1–1.0)
Monocytes Absolute: 0.7 10*3/uL (ref 0.1–1.0)
Monocytes Relative: 5 %
Monocytes Relative: 8 %
Neutro Abs: 6.9 10*3/uL (ref 1.7–7.7)
Neutro Abs: 7.8 10*3/uL — ABNORMAL HIGH (ref 1.7–7.7)
Neutrophils Relative %: 86 %
Neutrophils Relative %: 85 %
Platelets: 169 10*3/uL (ref 150–400)
Platelets: 174 10*3/uL (ref 150–400)
RBC: 4.62 MIL/uL (ref 4.22–5.81)
RBC: 4.27 MIL/uL (ref 4.22–5.81)
RDW: 12.5 % (ref 11.5–15.5)
RDW: 12.9 % (ref 11.5–15.5)
WBC: 8.1 10*3/uL (ref 4.0–10.5)
WBC: 9.3 10*3/uL (ref 4.0–10.5)
nRBC: 0 % (ref 0.0–0.2)
nRBC: 0 % (ref 0.0–0.2)

## 2019-02-13 LAB — HEPATIC FUNCTION PANEL
ALT: 29 U/L (ref 0–44)
AST: 39 U/L (ref 15–41)
Albumin: 3.8 g/dL (ref 3.5–5.0)
Alkaline Phosphatase: 54 U/L (ref 38–126)
Bilirubin, Direct: 0.3 mg/dL — ABNORMAL HIGH (ref 0.0–0.2)
Indirect Bilirubin: 0.5 mg/dL (ref 0.3–0.9)
Total Bilirubin: 0.8 mg/dL (ref 0.3–1.2)
Total Protein: 7.3 g/dL (ref 6.5–8.1)

## 2019-02-13 LAB — BASIC METABOLIC PANEL
Anion gap: 9 (ref 5–15)
Anion gap: 13 (ref 5–15)
BUN: 13 mg/dL (ref 6–20)
BUN: 13 mg/dL (ref 6–20)
CO2: 29 mmol/L (ref 22–32)
CO2: 25 mmol/L (ref 22–32)
Calcium: 8.9 mg/dL (ref 8.9–10.3)
Calcium: 8.6 mg/dL — ABNORMAL LOW (ref 8.9–10.3)
Chloride: 104 mmol/L (ref 98–111)
Chloride: 102 mmol/L (ref 98–111)
Creatinine, Ser: 0.8 mg/dL (ref 0.61–1.24)
Creatinine, Ser: 0.98 mg/dL (ref 0.61–1.24)
GFR calc Af Amer: >60 mL/min (ref >60)
GFR calc Af Amer: >60 mL/min (ref >60)
GFR calc non Af Amer: >60 mL/min (ref >60)
GFR calc non Af Amer: >60 mL/min (ref >60)
Glucose, Bld: 112 mg/dL — ABNORMAL HIGH (ref 70–99)
Glucose, Bld: 216 mg/dL — ABNORMAL HIGH (ref 70–99)
Potassium: 4.1 mmol/L (ref 3.5–5.1)
Potassium: 5.2 mmol/L — ABNORMAL HIGH (ref 3.5–5.1)
Sodium: 142 mmol/L (ref 135–145)
Sodium: 140 mmol/L (ref 135–145)

## 2019-02-13 LAB — SARS CORONAVIRUS 2 BY RT PCR (HOSPITAL ORDER, PERFORMED IN ~~LOC~~ HOSPITAL LAB): SARS Coronavirus 2: NEGATIVE

## 2019-02-13 LAB — LACTIC ACID, PLASMA
Lactic Acid, Venous: 2.7 mmol/L (ref 0.5–1.9)
Lactic Acid, Venous: 4.7 mmol/L (ref 0.5–1.9)
Lactic Acid, Venous: 2.8 mmol/L (ref 0.5–1.9)

## 2019-02-13 LAB — RAPID URINE DRUG SCREEN, HOSP PERFORMED
Amphetamines: NOT DETECTED
Barbiturates: NOT DETECTED
Benzodiazepines: POSITIVE — AB
Cocaine: NOT DETECTED
Opiates: POSITIVE — AB
Tetrahydrocannabinol: NOT DETECTED

## 2019-02-13 LAB — BLOOD GAS, ARTERIAL
Acid-Base Excess: 1.2 mmol/L (ref 0.0–2.0)
Bicarbonate: 24.6 mmol/L (ref 20.0–28.0)
FIO2: 36
O2 Saturation: 98.3 %
Patient temperature: 37.2
pCO2 arterial: 53.6 mmHg — ABNORMAL HIGH (ref 32.0–48.0)
pH, Arterial: 7.317 — ABNORMAL LOW (ref 7.350–7.450)
pO2, Arterial: 113 mmHg — ABNORMAL HIGH (ref 83.0–108.0)

## 2019-02-13 LAB — URINALYSIS, ROUTINE W REFLEX MICROSCOPIC
Bilirubin Urine: NEGATIVE
Glucose, UA: NEGATIVE mg/dL
Hgb urine dipstick: NEGATIVE
Ketones, ur: NEGATIVE mg/dL
Leukocytes,Ua: NEGATIVE
Nitrite: NEGATIVE
Protein, ur: NEGATIVE mg/dL
Specific Gravity, Urine: 1.04 — ABNORMAL HIGH (ref 1.005–1.030)
pH: 5 (ref 5.0–8.0)

## 2019-02-13 LAB — MRSA PCR SCREENING: MRSA by PCR: POSITIVE — AB

## 2019-02-13 LAB — CORTISOL: Cortisol, Plasma: 20.9 ug/dL

## 2019-02-13 LAB — PROCALCITONIN: Procalcitonin: 0.35 ng/mL

## 2019-02-13 LAB — TSH: TSH: 0.489 u[IU]/mL (ref 0.350–4.500)

## 2019-02-13 LAB — LIPASE, BLOOD: Lipase: 23 U/L (ref 11–51)

## 2019-02-13 MED ORDER — PANTOPRAZOLE SODIUM 40 MG PO TBEC
40.0000 mg | DELAYED_RELEASE_TABLET | Freq: Two times a day (BID) | ORAL | Status: DC
  Administered 2019-02-13 – 2019-02-15 (×4): 40 mg via ORAL
  Filled 2019-02-13 (×4): qty 1

## 2019-02-13 MED ORDER — ONDANSETRON 4 MG PO TBDP: 4.0000 mg | ORAL_TABLET | Freq: Three times a day (TID) | ORAL | 0 refills | Status: DC | PRN

## 2019-02-13 MED ORDER — SODIUM CHLORIDE 0.9 % IV BOLUS
1000.0000 mL | Freq: Once | INTRAVENOUS | Status: AC
  Administered 2019-02-13: 1000 mL via INTRAVENOUS

## 2019-02-13 MED ORDER — ACETAMINOPHEN 650 MG RE SUPP: 650.0000 mg | Freq: Four times a day (QID) | RECTAL | Status: DC | PRN

## 2019-02-13 MED ORDER — MUPIROCIN 2 % EX OINT
1.0000 "application " | TOPICAL_OINTMENT | Freq: Two times a day (BID) | CUTANEOUS | Status: DC
  Administered 2019-02-14 – 2019-02-15 (×3): 1 via NASAL
  Filled 2019-02-13 (×2): qty 22

## 2019-02-13 MED ORDER — ACETAMINOPHEN 325 MG PO TABS
650.0000 mg | ORAL_TABLET | Freq: Four times a day (QID) | ORAL | Status: DC | PRN
  Administered 2019-02-13 – 2019-02-15 (×4): 650 mg via ORAL
  Filled 2019-02-13 (×4): qty 2

## 2019-02-13 MED ORDER — SODIUM CHLORIDE 0.9 % IV BOLUS
1000.0000 mL | Freq: Once | INTRAVENOUS | Status: AC
  Administered 2019-02-13: 16:00:00 1000 mL via INTRAVENOUS

## 2019-02-13 MED ORDER — ENOXAPARIN SODIUM 40 MG/0.4ML ~~LOC~~ SOLN
40.0000 mg | SUBCUTANEOUS | Status: DC
  Administered 2019-02-13 – 2019-02-14 (×2): 40 mg via SUBCUTANEOUS
  Filled 2019-02-13 (×2): qty 0.4

## 2019-02-13 MED ORDER — ORAL CARE MOUTH RINSE
15.0000 mL | Freq: Two times a day (BID) | OROMUCOSAL | Status: DC
  Administered 2019-02-15: 15 mL via OROMUCOSAL

## 2019-02-13 MED ORDER — AMOXICILLIN-POT CLAVULANATE 875-125 MG PO TABS: 1.0000 | ORAL_TABLET | Freq: Two times a day (BID) | ORAL | 0 refills | Status: DC

## 2019-02-13 MED ORDER — ONDANSETRON HCL 4 MG PO TABS: 4.0000 mg | ORAL_TABLET | Freq: Four times a day (QID) | ORAL | Status: DC | PRN

## 2019-02-13 MED ORDER — SODIUM CHLORIDE 0.9 % IV SOLN
1.0000 g | Freq: Once | INTRAVENOUS | Status: AC
  Administered 2019-02-13: 14:00:00 1 g via INTRAVENOUS
  Filled 2019-02-13: qty 10

## 2019-02-13 MED ORDER — DEXAMETHASONE SODIUM PHOSPHATE 4 MG/ML IJ SOLN
4.0000 mg | Freq: Two times a day (BID) | INTRAMUSCULAR | Status: DC
  Administered 2019-02-13 – 2019-02-15 (×4): 4 mg via INTRAVENOUS
  Filled 2019-02-13 (×4): qty 1

## 2019-02-13 MED ORDER — DEXAMETHASONE SODIUM PHOSPHATE 4 MG/ML IJ SOLN: 4.0000 mg | Freq: Two times a day (BID) | INTRAMUSCULAR | Status: DC

## 2019-02-13 MED ORDER — CHLORHEXIDINE GLUCONATE CLOTH 2 % EX PADS
6.0000 | MEDICATED_PAD | Freq: Every day | CUTANEOUS | Status: DC
  Administered 2019-02-15: 6 via TOPICAL

## 2019-02-13 MED ORDER — ALPRAZOLAM 0.5 MG PO TABS: 0.5000 mg | ORAL_TABLET | Freq: Two times a day (BID) | ORAL | Status: DC | PRN

## 2019-02-13 MED ORDER — IOHEXOL 350 MG/ML SOLN
100.0000 mL | Freq: Once | INTRAVENOUS | Status: AC | PRN
  Administered 2019-02-13: 100 mL via INTRAVENOUS

## 2019-02-13 MED ORDER — ONDANSETRON HCL 4 MG/2ML IJ SOLN: 4.0000 mg | Freq: Four times a day (QID) | INTRAMUSCULAR | Status: DC | PRN

## 2019-02-13 MED ORDER — SODIUM CHLORIDE 0.9 % IV SOLN
500.0000 mg | Freq: Once | INTRAVENOUS | Status: AC
  Administered 2019-02-13: 500 mg via INTRAVENOUS
  Filled 2019-02-13: qty 500

## 2019-02-13 MED ORDER — SODIUM CHLORIDE 0.9 % IV BOLUS
1000.0000 mL | Freq: Once | INTRAVENOUS | Status: AC
  Administered 2019-02-13: 13:00:00 1000 mL via INTRAVENOUS

## 2019-02-13 MED ORDER — CHLORHEXIDINE GLUCONATE CLOTH 2 % EX PADS: 6.0000 | MEDICATED_PAD | Freq: Every day | CUTANEOUS | Status: DC

## 2019-02-13 MED ORDER — ONDANSETRON HCL 4 MG/2ML IJ SOLN
4.0000 mg | Freq: Once | INTRAMUSCULAR | Status: AC
  Administered 2019-02-13: 4 mg via INTRAVENOUS
  Filled 2019-02-13: qty 2

## 2019-02-13 MED ORDER — SODIUM CHLORIDE 0.9% FLUSH
3.0000 mL | Freq: Two times a day (BID) | INTRAVENOUS | Status: DC
  Administered 2019-02-13 – 2019-02-15 (×3): 3 mL via INTRAVENOUS

## 2019-02-13 MED ORDER — SODIUM CHLORIDE 0.9 % IV SOLN
INTRAVENOUS | Status: AC
  Administered 2019-02-13 – 2019-02-14 (×2): via INTRAVENOUS

## 2019-02-13 MED ORDER — LEVOTHYROXINE SODIUM 25 MCG PO TABS
25.0000 ug | ORAL_TABLET | Freq: Every day | ORAL | Status: DC
  Administered 2019-02-14 – 2019-02-15 (×2): 25 ug via ORAL
  Filled 2019-02-13 (×2): qty 1

## 2019-02-13 MED ORDER — ALBUTEROL SULFATE (2.5 MG/3ML) 0.083% IN NEBU: 2.5000 mg | INHALATION_SOLUTION | Freq: Four times a day (QID) | RESPIRATORY_TRACT | Status: DC | PRN

## 2019-02-13 NOTE — ED Notes (Signed)
Date and time results received: 02/13/19 1314  Test: ABG pH Critical Value: 7.113  Name of Provider Notified: Evalee Jefferson, PA  Orders Received? Or Actions Taken?: See chart

## 2019-02-13 NOTE — ED Provider Notes (Addendum)
[place] EMERGENCY DEPARTMENT Provider Note   CSN: [id number] Arrival date & time: 02/13/19  0027     [facility]   Chief Complaint Chief Complaint  Patient presents with   Cough    HPI Christian Ellison is a 58 y.o. male.     HPI   This a 58 year old male with a history of reflux, esophageal stenosis with multiple dilations, recurrent aspiration pneumonia who presents with concerns for aspiration pneumonia.  Patient reports that over the last 2 to 3 days he has had worsening nausea and vomiting.  He states that this is related to his known esophageal stricture.  He denies any abdominal pain.  He has not followed up with his GI doctor.  Since that time he has noted some dry cough and shortness of breath.  He was noted here to be febrile.  Unknown whether he had a temperature at home.  Patient denies any known sick contacts or COVID exposures.  Denies any chest pain, abdominal pain.  He is not normally on home oxygen.  I reviewed the patient's chart.  He had an admission in March for likely aspiration pneumonia.  At that time he was discharged on Augmentin.  He was hypoxic for several days.  Of note, patient's O2 sats 93% on room air.  No known history of smoking or asthma.  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    Opiate abuse, continuous [facility](HCC)     Patient Active Problem List   Diagnosis Date Noted   Bronchopneumonia 11/06/2018   Acute Respiratory failure with hypoxia (2/2 Asp PNA) 11/06/2018   Recurrent aspiration pneumonia (HCC)    Fever 06/09/2018   Tachycardia 06/09/2018   Hypoxia 06/09/2018   Hypotension 06/09/2018   Esophageal dysmotility/Esophageal stricture/stenosis/esophageal dysmotility with recurrent aspiration    Aspiration pneumonia of right lower lobe (HCC)    Sepsis (HCC)  05/26/2018   Pulmonary edema 02/02/2018   HCAP (healthcare-associated pneumonia) 02/02/2018   Weakness generalized 02/02/2018   Hypertension 02/02/2018   CHF (congestive heart failure) (HCC) 02/02/2018   Nausea & vomiting 02/02/2018   Dysphagia 02/02/2018   Esophageal stricture/stenosis/esophageal dysmotility with recurrent aspiration 02/02/2018   Esophageal stenosis 02/02/2018   GERD (gastroesophageal reflux disease) 02/02/2018   Polysubstance abuse (HCC) 02/02/2018   Acute respiratory failure with hypoxia (HCC)    Transaminitis    Elevated troponin    Overdose 01/02/2018   Hx of hepatitis C 01/17/2013   Depression 01/17/2013   Anxiety 01/17/2013   Right knee pain 01/17/2013    Past Surgical History:  Procedure Laterality Date   BIOPSY  03/18/2018   Procedure: BIOPSY;  Surgeon: Malissa Hippoehman, Najeeb U, MD;  Location: [facility];  Service: Endoscopy;;  gastric    COLONOSCOPY N/A 11/22/2013   Dr. Karilyn Cotaehman: Normal except for hemorrhoids.  Next colonoscopy 10 years.   ESOPHAGEAL DILATION N/A 03/18/2018   Procedure: ESOPHAGEAL DILATION;  Surgeon: Malissa Hippoehman, Najeeb U, MD;  Location: [facility];  Service: Endoscopy;  Laterality: N/A;   ESOPHAGEAL MANOMETRY N/A 06/15/2018   Procedure: ESOPHAGEAL MANOMETRY (EM);  Surgeon: Napoleon FormNandigam, Kavitha V, MD;  Location: [facility];  Service: Endoscopy;  Laterality: N/A;   ESOPHAGOGASTRODUODENOSCOPY (EGD) WITH PROPOFOL N/A 03/18/2018   Dr. Karilyn Cotaehman: Benign-appearing mild esophageal stenosis proximal to the GE J, status post dilation.  2 cm hiatal hernia.  Gastritis, biopsies negative for H. pylori   Fracture Right Leg     Patient has rod/screws in this leg   Rotor Cuff  2012   Right Shoulder        Home Medications    Prior to Admission medications   Medication Sig Start Date End Date Taking? Authorizing Provider  albuterol (PROVENTIL HFA;VENTOLIN HFA) 108 (90 Base) MCG/ACT inhaler Inhale 2 puffs into the lungs every 6 (six)  hours as needed for wheezing or shortness of breath. 08/22/18   Rodriguez-Southworth, Sunday Spillers, PA-C  ALPRAZolam Duanne Moron) 0.5 MG tablet Take 0.5 mg by mouth 2 (two) times daily.    [provider]  amoxicillin-clavulanate (AUGMENTIN) 875-125 MG tablet Take 1 tablet by mouth every 12 (twelve) hours. 02/13/19   Aidan Caloca, Barbette Hair, MD  aspirin EC 81 MG tablet Take 81 mg by mouth daily.    [provider]  atenolol (TENORMIN) 25 MG tablet Take 25 mg by mouth daily.    [provider]  levothyroxine (SYNTHROID, LEVOTHROID) 25 MCG tablet Take 25 mcg by mouth daily. 05/13/18   [provider]  Multiple Vitamin (MULTIVITAMIN WITH MINERALS) TABS tablet Take 1 tablet by mouth daily. 50+    [provider]  ondansetron (ZOFRAN ODT) 4 MG disintegrating tablet Take 1 tablet (4 mg total) by mouth every 8 (eight) hours as needed for nausea or vomiting. 02/13/19   Anfernee Peschke, Barbette Hair, MD  pantoprazole (PROTONIX) 40 MG tablet Take 1 tablet (40 mg total) by mouth 2 (two) times daily. 08/22/18 11/06/18  Rodriguez-Southworth, Sunday Spillers, PA-C  pantoprazole (PROTONIX) 40 MG tablet Take 40 mg by mouth 2 (two) times daily.    [provider]    Family History Family History  Problem Relation Age of Onset   Breast cancer Mother    Colon cancer Father    Bladder Cancer Father    Prostate cancer Father    Hypertension Sister    Hypothyroidism Sister    Healthy Sister    Healthy Daughter    Healthy Son    Healthy Son     Social History Social History   Tobacco Use   Smoking status: Never Smoker   Smokeless tobacco: Never Used  Substance Use Topics   Alcohol use: No    Comment: Patient states that it has been over a year   Drug use: Yes    Types: Benzodiazepines    Comment: opiates     Allergies   Patient has no known allergies.   Review of Systems Review of Systems  Constitutional: Positive for chills and fever.  Respiratory: Positive for  cough and shortness of breath.   Cardiovascular: Negative for chest pain and leg swelling.  Gastrointestinal: Positive for nausea and vomiting. Negative for anal bleeding.  Genitourinary: Negative for dysuria.  All other systems reviewed and are negative.    Physical Exam Updated Vital Signs BP 122/71    Pulse 100    Temp (!) 100.7 F (38.2 C) (Oral)    Resp 18    Ht 1.829 m (6')    Wt 84.8 kg    SpO2 94%    BMI 25.36 kg/m   Physical Exam Vitals signs and nursing note reviewed.  Constitutional:      Appearance: He is well-developed.  HENT:     Head: Normocephalic and atraumatic.  Neck:     Musculoskeletal: Neck supple.  Cardiovascular:     Rate and Rhythm: Normal rate and regular  rhythm.     Heart sounds: Normal heart sounds. No murmur.  Pulmonary:     Effort: Pulmonary effort is normal. No respiratory distress.     Breath sounds: Rhonchi present. No wheezing.  Abdominal:     General: Bowel sounds are normal.     Palpations: Abdomen is soft.     Tenderness: There is no abdominal tenderness. There is no rebound.  Lymphadenopathy:     Cervical: No cervical adenopathy.  Skin:    General: Skin is warm and dry.  Neurological:     Mental Status: He is alert and oriented to person, place, and time.  Psychiatric:        Mood and Affect: Mood normal.      ED Treatments / Results  Labs (all labs ordered are listed, but only abnormal results are displayed) Labs Reviewed  BASIC METABOLIC PANEL - Abnormal; Notable for the following components:      Result Value   Glucose, Bld 112 (*)    All other components within normal limits  SARS CORONAVIRUS 2 (HOSPITAL ORDER, PERFORMED IN [facility])  CBC WITH DIFFERENTIAL/PLATELET    EKG EKG Interpretation  Date/Time:  Monday February 13 2019 02:48:02 EDT Ventricular Rate:  81 PR Interval:    QRS Duration: 89 QT Interval:  399 QTC Calculation: 464 R Axis:   38 Text Interpretation:  Sinus rhythm Borderline T wave  abnormalities Confirmed by Ross MarcusHorton, Dina Warbington ([id number]) on 02/13/2019 3:31:47 AM   Radiology Dg Chest Portable 1 View  Result Date: 02/13/2019 CLINICAL DATA:  Cough and fever. EXAM: PORTABLE CHEST 1 VIEW COMPARISON:  November 06, 2018 FINDINGS: The previously noted right lower lobe pneumonia has significantly improved. There is a persistent retrocardiac opacity. There is no pneumothorax. No large pleural effusion. No acute osseous abnormality. IMPRESSION: 1. Significant interval improvement in the right lower lobe pneumonia. 2. Persistent faint retrocardiac opacity which may represent atelectasis or persistent infiltrate. 3. No definite radiographic evidence for recurring aspiration pneumonia. Electronically Signed   By: Katherine Mantlehristopher  Green M.D.   On: 02/13/2019 01:02    Procedures Procedures (including critical care time)  Medications Ordered in ED Medications  ondansetron (ZOFRAN) injection 4 mg (4 mg Intravenous Given 02/13/19 0340)  sodium chloride 0.9 % bolus 1,000 mL (0 mLs Intravenous Stopped 02/13/19 0340)     Initial Impression / Assessment and Plan / ED Course  I have reviewed the triage vital signs and the nursing notes.  Pertinent labs & imaging results that were available during my care of the patient were reviewed by me and considered in my medical decision making (see chart for details).        Patient presents with shortness of breath.  Noted to be febrile.  He is overall nontoxic-appearing and otherwise his vital signs are notable for pulse ox of 93% on room air.  He has some rhonchi on exam but is in no respiratory distress.  X-ray is actually improved from prior but this may not reflect acute events.  Patient was given Zofran.  He has no significant leukocytosis.  Given current pandemic, COVID testing was obtained and is negative.  Patient was able to ambulate and maintain his pulse ox greater than 91%.  Given his history which is highly suggestive of possible aspiration, will  treat with Augmentin for likely aspiration pneumonia which is not showing up yet on chest x-ray.  Patient was given strict return precautions.  Also recommend follow-up with GI.  Christian Ellison was  evaluated in Emergency Department on 02/13/2019 for the symptoms described in the history of present illness. He was evaluated in the context of the global COVID-19 pandemic, which necessitated consideration that the patient might be at risk for infection with the SARS-CoV-2 virus that causes COVID-19. Institutional protocols and algorithms that pertain to the evaluation of patients at risk for COVID-19 are in a state of rapid change based on information released by regulatory bodies including the CDC and federal and state organizations. These policies and algorithms were followed during the patient's care in the ED.   After history, exam, and medical workup I feel the patient has been appropriately medically screened and is safe for discharge home. Pertinent diagnoses were discussed with the patient. Patient was given return precautions.   Final Clinical Impressions(s) / ED Diagnoses   Final diagnoses:  Aspiration pneumonia due to gastric secretions, unspecified laterality, unspecified part of lung (HCC)  Non-intractable vomiting with nausea, unspecified vomiting type    ED Discharge Orders         Ordered    amoxicillin-clavulanate (AUGMENTIN) 875-125 MG tablet  Every 12 hours     02/13/19 0350    ondansetron (ZOFRAN ODT) 4 MG disintegrating tablet  Every 8 hours PRN     02/13/19 0350           Chidera Thivierge, Mayer Maskerourtney F, MD 02/13/19 [id number]    Shon BatonHorton, Alaisha Eversley F, MD 02/13/19 [phone]

## 2019-02-13 NOTE — Discharge Instructions (Signed)
You were seen here for possible pneumonia.  You did have a fever and cough.  Your x-ray does not show any significant infiltrate.  However, given your fever and shortness of breath, you will be treated with Augmentin for 7 days.  If you develop worsening shortness of breath, chest pain, any new or worsening symptoms you should be reevaluated.

## 2019-02-13 NOTE — H&P (Signed)
History and Physical    Christian Ellison XBW:[id number] DOB: 10/06/1960 DOA: 02/13/2019  PCP: Gwenlyn Saran Washburn  Patient coming from: Home  I have personally briefly reviewed patient's old medical records in [place]  Chief Complaint: Generalized weakness/fatigue  HPI: Christian Ellison is a 58 y.o. male with medical history significant for history of esophageal spasms and stricture/stenosis with history of recurrent aspiration pneumonia, hepatitis C, history of chronic pain and anxiety, hypertension, and hypothyroidism who initially presented to the ED morning of 02/13/2019 with reported nausea and vomiting.  He had a chest x-ray performed which was unrevealing, however there was concern for early aspiration pneumonia which was not showing up on imaging and patient was discharged home with a prescription for oral Augmentin.  Patient returned to the ED several hours later with reported worsening fatigue/lethargy, weakness, and shortness of breath.  He had also reported decreased oral intake and nausea over the last several days which he felt was consistent with his chronic esophageal issues.  On my examination patient just reports generalized fatigue.  He denies subjective fevers, chills, diaphoresis, chest pain, dyspnea, abdominal pain, nausea, vomiting, dysuria, or diarrhea.  He does say that he has been feeling unwell this whole year.  He has been feeling depressed due to the loss of his son earlier in the year.  He states he has been taking Harvoni for his hepatitis C and is almost finished with his course.  He denies any recent medication changes.  Reports a former history of tobacco use, no recent use.  He denies any alcohol use.  He denies any cocaine or IV drug use.  He denies any narcotic/opioid use.  He reports previously taking Xanax for anxiety however says that he has not been taking it for a while as his physician stopped prescribing it.  ED Course:  In  the ED, initial vitals showed BP 85/62, pulse 102, RR 16, temp 99 Fahrenheit, SPO2 88% on room air.  Patient placed on 3 L supplemental O2 via [place] with improvement in oxygen saturations.  Labs are notable for WBC 9.3, hemoglobin 13.1, platelets 134,000, potassium 5.2, sodium 140, bicarb 25, BUN 13, creatinine 0.98, serum glucose 216, AST 39, ALT 29, alk phos 54, total bilirubin 0.8, lipase 23, lactic acid 2.7, repeat lactic acid 4.7.  ABG showed pH 7.317, PCO2 53.6, PO2 113 on 36% FiO2.  Blood cultures were drawn.  Urinalysis and UDS were ordered and pending.  X-ray showed interval clearing the left retrocardiac region from prior without focal infiltrate.  CTA chest PE study was negative for pulmonary emboli.  Mild bibasilar atelectasis was seen without evidence to suggest aspiration pneumonia.  Fluid within the esophagus was noted.  Hazy inflammatory changes surrounding the adrenal glands within the upper abdominal fat was seen.  Patient was given 4 L normal saline, IV ceftriaxone and azithromycin and the hospitalist service was consulted to admit for further evaluation and management.  Review of Systems: All systems reviewed and are negative except as documented in history of present illness above.   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    Opiate abuse, continuous [facility])     Past Surgical History:  Procedure Laterality Date   BIOPSY  03/18/2018   Procedure: BIOPSY;  Surgeon: Hildred Laser  U, MD;  Location: [facility];  Service: Endoscopy;;  gastric    COLONOSCOPY N/A 11/22/2013   Dr. Laural Golden: Normal except for hemorrhoids.  Next colonoscopy 10 years.   ESOPHAGEAL DILATION N/A 03/18/2018   Procedure: ESOPHAGEAL DILATION;  Surgeon: Rogene Houston, MD;  Location: [facility];  Service: Endoscopy;  Laterality: N/A;    ESOPHAGEAL MANOMETRY N/A 06/15/2018   Procedure: ESOPHAGEAL MANOMETRY (EM);  Surgeon: Mauri Pole, MD;  Location: [facility];  Service: Endoscopy;  Laterality: N/A;   ESOPHAGOGASTRODUODENOSCOPY (EGD) WITH PROPOFOL N/A 03/18/2018   Dr. Laural Golden: Benign-appearing mild esophageal stenosis proximal to the GE J, status post dilation.  2 cm hiatal hernia.  Gastritis, biopsies negative for H. pylori   Fracture Right Leg     Patient has rod/screws in this leg   Rotor Cuff  2012   Right Shoulder    Social History:  reports that he has never smoked. He has never used smokeless tobacco. He reports current drug use. Drug: Benzodiazepines. He reports that he does not drink alcohol.  No Known Allergies  Family History  Problem Relation Age of Onset   Breast cancer Mother    Colon cancer Father    Bladder Cancer Father    Prostate cancer Father    Hypertension Sister    Hypothyroidism Sister    Healthy Sister    Healthy Daughter    Healthy Son    Healthy Son      Prior to Admission medications   Medication Sig Start Date End Date Taking? Authorizing Provider  albuterol (PROVENTIL HFA;VENTOLIN HFA) 108 (90 Base) MCG/ACT inhaler Inhale 2 puffs into the lungs every 6 (six) hours as needed for wheezing or shortness of breath. 08/22/18  Yes Rodriguez-Southworth, Sunday Spillers, PA-C  ALPRAZolam Duanne Moron) 0.5 MG tablet Take 0.5 mg by mouth 2 (two) times daily.   Yes [provider]  amoxicillin-clavulanate (AUGMENTIN) 875-125 MG tablet Take 1 tablet by mouth every 12 (twelve) hours. 02/13/19  Yes Horton, Barbette Hair, MD  aspirin EC 81 MG tablet Take 81 mg by mouth daily.   Yes [provider]  atenolol (TENORMIN) 25 MG tablet Take 25 mg by mouth daily.   Yes [provider]  levothyroxine (SYNTHROID, LEVOTHROID) 25 MCG tablet Take 25 mcg by mouth daily. 05/13/18  Yes [provider]  Multiple Vitamin (MULTIVITAMIN WITH MINERALS) TABS tablet Take 1 tablet  by mouth daily. 50+   Yes [provider]  ondansetron (ZOFRAN ODT) 4 MG disintegrating tablet Take 1 tablet (4 mg total) by mouth every 8 (eight) hours as needed for nausea or vomiting. 02/13/19  Yes Horton, Barbette Hair, MD  pantoprazole (PROTONIX) 40 MG tablet Take 1 tablet (40 mg total) by mouth 2 (two) times daily. 08/22/18 02/13/19 Yes Rodriguez-SouthworthSunday Spillers, PA-C    Physical Exam: Vitals:   02/13/19 1736 02/13/19 1740 02/13/19 1800 02/13/19 1846  BP: (!) 68/53 (!) 89/77 92/66 108/78  Pulse: 87 90 88 84  Resp: 18 (!) _0 Temp:    99.4 F (37.4 C)  TempSrc:    Oral  SpO2: 93% 100% 96% 100%  Weight:    88.2 kg  Height:    6' (1.829 m)    Constitutional: Resting in the right lateral decubitus position in bed, NAD, calm, appears tired but comfortable Eyes: PERRL, lids and conjunctivae normal ENMT: Mucous membranes are dry. Posterior pharynx clear of any exudate or lesions.Normal dentition.  Neck: normal, supple, no masses. Respiratory: clear to  auscultation bilaterally, no wheezing, no crackles. Normal respiratory effort. No accessory muscle use.  Cardiovascular: Regular rate and rhythm, no murmurs / rubs / gallops. No extremity edema. 2+ pedal pulses.  Cap refill less than 2 seconds. Abdomen: no tenderness, no masses palpated. No hepatosplenomegaly. Bowel sounds positive.  Musculoskeletal: no clubbing / cyanosis. No joint deformity upper and lower extremities. Good ROM, no contractures. Normal muscle tone.  Skin: no rashes, lesions, ulcers. No induration Neurologic: CN 2-12 grossly intact. Sensation intact, Strength 5/5 in all 4.  Psychiatric: Normal judgment and insight. Alert and oriented x 3. Normal mood.     Labs on Admission: I have personally reviewed following labs and imaging studies  CBC: Recent Labs  Lab 02/13/19 0117 02/13/19 1246  WBC 8.1 9.3  NEUTROABS 6.9 7.8*  HGB 13.9 13.1  HCT 42.2 39.3  MCV 91.3 92.0  PLT 169 038   Basic Metabolic  Panel: Recent Labs  Lab 02/13/19 0117 02/13/19 1246  NA 142 140  K 4.1 5.2*  CL 104 102  CO2 29 25  GLUCOSE 112* 216*  BUN 13 13  CREATININE 0.80 0.98  CALCIUM 8.9 8.6*   GFR: Estimated Creatinine Clearance: 91.3 mL/min (by C-G formula based on SCr of 0.98 mg/dL). Liver Function Tests: Recent Labs  Lab 02/13/19 1246  AST 39  ALT 29  ALKPHOS 54  BILITOT 0.8  PROT 7.3  ALBUMIN 3.8   Recent Labs  Lab 02/13/19 1246  LIPASE 23   No results for input(s): AMMONIA in the last 168 hours. Coagulation Profile: No results for input(s): INR, PROTIME in the last 168 hours. Cardiac Enzymes: No results for input(s): CKTOTAL, CKMB, CKMBINDEX, TROPONINI in the last 168 hours. BNP (last 3 results) No results for input(s): PROBNP in the last 8760 hours. HbA1C: No results for input(s): HGBA1C in the last 72 hours. CBG: No results for input(s): GLUCAP in the last 168 hours. Lipid Profile: No results for input(s): CHOL, HDL, LDLCALC, TRIG, CHOLHDL, LDLDIRECT in the last 72 hours. Thyroid Function Tests: Recent Labs    02/13/19 0118  TSH 0.489   Anemia Panel: No results for input(s): VITAMINB12, FOLATE, FERRITIN, TIBC, IRON, RETICCTPCT in the last 72 hours. Urine analysis:    Component Value Date/Time   COLORURINE YELLOW 11/06/2018 1042   APPEARANCEUR CLEAR 11/06/2018 1042   LABSPEC 1.011 11/06/2018 1042   PHURINE 6.0 11/06/2018 1042   GLUCOSEU NEGATIVE 11/06/2018 1042   HGBUR NEGATIVE 11/06/2018 1042   BILIRUBINUR NEGATIVE 11/06/2018 1042   KETONESUR NEGATIVE 11/06/2018 1042   PROTEINUR NEGATIVE 11/06/2018 1042   NITRITE NEGATIVE 11/06/2018 1042   LEUKOCYTESUR NEGATIVE 11/06/2018 1042    Radiological Exams on Admission: Ct Angio Chest Pe W And/or Wo Contrast  Result Date: 02/13/2019 CLINICAL DATA:  Dry cough and shortness of breath EXAM: CT ANGIOGRAPHY CHEST WITH CONTRAST TECHNIQUE: Multidetector CT imaging of the chest was performed using the standard protocol during  bolus administration of intravenous contrast. Multiplanar CT image reconstructions and MIPs were obtained to evaluate the vascular anatomy. CONTRAST:  147m OMNIPAQUE 350 COMPARISON:  Chest x-ray from earlier in the same day, CT from 06/09/2018. FINDINGS: Cardiovascular: Thoracic aorta is within normal limits with only minimal atherosclerotic calcifications identified. No coronary calcifications are seen. The pulmonary artery shows a normal branching pattern without focal filling defect to suggest pulmonary embolism. Mediastinum/Nodes: Thoracic inlet is within normal limits. The esophagus demonstrates fluid within which would correspond with the patient's given clinical history of reflux and aspiration. No significant lymphadenopathy  is identified. Lungs/Pleura: Lungs are well aerated bilaterally. Very minimal atelectatic changes are noted right greater than left. No focal parenchymal nodule is seen. No focal confluent infiltrate is identified. Previously seen nodular densities have resolved in the interval. Upper Abdomen: Visualized upper abdomen demonstrates some mild inflammatory changes surrounding the adrenal glands and celiac axis. A portion of this may be related to the patient's underlying vomiting. CT of the abdomen and pelvis may be helpful for further evaluation. Musculoskeletal: Degenerative changes of the thoracic spine are noted. No acute rib abnormality is seen. Review of the MIP images confirms the above findings. IMPRESSION: No evidence of pulmonary emboli. Mild bibasilar atelectasis. No findings to suggest aspiration pneumonia are noted. Fluid within the esophagus consistent with the patient's given clinical history of reflux. Hazy inflammatory changes surrounding the adrenal glands within the upper abdominal fat. Possibility of underlying pancreatitis would deserve consideration although this may be related to recent vomiting episodes. CT of the abdomen and pelvis and correlation with lab values  may be helpful. Aortic Atherosclerosis (ICD10-I70.0). Electronically Signed   By: Inez Catalina M.D.   On: 02/13/2019 16:11   Dg Chest Portable 1 View  Result Date: 02/13/2019 CLINICAL DATA:  Productive cough and shortness of breath EXAM: PORTABLE CHEST 1 VIEW COMPARISON:  02/13/2019 FINDINGS: Cardiac shadow is within normal limits. Lungs are well aerated bilaterally. No focal infiltrate is seen. Interval clearing in the left retrocardiac region is noted consistent with previous atelectasis. No bony abnormality is noted. IMPRESSION: No active disease. Electronically Signed   By: Inez Catalina M.D.   On: 02/13/2019 13:15   Dg Chest Portable 1 View  Result Date: 02/13/2019 CLINICAL DATA:  Cough and fever. EXAM: PORTABLE CHEST 1 VIEW COMPARISON:  November 06, 2018 FINDINGS: The previously noted right lower lobe pneumonia has significantly improved. There is a persistent retrocardiac opacity. There is no pneumothorax. No large pleural effusion. No acute osseous abnormality. IMPRESSION: 1. Significant interval improvement in the right lower lobe pneumonia. 2. Persistent faint retrocardiac opacity which may represent atelectasis or persistent infiltrate. 3. No definite radiographic evidence for recurring aspiration pneumonia. Electronically Signed   By: Constance Holster M.D.   On: 02/13/2019 01:02    EKG: Independently reviewed. Sinus tachycardia, nonspecific T wave changes leads III, aVF rate is faster when compared to prior.  Assessment/Plan Principal Problem:   Hypotension Active Problems:   Hx of hepatitis C   Anxiety with depression   Weakness generalized   Polysubstance abuse ([place])   Esophageal dysmotility/Esophageal stricture/stenosis/esophageal dysmotility with recurrent aspiration  Daltyn Degroat Boese is a 58 y.o. male with medical history significant for history of esophageal spasms and stricture/stenosis with history of recurrent aspiration pneumonia, hepatitis C, history of chronic pain and  anxiety, hypertension, and hypothyroidism who is admitted with hypotension and generalized weakness/fatigue.   Hypotension/fatigue/generalized weakness: Unspecified cause, initially felt due to sepsis however no obvious infectious source.  Chest x-ray and CT chest without evidence of pneumonia.  Nonspecific inflammatory changes around the adrenal glands was noted.  He remains dehydrated, will continue aggressive fluid resuscitation.  Also question of adrenal insufficiency. -Additional 1 L normal saline bolus ordered, follow with maintenance IV fluids overnight -Start empiric IV dexamethasone 4 mg q12h for possible adrenal insufficiency -Check cortisol, ACTH, aldosterone/renin activity -Holding further antibiotics for now -Follow-up urinalysis and UDS -TSH is 0.489 -Will obtain CT abdomen/pelvis for further evaluation  History esophageal dysmotility/strictures status post dilations with recurrent aspiration: No evidence of recurrent aspiration on admission.  May be having continued poor oral intake from his known esophageal dysfunction, however he denies any significant change. -Continue IV fluid resuscitation as above -Diet as tolerated -Continue Protonix  Mild hyperkalemia: K 5.2 on admission.  Anticipate this will improve with fluid resuscitation.  Recheck in a.m.  Hypothyroidism: -TSH is 0.489 -Continue home Synthroid  History of hepatitis C: Follows with GI, he is on treatment with Harvoni for total of 8 weeks.  States he has only a few days of treatment left.  He will need to bring his home supply to complete the course, discussed with pharmacy and nursing staff.  History of substance use: Previously documented opioid and benzodiazepine dependence.  Patient denies taking either recently. -Follow-up UDS  Anxiety/depression: Has been on Xanax in the past, however states has not been prescribed any longer by his physician.  [place] controlled substance database is consistent with last  refill in January 2020.   DVT prophylaxis: Lovenox Code Status: Full code, confirmed with patient Family Communication: Discussed with patient significant other Disposition Plan: Pending clinical progress Consults called: None Admission status: Admit - It is my clinical opinion that admission to INPATIENT is reasonable and necessary because of the expectation that this patient will require hospital care that crosses at least 2 midnights to treat this condition based on the medical complexity of the problems presented.  Given the aforementioned information, the predictability of an adverse outcome is felt to be significant.      Zada Finders MD [facility]  If 7PM-7AM, please contact night-coverage [email or web address]  02/13/2019, 7:29 PM

## 2019-02-13 NOTE — ED Triage Notes (Signed)
Pt here cause he states he thinks he has aspiration pneumonia; pt states he has been coughing up frothy sputum; pt has a hx of aspiration pneumonia

## 2019-02-13 NOTE — ED Notes (Signed)
Date and time results received: 02/13/19 1640  Test: Lactic Acid Critical Value: 4.7  Name of Provider Notified: Evalee Jefferson, PA  Orders Received? Or Actions Taken?: See chart

## 2019-02-13 NOTE — ED Triage Notes (Signed)
Per EMS, pt d/c from ED early this morning with aspiration pneumonia. Pt's girlfriend called EMS because he was lethargic and her home pulse ox showed sats in 40s. EMS arrived, sats were in 90s, pt alert, but reported that he "just tired." Pt fell asleep en route and sats dropped into the 70s on RA. Pt placed on 3L nasal cannula by EMS. Pt AOx4 upon arrival to ED.

## 2019-02-13 NOTE — Progress Notes (Signed)
CRITICAL VALUE ALERT  Critical Value: LA 2.8  Date & Time Notied:  2120 02/13/19  Provider Notified: Posey Pronto aware; LA trending down  Orders Received/Actions taken: None at this time

## 2019-02-13 NOTE — ED Notes (Signed)
ED Provider at bedside. 

## 2019-02-13 NOTE — Progress Notes (Signed)
Notified bedside nurse of need to draw lactic acid.  

## 2019-02-13 NOTE — ED Notes (Signed)
Received call from [place] in the lab. States there was an error in pH critical result. States they will change in chart. EDP notified.

## 2019-02-13 NOTE — ED Provider Notes (Addendum)
[facility]GENCY DEPARTMENT Provider Note   CSN: [id number] Arrival date & time: 02/13/19  1212    History   Chief Complaint Chief Complaint  Patient presents with   Fatigue    HPI Christian Ellison is a 58 y.o. male   With a history of acid reflux, esophageal stenosis with a history of multiple dilations with complications of recurrent aspiration pneumonia presenting for reevaluation of concern for aspiration pneumonia.  He was seen here last night at which time his lab tests and chest x-ray were stable with no evidence for pneumonia, also he was COVID negative, but did have bilateral rhonchi on exam and was treated symptomatically for possible early aspiration pneumonia, was placed on Augmentin.  He returns this morning secondary to increased sob,  weakness and lethargy.  He is concerned about dehydration due to poor p.o. intake over the past several days, he has had nausea  which he states is consistent with his chronic esophageal issues, he denies abdominal pain and chest pain.  Subjective fever at home, his temperature last night was 100.7 here.  He reports coughing up a white foamy type substance, denies overt vomiting.  Patient had a negative coronavirus test at his earlier visit today.  The history is provided by the patient and medical records.    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    Opiate abuse, continuous [facility](HCC)     Patient Active Problem List   Diagnosis Date Noted   Bronchopneumonia 11/06/2018   Acute Respiratory failure with hypoxia (2/2 Asp PNA) 11/06/2018   Recurrent aspiration pneumonia (HCC)    Fever 06/09/2018   Tachycardia 06/09/2018   Hypoxia 06/09/2018   Hypotension 06/09/2018   Esophageal dysmotility/Esophageal stricture/stenosis/esophageal dysmotility with  recurrent aspiration    Aspiration pneumonia of right lower lobe (HCC)    Sepsis (HCC) 05/26/2018   Pulmonary edema 02/02/2018   HCAP (healthcare-associated pneumonia) 02/02/2018   Weakness generalized 02/02/2018   Hypertension 02/02/2018   CHF (congestive heart failure) (HCC) 02/02/2018   Nausea & vomiting 02/02/2018   Dysphagia 02/02/2018   Esophageal stricture/stenosis/esophageal dysmotility with recurrent aspiration 02/02/2018   Esophageal stenosis 02/02/2018   GERD (gastroesophageal reflux disease) 02/02/2018   Polysubstance abuse (HCC) 02/02/2018   Acute respiratory failure with hypoxia (HCC)    Transaminitis    Elevated troponin    Overdose 01/02/2018   Hx of hepatitis C 01/17/2013   Depression 01/17/2013   Anxiety 01/17/2013   Right knee pain 01/17/2013    Past Surgical History:  Procedure Laterality Date   BIOPSY  03/18/2018   Procedure: BIOPSY;  Surgeon: Malissa Hippoehman, Najeeb U, MD;  Location: [facility];  Service: Endoscopy;;  gastric    COLONOSCOPY N/A 11/22/2013   Dr. Karilyn Cotaehman: Normal except for hemorrhoids.  Next colonoscopy 10 years.   ESOPHAGEAL DILATION N/A 03/18/2018   Procedure: ESOPHAGEAL DILATION;  Surgeon: Malissa Hippoehman, Najeeb U, MD;  Location: [facility];  Service: Endoscopy;  Laterality: N/A;   ESOPHAGEAL MANOMETRY N/A 06/15/2018   Procedure: ESOPHAGEAL MANOMETRY (EM);  Surgeon: Napoleon FormNandigam, Kavitha V, MD;  Location: [facility];  Service: Endoscopy;  Laterality: N/A;   ESOPHAGOGASTRODUODENOSCOPY (EGD) WITH PROPOFOL N/A 03/18/2018   Dr. Karilyn Cotaehman: Benign-appearing mild esophageal stenosis proximal to the GE J, status post dilation.  2 cm hiatal hernia.  Gastritis, biopsies negative for H. pylori   Fracture Right Leg     Patient has rod/screws in this leg   Rotor Cuff  2012   Right Shoulder        Home Medications    Prior to Admission medications   Medication Sig Start Date End Date Taking? Authorizing Provider  albuterol (PROVENTIL  HFA;VENTOLIN HFA) 108 (90 Base) MCG/ACT inhaler Inhale 2 puffs into the lungs every 6 (six) hours as needed for wheezing or shortness of breath. 08/22/18  Yes Rodriguez-Southworth, Nettie Elm, PA-C  ALPRAZolam Prudy Feeler) 0.5 MG tablet Take 0.5 mg by mouth 2 (two) times daily.   Yes [provider]  amoxicillin-clavulanate (AUGMENTIN) 875-125 MG tablet Take 1 tablet by mouth every 12 (twelve) hours. 02/13/19  Yes Horton, Mayer Masker, MD  aspirin EC 81 MG tablet Take 81 mg by mouth daily.   Yes [provider]  atenolol (TENORMIN) 25 MG tablet Take 25 mg by mouth daily.   Yes [provider]  levothyroxine (SYNTHROID, LEVOTHROID) 25 MCG tablet Take 25 mcg by mouth daily. 05/13/18  Yes [provider]  Multiple Vitamin (MULTIVITAMIN WITH MINERALS) TABS tablet Take 1 tablet by mouth daily. 50+   Yes [provider]  ondansetron (ZOFRAN ODT) 4 MG disintegrating tablet Take 1 tablet (4 mg total) by mouth every 8 (eight) hours as needed for nausea or vomiting. 02/13/19  Yes Horton, Mayer Masker, MD  pantoprazole (PROTONIX) 40 MG tablet Take 1 tablet (40 mg total) by mouth 2 (two) times daily. 08/22/18 02/13/19 Yes Rodriguez-Southworth, Nettie Elm, PA-C    Family History Family History  Problem Relation Age of Onset   Breast cancer Mother    Colon cancer Father    Bladder Cancer Father    Prostate cancer Father    Hypertension Sister    Hypothyroidism Sister    Healthy Sister    Healthy Daughter    Healthy Son    Healthy Son     Social History Social History   Tobacco Use   Smoking status: Never Smoker   Smokeless tobacco: Never Used  Substance Use Topics   Alcohol use: No    Comment: Patient states that it has been over a year   Drug use: Yes    Types: Benzodiazepines    Comment: opiates     Allergies   Patient has no known allergies.   Review of Systems Review of Systems  Constitutional: Positive for chills, fatigue and fever.  HENT:  Negative for congestion and sore throat.   Eyes: Negative.   Respiratory: Positive for shortness of breath. Negative for chest tightness.   Cardiovascular: Negative for chest pain.  Gastrointestinal: Positive for nausea. Negative for abdominal pain.  Genitourinary: Negative.   Musculoskeletal: Negative for arthralgias, joint swelling and neck pain.  Skin: Negative.  Negative for rash and wound.  Neurological: Positive for weakness. Negative for dizziness, light-headedness, numbness and headaches.  Psychiatric/Behavioral: Negative.      Physical Exam Updated Vital Signs BP (!) 109/52 (BP Location: Left Arm)    Pulse 88    Temp 99 F (37.2 C) (Oral)    Resp 14    Ht 6' (1.829 m)    Wt 83.9 kg    SpO2 99%    BMI 25.09 kg/m   Physical Exam Vitals signs and nursing note reviewed.  Constitutional:      Appearance: He is well-developed.  HENT:     Head: Normocephalic and atraumatic.  Eyes:  Conjunctiva/sclera: Conjunctivae normal.  Neck:     Musculoskeletal: Normal range of motion.  Cardiovascular:     Rate and Rhythm: Normal rate and regular rhythm.     Heart sounds: Normal heart sounds.  Pulmonary:     Effort: Pulmonary effort is normal.     Breath sounds: Rhonchi present. No wheezing.     Comments: Bilateral rhonchi present. Abdominal:     General: Bowel sounds are normal.     Palpations: Abdomen is soft.     Tenderness: There is no abdominal tenderness.  Musculoskeletal: Normal range of motion.  Skin:    General: Skin is warm and dry.  Neurological:     Mental Status: He is lethargic.      ED Treatments / Results  Labs (all labs ordered are listed, but only abnormal results are displayed) Labs Reviewed  LACTIC ACID, PLASMA - Abnormal; Notable for the following components:      Result Value   Lactic Acid, Venous 2.7 (*)    All other components within normal limits  BLOOD GAS, ARTERIAL - Abnormal; Notable for the following components:   pH, Arterial 7.317 (*)      pCO2 arterial 53.6 (*)    pO2, Arterial 113 (*)    All other components within normal limits  CBC WITH DIFFERENTIAL/PLATELET - Abnormal; Notable for the following components:   Neutro Abs 7.8 (*)    All other components within normal limits  BASIC METABOLIC PANEL - Abnormal; Notable for the following components:   Potassium 5.2 (*)    Glucose, Bld 216 (*)    Calcium 8.6 (*)    All other components within normal limits  LACTIC ACID, PLASMA - Abnormal; Notable for the following components:   Lactic Acid, Venous 4.7 (*)    All other components within normal limits  CULTURE, BLOOD (ROUTINE X 2)  CULTURE, BLOOD (ROUTINE X 2)  URINALYSIS, ROUTINE W REFLEX MICROSCOPIC  HEPATIC FUNCTION PANEL  LIPASE, BLOOD  RAPID URINE DRUG SCREEN, HOSP PERFORMED    EKG EKG Interpretation  Date/Time:  Monday February 13 2019 12:23:14 EDT Ventricular Rate:  101 PR Interval:    QRS Duration: 90 QT Interval:  354 QTC Calculation: 459 R Axis:   -10 Text Interpretation:  Sinus tachycardia rate is faster compared to earlier in the day Confirmed by Sherwood Gambler [phone]) on 02/13/2019 1:32:18 PM   Radiology Ct Angio Chest Pe W And/or Wo Contrast  Result Date: 02/13/2019 CLINICAL DATA:  Dry cough and shortness of breath EXAM: CT ANGIOGRAPHY CHEST WITH CONTRAST TECHNIQUE: Multidetector CT imaging of the chest was performed using the standard protocol during bolus administration of intravenous contrast. Multiplanar CT image reconstructions and MIPs were obtained to evaluate the vascular anatomy. CONTRAST:  167mL OMNIPAQUE 350 COMPARISON:  Chest x-ray from earlier in the same day, CT from 06/09/2018. FINDINGS: Cardiovascular: Thoracic aorta is within normal limits with only minimal atherosclerotic calcifications identified. No coronary calcifications are seen. The pulmonary artery shows a normal branching pattern without focal filling defect to suggest pulmonary embolism. Mediastinum/Nodes: Thoracic inlet is  within normal limits. The esophagus demonstrates fluid within which would correspond with the patient's given clinical history of reflux and aspiration. No significant lymphadenopathy is identified. Lungs/Pleura: Lungs are well aerated bilaterally. Very minimal atelectatic changes are noted right greater than left. No focal parenchymal nodule is seen. No focal confluent infiltrate is identified. Previously seen nodular densities have resolved in the interval. Upper Abdomen: Visualized upper abdomen demonstrates some mild inflammatory changes  surrounding the adrenal glands and celiac axis. A portion of this may be related to the patient's underlying vomiting. CT of the abdomen and pelvis may be helpful for further evaluation. Musculoskeletal: Degenerative changes of the thoracic spine are noted. No acute rib abnormality is seen. Review of the MIP images confirms the above findings. IMPRESSION: No evidence of pulmonary emboli. Mild bibasilar atelectasis. No findings to suggest aspiration pneumonia are noted. Fluid within the esophagus consistent with the patient's given clinical history of reflux. Hazy inflammatory changes surrounding the adrenal glands within the upper abdominal fat. Possibility of underlying pancreatitis would deserve consideration although this may be related to recent vomiting episodes. CT of the abdomen and pelvis and correlation with lab values may be helpful. Aortic Atherosclerosis (ICD10-I70.0). Electronically Signed   By: Alcide CleverMark  Lukens M.D.   On: 02/13/2019 16:11   Dg Chest Portable 1 View  Result Date: 02/13/2019 CLINICAL DATA:  Productive cough and shortness of breath EXAM: PORTABLE CHEST 1 VIEW COMPARISON:  02/13/2019 FINDINGS: Cardiac shadow is within normal limits. Lungs are well aerated bilaterally. No focal infiltrate is seen. Interval clearing in the left retrocardiac region is noted consistent with previous atelectasis. No bony abnormality is noted. IMPRESSION: No active disease.  Electronically Signed   By: Alcide CleverMark  Lukens M.D.   On: 02/13/2019 13:15   Dg Chest Portable 1 View  Result Date: 02/13/2019 CLINICAL DATA:  Cough and fever. EXAM: PORTABLE CHEST 1 VIEW COMPARISON:  November 06, 2018 FINDINGS: The previously noted right lower lobe pneumonia has significantly improved. There is a persistent retrocardiac opacity. There is no pneumothorax. No large pleural effusion. No acute osseous abnormality. IMPRESSION: 1. Significant interval improvement in the right lower lobe pneumonia. 2. Persistent faint retrocardiac opacity which may represent atelectasis or persistent infiltrate. 3. No definite radiographic evidence for recurring aspiration pneumonia. Electronically Signed   By: Katherine Mantlehristopher  Green M.D.   On: 02/13/2019 01:02    Procedures Procedures (including critical care time)  Medications Ordered in ED Medications  sodium chloride 0.9 % bolus 1,000 mL (0 mLs Intravenous Stopped 02/13/19 1405)  cefTRIAXone (ROCEPHIN) 1 g in sodium chloride 0.9 % 100 mL IVPB ( Intravenous Stopped 02/13/19 1408)  azithromycin (ZITHROMAX) 500 mg in sodium chloride 0.9 % 250 mL IVPB ( Intravenous Stopped 02/13/19 1509)  sodium chloride 0.9 % bolus 1,000 mL (0 mLs Intravenous Stopped 02/13/19 1550)  iohexol (OMNIPAQUE) 350 MG/ML injection 100 mL (100 mLs Intravenous Contrast Given 02/13/19 1503)  sodium chloride 0.9 % bolus 1,000 mL (1,000 mLs Intravenous New Bag/Given 02/13/19 1611)     Initial Impression / Assessment and Plan / ED Course  I have reviewed the triage vital signs and the nursing notes.  Pertinent labs & imaging results that were available during my care of the patient were reviewed by me and considered in my medical decision making (see chart for details).        Patient with sepsis of unclear source.  Chest x-ray repeated with no new indication of pneumonia.  He remained hypotensive in the ED and had a positive lactate as well.  He was given IV fluids to help treat his  hypotension.  With complaints of shortness of breath a CT angios was performed to rule out PE or subtle pneumonia.  His CT was negative for either of these 2 conditions.  There was some haziness noted around his adrenals, with his history of nausea and vomiting, LFTs and a lipase have been added, finding is nonspecific.  Still pending  urinalysis, but no urinary complaints at this time.  Patient will need admission for further support, IV fluids.  Remains very drowsy, but awakes easily to voice.  CRITICAL CARE Performed by: Burgess AmorJulie Quintella Mura Total critical care time: 45 minutes Critical care time was exclusive of separately billable procedures and treating other patients. Critical care was necessary to treat or prevent imminent or life-threatening deterioration. Critical care was time spent personally by me on the following activities: development of treatment plan with patient and/or surrogate as well as nursing, discussions with consultants, evaluation of patient's response to treatment, examination of patient, obtaining history from patient or surrogate, ordering and performing treatments and interventions, ordering and review of laboratory studies, ordering and review of radiographic studies, pulse oximetry and re-evaluation of patient's condition.   Discussed with Dr. Allena KatzPatel who will see and admit pt.  Added UDS, pending urinalysis, added lipase and lft's as well.  Final Clinical Impressions(s) / ED Diagnoses   Final diagnoses:  Sepsis without acute organ dysfunction, due to unspecified organism [facility](HCC)    ED Discharge Orders    None       Victoriano Laindol, Shawnelle Spoerl, PA-C 02/13/19 1641    Burgess AmorIdol, Ferris Fielden, PA-C 02/13/19 Larence Penning1835    Goldston, Scott, MD 02/14/19 [phone]

## 2019-02-13 NOTE — ED Notes (Signed)
Updated pt's girlfriend Estill Bamberg with pt's permission [phone]).

## 2019-02-13 NOTE — ED Notes (Signed)
Date and time results received: 02/13/19 1307  Test: Lactic Acid Critical Value: 2.7  Name of Provider Notified: Evalee Jefferson, PA  Orders Received? Or Actions Taken?: See chart

## 2019-02-13 NOTE — Progress Notes (Signed)
Notified attending that per sepsis protocol, pt meets criteria for PCCM to be consulted.  Attending wishes to hold off at this time.

## 2019-02-13 NOTE — ED Notes (Signed)
Pt placed on 4L nasal cannula. Sats increased to 94%. EDP at bedside.

## 2019-02-13 NOTE — ED Notes (Signed)
Ambulated pt with pulse ox on finger.02 started out at 93% on room air,walk around nursing station 02 ended at 91% when pt returned to room

## 2019-02-13 NOTE — ED Notes (Signed)
Patient transported to CT 

## 2019-02-13 NOTE — ED Notes (Signed)
Hospitalist at bedside 

## 2019-02-13 NOTE — ED Notes (Signed)
Systolic pressure in the 07P. EDP notified and is at bedside.

## 2019-02-13 NOTE — ED Notes (Signed)
Pt's girlfriend called. Pt given phone to speak to her.

## 2019-02-14 ENCOUNTER — Inpatient Hospital Stay (HOSPITAL_COMMUNITY): Payer: Self-pay

## 2019-02-14 ENCOUNTER — Ambulatory Visit (INDEPENDENT_AMBULATORY_CARE_PROVIDER_SITE_OTHER): Payer: Medicaid Other | Admitting: Internal Medicine

## 2019-02-14 DIAGNOSIS — G4730 Sleep apnea, unspecified: Secondary | ICD-10-CM

## 2019-02-14 DIAGNOSIS — Z8619 Personal history of other infectious and parasitic diseases: Secondary | ICD-10-CM

## 2019-02-14 LAB — CBC
HCT: 36.5 % — ABNORMAL LOW (ref 39.0–52.0)
Hemoglobin: 11.5 g/dL — ABNORMAL LOW (ref 13.0–17.0)
MCH: 30.3 pg (ref 26.0–34.0)
MCHC: 31.5 g/dL (ref 30.0–36.0)
MCV: 96.3 fL (ref 80.0–100.0)
Platelets: 116 10*3/uL — ABNORMAL LOW (ref 150–400)
RBC: 3.79 MIL/uL — ABNORMAL LOW (ref 4.22–5.81)
RDW: 12.7 % (ref 11.5–15.5)
WBC: 4.9 10*3/uL (ref 4.0–10.5)
nRBC: 0 % (ref 0.0–0.2)

## 2019-02-14 LAB — BASIC METABOLIC PANEL
Anion gap: 5 (ref 5–15)
BUN: 11 mg/dL (ref 6–20)
CO2: 26 mmol/L (ref 22–32)
Calcium: 8 mg/dL — ABNORMAL LOW (ref 8.9–10.3)
Chloride: 112 mmol/L — ABNORMAL HIGH (ref 98–111)
Creatinine, Ser: 0.64 mg/dL (ref 0.61–1.24)
GFR calc Af Amer: >60 mL/min (ref >60)
GFR calc non Af Amer: >60 mL/min (ref >60)
Glucose, Bld: 183 mg/dL — ABNORMAL HIGH (ref 70–99)
Potassium: 4.4 mmol/L (ref 3.5–5.1)
Sodium: 143 mmol/L (ref 135–145)

## 2019-02-14 LAB — CORTISOL-AM, BLOOD: Cortisol - AM: 3.1 ug/dL — ABNORMAL LOW (ref 6.7–22.6)

## 2019-02-14 MED ORDER — IOHEXOL 300 MG/ML  SOLN
30.0000 mL | Freq: Once | INTRAMUSCULAR | Status: AC | PRN
  Administered 2019-02-14: 30 mL via ORAL

## 2019-02-14 MED ORDER — SODIUM CHLORIDE 0.9 % IV SOLN
INTRAVENOUS | Status: AC
  Administered 2019-02-14: 18:00:00 via INTRAVENOUS

## 2019-02-14 MED ORDER — IOPAMIDOL (ISOVUE-300) INJECTION 61%: 100.0000 mL | Freq: Once | INTRAVENOUS | Status: DC | PRN

## 2019-02-14 MED ORDER — IOHEXOL 300 MG/ML  SOLN
100.0000 mL | Freq: Once | INTRAMUSCULAR | Status: AC | PRN
  Administered 2019-02-14: 100 mL via INTRAVENOUS

## 2019-02-14 NOTE — TOC Progression Note (Addendum)
Transition of Care [facility]) - Progression Note    Patient Details  Name: Christian Ellison MRN: [id number] Date of Birth: [birth year]  [facility] CM/SW Contact  Boneta Lucks, RN Phone Number: 02/14/2019, 1:44 PM  Clinical Narrative:   Patient is high risk for readmission, Assessment done, [facility] made Hospital Follow up appointment with [facility]  July 13th at 11:00 added to AVS.     Social Determinants of Health (SDOH) Interventions    Readmission Risk Interventions Readmission Risk Prevention Plan 02/14/2019  Transportation Screening Complete  PCP or Specialist Appt within 3-5 Days Not Complete  HRI or [place] Not Complete  Social Work Consult for [place] Planning/Counseling Complete  Palliative Care Screening Not Complete  Medication Review Press photographer) Complete  Some recent data might be hidden

## 2019-02-14 NOTE — Progress Notes (Signed)
Patient arrives to room 302 via wheelchair from ICU.  Patient independent from chair to side of bed with callbell in reach.

## 2019-02-14 NOTE — Progress Notes (Signed)
PROGRESS NOTE    Christian Ellison  ZOX:[id number] DOB: 02-21-1961 DOA: 02/13/2019 [facility]    Brief Narrative:  As per H&P written by Dr. Allena KatzPatel on 02/13/2019. 58 y.o. male with medical history significant for history of esophageal spasms and stricture/stenosis with history of recurrent aspiration pneumonia, hepatitis C, history of chronic pain and anxiety, hypertension, and hypothyroidism who initially presented to the ED morning of 02/13/2019 with reported nausea and vomiting.  He had a chest x-ray performed which was unrevealing, however there was concern for early aspiration pneumonia which was not showing up on imaging and patient was discharged home with a prescription for oral Augmentin.  Patient returned to the ED several hours later with reported worsening fatigue/lethargy, weakness, and shortness of breath.  He had also reported decreased oral intake and nausea over the last several days which he felt was consistent with his chronic esophageal issues.  On my examination patient just reports generalized fatigue.  He denies subjective fevers, chills, diaphoresis, chest pain, dyspnea, abdominal pain, nausea, vomiting, dysuria, or diarrhea.  He does say that he has been feeling unwell this whole year.  He has been feeling depressed due to the loss of his son earlier in the year.  He states he has been taking Harvoni for his hepatitis C and is almost finished with his course.  He denies any recent medication changes.  Reports a former history of tobacco use, no recent use.  He denies any alcohol use.  He denies any cocaine or IV drug use.  He denies any narcotic/opioid use.  He reports previously taking Xanax for anxiety however says that he has not been taking it for a while as his physician stopped prescribing it.  Assessment & Plan: Hypertension/fatigue/dehydration/generalized weakness -Etiology remains unclear at this time -No signs of acute infection  appreciated on chest x-ray or CT chest. -Concerns including sleep apnea and presumed adrenal insufficiency as cause for patient's symptoms. -Excellent response to the use of empiric IV dexamethasone. -Will follow blood work for adrenal response and titrated/stop steroids if needed -Continue fluid resuscitation -CPAP trial at nighttime -Normal TSH. -Patient is feeling better.  Esophageal dysmotility/stricture -with history of recurring aspiration -No appearance of aspiration on imaging studies at this time -Continue PPI -Discussion about lifestyle modifications provided -Stop antibiotics and follow clinical response.  Mild hyperkalemia -Resolved with fluid resuscitation -Continue to follow electrolytes trend. -No abnormalities appreciated on telemetry or EKG; cardiac monitoring will be discontinued.  Hypothyroidism -TSH within normal limits -Continue Synthroid.  History of hepatitis C -Complete therapy with Harvoni -Continue follow-up with gastroenterology service as an outpatient. -Stable LFTs appreciated.  Anxiety/depression -Currently not receiving any medications -No suicidal ideation or hallucinations at this time.    DVT prophylaxis: Lovenox Code Status: Full Code. Family Communication: no family at bedside  Disposition Plan: Follow pending blood work to rule out adrenal insufficiency component; continue fluid resuscitation, follow lactic acid level and give trial on CPAP at nighttime.  Hopefully discharge on 02/15/2019.  Patient will be transferred to MedSurg.  Consultants:   None  Procedures:   See below for x-ray reports.  Antimicrobials:  Anti-infectives (From admission, onward)   Start     Dose/Rate Route Frequency Ordered Stop   02/13/19 1330  cefTRIAXone (ROCEPHIN) 1 g in sodium chloride 0.9 % 100 mL IVPB     1 g 200 mL/hr over 30 Minutes Intravenous  Once 02/13/19 1324 02/13/19 1408   02/13/19 1330  azithromycin (ZITHROMAX) 500 mg  in sodium chloride  0.9 % 250 mL IVPB     500 mg 250 mL/hr over 60 Minutes Intravenous  Once 02/13/19 1324 02/13/19 1509       Subjective: Improved blood pressure with fluid resuscitation; no chest pain, no nausea, no vomiting.  Lactic acid is still elevated; overall condition improved.  Objective: Vitals:   02/14/19 1139 02/14/19 1300 02/14/19 1400 02/14/19 1610  BP:  119/81    Pulse: 90 78 77 80  Resp: 14 12 12 12   Temp: 98.3 F (36.8 C)   98.6 F (37 C)  TempSrc: Oral   Oral  SpO2: 100% 98% 97% 96%  Weight:      Height:        Intake/Output Summary (Last 24 hours) at 02/14/2019 1619 Last data filed at 02/14/2019 1049 Gross per 24 hour  Intake 4276.92 ml  Output 3350 ml  Net 926.92 ml   Filed Weights   02/13/19 1218 02/13/19 1846 02/14/19 0500  Weight: 83.9 kg 88.2 kg 88.5 kg    Examination: General exam: Alert, awake, oriented x 3; no chest pain, no nausea, no vomiting. Denies SOB. Nursing reported episodes of sleep apnea throughout the night.  Patient expressed feeling weak and tired (symptoms that have been present for the last couple of month). Respiratory system: Clear to auscultation. Respiratory effort normal. Cardiovascular system:RRR. No murmurs, rubs, gallops. Gastrointestinal system: Abdomen is nondistended, soft and nontender. No organomegaly or masses felt. Normal bowel sounds heard. Central nervous system: Alert and oriented. No focal neurological deficits. Extremities: No C/C/E, +pedal pulses Skin: No rashes, lesions or ulcers Psychiatry: Judgement and insight appear normal. Mood & affect appropriate.    Data Reviewed: I have personally reviewed following labs and imaging studies  CBC: Recent Labs  Lab 02/13/19 0117 02/13/19 1246 02/14/19 0507  WBC 8.1 9.3 4.9  NEUTROABS 6.9 7.8*  --   HGB 13.9 13.1 11.5*  HCT 42.2 39.3 36.5*  MCV 91.3 92.0 96.3  PLT 169 174 116*   Basic Metabolic Panel: Recent Labs  Lab 02/13/19 0117 02/13/19 1246 02/14/19 0507  NA 142  140 143  K 4.1 5.2* 4.4  CL 104 102 112*  CO2 29 25 26   GLUCOSE 112* 216* 183*  BUN 13 13 11   CREATININE 0.80 0.98 0.64  CALCIUM 8.9 8.6* 8.0*   GFR: Estimated Creatinine Clearance: 111.8 mL/min (by C-G formula based on SCr of 0.64 mg/dL).   Liver Function Tests: Recent Labs  Lab 02/13/19 1246  AST 39  ALT 29  ALKPHOS 54  BILITOT 0.8  PROT 7.3  ALBUMIN 3.8   Recent Labs  Lab 02/13/19 1246  LIPASE 23   Thyroid Function Tests: Recent Labs    02/13/19 0118  TSH 0.489   Urine analysis:    Component Value Date/Time   COLORURINE YELLOW 02/13/2019 1848   APPEARANCEUR CLEAR 02/13/2019 1848   LABSPEC 1.040 (H) 02/13/2019 1848   PHURINE 5.0 02/13/2019 1848   GLUCOSEU NEGATIVE 02/13/2019 1848   HGBUR NEGATIVE 02/13/2019 1848   BILIRUBINUR NEGATIVE 02/13/2019 1848   KETONESUR NEGATIVE 02/13/2019 1848   PROTEINUR NEGATIVE 02/13/2019 1848   NITRITE NEGATIVE 02/13/2019 1848   LEUKOCYTESUR NEGATIVE 02/13/2019 1848    Recent Results (from the past 240 hour(s))  SARS Coronavirus 2 (CEPHEID- Performed in [facility]), Hosp Order     Status: None   Collection Time: 02/13/19  2:30 AM   Specimen: Nasopharyngeal Swab  Result Value Ref Range Status   SARS Coronavirus  2 NEGATIVE NEGATIVE Final    Comment: (NOTE) If result is NEGATIVE SARS-CoV-2 target nucleic acids are NOT DETECTED. The SARS-CoV-2 RNA is generally detectable in upper and lower  respiratory specimens during the acute phase of infection. The lowest  concentration of SARS-CoV-2 viral copies this assay can detect is 250  copies / mL. A negative result does not preclude SARS-CoV-2 infection  and should not be used as the sole basis for treatment or other  patient management decisions.  A negative result may occur with  improper specimen collection / handling, submission of specimen other  than nasopharyngeal swab, presence of viral mutation(s) within the  areas targeted by this assay, and inadequate  number of viral copies  (<250 copies / mL). A negative result must be combined with clinical  observations, patient history, and epidemiological information. If result is POSITIVE SARS-CoV-2 target nucleic acids are DETECTED. The SARS-CoV-2 RNA is generally detectable in upper and lower  respiratory specimens dur ing the acute phase of infection.  Positive  results are indicative of active infection with SARS-CoV-2.  Clinical  correlation with patient history and other diagnostic information is  necessary to determine patient infection status.  Positive results do  not rule out bacterial infection or co-infection with other viruses. If result is PRESUMPTIVE POSTIVE SARS-CoV-2 nucleic acids MAY BE PRESENT.   A presumptive positive result was obtained on the submitted specimen  and confirmed on repeat testing.  While 2019 novel coronavirus  (SARS-CoV-2) nucleic acids may be present in the submitted sample  additional confirmatory testing may be necessary for epidemiological  and / or clinical management purposes  to differentiate between  SARS-CoV-2 and other Sarbecovirus currently known to infect humans.  If clinically indicated additional testing with an alternate test  methodology [id number]) is advised. The SARS-CoV-2 RNA is generally  detectable in upper and lower respiratory sp ecimens during the acute  phase of infection. The expected result is Negative. Fact Sheet for Patients:  BoilerBrush.com.cyhttps://www.fda.gov/media/136312/download Fact Sheet for Healthcare Providers: https://pope.com/https://www.fda.gov/media/136313/download This test is not yet approved or cleared by the [place] FDA and has been authorized for detection and/or diagnosis of SARS-CoV-2 by FDA under an Emergency Use Authorization (EUA).  This EUA will remain in effect (meaning this test can be used) for the duration of the COVID-19 declaration under Section 564(b)(1) of the Act, 21 U.S.C. section 360bbb-3(b)(1), unless the  authorization is terminated or revoked sooner. Performed at [facility], [address] [place], [place] [address]   Blood culture (routine x 2)     Status: None (Preliminary result)   Collection Time: 02/13/19 12:46 PM   Specimen: BLOOD RIGHT HAND  Result Value Ref Range Status   Specimen Description BLOOD RIGHT HAND  Final   Special Requests   Final    BOTTLES DRAWN AEROBIC AND ANAEROBIC Blood Culture adequate volume   Culture   Final    NO GROWTH < 24 HOURS Performed at [facility], [address] [place], [place] [address]    Report Status PENDING  Incomplete  Blood culture (routine x 2)     Status: None (Preliminary result)   Collection Time: 02/13/19 12:59 PM   Specimen: Left Antecubital; Blood  Result Value Ref Range Status   Specimen Description LEFT ANTECUBITAL  Final   Special Requests   Final    BOTTLES DRAWN AEROBIC AND ANAEROBIC Blood Culture adequate volume   Culture   Final    NO GROWTH < 24 HOURS Performed at [facility], [address] [place], [place] [address]    Report Status PENDING  Incomplete  MRSA PCR Screening     Status: Abnormal   Collection Time: 02/13/19  6:36 PM   Specimen: Nasopharyngeal  Result Value Ref Range Status   MRSA by PCR POSITIVE (A) NEGATIVE Final    Comment:        The GeneXpert MRSA Assay (FDA approved for NASAL specimens only), is one component of a comprehensive MRSA colonization surveillance program. It is not intended to diagnose MRSA infection nor to guide or monitor treatment for MRSA infections. RESULT CALLED TO, READ BACK BY AND VERIFIED WITH: J SMITH,RN @2301  02/13/19 [facility] Performed at [facility], [address] [place], [place] [address]      Radiology Studies: Ct Angio Chest Pe W And/or Wo Contrast  Result Date: 02/13/2019 CLINICAL DATA:  Dry cough and shortness of breath EXAM: CT ANGIOGRAPHY CHEST WITH CONTRAST TECHNIQUE: Multidetector CT imaging of the chest was performed using the standard protocol  during bolus administration of intravenous contrast. Multiplanar CT image reconstructions and MIPs were obtained to evaluate the vascular anatomy. CONTRAST:  173mL OMNIPAQUE 350 COMPARISON:  Chest x-ray from earlier in the same day, CT from 06/09/2018. FINDINGS: Cardiovascular: Thoracic aorta is within normal limits with only minimal atherosclerotic calcifications identified. No coronary calcifications are seen. The pulmonary artery shows a normal branching pattern without focal filling defect to suggest pulmonary embolism. Mediastinum/Nodes: Thoracic inlet is within normal limits. The esophagus demonstrates fluid within which would correspond with the patient's given clinical history of reflux and aspiration. No significant lymphadenopathy is identified. Lungs/Pleura: Lungs are well aerated bilaterally. Very minimal atelectatic changes are noted right greater than left. No focal parenchymal nodule is seen. No focal confluent infiltrate is identified. Previously seen nodular densities have resolved in the interval. Upper Abdomen: Visualized upper abdomen demonstrates some mild inflammatory changes surrounding the adrenal glands and celiac axis. A portion of this may be related to the patient's underlying vomiting. CT of the abdomen and pelvis may be helpful for further evaluation. Musculoskeletal: Degenerative changes of the thoracic spine are noted. No acute rib abnormality is seen. Review of the MIP images confirms the above findings. IMPRESSION: No evidence of pulmonary emboli. Mild bibasilar atelectasis. No findings to suggest aspiration pneumonia are noted. Fluid within the esophagus consistent with the patient's given clinical history of reflux. Hazy inflammatory changes surrounding the adrenal glands within the upper abdominal fat. Possibility of underlying pancreatitis would deserve consideration although this may be related to recent vomiting episodes. CT of the abdomen and pelvis and correlation with lab  values may be helpful. Aortic Atherosclerosis (ICD10-I70.0). Electronically Signed   By: Inez Catalina M.D.   On: 02/13/2019 16:11   Ct Abdomen Pelvis W Contrast  Result Date: 02/14/2019 CLINICAL DATA:  Vomiting.  Abnormal CT scan. EXAM: CT ABDOMEN AND PELVIS WITH CONTRAST TECHNIQUE: Multidetector CT imaging of the abdomen and pelvis was performed using the standard protocol following bolus administration of intravenous contrast. CONTRAST:  66mL OMNIPAQUE IOHEXOL 300 MG/ML SOLN, 141mL OMNIPAQUE IOHEXOL 300 MG/ML SOLN COMPARISON:  CT scan of February 13, 2019. FINDINGS: Lower chest: No acute abnormality. Hepatobiliary: No cholelithiasis or biliary dilatation is noted. The liver is unremarkable. There does appear to be significant gallbladder wall thickening present. Pancreas: Unremarkable. No pancreatic ductal dilatation or surrounding inflammatory changes. Spleen: Normal in size without focal abnormality. Adrenals/Urinary Tract: Adrenal glands are unremarkable. Kidneys are normal except for minimal perirenal stranding bilaterally, without renal calculi, focal  lesion, or hydronephrosis. Bladder is unremarkable. Stomach/Bowel: Stomach is within normal limits. Appendix appears normal. No evidence of bowel wall thickening, distention, or inflammatory changes. Vascular/Lymphatic: No significant vascular findings are present. No enlarged abdominal or pelvic lymph nodes. Reproductive: Prostate is unremarkable. Other: No abdominal wall hernia or abnormality. Minimal amount of free fluid is noted in the pelvis. Musculoskeletal: No acute or significant osseous findings. IMPRESSION: Moderate gallbladder wall thickening is noted concerning for possible cholecystitis. No cholelithiasis is noted. Ultrasound is recommended for further evaluation. Minimal bilateral perirenal stranding is noted of uncertain significance. No hydronephrosis or renal obstruction is noted. Minimal amount of free fluid is noted in the pelvis.  Electronically Signed   By: Lupita Raider M.D.   On: 02/14/2019 12:33   Dg Chest Portable 1 View  Result Date: 02/13/2019 CLINICAL DATA:  Productive cough and shortness of breath EXAM: PORTABLE CHEST 1 VIEW COMPARISON:  02/13/2019 FINDINGS: Cardiac shadow is within normal limits. Lungs are well aerated bilaterally. No focal infiltrate is seen. Interval clearing in the left retrocardiac region is noted consistent with previous atelectasis. No bony abnormality is noted. IMPRESSION: No active disease. Electronically Signed   By: Alcide Clever M.D.   On: 02/13/2019 13:15   Dg Chest Portable 1 View  Result Date: 02/13/2019 CLINICAL DATA:  Cough and fever. EXAM: PORTABLE CHEST 1 VIEW COMPARISON:  November 06, 2018 FINDINGS: The previously noted right lower lobe pneumonia has significantly improved. There is a persistent retrocardiac opacity. There is no pneumothorax. No large pleural effusion. No acute osseous abnormality. IMPRESSION: 1. Significant interval improvement in the right lower lobe pneumonia. 2. Persistent faint retrocardiac opacity which may represent atelectasis or persistent infiltrate. 3. No definite radiographic evidence for recurring aspiration pneumonia. Electronically Signed   By: Katherine Mantle M.D.   On: 02/13/2019 01:02    Scheduled Meds:  Chlorhexidine Gluconate Cloth  6 each Topical Daily   Chlorhexidine Gluconate Cloth  6 each Topical Q0600   dexamethasone  4 mg Intravenous Q12H   enoxaparin (LOVENOX) injection  40 mg Subcutaneous Q24H   levothyroxine  25 mcg Oral Q0600   mouth rinse  15 mL Mouth Rinse BID   mupirocin ointment  1 application Nasal BID   pantoprazole  40 mg Oral BID   sodium chloride flush  3 mL Intravenous Q12H   Continuous Infusions:  sodium chloride       LOS: 1 day    Time spent: 30 minutes.   Vassie Loll, MD [facility] Pager [phone]  02/14/2019, 4:19 PM

## 2019-02-14 NOTE — TOC Initial Note (Addendum)
Transition of Care [facility]) - Initial/Assessment Note    Patient Details  Name: Christian Ellison MRN: [id number] Date of Birth: April 16, 1961  [facility] CM/SW Contact:    Boneta Lucks, RN Phone Number: 02/14/2019, 1:36 PM  Clinical Narrative:       Patient admitted for  Hypotension. Patient has no Insurance, Pcp listed as [employer].  Per the office patient has not been seen in one year. Per patient he has had phone visits with [facility]. Per MD patient is needs an out patient sleep study.  CM called left Tito Dine for Financial counseling and possible Medicaid application for patient.  Called Ashly with [employer], He will review chart and call back tomorrow with the best plan for self paying patients.          Barriers to Discharge: Continued Medical Work up, Other (comment)(no insurance)               Prior Living Arrangements/Services     Patient language and need for interpreter reviewed:: Yes Do you feel safe going back to the place where you live?: Yes            Criminal Activity/Legal Involvement Pertinent to Current Situation/Hospitalization: No - Comment as needed  Activities of Daily Living Home Assistive Devices/Equipment: None ADL Screening (condition at time of admission) Patient's cognitive ability adequate to safely complete daily activities?: Yes Is the patient deaf or have difficulty hearing?: No Does the patient have difficulty seeing, even when wearing glasses/contacts?: No Does the patient have difficulty concentrating, remembering, or making decisions?: No Patient able to express need for assistance with ADLs?: Yes Does the patient have difficulty dressing or bathing?: No Independently performs ADLs?: Yes (appropriate for developmental age) Does the patient have difficulty walking or climbing stairs?: No Weakness of Legs: None Weakness of Arms/Hands: None  Permission Sought/Granted Permission sought to share  information with : Case Manager Permission granted to share information with : Yes, Verbal Permission Granted     Permission granted to share info w AGENCY: Home sleep study groups        Emotional Assessment     Affect (typically observed): Accepting Orientation: : Oriented to Self, Oriented to Place, Oriented to  Time, Oriented to Situation Alcohol / Substance Use: Not Applicable Psych Involvement: No (comment)  Admission diagnosis:  Sepsis without acute organ dysfunction, due to unspecified organism ([place]) [A41.9] Hypotension [I95.9] Patient Active Problem List   Diagnosis Date Noted  . Bronchopneumonia 11/06/2018  . Acute Respiratory failure with hypoxia (2/2 Asp PNA) 11/06/2018  . Recurrent aspiration pneumonia ([place])   . Fever 06/09/2018  . Tachycardia 06/09/2018  . Hypoxia 06/09/2018  . Hypotension 06/09/2018  . Esophageal dysmotility/Esophageal stricture/stenosis/esophageal dysmotility with recurrent aspiration   . Aspiration pneumonia of right lower lobe ([place])   . Sepsis ([place]) 05/26/2018  . Pulmonary edema 02/02/2018  . HCAP (healthcare-associated pneumonia) 02/02/2018  . Weakness generalized 02/02/2018  . Hypertension 02/02/2018  . CHF (congestive heart failure) ([place]) 02/02/2018  . Nausea & vomiting 02/02/2018  . Dysphagia 02/02/2018  . Esophageal stricture/stenosis/esophageal dysmotility with recurrent aspiration 02/02/2018  . Esophageal stenosis 02/02/2018  . GERD (gastroesophageal reflux disease) 02/02/2018  . Polysubstance abuse ([place]) 02/02/2018  . Acute respiratory failure with hypoxia ([place])   . Transaminitis   . Elevated troponin   . Overdose 01/02/2018  . Hx of hepatitis C 01/17/2013  . Depression 01/17/2013  . Anxiety with depression 01/17/2013  . Right knee pain 01/17/2013  [facility]:  [facility]:   [facility] [address] - [place], [place] - [address] [place] [address] Phone: [phone] Fax:  [phone]         Readmission Risk Interventions No flowsheet data found.

## 2019-02-14 NOTE — Progress Notes (Signed)
Patient is refusing the use of CPAP for tonight. RT informed patient if he changes his mind have RN contact RT. 

## 2019-02-15 ENCOUNTER — Inpatient Hospital Stay (HOSPITAL_COMMUNITY): Payer: Self-pay

## 2019-02-15 DIAGNOSIS — I9589 Other hypotension: Secondary | ICD-10-CM

## 2019-02-15 DIAGNOSIS — F418 Other specified anxiety disorders: Secondary | ICD-10-CM

## 2019-02-15 DIAGNOSIS — K224 Dyskinesia of esophagus: Secondary | ICD-10-CM

## 2019-02-15 DIAGNOSIS — R531 Weakness: Secondary | ICD-10-CM

## 2019-02-15 LAB — BASIC METABOLIC PANEL
Anion gap: 8 (ref 5–15)
BUN: 14 mg/dL (ref 6–20)
CO2: 28 mmol/L (ref 22–32)
Calcium: 8.7 mg/dL — ABNORMAL LOW (ref 8.9–10.3)
Chloride: 104 mmol/L (ref 98–111)
Creatinine, Ser: 0.65 mg/dL (ref 0.61–1.24)
GFR calc Af Amer: >60 mL/min (ref >60)
GFR calc non Af Amer: >60 mL/min (ref >60)
Glucose, Bld: 154 mg/dL — ABNORMAL HIGH (ref 70–99)
Potassium: 4.2 mmol/L (ref 3.5–5.1)
Sodium: 140 mmol/L (ref 135–145)

## 2019-02-15 MED ORDER — DEXAMETHASONE 4 MG PO TABS: 2.0000 mg | ORAL_TABLET | Freq: Two times a day (BID) | ORAL | Status: DC

## 2019-02-15 MED ORDER — DEXAMETHASONE 1 MG PO TABS: 2.0000 mg | ORAL_TABLET | Freq: Two times a day (BID) | ORAL | 0 refills | Status: DC

## 2019-02-15 NOTE — Progress Notes (Signed)
IV removed and discharge instructions reviewed.  Family to give ride home. Script given

## 2019-02-15 NOTE — Discharge Summary (Signed)
Physician Discharge Summary  Christian MealsJohn G Ellison JXB:[id number] DOB: [birth year] DOA: 02/13/2019  [facility]P: [facility]  Admit date: 02/13/2019 Discharge date: 02/15/2019  Admitted From: Home Disposition:  Home  Recommendations for Outpatient Follow-up:  1. Follow up with PCP in 1-2 weeks 2. Please obtain BMP/CBC in one week    Discharge Condition: Stable CODE STATUS: FULL Diet recommendation: Heart Healthy   Brief/Interim Summary: 58 y.o.malewith medical history significant forhistory of esophageal spasms and stricture/stenosis with history of recurrent aspiration pneumonia, hepatitis C, history of chronic pain and anxiety, hypertension, and hypothyroidism who initially presented to the ED morning of 6/29/2020with reported nausea and vomiting.He had a chest x-ray performed which was unrevealing, however there was concern for early aspiration pneumonia which was not showing up on imaging and patient was discharged home with a prescription for oral Augmentin.  Patient returned to the ED several hours laterwith reported worsening fatigue/lethargy, weakness, and shortness of breath. He had also reported decreased oral intake and nausea over the last several days which he felt was consistent with his chronic esophageal issues.  On my examination patient just reports generalized fatigue. He denies subjective fevers, chills, diaphoresis, chest pain, dyspnea, abdominal pain, nausea, vomiting, dysuria, or diarrhea. He does say that he has been feeling unwell this whole year. He has been feeling depressed due to the loss of his son earlier in the year. He states he has been taking Harvoni for his hepatitis C and is almost finished with his course. He denies any recent medication changes. Reports a former history of tobacco use, no recent use. He denies any alcohol use. He denies any cocaine or IV drug use. He denies any narcotic/opioid use.   Discharge  Diagnoses:  Hypotension/fatigue/dehydration/generalized weakness -due to primary adrenal insufficiency -No signs of acute infection appreciated on chest x-ray or CT chest. -blood cultures neg -UA neg for pyuria -CT abd showed GB wall thickening, but RUQ ultrasound showed only ULN GB wall thickening without Murphy's sign -no abd pain on exam -Excellent response to the use of empiric IV dexamethasone. -Continue fluid resuscitation -CPAP trial at nighttime--pt refused -Normal TSH. -Patient is feeling better--remained afebrile and BP improved back to baseline HTN -am cortisol 3.1 -pt did not want to stay in the hospital any longer for cortrosyn stimulation test; therefore, plan to treat patient empirically for adrenal insufficiency with dexamethasone taper after d/c-->2 mg bid x 1 week, then 1 mg bid x 1 week, then 1 mg daily x 1 week  Esophageal dysmotility/stricture -with history of recurring aspiration -No appearance of aspiration on imaging studies at this time -Continue PPI -Discussion about lifestyle modifications provided -Stop antibiotics and follow clinical response--pt remained stable -diet was advanced to cardiac diet which he tolerated without any dysphagia or odynophagia -I offered pt GI consult with Dr. Karilyn Cotaehman, but pt did want to wait in the hospital any longer; he preferred to follow up outpt since he was able to tolerated cardiac diet  Mild hyperkalemia -Resolved with fluid resuscitation -Continue to follow electrolytes trend. -No abnormalities appreciated on telemetry or EKG; cardiac monitoring will be discontinued.  Hypothyroidism -TSH within normal limits -Continue Synthroid.  History of hepatitis C -Complete therapy with Harvoni -Continue follow-up with gastroenterology service as an outpatient. -Stable LFTs appreciated.  Anxiety/depression -Currently not receiving any medications -No suicidal ideation or hallucinations at this  time.      Discharge Instructions   Allergies as of 02/15/2019   No Known Allergies     Medication List  TAKE these medications   albuterol 108 (90 Base) MCG/ACT inhaler Commonly known as: VENTOLIN HFA Inhale 2 puffs into the lungs every 6 (six) hours as needed for wheezing or shortness of breath.   ALPRAZolam 0.5 MG tablet Commonly known as: XANAX Take 0.5 mg by mouth 2 (two) times daily.   amoxicillin-clavulanate 875-125 MG tablet Commonly known as: AUGMENTIN Take 1 tablet by mouth every 12 (twelve) hours.   aspirin EC 81 MG tablet Take 81 mg by mouth daily.   atenolol 25 MG tablet Commonly known as: TENORMIN Take 25 mg by mouth daily.   dexamethasone 1 MG tablet Commonly known as: DECADRON Take 2 tablets (2 mg total) by mouth every 12 (twelve) hours. X 7 days.  Then 1 tablet (1 mg) every 12 hours x 7 days.  Then 1 tablet (1 mg) once daily x 7 days.   levothyroxine 25 MCG tablet Commonly known as: SYNTHROID Take 25 mcg by mouth daily.   multivitamin with minerals Tabs tablet Take 1 tablet by mouth daily. 50+   ondansetron 4 MG disintegrating tablet Commonly known as: Zofran ODT Take 1 tablet (4 mg total) by mouth every 8 (eight) hours as needed for nausea or vomiting.   pantoprazole 40 MG tablet Commonly known as: PROTONIX Take 1 tablet (40 mg total) by mouth 2 (two) times daily.      Follow-up Information    Dekoninck, Beth A, NP. Go to.   Specialty: General Practice Why: Dr Marlon Pel with see you in the office for Hospital Follow- up July 13th  11:00 Contact information: [address], [place] [address] [phone]          No Known Allergies  Consultations:  none   Procedures/Studies: Ct Angio Chest Pe W And/or Wo Contrast  Result Date: 02/13/2019 CLINICAL DATA:  Dry cough and shortness of breath EXAM: CT ANGIOGRAPHY CHEST WITH CONTRAST TECHNIQUE: Multidetector CT imaging of the chest was performed using the standard  protocol during bolus administration of intravenous contrast. Multiplanar CT image reconstructions and MIPs were obtained to evaluate the vascular anatomy. CONTRAST:  114mL OMNIPAQUE 350 COMPARISON:  Chest x-ray from earlier in the same day, CT from 06/09/2018. FINDINGS: Cardiovascular: Thoracic aorta is within normal limits with only minimal atherosclerotic calcifications identified. No coronary calcifications are seen. The pulmonary artery shows a normal branching pattern without focal filling defect to suggest pulmonary embolism. Mediastinum/Nodes: Thoracic inlet is within normal limits. The esophagus demonstrates fluid within which would correspond with the patient's given clinical history of reflux and aspiration. No significant lymphadenopathy is identified. Lungs/Pleura: Lungs are well aerated bilaterally. Very minimal atelectatic changes are noted right greater than left. No focal parenchymal nodule is seen. No focal confluent infiltrate is identified. Previously seen nodular densities have resolved in the interval. Upper Abdomen: Visualized upper abdomen demonstrates some mild inflammatory changes surrounding the adrenal glands and celiac axis. A portion of this may be related to the patient's underlying vomiting. CT of the abdomen and pelvis may be helpful for further evaluation. Musculoskeletal: Degenerative changes of the thoracic spine are noted. No acute rib abnormality is seen. Review of the MIP images confirms the above findings. IMPRESSION: No evidence of pulmonary emboli. Mild bibasilar atelectasis. No findings to suggest aspiration pneumonia are noted. Fluid within the esophagus consistent with the patient's given clinical history of reflux. Hazy inflammatory changes surrounding the adrenal glands within the upper abdominal fat. Possibility of underlying pancreatitis would deserve consideration although this may be related to recent  vomiting episodes. CT of the abdomen and pelvis and correlation  with lab values may be helpful. Aortic Atherosclerosis (ICD10-I70.0). Electronically Signed   By: Alcide Clever M.D.   On: 02/13/2019 16:11   Ct Abdomen Pelvis W Contrast  Result Date: 02/14/2019 CLINICAL DATA:  Vomiting.  Abnormal CT scan. EXAM: CT ABDOMEN AND PELVIS WITH CONTRAST TECHNIQUE: Multidetector CT imaging of the abdomen and pelvis was performed using the standard protocol following bolus administration of intravenous contrast. CONTRAST:  30mL OMNIPAQUE IOHEXOL 300 MG/ML SOLN, OMNIPAQUE IOHEXOL 300 MG/ML SOLN COMPARISON:  CT scan of February 13, 2019. FINDINGS: Lower chest: No acute abnormality. Hepatobiliary: No cholelithiasis or biliary dilatation is noted. The liver is unremarkable. There does appear to be significant gallbladder wall thickening present. Pancreas: Unremarkable. No pancreatic ductal dilatation or surrounding inflammatory changes. Spleen: Normal in size without focal abnormality. Adrenals/Urinary Tract: Adrenal glands are unremarkable. Kidneys are normal except for minimal perirenal stranding bilaterally, without renal calculi, focal lesion, or hydronephrosis. Bladder is unremarkable. Stomach/Bowel: Stomach is within normal limits. Appendix appears normal. No evidence of bowel wall thickening, distention, or inflammatory changes. Vascular/Lymphatic: No significant vascular findings are present. No enlarged abdominal or pelvic lymph nodes. Reproductive: Prostate is unremarkable. Other: No abdominal wall hernia or abnormality. Minimal amount of free fluid is noted in the pelvis. Musculoskeletal: No acute or significant osseous findings. IMPRESSION: Moderate gallbladder wall thickening is noted concerning for possible cholecystitis. No cholelithiasis is noted. Ultrasound is recommended for further evaluation. Minimal bilateral perirenal stranding is noted of uncertain significance. No hydronephrosis or renal obstruction is noted. Minimal amount of free fluid is noted in the pelvis.  Electronically Signed   By: Lupita Raider M.D.   On: 02/14/2019 12:33   Dg Chest Portable 1 View  Result Date: 02/13/2019 CLINICAL DATA:  Productive cough and shortness of breath EXAM: PORTABLE CHEST 1 VIEW COMPARISON:  02/13/2019 FINDINGS: Cardiac shadow is within normal limits. Lungs are well aerated bilaterally. No focal infiltrate is seen. Interval clearing in the left retrocardiac region is noted consistent with previous atelectasis. No bony abnormality is noted. IMPRESSION: No active disease. Electronically Signed   By: Alcide Clever M.D.   On: 02/13/2019 13:15   Dg Chest Portable 1 View  Result Date: 02/13/2019 CLINICAL DATA:  Cough and fever. EXAM: PORTABLE CHEST 1 VIEW COMPARISON:  November 06, 2018 FINDINGS: The previously noted right lower lobe pneumonia has significantly improved. There is a persistent retrocardiac opacity. There is no pneumothorax. No large pleural effusion. No acute osseous abnormality. IMPRESSION: 1. Significant interval improvement in the right lower lobe pneumonia. 2. Persistent faint retrocardiac opacity which may represent atelectasis or persistent infiltrate. 3. No definite radiographic evidence for recurring aspiration pneumonia. Electronically Signed   By: Katherine Mantle M.D.   On: 02/13/2019 01:02   US Abdomen Limited Ruq  Result Date: 02/15/2019 CLINICAL DATA:  Abdominal pain, hepatitis-C virus with treatment EXAM: ULTRASOUND ABDOMEN LIMITED RIGHT UPPER QUADRANT COMPARISON:  CT abdomen and pelvis 02/14/2019 FINDINGS: Gallbladder: Normally distended without stones. Upper normal wall thickness. No pericholecystic fluid or sonographic Murphy sign. Common bile duct: Diameter: Normal caliber Liver: Echogenic parenchyma, likely fatty infiltration though this can be seen with cirrhosis and certain infiltrative disorders. No focal hepatic mass or nodularity. Portal vein is patent on color Doppler imaging with normal direction of blood flow towards the liver. No RIGHT  upper quadrant free fluid. IMPRESSION: Upper normal gallbladder wall thickness without gallstones or sonographic Murphy sign, nonspecific. Otherwise normal exam. Electronically  Signed   By: Ulyses SouthwardMark  Boles M.D.   On: 02/15/2019 09:34         Discharge Exam: Vitals:   02/15/19 0758 02/15/19 1149  BP: 138/90 (!) 155/94  Pulse: 74 65  Resp: 18 18  Temp: 98.4 F (36.9 C) 98.6 F (37 C)  SpO2: 96% 98%   Vitals:   02/15/19 0500 02/15/19 0525 02/15/19 0758 02/15/19 1149  BP:  (!) 145/86 138/90 (!) 155/94  Pulse:  80 74 65  Resp:  20 18 18   Temp:  98.1 F (36.7 C) 98.4 F (36.9 C) 98.6 F (37 C)  TempSrc:  Oral Oral Oral  SpO2:  97% 96% 98%  Weight: 94.3 kg     Height:        General: Pt is alert, awake, not in acute distress Cardiovascular: RRR, S1/S2 +, no rubs, no gallops Respiratory: CTA bilaterally, no wheezing, no rhonchi Abdominal: Soft, NT, ND, bowel sounds + Extremities: no edema, no cyanosis   The results of significant diagnostics from this hospitalization (including imaging, microbiology, ancillary and laboratory) are listed below for reference.    Significant Diagnostic Studies: Ct Angio Chest Pe W And/or Wo Contrast  Result Date: 02/13/2019 CLINICAL DATA:  Dry cough and shortness of breath EXAM: CT ANGIOGRAPHY CHEST WITH CONTRAST TECHNIQUE: Multidetector CT imaging of the chest was performed using the standard protocol during bolus administration of intravenous contrast. Multiplanar CT image reconstructions and MIPs were obtained to evaluate the vascular anatomy. CONTRAST:  100mL OMNIPAQUE 350 COMPARISON:  Chest x-ray from earlier in the same day, CT from 06/09/2018. FINDINGS: Cardiovascular: Thoracic aorta is within normal limits with only minimal atherosclerotic calcifications identified. No coronary calcifications are seen. The pulmonary artery shows a normal branching pattern without focal filling defect to suggest pulmonary embolism. Mediastinum/Nodes: Thoracic  inlet is within normal limits. The esophagus demonstrates fluid within which would correspond with the patient's given clinical history of reflux and aspiration. No significant lymphadenopathy is identified. Lungs/Pleura: Lungs are well aerated bilaterally. Very minimal atelectatic changes are noted right greater than left. No focal parenchymal nodule is seen. No focal confluent infiltrate is identified. Previously seen nodular densities have resolved in the interval. Upper Abdomen: Visualized upper abdomen demonstrates some mild inflammatory changes surrounding the adrenal glands and celiac axis. A portion of this may be related to the patient's underlying vomiting. CT of the abdomen and pelvis may be helpful for further evaluation. Musculoskeletal: Degenerative changes of the thoracic spine are noted. No acute rib abnormality is seen. Review of the MIP images confirms the above findings. IMPRESSION: No evidence of pulmonary emboli. Mild bibasilar atelectasis. No findings to suggest aspiration pneumonia are noted. Fluid within the esophagus consistent with the patient's given clinical history of reflux. Hazy inflammatory changes surrounding the adrenal glands within the upper abdominal fat. Possibility of underlying pancreatitis would deserve consideration although this may be related to recent vomiting episodes. CT of the abdomen and pelvis and correlation with lab values may be helpful. Aortic Atherosclerosis (ICD10-I70.0). Electronically Signed   By: Alcide CleverMark  Lukens M.D.   On: 02/13/2019 16:11   Ct Abdomen Pelvis W Contrast  Result Date: 02/14/2019 CLINICAL DATA:  Vomiting.  Abnormal CT scan. EXAM: CT ABDOMEN AND PELVIS WITH CONTRAST TECHNIQUE: Multidetector CT imaging of the abdomen and pelvis was performed using the standard protocol following bolus administration of intravenous contrast. CONTRAST:  30mL OMNIPAQUE IOHEXOL 300 MG/ML SOLN, 100mL OMNIPAQUE IOHEXOL 300 MG/ML SOLN COMPARISON:  CT scan of February 13, 2019.  FINDINGS: Lower chest: No acute abnormality. Hepatobiliary: No cholelithiasis or biliary dilatation is noted. The liver is unremarkable. There does appear to be significant gallbladder wall thickening present. Pancreas: Unremarkable. No pancreatic ductal dilatation or surrounding inflammatory changes. Spleen: Normal in size without focal abnormality. Adrenals/Urinary Tract: Adrenal glands are unremarkable. Kidneys are normal except for minimal perirenal stranding bilaterally, without renal calculi, focal lesion, or hydronephrosis. Bladder is unremarkable. Stomach/Bowel: Stomach is within normal limits. Appendix appears normal. No evidence of bowel wall thickening, distention, or inflammatory changes. Vascular/Lymphatic: No significant vascular findings are present. No enlarged abdominal or pelvic lymph nodes. Reproductive: Prostate is unremarkable. Other: No abdominal wall hernia or abnormality. Minimal amount of free fluid is noted in the pelvis. Musculoskeletal: No acute or significant osseous findings. IMPRESSION: Moderate gallbladder wall thickening is noted concerning for possible cholecystitis. No cholelithiasis is noted. Ultrasound is recommended for further evaluation. Minimal bilateral perirenal stranding is noted of uncertain significance. No hydronephrosis or renal obstruction is noted. Minimal amount of free fluid is noted in the pelvis. Electronically Signed   By: Lupita Raider M.D.   On: 02/14/2019 12:33   Dg Chest Portable 1 View  Result Date: 02/13/2019 CLINICAL DATA:  Productive cough and shortness of breath EXAM: PORTABLE CHEST 1 VIEW COMPARISON:  02/13/2019 FINDINGS: Cardiac shadow is within normal limits. Lungs are well aerated bilaterally. No focal infiltrate is seen. Interval clearing in the left retrocardiac region is noted consistent with previous atelectasis. No bony abnormality is noted. IMPRESSION: No active disease. Electronically Signed   By: Alcide Clever M.D.   On:  02/13/2019 13:15   Dg Chest Portable 1 View  Result Date: 02/13/2019 CLINICAL DATA:  Cough and fever. EXAM: PORTABLE CHEST 1 VIEW COMPARISON:  November 06, 2018 FINDINGS: The previously noted right lower lobe pneumonia has significantly improved. There is a persistent retrocardiac opacity. There is no pneumothorax. No large pleural effusion. No acute osseous abnormality. IMPRESSION: 1. Significant interval improvement in the right lower lobe pneumonia. 2. Persistent faint retrocardiac opacity which may represent atelectasis or persistent infiltrate. 3. No definite radiographic evidence for recurring aspiration pneumonia. Electronically Signed   By: Katherine Mantle M.D.   On: 02/13/2019 01:02   US Abdomen Limited Ruq  Result Date: 02/15/2019 CLINICAL DATA:  Abdominal pain, hepatitis-C virus with treatment EXAM: ULTRASOUND ABDOMEN LIMITED RIGHT UPPER QUADRANT COMPARISON:  CT abdomen and pelvis 02/14/2019 FINDINGS: Gallbladder: Normally distended without stones. Upper normal wall thickness. No pericholecystic fluid or sonographic Murphy sign. Common bile duct: Diameter: Normal caliber Liver: Echogenic parenchyma, likely fatty infiltration though this can be seen with cirrhosis and certain infiltrative disorders. No focal hepatic mass or nodularity. Portal vein is patent on color Doppler imaging with normal direction of blood flow towards the liver. No RIGHT upper quadrant free fluid. IMPRESSION: Upper normal gallbladder wall thickness without gallstones or sonographic Murphy sign, nonspecific. Otherwise normal exam. Electronically Signed   By: Ulyses Southward M.D.   On: 02/15/2019 09:34     Microbiology: Recent Results (from the past 240 hour(s))  SARS Coronavirus 2 (CEPHEID- Performed in [facility] hospital lab), Hosp Order     Status: None   Collection Time: 02/13/19  2:30 AM   Specimen: Nasopharyngeal Swab  Result Value Ref Range Status   SARS Coronavirus 2 NEGATIVE NEGATIVE Final    Comment:  (NOTE) If result is NEGATIVE SARS-CoV-2 target nucleic acids are NOT DETECTED. The SARS-CoV-2 RNA is generally detectable in upper and lower  respiratory specimens during the acute  phase of infection. The lowest  concentration of SARS-CoV-2 viral copies this assay can detect is 250  copies / mL. A negative result does not preclude SARS-CoV-2 infection  and should not be used as the sole basis for treatment or other  patient management decisions.  A negative result may occur with  improper specimen collection / handling, submission of specimen other  than nasopharyngeal swab, presence of viral mutation(s) within the  areas targeted by this assay, and inadequate number of viral copies  (<250 copies / mL). A negative result must be combined with clinical  observations, patient history, and epidemiological information. If result is POSITIVE SARS-CoV-2 target nucleic acids are DETECTED. The SARS-CoV-2 RNA is generally detectable in upper and lower  respiratory specimens dur ing the acute phase of infection.  Positive  results are indicative of active infection with SARS-CoV-2.  Clinical  correlation with patient history and other diagnostic information is  necessary to determine patient infection status.  Positive results do  not rule out bacterial infection or co-infection with other viruses. If result is PRESUMPTIVE POSTIVE SARS-CoV-2 nucleic acids MAY BE PRESENT.   A presumptive positive result was obtained on the submitted specimen  and confirmed on repeat testing.  While 2019 novel coronavirus  (SARS-CoV-2) nucleic acids may be present in the submitted sample  additional confirmatory testing may be necessary for epidemiological  and / or clinical management purposes  to differentiate between  SARS-CoV-2 and other Sarbecovirus currently known to infect humans.  If clinically indicated additional testing with an alternate test  methodology [phone]([id number]) is advised. The SARS-CoV-2 RNA is  generally  detectable in upper and lower respiratory sp ecimens during the acute  phase of infection. The expected result is Negative. Fact Sheet for Patients:  BoilerBrush.com.cyhttps://www.fda.gov/media/136312/download Fact Sheet for Healthcare Providers: https://pope.com/https://www.fda.gov/media/136313/download This test is not yet approved or cleared by the [place] FDA and has been authorized for detection and/or diagnosis of SARS-CoV-2 by FDA under an Emergency Use Authorization (EUA).  This EUA will remain in effect (meaning this test can be used) for the duration of the COVID-19 declaration under Section 564(b)(1) of the Act, 21 U.S.C. section 360bbb-3(b)(1), unless the authorization is terminated or revoked sooner. Performed at [facility], [address] [place], [place] [address]   Blood culture (routine x 2)     Status: None (Preliminary result)   Collection Time: 02/13/19 12:46 PM   Specimen: BLOOD RIGHT HAND  Result Value Ref Range Status   Specimen Description BLOOD RIGHT HAND  Final   Special Requests   Final    BOTTLES DRAWN AEROBIC AND ANAEROBIC Blood Culture adequate volume   Culture   Final    NO GROWTH 2 DAYS Performed at [facility], [address] [place], [place] [address]    Report Status PENDING  Incomplete  Blood culture (routine x 2)     Status: None (Preliminary result)   Collection Time: 02/13/19 12:59 PM   Specimen: Left Antecubital; Blood  Result Value Ref Range Status   Specimen Description LEFT ANTECUBITAL  Final   Special Requests   Final    BOTTLES DRAWN AEROBIC AND ANAEROBIC Blood Culture adequate volume   Culture   Final    NO GROWTH 2 DAYS Performed at [facility], [address] [place], [place] [address]    Report Status PENDING  Incomplete  MRSA PCR Screening     Status: Abnormal   Collection Time: 02/13/19  6:36 PM   Specimen: Nasopharyngeal  Result  Value Ref Range Status   MRSA by PCR POSITIVE (A) NEGATIVE Final    Comment:        The GeneXpert MRSA  Assay (FDA approved for NASAL specimens only), is one component of a comprehensive MRSA colonization surveillance program. It is not intended to diagnose MRSA infection nor to guide or monitor treatment for MRSA infections. RESULT CALLED TO, READ BACK BY AND VERIFIED WITH: J SMITH,RN  02/13/19 [facility] Performed at [facility], [address], [place], [place] [address]      Labs: Basic Metabolic Panel: Recent Labs  Lab 02/13/19 0117 02/13/19 1246 02/14/19 0507 02/15/19 0449  NA 142 140 143 140  K 4.1 5.2* 4.4 4.2  CL 104 102 112* 104  CO2 GLUCOSE 112* 216* 183* 154*  BUN CREATININE 0.80 0.98 0.64 0.65  CALCIUM 8.9 8.6* 8.0* 8.7*   Liver Function Tests: Recent Labs  Lab 02/13/19 1246  AST 39  ALT 29  ALKPHOS 54  BILITOT 0.8  PROT 7.3  ALBUMIN 3.8   Recent Labs  Lab 02/13/19 1246  LIPASE 23   No results for input(s): AMMONIA in the last 168 hours. CBC: Recent Labs  Lab 02/13/19 0117 02/13/19 1246 02/14/19 0507  WBC 8.1 9.3 4.9  NEUTROABS 6.9 7.8*  --   HGB 13.9 13.1 11.5*  HCT 42.2 39.3 36.5*  MCV 91.3 92.0 96.3  PLT 169 174 116*   Cardiac Enzymes: No results for input(s): CKTOTAL, CKMB, CKMBINDEX, TROPONINI in the last 168 hours. BNP: Invalid input(s): POCBNP CBG: No results for input(s): GLUCAP in the last 168 hours.  Time coordinating discharge:  36 minutes  Signed:  Catarina Hartshorn, DO [facility] Pager: [phone] 02/15/2019, 12:39 PM

## 2019-02-15 NOTE — TOC Transition Note (Signed)
Transition of Care [facility]) - CM/SW Discharge Note   Patient Details  Name: Christian Ellison MRN: [id number] Date of Birth: Jun 05, 1961  [facility]) CM/SW Contact:  Loriene Taunton, Chauncey Reading, RN Phone Number: 02/15/2019, 12:48 PM   Clinical Narrative:   Patient discharging today. [employer] infromation given to patient to follow up with self pay for CPAP if he is interested. F/u appt scheduled. No other TOC needs.       Barriers to Discharge: Continued Medical Work up, Other (comment)(no insurance)       Readmission Risk Interventions Readmission Risk Prevention Plan 02/14/2019 02/14/2019  Transportation Screening - Complete  PCP or Specialist Appt within 3-5 Days Complete Not Complete  HRI or Home Care Consult - Not Complete  Social Work Consult for [place] Planning/Counseling - Complete  Palliative Care Screening - Not Complete  Medication Review [employer]) - Complete  Some recent data might be hidden

## 2019-02-18 LAB — CULTURE, BLOOD (ROUTINE X 2)
Culture: NO GROWTH
Culture: NO GROWTH
Special Requests: ADEQUATE
Special Requests: ADEQUATE

## 2019-02-22 ENCOUNTER — Ambulatory Visit (INDEPENDENT_AMBULATORY_CARE_PROVIDER_SITE_OTHER): Payer: Medicaid Other | Admitting: Internal Medicine

## 2019-02-23 ENCOUNTER — Other Ambulatory Visit (INDEPENDENT_AMBULATORY_CARE_PROVIDER_SITE_OTHER): Payer: Self-pay | Admitting: Internal Medicine

## 2019-02-23 DIAGNOSIS — B1710 Acute hepatitis C without hepatic coma: Secondary | ICD-10-CM

## 2019-03-01 ENCOUNTER — Telehealth (INDEPENDENT_AMBULATORY_CARE_PROVIDER_SITE_OTHER): Payer: Self-pay | Admitting: Internal Medicine

## 2019-03-01 LAB — CBC WITH DIFFERENTIAL/PLATELET
Absolute Monocytes: 567 cells/uL (ref 200–950)
Basophils Absolute: 37 cells/uL (ref 0–200)
Basophils Relative: 0.4 %
Eosinophils Absolute: 47 cells/uL (ref 15–500)
Eosinophils Relative: 0.5 %
HCT: 43.4 % (ref 38.5–50.0)
Hemoglobin: 14.3 g/dL (ref 13.2–17.1)
Lymphs Abs: 1404 cells/uL (ref 850–3900)
MCH: 30.2 pg (ref 27.0–33.0)
MCHC: 32.9 g/dL (ref 32.0–36.0)
MCV: 91.6 fL (ref 80.0–100.0)
MPV: 9.6 fL (ref 7.5–12.5)
Monocytes Relative: 6.1 %
Neutro Abs: 7245 cells/uL (ref 1500–7800)
Neutrophils Relative %: 77.9 %
Platelets: 232 10*3/uL (ref 140–400)
RBC: 4.74 10*6/uL (ref 4.20–5.80)
RDW: 13.5 % (ref 11.0–15.0)
Total Lymphocyte: 15.1 %
WBC: 9.3 10*3/uL (ref 3.8–10.8)

## 2019-03-01 LAB — HEPATIC FUNCTION PANEL
AG Ratio: 1.5 (calc) (ref 1.0–2.5)
ALT: 19 U/L (ref 9–46)
AST: 11 U/L (ref 10–35)
Albumin: 4.3 g/dL (ref 3.6–5.1)
Alkaline phosphatase (APISO): 73 U/L (ref 35–144)
Bilirubin, Direct: 0.1 mg/dL (ref 0.0–0.2)
Globulin: 2.8 g/dL (calc) (ref 1.9–3.7)
Indirect Bilirubin: 0.4 mg/dL (calc) (ref 0.2–1.2)
Total Bilirubin: 0.5 mg/dL (ref 0.2–1.2)
Total Protein: 7.1 g/dL (ref 6.1–8.1)

## 2019-03-01 LAB — HEPATITIS C RNA QUANTITATIVE
HCV Quantitative Log: <1.18 Log IU/mL
HCV RNA, PCR, QN: <15 IU/mL

## 2019-03-01 NOTE — Telephone Encounter (Signed)
6 mth US Noted in recall 

## 2019-03-01 NOTE — Telephone Encounter (Signed)
[place] RUQ in 6 months. Hepatitis C.

## 2019-03-10 ENCOUNTER — Other Ambulatory Visit: Payer: Self-pay

## 2019-03-10 ENCOUNTER — Emergency Department (HOSPITAL_COMMUNITY)
Admission: EM | Admit: 2019-03-10 | Discharge: 2019-03-10 | Disposition: A | Discharge status: Home / Self Care | Payer: Self-pay | Attending: Emergency Medicine | Admitting: Emergency Medicine

## 2019-03-10 ENCOUNTER — Emergency Department (HOSPITAL_COMMUNITY): Payer: Self-pay

## 2019-03-10 ENCOUNTER — Encounter (HOSPITAL_COMMUNITY): Payer: Self-pay | Admitting: Emergency Medicine

## 2019-03-10 DIAGNOSIS — Z7982 Long term (current) use of aspirin: Secondary | ICD-10-CM | POA: Insufficient documentation

## 2019-03-10 DIAGNOSIS — I110 Hypertensive heart disease with heart failure: Secondary | ICD-10-CM | POA: Insufficient documentation

## 2019-03-10 DIAGNOSIS — Z79899 Other long term (current) drug therapy: Secondary | ICD-10-CM | POA: Insufficient documentation

## 2019-03-10 DIAGNOSIS — Z20828 Contact with and (suspected) exposure to other viral communicable diseases: Secondary | ICD-10-CM | POA: Insufficient documentation

## 2019-03-10 DIAGNOSIS — I509 Heart failure, unspecified: Secondary | ICD-10-CM | POA: Insufficient documentation

## 2019-03-10 DIAGNOSIS — J189 Pneumonia, unspecified organism: Secondary | ICD-10-CM | POA: Insufficient documentation

## 2019-03-10 LAB — CBC WITH DIFFERENTIAL/PLATELET
Abs Immature Granulocytes: 0.03 10*3/uL (ref 0.00–0.07)
Basophils Absolute: 0 10*3/uL (ref 0.0–0.1)
Basophils Relative: 0 %
Eosinophils Absolute: 0.2 10*3/uL (ref 0.0–0.5)
Eosinophils Relative: 3 %
HCT: 39.9 % (ref 39.0–52.0)
Hemoglobin: 13 g/dL (ref 13.0–17.0)
Immature Granulocytes: 0 %
Lymphocytes Relative: 21 %
Lymphs Abs: 1.4 10*3/uL (ref 0.7–4.0)
MCH: 30.3 pg (ref 26.0–34.0)
MCHC: 32.6 g/dL (ref 30.0–36.0)
MCV: 93 fL (ref 80.0–100.0)
Monocytes Absolute: 0.4 10*3/uL (ref 0.1–1.0)
Monocytes Relative: 6 %
Neutro Abs: 4.7 10*3/uL (ref 1.7–7.7)
Neutrophils Relative %: 70 %
Platelets: 150 10*3/uL (ref 150–400)
RBC: 4.29 MIL/uL (ref 4.22–5.81)
RDW: 13.3 % (ref 11.5–15.5)
WBC: 6.8 10*3/uL (ref 4.0–10.5)
nRBC: 0 % (ref 0.0–0.2)

## 2019-03-10 LAB — COMPREHENSIVE METABOLIC PANEL
ALT: 26 U/L (ref 0–44)
AST: 22 U/L (ref 15–41)
Albumin: 3.7 g/dL (ref 3.5–5.0)
Alkaline Phosphatase: 62 U/L (ref 38–126)
Anion gap: 7 (ref 5–15)
BUN: 16 mg/dL (ref 6–20)
CO2: 28 mmol/L (ref 22–32)
Calcium: 8.5 mg/dL — ABNORMAL LOW (ref 8.9–10.3)
Chloride: 104 mmol/L (ref 98–111)
Creatinine, Ser: 0.72 mg/dL (ref 0.61–1.24)
GFR calc Af Amer: >60 mL/min (ref >60)
GFR calc non Af Amer: >60 mL/min (ref >60)
Glucose, Bld: 142 mg/dL — ABNORMAL HIGH (ref 70–99)
Potassium: 3.6 mmol/L (ref 3.5–5.1)
Sodium: 139 mmol/L (ref 135–145)
Total Bilirubin: 0.4 mg/dL (ref 0.3–1.2)
Total Protein: 6.8 g/dL (ref 6.5–8.1)

## 2019-03-10 LAB — SARS CORONAVIRUS 2 BY RT PCR (HOSPITAL ORDER, PERFORMED IN ~~LOC~~ HOSPITAL LAB): SARS Coronavirus 2: NEGATIVE

## 2019-03-10 LAB — URINALYSIS, ROUTINE W REFLEX MICROSCOPIC
Bilirubin Urine: NEGATIVE
Glucose, UA: NEGATIVE mg/dL
Hgb urine dipstick: NEGATIVE
Ketones, ur: NEGATIVE mg/dL
Leukocytes,Ua: NEGATIVE
Nitrite: NEGATIVE
Protein, ur: NEGATIVE mg/dL
Specific Gravity, Urine: 1.011 (ref 1.005–1.030)
pH: 5 (ref 5.0–8.0)

## 2019-03-10 LAB — RAPID URINE DRUG SCREEN, HOSP PERFORMED
Amphetamines: POSITIVE — AB
Barbiturates: NOT DETECTED
Benzodiazepines: POSITIVE — AB
Cocaine: NOT DETECTED
Opiates: NOT DETECTED
Tetrahydrocannabinol: NOT DETECTED

## 2019-03-10 LAB — LIPASE, BLOOD: Lipase: 19 U/L (ref 11–51)

## 2019-03-10 LAB — ETHANOL: Alcohol, Ethyl (B): <10 mg/dL (ref <10)

## 2019-03-10 MED ORDER — AMOXICILLIN 500 MG PO CAPS: 500.0000 mg | ORAL_CAPSULE | Freq: Three times a day (TID) | ORAL | 0 refills | Status: DC

## 2019-03-10 MED ORDER — AZITHROMYCIN 250 MG PO TABS: ORAL_TABLET | ORAL | 0 refills | Status: DC

## 2019-03-10 MED ORDER — IOHEXOL 350 MG/ML SOLN
75.0000 mL | Freq: Once | INTRAVENOUS | Status: AC | PRN
  Administered 2019-03-10: 75 mL via INTRAVENOUS

## 2019-03-10 MED ORDER — SODIUM CHLORIDE 0.9 % IV BOLUS
1000.0000 mL | Freq: Once | INTRAVENOUS | Status: AC
  Administered 2019-03-10: 1000 mL via INTRAVENOUS

## 2019-03-10 NOTE — ED Triage Notes (Signed)
Pt c/o fatigue over the past 2-3 days. Reports that he has been working outside a lot. Pt states that he decided to check his oxygen at home and it was in the 80s. Pt also stated his HR read in the 120s. Pt denies fever, cough, or SOB. Pt does endorse that he has esophogeal strictures that makes him prone to pneumonia.

## 2019-03-10 NOTE — ED Notes (Signed)
Ambulated patient and his ox was between 100% and 96% notified provider

## 2019-03-10 NOTE — Discharge Instructions (Signed)
You have been diagnosed with pneumonia.  Please take antibiotics prescribed. Your low oxygen is likely due to sleep apnea.  You may need a CPAP machine.  However, it is important to follow up with your doctor for further evaluation. A sleep study would be beneficial for your evaluation.  Sleep apnea can cause fatigue and low oxygen level.  Return if you have any concerns.

## 2019-03-10 NOTE — ED Provider Notes (Signed)
[facility] Provider Note   CSN: [id number] Arrival date & time: 03/10/19  1410     History   Chief Complaint Chief Complaint  Patient presents with   Fatigue    HPI Christian Ellison is a 58 y.o. male.     The history is provided by the patient and medical records. No language interpreter was used.     58 year old male with history of esophageal stenosis with multiple dilation, history of reflux, recurrent aspiration pneumonia as well opiate abuse presenting with complaints of generalized fatigue.  History is difficult to obtain as patient appears drowsy.  Patient states he has been working outside in the heat for the past few days and he does endorse generalized fatigue.  His girlfriend has an oximeter at home and states that at night his oxygen level drops down in the 80s and his heart rate was fast and she was concerned.  He mention he has history of recurrent pneumonia in the past however he denies any associated fever chills no headache no chest pain shortness of breath productive cough nausea vomiting or diarrhea.  He denies alcohol use drug use.  He denies opiate use.  He endorsed feeling tired and would like to sleep.  He mention his girlfriend is currently going through some court cases where he is having pain trouble getting adequate sleep at night.  He also mention been tested for COVID-19 several times with negative results.  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    Opiate abuse, continuous [facility](HCC)     Patient Active Problem List   Diagnosis Date Noted   Bronchopneumonia 11/06/2018   Acute Respiratory failure with hypoxia (2/2 Asp PNA) 11/06/2018   Recurrent aspiration pneumonia (HCC)    Fever 06/09/2018   Tachycardia 06/09/2018   Hypoxia 06/09/2018   Hypotension  06/09/2018   Esophageal dysmotility/Esophageal stricture/stenosis/esophageal dysmotility with recurrent aspiration    Aspiration pneumonia of right lower lobe (HCC)    Sepsis (HCC) 05/26/2018   Pulmonary edema 02/02/2018   HCAP (healthcare-associated pneumonia) 02/02/2018   Weakness generalized 02/02/2018   Hypertension 02/02/2018   CHF (congestive heart failure) (HCC) 02/02/2018   Nausea & vomiting 02/02/2018   Dysphagia 02/02/2018   Esophageal stricture/stenosis/esophageal dysmotility with recurrent aspiration 02/02/2018   Esophageal stenosis 02/02/2018   GERD (gastroesophageal reflux disease) 02/02/2018   Polysubstance abuse (HCC) 02/02/2018   Acute respiratory failure with hypoxia (HCC)    Transaminitis    Elevated troponin    Overdose 01/02/2018   Hx of hepatitis C 01/17/2013   Depression 01/17/2013   Anxiety with depression 01/17/2013   Right knee pain 01/17/2013    Past Surgical History:  Procedure Laterality Date   BIOPSY  03/18/2018   Procedure: BIOPSY;  Surgeon: Malissa Hippoehman, Najeeb U, MD;  Location[facility];  Service: Endoscopy;;  gastric    COLONOSCOPY N/A 11/22/2013   Dr. Karilyn Cotaehman: Normal except for hemorrhoids.  Next colonoscopy 10 years.   ESOPHAGEAL DILATION N/A 03/18/2018   Procedure: ESOPHAGEAL DILATION;  Surgeon: Malissa Hippoehman, Najeeb U, MD;  Location: [facility];  Service: Endoscopy;  Laterality: N/A;   ESOPHAGEAL MANOMETRY N/A 06/15/2018   Procedure: ESOPHAGEAL MANOMETRY (EM);  Surgeon: Napoleon FormNandigam, Kavitha V, MD;  Location: [facility];  Service: Endoscopy;  Laterality: N/A;   ESOPHAGOGASTRODUODENOSCOPY (  EGD) WITH PROPOFOL N/A 03/18/2018   Dr. Karilyn Cotaehman: Benign-appearing mild esophageal stenosis proximal to the GE J, status post dilation.  2 cm hiatal hernia.  Gastritis, biopsies negative for H. pylori   Fracture Right Leg     Patient has rod/screws in this leg   Rotor Cuff  2012   Right Shoulder        Home Medications    Prior to  Admission medications   Medication Sig Start Date End Date Taking? Authorizing Provider  albuterol (PROVENTIL HFA;VENTOLIN HFA) 108 (90 Base) MCG/ACT inhaler Inhale 2 puffs into the lungs every 6 (six) hours as needed for wheezing or shortness of breath. 08/22/18   Rodriguez-Southworth, Nettie ElmSylvia, PA-C  ALPRAZolam Prudy Feeler(XANAX) 0.5 MG tablet Take 0.5 mg by mouth 2 (two) times daily.    [provider]  amoxicillin-clavulanate (AUGMENTIN) 875-125 MG tablet Take 1 tablet by mouth every 12 (twelve) hours. 02/13/19   Horton, Mayer Maskerourtney F, MD  aspirin EC 81 MG tablet Take 81 mg by mouth daily.    [provider]  atenolol (TENORMIN) 25 MG tablet Take 25 mg by mouth daily.    [provider]  dexamethasone (DECADRON) 1 MG tablet Take 2 tablets (2 mg total) by mouth every 12 (twelve) hours. X 7 days.  Then 1 tablet (1 mg) every 12 hours x 7 days.  Then 1 tablet (1 mg) once daily x 7 days. 02/15/19   Catarina Hartshornat, David, MD  levothyroxine (SYNTHROID, LEVOTHROID) 25 MCG tablet Take 25 mcg by mouth daily. 05/13/18   [provider]  Multiple Vitamin (MULTIVITAMIN WITH MINERALS) TABS tablet Take 1 tablet by mouth daily. 50+    [provider]  ondansetron (ZOFRAN ODT) 4 MG disintegrating tablet Take 1 tablet (4 mg total) by mouth every 8 (eight) hours as needed for nausea or vomiting. 02/13/19   Horton, Mayer Maskerourtney F, MD  pantoprazole (PROTONIX) 40 MG tablet Take 1 tablet (40 mg total) by mouth 2 (two) times daily. 08/22/18 02/13/19  Rodriguez-Southworth, Nettie ElmSylvia, PA-C    Family History Family History  Problem Relation Age of Onset   Breast cancer Mother    Colon cancer Father    Bladder Cancer Father    Prostate cancer Father    Hypertension Sister    Hypothyroidism Sister    Healthy Sister    Healthy Daughter    Healthy Son    Healthy Son     Social History Social History   Tobacco Use   Smoking status: Never Smoker   Smokeless tobacco: Never Used  Substance Use  Topics   Alcohol use: No    Comment: Patient states that it has been over a year   Drug use: Yes    Types: Benzodiazepines    Comment: opiates     Allergies   Patient has no known allergies.   Review of Systems Review of Systems  All other systems reviewed and are negative.    Physical Exam Updated Vital Signs BP (!) 140/99    Pulse 85    Temp 98.2 F (36.8 C) (Oral)    Resp 19    Ht 6' (1.829 m)    Wt 86.2 kg    SpO2 96%    BMI 25.77 kg/m   Physical Exam Vitals signs and nursing note reviewed.  Constitutional:      General: He is not in acute distress.    Appearance: He is well-developed.     Comments: Drowsy but easily arousable in no acute discomfort.  HENT:     Head: Atraumatic.  Eyes:     Extraocular Movements: Extraocular movements intact.     Conjunctiva/sclera: Conjunctivae normal.     Pupils: Pupils are equal, round, and reactive to light.  Neck:     Musculoskeletal: Neck supple. No neck rigidity.  Cardiovascular:     Rate and Rhythm: Tachycardia present.     Pulses: Normal pulses.     Heart sounds: Normal heart sounds.  Pulmonary:     Effort: Pulmonary effort is normal.     Breath sounds: Normal breath sounds. No wheezing, rhonchi or rales.  Abdominal:     General: There is no distension.     Palpations: Abdomen is soft.     Tenderness: There is no abdominal tenderness.  Skin:    Findings: No rash.  Neurological:     Mental Status: He is disoriented.     GCS: GCS eye subscore is 4. GCS verbal subscore is 5. GCS motor subscore is 6.      ED Treatments / Results  Labs (all labs ordered are listed, but only abnormal results are displayed) Labs Reviewed  COMPREHENSIVE METABOLIC PANEL - Abnormal; Notable for the following components:      Result Value   Glucose, Bld 142 (*)    Calcium 8.5 (*)    All other components within normal limits  RAPID URINE DRUG SCREEN, HOSP PERFORMED - Abnormal; Notable for the following components:    Benzodiazepines POSITIVE (*)    Amphetamines POSITIVE (*)    All other components within normal limits  SARS CORONAVIRUS 2 (HOSPITAL ORDER, [place] [facility])  CBC WITH DIFFERENTIAL/PLATELET  LIPASE, BLOOD  URINALYSIS, ROUTINE W REFLEX MICROSCOPIC  ETHANOL    EKG None  ED ECG REPORT   Date: 03/10/2019  Rate: 93  Rhythm: normal sinus rhythm  QRS Axis: normal  Intervals: normal  ST/T Wave abnormalities: nonspecific T wave changes  Conduction Disutrbances:none  Narrative Interpretation:   Old EKG Reviewed: unchanged  I have personally reviewed the EKG tracing and agree with the computerized printout as noted.   Radiology Ct Angio Chest Pe W And/or Wo Contrast  Result Date: 03/10/2019 CLINICAL DATA:  Shortness of breath, fatigue EXAM: CT ANGIOGRAPHY CHEST WITH CONTRAST TECHNIQUE: Multidetector CT imaging of the chest was performed using the standard protocol during bolus administration of intravenous contrast. Multiplanar CT image reconstructions and MIPs were obtained to evaluate the vascular anatomy. CONTRAST:  20mL OMNIPAQUE IOHEXOL 350 MG/ML SOLN COMPARISON:  02/13/2019 FINDINGS: Cardiovascular: Satisfactory opacification of the pulmonary arteries to the segmental level. No evidence of pulmonary embolism. Normal heart size. No pericardial effusion. Mediastinum/Nodes: No enlarged mediastinal, hilar, or axillary lymph nodes. Patulous esophagus. Thyroid gland and trachea demonstrate no significant findings. Lungs/Pleura: There are numerous clustered small nodular and ground-glass opacities bilaterally, most conspicuous in the right lower lobe (series 6, image 102). No pleural effusion or pneumothorax. Upper Abdomen: No acute abnormality. Musculoskeletal: No chest wall abnormality. No acute or significant osseous findings. Review of the MIP images confirms the above findings. IMPRESSION: 1.  Negative examination for pulmonary embolism. 2. There are numerous clustered  small nodular and ground-glass opacities bilaterally, most conspicuous in the right lower lobe (series 6, image 102). Findings are consistent with multifocal atypical infection. Electronically Signed   By: Eddie Candle M.D.   On: 03/10/2019 17:11   Dg Chest Portable 1 View  Result Date: 03/10/2019 CLINICAL DATA:  FATIGUE X COUPLE OF DAYS/HX PNEUMONIA/NON SMOKER EXAM: PORTABLE CHEST  1 VIEW COMPARISON:  CT angio chest 02/13/2019, portable chest x-ray on 02/13/2019 FINDINGS: Heart size is normal. The lungs are free of focal consolidations and pleural effusions. No pulmonary edema. IMPRESSION: No active disease. Electronically Signed   By: Norva PavlovElizabeth  Brown M.D.   On: 03/10/2019 15:14    Procedures Procedures (including critical care time)  Medications Ordered in ED Medications  sodium chloride 0.9 % bolus 1,000 mL (0 mLs Intravenous Stopped 03/10/19 1604)  iohexol (OMNIPAQUE) 350 MG/ML injection 75 mL (75 mLs Intravenous Contrast Given 03/10/19 1658)     Initial Impression / Assessment and Plan / ED Course  I have reviewed the triage vital signs and the nursing notes.  Pertinent labs & imaging results that were available during my care of the patient were reviewed by me and considered in my medical decision making (see chart for details).        BP 115/77    Pulse 94    Temp 98.2 F (36.8 C) (Oral)    Resp 18    Ht 6' (1.829 m)    Wt 86.2 kg    SpO2 (!) 84%    BMI 25.77 kg/m    Final Clinical Impressions(s) / ED Diagnoses   Final diagnoses:  Atypical pneumonia    ED Discharge Orders         Ordered    azithromycin (ZITHROMAX Z-PAK) 250 MG tablet     03/10/19 1743    amoxicillin (AMOXIL) 500 MG capsule  3 times daily     03/10/19 1745         2:50 PM Patient here complaining of generalized fatigue.  He attributed to being outside in the sun working in his vomiting for the past few days in the heat as well as not having adequate sleep.  He also report that his home oxygen  monitor was indicating that his oxygen was in the 80s and his heart rate was fast.  During my interview, patient appears to be drowsy, but arousable.  I suspect component of opioid use contributing to his current status.  Will perform screening exam and labs, IV fluid given, will monitor closely.  Does have history of recurrent aspiration pneumonia, will obtain chest x-ray and check for COVID-19.  4:53 PM UA without signs of urinary tract infection, UDS shows positive benzodiazepine as well as amphetamine.  Patient does take regular benzodiazepine as prescribed.  COVID-19 testing is negative.  Normal WBC, normal H&H, electrolyte panels are reassuring, normal lipase, alcohol level is undetectable, chest x-ray unremarkable, EKG unremarkable.  Patient does have several documented hypoxia in the ER with O2 84% on room air.  This is likely after he is sleeping and having periods of apnea.  However, will obtain chest CTA to r/o underlying PE.    5:27 PM When ambulating, O2 sats is 96% and patient in no respiratory discomfort.  Chest CT angiogram obtained without any evidence of PE however there are numerous clustered small nodule and groundglass opacity bilaterally with finding consistent's of multifocal atypical infection.  Plan to treat with a Z-Pak and amoxicillin.  Encourage patient to follow-up with PCP for management for potential sleep apnea causing his episodic hypoxia and generalized fatigue.  Return precaution discussed.   Fayrene Helperran, Shellia Hartl, PA-C 03/10/19 1748    Bethann BerkshireZammit, Joseph, MD 03/15/19 [phone]

## 2019-03-11 ENCOUNTER — Emergency Department (HOSPITAL_COMMUNITY): Payer: Medicaid Other

## 2019-03-11 ENCOUNTER — Other Ambulatory Visit: Payer: Self-pay

## 2019-03-11 ENCOUNTER — Encounter (HOSPITAL_COMMUNITY): Payer: Self-pay

## 2019-03-11 ENCOUNTER — Inpatient Hospital Stay (HOSPITAL_COMMUNITY)
Admission: EM | Admit: 2019-03-11 | Discharge: 2019-03-13 | DRG: 871 | Disposition: A | Discharge status: Home / Self Care | Payer: Medicaid Other | Attending: Family Medicine | Admitting: Family Medicine

## 2019-03-11 ENCOUNTER — Encounter (INDEPENDENT_AMBULATORY_CARE_PROVIDER_SITE_OTHER): Payer: Self-pay

## 2019-03-11 DIAGNOSIS — F1120 Opioid dependence, uncomplicated: Secondary | ICD-10-CM | POA: Diagnosis present

## 2019-03-11 DIAGNOSIS — I509 Heart failure, unspecified: Secondary | ICD-10-CM

## 2019-03-11 DIAGNOSIS — R531 Weakness: Secondary | ICD-10-CM

## 2019-03-11 DIAGNOSIS — I110 Hypertensive heart disease with heart failure: Secondary | ICD-10-CM | POA: Diagnosis present

## 2019-03-11 DIAGNOSIS — F1320 Sedative, hypnotic or anxiolytic dependence, uncomplicated: Secondary | ICD-10-CM | POA: Diagnosis present

## 2019-03-11 DIAGNOSIS — E039 Hypothyroidism, unspecified: Secondary | ICD-10-CM | POA: Diagnosis present

## 2019-03-11 DIAGNOSIS — R000 Tachycardia, unspecified: Secondary | ICD-10-CM | POA: Diagnosis present

## 2019-03-11 DIAGNOSIS — G9341 Metabolic encephalopathy: Secondary | ICD-10-CM | POA: Diagnosis present

## 2019-03-11 DIAGNOSIS — I5032 Chronic diastolic (congestive) heart failure: Secondary | ICD-10-CM | POA: Diagnosis present

## 2019-03-11 DIAGNOSIS — Z20828 Contact with and (suspected) exposure to other viral communicable diseases: Secondary | ICD-10-CM | POA: Diagnosis present

## 2019-03-11 DIAGNOSIS — F418 Other specified anxiety disorders: Secondary | ICD-10-CM | POA: Diagnosis present

## 2019-03-11 DIAGNOSIS — Z8619 Personal history of other infectious and parasitic diseases: Secondary | ICD-10-CM | POA: Diagnosis present

## 2019-03-11 DIAGNOSIS — A419 Sepsis, unspecified organism: Principal | ICD-10-CM | POA: Diagnosis present

## 2019-03-11 DIAGNOSIS — K219 Gastro-esophageal reflux disease without esophagitis: Secondary | ICD-10-CM | POA: Diagnosis present

## 2019-03-11 DIAGNOSIS — K222 Esophageal obstruction: Secondary | ICD-10-CM | POA: Diagnosis present

## 2019-03-11 DIAGNOSIS — K224 Dyskinesia of esophagus: Secondary | ICD-10-CM | POA: Diagnosis present

## 2019-03-11 DIAGNOSIS — K2289 Other specified disease of esophagus: Secondary | ICD-10-CM

## 2019-03-11 DIAGNOSIS — G8929 Other chronic pain: Secondary | ICD-10-CM | POA: Diagnosis present

## 2019-03-11 DIAGNOSIS — F1910 Other psychoactive substance abuse, uncomplicated: Secondary | ICD-10-CM | POA: Diagnosis present

## 2019-03-11 DIAGNOSIS — Z8614 Personal history of Methicillin resistant Staphylococcus aureus infection: Secondary | ICD-10-CM

## 2019-03-11 DIAGNOSIS — J690 Pneumonitis due to inhalation of food and vomit: Secondary | ICD-10-CM | POA: Diagnosis present

## 2019-03-11 DIAGNOSIS — Z8249 Family history of ischemic heart disease and other diseases of the circulatory system: Secondary | ICD-10-CM

## 2019-03-11 DIAGNOSIS — Z7982 Long term (current) use of aspirin: Secondary | ICD-10-CM

## 2019-03-11 DIAGNOSIS — Z8701 Personal history of pneumonia (recurrent): Secondary | ICD-10-CM

## 2019-03-11 DIAGNOSIS — J9601 Acute respiratory failure with hypoxia: Secondary | ICD-10-CM | POA: Diagnosis present

## 2019-03-11 DIAGNOSIS — J189 Pneumonia, unspecified organism: Secondary | ICD-10-CM

## 2019-03-11 HISTORY — DX: Sepsis, unspecified organism: A41.9

## 2019-03-11 LAB — COMPREHENSIVE METABOLIC PANEL
ALT: 28 U/L (ref 0–44)
AST: 23 U/L (ref 15–41)
Albumin: 3.7 g/dL (ref 3.5–5.0)
Alkaline Phosphatase: 58 U/L (ref 38–126)
Anion gap: 10 (ref 5–15)
BUN: 11 mg/dL (ref 6–20)
CO2: 29 mmol/L (ref 22–32)
Calcium: 8.7 mg/dL — ABNORMAL LOW (ref 8.9–10.3)
Chloride: 103 mmol/L (ref 98–111)
Creatinine, Ser: 0.75 mg/dL (ref 0.61–1.24)
GFR calc Af Amer: >60 mL/min (ref >60)
GFR calc non Af Amer: >60 mL/min (ref >60)
Glucose, Bld: 136 mg/dL — ABNORMAL HIGH (ref 70–99)
Potassium: 4 mmol/L (ref 3.5–5.1)
Sodium: 142 mmol/L (ref 135–145)
Total Bilirubin: 0.5 mg/dL (ref 0.3–1.2)
Total Protein: 7.2 g/dL (ref 6.5–8.1)

## 2019-03-11 LAB — URINALYSIS, ROUTINE W REFLEX MICROSCOPIC
Bilirubin Urine: NEGATIVE
Glucose, UA: NEGATIVE mg/dL
Hgb urine dipstick: NEGATIVE
Ketones, ur: NEGATIVE mg/dL
Leukocytes,Ua: NEGATIVE
Nitrite: NEGATIVE
Protein, ur: NEGATIVE mg/dL
Specific Gravity, Urine: 1.024 (ref 1.005–1.030)
pH: 5 (ref 5.0–8.0)

## 2019-03-11 LAB — BLOOD GAS, ARTERIAL
Acid-Base Excess: 4.9 mmol/L — ABNORMAL HIGH (ref 0.0–2.0)
Acid-Base Excess: 4.8 mmol/L — ABNORMAL HIGH (ref 0.0–2.0)
Bicarbonate: 27.2 mmol/L (ref 20.0–28.0)
Bicarbonate: 26.4 mmol/L (ref 20.0–28.0)
FIO2: 44
FIO2: 44
O2 Saturation: 87.6 %
O2 Saturation: 96.5 %
Patient temperature: 37
Patient temperature: 37
pCO2 arterial: 58.6 mmHg — ABNORMAL HIGH (ref 32.0–48.0)
pCO2 arterial: 63.5 mmHg — ABNORMAL HIGH (ref 32.0–48.0)
pH, Arterial: 7.335 — ABNORMAL LOW (ref 7.350–7.450)
pH, Arterial: 7.307 — ABNORMAL LOW (ref 7.350–7.450)
pO2, Arterial: 56.6 mmHg — ABNORMAL LOW (ref 83.0–108.0)
pO2, Arterial: 89.6 mmHg (ref 83.0–108.0)

## 2019-03-11 LAB — CBC WITH DIFFERENTIAL/PLATELET
Abs Immature Granulocytes: 0.03 10*3/uL (ref 0.00–0.07)
Basophils Absolute: 0 10*3/uL (ref 0.0–0.1)
Basophils Relative: 0 %
Eosinophils Absolute: 0.2 10*3/uL (ref 0.0–0.5)
Eosinophils Relative: 3 %
HCT: 45.2 % (ref 39.0–52.0)
Hemoglobin: 14.3 g/dL (ref 13.0–17.0)
Immature Granulocytes: 1 %
Lymphocytes Relative: 17 %
Lymphs Abs: 1 10*3/uL (ref 0.7–4.0)
MCH: 30.6 pg (ref 26.0–34.0)
MCHC: 31.6 g/dL (ref 30.0–36.0)
MCV: 96.6 fL (ref 80.0–100.0)
Monocytes Absolute: 0.2 10*3/uL (ref 0.1–1.0)
Monocytes Relative: 3 %
Neutro Abs: 4.5 10*3/uL (ref 1.7–7.7)
Neutrophils Relative %: 76 %
Platelets: 146 10*3/uL — ABNORMAL LOW (ref 150–400)
RBC: 4.68 MIL/uL (ref 4.22–5.81)
RDW: 13.2 % (ref 11.5–15.5)
WBC: 5.9 10*3/uL (ref 4.0–10.5)
nRBC: 0 % (ref 0.0–0.2)

## 2019-03-11 LAB — RAPID URINE DRUG SCREEN, HOSP PERFORMED
Amphetamines: NOT DETECTED
Barbiturates: NOT DETECTED
Benzodiazepines: POSITIVE — AB
Cocaine: NOT DETECTED
Opiates: NOT DETECTED
Tetrahydrocannabinol: NOT DETECTED

## 2019-03-11 LAB — PROTIME-INR
INR: 0.9 (ref 0.8–1.2)
Prothrombin Time: 12.4 seconds (ref 11.4–15.2)

## 2019-03-11 LAB — SARS CORONAVIRUS 2 BY RT PCR (HOSPITAL ORDER, PERFORMED IN ~~LOC~~ HOSPITAL LAB): SARS Coronavirus 2: NEGATIVE

## 2019-03-11 LAB — MRSA PCR SCREENING: MRSA by PCR: NEGATIVE

## 2019-03-11 LAB — LACTIC ACID, PLASMA
Lactic Acid, Venous: 1.5 mmol/L (ref 0.5–1.9)
Lactic Acid, Venous: 1.5 mmol/L (ref 0.5–1.9)

## 2019-03-11 LAB — STREP PNEUMONIAE URINARY ANTIGEN: Strep Pneumo Urinary Antigen: NEGATIVE

## 2019-03-11 LAB — APTT: aPTT: 29 seconds (ref 24–36)

## 2019-03-11 MED ORDER — ORAL CARE MOUTH RINSE
15.0000 mL | Freq: Two times a day (BID) | OROMUCOSAL | Status: DC
  Administered 2019-03-12 – 2019-03-13 (×2): 15 mL via OROMUCOSAL

## 2019-03-11 MED ORDER — VANCOMYCIN HCL IN DEXTROSE 1-5 GM/200ML-% IV SOLN
1000.0000 mg | Freq: Three times a day (TID) | INTRAVENOUS | Status: DC
  Administered 2019-03-11 – 2019-03-13 (×6): 1000 mg via INTRAVENOUS
  Filled 2019-03-11 (×6): qty 200

## 2019-03-11 MED ORDER — VANCOMYCIN HCL IN DEXTROSE 1-5 GM/200ML-% IV SOLN
1000.0000 mg | INTRAVENOUS | Status: AC
  Filled 2019-03-11: qty 200

## 2019-03-11 MED ORDER — ENOXAPARIN SODIUM 40 MG/0.4ML ~~LOC~~ SOLN
40.0000 mg | SUBCUTANEOUS | Status: DC
  Administered 2019-03-11: 16:00:00 40 mg via SUBCUTANEOUS
  Filled 2019-03-11 (×2): qty 0.4

## 2019-03-11 MED ORDER — ASPIRIN EC 81 MG PO TBEC
81.0000 mg | DELAYED_RELEASE_TABLET | Freq: Every day | ORAL | Status: DC
  Administered 2019-03-11 – 2019-03-13 (×3): 81 mg via ORAL
  Filled 2019-03-11 (×3): qty 1

## 2019-03-11 MED ORDER — SODIUM CHLORIDE 0.9 % IV SOLN
1000.0000 mL | INTRAVENOUS | Status: DC
  Administered 2019-03-11 – 2019-03-13 (×5): 1000 mL via INTRAVENOUS

## 2019-03-11 MED ORDER — SODIUM CHLORIDE 0.9 % IV SOLN
3.0000 g | Freq: Four times a day (QID) | INTRAVENOUS | Status: DC
  Administered 2019-03-11 – 2019-03-13 (×9): 3 g via INTRAVENOUS
  Filled 2019-03-11 (×2): qty 3
  Filled 2019-03-11: qty 8
  Filled 2019-03-11 (×4): qty 3
  Filled 2019-03-11 (×6): qty 8

## 2019-03-11 MED ORDER — VANCOMYCIN HCL IN DEXTROSE 1-5 GM/200ML-% IV SOLN
1000.0000 mg | INTRAVENOUS | Status: AC
  Administered 2019-03-11 (×2): 1000 mg via INTRAVENOUS
  Filled 2019-03-11: qty 200

## 2019-03-11 MED ORDER — ALPRAZOLAM 0.5 MG PO TABS
0.5000 mg | ORAL_TABLET | Freq: Three times a day (TID) | ORAL | Status: DC | PRN
  Administered 2019-03-12: 0.5 mg via ORAL
  Filled 2019-03-11: qty 1

## 2019-03-11 MED ORDER — CHLORHEXIDINE GLUCONATE 0.12 % MT SOLN
15.0000 mL | Freq: Two times a day (BID) | OROMUCOSAL | Status: DC
  Administered 2019-03-12 – 2019-03-13 (×3): 15 mL via OROMUCOSAL
  Filled 2019-03-11 (×2): qty 15

## 2019-03-11 MED ORDER — SODIUM CHLORIDE 0.9 % IV SOLN
500.0000 mg | Freq: Once | INTRAVENOUS | Status: AC
  Administered 2019-03-11: 05:00:00 via INTRAVENOUS
  Filled 2019-03-11: qty 500

## 2019-03-11 MED ORDER — PANTOPRAZOLE SODIUM 40 MG PO TBEC
40.0000 mg | DELAYED_RELEASE_TABLET | Freq: Two times a day (BID) | ORAL | Status: DC
  Administered 2019-03-11 – 2019-03-13 (×4): 40 mg via ORAL
  Filled 2019-03-11 (×4): qty 1

## 2019-03-11 MED ORDER — ATENOLOL 25 MG PO TABS
25.0000 mg | ORAL_TABLET | Freq: Every day | ORAL | Status: DC
  Administered 2019-03-11 – 2019-03-13 (×3): 25 mg via ORAL
  Filled 2019-03-11 (×3): qty 1

## 2019-03-11 MED ORDER — LEVOTHYROXINE SODIUM 25 MCG PO TABS
25.0000 ug | ORAL_TABLET | Freq: Every day | ORAL | Status: DC
  Administered 2019-03-11 – 2019-03-13 (×3): 25 ug via ORAL
  Filled 2019-03-11 (×3): qty 1

## 2019-03-11 MED ORDER — CHLORHEXIDINE GLUCONATE CLOTH 2 % EX PADS
6.0000 | MEDICATED_PAD | Freq: Every day | CUTANEOUS | Status: DC
  Administered 2019-03-12 – 2019-03-13 (×2): 6 via TOPICAL

## 2019-03-11 MED ORDER — SODIUM CHLORIDE 0.9 % IV SOLN
1000.0000 mL | INTRAVENOUS | Status: DC
  Administered 2019-03-11: 1000 mL via INTRAVENOUS

## 2019-03-11 MED ORDER — SODIUM CHLORIDE 0.9 % IV SOLN
1.0000 g | Freq: Once | INTRAVENOUS | Status: AC
  Administered 2019-03-11: 1 g via INTRAVENOUS
  Filled 2019-03-11: qty 10

## 2019-03-11 NOTE — Progress Notes (Signed)
03/11/2019 4:17 PM   Came to check on patient.  He is sleepy and lethargic but he is easily arousable and appropriately answers questions. Continue current treatments.  ABG ordered. Bipap ordered as needed.  I spoke to spouse by phone to update. Continue to follow.    Murvin Natal MD  How to contact the [facility] Attending or Consulting provider [place] or covering provider during after hours [place], for this patient?  1. Check the care team in [facility] and look for a) attending/consulting [facility] provider listed and b) the [facility] team listed 2. Log into [email or web address] and use [place]'s universal password to access. If you do not have the password, please contact the hospital operator. 3. Locate the [facility] provider you are looking for under [facility] and page to a number that you can be directly reached. 4. If you still have difficulty reaching the provider, please page the [facility] (Director on Call) for the Hospitalists listed on amion for assistance.

## 2019-03-11 NOTE — Progress Notes (Signed)
Pharmacy Antibiotic Note  Christian Ellison is a 59 y.o. male admitted on 03/11/2019 with pneumonia.  Pharmacy has been consulted for vancomycin dosing.  Plan: Vancomycin 1000 mg IV every hour x 2 doses for a total loading dose of 2000 mg. Followed by Vancomycin 1000 mg IV every 8 hours. Goal trough 15-20 mcg/mL. Monitor clinical progress, cultures/sensitivities, renal function, abx plan, Vancomycin levels as indicated.  [facility] pharmacist to follow up and monitor    Weight: 190 lb 0.6 oz (86.2 kg)  Temp (24hrs), Avg:98.5 F (36.9 C), Min:98.2 F (36.8 C), Max:98.7 F (37.1 C)  Recent Labs  Lab 03/10/19 1458  WBC 6.8  CREATININE 0.72    Estimated Creatinine Clearance: 111.8 mL/min (by C-G formula based on SCr of 0.72 mg/dL).    No Known Allergies  Antimicrobials this admission: 7/25 vancomycin >>  7/25 azithromycin x 1 7/25 ceftriaxone x 1  Dose adjustments this admission:  Microbiology results: 7/25 BCx: sent 7/25 UCx: sent 7/25 COVID: negative   Thank you for allowing [place] to participate in this patients care. Jens Som, PharmD 03/11/2019 3:58 AM

## 2019-03-11 NOTE — ED Triage Notes (Signed)
Pt seen here yesterday and diagnosed with pneumonia.  Per ems, pt's wife states she wasn't able to get his RX filled due to pharmacy closed.  Wife called ems due to pt's breathing "not better" and per her oxygen monitor at home Sats in the 80's.

## 2019-03-11 NOTE — ED Provider Notes (Signed)
[facility] EMERGENCY DEPARTMENT Provider Note   CSN: [id number] Arrival date & time: 03/11/19  0350    History   Chief Complaint Chief Complaint  Patient presents with   Shortness of Breath    HPI Christian Ellison is a 58 y.o. male.     Patient presents to the emergency department for evaluation of difficulty breathing.  Patient was seen in the ER earlier today and diagnosed with pneumonia.  He was sent home on antibiotics.  This evening, wife noticed that the patient was more somnolent than usual, difficult to arouse and was experiencing difficulty breathing.  He has a pulse ox at home, wife reports that his oxygen saturations were down in the 80s, called EMS.  At arrival to the ER, patient does appear somnolent, arouses to voice.  Answers questions appropriately.  He reports shortness of breath.  No chest pain.     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    Opiate abuse, continuous [facility])     Patient Active Problem List   Diagnosis Date Noted   Bronchopneumonia 11/06/2018   Acute Respiratory failure with hypoxia (2/2 Asp PNA) 11/06/2018   Recurrent aspiration pneumonia ([place])    Fever 06/09/2018   Tachycardia 06/09/2018   Hypoxia 06/09/2018   Hypotension 06/09/2018   Esophageal dysmotility/Esophageal stricture/stenosis/esophageal dysmotility with recurrent aspiration    Aspiration pneumonia of right lower lobe (HCC)    Sepsis ([place]) 05/26/2018   Pulmonary edema 02/02/2018   HCAP (healthcare-associated pneumonia) 02/02/2018   Weakness generalized 02/02/2018   Hypertension 02/02/2018   CHF (congestive heart failure) ([place]) 02/02/2018   Nausea & vomiting 02/02/2018   Dysphagia 02/02/2018   Esophageal stricture/stenosis/esophageal dysmotility with recurrent aspiration 02/02/2018     Esophageal stenosis 02/02/2018   GERD (gastroesophageal reflux disease) 02/02/2018   Polysubstance abuse ([place]) 02/02/2018   Acute respiratory failure with hypoxia (HCC)    Transaminitis    Elevated troponin    Overdose 01/02/2018   Hx of hepatitis C 01/17/2013   Depression 01/17/2013   Anxiety with depression 01/17/2013   Right knee pain 01/17/2013    Past Surgical History:  Procedure Laterality Date   BIOPSY  03/18/2018   Procedure: BIOPSY;  Surgeon: Rogene Houston, MD;  Location: [facility] ENDO SU[facility];  Service: Endoscopy;;  gastric    COLONOSCOPY N/A 11/22/2013   Dr. Laural Golden: Normal except for hemorrhoids.  Next colonoscopy 10 years.   ESOPHAGEAL DILATION N/A 03/18/2018   Procedure: ESOPHAGEAL DILATION;  Surgeon: Rogene Houston, MD;  Location: [facility] ENDO SUITE;  Service: Endoscopy;  Laterality: N/A;   ESOPHAGEAL MANOMETRY N/A 06/15/2018   Procedure: ESOPHAGEAL MANOMETRY (EM);  Surgeon: Mauri Pole, MD;  Location: [facility] ENDOSCOPY;  Service: Endoscopy;  Laterality: N/A;   ESOPHAGOGASTRODUODENOSCOPY (EGD) WITH PROPOFOL N/A 03/18/2018   Dr. Laural Golden: Benign-appearing mild esophageal stenosis proximal to the GE J, status post dilation.  2 cm hiatal hernia.  Gastritis, biopsies negative for H. pylori   Fracture Right Leg     Patient has rod/screws in this leg   Rotor Cuff  2012   Right Shoulder        Home Medications    Prior to Admission medications   Medication Sig Start Date End Date Taking? Authorizing Provider  albuterol (PROVENTIL HFA;VENTOLIN HFA) 108 (90 Base) MCG/ACT  inhaler Inhale 2 puffs into the lungs every 6 (six) hours as needed for wheezing or shortness of breath. 08/22/18   Rodriguez-Southworth, Nettie ElmSylvia, PA-C  ALPRAZolam Prudy Feeler(XANAX) 0.5 MG tablet Take 0.5 mg by mouth 2 (two) times daily.    [provider]  amoxicillin (AMOXIL) 500 MG capsule Take 1 capsule (500 mg total) by mouth 3 (three) times daily. 03/10/19   Fayrene Helperran, [place], PA-C  aspirin EC 81 MG  tablet Take 81 mg by mouth daily.    [provider]  atenolol (TENORMIN) 25 MG tablet Take 25 mg by mouth daily.    [provider]  azithromycin (ZITHROMAX Z-PAK) 250 MG tablet 2 po day one, then 1 daily x 4 days 03/10/19   Fayrene Helperran, [place], PA-C  dexamethasone (DECADRON) 1 MG tablet Take 2 tablets (2 mg total) by mouth every 12 (twelve) hours. X 7 days.  Then 1 tablet (1 mg) every 12 hours x 7 days.  Then 1 tablet (1 mg) once daily x 7 days. 02/15/19   Catarina Hartshornat, David, MD  levothyroxine (SYNTHROID, LEVOTHROID) 25 MCG tablet Take 25 mcg by mouth daily. 05/13/18   [provider]  Multiple Vitamin (MULTIVITAMIN WITH MINERALS) TABS tablet Take 1 tablet by mouth daily. 50+    [provider]  ondansetron (ZOFRAN ODT) 4 MG disintegrating tablet Take 1 tablet (4 mg total) by mouth every 8 (eight) hours as needed for nausea or vomiting. 02/13/19   Horton, Mayer Maskerourtney F, MD  pantoprazole (PROTONIX) 40 MG tablet Take 1 tablet (40 mg total) by mouth 2 (two) times daily. 08/22/18 02/13/19  Rodriguez-Southworth, Nettie ElmSylvia, PA-C    Family History Family History  Problem Relation Age of Onset   Breast cancer Mother    Colon cancer Father    Bladder Cancer Father    Prostate cancer Father    Hypertension Sister    Hypothyroidism Sister    Healthy Sister    Healthy Daughter    Healthy Son    Healthy Son     Social History Social History   Tobacco Use   Smoking status: Never Smoker   Smokeless tobacco: Never Used  Substance Use Topics   Alcohol use: No    Comment: Patient states that it has been over a year   Drug use: Yes    Types: Benzodiazepines    Comment: opiates     Allergies   Patient has no known allergies.   Review of Systems Review of Systems  Respiratory: Positive for shortness of breath.   All other systems reviewed and are negative.    Physical Exam Updated Vital Signs BP (!) 198/107 (BP Location: Left Arm)    Pulse (!) 108    Temp 98.7 F  (37.1 C) (Oral)    Resp (!) 31    Wt 86.2 kg    SpO2 (!) 85%    BMI 25.77 kg/m   Physical Exam Vitals signs and nursing note reviewed.  Constitutional:      General: He is not in acute distress.    Appearance: Normal appearance. He is well-developed.  HENT:     Head: Normocephalic and atraumatic.     Right Ear: Hearing normal.     Left Ear: Hearing normal.     Nose: Nose normal.  Eyes:     Conjunctiva/sclera: Conjunctivae normal.     Pupils: Pupils are equal, round, and reactive to light.  Neck:     Musculoskeletal: Normal range of motion and neck supple.  Cardiovascular:  Rate and Rhythm: Regular rhythm. Tachycardia present.     Heart sounds: S1 normal and S2 normal. No murmur. No friction rub. No gallop.   Pulmonary:     Effort: Pulmonary effort is normal. Tachypnea present. No respiratory distress.     Breath sounds: Rhonchi present.  Chest:     Chest wall: No tenderness.  Abdominal:     General: Bowel sounds are normal.     Palpations: Abdomen is soft.     Tenderness: There is no abdominal tenderness. There is no guarding or rebound. Negative signs include Murphy's sign and McBurney's sign.     Hernia: No hernia is present.  Musculoskeletal: Normal range of motion.  Skin:    General: Skin is warm and dry.     Findings: No rash.  Neurological:     Mental Status: He is alert and oriented to person, place, and time.     GCS: GCS eye subscore is 4. GCS verbal subscore is 5. GCS motor subscore is 6.     Cranial Nerves: No cranial nerve deficit.     Sensory: No sensory deficit.     Coordination: Coordination normal.  Psychiatric:        Speech: Speech normal.        Behavior: Behavior normal.        Thought Content: Thought content normal.      ED Treatments / Results  Labs (all labs ordered are listed, but only abnormal results are displayed) Labs Reviewed  CULTURE, BLOOD (ROUTINE X 2)  CULTURE, BLOOD (ROUTINE X 2)  URINE CULTURE  SARS CORONAVIRUS 2  (HOSPITAL ORDER, PERFORMED IN [facility])  LACTIC ACID, PLASMA  LACTIC ACID, PLASMA  COMPREHENSIVE METABOLIC PANEL  CBC WITH DIFFERENTIAL/PLATELET  APTT  PROTIME-INR  URINALYSIS, ROUTINE W REFLEX MICROSCOPIC  BLOOD GAS, ARTERIAL    EKG EKG Interpretation  Date/Time:  Saturday March 11 2019 03:39:11 EDT Ventricular Rate:  108 PR Interval:    QRS Duration: 81 QT Interval:  311 QTC Calculation: 417 R Axis:   -28 Text Interpretation:  Sinus tachycardia Borderline left axis deviation Borderline T abnormalities, lateral leads Confirmed by Gilda CreasePollina, Jerusalen Mateja J [phone]) on 03/11/2019 3:46:06 AM   Radiology Ct Angio Chest Pe W And/or Wo Contrast  Result Date: 03/10/2019 CLINICAL DATA:  Shortness of breath, fatigue EXAM: CT ANGIOGRAPHY CHEST WITH CONTRAST TECHNIQUE: Multidetector CT imaging of the chest was performed using the standard protocol during bolus administration of intravenous contrast. Multiplanar CT image reconstructions and MIPs were obtained to evaluate the vascular anatomy. CONTRAST:  75mL OMNIPAQUE IOHEXOL 350 MG/ML SOLN COMPARISON:  02/13/2019 FINDINGS: Cardiovascular: Satisfactory opacification of the pulmonary arteries to the segmental level. No evidence of pulmonary embolism. Normal heart size. No pericardial effusion. Mediastinum/Nodes: No enlarged mediastinal, hilar, or axillary lymph nodes. Patulous esophagus. Thyroid gland and trachea demonstrate no significant findings. Lungs/Pleura: There are numerous clustered small nodular and ground-glass opacities bilaterally, most conspicuous in the right lower lobe (series 6, image 102). No pleural effusion or pneumothorax. Upper Abdomen: No acute abnormality. Musculoskeletal: No chest wall abnormality. No acute or significant osseous findings. Review of the MIP images confirms the above findings. IMPRESSION: 1.  Negative examination for pulmonary embolism. 2. There are numerous clustered small nodular and ground-glass  opacities bilaterally, most conspicuous in the right lower lobe (series 6, image 102). Findings are consistent with multifocal atypical infection. Electronically Signed   By: Lauralyn PrimesAlex  Bibbey M.D.   On: 03/10/2019 17:11   Dg Chest Portable  1 View  Result Date: 03/10/2019 CLINICAL DATA:  FATIGUE X COUPLE OF DAYS/HX PNEUMONIA/NON SMOKER EXAM: PORTABLE CHEST 1 VIEW COMPARISON:  CT angio chest 02/13/2019, portable chest x-ray on 02/13/2019 FINDINGS: Heart size is normal. The lungs are free of focal consolidations and pleural effusions. No pulmonary edema. IMPRESSION: No active disease. Electronically Signed   By: Norva PavlovElizabeth  Brown M.D.   On: 03/10/2019 15:14    Procedures Procedures (including critical care time)  Medications Ordered in ED Medications  0.9 %  sodium chloride infusion (has no administration in time range)  cefTRIAXone (ROCEPHIN) 1 g in sodium chloride 0.9 % 100 mL IVPB (has no administration in time range)  azithromycin (ZITHROMAX) 500 mg in sodium chloride 0.9 % 250 mL IVPB (has no administration in time range)     Initial Impression / Assessment and Plan / ED Course  I have reviewed the triage vital signs and the nursing notes.  Pertinent labs & imaging results that were available during my care of the patient were reviewed by me and considered in my medical decision making (see chart for details).        Patient presents to the emergency department for evaluation of difficulty breathing.  Patient seen in the ER earlier and diagnosed with pneumonia.  Records reviewed.  Patient had CT angiography that showed no evidence of PE but did have multiple areas of groundglass opacity consistent with pneumonia, possibly atypical infection.  His oxygen saturations were essentially normal in the ER earlier today, but at arrival he is 84 to 85% on 4 L by nasal cannula.  This represents a significant oxygen demand which is new for him, will require hospitalization.  Patient's vital signs review  shows that he is tachypneic and tachycardic, possibly septic.  No evidence of septic shock.  CRITICAL CARE Performed by: Gilda Creasehristopher J Olon Russ   Total critical care time: 35 minutes  Critical care time was exclusive of separately billable procedures and treating other patients.  Critical care was necessary to treat or prevent imminent or life-threatening deterioration.  Critical care was time spent personally by me on the following activities: development of treatment plan with patient and/or surrogate as well as nursing, discussions with consultants, evaluation of patient's response to treatment, examination of patient, obtaining history from patient or surrogate, ordering and performing treatments and interventions, ordering and review of laboratory studies, ordering and review of radiographic studies, pulse oximetry and re-evaluation of patient's condition.   Final Clinical Impressions(s) / ED Diagnoses   Final diagnoses:  Pneumonia of both lungs due to infectious organism, unspecified part of lung  Sepsis without acute organ dysfunction, due to unspecified organism [facility](HCC)    ED Discharge Orders    None       Gilda CreasePollina, Eshawn Coor J, MD 03/11/19 [phone]
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
## 2019-03-11 NOTE — Progress Notes (Signed)
Patient's significant other continuously keeps calling patient as well as ICU desk phone. Patient very somnolent but when aroused, told him significant other was on the phone and asked did he want to speak to her. Stated he would call her later. Education provided to significant other on importance of letting patient rest due to his sickness. Significant other stated she can't wait and it is about their son. Patient resting well with no complaints. Will continue to monitor and answer any questions from significant other.

## 2019-03-12 DIAGNOSIS — J9601 Acute respiratory failure with hypoxia: Secondary | ICD-10-CM

## 2019-03-12 LAB — CBC WITH DIFFERENTIAL/PLATELET
Abs Immature Granulocytes: 0.03 10*3/uL (ref 0.00–0.07)
Basophils Absolute: 0 10*3/uL (ref 0.0–0.1)
Basophils Relative: 0 %
Eosinophils Absolute: 0.2 10*3/uL (ref 0.0–0.5)
Eosinophils Relative: 2 %
HCT: 36.6 % — ABNORMAL LOW (ref 39.0–52.0)
Hemoglobin: 11.7 g/dL — ABNORMAL LOW (ref 13.0–17.0)
Immature Granulocytes: 0 %
Lymphocytes Relative: 16 %
Lymphs Abs: 1.2 10*3/uL (ref 0.7–4.0)
MCH: 30.7 pg (ref 26.0–34.0)
MCHC: 32 g/dL (ref 30.0–36.0)
MCV: 96.1 fL (ref 80.0–100.0)
Monocytes Absolute: 0.5 10*3/uL (ref 0.1–1.0)
Monocytes Relative: 7 %
Neutro Abs: 5.6 10*3/uL (ref 1.7–7.7)
Neutrophils Relative %: 75 %
Platelets: 147 10*3/uL — ABNORMAL LOW (ref 150–400)
RBC: 3.81 MIL/uL — ABNORMAL LOW (ref 4.22–5.81)
RDW: 13.1 % (ref 11.5–15.5)
WBC: 7.5 10*3/uL (ref 4.0–10.5)
nRBC: 0 % (ref 0.0–0.2)

## 2019-03-12 LAB — LEGIONELLA PNEUMOPHILA SEROGP 1 UR AG: L. pneumophila Serogp 1 Ur Ag: NEGATIVE

## 2019-03-12 LAB — COMPREHENSIVE METABOLIC PANEL
ALT: 19 U/L (ref 0–44)
AST: 14 U/L — ABNORMAL LOW (ref 15–41)
Albumin: 3.1 g/dL — ABNORMAL LOW (ref 3.5–5.0)
Alkaline Phosphatase: 44 U/L (ref 38–126)
Anion gap: 7 (ref 5–15)
BUN: 8 mg/dL (ref 6–20)
CO2: 30 mmol/L (ref 22–32)
Calcium: 8.5 mg/dL — ABNORMAL LOW (ref 8.9–10.3)
Chloride: 102 mmol/L (ref 98–111)
Creatinine, Ser: 0.55 mg/dL — ABNORMAL LOW (ref 0.61–1.24)
GFR calc Af Amer: >60 mL/min (ref >60)
GFR calc non Af Amer: >60 mL/min (ref >60)
Glucose, Bld: 115 mg/dL — ABNORMAL HIGH (ref 70–99)
Potassium: 3.8 mmol/L (ref 3.5–5.1)
Sodium: 139 mmol/L (ref 135–145)
Total Bilirubin: 1 mg/dL (ref 0.3–1.2)
Total Protein: 6.3 g/dL — ABNORMAL LOW (ref 6.5–8.1)

## 2019-03-12 LAB — BLOOD GAS, ARTERIAL
Acid-Base Excess: 6.2 mmol/L — ABNORMAL HIGH (ref 0.0–2.0)
Bicarbonate: 28.5 mmol/L — ABNORMAL HIGH (ref 20.0–28.0)
FIO2: 40
O2 Saturation: 97.6 %
Patient temperature: 37
pCO2 arterial: 61.2 mmHg — ABNORMAL HIGH (ref 32.0–48.0)
pH, Arterial: 7.336 — ABNORMAL LOW (ref 7.350–7.450)
pO2, Arterial: 100 mmHg (ref 83.0–108.0)

## 2019-03-12 LAB — URINE CULTURE: Culture: NO GROWTH

## 2019-03-12 LAB — HIV ANTIBODY (ROUTINE TESTING W REFLEX): HIV Screen 4th Generation wRfx: NONREACTIVE

## 2019-03-12 NOTE — Progress Notes (Signed)
Patient Demographics:    Christian Ellison, is a 58 y.o. male, DOB - July 08, 1961, QIO:[id number]  Admit date - 03/11/2019   Admitting Physician Murlean Iba, MD  Outpatient Primary MD for the patient is [facility]  LOS - 1   Chief Complaint  Patient presents with   Shortness of Breath        Subjective:    Christian Ellison today has no fevers, no emesis,  No chest pain, cough and hypoxia persist able to come off BiPAP currently on 3 L of oxygen via nasal cannula  Assessment  & Plan :    Principal Problem:   Acute respiratory failure with hypoxemia (HCC) Active Problems:   Hx of hepatitis C   Anxiety with depression   Weakness generalized   CHF (congestive heart failure) (HCC)   Esophageal stricture/stenosis/esophageal dysmotility with recurrent aspiration   GERD (gastroesophageal reflux disease)   Polysubstance abuse ([place])   Multifocal pneumonia   Sinus tachycardia   Sepsis due to pneumonia [facility])   Hypothyroidism  Brief Summary 58 y.o. male with past medical history relevant for hypothyroidism, HTN,  history of hep C,  h/o esophageal spasms and strictures/stenosis with history of recurrent aspiration pneumonia in the past requiring intubation and ventilation--as well as history of chronic pain and anxiety with opiate, THC,  Amphetamine and benzo abuse admitted on 03/11/2019 with acute hypoxic respiratory failure due to presumed aspiration pneumonia with CT chest from 03/10/2019 suggesting multifocal pneumonia, please note patient had a positive MRSA screen on 02/13/2019   A/p  1)Sepsis secondary to CAP/ multifocal pneumonia--- suspect aspiration related, continue Unasyn, continue bronchodilators -cough and hypoxia persist able to come off BiPAP currently on 3 L of oxygen via nasal cannula -CoVID Neg -had a positive MRSA screen on 02/13/2019 -Currently on IV  vancomycin consider de-escalating in a.m. --Patient met sepsis criteria on admission with tachycardia, tachypnea, hypotension and hypoxia in the setting of multifocal pneumonia  2)H/o Esophagealdysmotility/strictures status post dilations with recurrent aspiration---- -patient reminded to follow-up as outpatient for GI consult with Dr. Devonne Doughty previously had EGD with dilatation with Dr. Laural Golden  3)Hypothyroidism=----stable, TSH was 0.4 months ago, continue levothyroxine  4)History of hepatitis C- -Complete therapy with Harvoni -Continue follow-up with gastroenterology service as an outpatient.  5)HxChronicPain/Depression/Anxiety andPolysubstance dependence -- Previously documented opioid, THC , Amphetamine and benzodiazepine dependence--- patient not interested in rehab, patient states he is currently not abusing drugs ------be judicious with opiates and benzos,  6)Acute Hypoxic Respiratory Failure----secondary to presumed aspiration pneumonia treat as above #1  Disposition/Need for in-Hospital Stay- patient unable to be discharged at this time due to hypoxia, hypotension secondary to sepsis from pneumonia--requiring submental oxygen and IV antibiotics and IV fluids*  Code Status : Full code  Family Communication:   NA (patient is alert, awake and coherent)   Disposition Plan  : TBD  Consults  :  Na  DVT Prophylaxis  :  Lovenox - SCDs   Lab Results  Component Value Date   PLT 147 (L) 03/12/2019    Inpatient Medications  Scheduled Meds:  aspirin EC  81 mg Oral Daily   atenolol  25 mg Oral Daily   chlorhexidine  15 mL Mouth Rinse BID   Chlorhexidine Gluconate  Cloth  6 each Topical Daily   enoxaparin (LOVENOX) injection  40 mg Subcutaneous Q24H   levothyroxine  25 mcg Oral Daily   mouth rinse  15 mL Mouth Rinse q12n4p   pantoprazole  40 mg Oral BID   Continuous Infusions:  sodium chloride 1,000 mL (03/12/19 1245)   ampicillin-sulbactam (UNASYN) IV 3  g (03/12/19 1143)   vancomycin 1,000 mg (03/12/19 1500)   PRN Meds:.ALPRAZolam    Anti-infectives (From admission, onward)   Start     Dose/Rate Route Frequency Ordered Stop   03/11/19 1530  vancomycin (VANCOCIN) IVPB 1000 mg/200 mL premix     1,000 mg 200 mL/hr over 60 Minutes Intravenous Every 8 hours 03/11/19 0546     03/11/19 1200  Ampicillin-Sulbactam (UNASYN) 3 g in sodium chloride 0.9 % 100 mL IVPB     3 g 200 mL/hr over 30 Minutes Intravenous Every 6 hours 03/11/19 0923     03/11/19 0630  vancomycin (VANCOCIN) IVPB 1000 mg/200 mL premix     1,000 mg 200 mL/hr over 60 Minutes Intravenous Every 1 hr x 2 03/11/19 0544 03/11/19 0903   03/11/19 0400  vancomycin (VANCOCIN) IVPB 1000 mg/200 mL premix     1,000 mg 200 mL/hr over 60 Minutes Intravenous Every 1 hr x 2 03/11/19 0357 03/11/19 0559   03/11/19 0345  cefTRIAXone (ROCEPHIN) 1 g in sodium chloride 0.9 % 100 mL IVPB     1 g 200 mL/hr over 30 Minutes Intravenous  Once 03/11/19 0344 03/11/19 0511   03/11/19 0345  azithromycin (ZITHROMAX) 500 mg in sodium chloride 0.9 % 250 mL IVPB     500 mg 250 mL/hr over 60 Minutes Intravenous  Once 03/11/19 0344 03/11/19 0705        Objective:   Vitals:   03/12/19 0800 03/12/19 0900 03/12/19 1000 03/12/19 1120  BP: 127/67 (!) 84/53 123/69   Pulse: 79 87 82   Resp: '16 16 13   ' Temp:    98.7 F (37.1 C)  TempSrc:    Axillary  SpO2: 94% 94% 95%   Weight:      Height:        Wt Readings from Last 3 Encounters:  03/11/19 82.8 kg  03/10/19 86.2 kg  02/15/19 94.3 kg     Intake/Output Summary (Last 24 hours) at 03/12/2019 1518 Last data filed at 03/12/2019 1323 Gross per 24 hour  Intake 3487.78 ml  Output 2720 ml  Net 767.78 ml     Physical Exam  Gen:- Awake Alert, able to speak in sentences at rest, does have dyspnea on exertion HEENT:- [place].AT, No sclera icterus Nose- 3L/min Neck-Supple Neck,No JVD,.  Lungs-diminished in bases with scattered rhonchi bilaterally CV-  S1, S2 normal, regular  Abd-  +ve B.Sounds, Abd Soft, No tenderness,    Extremity/Skin:- No  edema, pedal pulses present  Psych-affect is appropriate, oriented x3 Neuro-no new focal deficits, no tremors   Data Review:   Micro Results Recent Results (from the past 240 hour(s))  SARS Coronavirus 2 ([facility]- Performed in [place] hospital lab), Hosp Order     Status: None   Collection Time: 03/10/19  3:00 PM   Specimen: Nasopharyngeal Swab  Result Value Ref Range Status   SARS Coronavirus 2 NEGATIVE NEGATIVE Final    Comment: (NOTE) If result is NEGATIVE SARS-CoV-2 target nucleic acids are NOT DETECTED. The SARS-CoV-2 RNA is generally detectable in upper and lower  respiratory specimens during the acute phase of infection. The lowest  concentration of SARS-CoV-2 viral copies this assay can detect is 250  copies / mL. A negative result does not preclude SARS-CoV-2 infection  and should not be used as the sole basis for treatment or other  patient management decisions.  A negative result may occur with  improper specimen collection / handling, submission of specimen other  than nasopharyngeal swab, presence of viral mutation(s) within the  areas targeted by this assay, and inadequate number of viral copies  (<250 copies / mL). A negative result must be combined with clinical  observations, patient history, and epidemiological information. If result is POSITIVE SARS-CoV-2 target nucleic acids are DETECTED. The SARS-CoV-2 RNA is generally detectable in upper and lower  respiratory specimens dur ing the acute phase of infection.  Positive  results are indicative of active infection with SARS-CoV-2.  Clinical  correlation with patient history and other diagnostic information is  necessary to determine patient infection status.  Positive results do  not rule out bacterial infection or co-infection with other viruses. If result is PRESUMPTIVE POSTIVE SARS-CoV-2 nucleic acids MAY BE  PRESENT.   A presumptive positive result was obtained on the submitted specimen  and confirmed on repeat testing.  While 2019 novel coronavirus  (SARS-CoV-2) nucleic acids may be present in the submitted sample  additional confirmatory testing may be necessary for epidemiological  and / or clinical management purposes  to differentiate between  SARS-CoV-2 and other Sarbecovirus currently known to infect humans.  If clinically indicated additional testing with an alternate test  methodology [phone]) is advised. The SARS-CoV-2 RNA is generally  detectable in upper and lower respiratory sp ecimens during the acute  phase of infection. The expected result is Negative. Fact Sheet for Patients:  StrictlyIdeas.no Fact Sheet for Healthcare Providers: BankingDealers.co.za This test is not yet approved or cleared by the [place] FDA and has been authorized for detection and/or diagnosis of SARS-CoV-2 by FDA under an Emergency Use Authorization (EUA).  This EUA will remain in effect (meaning this test can be used) for the duration of the COVID-19 declaration under Section 564(b)(1) of the Act, 21 U.S.C. section 360bbb-3(b)(1), unless the authorization is terminated or revoked sooner. Performed at [facility], [address] [place], [place] [address]   SARS Coronavirus 2 ([place] - Performed in [facility] hospital lab), Hosp Order     Status: None   Collection Time: 03/11/19  3:42 AM   Specimen: Nasopharyngeal Swab  Result Value Ref Range Status   SARS Coronavirus 2 NEGATIVE NEGATIVE Final    Comment: (NOTE) If result is NEGATIVE SARS-CoV-2 target nucleic acids are NOT DETECTED. The SARS-CoV-2 RNA is generally detectable in upper and lower  respiratory specimens during the acute phase of infection. The lowest  concentration of SARS-CoV-2 viral copies this assay can detect is 250  copies / mL. A negative result does not preclude SARS-CoV-2  infection  and should not be used as the sole basis for treatment or other  patient management decisions.  A negative result may occur with  improper specimen collection / handling, submission of specimen other  than nasopharyngeal swab, presence of viral mutation(s) within the  areas targeted by this assay, and inadequate number of viral copies  (<250 copies / mL). A negative result must be combined with clinical  observations, patient history, and epidemiological information. If result is POSITIVE SARS-CoV-2 target nucleic acids are DETECTED. The SARS-CoV-2 RNA is generally detectable in upper and lower  respiratory specimens dur ing the acute phase of  infection.  Positive  results are indicative of active infection with SARS-CoV-2.  Clinical  correlation with patient history and other diagnostic information is  necessary to determine patient infection status.  Positive results do  not rule out bacterial infection or co-infection with other viruses. If result is PRESUMPTIVE POSTIVE SARS-CoV-2 nucleic acids MAY BE PRESENT.   A presumptive positive result was obtained on the submitted specimen  and confirmed on repeat testing.  While 2019 novel coronavirus  (SARS-CoV-2) nucleic acids may be present in the submitted sample  additional confirmatory testing may be necessary for epidemiological  and / or clinical management purposes  to differentiate between  SARS-CoV-2 and other Sarbecovirus currently known to infect humans.  If clinically indicated additional testing with an alternate test  methodology [phone]) is advised. The SARS-CoV-2 RNA is generally  detectable in upper and lower respiratory sp ecimens during the acute  phase of infection. The expected result is Negative. Fact Sheet for Patients:  StrictlyIdeas.no Fact Sheet for Healthcare Providers: BankingDealers.co.za This test is not yet approved or cleared by the [place]  FDA and has been authorized for detection and/or diagnosis of SARS-CoV-2 by FDA under an Emergency Use Authorization (EUA).  This EUA will remain in effect (meaning this test can be used) for the duration of the COVID-19 declaration under Section 564(b)(1) of the Act, 21 U.S.C. section 360bbb-3(b)(1), unless the authorization is terminated or revoked sooner. Performed at [facility], [address] [place], [place] [address]   Blood Culture (routine x 2)     Status: None (Preliminary result)   Collection Time: 03/11/19  4:54 AM   Specimen: Left Antecubital; Blood  Result Value Ref Range Status   Specimen Description LEFT ANTECUBITAL  Final   Special Requests   Final    BOTTLES DRAWN AEROBIC AND ANAEROBIC Blood Culture adequate volume   Culture   Final    NO GROWTH 1 DAY Performed at [facility], [address], [place], [place] [address]    Report Status PENDING  Incomplete  Blood Culture (routine x 2)     Status: None (Preliminary result)   Collection Time: 03/11/19  5:09 AM   Specimen: BLOOD RIGHT HAND  Result Value Ref Range Status   Specimen Description BLOOD RIGHT HAND  Final   Special Requests   Final    BOTTLES DRAWN AEROBIC AND ANAEROBIC Blood Culture adequate volume   Culture   Final    NO GROWTH 1 DAY Performed at [facility], [address], [place], [place] [address]    Report Status PENDING  Incomplete  Urine culture     Status: None   Collection Time: 03/11/19  8:21 AM   Specimen: Urine, Clean Catch  Result Value Ref Range Status   Specimen Description   Final    URINE, CLEAN CATCH Performed at [facility], [address], [place], [place] [address]    Special Requests   Final    NONE Performed at [facility], [address], [place], [place] [address]    Culture   Final    NO GROWTH Performed at [facility], [place] [address], [place],  [address]    Report Status 03/12/2019 FINAL  Final  MRSA PCR Screening     Status: None   Collection  Time: 03/11/19  9:33 AM   Specimen: Nasal Mucosa; Nasopharyngeal  Result Value Ref Range Status   MRSA by PCR NEGATIVE NEGATIVE Final    Comment:  The GeneXpert MRSA Assay (FDA approved for NASAL specimens only), is one component of a comprehensive MRSA colonization surveillance program. It is not intended to diagnose MRSA infection nor to guide or monitor treatment for MRSA infections. Performed at [facility], [address] [place], [place] [address]     Radiology Reports Ct Angio Chest Pe W And/or Wo Contrast  Result Date: 03/10/2019 CLINICAL DATA:  Shortness of breath, fatigue EXAM: CT ANGIOGRAPHY CHEST WITH CONTRAST TECHNIQUE: Multidetector CT imaging of the chest was performed using the standard protocol during bolus administration of intravenous contrast. Multiplanar CT image reconstructions and MIPs were obtained to evaluate the vascular anatomy. CONTRAST:  51m OMNIPAQUE IOHEXOL 350 MG/ML SOLN COMPARISON:  02/13/2019 FINDINGS: Cardiovascular: Satisfactory opacification of the pulmonary arteries to the segmental level. No evidence of pulmonary embolism. Normal heart size. No pericardial effusion. Mediastinum/Nodes: No enlarged mediastinal, hilar, or axillary lymph nodes. Patulous esophagus. Thyroid gland and trachea demonstrate no significant findings. Lungs/Pleura: There are numerous clustered small nodular and ground-glass opacities bilaterally, most conspicuous in the right lower lobe (series 6, image 102). No pleural effusion or pneumothorax. Upper Abdomen: No acute abnormality. Musculoskeletal: No chest wall abnormality. No acute or significant osseous findings. Review of the MIP images confirms the above findings. IMPRESSION: 1.  Negative examination for pulmonary embolism. 2. There are numerous clustered small nodular and ground-glass opacities bilaterally, most conspicuous in the right lower lobe (series 6, image 102). Findings are consistent with multifocal atypical  infection. Electronically Signed   By: AEddie CandleM.D.   On: 03/10/2019 17:11   Ct Angio Chest Pe W And/or Wo Contrast  Result Date: 02/13/2019 CLINICAL DATA:  Dry cough and shortness of breath EXAM: CT ANGIOGRAPHY CHEST WITH CONTRAST TECHNIQUE: Multidetector CT imaging of the chest was performed using the standard protocol during bolus administration of intravenous contrast. Multiplanar CT image reconstructions and MIPs were obtained to evaluate the vascular anatomy. CONTRAST:  1073mOMNIPAQUE 350 COMPARISON:  Chest x-ray from earlier in the same day, CT from 06/09/2018. FINDINGS: Cardiovascular: Thoracic aorta is within normal limits with only minimal atherosclerotic calcifications identified. No coronary calcifications are seen. The pulmonary artery shows a normal branching pattern without focal filling defect to suggest pulmonary embolism. Mediastinum/Nodes: Thoracic inlet is within normal limits. The esophagus demonstrates fluid within which would correspond with the patient's given clinical history of reflux and aspiration. No significant lymphadenopathy is identified. Lungs/Pleura: Lungs are well aerated bilaterally. Very minimal atelectatic changes are noted right greater than left. No focal parenchymal nodule is seen. No focal confluent infiltrate is identified. Previously seen nodular densities have resolved in the interval. Upper Abdomen: Visualized upper abdomen demonstrates some mild inflammatory changes surrounding the adrenal glands and celiac axis. A portion of this may be related to the patient's underlying vomiting. CT of the abdomen and pelvis may be helpful for further evaluation. Musculoskeletal: Degenerative changes of the thoracic spine are noted. No acute rib abnormality is seen. Review of the MIP images confirms the above findings. IMPRESSION: No evidence of pulmonary emboli. Mild bibasilar atelectasis. No findings to suggest aspiration pneumonia are noted. Fluid within the esophagus  consistent with the patient's given clinical history of reflux. Hazy inflammatory changes surrounding the adrenal glands within the upper abdominal fat. Possibility of underlying pancreatitis would deserve consideration although this may be related to recent vomiting episodes. CT of the abdomen and pelvis and correlation with lab values may be helpful. Aortic Atherosclerosis (ICD10-I70.0). Electronically Signed   By: MaLinus Mako.  On: 02/13/2019 16:11   Ct Abdomen Pelvis W Contrast  Result Date: 02/14/2019 CLINICAL DATA:  Vomiting.  Abnormal CT scan. EXAM: CT ABDOMEN AND PELVIS WITH CONTRAST TECHNIQUE: Multidetector CT imaging of the abdomen and pelvis was performed using the standard protocol following bolus administration of intravenous contrast. CONTRAST:  33m OMNIPAQUE IOHEXOL 300 MG/ML SOLN, 1055mOMNIPAQUE IOHEXOL 300 MG/ML SOLN COMPARISON:  CT scan of February 13, 2019. FINDINGS: Lower chest: No acute abnormality. Hepatobiliary: No cholelithiasis or biliary dilatation is noted. The liver is unremarkable. There does appear to be significant gallbladder wall thickening present. Pancreas: Unremarkable. No pancreatic ductal dilatation or surrounding inflammatory changes. Spleen: Normal in size without focal abnormality. Adrenals/Urinary Tract: Adrenal glands are unremarkable. Kidneys are normal except for minimal perirenal stranding bilaterally, without renal calculi, focal lesion, or hydronephrosis. Bladder is unremarkable. Stomach/Bowel: Stomach is within normal limits. Appendix appears normal. No evidence of bowel wall thickening, distention, or inflammatory changes. Vascular/Lymphatic: No significant vascular findings are present. No enlarged abdominal or pelvic lymph nodes. Reproductive: Prostate is unremarkable. Other: No abdominal wall hernia or abnormality. Minimal amount of free fluid is noted in the pelvis. Musculoskeletal: No acute or significant osseous findings. IMPRESSION: Moderate  gallbladder wall thickening is noted concerning for possible cholecystitis. No cholelithiasis is noted. Ultrasound is recommended for further evaluation. Minimal bilateral perirenal stranding is noted of uncertain significance. No hydronephrosis or renal obstruction is noted. Minimal amount of free fluid is noted in the pelvis. Electronically Signed   By: JaMarijo Conception.D.   On: 02/14/2019 12:33   Dg Chest Port 1 View  Result Date: 03/11/2019 CLINICAL DATA:  Shortness of breath EXAM: PORTABLE CHEST 1 VIEW COMPARISON:  03/10/2019 FINDINGS: The heart size and mediastinal contours are within normal limits. Both lungs are clear. The visualized skeletal structures are unremarkable. IMPRESSION: No active disease. Electronically Signed   By: KeUlyses Jarred.D.   On: 03/11/2019 04:12   Dg Chest Portable 1 View  Result Date: 03/10/2019 CLINICAL DATA:  FATIGUE X COUPLE OF DAYS/HX PNEUMONIA/NON SMOKER EXAM: PORTABLE CHEST 1 VIEW COMPARISON:  CT angio chest 02/13/2019, portable chest x-ray on 02/13/2019 FINDINGS: Heart size is normal. The lungs are free of focal consolidations and pleural effusions. No pulmonary edema. IMPRESSION: No active disease. Electronically Signed   By: ElNolon Nations.D.   On: 03/10/2019 15:14   Dg Chest Portable 1 View  Result Date: 02/13/2019 CLINICAL DATA:  Productive cough and shortness of breath EXAM: PORTABLE CHEST 1 VIEW COMPARISON:  02/13/2019 FINDINGS: Cardiac shadow is within normal limits. Lungs are well aerated bilaterally. No focal infiltrate is seen. Interval clearing in the left retrocardiac region is noted consistent with previous atelectasis. No bony abnormality is noted. IMPRESSION: No active disease. Electronically Signed   By: MaInez Catalina.D.   On: 02/13/2019 13:15   Dg Chest Portable 1 View  Result Date: 02/13/2019 CLINICAL DATA:  Cough and fever. EXAM: PORTABLE CHEST 1 VIEW COMPARISON:  November 06, 2018 FINDINGS: The previously noted right lower lobe pneumonia  has significantly improved. There is a persistent retrocardiac opacity. There is no pneumothorax. No large pleural effusion. No acute osseous abnormality. IMPRESSION: 1. Significant interval improvement in the right lower lobe pneumonia. 2. Persistent faint retrocardiac opacity which may represent atelectasis or persistent infiltrate. 3. No definite radiographic evidence for recurring aspiration pneumonia. Electronically Signed   By: ChConstance Holster.D.   On: 02/13/2019 01:02   UsKoreabdomen Limited Ruq  Result Date: 02/15/2019 CLINICAL DATA:  Abdominal pain, hepatitis-C virus with treatment EXAM: ULTRASOUND ABDOMEN LIMITED RIGHT UPPER QUADRANT COMPARISON:  CT abdomen and pelvis 02/14/2019 FINDINGS: Gallbladder: Normally distended without stones. Upper normal wall thickness. No pericholecystic fluid or sonographic Murphy sign. Common bile duct: Diameter: Normal caliber Liver: Echogenic parenchyma, likely fatty infiltration though this can be seen with cirrhosis and certain infiltrative disorders. No focal hepatic mass or nodularity. Portal vein is patent on color Doppler imaging with normal direction of blood flow towards the liver. No RIGHT upper quadrant free fluid. IMPRESSION: Upper normal gallbladder wall thickness without gallstones or sonographic Murphy sign, nonspecific. Otherwise normal exam. Electronically Signed   By: Lavonia Dana M.D.   On: 02/15/2019 09:34     CBC Recent Labs  Lab 03/10/19 1458 03/11/19 0454 03/12/19 0420  WBC 6.8 5.9 7.5  HGB 13.0 14.3 11.7*  HCT 39.9 45.2 36.6*  PLT 150 146* 147*  MCV 93.0 96.6 96.1  MCH 30.3 30.6 30.7  MCHC 32.6 31.6 32.0  RDW 13.3 13.2 13.1  LYMPHSABS 1.4 1.0 1.2  MONOABS 0.4 0.2 0.5  EOSABS 0.2 0.2 0.2  BASOSABS 0.0 0.0 0.0    Chemistries  Recent Labs  Lab 03/10/19 1458 03/11/19 0454 03/12/19 0420  NA 139 142 139  K 3.6 4.0 3.8  CL 104 103 102  CO2 '28 29 30  ' GLUCOSE 142* 136* 115*  BUN '16 11 8  ' CREATININE 0.72 0.75 0.55*    CALCIUM 8.5* 8.7* 8.5*  AST 22 23 14*  ALT '26 28 19  ' ALKPHOS 62 58 44  BILITOT 0.4 0.5 1.0   ------------------------------------------------------------------------------------------------------------------ No results for input(s): CHOL, HDL, LDLCALC, TRIG, CHOLHDL, LDLDIRECT in the last 72 hours.  No results found for: HGBA1C ------------------------------------------------------------------------------------------------------------------ No results for input(s): TSH, T4TOTAL, T3FREE, THYROIDAB in the last 72 hours.  Invalid input(s): FREET3 ------------------------------------------------------------------------------------------------------------------ No results for input(s): VITAMINB12, FOLATE, FERRITIN, TIBC, IRON, RETICCTPCT in the last 72 hours.  Coagulation profile Recent Labs  Lab 03/11/19 0454  INR 0.9    No results for input(s): DDIMER in the last 72 hours.  Cardiac Enzymes No results for input(s): CKMB, TROPONINI, MYOGLOBIN in the last 168 hours.  Invalid input(s): CK ------------------------------------------------------------------------------------------------------------------    Component Value Date/Time   BNP 92.0 02/03/2018 0507     Kalese Ensz M.D on 03/12/2019 at 3:18 PM  Go to [email or web address] - for contact info  [facility] - Office  [phone]

## 2019-03-12 NOTE — Progress Notes (Signed)
Patient taken off of BIPAP and is now on [facility] with sats of 95%. BBS clear/diminished. Patient is tolerating well at this time. BIPAP at bedside on standby if needed.

## 2019-03-12 NOTE — Progress Notes (Signed)
Pt wished to make Christian Ellison his fiancee POA. He expressed that he wished to remove sister Christian Ellison from contacts. He gave strict wishes NOT to give sister Christian Ellison any further information regarding care. Pastoral consult was placed for Advance Directive creation.

## 2019-03-12 NOTE — Progress Notes (Signed)
Have increased BiPAP 18/10. Patient is very lethargic, awakens with stimulus but falls back asleep. PCO2 is 61.2 with morning blood gas. 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

## 2019-03-13 LAB — COMPREHENSIVE METABOLIC PANEL
ALT: 16 U/L (ref 0–44)
AST: 13 U/L — ABNORMAL LOW (ref 15–41)
Albumin: 2.9 g/dL — ABNORMAL LOW (ref 3.5–5.0)
Alkaline Phosphatase: 43 U/L (ref 38–126)
Anion gap: 5 (ref 5–15)
BUN: 11 mg/dL (ref 6–20)
CO2: 30 mmol/L (ref 22–32)
Calcium: 8.3 mg/dL — ABNORMAL LOW (ref 8.9–10.3)
Chloride: 105 mmol/L (ref 98–111)
Creatinine, Ser: 0.56 mg/dL — ABNORMAL LOW (ref 0.61–1.24)
GFR calc Af Amer: >60 mL/min (ref >60)
GFR calc non Af Amer: >60 mL/min (ref >60)
Glucose, Bld: 109 mg/dL — ABNORMAL HIGH (ref 70–99)
Potassium: 3.7 mmol/L (ref 3.5–5.1)
Sodium: 140 mmol/L (ref 135–145)
Total Bilirubin: 0.7 mg/dL (ref 0.3–1.2)
Total Protein: 6 g/dL — ABNORMAL LOW (ref 6.5–8.1)

## 2019-03-13 LAB — CBC WITH DIFFERENTIAL/PLATELET
Abs Immature Granulocytes: 0.01 10*3/uL (ref 0.00–0.07)
Basophils Absolute: 0 10*3/uL (ref 0.0–0.1)
Basophils Relative: 0 %
Eosinophils Absolute: 0.3 10*3/uL (ref 0.0–0.5)
Eosinophils Relative: 6 %
HCT: 33.6 % — ABNORMAL LOW (ref 39.0–52.0)
Hemoglobin: 11 g/dL — ABNORMAL LOW (ref 13.0–17.0)
Immature Granulocytes: 0 %
Lymphocytes Relative: 28 %
Lymphs Abs: 1.3 10*3/uL (ref 0.7–4.0)
MCH: 31 pg (ref 26.0–34.0)
MCHC: 32.7 g/dL (ref 30.0–36.0)
MCV: 94.6 fL (ref 80.0–100.0)
Monocytes Absolute: 0.4 10*3/uL (ref 0.1–1.0)
Monocytes Relative: 8 %
Neutro Abs: 2.6 10*3/uL (ref 1.7–7.7)
Neutrophils Relative %: 58 %
Platelets: 156 10*3/uL (ref 150–400)
RBC: 3.55 MIL/uL — ABNORMAL LOW (ref 4.22–5.81)
RDW: 12.8 % (ref 11.5–15.5)
WBC: 4.6 10*3/uL (ref 4.0–10.5)
nRBC: 0 % (ref 0.0–0.2)

## 2019-03-13 LAB — BLOOD GAS, ARTERIAL
Acid-Base Excess: 6.8 mmol/L — ABNORMAL HIGH (ref 0.0–2.0)
Bicarbonate: 29.9 mmol/L — ABNORMAL HIGH (ref 20.0–28.0)
FIO2: 36
O2 Saturation: 97.5 %
Patient temperature: 37
pCO2 arterial: 54.5 mmHg — ABNORMAL HIGH (ref 32.0–48.0)
pH, Arterial: 7.384 (ref 7.350–7.450)
pO2, Arterial: 93.9 mmHg (ref 83.0–108.0)

## 2019-03-13 MED ORDER — ATENOLOL 25 MG PO TABS: 25.0000 mg | ORAL_TABLET | Freq: Every day | ORAL | 1 refills | Status: DC

## 2019-03-13 MED ORDER — PANTOPRAZOLE SODIUM 40 MG PO TBEC: 40.0000 mg | DELAYED_RELEASE_TABLET | Freq: Every day | ORAL | 2 refills | Status: DC

## 2019-03-13 MED ORDER — ASPIRIN EC 81 MG PO TBEC: 81.0000 mg | DELAYED_RELEASE_TABLET | Freq: Every day | ORAL | 2 refills | Status: DC

## 2019-03-13 MED ORDER — AMOXICILLIN-POT CLAVULANATE 875-125 MG PO TABS: 1.0000 | ORAL_TABLET | Freq: Two times a day (BID) | ORAL | 0 refills | Status: AC

## 2019-03-13 MED ORDER — ALBUTEROL SULFATE HFA 108 (90 BASE) MCG/ACT IN AERS: 2.0000 | INHALATION_SPRAY | Freq: Four times a day (QID) | RESPIRATORY_TRACT | 1 refills | Status: DC | PRN

## 2019-03-13 NOTE — Plan of Care (Signed)
  Problem: Education: Goal: Knowledge of General Education information will improve Description: Including pain rating scale, medication(s)/side effects and non-pharmacologic comfort measures Outcome: Progressing   Problem: Health Behavior/Discharge Planning: Goal: Ability to manage health-related needs will improve Outcome: Progressing   Problem: Clinical Measurements: Goal: Ability to maintain clinical measurements within normal limits will improve Outcome: Progressing Goal: Will remain free from infection Outcome: Progressing Goal: Diagnostic test results will improve Outcome: Progressing   Problem: Activity: Goal: Risk for activity intolerance will decrease Outcome: Progressing   Problem: Coping: Goal: Level of anxiety will decrease Outcome: Progressing   Problem: Elimination: Goal: Will not experience complications related to bowel motility Outcome: Progressing   Problem: Safety: Goal: Ability to remain free from injury will improve Outcome: Progressing   Problem: Activity: Goal: Ability to tolerate increased activity will improve Outcome: Progressing   Problem: Respiratory: Goal: Ability to maintain a clear airway will improve Outcome: Progressing

## 2019-03-13 NOTE — Progress Notes (Signed)
Being discharged to home. Given all prescriptions and made sure he saw referral to physician office so he can schedule sleep study. Rolled out by wheelchair.

## 2019-03-13 NOTE — Progress Notes (Signed)
Patient walked about 400 feet around ICU. No difficulty, oxygen saturations 98% on room air. Never complained of being fatigued. Heart rate remained 86 beats per minute.

## 2019-03-13 NOTE — ACP (Advance Care Planning) (Signed)
Shared Advance Directives information with Mr Jasek. Unable to complete due to his discharge. Shared that contact information for further information was included in the information.

## 2019-03-13 NOTE — Discharge Summary (Signed)
Christian Ellison, is a 58 y.o. male  DOB 1961/04/11  MRN [id number].  Admission date:  03/11/2019  Admitting Physician  Murlean Iba, MD  Discharge Date:  03/13/2019   Primary MD  [place]  Recommendations for primary care physician for things to follow:   1)Follow up with Primary Care Provider to schedule sleep study so you can get CPAP Machine for Sleep Apnea 2)Follow up with Dr Laural Golden (Gastroenterologist) for possible stretching of your food pipe/Esophagus 3)Please complete Augmentin antibiotic as prescribed   Admission Diagnosis  Pneumonia of both lungs due to infectious organism, unspecified part of lung [J18.9] Sepsis without acute organ dysfunction, due to unspecified organism [facility]) [A41.9] Acute respiratory failure with hypoxemia ([place]) [J96.01]   Discharge Diagnosis  Pneumonia of both lungs due to infectious organism, unspecified part of lung [J18.9] Sepsis without acute organ dysfunction, due to unspecified organism [facility]) [A41.9] Acute respiratory failure with hypoxemia ([place]) [J96.01]    Principal Problem:   Acute respiratory failure with hypoxemia (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    Opiate abuse, continuous [facility])     Past Surgical History:  Procedure Laterality Date   BIOPSY  03/18/2018   Procedure: BIOPSY;  Surgeon: Rogene Houston, MD;  Location: [facility];  Service: Endoscopy;;  gastric    COLONOSCOPY N/A 11/22/2013   Dr. Laural Golden: Normal except for hemorrhoids.  Next colonoscopy 10 years.   ESOPHAGEAL DILATION N/A 03/18/2018   Procedure: ESOPHAGEAL DILATION;  Surgeon: Rogene Houston, MD;  Location: [facility];  Service: Endoscopy;  Laterality: N/A;   ESOPHAGEAL MANOMETRY N/A 06/15/2018   Procedure: ESOPHAGEAL MANOMETRY (EM);  Surgeon: Mauri Pole, MD;  Location: [facility];  Service: Endoscopy;  Laterality: N/A;   ESOPHAGOGASTRODUODENOSCOPY (EGD) WITH PROPOFOL N/A 03/18/2018   Dr. Laural Golden: Benign-appearing mild esophageal stenosis proximal to the GE J, status post dilation.  2 cm hiatal hernia.  Gastritis, biopsies negative for H. pylori   Fracture Right Leg     Patient has rod/screws in this leg   Rotor Cuff  2012   Right Shoulder       HPI  from the history and physical done on the day of admission:    Chief Complaint: altered mentation  HPI: Christian Ellison is a 58 y.o. male with history of polysubstance abuse, hepatitis C virus positive, history of recurrent aspiration pneumonia and chronic pain has been  having elevated heart rate dehydration and altered mental status for past couple of days as he has been working outside in the intense heat.  He has a home pulse ox that has been reading in the low 80s and he presented to the emergency department yesterday with cough and shortness of breath.  He was evaluated and tested negative for COVID-19 however he did have a CTA chest that showed findings of a multifocal pneumonia.  He was not hypoxic at the time.  He was discharged with a prescription for antibiotics which he did not get filled.  He returned to the emergency department earlier this morning due to worsening mental status changes and tachycardia.  EMS brought him here and they noted that his oxygen saturations were in the 80s.  He was somnolent on  arrival.  He denied chest pain and shortness of breath.  He was started on 4 L of oxygen with improvement in his oxygen saturation.  He confirmed that he did not get the antibiotic prescription filled that was given yesterday.  He also was noted to be tachycardic.  His lactic acid was in normal range.  His blood pressures were elevated.  He was started on broad-spectrum antibiotic coverage.  His COVID-19 test was again negative.  His ABG revealed that his PCO2 was 56.6.  His pH was 7.335.  PCO2 was 58.6.   Hospital Course:    Brief Summary 58 y.o.malewithpast medical history relevant for hypothyroidism, HTN,  history of hep C, h/o esophageal spasms and strictures/stenosis with history of recurrent aspiration pneumonia in the past requiring intubation and ventilation--as well as history of chronic pain and anxiety with opiate, THC,  Amphetamine and benzo abuse admitted on 03/11/2019 with acute hypoxic respiratory failure due to presumed aspiration pneumonia with CT chest from 03/10/2019 suggesting multifocal pneumonia, please note patient had a positive MRSA screen on 02/13/2019   A/p  1)Sepsis secondary to CAP/ multifocal pneumonia--- suspect aspiration related, treated with Unasyn and IV vancomycin, and bronchodilators --Hypoxia resolved, patient ambulated on room air without significant dyspnea on exertion or hypoxia -CoVID Neg ----Patient met sepsis criteria on admission with tachycardia, tachypnea, hypotension and hypoxia in the setting of multifocal pneumonia --Sepsis pathophysiology has resolved --Discharged home on p.o. Augmentin  2)H/o Esophagealdysmotility/stricturesstatus post dilations with recurrent aspiration---- -patient reminded to follow-up as outpatient for GI consult with Dr. Devonne Doughty previously had EGD with dilatation with Dr. Laural Golden  3)Hypothyroidism=----stable, TSH was 0.4 months ago, continue levothyroxine  4)History of hepatitis C- -Complete therapy with  [facility] -Continue follow-up with gastroenterology service as an outpatient.  5)HxChronicPain/Depression/Anxiety andPolysubstance dependence -- Previously documented opioid, THC , Amphetamine and benzodiazepine dependence--- patient not interested in rehab, patient states he is currently not abusing drugs ------be judicious with opiates and benzos,  6)Acute Hypoxic Respiratory Failure----secondary to presumed aspiration pneumonia treat as above #1--- hypoxia has resolved as noted above #1  7) possible OSA--- patient needs outpatient sleep study  Code Status : Full code  Family Communication:   NA (patient is alert, awake and coherent)   Disposition Plan  :  Home  Discharge Condition: stable  Follow UP--as advised above  [place], [facility] Follow up on 03/16/2019.   Why: [phone]  Dr Marlon Pel on Thursday at 1:30 in the office for your hospital follow up appointment  Ask for sleep study [address]  [employer] information: [place] [address] [phone]  Diet and Activity recommendation:  As advised  Discharge Instructions    Discharge Instructions    Call MD for:  difficulty breathing, headache or visual disturbances   Complete by: As directed    Call MD for:  persistant dizziness or light-headedness   Complete by: As directed    Call MD for:  persistant nausea and vomiting   Complete by: As directed    Call MD for:  severe uncontrolled pain   Complete by: As directed    Call MD for:  temperature >100.4   Complete by: As directed    Diet - low sodium heart healthy   Complete by: As directed    Discharge instructions   Complete by: As directed    1)Follow up with Primary Care Provider to schedule sleep study so you can get CPAP Machine for Sleep Apnea 2)Follow up with Dr Laural Golden (Gastroenterologist) for possible stretching of your food  pipe/Esophagus 3)Please complete Augmentin antibiotic as prescribed   Increase activity slowly   Complete by: As directed         Discharge Medications     Allergies as of 03/13/2019   No Known Allergies     Medication List    STOP taking these medications   amoxicillin 500 MG capsule Commonly known as: AMOXIL   azithromycin 250 MG tablet Commonly known as: Zithromax Z-Pak   dexamethasone 1 MG tablet Commonly known as: DECADRON     TAKE these medications   albuterol 108 (90 Base) MCG/ACT inhaler Commonly known as: VENTOLIN HFA Inhale 2 puffs into the lungs every 6 (six) hours as needed for wheezing or shortness of breath (cough). What changed: reasons to take this   ALPRAZolam 0.5 MG tablet Commonly known as: XANAX Take 0.5 mg by mouth 2 (two) times daily.   amoxicillin-clavulanate 875-125 MG tablet Commonly known as: Augmentin Take 1 tablet by mouth 2 (two) times daily for 5 days.   aspirin EC 81 MG tablet Take 1 tablet (81 mg total) by mouth daily with breakfast. What changed: when to take this   atenolol 25 MG tablet Commonly known as: TENORMIN Take 1 tablet (25 mg total) by mouth daily.   levothyroxine 25 MCG tablet Commonly known as: SYNTHROID Take 25 mcg by mouth daily.   multivitamin with minerals Tabs tablet Take 1 tablet by mouth daily. 50+   ondansetron 4 MG disintegrating tablet Commonly known as: Zofran ODT Take 1 tablet (4 mg total) by mouth every 8 (eight) hours as needed for nausea or vomiting.   pantoprazole 40 MG tablet Commonly known as: Protonix Take 1 tablet (40 mg total) by mouth daily. What changed: when to take this       Major procedures and Radiology Reports - PLEASE review detailed and final reports for all details, in brief -   Ct Angio Chest Pe W And/or Wo Contrast  Result Date: 03/10/2019 CLINICAL DATA:  Shortness of breath, fatigue EXAM: CT ANGIOGRAPHY CHEST WITH CONTRAST TECHNIQUE: Multidetector CT imaging of the  chest was performed using the standard protocol during bolus administration of intravenous contrast. Multiplanar CT image reconstructions and MIPs were obtained to evaluate the vascular anatomy. CONTRAST:  24m OMNIPAQUE IOHEXOL 350 MG/ML SOLN COMPARISON:  02/13/2019 FINDINGS: Cardiovascular: Satisfactory opacification of the pulmonary arteries to the segmental level. No evidence of pulmonary embolism. Normal heart size. No pericardial effusion. Mediastinum/Nodes: No enlarged mediastinal, hilar, or axillary lymph nodes. Patulous esophagus. Thyroid gland and trachea demonstrate no significant findings. Lungs/Pleura: There are numerous  clustered small nodular and ground-glass opacities bilaterally, most conspicuous in the right lower lobe (series 6, image 102). No pleural effusion or pneumothorax. Upper Abdomen: No acute abnormality. Musculoskeletal: No chest wall abnormality. No acute or significant osseous findings. Review of the MIP images confirms the above findings. IMPRESSION: 1.  Negative examination for pulmonary embolism. 2. There are numerous clustered small nodular and ground-glass opacities bilaterally, most conspicuous in the right lower lobe (series 6, image 102). Findings are consistent with multifocal atypical infection. Electronically Signed   By: Eddie Candle M.D.   On: 03/10/2019 17:11   Ct Angio Chest Pe W And/or Wo Contrast  Result Date: 02/13/2019 CLINICAL DATA:  Dry cough and shortness of breath EXAM: CT ANGIOGRAPHY CHEST WITH CONTRAST TECHNIQUE: Multidetector CT imaging of the chest was performed using the standard protocol during bolus administration of intravenous contrast. Multiplanar CT image reconstructions and MIPs were obtained to evaluate the vascular anatomy. CONTRAST:  173m OMNIPAQUE 350 COMPARISON:  Chest x-ray from earlier in the same day, CT from 06/09/2018. FINDINGS: Cardiovascular: Thoracic aorta is within normal limits with only minimal atherosclerotic calcifications  identified. No coronary calcifications are seen. The pulmonary artery shows a normal branching pattern without focal filling defect to suggest pulmonary embolism. Mediastinum/Nodes: Thoracic inlet is within normal limits. The esophagus demonstrates fluid within which would correspond with the patient's given clinical history of reflux and aspiration. No significant lymphadenopathy is identified. Lungs/Pleura: Lungs are well aerated bilaterally. Very minimal atelectatic changes are noted right greater than left. No focal parenchymal nodule is seen. No focal confluent infiltrate is identified. Previously seen nodular densities have resolved in the interval. Upper Abdomen: Visualized upper abdomen demonstrates some mild inflammatory changes surrounding the adrenal glands and celiac axis. A portion of this may be related to the patient's underlying vomiting. CT of the abdomen and pelvis may be helpful for further evaluation. Musculoskeletal: Degenerative changes of the thoracic spine are noted. No acute rib abnormality is seen. Review of the MIP images confirms the above findings. IMPRESSION: No evidence of pulmonary emboli. Mild bibasilar atelectasis. No findings to suggest aspiration pneumonia are noted. Fluid within the esophagus consistent with the patient's given clinical history of reflux. Hazy inflammatory changes surrounding the adrenal glands within the upper abdominal fat. Possibility of underlying pancreatitis would deserve consideration although this may be related to recent vomiting episodes. CT of the abdomen and pelvis and correlation with lab values may be helpful. Aortic Atherosclerosis (ICD10-I70.0). Electronically Signed   By: MInez CatalinaM.D.   On: 02/13/2019 16:11   Ct Abdomen Pelvis W Contrast  Result Date: 02/14/2019 CLINICAL DATA:  Vomiting.  Abnormal CT scan. EXAM: CT ABDOMEN AND PELVIS WITH CONTRAST TECHNIQUE: Multidetector CT imaging of the abdomen and pelvis was performed using the  standard protocol following bolus administration of intravenous contrast. CONTRAST:  350mOMNIPAQUE IOHEXOL 300 MG/ML SOLN, 10067mMNIPAQUE IOHEXOL 300 MG/ML SOLN COMPARISON:  CT scan of February 13, 2019. FINDINGS: Lower chest: No acute abnormality. Hepatobiliary: No cholelithiasis or biliary dilatation is noted. The liver is unremarkable. There does appear to be significant gallbladder wall thickening present. Pancreas: Unremarkable. No pancreatic ductal dilatation or surrounding inflammatory changes. Spleen: Normal in size without focal abnormality. Adrenals/Urinary Tract: Adrenal glands are unremarkable. Kidneys are normal except for minimal perirenal stranding bilaterally, without renal calculi, focal lesion, or hydronephrosis. Bladder is unremarkable. Stomach/Bowel: Stomach is within normal limits. Appendix appears normal. No evidence of bowel wall thickening, distention, or inflammatory changes. Vascular/Lymphatic: No significant vascular findings are  present. No enlarged abdominal or pelvic lymph nodes. Reproductive: Prostate is unremarkable. Other: No abdominal wall hernia or abnormality. Minimal amount of free fluid is noted in the pelvis. Musculoskeletal: No acute or significant osseous findings. IMPRESSION: Moderate gallbladder wall thickening is noted concerning for possible cholecystitis. No cholelithiasis is noted. Ultrasound is recommended for further evaluation. Minimal bilateral perirenal stranding is noted of uncertain significance. No hydronephrosis or renal obstruction is noted. Minimal amount of free fluid is noted in the pelvis. Electronically Signed   By: Marijo Conception M.D.   On: 02/14/2019 12:33   Dg Chest Port 1 View  Result Date: 03/11/2019 CLINICAL DATA:  Shortness of breath EXAM: PORTABLE CHEST 1 VIEW COMPARISON:  03/10/2019 FINDINGS: The heart size and mediastinal contours are within normal limits. Both lungs are clear. The visualized skeletal structures are unremarkable. IMPRESSION:  No active disease. Electronically Signed   By: Ulyses Jarred M.D.   On: 03/11/2019 04:12   Dg Chest Portable 1 View  Result Date: 03/10/2019 CLINICAL DATA:  FATIGUE X COUPLE OF DAYS/HX PNEUMONIA/NON SMOKER EXAM: PORTABLE CHEST 1 VIEW COMPARISON:  CT angio chest 02/13/2019, portable chest x-ray on 02/13/2019 FINDINGS: Heart size is normal. The lungs are free of focal consolidations and pleural effusions. No pulmonary edema. IMPRESSION: No active disease. Electronically Signed   By: Nolon Nations M.D.   On: 03/10/2019 15:14   Dg Chest Portable 1 View  Result Date: 02/13/2019 CLINICAL DATA:  Productive cough and shortness of breath EXAM: PORTABLE CHEST 1 VIEW COMPARISON:  02/13/2019 FINDINGS: Cardiac shadow is within normal limits. Lungs are well aerated bilaterally. No focal infiltrate is seen. Interval clearing in the left retrocardiac region is noted consistent with previous atelectasis. No bony abnormality is noted. IMPRESSION: No active disease. Electronically Signed   By: Inez Catalina M.D.   On: 02/13/2019 13:15   Dg Chest Portable 1 View  Result Date: 02/13/2019 CLINICAL DATA:  Cough and fever. EXAM: PORTABLE CHEST 1 VIEW COMPARISON:  November 06, 2018 FINDINGS: The previously noted right lower lobe pneumonia has significantly improved. There is a persistent retrocardiac opacity. There is no pneumothorax. No large pleural effusion. No acute osseous abnormality. IMPRESSION: 1. Significant interval improvement in the right lower lobe pneumonia. 2. Persistent faint retrocardiac opacity which may represent atelectasis or persistent infiltrate. 3. No definite radiographic evidence for recurring aspiration pneumonia. Electronically Signed   By: Constance Holster M.D.   On: 02/13/2019 01:02   US Abdomen Limited Ruq  Result Date: 02/15/2019 CLINICAL DATA:  Abdominal pain, hepatitis-C virus with treatment EXAM: ULTRASOUND ABDOMEN LIMITED RIGHT UPPER QUADRANT COMPARISON:  CT abdomen and pelvis 02/14/2019  FINDINGS: Gallbladder: Normally distended without stones. Upper normal wall thickness. No pericholecystic fluid or sonographic Murphy sign. Common bile duct: Diameter: Normal caliber Liver: Echogenic parenchyma, likely fatty infiltration though this can be seen with cirrhosis and certain infiltrative disorders. No focal hepatic mass or nodularity. Portal vein is patent on color Doppler imaging with normal direction of blood flow towards the liver. No RIGHT upper quadrant free fluid. IMPRESSION: Upper normal gallbladder wall thickness without gallstones or sonographic Murphy sign, nonspecific. Otherwise normal exam. Electronically Signed   By: Lavonia Dana M.D.   On: 02/15/2019 09:34    Micro Results   Recent Results (from the past 240 hour(s))  SARS Coronavirus 2 ([place]- Performed in [facility] hospital lab), Hosp Order     Status: None   Collection Time: 03/10/19  3:00 PM   Specimen: Nasopharyngeal Swab  Result  Value Ref Range Status   SARS Coronavirus 2 NEGATIVE NEGATIVE Final    Comment: (NOTE) If result is NEGATIVE SARS-CoV-2 target nucleic acids are NOT DETECTED. The SARS-CoV-2 RNA is generally detectable in upper and lower  respiratory specimens during the acute phase of infection. The lowest  concentration of SARS-CoV-2 viral copies this assay can detect is 250  copies / mL. A negative result does not preclude SARS-CoV-2 infection  and should not be used as the sole basis for treatment or other  patient management decisions.  A negative result may occur with  improper specimen collection / handling, submission of specimen other  than nasopharyngeal swab, presence of viral mutation(s) within the  areas targeted by this assay, and inadequate number of viral copies  (<250 copies / mL). A negative result must be combined with clinical  observations, patient history, and epidemiological information. If result is POSITIVE SARS-CoV-2 target nucleic acids are DETECTED. The SARS-CoV-2 RNA  is generally detectable in upper and lower  respiratory specimens dur ing the acute phase of infection.  Positive  results are indicative of active infection with SARS-CoV-2.  Clinical  correlation with patient history and other diagnostic information is  necessary to determine patient infection status.  Positive results do  not rule out bacterial infection or co-infection with other viruses. If result is PRESUMPTIVE POSTIVE SARS-CoV-2 nucleic acids MAY BE PRESENT.   A presumptive positive result was obtained on the submitted specimen  and confirmed on repeat testing.  While 2019 novel coronavirus  (SARS-CoV-2) nucleic acids may be present in the submitted sample  additional confirmatory testing may be necessary for epidemiological  and / or clinical management purposes  to differentiate between  SARS-CoV-2 and other Sarbecovirus currently known to infect humans.  If clinically indicated additional testing with an alternate test  methodology [phone]) is advised. The SARS-CoV-2 RNA is generally  detectable in upper and lower respiratory sp ecimens during the acute  phase of infection. The expected result is Negative. Fact Sheet for Patients:  StrictlyIdeas.no Fact Sheet for Healthcare Providers: BankingDealers.co.za This test is not yet approved or cleared by the [place] FDA and has been authorized for detection and/or diagnosis of SARS-CoV-2 by FDA under an Emergency Use Authorization (EUA).  This EUA will remain in effect (meaning this test can be used) for the duration of the COVID-19 declaration under Section 564(b)(1) of the Act, 21 U.S.C. section 360bbb-3(b)(1), unless the authorization is terminated or revoked sooner. Performed at [facility], [address], [place], [place] [address]   SARS Coronavirus 2 ([place] - Performed in [facility] hospital lab), Hosp Order     Status: None   Collection Time: 03/11/19  3:42 AM    Specimen: Nasopharyngeal Swab  Result Value Ref Range Status   SARS Coronavirus 2 NEGATIVE NEGATIVE Final    Comment: (NOTE) If result is NEGATIVE SARS-CoV-2 target nucleic acids are NOT DETECTED. The SARS-CoV-2 RNA is generally detectable in upper and lower  respiratory specimens during the acute phase of infection. The lowest  concentration of SARS-CoV-2 viral copies this assay can detect is 250  copies / mL. A negative result does not preclude SARS-CoV-2 infection  and should not be used as the sole basis for treatment or other  patient management decisions.  A negative result may occur with  improper specimen collection / handling, submission of specimen other  than nasopharyngeal swab, presence of viral mutation(s) within the  areas targeted by this assay, and inadequate number of viral  copies  (<250 copies / mL). A negative result must be combined with clinical  observations, patient history, and epidemiological information. If result is POSITIVE SARS-CoV-2 target nucleic acids are DETECTED. The SARS-CoV-2 RNA is generally detectable in upper and lower  respiratory specimens dur ing the acute phase of infection.  Positive  results are indicative of active infection with SARS-CoV-2.  Clinical  correlation with patient history and other diagnostic information is  necessary to determine patient infection status.  Positive results do  not rule out bacterial infection or co-infection with other viruses. If result is PRESUMPTIVE POSTIVE SARS-CoV-2 nucleic acids MAY BE PRESENT.   A presumptive positive result was obtained on the submitted specimen  and confirmed on repeat testing.  While 2019 novel coronavirus  (SARS-CoV-2) nucleic acids may be present in the submitted sample  additional confirmatory testing may be necessary for epidemiological  and / or clinical management purposes  to differentiate between  SARS-CoV-2 and other Sarbecovirus currently known to infect humans.  If  clinically indicated additional testing with an alternate test  methodology [phone]) is advised. The SARS-CoV-2 RNA is generally  detectable in upper and lower respiratory sp ecimens during the acute  phase of infection. The expected result is Negative. Fact Sheet for Patients:  StrictlyIdeas.no Fact Sheet for Healthcare Providers: BankingDealers.co.za This test is not yet approved or cleared by the [place] FDA and has been authorized for detection and/or diagnosis of SARS-CoV-2 by FDA under an Emergency Use Authorization (EUA).  This EUA will remain in effect (meaning this test can be used) for the duration of the COVID-19 declaration under Section 564(b)(1) of the Act, 21 U.S.C. section 360bbb-3(b)(1), unless the authorization is terminated or revoked sooner. Performed at [facility], [address] [place], [place] [address]   Blood Culture (routine x 2)     Status: None (Preliminary result)   Collection Time: 03/11/19  4:54 AM   Specimen: Left Antecubital; Blood  Result Value Ref Range Status   Specimen Description LEFT ANTECUBITAL  Final   Special Requests   Final    BOTTLES DRAWN AEROBIC AND ANAEROBIC Blood Culture adequate volume   Culture   Final    NO GROWTH 2 DAYS Performed at [facility], [address] [place], [place] [address]    Report Status PENDING  Incomplete  Blood Culture (routine x 2)     Status: None (Preliminary result)   Collection Time: 03/11/19  5:09 AM   Specimen: BLOOD RIGHT HAND  Result Value Ref Range Status   Specimen Description BLOOD RIGHT HAND  Final   Special Requests   Final    BOTTLES DRAWN AEROBIC AND ANAEROBIC Blood Culture adequate volume   Culture   Final    NO GROWTH 2 DAYS Performed at [facility], [address], [place], [place] [address]    Report Status PENDING  Incomplete  Urine culture     Status: None   Collection Time: 03/11/19  8:21 AM   Specimen: Urine, Clean Catch    Result Value Ref Range Status   Specimen Description   Final    URINE, CLEAN CATCH Performed at [facility], [address], [place], [place] [address]    Special Requests   Final    NONE Performed at [facility], [address], [place], [place] [address]    Culture   Final    NO GROWTH Performed at [facility], [place] [address], [place], [place] [address]    Report  Status 03/12/2019 FINAL  Final  MRSA PCR Screening     Status: None   Collection Time: 03/11/19  9:33 AM   Specimen: Nasal Mucosa; Nasopharyngeal  Result Value Ref Range Status   MRSA by PCR NEGATIVE NEGATIVE Final    Comment:        The GeneXpert MRSA Assay (FDA approved for NASAL specimens only), is one component of a comprehensive MRSA colonization surveillance program. It is not intended to diagnose MRSA infection nor to guide or monitor treatment for MRSA infections. Performed at [facility], [address] [place], [place] [address]        Today   Subjective    Christian Ellison today has no new complaints, --Eating and drinking well, no fevers no shortness of breath, no significant dyspnea on exertion, no hypoxia post ambulation on room air          Patient has been seen and examined prior to discharge   Objective   Blood pressure 132/69, pulse (!) 55, temperature 97.9 F (36.6 C), temperature source Oral, resp. rate 10, height '5\' 8"'  (1.727 m), weight 82.8 kg, SpO2 94 %.   Intake/Output Summary (Last 24 hours) at 03/13/2019 1709 Last data filed at 03/13/2019 1240 Gross per 24 hour  Intake 3099 ml  Output 2550 ml  Net 549 ml    Exam Gen:- Awake Alert, able to speak in complete sentences  HEENT:- [place].AT, No sclera icterus Neck-Supple Neck,No JVD,.  Lungs-improved air movement, no wheezing CV- S1, S2 normal, regular  Abd-  +ve B.Sounds, Abd Soft, No tenderness,    Extremity/Skin:- No  edema, pedal pulses present  Psych-affect is appropriate, oriented x3 Neuro-no new focal  deficits, no tremors   Data Review   CBC w Diff:  Lab Results  Component Value Date   WBC 4.6 03/13/2019   HGB 11.0 (L) 03/13/2019   HCT 33.6 (L) 03/13/2019   PLT 156 03/13/2019   LYMPHOPCT 28 03/13/2019   MONOPCT 8 03/13/2019   EOSPCT 6 03/13/2019   BASOPCT 0 03/13/2019    CMP:  Lab Results  Component Value Date   NA 140 03/13/2019   K 3.7 03/13/2019   CL 105 03/13/2019   CO2 30 03/13/2019   BUN 11 03/13/2019   CREATININE 0.56 (L) 03/13/2019   PROT 6.0 (L) 03/13/2019   ALBUMIN 2.9 (L) 03/13/2019   BILITOT 0.7 03/13/2019   ALKPHOS 43 03/13/2019   AST 13 (L) 03/13/2019   ALT 16 03/13/2019  . Total Discharge time is about 33 minutes  Roxan Hockey M.D on 03/13/2019 at 5:09 PM  Go to [email or web address] -  for contact info  [facility] - Office  [phone]

## 2019-03-13 NOTE — Progress Notes (Signed)
Patient is on 2 liters his saturation are 97. He is more wake BiPAP is on stand by.

## 2019-03-13 NOTE — Discharge Instructions (Signed)
1)Follow up with Primary Care Provider to schedule sleep study so you can get CPAP Machine for Sleep Apnea 2)Follow up with Dr Laural Golden (Gastroenterologist) for possible stretching of your food pipe/Esophagus 3)Please complete Augmentin antibiotic as prescribed

## 2019-03-16 LAB — CULTURE, BLOOD (ROUTINE X 2)
Culture: NO GROWTH
Culture: NO GROWTH
Special Requests: ADEQUATE
Special Requests: ADEQUATE

## 2019-04-11 ENCOUNTER — Emergency Department (HOSPITAL_COMMUNITY)
Admission: EM | Admit: 2019-04-11 | Discharge: 2019-04-11 | Disposition: A | Discharge status: Home / Self Care | Payer: Medicaid Other | Attending: Emergency Medicine | Admitting: Emergency Medicine

## 2019-04-11 NOTE — ED Triage Notes (Signed)
Pt brought in by [facility] for c/o sob that started x one hour pta; pt O2 sats 90-92% on 3L via [place]

## 2019-04-11 NOTE — ED Notes (Signed)
Pt's fiance came in to speak with registration to state pt needed to be seen; registration called charge RN and this RN went out to speak with fiance, she is wanting pt to be seen; this RN informed fiance that we cannot make the pt come in to be seen, it has to be of his own free will; she stated pt would come back in

## 2019-04-11 NOTE — ED Notes (Signed)
Pt informed registration he was leaving.  

## 2019-04-18 ENCOUNTER — Ambulatory Visit (INDEPENDENT_AMBULATORY_CARE_PROVIDER_SITE_OTHER): Payer: Medicaid Other | Admitting: Nurse Practitioner

## 2019-04-18 NOTE — Progress Notes (Deleted)
   Subjective:    Patient ID: Christian Ellison, male    DOB: 1961-05-31, 58 y.o.   MRN: [id number]  HPI  Patient is a 58 year old male with a past medical history of anxiety, opiate abuse, hypothyroidism, bilateral pneumonia 02/2019, GERD, esophageal stricture, and chronic hepatitis C.  Past Medical History:  Diagnosis Date  . Anxiety   . Aspiration pneumonia ([place]) 05/26/2018   RLL  . Chronic pain   . GERD (gastroesophageal reflux disease)   . 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   Marland Kitchen Opiate abuse, continuous ([place])        Review of Systems     Objective:   Physical Exam        Assessment & Plan:

## 2019-04-20 ENCOUNTER — Other Ambulatory Visit (HOSPITAL_BASED_OUTPATIENT_CLINIC_OR_DEPARTMENT_OTHER): Payer: Self-pay

## 2019-04-20 DIAGNOSIS — G4730 Sleep apnea, unspecified: Secondary | ICD-10-CM

## 2019-04-25 ENCOUNTER — Other Ambulatory Visit (HOSPITAL_COMMUNITY)
Admission: RE | Admit: 2019-04-25 | Discharge: 2019-04-25 | Disposition: A | Discharge status: Home / Self Care | Payer: Medicaid Other | Source: Ambulatory Visit | Attending: Neurology | Admitting: Neurology

## 2019-04-25 ENCOUNTER — Other Ambulatory Visit: Payer: Self-pay

## 2019-04-26 ENCOUNTER — Other Ambulatory Visit: Payer: Self-pay

## 2019-04-26 ENCOUNTER — Other Ambulatory Visit (HOSPITAL_COMMUNITY)
Admission: RE | Admit: 2019-04-26 | Discharge: 2019-04-26 | Disposition: A | Discharge status: Home / Self Care | Payer: Medicaid Other | Source: Ambulatory Visit | Attending: Nurse Practitioner | Admitting: Nurse Practitioner

## 2019-04-26 ENCOUNTER — Ambulatory Visit (INDEPENDENT_AMBULATORY_CARE_PROVIDER_SITE_OTHER): Payer: Self-pay | Admitting: Nurse Practitioner

## 2019-04-26 ENCOUNTER — Ambulatory Visit (INDEPENDENT_AMBULATORY_CARE_PROVIDER_SITE_OTHER): Payer: Medicaid Other | Admitting: Nurse Practitioner

## 2019-04-26 ENCOUNTER — Encounter (INDEPENDENT_AMBULATORY_CARE_PROVIDER_SITE_OTHER): Payer: Self-pay | Admitting: Nurse Practitioner

## 2019-04-26 ENCOUNTER — Other Ambulatory Visit (HOSPITAL_COMMUNITY): Payer: Medicaid Other

## 2019-04-26 VITALS — BP 135/77 | HR 62 | Temp 98.2°F | Ht 72.0 in | Wt 184.3 lb

## 2019-04-26 DIAGNOSIS — Z01812 Encounter for preprocedural laboratory examination: Secondary | ICD-10-CM | POA: Diagnosis present

## 2019-04-26 DIAGNOSIS — K222 Esophageal obstruction: Secondary | ICD-10-CM

## 2019-04-26 DIAGNOSIS — Z20828 Contact with and (suspected) exposure to other viral communicable diseases: Secondary | ICD-10-CM | POA: Diagnosis not present

## 2019-04-26 LAB — SARS CORONAVIRUS 2 (TAT 6-24 HRS): SARS Coronavirus 2: NEGATIVE

## 2019-04-26 NOTE — Progress Notes (Signed)
Subjective:    Patient ID: Christian Ellison, male    DOB: 11-22-1960, 58 y.o.   MRN: [id number]  Christian Ellison is a 58 year old male with a past medical history of anxiety, "cardiac arrest" 5/16/201, recurrent aspiration pneumonia 05/2018, 02/13/2019 and 03/11/2019, hypothyroidism, opiate abuse, chronic hepatitis C GT1a treated with Harvoni x 8 weeks 12/2018 and an esophageal stricture. He presents today accompanied by his wife with complains of difficulty swallowing, food passes down the esophagus slowly. Food does not get stuck in the esophagus. He cuts his food in small pieces and chews his food thoroughly. No upper or lower abdominal pain. He is taking Pantoprazole 40mg  bid. He takes ASA 81mg  once daily. He was admitted to [facility] 03/11/2019 with sepsis secondary to double pneumonia confirmed by CTA. He received IV Unasyn, Vancomycin and bronchodilators. He was discharged home with Augmentin 875mg  po bid x 5 days on 03/13/2019.  He also presented to [facility] on 02/13/2019, he was diagnosed with pneumonia and he prescribed Augmentin 875mg  bid x 7 days. He was stable and discharged home as he did not meet criteria for admission. He is scheduled for a sleep study to assess for sleep apnea.   02/23/2019 HCV RNA was undetectable   02/15/2019 abdominal sonogram:  Echogenic parenchyma, likely fatty infiltration though this can be seen with cirrhosis and certain infiltrative disorders. No focal hepatic mass or nodularity. Portal vein is patent on color Doppler imaging with normal direction of blood flow towards the liver. Upper normal gallbladder wall thickness without gallstones or sonographic Murphy sign, nonspecific.  Colonoscopy 10/26/2013 by Dr. Laural Golden: Normal colonoscopy except small external hemorrhoids. 10 yr recall.  Father with colon cancer  Past Medical History:  Diagnosis Date  . Anxiety   . Aspiration pneumonia ([place]) 05/26/2018   RLL  . Chronic pain   . GERD (gastroesophageal reflux  disease)   . 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   Marland Kitchen Opiate abuse, continuous ([place])    Past Surgical History:  Procedure Laterality Date  . BIOPSY  03/18/2018   Procedure: BIOPSY;  Surgeon: Rogene Houston, MD;  Location: [facility];  Service: Endoscopy;;  gastric   . COLONOSCOPY N/A 11/22/2013   Dr. Laural Golden: Normal except for hemorrhoids.  Next colonoscopy 10 years.  . ESOPHAGEAL DILATION N/A 03/18/2018   Procedure: ESOPHAGEAL DILATION;  Surgeon: Rogene Houston, MD;  Location: [facility];  Service: Endoscopy;  Laterality: N/A;  . ESOPHAGEAL MANOMETRY N/A 06/15/2018   Procedure: ESOPHAGEAL MANOMETRY (EM);  Surgeon: Mauri Pole, MD;  Location: [facility];  Service: Endoscopy;  Laterality: N/A;  . ESOPHAGOGASTRODUODENOSCOPY (EGD) WITH PROPOFOL N/A 03/18/2018   Dr. Laural Golden: Benign-appearing mild esophageal stenosis proximal to the GE J, status post dilation.  2 cm hiatal hernia.  Gastritis, biopsies negative for H. pylori  . Fracture Right Leg     Patient has rod/screws in this leg  . Rotor Cuff  2012   Right Shoulder   Current Outpatient Medications on File Prior to Visit  Medication Sig Dispense Refill  . albuterol (VENTOLIN HFA) 108 (90 Base) MCG/ACT inhaler Inhale 2 puffs into the lungs every 6 (six) hours as needed for wheezing or shortness of breath (cough). 18 g 1  . ALPRAZolam (XANAX) 0.5 MG tablet Take 0.5 mg by mouth 2 (two) times daily.    Marland Kitchen aspirin EC 81 MG tablet Take 1 tablet (81 mg total) by mouth daily with breakfast. 30 tablet 2  .  atenolol (TENORMIN) 25 MG tablet Take 1 tablet (25 mg total) by mouth daily. 30 tablet 1  . levothyroxine (SYNTHROID, LEVOTHROID) 25 MCG tablet Take 25 mcg by mouth daily.  11  . Multiple Vitamin (MULTIVITAMIN WITH MINERALS) TABS tablet Take 1 tablet by mouth daily. 50+    . pantoprazole (PROTONIX) 40 MG tablet Take 1 tablet (40 mg total) by mouth daily. (Patient taking differently: Take 40 mg by mouth 2 (two) times daily.  ) 30 tablet 2   No current facility-administered medications on file prior to visit.    No Known Allergies   Review of Systems  See HPI, all other systems reviewed and are negative    Objective:   Physical Exam Blood pressure 135/77, pulse 62, temperature 98.2 F (36.8 C), temperature source Oral, height 6' (1.829 m), weight 184 lb 4.8 oz (83.6 kg). General: talkative 58 year old male in NAD Eyes: sclera nonicteric, conjunctiva pink Mouth: poor dentition Neck: suppler, no thyromegaly or lymphadenopathy Heart: RRR, no murmurs Lungs: clear throughout  Abdomen: soft, nontender, no masses or HSM Extremities: no edema Neuro: alert and oriented x 4, no focal deficits     Assessment & Plan:   1. Esophageal dysphagia with history of esophageal stricture -EGD benefits and risks discussed with patient,  I will consult with Dr. Karilyn Cotaehman to discuss patient's  admission [facility] 03/11/2019 with sepsis secondary to double pneumonia to prior to scheduling an EGD.  -Continue Pantoprazole 40mg  po bid  -Patient to proceed with sleep apnea study  -Do not eat within 3 hours of going to bed  2. Recurrent pneumonia, suspect aspiration related   3. History of chronic polysubstance abuse, patient denies any recent drug use since his hospitalization 03/11/2019  4. Chronic Hepatitis C 1a treated with Harvoni x 8 weeks 12/2018. Hep C RNA undetectable 02/23/2019 -due for Hep C RNA quant 05/26/2019  5. Family history of colon cancer. Patient had a normal colonoscopy in 2015. -Dr. Karilyn Cotaehman to verify recall

## 2019-04-26 NOTE — Patient Instructions (Signed)
1. I will call you in 1 or 2 days after I discuss your recent hospitalizations with Dr. Laural Golden. You will need to have another upper endoscopy in the near future.   2. Continue Pantoprazole 40mg  twice daily  3. Do not eat within 3 hours of going to bed

## 2019-04-27 ENCOUNTER — Other Ambulatory Visit: Payer: Self-pay

## 2019-04-27 ENCOUNTER — Ambulatory Visit: Payer: Medicaid Other | Attending: Sports Medicine | Admitting: Neurology

## 2019-04-27 DIAGNOSIS — Z79899 Other long term (current) drug therapy: Secondary | ICD-10-CM | POA: Insufficient documentation

## 2019-04-27 DIAGNOSIS — G4730 Sleep apnea, unspecified: Secondary | ICD-10-CM

## 2019-04-27 DIAGNOSIS — Z7982 Long term (current) use of aspirin: Secondary | ICD-10-CM | POA: Insufficient documentation

## 2019-04-27 DIAGNOSIS — G4733 Obstructive sleep apnea (adult) (pediatric): Secondary | ICD-10-CM | POA: Insufficient documentation

## 2019-04-29 ENCOUNTER — Other Ambulatory Visit (INDEPENDENT_AMBULATORY_CARE_PROVIDER_SITE_OTHER): Payer: Self-pay | Admitting: Internal Medicine

## 2019-04-29 MED ORDER — DILTIAZEM HCL 60 MG PO TABS: 60.0000 mg | ORAL_TABLET | Freq: Three times a day (TID) | ORAL | 5 refills | Status: DC

## 2019-04-29 NOTE — Progress Notes (Signed)
Patient called me back. He does not remember if he had any relief with Cardizem.  He deftly did not refill the medication. We will start patient on Cardizem 60 mg by mouth 30 minutes before each meal. Patient told me that he has sleep apnea and is being set up for CPAP machine. Patient will need follow-up visit in 1 month.

## 2019-04-30 NOTE — Procedures (Signed)
[place] A. Merlene Laughter, MD     [email or web address]             NOCTURNAL POLYSOMNOGRAPHY   LOCATION: [facility]  Patient Name: Christian Ellison, Christian Ellison Date: 04/27/2019 Gender: Male D.O.B: [birth year] Age (years): 58 Referring Provider: Leonie Douglas Height (inches): 72 Interpreting Physician: Phillips Odor MD, ABSM Weight (lbs): 184 RPSGT: Peak, Robert BMI: 25 MRN: [id number] Neck Size: 15.50 CLINICAL INFORMATION Sleep Study Type: NPSG     Indication for sleep study: N/A     Epworth Sleepiness Score: 7     SLEEP STUDY TECHNIQUE As per the AASM Manual for the Scoring of Sleep and Associated Events v2.3 (April 2016) with a hypopnea requiring 4% desaturations.  The channels recorded and monitored were frontal, central and occipital EEG, electrooculogram (EOG), submentalis EMG (chin), nasal and oral airflow, thoracic and abdominal wall motion, anterior tibialis EMG, snore microphone, electrocardiogram, and pulse oximetry.  MEDICATIONS Medications self-administered by patient taken the night of the study : N/A  Current Outpatient Medications:  .  albuterol (VENTOLIN HFA) 108 (90 Base) MCG/ACT inhaler, Inhale 2 puffs into the lungs every 6 (six) hours as needed for wheezing or shortness of breath (cough)., Disp: 18 g, Rfl: 1 .  ALPRAZolam (XANAX) 0.5 MG tablet, Take 0.5 mg by mouth 2 (two) times daily., Disp: , Rfl:  .  aspirin EC 81 MG tablet, Take 1 tablet (81 mg total) by mouth daily with breakfast., Disp: 30 tablet, Rfl: 2 .  atenolol (TENORMIN) 25 MG tablet, Take 1 tablet (25 mg total) by mouth daily., Disp: 30 tablet, Rfl: 1 .  diltiazem (CARDIZEM) 60 MG tablet, Take 1 tablet (60 mg total) by mouth 3 (three) times daily before meals., Disp: 90 tablet, Rfl: 5 .  levothyroxine (SYNTHROID, LEVOTHROID) 25 MCG tablet, Take 25 mcg by mouth daily., Disp: , Rfl: 11 .  Multiple Vitamin (MULTIVITAMIN WITH MINERALS) TABS tablet, Take 1 tablet by mouth  daily. 50+, Disp: , Rfl:  .  pantoprazole (PROTONIX) 40 MG tablet, Take 1 tablet (40 mg total) by mouth daily. (Patient taking differently: Take 40 mg by mouth 2 (two) times daily. ), Disp: 30 tablet, Rfl: 2     SLEEP ARCHITECTURE The study was initiated at 10:12:28 PM and ended at 4:24:49 AM.  Sleep onset time was 31.8 minutes and the sleep efficiency was 57.3%%. The total sleep time was 213.5 minutes.  Stage REM latency was 235.5 minutes.  The patient spent 9.4%% of the night in stage N1 sleep, 76.6%% in stage N2 sleep, 0.0%% in stage N3 and 14.1% in REM.  Alpha intrusion was absent.  Supine sleep was 18.04%.  RESPIRATORY PARAMETERS The overall apnea/hypopnea index (AHI) was 49.5 per hour. There were 56 total apneas, including 4 obstructive, 52 central and 0 mixed apneas. There were 120 hypopneas and 0 RERAs.  The AHI during Stage REM sleep was 48.0 per hour.  AHI while supine was 88.8 per hour.  The mean oxygen saturation was 90.3%. The minimum SpO2 during sleep was 84.0%.  loud snoring was noted during this study.  CARDIAC DATA The 2 lead EKG demonstrated sinus rhythm. The mean heart rate was 47.2 beats per minute. Other EKG findings include: None.  LEG MOVEMENT DATA The total PLMS were 0 with a resulting PLMS index of 0.0. Associated arousal with leg movement index was 0.0.  IMPRESSIONS 1.  Severe obstructive sleep apnea is documented with this study.  AutoPap 8-14 is recommended. 2.  Absent slow-wave sleep is  also noted.   Argie RammingKofi A Alvis Edgell, MD Diplomate, American Board of Sleep Medicine.  ELECTRONICALLY SIGNED ON:  04/30/2019, 6:12 PM [facility] PH: [phone]   FX: [phone] ACCREDITED BY THE AMERICAN ACADEMY OF SLEEP MEDICINE

## 2019-05-01 ENCOUNTER — Telehealth (INDEPENDENT_AMBULATORY_CARE_PROVIDER_SITE_OTHER): Payer: Self-pay | Admitting: *Deleted

## 2019-05-01 NOTE — Telephone Encounter (Signed)
Patient was called and medication that was prescribed was discussed.

## 2019-05-01 NOTE — Telephone Encounter (Signed)
Please call Estill Bamberg @ [phone] -- they have a question about medication Dr Laural Golden talked to him about over the weekend

## 2019-05-04 ENCOUNTER — Emergency Department (HOSPITAL_COMMUNITY): Payer: Self-pay

## 2019-05-04 ENCOUNTER — Emergency Department (HOSPITAL_COMMUNITY)
Admission: EM | Admit: 2019-05-04 | Discharge: 2019-05-05 | Disposition: A | Discharge status: Home / Self Care | Payer: Self-pay | Attending: Emergency Medicine | Admitting: Emergency Medicine

## 2019-05-04 ENCOUNTER — Encounter (HOSPITAL_COMMUNITY): Payer: Self-pay | Admitting: Emergency Medicine

## 2019-05-04 ENCOUNTER — Other Ambulatory Visit: Payer: Self-pay

## 2019-05-04 DIAGNOSIS — R4182 Altered mental status, unspecified: Secondary | ICD-10-CM | POA: Insufficient documentation

## 2019-05-04 DIAGNOSIS — Z79899 Other long term (current) drug therapy: Secondary | ICD-10-CM | POA: Insufficient documentation

## 2019-05-04 DIAGNOSIS — I509 Heart failure, unspecified: Secondary | ICD-10-CM | POA: Insufficient documentation

## 2019-05-04 DIAGNOSIS — Z7982 Long term (current) use of aspirin: Secondary | ICD-10-CM | POA: Insufficient documentation

## 2019-05-04 DIAGNOSIS — R0602 Shortness of breath: Secondary | ICD-10-CM

## 2019-05-04 DIAGNOSIS — Z20828 Contact with and (suspected) exposure to other viral communicable diseases: Secondary | ICD-10-CM | POA: Insufficient documentation

## 2019-05-04 DIAGNOSIS — R0902 Hypoxemia: Secondary | ICD-10-CM | POA: Insufficient documentation

## 2019-05-04 LAB — CBC WITH DIFFERENTIAL/PLATELET
Abs Immature Granulocytes: 0.02 10*3/uL (ref 0.00–0.07)
Basophils Absolute: 0 10*3/uL (ref 0.0–0.1)
Basophils Relative: 0 %
Eosinophils Absolute: 0.2 10*3/uL (ref 0.0–0.5)
Eosinophils Relative: 4 %
HCT: 34 % — ABNORMAL LOW (ref 39.0–52.0)
Hemoglobin: 10.9 g/dL — ABNORMAL LOW (ref 13.0–17.0)
Immature Granulocytes: 0 %
Lymphocytes Relative: 20 %
Lymphs Abs: 1.1 10*3/uL (ref 0.7–4.0)
MCH: 30.3 pg (ref 26.0–34.0)
MCHC: 32.1 g/dL (ref 30.0–36.0)
MCV: 94.4 fL (ref 80.0–100.0)
Monocytes Absolute: 0.3 10*3/uL (ref 0.1–1.0)
Monocytes Relative: 6 %
Neutro Abs: 3.7 10*3/uL (ref 1.7–7.7)
Neutrophils Relative %: 70 %
Platelets: 185 10*3/uL (ref 150–400)
RBC: 3.6 MIL/uL — ABNORMAL LOW (ref 4.22–5.81)
RDW: 12.5 % (ref 11.5–15.5)
WBC: 5.3 10*3/uL (ref 4.0–10.5)
nRBC: 0 % (ref 0.0–0.2)

## 2019-05-04 LAB — BASIC METABOLIC PANEL
Anion gap: 7 (ref 5–15)
BUN: 15 mg/dL (ref 6–20)
CO2: 28 mmol/L (ref 22–32)
Calcium: 8.6 mg/dL — ABNORMAL LOW (ref 8.9–10.3)
Chloride: 105 mmol/L (ref 98–111)
Creatinine, Ser: 0.81 mg/dL (ref 0.61–1.24)
GFR calc Af Amer: >60 mL/min (ref >60)
GFR calc non Af Amer: >60 mL/min (ref >60)
Glucose, Bld: 154 mg/dL — ABNORMAL HIGH (ref 70–99)
Potassium: 3.2 mmol/L — ABNORMAL LOW (ref 3.5–5.1)
Sodium: 140 mmol/L (ref 135–145)

## 2019-05-04 MED ORDER — ALBUTEROL SULFATE HFA 108 (90 BASE) MCG/ACT IN AERS
2.0000 | INHALATION_SPRAY | RESPIRATORY_TRACT | Status: DC | PRN
  Administered 2019-05-04: 23:00:00 2 via RESPIRATORY_TRACT
  Filled 2019-05-04: qty 6.7

## 2019-05-04 NOTE — ED Triage Notes (Addendum)
Patient's wife states patient's O2 saturation was 76% at home today. O2 83% on room air in triage. Denies cough, congestion, or fever. Patient falling asleep in triage and slurring words. States "I took one oxycodone hours ago."

## 2019-05-04 NOTE — ED Notes (Addendum)
Urinal provided and informed the patient urine specimen is needed.

## 2019-05-04 NOTE — ED Provider Notes (Signed)
[facility] EMERGENCY DEPARTMENT Provider Note   CSN: [id number] Arrival date & time: 05/04/19  2132     History   Chief Complaint Chief Complaint  Patient presents with  . Abnormal Lab    HPI Christian EnsignJohn G Noorani is a 58 y.o. male.     HPI Patient presents with shortness of breath.  Decreased mental status.  Was hypoxic at home.  Reported sats in the 70s.  Sats of 83 upon arrival.  History of pneumonias.  Recurrent aspiration pneumonia.  Patient's wife states he has had that 14 times.  Reportedly more confused today.  Patient is on chronic Xanax.  History of opiate abuse but states has been clean for 2 years.  States he did take oxycodone today but it was just 1 that he had leftover.  States he does not use any opiates.  No chest pain.  No abdominal pain.  No swelling in his legs. Past Medical History:  Diagnosis Date  . Anxiety   . Aspiration pneumonia (HCC) 05/26/2018   RLL  . Chronic pain   . GERD (gastroesophageal reflux disease)   . 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   Marland Kitchen. Opiate abuse, continuous [facility](HCC)     Patient Active Problem List   Diagnosis Date Noted  . Multifocal pneumonia 03/11/2019  . Acute respiratory failure with hypoxemia (HCC) 03/11/2019  . Sinus tachycardia 03/11/2019  . Sepsis due to pneumonia (HCC) 03/11/2019  . Hypothyroidism 03/11/2019  . Bronchopneumonia 11/06/2018  . Acute Respiratory failure with hypoxia (2/2 Asp PNA) 11/06/2018  . Recurrent aspiration pneumonia (HCC)   . Fever 06/09/2018  . Tachycardia 06/09/2018  . Hypoxia 06/09/2018  . Hypotension 06/09/2018  . Esophageal dysmotility/Esophageal stricture/stenosis/esophageal dysmotility with recurrent aspiration   . Aspiration pneumonia of right lower lobe (HCC)   . Sepsis (HCC) 05/26/2018  . Pulmonary edema 02/02/2018  . HCAP (healthcare-associated pneumonia) 02/02/2018  . Weakness generalized 02/02/2018  . Hypertension 02/02/2018  . CHF (congestive heart failure) (HCC)  02/02/2018  . Nausea & vomiting 02/02/2018  . Dysphagia 02/02/2018  . Esophageal stricture/stenosis/esophageal dysmotility with recurrent aspiration 02/02/2018  . Esophageal stenosis 02/02/2018  . GERD (gastroesophageal reflux disease) 02/02/2018  . Polysubstance abuse (HCC) 02/02/2018  . Acute respiratory failure with hypoxia (HCC)   . Transaminitis   . Elevated troponin   . Overdose 01/02/2018  . Hx of hepatitis C 01/17/2013  . Depression 01/17/2013  . Anxiety with depression 01/17/2013  . Right knee pain 01/17/2013    Past Surgical History:  Procedure Laterality Date  . BIOPSY  03/18/2018   Procedure: BIOPSY;  Surgeon: Malissa Hippoehman, Najeeb U, MD;  [facility];  Service: Endoscopy;;  gastric   . COLONOSCOPY N/A 11/22/2013   Dr. Karilyn Cotaehman: Normal except for hemorrhoids.  Next colonoscopy 10 years.  . ESOPHAGEAL DILATION N/A 03/18/2018   Procedure: ESOPHAGEAL DILATION;  Surgeon: Malissa Hippoehman, Najeeb U, MD;  [facility];  Service: Endoscopy;  Laterality: N/A;  . ESOPHAGEAL MANOMETRY N/A 06/15/2018   Procedure: ESOPHAGEAL MANOMETRY (EM);  Surgeon: Napoleon FormNandigam, Kavitha V, MD;  [facility];  Service: Endoscopy;  Laterality: N/A;  . ESOPHAGOGASTRODUODENOSCOPY (EGD) WITH PROPOFOL N/A 03/18/2018   Dr. Karilyn Cotaehman: Benign-appearing mild esophageal stenosis proximal to the GE J, status post dilation.  2 cm hiatal hernia.  Gastritis, biopsies negative for H. pylori  . Fracture Right Leg     Patient has rod/screws in this leg  . Rotor Cuff  2012   Right Shoulder  Home Medications    Prior to Admission medications   Medication Sig Start Date End Date Taking? Authorizing Provider  albuterol (VENTOLIN HFA) 108 (90 Base) MCG/ACT inhaler Inhale 2 puffs into the lungs every 6 (six) hours as needed for wheezing or shortness of breath (cough). 03/13/19   Roxan Hockey, MD  ALPRAZolam Duanne Moron) 0.5 MG tablet Take 0.5 mg by mouth 2 (two) times daily.    [provider]   aspirin EC 81 MG tablet Take 1 tablet (81 mg total) by mouth daily with breakfast. 03/13/19   Denton Brick, Courage, MD  atenolol (TENORMIN) 25 MG tablet Take 1 tablet (25 mg total) by mouth daily. 03/13/19   Roxan Hockey, MD  diltiazem (CARDIZEM) 60 MG tablet Take 1 tablet (60 mg total) by mouth 3 (three) times daily before meals. 04/29/19   Rehman, Mechele Dawley, MD  levothyroxine (SYNTHROID, LEVOTHROID) 25 MCG tablet Take 25 mcg by mouth daily. 05/13/18   [provider]  Multiple Vitamin (MULTIVITAMIN WITH MINERALS) TABS tablet Take 1 tablet by mouth daily. 50+    [provider]  pantoprazole (PROTONIX) 40 MG tablet Take 1 tablet (40 mg total) by mouth daily. Patient taking differently: Take 40 mg by mouth 2 (two) times daily.  03/13/19 03/12/20  Roxan Hockey, MD    Family History Family History  Problem Relation Age of Onset  . Breast cancer Mother   . Colon cancer Father   . Bladder Cancer Father   . Prostate cancer Father   . Hypertension Sister   . Hypothyroidism Sister   . Healthy Sister   . Healthy Daughter   . Healthy Son   . Healthy Son     Social History Social History   Tobacco Use  . Smoking status: Never Smoker  . Smokeless tobacco: Never Used  Substance Use Topics  . Alcohol use: No    Comment: Patient states that it has been over a year  . Drug use: Yes    Types: Benzodiazepines    Comment: opiates     Allergies   Patient has no known allergies.   Review of Systems Review of Systems  Constitutional: Negative for appetite change.  HENT: Negative for congestion.   Respiratory: Positive for cough. Negative for shortness of breath.   Cardiovascular: Negative for chest pain.  Gastrointestinal: Negative for abdominal pain.  Musculoskeletal: Negative for back pain.  Skin: Negative for rash.  Neurological: Positive for weakness.  Psychiatric/Behavioral: Positive for confusion. The patient is nervous/anxious.      Physical Exam Updated  Vital Signs BP 110/63   Pulse 74   Temp 97.6 F (36.4 C) (Oral)   Resp 16   Ht 6' (1.829 m)   Wt 88.5 kg   SpO2 94%   BMI 26.45 kg/m   Physical Exam Vitals signs and nursing note reviewed.  HENT:     Head: Normocephalic.  Eyes:     Comments: Pupils somewhat constricted  Cardiovascular:     Rate and Rhythm: Normal rate and regular rhythm.  Pulmonary:     Comments: Somewhat harsh breath sounds with some rales particularly on the right side. Abdominal:     Tenderness: There is no abdominal tenderness.  Musculoskeletal:     Right lower leg: No edema.     Left lower leg: No edema.  Skin:    General: Skin is warm.     Capillary Refill: Capillary refill takes less than 2 seconds.  Neurological:     Mental Status: He  is alert.     Comments: Is held somewhat closed.  Mildly slowed answers but otherwise appropriate.      ED Treatments / Results  Labs (all labs ordered are listed, but only abnormal results are displayed) Labs Reviewed  BASIC METABOLIC PANEL - Abnormal; Notable for the following components:      Result Value   Potassium 3.2 (*)    Glucose, Bld 154 (*)    Calcium 8.6 (*)    All other components within normal limits  CBC WITH DIFFERENTIAL/PLATELET - Abnormal; Notable for the following components:   RBC 3.60 (*)    Hemoglobin 10.9 (*)    HCT 34.0 (*)    All other components within normal limits  SARS CORONAVIRUS 2 (TAT 6-24 HRS)  RAPID URINE DRUG SCREEN, HOSP PERFORMED    EKG None  Radiology Dg Chest Portable 1 View  Result Date: 05/04/2019 CLINICAL DATA:  Short of breath EXAM: PORTABLE CHEST 1 VIEW COMPARISON:  02/13/2019, 03/11/2019 FINDINGS: The heart size and mediastinal contours are within normal limits. Both lungs are clear. The visualized skeletal structures are unremarkable. IMPRESSION: No active disease. Electronically Signed   By: Jasmine Pang M.D.   On: 05/04/2019 22:44    Procedures Procedures (including critical care time)   Medications Ordered in ED Medications  albuterol (VENTOLIN HFA) 108 (90 Base) MCG/ACT inhaler 2 puff (2 puffs Inhalation Given 05/04/19 2255)     Initial Impression / Assessment and Plan / ED Course  I have reviewed the triage vital signs and the nursing notes.  Pertinent labs & imaging results that were available during my care of the patient were reviewed by me and considered in my medical decision making (see chart for details).        Patient presents with hypoxia.  Checked at home and reportedly had sats in the 70s was in the 80s upon arrival.  Somewhat harsh breath sounds.  States he has a history of aspiration pneumonia.  No real coughing.  Former opiate abuse but states that he has been clean for 2 years but did take some Norco that he had at home today.  X-ray reassuring.  Patient was slurring his words and urine drug screen still pending.  However patient was initially hypoxic.  Inhaler used.  Care turned over to Dr. Lynelle Doctor.  Final Clinical Impressions(s) / ED Diagnoses   Final diagnoses:  Hypoxia    ED Discharge Orders    None       Benjiman Core, MD 05/04/19 2349

## 2019-05-05 LAB — RAPID URINE DRUG SCREEN, HOSP PERFORMED
Amphetamines: NOT DETECTED
Barbiturates: NOT DETECTED
Benzodiazepines: POSITIVE — AB
Cocaine: NOT DETECTED
Opiates: POSITIVE — AB
Tetrahydrocannabinol: NOT DETECTED

## 2019-05-05 LAB — SARS CORONAVIRUS 2 (TAT 6-24 HRS): SARS Coronavirus 2: NEGATIVE

## 2019-05-05 LAB — LACTIC ACID, PLASMA: Lactic Acid, Venous: 1.1 mmol/L (ref 0.5–1.9)

## 2019-05-05 MED ORDER — AMOXICILLIN-POT CLAVULANATE 875-125 MG PO TABS: 1.0000 | ORAL_TABLET | Freq: Two times a day (BID) | ORAL | 0 refills | Status: DC

## 2019-05-05 MED ORDER — CLINDAMYCIN PHOSPHATE 600 MG/50ML IV SOLN
600.0000 mg | Freq: Once | INTRAVENOUS | Status: AC
  Administered 2019-05-05: 600 mg via INTRAVENOUS
  Filled 2019-05-05: qty 50

## 2019-05-05 MED ORDER — SODIUM CHLORIDE 0.9 % IV BOLUS
1000.0000 mL | Freq: Once | INTRAVENOUS | Status: AC
  Administered 2019-05-05: 1000 mL via INTRAVENOUS

## 2019-05-05 NOTE — ED Provider Notes (Signed)
Patient was left a change of shift to get his test results.  I was told by nursing staff he was wanting to leave AMA.  When I go in the to the room he is there with his wife.  She is shaking her head no that he is not leaving.  He states he has a "shy bladder" and he cannot pee.  He was given a liter normal saline.  At this point he is agreeable to staying and finishing his treatment.  Recheck at 2:40 AM nurse reports patient had gotten up to use the bathroom and his pulse ox was 88% on room air.  When I talked to the patient I turned off his oxygen.  He maintained his pulse ox above 92% for 8 minutes.  His wife has a portable pulse ox monitor and when we compare it to ours it reads about 2-3 percentage points lower.  We discussed that she should add 2-3 points to the pulse ox result.  The heart rate part was accurate.  He is agreeable to going home.  He was sent home on Augmentin which she states works very well.  Patient states he has been admitted 14 times with aspiration pneumonia.  He just finished a sleep study and is in the process of getting a CPAP machine.  He states he has some type of esophageal stricture that increases his risk of aspiration.  Results for orders placed or performed during the hospital encounter of 05/04/19  Basic metabolic panel  Result Value Ref Range   Sodium 140 135 - 145 mmol/L   Potassium 3.2 (L) 3.5 - 5.1 mmol/L   Chloride 105 98 - 111 mmol/L   CO2 28 22 - 32 mmol/L   Glucose, Bld 154 (H) 70 - 99 mg/dL   BUN 15 6 - 20 mg/dL   Creatinine, Ser 9.600.81 0.61 - 1.24 mg/dL   Calcium 8.6 (L) 8.9 - 10.3 mg/dL   GFR calc non Af Amer >60 >60 mL/min   GFR calc Af Amer >60 >60 mL/min   Anion gap 7 5 - 15  Urine rapid drug screen (hosp performed)  Result Value Ref Range   Opiates POSITIVE (A) NONE DETECTED   Cocaine NONE DETECTED NONE DETECTED   Benzodiazepines POSITIVE (A) NONE DETECTED   Amphetamines NONE DETECTED NONE DETECTED   Tetrahydrocannabinol NONE DETECTED NONE  DETECTED   Barbiturates NONE DETECTED NONE DETECTED  CBC with Differential  Result Value Ref Range   WBC 5.3 4.0 - 10.5 K/uL   RBC 3.60 (L) 4.22 - 5.81 MIL/uL   Hemoglobin 10.9 (L) 13.0 - 17.0 g/dL   HCT 45.434.0 (L) 09.839.0 - 11.952.0 %   MCV 94.4 80.0 - 100.0 fL   MCH 30.3 26.0 - 34.0 pg   MCHC 32.1 30.0 - 36.0 g/dL   RDW 14.712.5 82.911.5 - 56.215.5 %   Platelets 185 150 - 400 K/uL   nRBC 0.0 0.0 - 0.2 %   Neutrophils Relative % 70 %   Neutro Abs 3.7 1.7 - 7.7 K/uL   Lymphocytes Relative 20 %   Lymphs Abs 1.1 0.7 - 4.0 K/uL   Monocytes Relative 6 %   Monocytes Absolute 0.3 0.1 - 1.0 K/uL   Eosinophils Relative 4 %   Eosinophils Absolute 0.2 0.0 - 0.5 K/uL   Basophils Relative 0 %   Basophils Absolute 0.0 0.0 - 0.1 K/uL   Immature Granulocytes 0 %   Abs Immature Granulocytes 0.02 0.00 - 0.07 K/uL  Lactic  acid, plasma  Result Value Ref Range   Lactic Acid, Venous 1.1 0.5 - 1.9 mmol/L   Laboratory interpretation all normal except hypokalemia, mild anemia, hypokalemia   Dg Chest Portable 1 View  Result Date: 05/04/2019 CLINICAL DATA:  Short of breath EXAM: PORTABLE CHEST 1 VIEW COMPARISON:  02/13/2019, 03/11/2019 FINDINGS: The heart size and mediastinal contours are within normal limits. Both lungs are clear. The visualized skeletal structures are unremarkable. IMPRESSION: No active disease. Electronically Signed   By: Donavan Foil M.D.   On: 05/04/2019 22:44    EKG Interpretation  Date/Time:  Thursday May 04 2019 22:22:44 EDT Ventricular Rate:  76 PR Interval:    QRS Duration: 95 QT Interval:  425 QTC Calculation: 478 R Axis:   16 Text Interpretation:  Sinus rhythm Low voltage, precordial leads Borderline T abnormalities, anterior leads Borderline prolonged QT interval Since last tracing rate slower 11 Mar 2019 Confirmed by Rolland Porter [phone]) on 05/05/2019 1:50:37 AM       Diagnoses that have been ruled out:  None  Diagnoses that are still under consideration:  None  Final  diagnoses:  Hypoxia  Shortness of breath    ED Discharge Orders         Ordered    amoxicillin-clavulanate (AUGMENTIN) 875-125 MG tablet  2 times daily     05/05/19 0307         Plan discharge  Rolland Porter, MD, Barbette Or, MD 05/05/19 [phone]

## 2019-05-05 NOTE — ED Notes (Signed)
Dr Knapp at bedside,  

## 2019-05-05 NOTE — Discharge Instructions (Addendum)
Take the antibiotic until gone.  Monitor your oxygen level, add 2-3 percentage points to your monitor.  Consider having it looked at at [facility] in [place], they could probably help you change the battery.  Return to the hospital if you feel worse or your oxygen stays below 90%.  Let your primary care doctor know about your ED visit tonight.

## 2019-05-05 NOTE — ED Notes (Signed)
Pt got out of bed to urinate, pulse ox dropped to 88% on room air, pt denies any sob, states " I am actually feeling better" pt assisted back to bed, oxygen placed back on pt,

## 2019-05-10 LAB — CULTURE, BLOOD (ROUTINE X 2)
Culture: NO GROWTH
Culture: NO GROWTH
Special Requests: ADEQUATE
Special Requests: ADEQUATE

## 2019-05-15 ENCOUNTER — Emergency Department (HOSPITAL_COMMUNITY): Payer: Medicaid Other

## 2019-05-15 ENCOUNTER — Other Ambulatory Visit: Payer: Self-pay

## 2019-05-15 ENCOUNTER — Inpatient Hospital Stay (HOSPITAL_COMMUNITY)
Admission: EM | Admit: 2019-05-15 | Discharge: 2019-05-17 | DRG: 871 | Disposition: A | Discharge status: Home / Self Care | Payer: Medicaid Other | Attending: Family Medicine | Admitting: Family Medicine

## 2019-05-15 ENCOUNTER — Encounter (HOSPITAL_COMMUNITY): Payer: Self-pay | Admitting: *Deleted

## 2019-05-15 DIAGNOSIS — K219 Gastro-esophageal reflux disease without esophagitis: Secondary | ICD-10-CM | POA: Diagnosis present

## 2019-05-15 DIAGNOSIS — G8929 Other chronic pain: Secondary | ICD-10-CM | POA: Diagnosis present

## 2019-05-15 DIAGNOSIS — A419 Sepsis, unspecified organism: Principal | ICD-10-CM | POA: Diagnosis present

## 2019-05-15 DIAGNOSIS — Z79899 Other long term (current) drug therapy: Secondary | ICD-10-CM

## 2019-05-15 DIAGNOSIS — J189 Pneumonia, unspecified organism: Secondary | ICD-10-CM | POA: Diagnosis present

## 2019-05-15 DIAGNOSIS — F1320 Sedative, hypnotic or anxiolytic dependence, uncomplicated: Secondary | ICD-10-CM | POA: Diagnosis present

## 2019-05-15 DIAGNOSIS — Z7982 Long term (current) use of aspirin: Secondary | ICD-10-CM

## 2019-05-15 DIAGNOSIS — Z8042 Family history of malignant neoplasm of prostate: Secondary | ICD-10-CM

## 2019-05-15 DIAGNOSIS — Z20828 Contact with and (suspected) exposure to other viral communicable diseases: Secondary | ICD-10-CM | POA: Diagnosis present

## 2019-05-15 DIAGNOSIS — G4733 Obstructive sleep apnea (adult) (pediatric): Secondary | ICD-10-CM | POA: Diagnosis present

## 2019-05-15 DIAGNOSIS — Z8249 Family history of ischemic heart disease and other diseases of the circulatory system: Secondary | ICD-10-CM

## 2019-05-15 DIAGNOSIS — Z803 Family history of malignant neoplasm of breast: Secondary | ICD-10-CM

## 2019-05-15 DIAGNOSIS — I10 Essential (primary) hypertension: Secondary | ICD-10-CM | POA: Diagnosis present

## 2019-05-15 DIAGNOSIS — Z8619 Personal history of other infectious and parasitic diseases: Secondary | ICD-10-CM

## 2019-05-15 DIAGNOSIS — Z79890 Hormone replacement therapy (postmenopausal): Secondary | ICD-10-CM

## 2019-05-15 DIAGNOSIS — J9601 Acute respiratory failure with hypoxia: Secondary | ICD-10-CM | POA: Diagnosis present

## 2019-05-15 DIAGNOSIS — Z8701 Personal history of pneumonia (recurrent): Secondary | ICD-10-CM

## 2019-05-15 DIAGNOSIS — Z8052 Family history of malignant neoplasm of bladder: Secondary | ICD-10-CM

## 2019-05-15 DIAGNOSIS — E039 Hypothyroidism, unspecified: Secondary | ICD-10-CM | POA: Diagnosis present

## 2019-05-15 DIAGNOSIS — Z800 Family history of malignant neoplasm of digestive organs: Secondary | ICD-10-CM

## 2019-05-15 LAB — RAPID URINE DRUG SCREEN, HOSP PERFORMED
Amphetamines: POSITIVE — AB
Barbiturates: NOT DETECTED
Benzodiazepines: POSITIVE — AB
Cocaine: NOT DETECTED
Opiates: NOT DETECTED
Tetrahydrocannabinol: NOT DETECTED

## 2019-05-15 LAB — CBC WITH DIFFERENTIAL/PLATELET
Abs Immature Granulocytes: 0.03 10*3/uL (ref 0.00–0.07)
Basophils Absolute: 0 10*3/uL (ref 0.0–0.1)
Basophils Relative: 0 %
Eosinophils Absolute: 0.1 10*3/uL (ref 0.0–0.5)
Eosinophils Relative: 1 %
HCT: 42.7 % (ref 39.0–52.0)
Hemoglobin: 13.9 g/dL (ref 13.0–17.0)
Immature Granulocytes: 0 %
Lymphocytes Relative: 9 %
Lymphs Abs: 0.8 10*3/uL (ref 0.7–4.0)
MCH: 29.8 pg (ref 26.0–34.0)
MCHC: 32.6 g/dL (ref 30.0–36.0)
MCV: 91.4 fL (ref 80.0–100.0)
Monocytes Absolute: 0.4 10*3/uL (ref 0.1–1.0)
Monocytes Relative: 4 %
Neutro Abs: 8.1 10*3/uL — ABNORMAL HIGH (ref 1.7–7.7)
Neutrophils Relative %: 86 %
Platelets: 191 10*3/uL (ref 150–400)
RBC: 4.67 MIL/uL (ref 4.22–5.81)
RDW: 12.6 % (ref 11.5–15.5)
WBC: 9.5 10*3/uL (ref 4.0–10.5)
nRBC: 0 % (ref 0.0–0.2)

## 2019-05-15 LAB — URINALYSIS, ROUTINE W REFLEX MICROSCOPIC
Bacteria, UA: NONE SEEN
Bilirubin Urine: NEGATIVE
Glucose, UA: NEGATIVE mg/dL
Ketones, ur: NEGATIVE mg/dL
Leukocytes,Ua: NEGATIVE
Nitrite: NEGATIVE
Protein, ur: NEGATIVE mg/dL
Specific Gravity, Urine: 1.009 (ref 1.005–1.030)
pH: 5 (ref 5.0–8.0)

## 2019-05-15 LAB — COMPREHENSIVE METABOLIC PANEL
ALT: 15 U/L (ref 0–44)
AST: 28 U/L (ref 15–41)
Albumin: 4.2 g/dL (ref 3.5–5.0)
Alkaline Phosphatase: 53 U/L (ref 38–126)
Anion gap: 12 (ref 5–15)
BUN: 10 mg/dL (ref 6–20)
CO2: 26 mmol/L (ref 22–32)
Calcium: 9 mg/dL (ref 8.9–10.3)
Chloride: 100 mmol/L (ref 98–111)
Creatinine, Ser: 0.72 mg/dL (ref 0.61–1.24)
GFR calc Af Amer: >60 mL/min (ref >60)
GFR calc non Af Amer: >60 mL/min (ref >60)
Glucose, Bld: 124 mg/dL — ABNORMAL HIGH (ref 70–99)
Potassium: 4.2 mmol/L (ref 3.5–5.1)
Sodium: 138 mmol/L (ref 135–145)
Total Bilirubin: 0.8 mg/dL (ref 0.3–1.2)
Total Protein: 7.3 g/dL (ref 6.5–8.1)

## 2019-05-15 LAB — LACTIC ACID, PLASMA
Lactic Acid, Venous: 2.7 mmol/L (ref 0.5–1.9)
Lactic Acid, Venous: 2.5 mmol/L (ref 0.5–1.9)

## 2019-05-15 LAB — TROPONIN I (HIGH SENSITIVITY)
Troponin I (High Sensitivity): 4 ng/L (ref <18)
Troponin I (High Sensitivity): 10 ng/L (ref <18)

## 2019-05-15 LAB — BRAIN NATRIURETIC PEPTIDE: B Natriuretic Peptide: 49 pg/mL (ref 0.0–100.0)

## 2019-05-15 LAB — ETHANOL: Alcohol, Ethyl (B): <10 mg/dL (ref <10)

## 2019-05-15 LAB — SARS CORONAVIRUS 2 BY RT PCR (HOSPITAL ORDER, PERFORMED IN ~~LOC~~ HOSPITAL LAB): SARS Coronavirus 2: NEGATIVE

## 2019-05-15 MED ORDER — SODIUM CHLORIDE 0.9 % IV BOLUS
1000.0000 mL | Freq: Once | INTRAVENOUS | Status: AC
  Administered 2019-05-15: 13:00:00 1000 mL via INTRAVENOUS

## 2019-05-15 MED ORDER — LEVOTHYROXINE SODIUM 25 MCG PO TABS
25.0000 ug | ORAL_TABLET | Freq: Every day | ORAL | Status: DC
  Administered 2019-05-16 – 2019-05-17 (×3): 25 ug via ORAL
  Filled 2019-05-15 (×3): qty 1

## 2019-05-15 MED ORDER — ONDANSETRON HCL 4 MG/2ML IJ SOLN: 4.0000 mg | Freq: Four times a day (QID) | INTRAMUSCULAR | Status: DC | PRN

## 2019-05-15 MED ORDER — SODIUM CHLORIDE 0.9 % IV SOLN
1.0000 g | INTRAVENOUS | Status: DC
  Administered 2019-05-15 – 2019-05-16 (×2): 1 g via INTRAVENOUS
  Filled 2019-05-15 (×2): qty 10

## 2019-05-15 MED ORDER — DILTIAZEM HCL ER COATED BEADS 120 MG PO CP24
120.0000 mg | ORAL_CAPSULE | Freq: Every day | ORAL | Status: DC
  Administered 2019-05-16 – 2019-05-17 (×2): 120 mg via ORAL
  Filled 2019-05-15 (×3): qty 1

## 2019-05-15 MED ORDER — HEPARIN SODIUM (PORCINE) 5000 UNIT/ML IJ SOLN
5000.0000 [IU] | Freq: Three times a day (TID) | INTRAMUSCULAR | Status: DC
  Administered 2019-05-15 – 2019-05-17 (×6): 5000 [IU] via SUBCUTANEOUS
  Filled 2019-05-15 (×6): qty 1

## 2019-05-15 MED ORDER — ATENOLOL 25 MG PO TABS
12.5000 mg | ORAL_TABLET | Freq: Every day | ORAL | Status: DC
  Administered 2019-05-16 – 2019-05-17 (×2): 12.5 mg via ORAL
  Filled 2019-05-15 (×3): qty 1

## 2019-05-15 MED ORDER — IPRATROPIUM-ALBUTEROL 0.5-2.5 (3) MG/3ML IN SOLN
3.0000 mL | Freq: Four times a day (QID) | RESPIRATORY_TRACT | Status: DC
  Administered 2019-05-15: 3 mL via RESPIRATORY_TRACT
  Filled 2019-05-15: qty 3

## 2019-05-15 MED ORDER — ACETAMINOPHEN 325 MG PO TABS: 650.0000 mg | ORAL_TABLET | Freq: Four times a day (QID) | ORAL | Status: DC | PRN

## 2019-05-15 MED ORDER — ACETAMINOPHEN 650 MG RE SUPP: 650.0000 mg | Freq: Four times a day (QID) | RECTAL | Status: DC | PRN

## 2019-05-15 MED ORDER — VANCOMYCIN HCL IN DEXTROSE 1-5 GM/200ML-% IV SOLN
1000.0000 mg | Freq: Once | INTRAVENOUS | Status: AC
  Administered 2019-05-15: 16:00:00 1000 mg via INTRAVENOUS
  Filled 2019-05-15: qty 200

## 2019-05-15 MED ORDER — TRAZODONE HCL 50 MG PO TABS
100.0000 mg | ORAL_TABLET | Freq: Every day | ORAL | Status: DC
  Filled 2019-05-15 (×2): qty 2

## 2019-05-15 MED ORDER — CHLORHEXIDINE GLUCONATE CLOTH 2 % EX PADS
6.0000 | MEDICATED_PAD | Freq: Every day | CUTANEOUS | Status: DC
  Administered 2019-05-15: 18:00:00 6 via TOPICAL

## 2019-05-15 MED ORDER — PANTOPRAZOLE SODIUM 40 MG PO TBEC
40.0000 mg | DELAYED_RELEASE_TABLET | Freq: Two times a day (BID) | ORAL | Status: DC
  Administered 2019-05-15 – 2019-05-17 (×4): 40 mg via ORAL
  Filled 2019-05-15 (×4): qty 1

## 2019-05-15 MED ORDER — SODIUM CHLORIDE 0.9% FLUSH: 3.0000 mL | INTRAVENOUS | Status: DC | PRN

## 2019-05-15 MED ORDER — POLYETHYLENE GLYCOL 3350 17 G PO PACK: 17.0000 g | PACK | Freq: Every day | ORAL | Status: DC | PRN

## 2019-05-15 MED ORDER — ALPRAZOLAM 0.5 MG PO TABS: 0.5000 mg | ORAL_TABLET | Freq: Two times a day (BID) | ORAL | Status: DC | PRN

## 2019-05-15 MED ORDER — ADULT MULTIVITAMIN W/MINERALS CH
1.0000 | ORAL_TABLET | Freq: Every day | ORAL | Status: DC
  Administered 2019-05-16 – 2019-05-17 (×2): 1 via ORAL
  Filled 2019-05-15 (×2): qty 1

## 2019-05-15 MED ORDER — ALBUTEROL SULFATE (2.5 MG/3ML) 0.083% IN NEBU: 2.5000 mg | INHALATION_SOLUTION | RESPIRATORY_TRACT | Status: DC | PRN

## 2019-05-15 MED ORDER — SODIUM CHLORIDE 0.9 % IV SOLN: 250.0000 mL | INTRAVENOUS | Status: DC | PRN

## 2019-05-15 MED ORDER — SODIUM CHLORIDE 0.9 % IV SOLN
500.0000 mg | INTRAVENOUS | Status: DC
  Administered 2019-05-15 – 2019-05-16 (×2): 500 mg via INTRAVENOUS
  Filled 2019-05-15 (×2): qty 500

## 2019-05-15 MED ORDER — ONDANSETRON HCL 4 MG PO TABS: 4.0000 mg | ORAL_TABLET | Freq: Four times a day (QID) | ORAL | Status: DC | PRN

## 2019-05-15 MED ORDER — GUAIFENESIN ER 600 MG PO TB12
600.0000 mg | ORAL_TABLET | Freq: Two times a day (BID) | ORAL | Status: DC
  Administered 2019-05-15 – 2019-05-17 (×4): 600 mg via ORAL
  Filled 2019-05-15 (×4): qty 1

## 2019-05-15 MED ORDER — ASPIRIN EC 81 MG PO TBEC
81.0000 mg | DELAYED_RELEASE_TABLET | Freq: Every day | ORAL | Status: DC
  Administered 2019-05-16 – 2019-05-17 (×2): 81 mg via ORAL
  Filled 2019-05-15 (×2): qty 1

## 2019-05-15 MED ORDER — PIPERACILLIN-TAZOBACTAM 3.375 G IVPB
3.3750 g | Freq: Once | INTRAVENOUS | Status: AC
  Administered 2019-05-15: 13:00:00 3.375 g via INTRAVENOUS
  Filled 2019-05-15: qty 50

## 2019-05-15 MED ORDER — SODIUM CHLORIDE 0.9 % IV SOLN
INTRAVENOUS | Status: DC
  Administered 2019-05-15 – 2019-05-17 (×5): via INTRAVENOUS

## 2019-05-15 NOTE — ED Provider Notes (Signed)
[facility] EMERGENCY DEPARTMENT Provider Note   CSN: [id number] Arrival date & time: 05/15/19  1049     History   Chief Complaint Chief Complaint  Patient presents with  . Weakness    HPI Christian Ellison is a 58 y.o. male.     Patient is a 58 year old male with history of esophageal stricture/dysmotility with recurrent aspiration, multiple prior bouts of pneumonia, and polysubstance abuse.  He presents today for evaluation of shortness of breath.  Patient states he woke this morning with difficulty breathing.  He denies to me he is having any fever or cough.  He denies any ill contacts.  Patient also reports a history of sleep apnea, but denies using CPAP at present.  He is apparently discussing this possibility with his primary doctor, however this has not happened yet.  Patient was found by EMS this morning to have oxygen saturations in the 78 to 80% range.      The history is provided by the patient.  Weakness Severity:  Moderate Onset quality:  Sudden Duration:  12 hours Timing:  Constant Progression:  Worsening Chronicity:  Recurrent Relieved by:  Nothing Worsened by:  Nothing Ineffective treatments:  None tried Associated symptoms: shortness of breath   Associated symptoms: no cough and no fever     Past Medical History:  Diagnosis Date  . Anxiety   . Aspiration pneumonia ([place]) 05/26/2018   RLL  . Chronic pain   . GERD (gastroesophageal reflux disease)   . 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   Marland Kitchen Opiate abuse, continuous [facility])     Patient Active Problem List   Diagnosis Date Noted  . Multifocal pneumonia 03/11/2019  . Acute respiratory failure with hypoxemia ([place]) 03/11/2019  . Sinus tachycardia 03/11/2019  . Sepsis due to pneumonia ([place]) 03/11/2019  . Hypothyroidism 03/11/2019  . Bronchopneumonia 11/06/2018  . Acute Respiratory failure with hypoxia (2/2 Asp PNA) 11/06/2018  . Recurrent aspiration pneumonia ([place])   . Fever 06/09/2018   . Tachycardia 06/09/2018  . Hypoxia 06/09/2018  . Hypotension 06/09/2018  . Esophageal dysmotility/Esophageal stricture/stenosis/esophageal dysmotility with recurrent aspiration   . Aspiration pneumonia of right lower lobe ([place])   . Sepsis ([place]) 05/26/2018  . Pulmonary edema 02/02/2018  . HCAP (healthcare-associated pneumonia) 02/02/2018  . Weakness generalized 02/02/2018  . Hypertension 02/02/2018  . CHF (congestive heart failure) ([place]) 02/02/2018  . Nausea & vomiting 02/02/2018  . Dysphagia 02/02/2018  . Esophageal stricture/stenosis/esophageal dysmotility with recurrent aspiration 02/02/2018  . Esophageal stenosis 02/02/2018  . GERD (gastroesophageal reflux disease) 02/02/2018  . Polysubstance abuse ([place]) 02/02/2018  . Acute respiratory failure with hypoxia ([place])   . Transaminitis   . Elevated troponin   . Overdose 01/02/2018  . Hx of hepatitis C 01/17/2013  . Depression 01/17/2013  . Anxiety with depression 01/17/2013  . Right knee pain 01/17/2013    Past Surgical History:  Procedure Laterality Date  . BIOPSY  03/18/2018   Procedure: BIOPSY;  Surgeon: Rogene Houston, MD;  [facility];  Service: Endoscopy;;  gastric   . COLONOSCOPY N/A 11/22/2013   Dr. Laural Golden: Normal except for hemorrhoids.  Next colonoscopy 10 years.  . ESOPHAGEAL DILATION N/A 03/18/2018   Procedure: ESOPHAGEAL DILATION;  Surgeon: Rogene Houston, MD;  [facility];  Service: Endoscopy;  Laterality: N/A;  . ESOPHAGEAL MANOMETRY N/A 06/15/2018   Procedure: ESOPHAGEAL MANOMETRY (EM);  Surgeon: Mauri Pole, MD;  [facility];  Service: Endoscopy;  Laterality: N/A;  . ESOPHAGOGASTRODUODENOSCOPY (EGD) WITH PROPOFOL N/A 03/18/2018   Dr. Karilyn Cotaehman: Benign-appearing mild esophageal stenosis proximal to the GE J, status post dilation.  2 cm hiatal hernia.  Gastritis, biopsies negative for H. pylori  . Fracture Right Leg     Patient has rod/screws in this leg  . Rotor Cuff  2012    Right Shoulder        Home Medications    Prior to Admission medications   Medication Sig Start Date End Date Taking? Authorizing Provider  albuterol (VENTOLIN HFA) 108 (90 Base) MCG/ACT inhaler Inhale 2 puffs into the lungs every 6 (six) hours as needed for wheezing or shortness of breath (cough). 03/13/19   Shon HaleEmokpae, Courage, MD  ALPRAZolam Prudy Feeler(XANAX) 0.5 MG tablet Take 0.5 mg by mouth 2 (two) times daily.    [provider]  amoxicillin-clavulanate (AUGMENTIN) 875-125 MG tablet Take 1 tablet by mouth 2 (two) times daily. 05/05/19   Devoria AlbeKnapp, Iva, MD  aspirin EC 81 MG tablet Take 1 tablet (81 mg total) by mouth daily with breakfast. 03/13/19   Shon HaleEmokpae, Courage, MD  atenolol (TENORMIN) 25 MG tablet Take 1 tablet (25 mg total) by mouth daily. 03/13/19   Shon HaleEmokpae, Courage, MD  diltiazem (CARDIZEM) 60 MG tablet Take 1 tablet (60 mg total) by mouth 3 (three) times daily before meals. 04/29/19   Rehman, Joline MaxcyNajeeb U, MD  levothyroxine (SYNTHROID, LEVOTHROID) 25 MCG tablet Take 25 mcg by mouth daily. 05/13/18   [provider]  Multiple Vitamin (MULTIVITAMIN WITH MINERALS) TABS tablet Take 1 tablet by mouth daily. 50+    [provider]  pantoprazole (PROTONIX) 40 MG tablet Take 1 tablet (40 mg total) by mouth daily. Patient taking differently: Take 40 mg by mouth 2 (two) times daily.  03/13/19 03/12/20  Shon HaleEmokpae, Courage, MD    Family History Family History  Problem Relation Age of Onset  . Breast cancer Mother   . Colon cancer Father   . Bladder Cancer Father   . Prostate cancer Father   . Hypertension Sister   . Hypothyroidism Sister   . Healthy Sister   . Healthy Daughter   . Healthy Son   . Healthy Son     Social History Social History   Tobacco Use  . Smoking status: Never Smoker  . Smokeless tobacco: Never Used  Substance Use Topics  . Alcohol use: No    Comment: Patient states that it has been over a year  . Drug use: Yes    Types: Benzodiazepines     Comment: opiates     Allergies   Patient has no known allergies.   Review of Systems Review of Systems  Constitutional: Negative for fever.  Respiratory: Positive for shortness of breath. Negative for cough.   Neurological: Positive for weakness.  All other systems reviewed and are negative.    Physical Exam Updated Vital Signs BP (!) 180/113 (BP Location: Left Arm)   Pulse (!) 135   Temp 100.1 F (37.8 C) (Oral)   Resp (!) 24   Ht 6' (1.829 m)   Wt 88.4 kg   SpO2 90%   BMI 26.43 kg/m   Physical Exam Vitals signs and nursing note reviewed.  Constitutional:      General: He is not in acute distress.    Appearance: He is well-developed. He is not diaphoretic.  HENT:     Head: Normocephalic and atraumatic.  Neck:     Musculoskeletal: Normal range of motion and neck  supple.  Cardiovascular:     Rate and Rhythm: Regular rhythm. Tachycardia present.     Heart sounds: No murmur. No friction rub.  Pulmonary:     Effort: No respiratory distress.     Breath sounds: Rhonchi present. No wheezing or rales.  Abdominal:     General: Bowel sounds are normal. There is no distension.     Palpations: Abdomen is soft.     Tenderness: There is no abdominal tenderness.  Musculoskeletal: Normal range of motion.        General: No swelling or tenderness.     Right lower leg: No edema.     Left lower leg: No edema.  Skin:    General: Skin is warm and dry.  Neurological:     Mental Status: He is alert and oriented to person, place, and time.     Coordination: Coordination normal.      ED Treatments / Results  Labs (all labs ordered are listed, but only abnormal results are displayed) Labs Reviewed  SARS CORONAVIRUS 2 (HOSPITAL ORDER, PERFORMED IN [facility])  COMPREHENSIVE METABOLIC PANEL  BRAIN NATRIURETIC PEPTIDE  CBC WITH DIFFERENTIAL/PLATELET  LACTIC ACID, PLASMA  TROPONIN I (HIGH SENSITIVITY)    EKG EKG Interpretation  Date/Time:  Monday May 15 2019 11:01:53 EDT Ventricular Rate:  135 PR Interval:    QRS Duration: 94 QT Interval:  287 QTC Calculation: 431 R Axis:   -57 Text Interpretation:  Sinus tachycardia Aberrant complex Probable left atrial enlargement Left anterior fascicular block Confirmed by Geoffery Lyons ([id number]) on 05/15/2019 11:28:25 AM   Radiology No results found.  Procedures Procedures (including critical care time)  Medications Ordered in ED Medications - No data to display   Initial Impression / Assessment and Plan / ED Course  I have reviewed the triage vital signs and the nursing notes.  Pertinent labs & imaging results that were available during my care of the patient were reviewed by me and considered in my medical decision making (see chart for details).  Patient with history of recurrent pneumonia, felt to be related to aspiration from esophageal dysfunction.  He presents today with hypoxia and dyspnea.  He was also found to be markedly tachycardic upon presentation.  His lactate was 2.7, patient given normal saline.  Laboratory studies reveal no significant abnormality.  Chest x-ray shows what appears to be an increasing basilar opacity on the left when compared with x-ray obtained 1 week ago.  Patient remains with oxygen requirement and will require admission for treatment of recurrent pneumonia.  Vancomycin and Zosyn ordered here in the ER.  Dr. Mariea Clonts agrees to admit.  Final Clinical Impressions(s) / ED Diagnoses   Final diagnoses:  None    ED Discharge Orders    None       Geoffery Lyons, MD 05/15/19 1340

## 2019-05-15 NOTE — H&P (Signed)
Patient Demographics:    Christian Ellison, is a 58 y.o. male  MRN: [id number]   DOB - [birth year]  Admit Date - 05/15/2019  [facility]   Assessment & Plan:    Active Problems:   PNA (pneumonia)    A/p  1)Sepsis secondary tocommunity-acquired pneumonia -fevers at home, tachycardia with heart rate up to 135, tachypneic with respiratory is up to 24, hypoxic with O2 sats in the low 80s,--- continues to require oxygen supplementation  -Patient meets sepsi criteria at the time of admission -Lactic acid is 2.7 -IV fluid boluses and repeat lactic acid per protocol -Treat empirically with IV Rocephin and IV azithromycin, IV fluids bronchodilators and mucolytics --Consider switching to Unasyn for better aspiration pneumonia/anaerobic coverage if patient fails to improve with current regimen   2)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 Harlon Dittyehman--patient previously had EGD with dilatation with Dr. Karilyn Cotaehman  3)Hypothyroidism=----stable,  continue levothyroxine  4)History of hepatitis C--Complete therapy with Harvoni -Continue follow-up with gastroenterology service as an outpatient.  5)HxChronicPain/Depression/Anxiety andPolysubstance dependence-- Previously documented opioid, THC , Amphetamineand benzodiazepine dependence--- --UDS today is again positive for amphetamines and benzos patient not interested in rehab------be judicious with opiates and benzos,  6)AcuteHypoxicRespiratoryFailure----secondary to pneumonia treat as above #1--- hypoxia has resolved as noted above #1  With History of - Reviewed by me  Past  Medical History:  Diagnosis Date  . Anxiety   . Aspiration pneumonia (HCC) 05/26/2018   RLL  . Chronic pain   . GERD (gastroesophageal reflux disease)   . 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   Marland Kitchen. Opiate abuse, continuous (HCC)       Past Surgical History:  Procedure Laterality Date  . BIOPSY  03/18/2018   Procedure: BIOPSY;  Surgeon: Malissa Hippoehman, Najeeb U, MD;  [facility];  Service: Endoscopy;;  gastric   . COLONOSCOPY N/A 11/22/2013   Dr. Karilyn Cotaehman: Normal except for hemorrhoids.  Next colonoscopy 10 years.  . ESOPHAGEAL DILATION N/A 03/18/2018   Procedure: ESOPHAGEAL DILATION;  Surgeon: Malissa Hippoehman, Najeeb U, MD;  Location[facility];  Service: Endoscopy;  Laterality: N/A;  . ESOPHAGEAL MANOMETRY N/A 06/15/2018   Procedure: ESOPHAGEAL MANOMETRY (EM);  Surgeon: Napoleon FormNandigam, Kavitha V, MD;  Location: [facility];  Service: Endoscopy;  Laterality: N/A;  . ESOPHAGOGASTRODUODENOSCOPY (EGD) WITH PROPOFOL N/A 03/18/2018   Dr. Karilyn Cotaehman: Benign-appearing mild esophageal stenosis proximal to the GE J, status post dilation.  2 cm hiatal hernia.  Gastritis, biopsies negative for H. pylori  . Fracture Right Leg     Patient has rod/screws in this leg  . Rotor Cuff  2012   Right Shoulder    Chief Complaint  Patient presents with  . Weakness      HPI:    Christian Ellison  is a 58 y.o. male withpast medical history relevant  forhypothyroidism, HTN,history of hep C, h/o esophageal spasms and strictures/stenosis with history of recurrent aspiration pneumonia in the past requiring intubation and ventilation--as well as history of chronic pain and anxiety with opiate, chronic amphetamine, THC, and benzo abusepresented by EMS with yet another episode of acute hypoxic respiratory failure due to presumed  pneumonia with chest x-ray in the ED suggesting left-sided pneumonia   -Patient presented very short of breath by EMS O2 sats were 78 to 80% on room air, CBG was 149, patient had fevers  chills and weakness with tachycardia and tachypnea  -- In Ed--creatinine 0.72, BNP 49, troponin negative -WBC with a white count of 9.5 H&H is 13.9 and 42.7 with a platelet count of 191 -BAL less than 10 -Lactic acid was 2.7 and COVID-19 negative -UDS Positive for amphetamines and benzos   Review of systems:    In addition to the HPI above,   A full Review of  Systems was done, all other systems reviewed are negative except as noted above in HPI , .    Social History:  Reviewed by me    Social History   Tobacco Use  . Smoking status: Never Smoker  . Smokeless tobacco: Never Used  Substance Use Topics  . Alcohol use: No    Comment: Patient states that it has been over a year       Family History :  Reviewed by me    Family History  Problem Relation Age of Onset  . Breast cancer Mother   . Colon cancer Father   . Bladder Cancer Father   . Prostate cancer Father   . Hypertension Sister   . Hypothyroidism Sister   . Healthy Sister   . Healthy Daughter   . Healthy Son   . Healthy Son      Home Medications:   Prior to Admission medications   Medication Sig Start Date End Date Taking? Authorizing Provider  albuterol (VENTOLIN HFA) 108 (90 Base) MCG/ACT inhaler Inhale 2 puffs into the lungs every 6 (six) hours as needed for wheezing or shortness of breath (cough). 03/13/19  Yes Malahki Gasaway, MD  ALPRAZolam Duanne Moron) 0.5 MG tablet Take 0.5 mg by mouth 2 (two) times daily.   Yes [provider]  amoxicillin-clavulanate (AUGMENTIN) 875-125 MG tablet Take 1 tablet by mouth 2 (two) times daily. 05/05/19  Yes Rolland Porter, MD  aspirin EC 81 MG tablet Take 1 tablet (81 mg total) by mouth daily with breakfast. 03/13/19  Yes Haelyn Forgey, MD  diltiazem (CARDIZEM) 60 MG tablet Take 1 tablet (60 mg total) by mouth 3 (three) times daily before meals. 04/29/19  Yes Rehman, Mechele Dawley, MD  levothyroxine (SYNTHROID, LEVOTHROID) 25 MCG tablet Take 25 mcg by mouth daily.  05/13/18  Yes [provider]  Multiple Vitamin (MULTIVITAMIN WITH MINERALS) TABS tablet Take 1 tablet by mouth daily. 50+   Yes [provider]  pantoprazole (PROTONIX) 40 MG tablet Take 1 tablet (40 mg total) by mouth daily. Patient taking differently: Take 40 mg by mouth 2 (two) times daily.  03/13/19 03/12/20 Yes Leandrew Keech, MD  atenolol (TENORMIN) 25 MG tablet Take 1 tablet (25 mg total) by mouth daily. Patient not taking: Reported on 05/15/2019 03/13/19   Roxan Hockey, MD     Allergies:    No Known Allergies   Physical Exam:   Vitals  Blood pressure 111/77, pulse 91, temperature 98.3 F (36.8 C), temperature source Oral, resp. rate 18, height 6' (1.829 m), weight  88.4 kg, SpO2 93 %.  Physical Examination: General appearance - alert, well appearing, and in no distress Nose- Ferguson at 2 L/min Mental status - alert, oriented to person, place, and time, Eyes - sclera anicteric Neck - supple, no JVD elevation , Chest -diminished in bases, few scattered rhonchi Heart - S1 and S2 normal, regular  Abdomen - soft, nontender, nondistended, no masses or organomegaly Neurological - screening mental status exam normal, neck supple without rigidity, cranial nerves II through XII intact, DTR's normal and symmetric Extremities - no pedal edema noted, intact peripheral pulses  Skin - warm, dry    Data Review:    CBC Recent Labs  Lab 05/15/19 1143  WBC 9.5  HGB 13.9  HCT 42.7  PLT 191  MCV 91.4  MCH 29.8  MCHC 32.6  RDW 12.6  LYMPHSABS 0.8  MONOABS 0.4  EOSABS 0.1  BASOSABS 0.0   ------------------------------------------------------------------------------------------------------------------  Chemistries  Recent Labs  Lab 05/15/19 1143  NA 138  K 4.2  CL 100  CO2 26  GLUCOSE 124*  BUN 10  CREATININE 0.72  CALCIUM 9.0  AST 28  ALT 15  ALKPHOS 53  BILITOT 0.8    ------------------------------------------------------------------------------------------------------------------ estimated creatinine clearance is 111.8 mL/min (by C-G formula based on SCr of 0.72 mg/dL). ------------------------------------------------------------------------------------------------------------------ No results for input(s): TSH, T4TOTAL, T3FREE, THYROIDAB in the last 72 hours.  Invalid input(s): FREET3   Coagulation profile No results for input(s): INR, PROTIME in the last 168 hours. ------------------------------------------------------------------------------------------------------------------- No results for input(s): DDIMER in the last 72 hours. -------------------------------------------------------------------------------------------------------------------  Cardiac Enzymes No results for input(s): CKMB, TROPONINI, MYOGLOBIN in the last 168 hours.  Invalid input(s): CK ------------------------------------------------------------------------------------------------------------------    Component Value Date/Time   BNP 49.0 05/15/2019 1143   --------------------------------------------------------------------------------------------------------------  Urinalysis    Component Value Date/Time   COLORURINE STRAW (A) 05/15/2019 1219   APPEARANCEUR CLEAR 05/15/2019 1219   LABSPEC 1.009 05/15/2019 1219   PHURINE 5.0 05/15/2019 1219   GLUCOSEU NEGATIVE 05/15/2019 1219   HGBUR SMALL (A) 05/15/2019 1219   BILIRUBINUR NEGATIVE 05/15/2019 1219   KETONESUR NEGATIVE 05/15/2019 1219   PROTEINUR NEGATIVE 05/15/2019 1219   NITRITE NEGATIVE 05/15/2019 1219   LEUKOCYTESUR NEGATIVE 05/15/2019 1219    ----------------------------------------------------------------------------------------------------------------   Imaging Results:    Dg Chest Port 1 View  Result Date: 05/15/2019 CLINICAL DATA:  Shortness of breath, weakness EXAM: PORTABLE CHEST 1 VIEW COMPARISON:   05/04/2019 FINDINGS: Cardiac shadow is stable. The lungs are well aerated bilaterally. Slight increased basilar opacity on the left is noted likely representing some mild atelectasis or early infiltrate. No other focal abnormality is seen. IMPRESSION: Increasing basilar opacity on the left. Electronically Signed   By: Alcide Clever M.D.   On: 05/15/2019 12:19   Radiological Exams on Admission: Dg Chest Port 1 View  Result Date: 05/15/2019 CLINICAL DATA:  Shortness of breath, weakness EXAM: PORTABLE CHEST 1 VIEW COMPARISON:  05/04/2019 FINDINGS: Cardiac shadow is stable. The lungs are well aerated bilaterally. Slight increased basilar opacity on the left is noted likely representing some mild atelectasis or early infiltrate. No other focal abnormality is seen. IMPRESSION: Increasing basilar opacity on the left. Electronically Signed   By: Alcide Clever M.D.   On: 05/15/2019 12:19    DVT Prophylaxis -SCD/heparin AM Labs Ordered, also please review Full Orders  Family Communication: Admission, patients condition and plan of care including tests being ordered have been discussed with the patient  who indicate understanding and agree with the plan   Code Status -  Full Code  Likely DC to  home  Condition   stable*  Shon Hale M.D on 05/15/2019 at 7:17 PM Go to [email or web address] -  for contact info  [facility] - Office  [phone]

## 2019-05-15 NOTE — ED Notes (Signed)
Date and time results received: 05/15/19 1212 (use smartphrase ".now" to insert current time)  Test: lactic acid Critical Value: 2.7  Name of Provider Notified: Dr. Stark Jock  Orders Received? Or Actions Taken?: new orders

## 2019-05-15 NOTE — ED Triage Notes (Signed)
Patient presents the ED via [facility] due to increased weakness, "chills"  Increased heart rate and decreased oxygen saturation. Patient has been recently diagnosed with bilateral pneumonia. Per EMS patient 78-80% room air, 02 applied 92% on 4L [employer].  CBG 149.

## 2019-05-16 LAB — BASIC METABOLIC PANEL
Anion gap: 11 (ref 5–15)
BUN: 10 mg/dL (ref 6–20)
CO2: 27 mmol/L (ref 22–32)
Calcium: 8.4 mg/dL — ABNORMAL LOW (ref 8.9–10.3)
Chloride: 105 mmol/L (ref 98–111)
Creatinine, Ser: 0.63 mg/dL (ref 0.61–1.24)
GFR calc Af Amer: >60 mL/min (ref >60)
GFR calc non Af Amer: >60 mL/min (ref >60)
Glucose, Bld: 97 mg/dL (ref 70–99)
Potassium: 3.7 mmol/L (ref 3.5–5.1)
Sodium: 143 mmol/L (ref 135–145)

## 2019-05-16 LAB — CBC
HCT: 36.9 % — ABNORMAL LOW (ref 39.0–52.0)
Hemoglobin: 11.2 g/dL — ABNORMAL LOW (ref 13.0–17.0)
MCH: 29.7 pg (ref 26.0–34.0)
MCHC: 30.4 g/dL (ref 30.0–36.0)
MCV: 97.9 fL (ref 80.0–100.0)
Platelets: 172 10*3/uL (ref 150–400)
RBC: 3.77 MIL/uL — ABNORMAL LOW (ref 4.22–5.81)
RDW: 12.6 % (ref 11.5–15.5)
WBC: 7.7 10*3/uL (ref 4.0–10.5)
nRBC: 0 % (ref 0.0–0.2)

## 2019-05-16 LAB — MRSA PCR SCREENING: MRSA by PCR: NEGATIVE

## 2019-05-16 MED ORDER — IPRATROPIUM-ALBUTEROL 0.5-2.5 (3) MG/3ML IN SOLN
3.0000 mL | Freq: Three times a day (TID) | RESPIRATORY_TRACT | Status: DC
  Administered 2019-05-16 – 2019-05-17 (×5): 3 mL via RESPIRATORY_TRACT
  Filled 2019-05-16 (×5): qty 3

## 2019-05-16 NOTE — Progress Notes (Signed)
Patient Demographics:    Christian Ellison, is a 58 y.o. male, DOB - 07-Apr-1961, YOV:[id number]  Admit date - 05/15/2019   Admitting Physician Sulaiman Imbert Denton Brick, MD  Outpatient Primary MD for the patient is [facility]  LOS - 1   Chief Complaint  Patient presents with  . Weakness        Subjective:    Christian Ellison today has no fevers, has nausea, but no emesis,  No chest pain,  --some cough and shortness of breath persist  Assessment  & Plan :    Active Problems:   PNA (pneumonia)   1)Sepsis secondary tocommunity-acquired pneumonia -patient met sepsis criteria on admission, -Sepsis pathophysiology appears to be resolving, -Hypoxia is improving--O2 sats occasionally down to 86 to 88% -No further fevers  -Continue oxygen supplementation  -Continue IV fluids, IV Rocephin and IV azithromycin, IV fluids bronchodilators and mucolytics -MRSA PCR negative --Consider switching to Unasyn for better aspiration pneumonia/anaerobic coverage if patient fails to improve with current regimen   2)H/o Esophagealdysmotility/stricturesstatus post dilations with recurrent aspiration---- -patient usually follows up with [place] outpatient forGI consult with Dr. Devonne Doughty previously had EGD with dilatation with Dr. Laural Golden -Discussed with Dr. Laural Golden--- he plans to see patient soon as outpatient  3)Hypothyroidism=----stable,  continue levothyroxine  4)History of hepatitis C--Complete therapy with Harvoni -Continue follow-up with gastroenterology service as an outpatient.  5)HxChronicPain/Depression/Anxiety andPolysubstance dependence-- Previously documented opioid, THC , Amphetamineand benzodiazepine dependence--- --UDS today is again positive for amphetamines and benzos patient not interested in rehab------be judicious with opiates and benzos,   6)AcuteHypoxicRespiratoryFailure----secondary to pneumonia treat as above #1---hypoxia appears to be resolving  7)OSA--patient already has sleep study as outpatient okay to use CPAP nightly while here  Disposition/Need for in-Hospital Stay- patient unable to be discharged at this time due to -Hypoxia is improving--O2 sats occasionally down to 86 to 88%--recurrent pneumonia, clinically improving, but not stable enough for discharge at this time  Code Status : Full  Family Communication:   (patient is alert, awake and coherent) -Discussed with his significant other at bedside  Disposition Plan  : Possible dc in am   Consults  :  D/w Dr Laural Golden  DVT Prophylaxis  :   - Heparin - SCDs    Lab Results  Component Value Date   PLT 172 05/16/2019    Inpatient Medications  Scheduled Meds: . aspirin EC  81 mg Oral Q breakfast  . atenolol  12.5 mg Oral Daily  . Chlorhexidine Gluconate Cloth  6 each Topical Daily  . diltiazem  120 mg Oral Daily  . guaiFENesin  600 mg Oral BID  . heparin  5,000 Units Subcutaneous Q8H  . ipratropium-albuterol  3 mL Nebulization TID  . levothyroxine  25 mcg Oral Q0600  . multivitamin with minerals  1 tablet Oral Daily  . pantoprazole  40 mg Oral BID  . traZODone  100 mg Oral QHS   Continuous Infusions: . sodium chloride    . sodium chloride 150 mL/hr at 05/16/19 0517  . azithromycin 500 mg (05/15/19 2104)  . cefTRIAXone (ROCEPHIN)  IV 1 g (05/15/19 2024)   PRN Meds:.sodium chloride, acetaminophen **OR** acetaminophen, albuterol, ALPRAZolam, ondansetron **OR** ondansetron (ZOFRAN) IV, polyethylene glycol, sodium chloride flush    Anti-infectives (  From admission, onward)   Start     Dose/Rate Route Frequency Ordered Stop   05/15/19 2200  azithromycin (ZITHROMAX) 500 mg in sodium chloride 0.9 % 250 mL IVPB     500 mg 250 mL/hr over 60 Minutes Intravenous Every 24 hours 05/15/19 1916     05/15/19 2000  cefTRIAXone (ROCEPHIN) 1 g in sodium chloride  0.9 % 100 mL IVPB     1 g 200 mL/hr over 30 Minutes Intravenous Every 24 hours 05/15/19 1916     05/15/19 1315  vancomycin (VANCOCIN) IVPB 1000 mg/200 mL premix     1,000 mg 200 mL/hr over 60 Minutes Intravenous  Once 05/15/19 1305 05/15/19 1714   05/15/19 1315  piperacillin-tazobactam (ZOSYN) IVPB 3.375 g     3.375 g 12.5 mL/hr over 240 Minutes Intravenous  Once 05/15/19 1305 05/15/19 1720        Objective:   Vitals:   05/16/19 0743 05/16/19 0751 05/16/19 0803 05/16/19 0813  BP:    105/60  Pulse:    66  Resp:    14  Temp: (!) 97.5 F (36.4 C)     TempSrc: Oral     SpO2:  (!) 89% 91% 98%  Weight:      Height:        Temp:  [97.5 F (36.4 C)-100.1 F (37.8 C)] 97.5 F (36.4 C) (09/29 0743) Pulse Rate:  [63-135] 66 (09/29 0813) Resp:  [14-24] 14 (09/29 0813) BP: (91-180)/(51-113) 105/60 (09/29 0813) SpO2:  [86 %-98 %] 98 % (09/29 0813) Weight:  [88.4 kg] 88.4 kg (09/28 1056)   Wt Readings from Last 3 Encounters:  05/15/19 88.4 kg  05/04/19 88.5 kg  04/26/19 83.6 kg     Intake/Output Summary (Last 24 hours) at 05/16/2019 0848 Last data filed at 05/15/2019 1809 Gross per 24 hour  Intake 1227.92 ml  Output -  Net 1227.92 ml   Physical Exam  Gen:- Awake Alert, able to speak in sentences HEENT:- [place].AT, No sclera icterus Neck-Supple Neck,No JVD,.  Lungs-improving air movement bilaterally, no wheezing CV- S1, S2 normal, regular  Abd-  +ve B.Sounds, Abd Soft, No tenderness,    Extremity/Skin:- No  edema, pedal pulses present  Psych-affect is appropriate, oriented x3 Neuro-no new focal deficits, no tremors   Data Review:   Micro Results Recent Results (from the past 240 hour(s))  SARS Coronavirus 2 [facility] order, Performed in [facility] hospital lab) Nasopharyngeal Nasopharyngeal Swab     Status: None   Collection Time: 05/15/19 11:46 AM   Specimen: Nasopharyngeal Swab  Result Value Ref Range Status   SARS Coronavirus 2 NEGATIVE NEGATIVE Final    Comment:  (NOTE) If result is NEGATIVE SARS-CoV-2 target nucleic acids are NOT DETECTED. The SARS-CoV-2 RNA is generally detectable in upper and lower  respiratory specimens during the acute phase of infection. The lowest  concentration of SARS-CoV-2 viral copies this assay can detect is 250  copies / mL. A negative result does not preclude SARS-CoV-2 infection  and should not be used as the sole basis for treatment or other  patient management decisions.  A negative result may occur with  improper specimen collection / handling, submission of specimen other  than nasopharyngeal swab, presence of viral mutation(s) within the  areas targeted by this assay, and inadequate number of viral copies  (<250 copies / mL). A negative result must be combined with clinical  observations, patient history, and epidemiological information. If result is POSITIVE SARS-CoV-2 target nucleic acids are  DETECTED. The SARS-CoV-2 RNA is generally detectable in upper and lower  respiratory specimens dur ing the acute phase of infection.  Positive  results are indicative of active infection with SARS-CoV-2.  Clinical  correlation with patient history and other diagnostic information is  necessary to determine patient infection status.  Positive results do  not rule out bacterial infection or co-infection with other viruses. If result is PRESUMPTIVE POSTIVE SARS-CoV-2 nucleic acids MAY BE PRESENT.   A presumptive positive result was obtained on the submitted specimen  and confirmed on repeat testing.  While 2019 novel coronavirus  (SARS-CoV-2) nucleic acids may be present in the submitted sample  additional confirmatory testing may be necessary for epidemiological  and / or clinical management purposes  to differentiate between  SARS-CoV-2 and other Sarbecovirus currently known to infect humans.  If clinically indicated additional testing with an alternate test  methodology [phone]) is advised. The SARS-CoV-2 RNA is  generally  detectable in upper and lower respiratory sp ecimens during the acute  phase of infection. The expected result is Negative. Fact Sheet for Patients:  StrictlyIdeas.no Fact Sheet for Healthcare Providers: BankingDealers.co.za This test is not yet approved or cleared by the [place] FDA and has been authorized for detection and/or diagnosis of SARS-CoV-2 by FDA under an Emergency Use Authorization (EUA).  This EUA will remain in effect (meaning this test can be used) for the duration of the COVID-19 declaration under Section 564(b)(1) of the Act, 21 U.S.C. section 360bbb-3(b)(1), unless the authorization is terminated or revoked sooner. Performed at [facility], [address], [place], [place] [address]   MRSA PCR Screening     Status: None   Collection Time: 05/16/19  4:02 AM  Result Value Ref Range Status   MRSA by PCR NEGATIVE NEGATIVE Final    Comment:        The GeneXpert MRSA Assay (FDA approved for NASAL specimens only), is one component of a comprehensive MRSA colonization surveillance program. It is not intended to diagnose MRSA infection nor to guide or monitor treatment for MRSA infections. Performed at [facility], [address], [place], [place] [address]     Radiology Reports Dg Chest [place] 1 View  Result Date: 05/15/2019 CLINICAL DATA:  Shortness of breath, weakness EXAM: PORTABLE CHEST 1 VIEW COMPARISON:  05/04/2019 FINDINGS: Cardiac shadow is stable. The lungs are well aerated bilaterally. Slight increased basilar opacity on the left is noted likely representing some mild atelectasis or early infiltrate. No other focal abnormality is seen. IMPRESSION: Increasing basilar opacity on the left. Electronically Signed   By: Inez Catalina M.D.   On: 05/15/2019 12:19   Dg Chest Portable 1 View  Result Date: 05/04/2019 CLINICAL DATA:  Short of breath EXAM: PORTABLE CHEST 1 VIEW COMPARISON:  02/13/2019,  03/11/2019 FINDINGS: The heart size and mediastinal contours are within normal limits. Both lungs are clear. The visualized skeletal structures are unremarkable. IMPRESSION: No active disease. Electronically Signed   By: Donavan Foil M.D.   On: 05/04/2019 22:44     CBC Recent Labs  Lab 05/15/19 1143 05/16/19 0407  WBC 9.5 7.7  HGB 13.9 11.2*  HCT 42.7 36.9*  PLT 191 172  MCV 91.4 97.9  MCH 29.8 29.7  MCHC 32.6 30.4  RDW 12.6 12.6  LYMPHSABS 0.8  --   MONOABS 0.4  --   EOSABS 0.1  --   BASOSABS 0.0  --     Chemistries  Recent Labs  Lab 05/15/19 1143 05/16/19 0407  NA 138 143  K 4.2 3.7  CL 100 105  CO2 26 27  GLUCOSE 124* 97  BUN 10 10  CREATININE 0.72 0.63  CALCIUM 9.0 8.4*  AST 28  --   ALT 15  --   ALKPHOS 53  --   BILITOT 0.8  --    ------------------------------------------------------------------------------------------------------------------ No results for input(s): CHOL, HDL, LDLCALC, TRIG, CHOLHDL, LDLDIRECT in the last 72 hours.  No results found for: HGBA1C ------------------------------------------------------------------------------------------------------------------ No results for input(s): TSH, T4TOTAL, T3FREE, THYROIDAB in the last 72 hours.  Invalid input(s): FREET3 ------------------------------------------------------------------------------------------------------------------ No results for input(s): VITAMINB12, FOLATE, FERRITIN, TIBC, IRON, RETICCTPCT in the last 72 hours.  Coagulation profile No results for input(s): INR, PROTIME in the last 168 hours.  No results for input(s): DDIMER in the last 72 hours.  Cardiac Enzymes No results for input(s): CKMB, TROPONINI, MYOGLOBIN in the last 168 hours.  Invalid input(s): CK ------------------------------------------------------------------------------------------------------------------    Component Value Date/Time   BNP 49.0 05/15/2019 [place] M.D on 05/16/2019 at  8:48 AM  Go to [email or web address] - for contact info  [facility] - Office  [phone]

## 2019-05-17 DIAGNOSIS — R1310 Dysphagia, unspecified: Secondary | ICD-10-CM

## 2019-05-17 DIAGNOSIS — G4733 Obstructive sleep apnea (adult) (pediatric): Secondary | ICD-10-CM

## 2019-05-17 MED ORDER — ACETAMINOPHEN 325 MG PO TABS: 650.0000 mg | ORAL_TABLET | Freq: Four times a day (QID) | ORAL | 1 refills | Status: AC | PRN

## 2019-05-17 MED ORDER — AMOXICILLIN-POT CLAVULANATE 875-125 MG PO TABS: 1.0000 | ORAL_TABLET | Freq: Two times a day (BID) | ORAL | 0 refills | Status: AC

## 2019-05-17 MED ORDER — PANTOPRAZOLE SODIUM 40 MG PO TBEC: 40.0000 mg | DELAYED_RELEASE_TABLET | Freq: Two times a day (BID) | ORAL | 2 refills | Status: DC

## 2019-05-17 MED ORDER — ALBUTEROL SULFATE HFA 108 (90 BASE) MCG/ACT IN AERS: 2.0000 | INHALATION_SPRAY | Freq: Four times a day (QID) | RESPIRATORY_TRACT | 1 refills | Status: DC | PRN

## 2019-05-17 MED ORDER — DILTIAZEM HCL ER COATED BEADS 120 MG PO CP24: 120.0000 mg | ORAL_CAPSULE | Freq: Every day | ORAL | 5 refills | Status: DC

## 2019-05-17 MED ORDER — GUAIFENESIN ER 600 MG PO TB12: 600.0000 mg | ORAL_TABLET | Freq: Two times a day (BID) | ORAL | 0 refills | Status: AC

## 2019-05-17 NOTE — Plan of Care (Signed)

## 2019-05-17 NOTE — Discharge Summary (Signed)
Christian Ellison, is a 58 y.o. male  DOB [birth year]  MRN [id number].  Admission date:  05/15/2019  Admitting Physician  Roxan Hockey, MD  Discharge Date:  05/17/2019   Primary MD  [facility]  Recommendations for primary care physician for things to follow:   1)Please take medications as prescribed- 2)Avoid amphetamines 3)Please follow-up with Dr. Laural Golden  Admission Diagnosis  HCAP (healthcare-associated pneumonia) [J18.9]   Discharge Diagnosis  HCAP (healthcare-associated pneumonia) [J18.9]    Active Problems:   PNA (pneumonia)     Past Medical History:  Diagnosis Date  . Anxiety   . Aspiration pneumonia ([place]) 05/26/2018   RLL  . Chronic pain   . GERD (gastroesophageal reflux disease)   . 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   Marland Kitchen Opiate abuse, continuous ([place])     Past Surgical History:  Procedure Laterality Date  . BIOPSY  03/18/2018   Procedure: BIOPSY;  Surgeon: Rogene Houston, MD;  [facility];  Service: Endoscopy;;  gastric   . COLONOSCOPY N/A 11/22/2013   Dr. Laural Golden: Normal except for hemorrhoids.  Next colonoscopy 10 years.  . ESOPHAGEAL DILATION N/A 03/18/2018   Procedure: ESOPHAGEAL DILATION;  Surgeon: Rogene Houston, MD;  [facility];  Service: Endoscopy;  Laterality: N/A;  . ESOPHAGEAL MANOMETRY N/A 06/15/2018   Procedure: ESOPHAGEAL MANOMETRY (EM);  Surgeon: Mauri Pole, MD;  Location: [facility];  Service: Endoscopy;  Laterality: N/A;  . ESOPHAGOGASTRODUODENOSCOPY (EGD) WITH PROPOFOL N/A 03/18/2018   Dr. Laural Golden: Benign-appearing mild esophageal stenosis proximal to the GE J, status post dilation.  2 cm hiatal hernia.  Gastritis, biopsies negative for H. pylori  . Fracture Right Leg     Patient has rod/screws in this leg  . Rotor Cuff  2012   Right Shoulder       HPI  from the history and physical  done on the day of admission:     Christian Ellison  is a 58 y.o. male withpast medical history relevant forhypothyroidism, HTN,history of hep C, h/o esophageal spasms and strictures/stenosis with history of recurrent aspiration pneumonia in the past requiring intubation and ventilation--as well as history of chronic pain and anxiety with opiate, chronic amphetamine, THC, and benzo abusepresented by EMS with yet another episode of acute hypoxic respiratory failure due to presumed  pneumonia with chest x-ray in the ED suggesting left-sided pneumonia   -Patient presented very short of breath by EMS O2 sats were 78 to 80% on room air, CBG was 149, patient had fevers chills and weakness with tachycardia and tachypnea  -- In Ed--creatinine 0.72, BNP 49, troponin negative -WBC with a white count of 9.5 H&H is 13.9 and 42.7 with a platelet count of 191 -BAL less than 10 -Lactic acid was 2.7 and COVID-19 negative -UDS Positive for amphetamines and benzos    Hospital Course:    - 1)Sepsis secondary tocommunity-acquired pneumonia-patient met sepsis criteria on admission, -Sepsis pathophysiology has resolved -Hypoxia has resolved O2 sats  on room air 98% O2 sats post ambulation on room air 96% -No further fevers  -Improved with IV fluids, also treated with IV Rocephin and IV azithromycin, as well as bronchodilators and mucolytics -MRSA PCR negative -Okay to discharge on p.o. Augmentin due to concerns about possible aspiration component   2)H/o Esophagealdysmotility/stricturesstatus post dilations with recurrent aspiration---- -patient usually follows up with usoutpatient forGI consult with Dr. Devonne Doughty previously had EGD with dilatation with Dr. Laural Golden -Discussed with Dr. Raechel Ache follow-up advised   3)Hypothyroidism=----stable, continue levothyroxine  4)History of hepatitis C--Complete therapy with [facility] -Continue follow-up with gastroenterology service  as an outpatient.  5)HxChronicPain/Depression/Anxiety andPolysubstance dependence-- Previously documented opioid, THC , Amphetamineand benzodiazepine dependence--- --UDS this admission is again positive for amphetamines and benzos patient not interested in rehab------  6)AcuteHypoxicRespiratoryFailure----secondary to pneumonia treat as above #1---hypoxia  has resolved, see #1 above  7)OSA--patient already has sleep study as outpatient --patient will pick up CPAP machine as outpatient   Code Status : Full  Family Communication:   (patient is alert, awake and coherent) -Discussed with his significant other at bedside  Disposition Plan  : home  Consults  :  D/w Dr Laural Golden  Discharge Condition: stable  Follow UP--Dr Laural Golden as outpatient  Diet and Activity recommendation:  As advised  Discharge Instructions    Discharge Instructions    Call MD for:  difficulty breathing, headache or visual disturbances   Complete by: As directed    Call MD for:  persistant dizziness or light-headedness   Complete by: As directed    Call MD for:  persistant nausea and vomiting   Complete by: As directed    Call MD for:  severe uncontrolled pain   Complete by: As directed    Call MD for:  temperature >100.4   Complete by: As directed    Diet - low sodium heart healthy   Complete by: As directed    Discharge instructions   Complete by: As directed    1) please take medications as prescribed- 2) avoid amphetamines 3) please follow-up with Dr. Laural Golden   Increase activity slowly   Complete by: As directed         Discharge Medications     Allergies as of 05/17/2019   No Known Allergies     Medication List    STOP taking these medications   atenolol 25 MG tablet Commonly known as: TENORMIN   diltiazem 60 MG tablet Commonly known as: Cardizem     TAKE these medications   acetaminophen 325 MG tablet Commonly known as: TYLENOL Take 2 tablets (650 mg total) by  mouth every 6 (six) hours as needed for mild pain (or Fever >/= 101).   albuterol 108 (90 Base) MCG/ACT inhaler Commonly known as: VENTOLIN HFA Inhale 2 puffs into the lungs every 6 (six) hours as needed for wheezing or shortness of breath (cough).   ALPRAZolam 0.5 MG tablet Commonly known as: XANAX Take 0.5 mg by mouth 2 (two) times daily.   amoxicillin-clavulanate 875-125 MG tablet Commonly known as: AUGMENTIN Take 1 tablet by mouth 2 (two) times daily for 5 days.   aspirin EC 81 MG tablet Take 1 tablet (81 mg total) by mouth daily with breakfast.   diltiazem 120 MG 24 hr capsule Commonly known as: Cardizem CD Take 1 capsule (120 mg total) by mouth daily.   guaiFENesin 600 MG 12 hr tablet Commonly known as: MUCINEX Take 1 tablet (600 mg total) by mouth 2 (two) times daily for 10  days.   levothyroxine 25 MCG tablet Commonly known as: SYNTHROID Take 25 mcg by mouth daily.   multivitamin with minerals Tabs tablet Take 1 tablet by mouth daily. 50+   pantoprazole 40 MG tablet Commonly known as: Protonix Take 1 tablet (40 mg total) by mouth 2 (two) times daily.      Major procedures and Radiology Reports - PLEASE review detailed and final reports for all details, in brief -   Dg Chest Port 1 View  Result Date: 05/15/2019 CLINICAL DATA:  Shortness of breath, weakness EXAM: PORTABLE CHEST 1 VIEW COMPARISON:  05/04/2019 FINDINGS: Cardiac shadow is stable. The lungs are well aerated bilaterally. Slight increased basilar opacity on the left is noted likely representing some mild atelectasis or early infiltrate. No other focal abnormality is seen. IMPRESSION: Increasing basilar opacity on the left. Electronically Signed   By: Inez Catalina M.D.   On: 05/15/2019 12:19   Dg Chest Portable 1 View  Result Date: 05/04/2019 CLINICAL DATA:  Short of breath EXAM: PORTABLE CHEST 1 VIEW COMPARISON:  02/13/2019, 03/11/2019 FINDINGS: The heart size and mediastinal contours are within normal  limits. Both lungs are clear. The visualized skeletal structures are unremarkable. IMPRESSION: No active disease. Electronically Signed   By: Donavan Foil M.D.   On: 05/04/2019 22:44   Micro Results   Recent Results (from the past 240 hour(s))  SARS Coronavirus 2 [facility] order, Performed in [facility] hospital lab) Nasopharyngeal Nasopharyngeal Swab     Status: None   Collection Time: 05/15/19 11:46 AM   Specimen: Nasopharyngeal Swab  Result Value Ref Range Status   SARS Coronavirus 2 NEGATIVE NEGATIVE Final    Comment: (NOTE) If result is NEGATIVE SARS-CoV-2 target nucleic acids are NOT DETECTED. The SARS-CoV-2 RNA is generally detectable in upper and lower  respiratory specimens during the acute phase of infection. The lowest  concentration of SARS-CoV-2 viral copies this assay can detect is 250  copies / mL. A negative result does not preclude SARS-CoV-2 infection  and should not be used as the sole basis for treatment or other  patient management decisions.  A negative result may occur with  improper specimen collection / handling, submission of specimen other  than nasopharyngeal swab, presence of viral mutation(s) within the  areas targeted by this assay, and inadequate number of viral copies  (<250 copies / mL). A negative result must be combined with clinical  observations, patient history, and epidemiological information. If result is POSITIVE SARS-CoV-2 target nucleic acids are DETECTED. The SARS-CoV-2 RNA is generally detectable in upper and lower  respiratory specimens dur ing the acute phase of infection.  Positive  results are indicative of active infection with SARS-CoV-2.  Clinical  correlation with patient history and other diagnostic information is  necessary to determine patient infection status.  Positive results do  not rule out bacterial infection or co-infection with other viruses. If result is PRESUMPTIVE POSTIVE SARS-CoV-2 nucleic acids MAY BE PRESENT.    A presumptive positive result was obtained on the submitted specimen  and confirmed on repeat testing.  While 2019 novel coronavirus  (SARS-CoV-2) nucleic acids may be present in the submitted sample  additional confirmatory testing may be necessary for epidemiological  and / or clinical management purposes  to differentiate between  SARS-CoV-2 and other Sarbecovirus currently known to infect humans.  If clinically indicated additional testing with an alternate test  methodology [phone]) is advised. The SARS-CoV-2 RNA is generally  detectable in upper and lower respiratory sp  ecimens during the acute  phase of infection. The expected result is Negative. Fact Sheet for Patients:  StrictlyIdeas.no Fact Sheet for Healthcare Providers: BankingDealers.co.za This test is not yet approved or cleared by the [place] FDA and has been authorized for detection and/or diagnosis of SARS-CoV-2 by FDA under an Emergency Use Authorization (EUA).  This EUA will remain in effect (meaning this test can be used) for the duration of the COVID-19 declaration under Section 564(b)(1) of the Act, 21 U.S.C. section 360bbb-3(b)(1), unless the authorization is terminated or revoked sooner. Performed at [facility], [address] [place], [place] [address]   MRSA PCR Screening     Status: None   Collection Time: 05/16/19  4:02 AM  Result Value Ref Range Status   MRSA by PCR NEGATIVE NEGATIVE Final    Comment:        The GeneXpert MRSA Assay (FDA approved for NASAL specimens only), is one component of a comprehensive MRSA colonization surveillance program. It is not intended to diagnose MRSA infection nor to guide or monitor treatment for MRSA infections. Performed at [facility], [address] [place], [place] [address]        Today   Subjective    Jenny Reichmann [place] today has no new           Patient has been seen and examined prior to  discharge   Objective   Blood pressure 115/69, pulse 60, temperature (!) 97.4 F (36.3 C), temperature source Oral, resp. rate 18, height 6' (1.829 m), weight 88.4 kg, SpO2 93 %.   Intake/Output Summary (Last 24 hours) at 05/17/2019 1423 Last data filed at 05/17/2019 1400 Gross per 24 hour  Intake 4893.67 ml  Output -  Net 4893.67 ml    Exam Gen:- Awake Alert, no acute distress  HEENT:- [place].AT, No sclera icterus Neck-Supple Neck,No JVD,.  Lungs-  CTAB , good air movement bilaterally  CV- S1, S2 normal, regular Abd-  +ve B.Sounds, Abd Soft, No tenderness,    Extremity/Skin:- No  edema,   good pulses Psych-affect is appropriate, oriented x3 Neuro-no new focal deficits, no tremors    Data Review   CBC w Diff:  Lab Results  Component Value Date   WBC 7.7 05/16/2019   HGB 11.2 (L) 05/16/2019   HCT 36.9 (L) 05/16/2019   PLT 172 05/16/2019   LYMPHOPCT 9 05/15/2019   MONOPCT 4 05/15/2019   EOSPCT 1 05/15/2019   BASOPCT 0 05/15/2019    CMP:  Lab Results  Component Value Date   NA 143 05/16/2019   K 3.7 05/16/2019   CL 105 05/16/2019   CO2 27 05/16/2019   BUN 10 05/16/2019   CREATININE 0.63 05/16/2019   PROT 7.3 05/15/2019   ALBUMIN 4.2 05/15/2019   BILITOT 0.8 05/15/2019   ALKPHOS 53 05/15/2019   AST 28 05/15/2019   ALT 15 05/15/2019  .   Total Discharge time is about 33 minutes  Roxan Hockey M.D on 05/17/2019 at 2:23 PM  Go to [email or web address] -  for contact info  [facility] - Office  [phone]

## 2019-05-17 NOTE — Progress Notes (Signed)
O2 Sat at rest on RA 97%, after walking and on RA 96%. No c/o SOB. Dr. Denton Brick notified.

## 2019-05-17 NOTE — TOC Transition Note (Signed)
Transition of Care [facility]) - CM/SW Discharge Note   Patient Details  Name: Christian Ellison MRN: [id number] Date of Birth: 10/31/1960  [facility] CM/SW Contact:  Amerigo Mcglory, Chauncey [place], RN Phone Number: 05/17/2019, 11:36 AM   Clinical Narrative:   Patient has no Insurance, Pcp listed as [facility].  Per the office patient has not been seen in one year. Appointment scheduled with [facility] last admission in July. Placed a call to them to see if patient showed up and try to schedule follow up appt.   Patient declines any home health/intergrated services.   Will be assessed for oxygen need prior to DC.   Expected Discharge Plan: Home/Self Care     Expected Discharge Plan and Services Expected Discharge Plan: Home/Self Care   Discharge Planning Services: CM Consult     Expected Discharge Date: 05/17/19                       Prior Living Arrangements/Services       Do you feel safe going back to the place where you live?: Yes      Need for Family Participation in Patient Care: No (Comment)     Criminal Activity/Legal Involvement Pertinent to Current Situation/Hospitalization: No - Comment as needed  Activities of Daily Living Home Assistive Devices/Equipment: None ADL Screening (condition at time of admission) Patient's cognitive ability adequate to safely complete daily activities?: Yes Is the patient deaf or have difficulty hearing?: No Does the patient have difficulty seeing, even when wearing glasses/contacts?: No Does the patient have difficulty concentrating, remembering, or making decisions?: No Patient able to express need for assistance with ADLs?: Yes Does the patient have difficulty dressing or bathing?: No Independently performs ADLs?: Yes (appropriate for developmental age) Does the patient have difficulty walking or climbing stairs?: No Weakness of Legs: None Weakness of Arms/Hands: None   Emotional  Assessment   Attitude/Demeanor/Rapport: Avoidant Affect (typically observed): Blunt Orientation: : Oriented to Self, Oriented to Place, Oriented to  Time Alcohol / Substance Use: Not Applicable Psych Involvement: No (comment)  Admission diagnosis:  HCAP (healthcare-associated pneumonia) [J18.9] Patient Active Problem List   Diagnosis Date Noted  . PNA (pneumonia) 05/15/2019  . Multifocal pneumonia 03/11/2019  . Acute respiratory failure with hypoxemia ([place]) 03/11/2019  . Sinus tachycardia 03/11/2019  . Sepsis due to pneumonia ([place]) 03/11/2019  . Hypothyroidism 03/11/2019  . Bronchopneumonia 11/06/2018  . Acute Respiratory failure with hypoxia (2/2 Asp PNA) 11/06/2018  . Recurrent aspiration pneumonia ([place])   . Fever 06/09/2018  . Tachycardia 06/09/2018  . Hypoxia 06/09/2018  . Hypotension 06/09/2018  . Esophageal dysmotility/Esophageal stricture/stenosis/esophageal dysmotility with recurrent aspiration   . Aspiration pneumonia of right lower lobe ([place])   . Sepsis ([place]) 05/26/2018  . Pulmonary edema 02/02/2018  . HCAP (healthcare-associated pneumonia) 02/02/2018  . Weakness generalized 02/02/2018  . Hypertension 02/02/2018  . CHF (congestive heart failure) ([place]) 02/02/2018  . Nausea & vomiting 02/02/2018  . Dysphagia 02/02/2018  . Esophageal stricture/stenosis/esophageal dysmotility with recurrent aspiration 02/02/2018  . Esophageal stenosis 02/02/2018  . GERD (gastroesophageal reflux disease) 02/02/2018  . Polysubstance abuse ([place]) 02/02/2018  . Acute respiratory failure with hypoxia ([place])   . Transaminitis   . Elevated troponin   . Overdose 01/02/2018  . Hx of hepatitis C 01/17/2013  . Depression 01/17/2013  . Anxiety with depression 01/17/2013  . Right knee pain 01/17/2013   PCP:  [facility] Pharmacy:  [facility] [address], [place] - [address] Dol[place]is Hall [address] [place] [address] Phone: [phone] Fax: [phone]  Isac Sarna [place], [place] - [place] [address] [place] [address] Phone: [phone] Fax: [phone]

## 2019-05-17 NOTE — Discharge Instructions (Signed)
1) please take medications as prescribed- 2) avoid amphetamines 3) please follow-up with Dr. Laural Golden

## 2019-05-18 ENCOUNTER — Encounter (INDEPENDENT_AMBULATORY_CARE_PROVIDER_SITE_OTHER): Payer: Self-pay

## 2019-05-20 IMAGING — RF DG ESOPHAGUS
7 of 8 series · 20 of 24 positions shown · non-contrast
Comparison: Prior study 01/06/2018

CLINICAL DATA: Esophageal dysphagia.  Episodes of pneumonia.

EXAM:
ESOPHOGRAM/BARIUM SWALLOW
TECHNIQUE: Single contrast examination was performed using  thin barium.
FLUOROSCOPY TIME:  Fluoroscopy Time:  1 minutes and 48 seconds
Radiation Exposure Index (if provided by the fluoroscopic device):
24.9 mGy
Number of Acquired Spot Images: 0

[Series 1: cp_standard · 0.51mm/px · 2 of 39 frames shown (1 of 7)]
[frame 6/39]
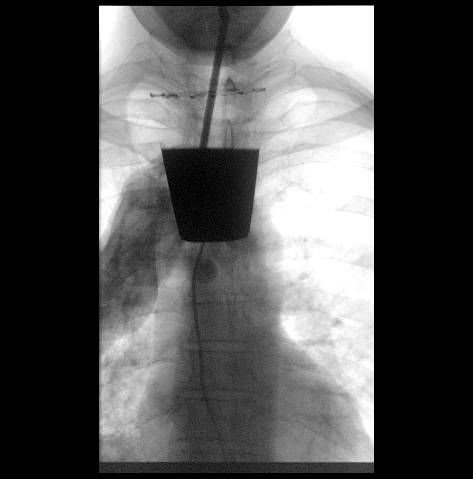
[frame 17/39]
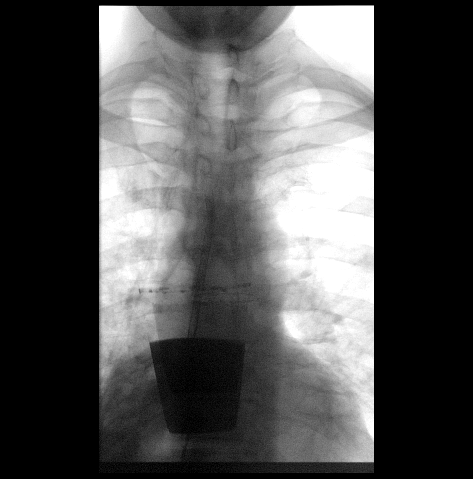

[Series 2: cp_standard · 0.51mm/px · 4 of 15 frames shown (2 of 7)]
[frame 3/15]
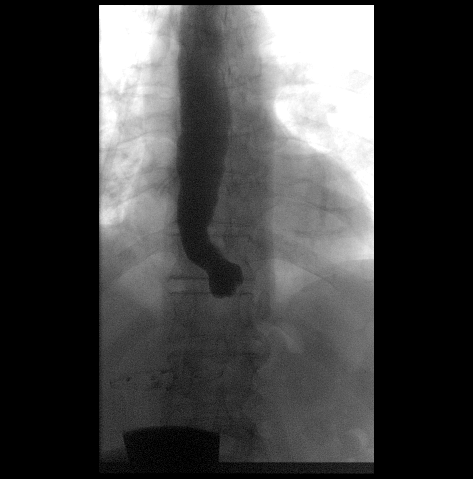
[frame 7/15]
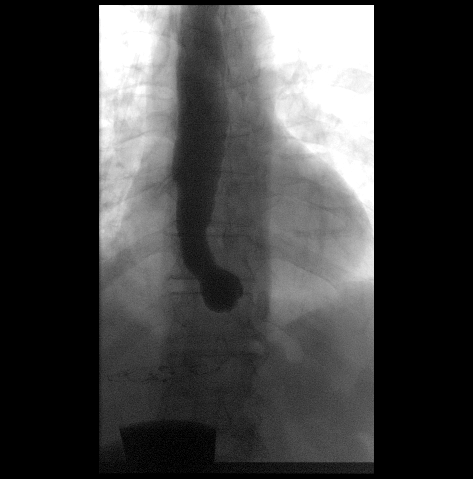
[frame 8/15]
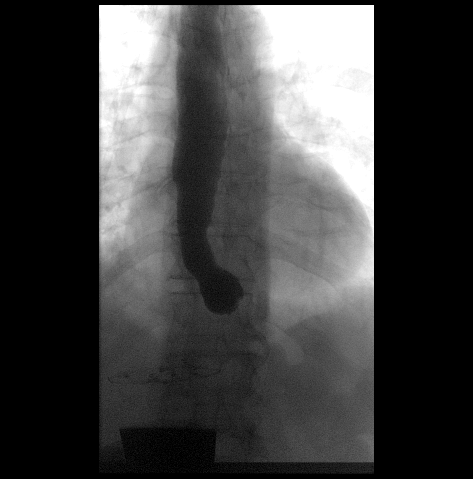
[frame 13/15]
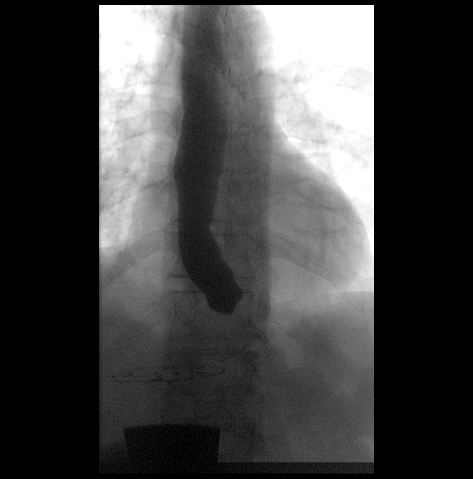

[Series 3: cp_standard · 0.51mm/px · 2 of 53 frames shown (3 of 7)]
[frame 10/53]
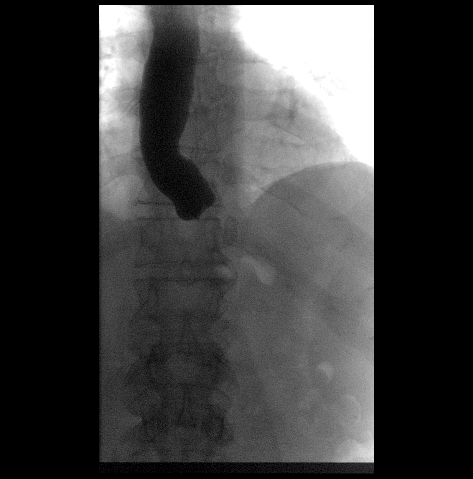
[frame 46/53]
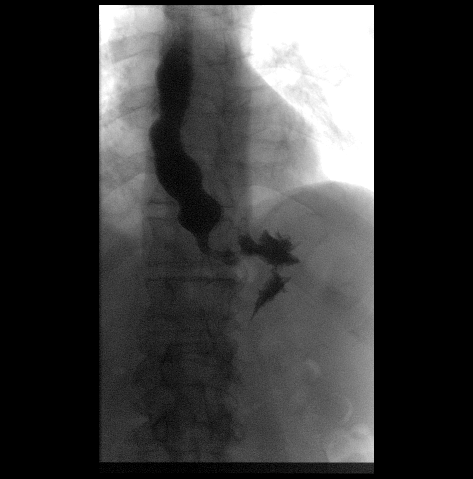

[Series 4: cp_standard · 0.51mm/px · 3 of 28 frames shown (4 of 7)]
[frame 1/28]
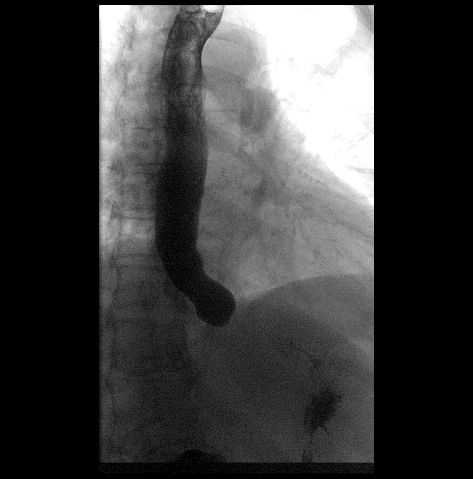
[frame 5/28]
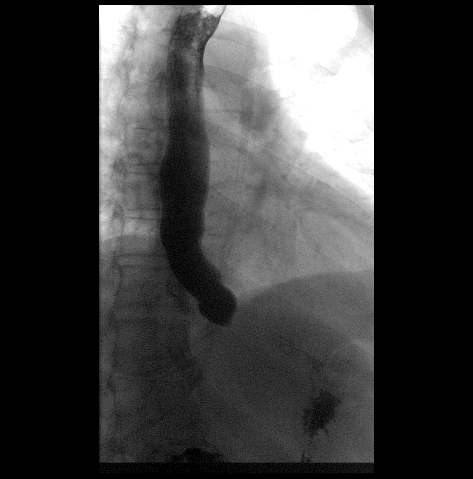
[frame 24/28]
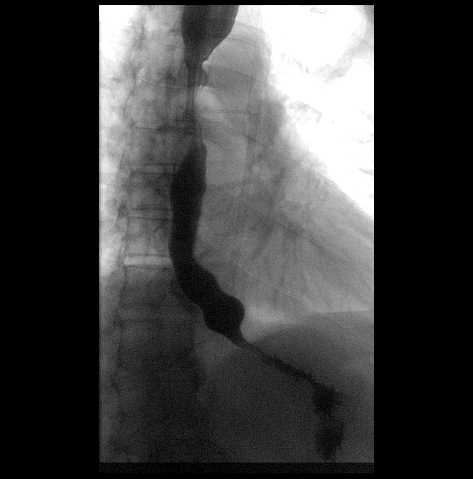

[Series 5: cp_standard · 0.51mm/px · 3 of 69 frames shown (5 of 7)]
[frame 11/69]
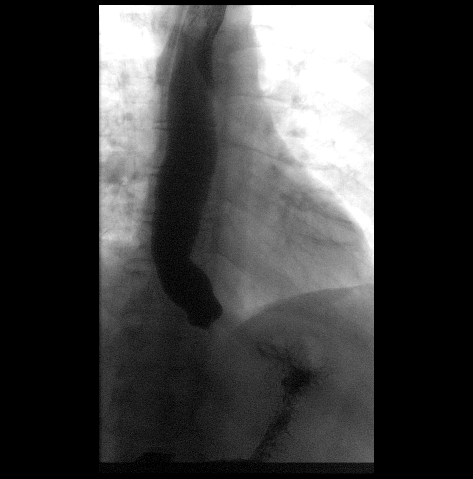
[frame 35/69]
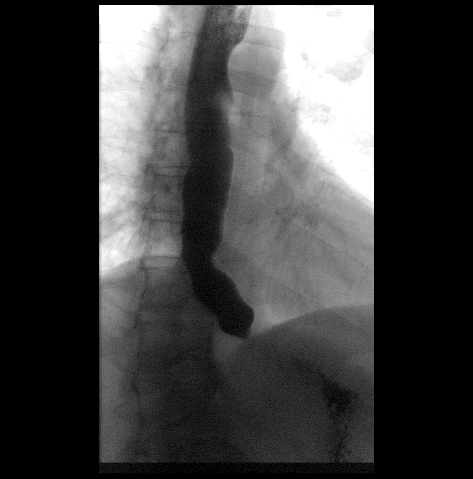
[frame 59/69]
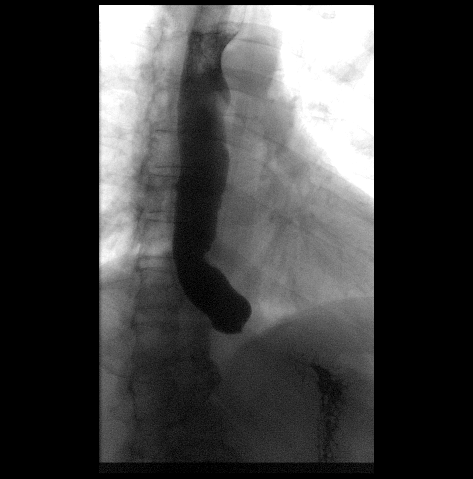

[Series 6: cp_standard · 0.51mm/px · 3 of 33 frames shown (6 of 7)]
[frame 5/33]
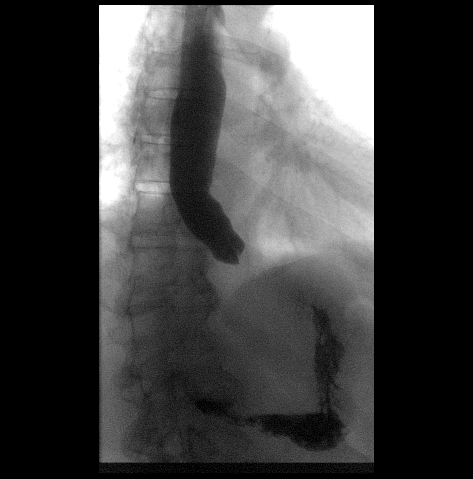
[frame 17/33]
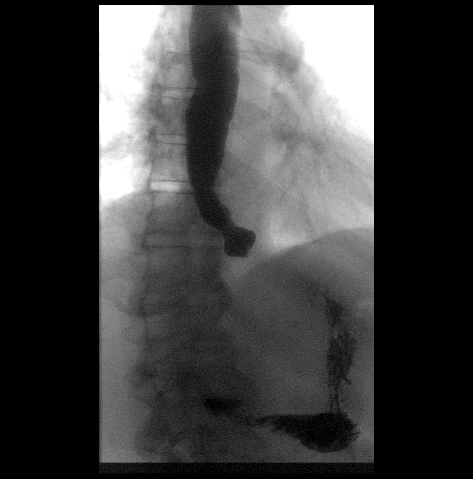
[frame 29/33]
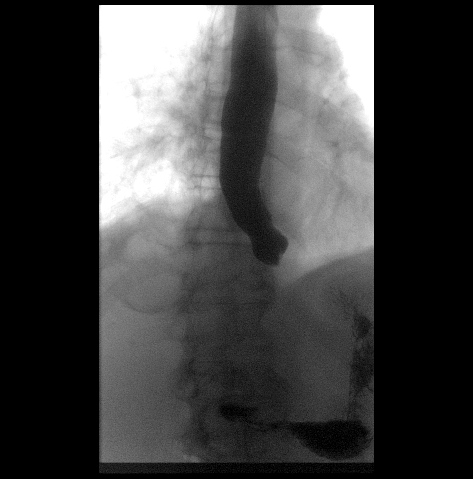

[Series 8: cp_standard · 0.51mm/px · 3 of 23 frames shown (7 of 7)]
[frame 8/23]
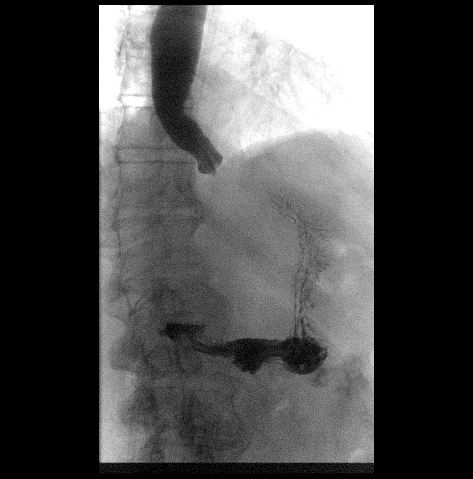
[frame 12/23]
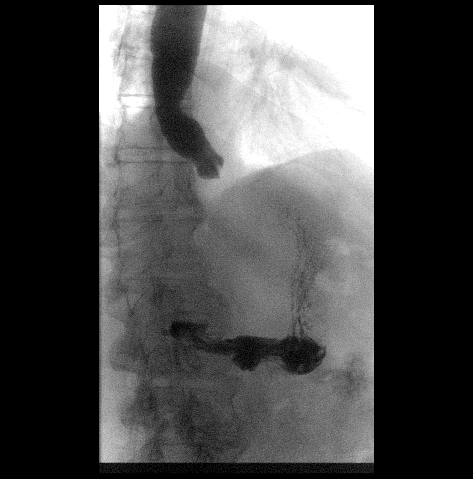
[frame 20/23]
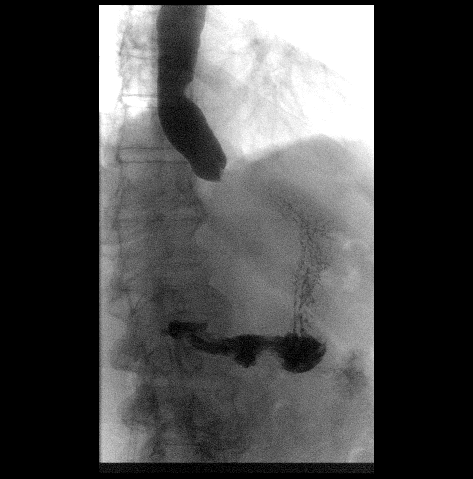

[20 of 24 positions shown; findings below may reference images not displayed]

FINDINGS: Esophageal dysmotility with disruption of the primary peristaltic
wave and tertiary contractions. Very little barium would pass into
the stomach. Smooth strictured narrowing of the distal esophagus
just above the GE junction similar to prior examination.
IMPRESSION: Persistent smooth strictured narrowing of the distal esophagus with
very little passage of barium into the stomach.

## 2019-05-22 ENCOUNTER — Encounter (INDEPENDENT_AMBULATORY_CARE_PROVIDER_SITE_OTHER): Payer: Self-pay | Admitting: *Deleted

## 2019-05-22 ENCOUNTER — Other Ambulatory Visit: Payer: Self-pay

## 2019-05-22 ENCOUNTER — Encounter (INDEPENDENT_AMBULATORY_CARE_PROVIDER_SITE_OTHER): Payer: Self-pay | Admitting: Internal Medicine

## 2019-05-22 ENCOUNTER — Ambulatory Visit (INDEPENDENT_AMBULATORY_CARE_PROVIDER_SITE_OTHER): Payer: Self-pay | Admitting: Internal Medicine

## 2019-05-22 VITALS — BP 127/98 | HR 109 | Temp 98.5°F | Ht 72.0 in | Wt 183.4 lb

## 2019-05-22 DIAGNOSIS — K224 Dyskinesia of esophagus: Secondary | ICD-10-CM

## 2019-05-22 NOTE — Progress Notes (Signed)
Presenting complaint;  Follow-up for dysphagia.  Database and subjective:  Patient is 58 year old Caucasian male who was evaluated last year for esophageal dysphagia.  He was admitted to [facility] with aspiration pneumonia felt to be related to drug overdose.  Barium study revealed distal esophageal stricture.  Esophagogastroduodenoscopy was performed on 03/18/2018 which suggested stricture at distal esophagus proximal to GE junction.  Her mucosa was normal.  It was dilated to 18 mm the balloon but no mucosal disruption was noted.  I therefore felt he may have esophageal motility disorder. He underwent esophageal manometry at [facility] on 06/16/2019 which revealed 10 out of 10 swallows resulted in normal peristalsis.  LES pressure was elevated with incomplete relaxation.  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  He did not meet criterion for achalasia given normal peristalsis.  Patient was begun on diltiazem 30 mg before each meal.  He did not show up for follow-up visit.  When I talk with patient on 04/29/2019 he was not even sure if he took diltiazem and if it helped him.  Anyway he was begun on diltiazem 60 mg by mouth 30 minutes before each meal. Patient was hospitalized last week for pneumonia felt to be aspiration pneumonia.  He finished antibiotic yesterday. He was discharged on diltiazem or Cardizem 120 mg daily.  He remains with dysphagia.  He has learned to eat slowly and he tries to drink water to push food down.  At times he can hear gurgling in his throat.  He has intermittent regurgitation.  He has not lost any weight in the last 5 years.  He feels heartburn is well controlled with pantoprazole.  He says he was diagnosed sleep apnea and will be using CPAP as soon as it it is fitted.  Current Medications: Outpatient Encounter Medications as of 05/22/2019  Medication Sig  . acetaminophen (TYLENOL) 325 MG tablet Take 2 tablets (650 mg total) by mouth every 6 (six) hours as needed for mild  pain (or Fever >/= 101).  Marland Kitchen albuterol (VENTOLIN HFA) 108 (90 Base) MCG/ACT inhaler Inhale 2 puffs into the lungs every 6 (six) hours as needed for wheezing or shortness of breath (cough).  . ALPRAZolam (XANAX) 0.5 MG tablet Take 0.5 mg by mouth 2 (two) times daily.  Marland Kitchen aspirin EC 81 MG tablet Take 1 tablet (81 mg total) by mouth daily with breakfast.  . diltiazem (CARDIZEM CD) 120 MG 24 hr capsule Take 1 capsule (120 mg total) by mouth daily.  Marland Kitchen levothyroxine (SYNTHROID, LEVOTHROID) 25 MCG tablet Take 25 mcg by mouth daily.  . Multiple Vitamin (MULTIVITAMIN WITH MINERALS) TABS tablet Take 1 tablet by mouth daily. 50+  . pantoprazole (PROTONIX) 40 MG tablet Take 1 tablet (40 mg total) by mouth 2 (two) times daily.  Marland Kitchen amoxicillin-clavulanate (AUGMENTIN) 875-125 MG tablet Take 1 tablet by mouth 2 (two) times daily for 5 days. (Patient not taking: Reported on 05/22/2019)  . guaiFENesin (MUCINEX) 600 MG 12 hr tablet Take 1 tablet (600 mg total) by mouth 2 (two) times daily for 10 days. (Patient not taking: Reported on 05/22/2019)   No facility-administered encounter medications on file as of 05/22/2019.      Objective: Blood pressure (!) 127/98, pulse (!) 109, temperature 98.5 F (36.9 C), temperature source Oral, height 6' (1.829 m), weight 183 lb 6.4 oz (83.2 kg). Patient is alert and in no acute distress. Conjunctiva is pink. Sclera is nonicteric Oropharyngeal mucosa is normal. No neck masses or thyromegaly noted. Cardiac  exam with regular rhythm normal S1 and S2. No murmur or gallop noted. Lungs are clear to auscultation. Abdomen is soft and nontender with organomegaly or masses. No LE edema or clubbing noted.   Assessment:  #1.  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  He had 0 bolus clearance.  It remains to be seen if his condition would evolve into achalasia.  Calcium channel blocker dose can be  increased as long as he has no side effects.  So far so good. It would be reasonable to reevaluate him with esophagogram to to review esophageal caliber and and lateral segment. He may have to be referred to to esophagus clinic at [facility] for further therapy such as Botox.   Plan:  Patient advised to eat slowly and chew his food thoroughly.  Patient advised to take only small sips of water and he should be sitting upright at mealtime. Esophagogram to be scheduled. Further recommendations will be made after esophagogram completed. Office visit in 3 months.

## 2019-05-22 NOTE — Patient Instructions (Signed)
Remember to take diltiazem 120 mg at least 1 hour before breakfast every morning. Physician will call with results of barium study when completed.

## 2019-05-23 DIAGNOSIS — K222 Esophageal obstruction: Secondary | ICD-10-CM

## 2019-05-24 ENCOUNTER — Ambulatory Visit (INDEPENDENT_AMBULATORY_CARE_PROVIDER_SITE_OTHER): Payer: Medicaid Other | Admitting: Internal Medicine

## 2019-05-29 ENCOUNTER — Other Ambulatory Visit: Payer: Self-pay

## 2019-05-29 ENCOUNTER — Ambulatory Visit (HOSPITAL_COMMUNITY)
Admission: RE | Admit: 2019-05-29 | Discharge: 2019-05-29 | Disposition: A | Discharge status: Home / Self Care | Payer: Self-pay | Source: Ambulatory Visit | Attending: Internal Medicine | Admitting: Internal Medicine

## 2019-05-29 ENCOUNTER — Telehealth: Payer: Self-pay | Admitting: Gastroenterology

## 2019-05-29 DIAGNOSIS — K224 Dyskinesia of esophagus: Secondary | ICD-10-CM | POA: Insufficient documentation

## 2019-05-29 NOTE — Telephone Encounter (Signed)
Called pt, and was advised to speak with spouse Estill Bamberg.  Made her aware of recs to present to [facility] for Chest x-ray tomorrow. She states someone has already called and made her aware of this. I advised no appt is needed.

## 2019-05-29 NOTE — Telephone Encounter (Addendum)
UGI today had to be terminated due to lack of contrast transit. During exam, no contrast traversed GE junction. Obstruction at level of GE junction. Interestingly, patient notes he drank a milkshake this morning. Recommendations were to have patient admitted for obs status due to high risk of aspiration. Both patient and wife present with radiologist, who discussed findings with patient. I also discussed with patient as well the risks of aspiration and recommendations to be observed overnight. He is declining admission. Desires to go home. He was advised to avoid oral intake for 6 hours, then may have clear liquids. Sit upright for at least 3 hours after any oral ingestion.   Will have chest xray PA/lateral tomorrow.  Also routing to Dr. Laural Golden. Patient in no distress today and going home with family member.   [facility] clinical pool: can you call patient and have him present to admissions tomorrow for chest xray? I believe it is a walk-in appt. I have placed orders.

## 2019-06-10 ENCOUNTER — Other Ambulatory Visit: Payer: Self-pay

## 2019-06-10 ENCOUNTER — Observation Stay (HOSPITAL_COMMUNITY)
Admission: EM | Admit: 2019-06-10 | Discharge: 2019-06-11 | Disposition: A | Discharge status: Home / Self Care | Payer: Self-pay | Attending: Internal Medicine | Admitting: Internal Medicine

## 2019-06-10 ENCOUNTER — Emergency Department (HOSPITAL_COMMUNITY): Payer: Self-pay

## 2019-06-10 ENCOUNTER — Encounter (HOSPITAL_COMMUNITY): Payer: Self-pay | Admitting: Emergency Medicine

## 2019-06-10 DIAGNOSIS — J189 Pneumonia, unspecified organism: Secondary | ICD-10-CM | POA: Diagnosis not present

## 2019-06-10 DIAGNOSIS — J9601 Acute respiratory failure with hypoxia: Secondary | ICD-10-CM | POA: Diagnosis not present

## 2019-06-10 DIAGNOSIS — Y939 Activity, unspecified: Secondary | ICD-10-CM | POA: Diagnosis not present

## 2019-06-10 DIAGNOSIS — Z20828 Contact with and (suspected) exposure to other viral communicable diseases: Secondary | ICD-10-CM | POA: Insufficient documentation

## 2019-06-10 DIAGNOSIS — F1910 Other psychoactive substance abuse, uncomplicated: Secondary | ICD-10-CM | POA: Insufficient documentation

## 2019-06-10 DIAGNOSIS — S2224XA Fracture of xiphoid process, initial encounter for closed fracture: Secondary | ICD-10-CM | POA: Insufficient documentation

## 2019-06-10 DIAGNOSIS — R1013 Epigastric pain: Secondary | ICD-10-CM | POA: Insufficient documentation

## 2019-06-10 DIAGNOSIS — S29001A Unspecified injury of muscle and tendon of front wall of thorax, initial encounter: Secondary | ICD-10-CM | POA: Diagnosis present

## 2019-06-10 DIAGNOSIS — K2289 Other specified disease of esophagus: Secondary | ICD-10-CM

## 2019-06-10 DIAGNOSIS — Z79899 Other long term (current) drug therapy: Secondary | ICD-10-CM | POA: Insufficient documentation

## 2019-06-10 DIAGNOSIS — I509 Heart failure, unspecified: Secondary | ICD-10-CM | POA: Diagnosis not present

## 2019-06-10 DIAGNOSIS — J690 Pneumonitis due to inhalation of food and vomit: Secondary | ICD-10-CM

## 2019-06-10 DIAGNOSIS — I110 Hypertensive heart disease with heart failure: Secondary | ICD-10-CM | POA: Insufficient documentation

## 2019-06-10 DIAGNOSIS — Q393 Congenital stenosis and stricture of esophagus: Secondary | ICD-10-CM | POA: Insufficient documentation

## 2019-06-10 DIAGNOSIS — Y929 Unspecified place or not applicable: Secondary | ICD-10-CM | POA: Diagnosis not present

## 2019-06-10 DIAGNOSIS — R0902 Hypoxemia: Secondary | ICD-10-CM

## 2019-06-10 DIAGNOSIS — E039 Hypothyroidism, unspecified: Secondary | ICD-10-CM | POA: Insufficient documentation

## 2019-06-10 DIAGNOSIS — Y999 Unspecified external cause status: Secondary | ICD-10-CM | POA: Insufficient documentation

## 2019-06-10 DIAGNOSIS — F418 Other specified anxiety disorders: Secondary | ICD-10-CM | POA: Diagnosis present

## 2019-06-10 DIAGNOSIS — K224 Dyskinesia of esophagus: Secondary | ICD-10-CM

## 2019-06-10 DIAGNOSIS — K228 Other specified diseases of esophagus: Secondary | ICD-10-CM

## 2019-06-10 LAB — COMPREHENSIVE METABOLIC PANEL
ALT: 16 U/L (ref 0–44)
AST: 19 U/L (ref 15–41)
Albumin: 4.2 g/dL (ref 3.5–5.0)
Alkaline Phosphatase: 48 U/L (ref 38–126)
Anion gap: 7 (ref 5–15)
BUN: 13 mg/dL (ref 6–20)
CO2: 28 mmol/L (ref 22–32)
Calcium: 8.5 mg/dL — ABNORMAL LOW (ref 8.9–10.3)
Chloride: 106 mmol/L (ref 98–111)
Creatinine, Ser: 0.7 mg/dL (ref 0.61–1.24)
GFR calc Af Amer: >60 mL/min (ref >60)
GFR calc non Af Amer: >60 mL/min (ref >60)
Glucose, Bld: 119 mg/dL — ABNORMAL HIGH (ref 70–99)
Potassium: 3.8 mmol/L (ref 3.5–5.1)
Sodium: 141 mmol/L (ref 135–145)
Total Bilirubin: 0.3 mg/dL (ref 0.3–1.2)
Total Protein: 6.8 g/dL (ref 6.5–8.1)

## 2019-06-10 LAB — URINALYSIS, ROUTINE W REFLEX MICROSCOPIC
Bilirubin Urine: NEGATIVE
Glucose, UA: NEGATIVE mg/dL
Hgb urine dipstick: NEGATIVE
Ketones, ur: NEGATIVE mg/dL
Leukocytes,Ua: NEGATIVE
Nitrite: NEGATIVE
Protein, ur: NEGATIVE mg/dL
Specific Gravity, Urine: 1.016 (ref 1.005–1.030)
pH: 6 (ref 5.0–8.0)

## 2019-06-10 LAB — CBC WITH DIFFERENTIAL/PLATELET
Abs Immature Granulocytes: 0.01 10*3/uL (ref 0.00–0.07)
Basophils Absolute: 0 10*3/uL (ref 0.0–0.1)
Basophils Relative: 1 %
Eosinophils Absolute: 0.2 10*3/uL (ref 0.0–0.5)
Eosinophils Relative: 6 %
HCT: 39.2 % (ref 39.0–52.0)
Hemoglobin: 12.6 g/dL — ABNORMAL LOW (ref 13.0–17.0)
Immature Granulocytes: 0 %
Lymphocytes Relative: 35 %
Lymphs Abs: 1.2 10*3/uL (ref 0.7–4.0)
MCH: 30.4 pg (ref 26.0–34.0)
MCHC: 32.1 g/dL (ref 30.0–36.0)
MCV: 94.5 fL (ref 80.0–100.0)
Monocytes Absolute: 0.3 10*3/uL (ref 0.1–1.0)
Monocytes Relative: 7 %
Neutro Abs: 1.8 10*3/uL (ref 1.7–7.7)
Neutrophils Relative %: 51 %
Platelets: 166 10*3/uL (ref 150–400)
RBC: 4.15 MIL/uL — ABNORMAL LOW (ref 4.22–5.81)
RDW: 12.6 % (ref 11.5–15.5)
WBC: 3.6 10*3/uL — ABNORMAL LOW (ref 4.0–10.5)
nRBC: 0 % (ref 0.0–0.2)

## 2019-06-10 LAB — TROPONIN I (HIGH SENSITIVITY)
Troponin I (High Sensitivity): <2 ng/L (ref <18)
Troponin I (High Sensitivity): <2 ng/L (ref <18)

## 2019-06-10 LAB — LACTIC ACID, PLASMA
Lactic Acid, Venous: 3.3 mmol/L (ref 0.5–1.9)
Lactic Acid, Venous: 1.4 mmol/L (ref 0.5–1.9)

## 2019-06-10 LAB — LIPASE, BLOOD: Lipase: 20 U/L (ref 11–51)

## 2019-06-10 LAB — PROTIME-INR
INR: 1.1 (ref 0.8–1.2)
Prothrombin Time: 13.6 seconds (ref 11.4–15.2)

## 2019-06-10 LAB — APTT: aPTT: 28 seconds (ref 24–36)

## 2019-06-10 LAB — PROCALCITONIN: Procalcitonin: 0.56 ng/mL

## 2019-06-10 LAB — SARS CORONAVIRUS 2 (TAT 6-24 HRS): SARS Coronavirus 2: NEGATIVE

## 2019-06-10 MED ORDER — SODIUM CHLORIDE 0.9 % IV BOLUS (SEPSIS)
1000.0000 mL | Freq: Once | INTRAVENOUS | Status: AC
  Administered 2019-06-10: 10:00:00 1000 mL via INTRAVENOUS

## 2019-06-10 MED ORDER — SODIUM CHLORIDE 0.9 % IV SOLN
2.0000 g | Freq: Once | INTRAVENOUS | Status: AC
  Administered 2019-06-10: 2 g via INTRAVENOUS
  Filled 2019-06-10: qty 2

## 2019-06-10 MED ORDER — SODIUM CHLORIDE 0.9 % IV BOLUS
1000.0000 mL | Freq: Once | INTRAVENOUS | Status: AC
  Administered 2019-06-10: 1000 mL via INTRAVENOUS

## 2019-06-10 MED ORDER — SODIUM CHLORIDE 0.9 % IV BOLUS (SEPSIS)
1000.0000 mL | Freq: Once | INTRAVENOUS | Status: AC
  Administered 2019-06-10: 1000 mL via INTRAVENOUS

## 2019-06-10 MED ORDER — ACETAMINOPHEN 650 MG RE SUPP: 650.0000 mg | Freq: Four times a day (QID) | RECTAL | Status: DC | PRN

## 2019-06-10 MED ORDER — SODIUM CHLORIDE 0.9 % IV SOLN
2.0000 g | Freq: Three times a day (TID) | INTRAVENOUS | Status: DC
  Administered 2019-06-10 (×2): 2 g via INTRAVENOUS
  Filled 2019-06-10 (×3): qty 2

## 2019-06-10 MED ORDER — ADULT MULTIVITAMIN W/MINERALS CH
1.0000 | ORAL_TABLET | Freq: Every day | ORAL | Status: DC
  Administered 2019-06-11: 1 via ORAL
  Filled 2019-06-10: qty 1

## 2019-06-10 MED ORDER — ACETAMINOPHEN 325 MG PO TABS
650.0000 mg | ORAL_TABLET | Freq: Four times a day (QID) | ORAL | Status: DC | PRN
  Administered 2019-06-11: 650 mg via ORAL
  Filled 2019-06-10: qty 2

## 2019-06-10 MED ORDER — IOHEXOL 350 MG/ML SOLN
100.0000 mL | Freq: Once | INTRAVENOUS | Status: AC | PRN
  Administered 2019-06-10: 100 mL via INTRAVENOUS

## 2019-06-10 MED ORDER — VANCOMYCIN HCL 10 G IV SOLR
1500.0000 mg | Freq: Two times a day (BID) | INTRAVENOUS | Status: DC
  Administered 2019-06-10 – 2019-06-11 (×2): 1500 mg via INTRAVENOUS
  Filled 2019-06-10 (×2): qty 1500

## 2019-06-10 MED ORDER — SODIUM CHLORIDE 0.9 % IV SOLN
1.0000 g | Freq: Once | INTRAVENOUS | Status: DC
  Filled 2019-06-10: qty 1

## 2019-06-10 MED ORDER — ONDANSETRON HCL 4 MG PO TABS: 4.0000 mg | ORAL_TABLET | Freq: Four times a day (QID) | ORAL | Status: DC | PRN

## 2019-06-10 MED ORDER — VANCOMYCIN HCL 10 G IV SOLR
1500.0000 mg | Freq: Once | INTRAVENOUS | Status: AC
  Administered 2019-06-10: 1500 mg via INTRAVENOUS
  Filled 2019-06-10: qty 1500

## 2019-06-10 MED ORDER — POTASSIUM CHLORIDE IN NACL 20-0.9 MEQ/L-% IV SOLN
INTRAVENOUS | Status: DC
  Administered 2019-06-10: 20:00:00 via INTRAVENOUS

## 2019-06-10 MED ORDER — ONDANSETRON HCL 4 MG/2ML IJ SOLN: 4.0000 mg | Freq: Four times a day (QID) | INTRAMUSCULAR | Status: DC | PRN

## 2019-06-10 MED ORDER — LEVOTHYROXINE SODIUM 25 MCG PO TABS
25.0000 ug | ORAL_TABLET | Freq: Every day | ORAL | Status: DC
  Administered 2019-06-11: 25 ug via ORAL
  Filled 2019-06-10: qty 1

## 2019-06-10 MED ORDER — PANTOPRAZOLE SODIUM 40 MG PO TBEC
40.0000 mg | DELAYED_RELEASE_TABLET | Freq: Two times a day (BID) | ORAL | Status: DC
  Administered 2019-06-10 – 2019-06-11 (×2): 40 mg via ORAL
  Filled 2019-06-10 (×2): qty 1

## 2019-06-10 MED ORDER — DILTIAZEM HCL ER COATED BEADS 120 MG PO CP24
120.0000 mg | ORAL_CAPSULE | Freq: Every day | ORAL | Status: DC
  Administered 2019-06-11: 120 mg via ORAL
  Filled 2019-06-10: qty 1

## 2019-06-10 MED ORDER — ENOXAPARIN SODIUM 40 MG/0.4ML ~~LOC~~ SOLN
40.0000 mg | SUBCUTANEOUS | Status: DC
  Administered 2019-06-10: 40 mg via SUBCUTANEOUS
  Filled 2019-06-10: qty 0.4

## 2019-06-10 MED ORDER — TRAMADOL HCL 50 MG PO TABS: 50.0000 mg | ORAL_TABLET | Freq: Four times a day (QID) | ORAL | Status: DC | PRN

## 2019-06-10 MED ORDER — ASPIRIN EC 81 MG PO TBEC
81.0000 mg | DELAYED_RELEASE_TABLET | Freq: Every day | ORAL | Status: DC
  Administered 2019-06-11: 09:00:00 81 mg via ORAL
  Filled 2019-06-10: qty 1

## 2019-06-10 NOTE — ED Notes (Signed)
CRITICAL VALUE ALERT  Critical Value:  Lactic acid 3.3  Date & Time Notied:  06/10/19 0908  Provider Notified: Eulis Foster MD  Orders Received/Actions taken: n/a
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
## 2019-06-10 NOTE — ED Notes (Signed)
Sats increased to 98% on 2L.

## 2019-06-10 NOTE — ED Notes (Signed)
O2 on hold per Dr Eulis Foster.
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
## 2019-06-10 NOTE — ED Notes (Signed)
Pt transported to CT ?

## 2019-06-10 NOTE — Progress Notes (Addendum)
Pharmacy Antibiotic Note  Christian Ellison is a 58 y.o. male admitted on 06/10/2019 with pneumonia.  Pharmacy has been consulted for Vancomycin dosing. ED MD also ordered Cefepime x 1.  Plan: Vancomycin 1500mg  IV q12hs. Goal AUC 400-550. Expected AUC: 497 Cefepime 2000 mg IV every 8 hours. Monitor labs, c/s, and vanco levels as indicated   Height: 6' (182.9 cm) Weight: 190 lb (86.2 kg) IBW/kg (Calculated) : 77.6  Temp (24hrs), Avg:98.4 F (36.9 C), Min:98.4 F (36.9 C), Max:98.4 F (36.9 C)  Recent Labs  Lab 06/10/19 0302  WBC 3.6*  CREATININE 0.70    Estimated Creatinine Clearance: 111.8 mL/min (by C-G formula based on SCr of 0.7 mg/dL).    No Known Allergies  Antimicrobials this admission: 10/24 Vanc >>  10/24 Cefepime >>  Microbiology results: BCx: ngtd UCx: pending MRSA PCR: pending  Thank you for allowing pharmacy to be a part of this patient's care.  Margot Ables, PharmD Clinical Pharmacist 06/10/2019 2:39 PM

## 2019-06-10 NOTE — ED Notes (Signed)
Pt's sats 88% on RA. Will place pt back on 2L nasal cannula. EDP notified.

## 2019-06-10 NOTE — ED Notes (Signed)
Pt returned from CT. Per CT tech, IV would not flush and was causing pt pain. This RN removed IV.

## 2019-06-10 NOTE — ED Notes (Signed)
Placed on 4 liters oxygen saturation 84 on room air. Saturation came up to 92 on 4 liters

## 2019-06-11 LAB — RAPID URINE DRUG SCREEN, HOSP PERFORMED
Amphetamines: NOT DETECTED
Barbiturates: NOT DETECTED
Benzodiazepines: POSITIVE — AB
Cocaine: NOT DETECTED
Opiates: NOT DETECTED
Tetrahydrocannabinol: NOT DETECTED

## 2019-06-11 LAB — BASIC METABOLIC PANEL
Anion gap: 7 (ref 5–15)
BUN: 14 mg/dL (ref 6–20)
CO2: 26 mmol/L (ref 22–32)
Calcium: 8.4 mg/dL — ABNORMAL LOW (ref 8.9–10.3)
Chloride: 106 mmol/L (ref 98–111)
Creatinine, Ser: 0.75 mg/dL (ref 0.61–1.24)
GFR calc Af Amer: >60 mL/min (ref >60)
GFR calc non Af Amer: >60 mL/min (ref >60)
Glucose, Bld: 143 mg/dL — ABNORMAL HIGH (ref 70–99)
Potassium: 3.9 mmol/L (ref 3.5–5.1)
Sodium: 139 mmol/L (ref 135–145)

## 2019-06-11 LAB — MRSA PCR SCREENING: MRSA by PCR: NEGATIVE

## 2019-06-11 LAB — CBC
HCT: 36.1 % — ABNORMAL LOW (ref 39.0–52.0)
Hemoglobin: 11.5 g/dL — ABNORMAL LOW (ref 13.0–17.0)
MCH: 30 pg (ref 26.0–34.0)
MCHC: 31.9 g/dL (ref 30.0–36.0)
MCV: 94.3 fL (ref 80.0–100.0)
Platelets: 139 10*3/uL — ABNORMAL LOW (ref 150–400)
RBC: 3.83 MIL/uL — ABNORMAL LOW (ref 4.22–5.81)
RDW: 12.3 % (ref 11.5–15.5)
WBC: 7.1 10*3/uL (ref 4.0–10.5)
nRBC: 0 % (ref 0.0–0.2)

## 2019-06-11 MED ORDER — ALPRAZOLAM 0.5 MG PO TABS
0.5000 mg | ORAL_TABLET | Freq: Once | ORAL | Status: AC
  Administered 2019-06-11: 02:00:00 0.5 mg via ORAL
  Filled 2019-06-11: qty 1

## 2019-06-11 MED ORDER — AMOXICILLIN-POT CLAVULANATE 875-125 MG PO TABS: 1.0000 | ORAL_TABLET | Freq: Two times a day (BID) | ORAL | Status: DC

## 2019-06-11 MED ORDER — AMOXICILLIN-POT CLAVULANATE 875-125 MG PO TABS: 1.0000 | ORAL_TABLET | Freq: Two times a day (BID) | ORAL | 0 refills | Status: DC

## 2019-06-11 NOTE — Discharge Summary (Addendum)
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 acute traumatic injury in the chest, abdomen or pelvis. 3. Patchy ground-glass centrilobular nodularity throughout the right lung, new since 02/13/2019, favor infectious or inflammatory bronchiolitis. 4. Nonspecific circumferential wall thickening in the lower thoracic esophagus, most commonly due to reflux esophagitis, with other etiologies including Barrett's esophagus or soft tissue neoplasm not excluded by CT. Outpatient GI consultation suggested. 5. Aortic Atherosclerosis (ICD10-I70.0) and Emphysema (ICD10-J43.9). Electronically Signed   By: Delbert Phenix M.D.   On: 06/10/2019 10:26   Dg Chest Portable  1 View  Result Date: 06/10/2019 CLINICAL DATA:  57 year old male with chest pain. Trauma. EXAM: PORTABLE CHEST 1 VIEW COMPARISON:  Chest radiograph dated 05/15/2019 FINDINGS: Mild bilateral interstitial densities may represent atelectatic changes. Atypical infiltrate is not excluded. Clinical correlation is recommended. No focal consolidation, pleural effusion, or pneumothorax. The cardiac silhouette is within normal limits. No acute osseous pathology. IMPRESSION: 1. No focal consolidation. 2. Mild bilateral interstitial densities may represent  atelectatic changes. Atypical infiltrate is not excluded. Electronically Signed   By: Elgie Collard M.D.   On: 06/10/2019 02:47   Dg Chest Port 1 View  Result Date: 05/15/2019 CLINICAL DATA:  Shortness of breath, weakness EXAM: PORTABLE CHEST 1 VIEW COMPARISON:  05/04/2019 FINDINGS: Cardiac shadow is stable. The lungs are well aerated bilaterally. Slight increased basilar opacity on the left is noted likely representing some mild atelectasis or early infiltrate. No other focal abnormality is seen. IMPRESSION: Increasing basilar opacity on the left. Electronically Signed   By: Alcide Clever M.D.   On: 05/15/2019 12:19   Dg Esophagus W Double Cm (hd)  Result Date: 05/29/2019 CLINICAL DATA:  58 year old male with history of dysphagia. Difficulty swallowing liquids and solids. History of esophageal dilatation 1 year ago. EXAM: ESOPHOGRAM / BARIUM SWALLOW / BARIUM TABLET STUDY TECHNIQUE: Combined double contrast and single contrast examination performed using effervescent crystals, thick barium liquid, and thin barium liquid. The patient was observed with fluoroscopy swallowing a 13 mm barium sulphate tablet. FLUOROSCOPY TIME:  Fluoroscopy Time:  2 minutes and 24 seconds Radiation Exposure Index (if provided by the fluoroscopic device): 15.9 mGy Number of Acquired Spot Images: 0 COMPARISON:  05/27/2018. FINDINGS: Initial double contrast images of the esophagus demonstrate normal appearance of the esophageal mucosa. Multiple single swallow attempts were observed, which demonstrated failure to propagate any normal primary peristaltic waves. Tertiary contractions were observed throughout the examination. Notably, at no point during the examination did contrast traverse the gastroesophageal junction. No definite esophageal mass, although there was some distal esophageal fold thickening shortly before the gastroesophageal junction. No esophageal ring. Because of lack of transit of contrast material beyond the  gastroesophageal junction, the examination was terminated. IMPRESSION: 1. Obstruction at the level of the gastroesophageal junction. There is some distal fold thickening in the esophagus immediately above the gastroesophageal junction, but no discrete mass is confidently identified on today's limited examination. 2. 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 Electronically Signed   By: Trudie Reed M.D.   On: 05/29/2019 10:48     Microbiology: Recent Results (from the past 240 hour(s))  MRSA PCR Screening     Status: None   Collection Time: 06/10/19 12:40 AM   Specimen: Nasopharyngeal  Result Value Ref Range Status   MRSA by PCR NEGATIVE NEGATIVE Final    Comment:        The GeneXpert MRSA Assay (FDA approved for NASAL specimens only), is one component of a comprehensive MRSA colonization surveillance program. It is not intended to diagnose MRSA infection nor to guide or monitor treatment for MRSA infections. Performed at [facility], [address] [place], [place] [address]   SARS CORONAVIRUS 2 (Christian Ellison 6-24 HRS) Nasopharyngeal Nasopharyngeal Swab     Status: None   Collection Time: 06/10/19  5:03 AM   Specimen: Nasopharyngeal Swab  Result Value Ref Range Status   SARS Coronavirus 2 NEGATIVE NEGATIVE Final    Comment: (NOTE) SARS-CoV-2 target nucleic acids are NOT DETECTED. The SARS-CoV-2 RNA is generally detectable in upper and lower respiratory  specimens during the acute phase of infection. Negative results do not preclude SARS-CoV-2 infection, do not rule out co-infections with other pathogens, and should not be used as the sole basis for treatment or other patient management decisions. Negative results must be combined with clinical observations, patient history, and epidemiological information. The expected result is Negative. Fact Sheet for Patients: HairSlick.no Fact Sheet for Healthcare  Providers: quierodirigir.com This test is not yet approved or cleared by the [place] FDA and  has been authorized for detection and/or diagnosis of SARS-CoV-2 by FDA under an Emergency Use Authorization (EUA). This EUA will remain  in effect (meaning this test can be used) for the duration of the COVID-19 declaration under Section 56 4(b)(1) of the Act, 21 U.S.C. section 360bbb-3(b)(1), unless the authorization is terminated or revoked sooner. Performed at [facility], [address], [place], [place] [address]   Blood culture (routine x 2)     Status: None (Preliminary result)   Collection Time: 06/10/19  8:24 AM   Specimen: BLOOD RIGHT FOREARM  Result Value Ref Range Status   Specimen Description BLOOD RIGHT FOREARM BOTTLES DRAWN AEROBIC ONLY  Final   Special Requests   Final    Blood Culture results may not be optimal due to an inadequate volume of blood received in culture bottles   Culture   Final    NO GROWTH 1 DAY Performed at [facility], [address] [place], [place] [address]    Report Status PENDING  Incomplete  Blood culture (routine x 2)     Status: None (Preliminary result)   Collection Time: 06/10/19  9:56 AM   Specimen: BLOOD LEFT HAND  Result Value Ref Range Status   Specimen Description   Final    BLOOD LEFT HAND BOTTLES DRAWN AEROBIC AND ANAEROBIC   Special Requests Blood Culture adequate volume  Final   Culture   Final    NO GROWTH < 24 HOURS Performed at [facility], [address], [place], [place] [address]    Report Status PENDING  Incomplete     Labs: Basic Metabolic Panel: Recent Labs  Lab 06/10/19 0302 06/11/19 0557  NA 141 139  K 3.8 3.9  CL 106 106  CO2 28 26  GLUCOSE 119* 143*  BUN 13 14  CREATININE 0.70 0.75  CALCIUM 8.5* 8.4*   Liver Function Tests: Recent Labs  Lab 06/10/19 0302  AST 19  ALT 16  ALKPHOS 48  BILITOT 0.3  PROT 6.8  ALBUMIN 4.2   Recent Labs  Lab 06/10/19 0302   LIPASE 20   No results for input(s): AMMONIA in the last 168 hours. CBC: Recent Labs  Lab 06/10/19 0302 06/11/19 0557  WBC 3.6* 7.1  NEUTROABS 1.8  --   HGB 12.6* 11.5*  HCT 39.2 36.1*  MCV 94.5 94.3  PLT 166 139*   Cardiac Enzymes: No results for input(s): CKTOTAL, CKMB, CKMBINDEX, TROPONINI in the last 168 hours. BNP: Invalid input(s): POCBNP CBG: No results for input(s): GLUCAP in the last 168 hours.  Time coordinating discharge:  36 minutes  Signed:  Catarina Hartshorn, DO [facility] Pager: [phone] 06/11/2019, 2:26 PM

## 2019-06-11 NOTE — Progress Notes (Signed)
Nsg Discharge Note  Admit Date:  06/10/2019 Discharge date: 06/11/2019   Gaynelle Arabian Letson to be D/C'd Home per MD order.  AVS completed.  Copy for chart, and copy for patient signed, and dated. Patient/caregiver able to verbalize understanding.  Discharge Medication: Allergies as of 06/11/2019   No Known Allergies     Medication List    TAKE these medications   acetaminophen 325 MG tablet Commonly known as: TYLENOL Take 2 tablets (650 mg total) by mouth every 6 (six) hours as needed for mild pain (or Fever >/= 101).   albuterol 108 (90 Base) MCG/ACT inhaler Commonly known as: VENTOLIN HFA Inhale 2 puffs into the lungs every 6 (six) hours as needed for wheezing or shortness of breath (cough).   ALPRAZolam 0.5 MG tablet Commonly known as: XANAX Take 0.5 mg by mouth 2 (two) times daily as needed.   amoxicillin-clavulanate 875-125 MG tablet Commonly known as: AUGMENTIN Take 1 tablet by mouth every 12 (twelve) hours.   aspirin EC 81 MG tablet Take 1 tablet (81 mg total) by mouth daily with breakfast.   diltiazem 120 MG 24 hr capsule Commonly known as: Cardizem CD Take 1 capsule (120 mg total) by mouth daily.   levothyroxine 25 MCG tablet Commonly known as: SYNTHROID Take 25 mcg by mouth daily.   multivitamin with minerals Tabs tablet Take 1 tablet by mouth daily. 50+   pantoprazole 40 MG tablet Commonly known as: Protonix Take 1 tablet (40 mg total) by mouth 2 (two) times daily.       Discharge Assessment: Vitals:   06/10/19 2156 06/11/19 0423  BP: (!) 144/78 107/70  Pulse: 87 84  Resp: 18 17  Temp: 99.2 F (37.3 C) 98.7 F (37.1 C)  SpO2: 94% 92%   Skin clean, dry and intact without evidence of skin break down, no evidence of skin tears noted. IV catheter discontinued intact. Site without signs and symptoms of complications - no redness or edema noted at insertion site, patient denies c/o pain - only slight tenderness at site.  Dressing with slight  pressure applied.  D/c Instructions-Education: Discharge instructions given to patient/family with verbalized understanding. D/c education completed with patient/family including follow up instructions, medication list, d/c activities limitations if indicated, with other d/c instructions as indicated by MD - patient able to verbalize understanding, all questions fully answered. Patient instructed to return to ED, call 911, or call MD for any changes in condition.  Patient escorted via [place], and D/C home via private auto.  Dorcas Mcmurray, LPN [id number] 0:34 PM

## 2019-06-12 LAB — URINE CULTURE: Culture: NO GROWTH

## 2019-06-15 LAB — CULTURE, BLOOD (ROUTINE X 2)
Culture: NO GROWTH
Culture: NO GROWTH
Special Requests: ADEQUATE

## 2019-06-23 ENCOUNTER — Other Ambulatory Visit: Payer: Self-pay

## 2019-06-23 ENCOUNTER — Encounter (HOSPITAL_COMMUNITY): Payer: Self-pay

## 2019-06-23 ENCOUNTER — Emergency Department (HOSPITAL_COMMUNITY): Payer: Self-pay

## 2019-06-23 ENCOUNTER — Inpatient Hospital Stay (HOSPITAL_COMMUNITY)
Admission: EM | Admit: 2019-06-23 | Discharge: 2019-06-25 | DRG: 177 | Disposition: A | Discharge status: Home / Self Care | Payer: Self-pay | Attending: Internal Medicine | Admitting: Internal Medicine

## 2019-06-23 DIAGNOSIS — Z803 Family history of malignant neoplasm of breast: Secondary | ICD-10-CM

## 2019-06-23 DIAGNOSIS — Z20828 Contact with and (suspected) exposure to other viral communicable diseases: Secondary | ICD-10-CM | POA: Diagnosis present

## 2019-06-23 DIAGNOSIS — Z8042 Family history of malignant neoplasm of prostate: Secondary | ICD-10-CM

## 2019-06-23 DIAGNOSIS — R0902 Hypoxemia: Secondary | ICD-10-CM

## 2019-06-23 DIAGNOSIS — E039 Hypothyroidism, unspecified: Secondary | ICD-10-CM | POA: Diagnosis present

## 2019-06-23 DIAGNOSIS — Z8249 Family history of ischemic heart disease and other diseases of the circulatory system: Secondary | ICD-10-CM

## 2019-06-23 DIAGNOSIS — Z8701 Personal history of pneumonia (recurrent): Secondary | ICD-10-CM

## 2019-06-23 DIAGNOSIS — F32A Depression, unspecified: Secondary | ICD-10-CM | POA: Diagnosis present

## 2019-06-23 DIAGNOSIS — J690 Pneumonitis due to inhalation of food and vomit: Principal | ICD-10-CM | POA: Diagnosis present

## 2019-06-23 DIAGNOSIS — J189 Pneumonia, unspecified organism: Secondary | ICD-10-CM

## 2019-06-23 DIAGNOSIS — K222 Esophageal obstruction: Secondary | ICD-10-CM | POA: Diagnosis present

## 2019-06-23 DIAGNOSIS — J9601 Acute respiratory failure with hypoxia: Secondary | ICD-10-CM | POA: Diagnosis present

## 2019-06-23 DIAGNOSIS — Z7982 Long term (current) use of aspirin: Secondary | ICD-10-CM

## 2019-06-23 DIAGNOSIS — S2224XA Fracture of xiphoid process, initial encounter for closed fracture: Secondary | ICD-10-CM | POA: Diagnosis present

## 2019-06-23 DIAGNOSIS — Z79890 Hormone replacement therapy (postmenopausal): Secondary | ICD-10-CM

## 2019-06-23 DIAGNOSIS — I10 Essential (primary) hypertension: Secondary | ICD-10-CM | POA: Diagnosis present

## 2019-06-23 DIAGNOSIS — K219 Gastro-esophageal reflux disease without esophagitis: Secondary | ICD-10-CM | POA: Diagnosis present

## 2019-06-23 DIAGNOSIS — F419 Anxiety disorder, unspecified: Secondary | ICD-10-CM | POA: Diagnosis present

## 2019-06-23 DIAGNOSIS — Z9119 Patient's noncompliance with other medical treatment and regimen: Secondary | ICD-10-CM

## 2019-06-23 DIAGNOSIS — Z8619 Personal history of other infectious and parasitic diseases: Secondary | ICD-10-CM | POA: Diagnosis present

## 2019-06-23 DIAGNOSIS — Z800 Family history of malignant neoplasm of digestive organs: Secondary | ICD-10-CM

## 2019-06-23 DIAGNOSIS — R0600 Dyspnea, unspecified: Secondary | ICD-10-CM

## 2019-06-23 DIAGNOSIS — F329 Major depressive disorder, single episode, unspecified: Secondary | ICD-10-CM | POA: Diagnosis present

## 2019-06-23 DIAGNOSIS — K224 Dyskinesia of esophagus: Secondary | ICD-10-CM | POA: Diagnosis present

## 2019-06-23 DIAGNOSIS — G4733 Obstructive sleep apnea (adult) (pediatric): Secondary | ICD-10-CM | POA: Diagnosis present

## 2019-06-23 DIAGNOSIS — Z8052 Family history of malignant neoplasm of bladder: Secondary | ICD-10-CM

## 2019-06-23 DIAGNOSIS — K3189 Other diseases of stomach and duodenum: Secondary | ICD-10-CM

## 2019-06-23 LAB — COMPREHENSIVE METABOLIC PANEL
ALT: 14 U/L (ref 0–44)
AST: 16 U/L (ref 15–41)
Albumin: 4 g/dL (ref 3.5–5.0)
Alkaline Phosphatase: 54 U/L (ref 38–126)
Anion gap: 8 (ref 5–15)
BUN: 8 mg/dL (ref 6–20)
CO2: 30 mmol/L (ref 22–32)
Calcium: 8.8 mg/dL — ABNORMAL LOW (ref 8.9–10.3)
Chloride: 99 mmol/L (ref 98–111)
Creatinine, Ser: 0.76 mg/dL (ref 0.61–1.24)
GFR calc Af Amer: >60 mL/min (ref >60)
GFR calc non Af Amer: >60 mL/min (ref >60)
Glucose, Bld: 129 mg/dL — ABNORMAL HIGH (ref 70–99)
Potassium: 4.8 mmol/L (ref 3.5–5.1)
Sodium: 137 mmol/L (ref 135–145)
Total Bilirubin: 0.7 mg/dL (ref 0.3–1.2)
Total Protein: 7.5 g/dL (ref 6.5–8.1)

## 2019-06-23 LAB — CBC WITH DIFFERENTIAL/PLATELET
Abs Immature Granulocytes: 0.03 10*3/uL (ref 0.00–0.07)
Basophils Absolute: 0 10*3/uL (ref 0.0–0.1)
Basophils Relative: 0 %
Eosinophils Absolute: 0.1 10*3/uL (ref 0.0–0.5)
Eosinophils Relative: 1 %
HCT: 41.6 % (ref 39.0–52.0)
Hemoglobin: 13.5 g/dL (ref 13.0–17.0)
Immature Granulocytes: 0 %
Lymphocytes Relative: 9 %
Lymphs Abs: 0.7 10*3/uL (ref 0.7–4.0)
MCH: 29.7 pg (ref 26.0–34.0)
MCHC: 32.5 g/dL (ref 30.0–36.0)
MCV: 91.6 fL (ref 80.0–100.0)
Monocytes Absolute: 0.3 10*3/uL (ref 0.1–1.0)
Monocytes Relative: 3 %
Neutro Abs: 6.7 10*3/uL (ref 1.7–7.7)
Neutrophils Relative %: 87 %
Platelets: 233 10*3/uL (ref 150–400)
RBC: 4.54 MIL/uL (ref 4.22–5.81)
RDW: 11.9 % (ref 11.5–15.5)
WBC: 7.8 10*3/uL (ref 4.0–10.5)
nRBC: 0 % (ref 0.0–0.2)

## 2019-06-23 LAB — BRAIN NATRIURETIC PEPTIDE: B Natriuretic Peptide: 47 pg/mL (ref 0.0–100.0)

## 2019-06-23 MED ORDER — VANCOMYCIN HCL 1.25 G IV SOLR
1250.0000 mg | Freq: Once | INTRAVENOUS | Status: AC
  Administered 2019-06-24: 1250 mg via INTRAVENOUS
  Filled 2019-06-23: qty 1250

## 2019-06-23 MED ORDER — SODIUM CHLORIDE 0.9 % IV BOLUS
500.0000 mL | Freq: Once | INTRAVENOUS | Status: AC
  Administered 2019-06-24: 500 mL via INTRAVENOUS

## 2019-06-23 MED ORDER — VANCOMYCIN HCL 1.25 G IV SOLR
1250.0000 mg | Freq: Once | INTRAVENOUS | Status: DC
  Filled 2019-06-23: qty 1250

## 2019-06-23 MED ORDER — VANCOMYCIN HCL 10 G IV SOLR
1250.0000 mg | Freq: Once | INTRAVENOUS | Status: DC
  Filled 2019-06-23: qty 1250

## 2019-06-23 MED ORDER — PIPERACILLIN-TAZOBACTAM 3.375 G IVPB 30 MIN
3.3750 g | Freq: Once | INTRAVENOUS | Status: AC
  Administered 2019-06-24: 3.375 g via INTRAVENOUS
  Filled 2019-06-23: qty 50

## 2019-06-23 NOTE — ED Notes (Signed)
Patient given ice water at this time. EDP at bedside

## 2019-06-23 NOTE — ED Notes (Signed)
Pt states he took Tylenol at apprx 1630 today for a headache.
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
## 2019-06-24 ENCOUNTER — Other Ambulatory Visit: Payer: Self-pay

## 2019-06-24 ENCOUNTER — Encounter (HOSPITAL_COMMUNITY): Payer: Self-pay | Admitting: Emergency Medicine

## 2019-06-24 DIAGNOSIS — S2242XD Multiple fractures of ribs, left side, subsequent encounter for fracture with routine healing: Secondary | ICD-10-CM

## 2019-06-24 DIAGNOSIS — S2224XD Fracture of xiphoid process, subsequent encounter for fracture with routine healing: Secondary | ICD-10-CM

## 2019-06-24 DIAGNOSIS — J189 Pneumonia, unspecified organism: Secondary | ICD-10-CM | POA: Diagnosis present

## 2019-06-24 LAB — CBC WITH DIFFERENTIAL/PLATELET
Abs Immature Granulocytes: 0.03 10*3/uL (ref 0.00–0.07)
Basophils Absolute: 0 10*3/uL (ref 0.0–0.1)
Basophils Relative: 0 %
Eosinophils Absolute: 0.1 10*3/uL (ref 0.0–0.5)
Eosinophils Relative: 1 %
HCT: 36.8 % — ABNORMAL LOW (ref 39.0–52.0)
Hemoglobin: 11.9 g/dL — ABNORMAL LOW (ref 13.0–17.0)
Immature Granulocytes: 0 %
Lymphocytes Relative: 24 %
Lymphs Abs: 1.6 10*3/uL (ref 0.7–4.0)
MCH: 29.8 pg (ref 26.0–34.0)
MCHC: 32.3 g/dL (ref 30.0–36.0)
MCV: 92 fL (ref 80.0–100.0)
Monocytes Absolute: 0.5 10*3/uL (ref 0.1–1.0)
Monocytes Relative: 7 %
Neutro Abs: 4.7 10*3/uL (ref 1.7–7.7)
Neutrophils Relative %: 68 %
Platelets: 201 10*3/uL (ref 150–400)
RBC: 4 MIL/uL — ABNORMAL LOW (ref 4.22–5.81)
RDW: 11.9 % (ref 11.5–15.5)
WBC: 6.9 10*3/uL (ref 4.0–10.5)
nRBC: 0 % (ref 0.0–0.2)

## 2019-06-24 LAB — HIV ANTIBODY (ROUTINE TESTING W REFLEX): HIV Screen 4th Generation wRfx: NONREACTIVE

## 2019-06-24 LAB — BASIC METABOLIC PANEL
Anion gap: 9 (ref 5–15)
BUN: 9 mg/dL (ref 6–20)
CO2: 31 mmol/L (ref 22–32)
Calcium: 8.7 mg/dL — ABNORMAL LOW (ref 8.9–10.3)
Chloride: 100 mmol/L (ref 98–111)
Creatinine, Ser: 0.75 mg/dL (ref 0.61–1.24)
GFR calc Af Amer: >60 mL/min (ref >60)
GFR calc non Af Amer: >60 mL/min (ref >60)
Glucose, Bld: 145 mg/dL — ABNORMAL HIGH (ref 70–99)
Potassium: 4.3 mmol/L (ref 3.5–5.1)
Sodium: 140 mmol/L (ref 135–145)

## 2019-06-24 LAB — PROCALCITONIN: Procalcitonin: <0.1 ng/mL

## 2019-06-24 LAB — SARS CORONAVIRUS 2 (TAT 6-24 HRS): SARS Coronavirus 2: NEGATIVE

## 2019-06-24 MED ORDER — ACETAMINOPHEN 325 MG PO TABS: 650.0000 mg | ORAL_TABLET | Freq: Four times a day (QID) | ORAL | Status: DC | PRN

## 2019-06-24 MED ORDER — ACETAMINOPHEN 325 MG PO TABS
650.0000 mg | ORAL_TABLET | Freq: Once | ORAL | Status: AC
  Administered 2019-06-24: 650 mg via ORAL
  Filled 2019-06-24: qty 2

## 2019-06-24 MED ORDER — ASPIRIN EC 81 MG PO TBEC
81.0000 mg | DELAYED_RELEASE_TABLET | Freq: Every day | ORAL | Status: DC
  Administered 2019-06-24 – 2019-06-25 (×2): 81 mg via ORAL
  Filled 2019-06-24 (×2): qty 1

## 2019-06-24 MED ORDER — PANTOPRAZOLE SODIUM 40 MG IV SOLR
40.0000 mg | Freq: Two times a day (BID) | INTRAVENOUS | Status: DC
  Administered 2019-06-24: 40 mg via INTRAVENOUS
  Filled 2019-06-24: qty 40

## 2019-06-24 MED ORDER — VANCOMYCIN HCL 10 G IV SOLR: 1500.0000 mg | Freq: Two times a day (BID) | INTRAVENOUS | Status: DC

## 2019-06-24 MED ORDER — ALBUTEROL SULFATE HFA 108 (90 BASE) MCG/ACT IN AERS
2.0000 | INHALATION_SPRAY | Freq: Four times a day (QID) | RESPIRATORY_TRACT | Status: DC | PRN
  Filled 2019-06-24: qty 6.7

## 2019-06-24 MED ORDER — VANCOMYCIN HCL IN DEXTROSE 750-5 MG/150ML-% IV SOLN
750.0000 mg | Freq: Once | INTRAVENOUS | Status: AC
  Administered 2019-06-24: 750 mg via INTRAVENOUS
  Filled 2019-06-24: qty 150

## 2019-06-24 MED ORDER — LEVOTHYROXINE SODIUM 25 MCG PO TABS
25.0000 ug | ORAL_TABLET | Freq: Every day | ORAL | Status: DC
  Administered 2019-06-25: 25 ug via ORAL
  Filled 2019-06-24 (×2): qty 1

## 2019-06-24 MED ORDER — ALPRAZOLAM 0.5 MG PO TABS
0.5000 mg | ORAL_TABLET | Freq: Two times a day (BID) | ORAL | Status: DC | PRN
  Administered 2019-06-24 – 2019-06-25 (×2): 0.5 mg via ORAL
  Filled 2019-06-24 (×2): qty 1

## 2019-06-24 MED ORDER — DILTIAZEM HCL ER COATED BEADS 120 MG PO CP24
120.0000 mg | ORAL_CAPSULE | Freq: Every day | ORAL | Status: DC
  Administered 2019-06-24 – 2019-06-25 (×2): 120 mg via ORAL
  Filled 2019-06-24 (×4): qty 1

## 2019-06-24 MED ORDER — GUAIFENESIN-DM 100-10 MG/5ML PO SYRP: 5.0000 mL | ORAL_SOLUTION | ORAL | Status: DC | PRN

## 2019-06-24 MED ORDER — PANTOPRAZOLE SODIUM 40 MG IV SOLR
40.0000 mg | INTRAVENOUS | Status: DC
  Administered 2019-06-25: 40 mg via INTRAVENOUS
  Filled 2019-06-24: qty 40

## 2019-06-24 MED ORDER — ENOXAPARIN SODIUM 40 MG/0.4ML ~~LOC~~ SOLN
40.0000 mg | SUBCUTANEOUS | Status: DC
  Administered 2019-06-24 – 2019-06-25 (×2): 40 mg via SUBCUTANEOUS
  Filled 2019-06-24 (×2): qty 0.4

## 2019-06-24 MED ORDER — ADULT MULTIVITAMIN W/MINERALS CH
1.0000 | ORAL_TABLET | Freq: Every day | ORAL | Status: DC
  Administered 2019-06-24 – 2019-06-25 (×2): 1 via ORAL
  Filled 2019-06-24 (×2): qty 1

## 2019-06-24 MED ORDER — LACTATED RINGERS IV SOLN
INTRAVENOUS | Status: DC
  Administered 2019-06-24: 07:00:00 via INTRAVENOUS

## 2019-06-24 MED ORDER — SODIUM CHLORIDE 0.9 % IV SOLN: INTRAVENOUS | Status: DC | PRN

## 2019-06-24 MED ORDER — SODIUM CHLORIDE 0.9 % IV SOLN
2.0000 g | Freq: Three times a day (TID) | INTRAVENOUS | Status: DC
  Administered 2019-06-24 – 2019-06-25 (×4): 2 g via INTRAVENOUS
  Filled 2019-06-24 (×4): qty 2

## 2019-06-24 NOTE — ED Notes (Signed)
Report to Lisa, RN

## 2019-06-24 NOTE — Progress Notes (Signed)
Per HPI: Christian Ellison is a 58 y.o. male with medical history significant of recurrent aspiration pneumonitis, esophageal stricture/esophageal dysmotility/esophageal outlet obstruction, treated hepatitis C, hypothyroidism, anxiety/depression, and polysubstance abuse who presented to the ER with shortness of breath.  Patient has been having shortness of breath worsening over the last 2 days.  He states he ate some spicy food 2 days ago and then started having increasing symptoms of reflux and then had worsening shortness of breath.  His oxygen saturation was noted to be in the 50s at one point as per the wife.  Is usually not on home oxygen.  Recently diagnosed with obstructive sleep apnea and has been on CPAP at night.  Has a history of recurrent aspiration pneumonitis secondary to esophageal dysmotility as per him.  Review of records shows patient was admitted from 06/10/2019-06/11/2019 with respiratory failure with hypoxia likely secondary to aspiration pneumonitis.  Patient has been admitted with acute hypoxemic respiratory failure possibly secondary to aspiration pneumonitis.  He states that he has difficulty with swallowing food and feels as though liquids get stuck in his throat.  He has had prior esophageal dilation over a year ago with Dr. Laural Golden and feels that he may potentially require repeat dilation.  SLP evaluation has been ordered, however they are not available today.  I have placed a consultation for GI to evaluate as he will potentially require endoscopy and repeat dilation.  Procalcitonin is low and patient has no leukocytosis.  Will discontinue vancomycin for now as MRSA PCR was previously negative.  Continue cefepime through today and anticipate discontinuation of blood cultures remain negative.  Will start soft dysphagia 3 diet for now. Follow-up a.m. labs.  Total care time: 25 minutes.

## 2019-06-24 NOTE — H&P (Signed)
History and Physical    Christian Ellison JYN:[id number] DOB: April 09, 1961 DOA: 06/23/2019  PCP: Jason Coop, FNP  Patient coming from: Home  I have personally briefly reviewed patient's old medical records in [facility]  Chief Complaint: Shortness of breath, cough, hypoxia.  HPI: Christian Ellison is a 58 y.o. male with medical history significant of recurrent aspiration pneumonitis, esophageal stricture/esophageal dysmotility/esophageal outlet obstruction, treated hepatitis C, hypothyroidism, anxiety/depression, and polysubstance abuse who presented to the ER with shortness of breath.  Patient has been having shortness of breath worsening over the last 2 days.  He states he ate some spicy food 2 days ago and then started having increasing symptoms of reflux and then had worsening shortness of breath.  His oxygen saturation was noted to be in the 50s at one point as per the wife.  Is usually not on home oxygen.  Recently diagnosed with obstructive sleep apnea and has been on CPAP at night.  Has a history of recurrent aspiration pneumonitis secondary to esophageal dysmotility as per him.  Review of records shows patient was admitted from 06/10/2019-06/11/2019 with respiratory failure with hypoxia likely secondary to aspiration pneumonitis. ED Course:  Vital Signs reviewed on presentation, significant for temperature 99.3, heart rate 85, blood pressure 135/78, saturation 93% on 4 L nasal cannula. Labs reviewed, significant for sodium 137, potassium 4.8, BUN 8, creatinine 0.76, LFTs within normal limits, BNP 47, WBC 7.8, hemoglobin 13.5, hematocrit 41, platelets 233, SARS-CoV-2 already PCR has been sent, urinalysis is negative, urine drug screen is positive for benzos, blood cultures have been ordered. Imaging personally Reviewed, chest x-ray shows patchy airspace opacities noted bilaterally which slightly progressed since the prior study. EKG personally reviewed, shows sinus rhythm,  no acute ST-T changes.  Review of Systems: As per HPI otherwise 10 point review of systems negative.  All other review of systems is negative except the ones noted above in the HPI.  Past Medical History:  Diagnosis Date  . Anxiety   . Aspiration pneumonia (HCC) 05/26/2018   RLL  . Chronic pain   . GERD (gastroesophageal reflux disease)   . 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   Marland Kitchen Opiate abuse, continuous (HCC)     Past Surgical History:  Procedure Laterality Date  . BIOPSY  03/18/2018   Procedure: BIOPSY;  Surgeon: Malissa Hippo, MD;  Location: [facility];  Service: Endoscopy;;  gastric   . COLONOSCOPY N/A 11/22/2013   Dr. Karilyn Cota: Normal except for hemorrhoids.  Next colonoscopy 10 years.  . ESOPHAGEAL DILATION N/A 03/18/2018   Procedure: ESOPHAGEAL DILATION;  Surgeon: Malissa Hippo, MD;  Location: [facility];  Service: Endoscopy;  Laterality: N/A;  . ESOPHAGEAL MANOMETRY N/A 06/15/2018   Procedure: ESOPHAGEAL MANOMETRY (EM);  Surgeon: Napoleon Form, MD;  Location: [facility];  Service: Endoscopy;  Laterality: N/A;  . ESOPHAGOGASTRODUODENOSCOPY (EGD) WITH PROPOFOL N/A 03/18/2018   Dr. Karilyn Cota: Benign-appearing mild esophageal stenosis proximal to the GE J, status post dilation.  2 cm hiatal hernia.  Gastritis, biopsies negative for H. pylori  . Fracture Right Leg     Patient has rod/screws in this leg  . Rotor Cuff  2012   Right Shoulder     reports that he has never smoked. He has never used smokeless tobacco. He reports current drug use. Drug: Benzodiazepines. He reports that he does not drink alcohol.  No Known Allergies  Family History  Problem Relation Age of Onset  .  Breast cancer Mother   . Colon cancer Father   . Bladder Cancer Father   . Prostate cancer Father   . Hypertension Sister   . Hypothyroidism Sister   . Healthy Sister   . Healthy Daughter   . Healthy Son   . Healthy Son    Family history reviewed, noted as above, not  pertinent to current presentation.   Prior to Admission medications   Medication Sig Start Date End Date Taking? Authorizing Provider  acetaminophen (TYLENOL) 325 MG tablet Take 2 tablets (650 mg total) by mouth every 6 (six) hours as needed for mild pain (or Fever >/= 101). 05/17/19  Yes Emokpae, Courage, MD  albuterol (VENTOLIN HFA) 108 (90 Base) MCG/ACT inhaler Inhale 2 puffs into the lungs every 6 (six) hours as needed for wheezing or shortness of breath (cough). 05/17/19  Yes Emokpae, Courage, MD  ALPRAZolam Duanne Moron) 0.5 MG tablet Take 0.5 mg by mouth 2 (two) times daily as needed.    Yes [provider]  amoxicillin-clavulanate (AUGMENTIN) 875-125 MG tablet Take 1 tablet by mouth every 12 (twelve) hours. 06/11/19  Yes TatShanon Brow, MD  aspirin EC 81 MG tablet Take 1 tablet (81 mg total) by mouth daily with breakfast. 03/13/19  Yes Emokpae, Courage, MD  diltiazem (CARDIZEM CD) 120 MG 24 hr capsule Take 1 capsule (120 mg total) by mouth daily. 05/17/19 05/16/20 Yes Emokpae, Courage, MD  levothyroxine (SYNTHROID, LEVOTHROID) 25 MCG tablet Take 25 mcg by mouth daily. 05/13/18  Yes [provider]  Multiple Vitamin (MULTIVITAMIN WITH MINERALS) TABS tablet Take 1 tablet by mouth daily. 50+   Yes [provider]  pantoprazole (PROTONIX) 40 MG tablet Take 1 tablet (40 mg total) by mouth 2 (two) times daily. 05/17/19 05/16/20 Yes Roxan Hockey, MD    Physical Exam: Vitals:   06/24/19 0200 06/24/19 0300 06/24/19 0315 06/24/19 0330  BP: (!) 100/52 (!) 101/52  (!) 100/51  Pulse: 66  67 65  Resp: 10 16 12  (!) 24  Temp:      TempSrc:      SpO2: 94%  98% 96%  Weight:      Height:        Constitutional: NAD, calm, comfortable Vitals:   06/24/19 0200 06/24/19 0300 06/24/19 0315 06/24/19 0330  BP: (!) 100/52 (!) 101/52  (!) 100/51  Pulse: 66  67 65  Resp: 10 16 12  (!) 24  Temp:      TempSrc:      SpO2: 94%  98% 96%  Weight:      Height:       Eyes: PERRL, lids and  conjunctivae normal ENMT: Mucous membranes are moist. Posterior pharynx clear of any exudate or lesions.Normal dentition.  Neck: normal, supple, no masses, no thyromegaly Respiratory: Scattered bilateral crepitations, no wheezing.  Normal respiratory effort. No accessory muscle use.  Cardiovascular: Regular rate and rhythm, no murmurs / rubs / gallops. No extremity edema. 2+ pedal pulses. No carotid bruits.  Abdomen: no tenderness, no masses palpated. No hepatosplenomegaly. Bowel sounds positive.  Musculoskeletal: no clubbing / cyanosis. No joint deformity upper and lower extremities. Good ROM, no contractures. Normal muscle tone.  Skin: no rashes, lesions, ulcers. No induration Neurologic: CN 2-12 grossly intact. Sensation intact, DTR normal. Strength 5/5 in all 4.  Psychiatric: Normal judgment and insight. Alert and oriented x 3. Normal mood.    Decubitus Ulcers: Not present on admission Catheters and tubes: None   Labs on Admission: I have personally reviewed following  labs and imaging studies  CBC: Recent Labs  Lab 06/23/19 2250  WBC 7.8  NEUTROABS 6.7  HGB 13.5  HCT 41.6  MCV 91.6  PLT 233   Basic Metabolic Panel: Recent Labs  Lab 06/23/19 2250  NA 137  K 4.8  CL 99  CO2 30  GLUCOSE 129*  BUN 8  CREATININE 0.76  CALCIUM 8.8*   GFR: Estimated Creatinine Clearance: 111.8 mL/min (by C-G formula based on SCr of 0.76 mg/dL). Liver Function Tests: Recent Labs  Lab 06/23/19 2250  AST 16  ALT 14  ALKPHOS 54  BILITOT 0.7  PROT 7.5  ALBUMIN 4.0   No results for input(s): LIPASE, AMYLASE in the last 168 hours. No results for input(s): AMMONIA in the last 168 hours. Coagulation Profile: No results for input(s): INR, PROTIME in the last 168 hours. Cardiac Enzymes: No results for input(s): CKTOTAL, CKMB, CKMBINDEX, TROPONINI in the last 168 hours. BNP (last 3 results) No results for input(s): PROBNP in the last 8760 hours. HbA1C: No results for input(s): HGBA1C  in the last 72 hours. CBG: No results for input(s): GLUCAP in the last 168 hours. Lipid Profile: No results for input(s): CHOL, HDL, LDLCALC, TRIG, CHOLHDL, LDLDIRECT in the last 72 hours. Thyroid Function Tests: No results for input(s): TSH, T4TOTAL, FREET4, T3FREE, THYROIDAB in the last 72 hours. Anemia Panel: No results for input(s): VITAMINB12, FOLATE, FERRITIN, TIBC, IRON, RETICCTPCT in the last 72 hours. Urine analysis:    Component Value Date/Time   COLORURINE COLORLESS (A) 06/10/2019 1220   APPEARANCEUR CLEAR 06/10/2019 1220   LABSPEC 1.016 06/10/2019 1220   PHURINE 6.0 06/10/2019 1220   GLUCOSEU NEGATIVE 06/10/2019 1220   HGBUR NEGATIVE 06/10/2019 1220   BILIRUBINUR NEGATIVE 06/10/2019 1220   KETONESUR NEGATIVE 06/10/2019 1220   PROTEINUR NEGATIVE 06/10/2019 1220   NITRITE NEGATIVE 06/10/2019 1220   LEUKOCYTESUR NEGATIVE 06/10/2019 1220    Radiological Exams on Admission: Dg Chest Portable 1 View  Result Date: 06/23/2019 CLINICAL DATA:  Shortness of breath EXAM: PORTABLE CHEST 1 VIEW COMPARISON:  06/10/2019 FINDINGS: Patchy airspace opacities are again noted bilaterally. These have slightly progressed since the prior study. There is no pneumothorax or large pleural effusion. The heart size remains stable from prior study. IMPRESSION: Worsening scattered bilateral airspace opacities concerning for multifocal pneumonia. Electronically Signed   By: Katherine Mantlehristopher  Green M.D.   On: 06/23/2019 23:08      Assessment/Plan Active Problems:   Hx of hepatitis C   Depression   Acute respiratory failure with hypoxia (HCC)   HCAP (healthcare-associated pneumonia)   Hypertension   Esophageal stricture/stenosis/esophageal dysmotility with recurrent aspiration   Esophageal stenosis   GERD (gastroesophageal reflux disease)   Hypothyroidism   Closed fracture of xiphoid process   Pneumonia     Principal Problem: Possible aspiration pneumonitis/HCAP Patient presented with  increased shortness of breath, cough over the last 2 days.  Worsening hypoxemia noted today.  Is a low-grade temperature on arrival.  Require supplemental oxygen at 4 L/min.  Chest x-ray shows worsening opacities.  No leukocytosis noted. Review of records shows patient was admitted as follows: 06/10/2019-06/11/2019 with respiratory failure with hypoxia likely secondary to aspiration pneumonitis. 05/15/2019-05/17/2019 with healthcare associated pneumonia 03/11/2019-03/13/2019 with multifocal pneumonia, possibly community-acquired Plan: We will place on IV antibiotics Send procalcitonin, sputum culture IV fluid resuscitation Monitor oxygen Incentive spirometry, flutter valve Monitor CBC, temp We will keep n.p.o. for now, swallow evaluation has been ordered due to recurrent history of aspirations  Other Active Problems:  Acute respiratory failure with hypoxia Possibly secondary to aspiration pneumonitis, decreased respiratory effort from pain. Saturations noted to be in the 50s on room air at home as per wife.  Patient is stable on 3 L nasal cannula in the ER. Oxygen as needed, will place on pulse oximetry  Polysubstance abuse Has prior history of using son's adderall.  -Urine drug screen--positive benzo -Previously presribed Xanax   Esophageal dysmotility/Lower esophageal stricture Patient has history of dysmotility, stricture.  Had a EGD with dilation in the past with Dr. Karilyn Cota.  Will place on IV PPI -Continue diltiazem -Patient has a scheduled follow-up with Dr. Karilyn Cota in January.  He is requesting to get referred to Dr. Benard Rink from the GI service instead.  Hypothyroidism -Continue Synthroid  Xiphoid process fracture -tramadol prn pain  Obstructive sleep apnea: Continue CPAP  History of hepatitis C: Complete therapy with Harvoni -Continue follow-up with gastroenterology service as an outpatient.  DVT prophylaxis: Lovenox Code Status:  Full code Family Communication:  N/A  Disposition Plan: Admitted with hypoxemia possibly secondary to aspiration.  We will keep n.p.o., will get swallow evaluation.  Consider barium swallow and GI consult. Consults called: N/A Admission status: Inpatient for pneumonia, hypoxemia   Lillie Columbia Cyndie Chime MD [facility]  If 7PM-7AM, please contact night-coverage   06/24/2019, 3:44 AM

## 2019-06-24 NOTE — Progress Notes (Signed)
Pharmacy Antibiotic Note  Christian Ellison is a 58 y.o. male admitted on 06/23/2019 with multi-focal pneumonia.  Pharmacy has been consulted for  vancomycin and Zosyn  dosing.  Plan: Start cefepime 2g IV q8h Loading dose:  vancomycin 2g  IV x1 dose Maintenance dose:    vancomycin 1.5 g IV q12h Goal vancomycin trough range:   15-20  mcg/mL Pharmacy will continue to monitor renal function, vancomycin troughs as clinically appropriate,  cultures and patient progress.   Height: 6' (182.9 cm) Weight: 185 lb (83.9 kg) IBW/kg (Calculated) : 77.6  Temp (24hrs), Avg:99.3 F (37.4 C), Min:99.3 F (37.4 C), Max:99.3 F (37.4 C)  Recent Labs  Lab 06/23/19 2250  WBC 7.8  CREATININE 0.76    Estimated Creatinine Clearance: 111.8 mL/min (by C-G formula based on SCr of 0.76 mg/dL).    No Known Allergies  Antimicrobials this admission: vancomycin 11/7 >>   Zosyn 11/7>> 11/7 cefepime 11/7>>   Microbiology results: 11/7 BCx2:   11/6 SARS CoV 2:    Thank you for allowing pharmacy to be a part of this patient's care.  Despina Pole 06/24/2019 4:20 AM

## 2019-06-25 DIAGNOSIS — J9601 Acute respiratory failure with hypoxia: Secondary | ICD-10-CM

## 2019-06-25 LAB — CBC
HCT: 35.7 % — ABNORMAL LOW (ref 39.0–52.0)
Hemoglobin: 11.7 g/dL — ABNORMAL LOW (ref 13.0–17.0)
MCH: 29.4 pg (ref 26.0–34.0)
MCHC: 32.8 g/dL (ref 30.0–36.0)
MCV: 89.7 fL (ref 80.0–100.0)
Platelets: 209 10*3/uL (ref 150–400)
RBC: 3.98 MIL/uL — ABNORMAL LOW (ref 4.22–5.81)
RDW: 11.8 % (ref 11.5–15.5)
WBC: 4.4 10*3/uL (ref 4.0–10.5)
nRBC: 0 % (ref 0.0–0.2)

## 2019-06-25 LAB — BASIC METABOLIC PANEL
Anion gap: 10 (ref 5–15)
BUN: 11 mg/dL (ref 6–20)
CO2: 31 mmol/L (ref 22–32)
Calcium: 9 mg/dL (ref 8.9–10.3)
Chloride: 100 mmol/L (ref 98–111)
Creatinine, Ser: 0.67 mg/dL (ref 0.61–1.24)
GFR calc Af Amer: >60 mL/min (ref >60)
GFR calc non Af Amer: >60 mL/min (ref >60)
Glucose, Bld: 107 mg/dL — ABNORMAL HIGH (ref 70–99)
Potassium: 3.9 mmol/L (ref 3.5–5.1)
Sodium: 141 mmol/L (ref 135–145)

## 2019-06-25 MED ORDER — ALBUTEROL SULFATE (2.5 MG/3ML) 0.083% IN NEBU: 2.5000 mg | INHALATION_SOLUTION | Freq: Four times a day (QID) | RESPIRATORY_TRACT | Status: DC | PRN

## 2019-06-25 NOTE — Discharge Summary (Signed)
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 minutes  SIGNED:   Erick Blinks, DO [facility] 06/25/2019, 10:21 AM  If 7PM-7AM, please contact night-coverage [email or web address]

## 2019-06-25 NOTE — Progress Notes (Signed)
Nsg Discharge Note  Admit Date:  06/23/2019 Discharge date: 06/25/2019   Gaynelle Arabian Barnette to be D/C'd Home per MD order.  AVS completed.  Patient able to verbalize understanding.  Discharge Medication: Allergies as of 06/25/2019   No Known Allergies     Medication List    STOP taking these medications   amoxicillin-clavulanate 875-125 MG tablet Commonly known as: AUGMENTIN     TAKE these medications   acetaminophen 325 MG tablet Commonly known as: TYLENOL Take 2 tablets (650 mg total) by mouth every 6 (six) hours as needed for mild pain (or Fever >/= 101).   albuterol 108 (90 Base) MCG/ACT inhaler Commonly known as: VENTOLIN HFA Inhale 2 puffs into the lungs every 6 (six) hours as needed for wheezing or shortness of breath (cough).   ALPRAZolam 0.5 MG tablet Commonly known as: XANAX Take 0.5 mg by mouth 2 (two) times daily as needed.   aspirin EC 81 MG tablet Take 1 tablet (81 mg total) by mouth daily with breakfast.   diltiazem 120 MG 24 hr capsule Commonly known as: Cardizem CD Take 1 capsule (120 mg total) by mouth daily.   levothyroxine 25 MCG tablet Commonly known as: SYNTHROID Take 25 mcg by mouth daily.   multivitamin with minerals Tabs tablet Take 1 tablet by mouth daily. 50+   pantoprazole 40 MG tablet Commonly known as: Protonix Take 1 tablet (40 mg total) by mouth 2 (two) times daily.       Discharge Assessment: Vitals:   06/24/19 2120 06/25/19 0630  BP: 116/69 119/78  Pulse: 60 (!) 55  Resp: 16 18  Temp: 98.1 F (36.7 C) 98.2 F (36.8 C)  SpO2: 95% 99%   Skin clean, dry and intact without evidence of skin break down, no evidence of skin tears noted. IV catheter discontinued intact. Site without signs and symptoms of complications - no redness or edema noted at insertion site, patient denies c/o pain - only slight tenderness at site.  Dressing with slight pressure applied.  D/c Instructions-Education: Discharge instructions given to patient  with verbalized understanding. D/c education completed with patient including follow up instructions, medication list, d/c activities limitations if indicated, with other d/c instructions as indicated by MD - patient able to verbalize understanding, all questions fully answered. Patient instructed to return to ED, call 911, or call MD for any changes in condition.  Patient escorted via [place], and D/C home via private auto.  Leonid Manus Loletha Grayer, RN 06/25/2019 11:36 AM

## 2019-06-25 NOTE — Evaluation (Signed)
Clinical/Bedside Swallow Evaluation Patient Details  Name: Christian Ellison MRN: [id number] Date of Birth: 1961-01-11  Today's Date: 06/25/2019 Time: SLP Start Time (ACUTE ONLY): 1115 SLP Stop Time (ACUTE ONLY): 1149 SLP Time Calculation (min) (ACUTE ONLY): 34 min  Past Medical History:  Past Medical History:  Diagnosis Date  . Anxiety   . Aspiration pneumonia ([place]) 05/26/2018   RLL  . Chronic pain   . GERD (gastroesophageal reflux disease)   . 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   Marland Kitchen Opiate abuse, continuous ([place])    Past Surgical History:  Past Surgical History:  Procedure Laterality Date  . BIOPSY  03/18/2018   Procedure: BIOPSY;  Surgeon: Rogene Houston, MD;  [facility];  Service: Endoscopy;;  gastric   . COLONOSCOPY N/A 11/22/2013   Dr. Laural Golden: Normal except for hemorrhoids.  Next colonoscopy 10 years.  . ESOPHAGEAL DILATION N/A 03/18/2018   Procedure: ESOPHAGEAL DILATION;  Surgeon: Rogene Houston, MD;  [facility];  Service: Endoscopy;  Laterality: N/A;  . ESOPHAGEAL MANOMETRY N/A 06/15/2018   Procedure: ESOPHAGEAL MANOMETRY (EM);  Surgeon: Mauri Pole, MD;  [facility];  Service: Endoscopy;  Laterality: N/A;  . ESOPHAGOGASTRODUODENOSCOPY (EGD) WITH PROPOFOL N/A 03/18/2018   Dr. Laural Golden: Benign-appearing mild esophageal stenosis proximal to the GE J, status post dilation.  2 cm hiatal hernia.  Gastritis, biopsies negative for H. pylori  . Fracture Right Leg     Patient has rod/screws in this leg  . Rotor Cuff  2012   Right Shoulder   HPI:  BERLYN Ellison is a 58 y.o. male with medical history significant of recurrent aspiration pneumonitis, esophageal stricture/esophageal dysmotility/esophageal outlet obstruction, treated hepatitis C, hypothyroidism, anxiety/depression, and polysubstance abuse who presented to the ER with shortness of breath.  Patient has been having shortness of breath worsening over the last 2 days.   He states he ate some spicy food 2 days ago and then started having increasing symptoms of reflux and then had worsening shortness of breath.  His oxygen saturation was noted to be in the 50s at one point as per the wife.  Is usually not on home oxygen.  Recently diagnosed with obstructive sleep apnea and has been on CPAP at night.  Has a history of recurrent aspiration pneumonitis secondary to esophageal dysmotility as per him.  Review of records shows patient was admitted from 06/10/2019-06/11/2019 with respiratory failure with hypoxia likely secondary to aspiration pneumonitis.   Assessment / Plan / Recommendation Clinical Impression  Pt was administered clinical swallowing evaluation with Pt sitting upright in bed; Pt with known esophageal dysphagia. Pt consumed regular textures and thin liquids without overt s/sx of oropharyngeal dysphagia. Pt reports his swallowing discomfort consists of globus sensation AFTER swallowing; food "just won't go down and it gets stuck" and sometimes comes all the way back up. Pt reports he eats large meals at the end of the day and lies down afterwards; He reports he knows he should not lie down after eating but he does it "most of the time" despite this recommendation. SLP reviewed esophageal precautions including 1) eat small more frequent meals (~6 meals a day) 2) remain upright for an hour after eating all meals 3) avoid spicy and highly acidic foods. It is possible and likely that episodic aspiration occurs during severe reflux which reportedly happens most frequently after a Pt eats a large meal and lies down flat. Pt is placed at mild/moderate risk for  aspiration secondary to known severe esophageal dysphagia. Highly recommend Pt strictly follow esophageal recommendations. There are no further ST needs noted at this time ST to sign off.  SLP Visit Diagnosis: Dysphagia, unspecified (R13.10)    Aspiration Risk  Mild aspiration risk;Moderate aspiration risk    Diet  Recommendation Regular;Thin liquid   Liquid Administration via: Cup;Straw Medication Administration: Whole meds with liquid Supervision: Intermittent supervision to cue for compensatory strategies;Patient able to self feed Compensations: Slow rate;Small sips/bites Postural Changes: Seated upright at 90 degrees(Remain upright for 1 hr after PO intake)    Other  Recommendations Oral Care Recommendations: Oral care BID Other Recommendations: Clarify dietary restrictions   Follow up Recommendations None             Prognosis Prognosis for Safe Diet Advancement: Good      Swallow Study   General Date of Onset: 06/23/19 HPI: Christian Ellison is a 58 y.o. male with medical history significant of recurrent aspiration pneumonitis, esophageal stricture/esophageal dysmotility/esophageal outlet obstruction, treated hepatitis C, hypothyroidism, anxiety/depression, and polysubstance abuse who presented to the ER with shortness of breath.  Patient has been having shortness of breath worsening over the last 2 days.  He states he ate some spicy food 2 days ago and then started having increasing symptoms of reflux and then had worsening shortness of breath.  His oxygen saturation was noted to be in the 50s at one point as per the wife.  Is usually not on home oxygen.  Recently diagnosed with obstructive sleep apnea and has been on CPAP at night.  Has a history of recurrent aspiration pneumonitis secondary to esophageal dysmotility as per him.  Review of records shows patient was admitted from 06/10/2019-06/11/2019 with respiratory failure with hypoxia likely secondary to aspiration pneumonitis. Type of Study: Bedside Swallow Evaluation Previous Swallow Assessment: BSE 12/2017 Diet Prior to this Study: Regular;Thin liquids Temperature Spikes Noted: No Respiratory Status: Room air History of Recent Intubation: No Behavior/Cognition: Alert;Cooperative;Pleasant mood Oral Cavity Assessment: Within Functional  Limits Oral Care Completed by SLP: Recent completion by staff Oral Cavity - Dentition: Adequate natural dentition Vision: Functional for self-feeding Self-Feeding Abilities: Able to feed self Patient Positioning: Upright in bed Baseline Vocal Quality: Normal Volitional Cough: Strong Volitional Swallow: Able to elicit    Oral/Motor/Sensory Function Overall Oral Motor/Sensory Function: Within functional limits   Ice Chips Ice chips: Within functional limits   Thin Liquid Thin Liquid: Within functional limits    Nectar Thick Nectar Thick Liquid: Not tested   Honey Thick Honey Thick Liquid: Not tested   Puree Puree: Not tested   Solid     Solid: Within functional limits     Jadiel Schmieder H. Romie Levee, CCC-SLP Speech Language Pathologist  Georgetta Haber 06/25/2019,12:14 PM

## 2019-06-26 ENCOUNTER — Ambulatory Visit (INDEPENDENT_AMBULATORY_CARE_PROVIDER_SITE_OTHER): Payer: Medicaid Other | Admitting: Nurse Practitioner

## 2019-06-29 LAB — CULTURE, BLOOD (ROUTINE X 2)
Culture: NO GROWTH
Culture: NO GROWTH
Special Requests: ADEQUATE
Special Requests: ADEQUATE

## 2019-07-07 ENCOUNTER — Other Ambulatory Visit: Payer: Self-pay

## 2019-07-07 DIAGNOSIS — Z20822 Contact with and (suspected) exposure to covid-19: Secondary | ICD-10-CM

## 2019-07-10 LAB — NOVEL CORONAVIRUS, NAA: SARS-CoV-2, NAA: NOT DETECTED

## 2019-08-14 ENCOUNTER — Encounter (INDEPENDENT_AMBULATORY_CARE_PROVIDER_SITE_OTHER): Payer: Self-pay | Admitting: *Deleted

## 2019-08-22 ENCOUNTER — Ambulatory Visit (INDEPENDENT_AMBULATORY_CARE_PROVIDER_SITE_OTHER): Payer: Medicaid Other | Admitting: Internal Medicine

## 2019-10-29 ENCOUNTER — Encounter (HOSPITAL_COMMUNITY): Payer: Self-pay | Admitting: Emergency Medicine

## 2019-10-29 ENCOUNTER — Other Ambulatory Visit: Payer: Self-pay

## 2019-10-29 ENCOUNTER — Inpatient Hospital Stay (HOSPITAL_COMMUNITY)
Admission: EM | Admit: 2019-10-29 | Discharge: 2019-10-30 | DRG: 177 | Disposition: A | Discharge status: Home / Self Care | Payer: 59 | Source: Ambulatory Visit | Attending: Internal Medicine | Admitting: Internal Medicine

## 2019-10-29 ENCOUNTER — Emergency Department (HOSPITAL_COMMUNITY): Payer: 59

## 2019-10-29 ENCOUNTER — Encounter: Payer: Self-pay | Admitting: *Deleted

## 2019-10-29 ENCOUNTER — Ambulatory Visit (INDEPENDENT_AMBULATORY_CARE_PROVIDER_SITE_OTHER)
Admission: EM | Admit: 2019-10-29 | Discharge: 2019-10-29 | Disposition: A | Discharge status: Home / Self Care | Payer: 59 | Source: Home / Self Care

## 2019-10-29 DIAGNOSIS — J9601 Acute respiratory failure with hypoxia: Secondary | ICD-10-CM | POA: Diagnosis not present

## 2019-10-29 DIAGNOSIS — Z20822 Contact with and (suspected) exposure to covid-19: Secondary | ICD-10-CM | POA: Diagnosis present

## 2019-10-29 DIAGNOSIS — E039 Hypothyroidism, unspecified: Secondary | ICD-10-CM | POA: Diagnosis present

## 2019-10-29 DIAGNOSIS — R509 Fever, unspecified: Secondary | ICD-10-CM | POA: Diagnosis not present

## 2019-10-29 DIAGNOSIS — F419 Anxiety disorder, unspecified: Secondary | ICD-10-CM | POA: Diagnosis present

## 2019-10-29 DIAGNOSIS — Z8052 Family history of malignant neoplasm of bladder: Secondary | ICD-10-CM

## 2019-10-29 DIAGNOSIS — Z8679 Personal history of other diseases of the circulatory system: Secondary | ICD-10-CM

## 2019-10-29 DIAGNOSIS — J690 Pneumonitis due to inhalation of food and vomit: Secondary | ICD-10-CM | POA: Diagnosis not present

## 2019-10-29 DIAGNOSIS — K224 Dyskinesia of esophagus: Secondary | ICD-10-CM | POA: Diagnosis present

## 2019-10-29 DIAGNOSIS — J9600 Acute respiratory failure, unspecified whether with hypoxia or hypercapnia: Secondary | ICD-10-CM | POA: Diagnosis present

## 2019-10-29 DIAGNOSIS — Z79899 Other long term (current) drug therapy: Secondary | ICD-10-CM | POA: Diagnosis not present

## 2019-10-29 DIAGNOSIS — R0902 Hypoxemia: Secondary | ICD-10-CM

## 2019-10-29 DIAGNOSIS — K3189 Other diseases of stomach and duodenum: Secondary | ICD-10-CM

## 2019-10-29 DIAGNOSIS — I10 Essential (primary) hypertension: Secondary | ICD-10-CM | POA: Diagnosis present

## 2019-10-29 DIAGNOSIS — Z7982 Long term (current) use of aspirin: Secondary | ICD-10-CM | POA: Diagnosis not present

## 2019-10-29 DIAGNOSIS — Z803 Family history of malignant neoplasm of breast: Secondary | ICD-10-CM

## 2019-10-29 DIAGNOSIS — Z8042 Family history of malignant neoplasm of prostate: Secondary | ICD-10-CM | POA: Diagnosis not present

## 2019-10-29 DIAGNOSIS — Z800 Family history of malignant neoplasm of digestive organs: Secondary | ICD-10-CM

## 2019-10-29 DIAGNOSIS — Z79890 Hormone replacement therapy (postmenopausal): Secondary | ICD-10-CM

## 2019-10-29 DIAGNOSIS — F329 Major depressive disorder, single episode, unspecified: Secondary | ICD-10-CM | POA: Diagnosis present

## 2019-10-29 DIAGNOSIS — Z8249 Family history of ischemic heart disease and other diseases of the circulatory system: Secondary | ICD-10-CM

## 2019-10-29 DIAGNOSIS — K219 Gastro-esophageal reflux disease without esophagitis: Secondary | ICD-10-CM | POA: Diagnosis present

## 2019-10-29 DIAGNOSIS — K222 Esophageal obstruction: Secondary | ICD-10-CM | POA: Diagnosis present

## 2019-10-29 DIAGNOSIS — F32A Depression, unspecified: Secondary | ICD-10-CM | POA: Diagnosis present

## 2019-10-29 HISTORY — DX: Esophageal obstruction: K22.2

## 2019-10-29 LAB — CBC
HCT: 39.7 % (ref 39.0–52.0)
Hemoglobin: 13.2 g/dL (ref 13.0–17.0)
MCH: 30.1 pg (ref 26.0–34.0)
MCHC: 33.2 g/dL (ref 30.0–36.0)
MCV: 90.4 fL (ref 80.0–100.0)
Platelets: 192 10*3/uL (ref 150–400)
RBC: 4.39 MIL/uL (ref 4.22–5.81)
RDW: 12.5 % (ref 11.5–15.5)
WBC: 9.8 10*3/uL (ref 4.0–10.5)
nRBC: 0 % (ref 0.0–0.2)

## 2019-10-29 LAB — BASIC METABOLIC PANEL
Anion gap: 7 (ref 5–15)
BUN: 14 mg/dL (ref 6–20)
CO2: 29 mmol/L (ref 22–32)
Calcium: 8.6 mg/dL — ABNORMAL LOW (ref 8.9–10.3)
Chloride: 102 mmol/L (ref 98–111)
Creatinine, Ser: 0.79 mg/dL (ref 0.61–1.24)
GFR calc Af Amer: >60 mL/min (ref >60)
GFR calc non Af Amer: >60 mL/min (ref >60)
Glucose, Bld: 139 mg/dL — ABNORMAL HIGH (ref 70–99)
Potassium: 3.8 mmol/L (ref 3.5–5.1)
Sodium: 138 mmol/L (ref 135–145)

## 2019-10-29 LAB — POC SARS CORONAVIRUS 2 AG -  ED: SARS Coronavirus 2 Ag: NEGATIVE

## 2019-10-29 LAB — BRAIN NATRIURETIC PEPTIDE: B Natriuretic Peptide: 29 pg/mL (ref 0.0–100.0)

## 2019-10-29 MED ORDER — ALBUTEROL SULFATE HFA 108 (90 BASE) MCG/ACT IN AERS: 2.0000 | INHALATION_SPRAY | Freq: Four times a day (QID) | RESPIRATORY_TRACT | Status: DC | PRN

## 2019-10-29 MED ORDER — SODIUM CHLORIDE 0.9 % IV SOLN
2.0000 g | Freq: Once | INTRAVENOUS | Status: AC
  Administered 2019-10-29: 2 g via INTRAVENOUS
  Filled 2019-10-29: qty 20

## 2019-10-29 MED ORDER — SODIUM CHLORIDE 0.9 % IV SOLN
500.0000 mg | INTRAVENOUS | Status: DC
  Administered 2019-10-29: 500 mg via INTRAVENOUS
  Filled 2019-10-29: qty 500

## 2019-10-29 MED ORDER — ACETAMINOPHEN 325 MG PO TABS: 650.0000 mg | ORAL_TABLET | Freq: Four times a day (QID) | ORAL | Status: DC | PRN

## 2019-10-29 MED ORDER — SODIUM CHLORIDE 0.9 % IV SOLN
3.0000 g | Freq: Three times a day (TID) | INTRAVENOUS | Status: DC
  Administered 2019-10-30: 3 g via INTRAVENOUS
  Filled 2019-10-29 (×6): qty 8

## 2019-10-29 MED ORDER — ACETAMINOPHEN 650 MG RE SUPP: 650.0000 mg | Freq: Four times a day (QID) | RECTAL | Status: DC | PRN

## 2019-10-29 MED ORDER — SODIUM CHLORIDE 0.9 % IV SOLN: INTRAVENOUS | Status: DC

## 2019-10-29 MED ORDER — ONDANSETRON HCL 4 MG PO TABS: 4.0000 mg | ORAL_TABLET | Freq: Four times a day (QID) | ORAL | Status: DC | PRN

## 2019-10-29 MED ORDER — ASPIRIN EC 81 MG PO TBEC
81.0000 mg | DELAYED_RELEASE_TABLET | Freq: Every day | ORAL | Status: DC
  Administered 2019-10-29: 81 mg via ORAL
  Filled 2019-10-29: qty 1

## 2019-10-29 MED ORDER — PANTOPRAZOLE SODIUM 40 MG PO TBEC
40.0000 mg | DELAYED_RELEASE_TABLET | Freq: Two times a day (BID) | ORAL | Status: DC
  Administered 2019-10-29: 40 mg via ORAL
  Filled 2019-10-29 (×2): qty 1

## 2019-10-29 MED ORDER — ACETAMINOPHEN 325 MG PO TABS
650.0000 mg | ORAL_TABLET | Freq: Once | ORAL | Status: AC
  Administered 2019-10-29: 16:00:00 650 mg via ORAL

## 2019-10-29 MED ORDER — ALPRAZOLAM 1 MG PO TABS
1.0000 mg | ORAL_TABLET | Freq: Three times a day (TID) | ORAL | Status: DC | PRN
  Administered 2019-10-29: 1 mg via ORAL
  Filled 2019-10-29: qty 1

## 2019-10-29 MED ORDER — POLYETHYLENE GLYCOL 3350 17 G PO PACK: 17.0000 g | PACK | Freq: Every day | ORAL | Status: DC | PRN

## 2019-10-29 MED ORDER — LEVOTHYROXINE SODIUM 25 MCG PO TABS
25.0000 ug | ORAL_TABLET | Freq: Every day | ORAL | Status: DC
  Administered 2019-10-30: 25 ug via ORAL
  Filled 2019-10-29: qty 1

## 2019-10-29 MED ORDER — GUAIFENESIN-DM 100-10 MG/5ML PO SYRP: 10.0000 mL | ORAL_SOLUTION | Freq: Four times a day (QID) | ORAL | Status: DC | PRN

## 2019-10-29 MED ORDER — ENOXAPARIN SODIUM 40 MG/0.4ML ~~LOC~~ SOLN
40.0000 mg | Freq: Every day | SUBCUTANEOUS | Status: DC
  Administered 2019-10-29: 40 mg via SUBCUTANEOUS
  Filled 2019-10-29: qty 0.4

## 2019-10-29 MED ORDER — ONDANSETRON HCL 4 MG/2ML IJ SOLN: 4.0000 mg | Freq: Four times a day (QID) | INTRAMUSCULAR | Status: DC | PRN

## 2019-10-29 NOTE — ED Notes (Signed)
Pt spoke with hospitalist about eating. Dr. Mariea Clonts gave permission for wife to bring food from outside for pt to eat. Pt called wife to inform wife of order.

## 2019-10-29 NOTE — ED Notes (Addendum)
Pt and family member informed of need to be transported to ED via ambulance for oxygen therapy.  Pt  states he will go to ED, but declines ambulance transport.

## 2019-10-29 NOTE — ED Notes (Signed)
Pt declined o2 at this time. States he does not feel overtly sob.

## 2019-10-29 NOTE — ED Triage Notes (Addendum)
C/O nasal congestion, fevers up to 100.6, "rattly chest" over past few days. States has been slightly more SOB than usual.  Has been taking Mucinex.  Denies any meds in past 4 hrs.

## 2019-10-29 NOTE — ED Notes (Signed)
Pt placed on 2L [place] due to sats 94%. Pt was resistant to this and states he may not keep it on.

## 2019-10-29 NOTE — H&P (Signed)
History and Physical    Christian Ellison LFY:[id number] DOB: 1961-04-24 DOA: 10/29/2019  PCP: Christian Coop, FNP   Patient coming from: Home  I have personally briefly reviewed patient's old medical records in [facility]  Chief Complaint: Low oxygen levels, chest congestion  HPI: Christian Ellison is a 59 y.o. male with medical history significant for recurrent aspiration pneumonia, esophageal stricture, substance abuse, depression, CHF. Patient presented to the urgent care today, and subsequently sent to the ED with complaints of nasal congestion and fevers up to 100.6.  He tells me his O2 sats went down to the 80s at home.  He denies cough or difficulty breathing to me.  He reports chronic difficulties involving his esophageal stricture the past been dilated several times without significant improvement.  He reports he eats a normal diet, but has to sit upright for when eating and up to 2 hours after.  He denies vomiting, no loose stools, no abdominal pain no pain with urination, reports headaches without next stiffness, pain or change in vision.  At the [facility] urgent care-temperature recorded at 101.6, tachycardic to 109.  O2 sats down to 83% on room air.  Patient tells me he was given Tylenol.  And sent to the emergency department.  ED Course:  T max 99, heart rate 60s to 90s, blood pressure systolic 106 - 118, O2 sats down to 86% on room air, improved with 2 L nasal cannula.  WBC 9.8.  Chest x-ray-no significant interval change, subtle bilateral somewhat nodular opacities but appreciated by CT, generally consistent with atypical infection.  No new airspace opacity. POC test negative. Patient was started on ceftriaxone and azithromycin.  Hospitalist to admit for further evaluation and management.  Review of Systems: As per HPI all other systems reviewed and negative.  Past Medical History:  Diagnosis Date  . Anxiety   . Aspiration pneumonia ([facility]) 05/26/2018   RLL    . Chronic pain   . Esophageal stricture   . GERD (gastroesophageal reflux disease)   . 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   Christian Ellison Opiate abuse, continuous ([facility])     Past Surgical History:  Procedure Laterality Date  . BIOPSY  03/18/2018   Procedure: BIOPSY;  Surgeon: Christian Hippo, MD;  [facility];  Service: Endoscopy;;  gastric   . COLONOSCOPY N/A 11/22/2013   Dr. Karilyn Ellison: Normal except for hemorrhoids.  Next colonoscopy 10 years.  . ESOPHAGEAL DILATION N/A 03/18/2018   Procedure: ESOPHAGEAL DILATION;  Surgeon: Christian Hippo, MD;  [facility];  Service: Endoscopy;  Laterality: N/A;  . ESOPHAGEAL MANOMETRY N/A 06/15/2018   Procedure: ESOPHAGEAL MANOMETRY (EM);  Surgeon: Christian Form, MD;  [facility];  Service: Endoscopy;  Laterality: N/A;  . ESOPHAGOGASTRODUODENOSCOPY (EGD) WITH PROPOFOL N/A 03/18/2018   Dr. Karilyn Ellison: Benign-appearing mild esophageal stenosis proximal to the GE J, status post dilation.  2 cm hiatal hernia.  Gastritis, biopsies negative for H. pylori  . Fracture Right Leg     Patient has rod/screws in this leg  . Rotor Cuff  2012   Right Shoulder     reports that he has never smoked. He has never used smokeless tobacco. He reports previous alcohol use. He reports previous drug use. Drug: Benzodiazepines.  No Known Allergies  Family History  Problem Relation Age of Onset  . Breast cancer Mother   . Colon cancer Father   . Bladder Cancer Father   .  Prostate cancer Father   . Hypertension Sister   . Hypothyroidism Sister   . Healthy Sister   . Healthy Daughter   . Healthy Son   . Healthy Son     Prior to Admission medications   Medication Sig Start Date End Date Taking? Authorizing Provider  acetaminophen (TYLENOL) 325 MG tablet Take 2 tablets (650 mg total) by mouth every 6 (six) hours as needed for mild pain (or Fever >/= 101). 05/17/19  Yes Brittania Sudbeck, Cour[place], MD  albuterol (VENTOLIN HFA) 108 (90 Base)  MCG/ACT inhaler Inhale 2 puffs into the lungs every 6 (six) hours as needed for wheezing or shortness of breath (cough). 05/17/19  Yes Labrandon Knoch, Courage, MD  ALPRAZolam Prudy Feeler) 1 MG tablet Take 1 mg by mouth 3 (three) times daily as needed for anxiety.  10/22/19  Yes [provider]  aspirin EC 81 MG tablet Take 1 tablet (81 mg total) by mouth daily with breakfast. Patient taking differently: Take 81 mg by mouth at bedtime.  03/13/19  Yes Joh Rao, Courage, MD  citalopram (CELEXA) 40 MG tablet Take 40 mg by mouth daily as needed (for mood).  10/23/19  Yes [provider]  diltiazem (CARDIZEM CD) 120 MG 24 hr capsule Take 1 capsule (120 mg total) by mouth daily. 05/17/19 05/16/20 Yes Keldan Eplin, Courage, MD  Glucosamine HCl (GLUCOSAMINE PO) Take 1 tablet by mouth at bedtime.    Yes [provider]  levothyroxine (SYNTHROID, LEVOTHROID) 25 MCG tablet Take 25 mcg by mouth daily. 05/13/18  Yes [provider]  Multiple Vitamin (MULTIVITAMIN WITH MINERALS) TABS tablet Take 1 tablet by mouth daily. 50+   Yes [provider]  pantoprazole (PROTONIX) 40 MG tablet Take 1 tablet (40 mg total) by mouth 2 (two) times daily. 05/17/19 05/16/20 Yes Christian Hale, MD    Physical Exam: Vitals:   10/29/19 1900 10/29/19 1930 10/29/19 1945 10/29/19 2000  BP: 110/76 106/74  (!) 97/50  Pulse: 78 80 69 73  Resp: 14 13 10  (!) 9  Temp:      TempSrc:      SpO2: 93% 93% 93% 93%  Weight:      Height:        Constitutional: NAD, calm, comfortable Vitals:   10/29/19 1900 10/29/19 1930 10/29/19 1945 10/29/19 2000  BP: 110/76 106/74  (!) 97/50  Pulse: 78 80 69 73  Resp: 14 13 10  (!) 9  Temp:      TempSrc:      SpO2: 93% 93% 93% 93%  Weight:      Height:       Eyes: PERRL, lids and conjunctivae normal ENMT: Mucous membranes are moist.  Neck: normal, supple, no masses, no thyromegaly Respiratory: clear to auscultation bilaterally, no wheezing, no crackles. Normal respiratory  effort. No accessory muscle use.  Cardiovascular: Regular rate and rhythm, no murmurs / rubs / gallops. No extremity edema. 2+ pedal pulses. Abdomen: no tenderness, no masses palpated. No hepatosplenomegaly. Bowel sounds positive.  Musculoskeletal: no clubbing / cyanosis. No joint deformity upper and lower extremities. Good ROM, no contractures. Normal muscle tone.  Skin: no rashes, lesions, ulcers. No induration Neurologic: No apparent cranial nerve abnormality, moving all extremities spontaneously. Psychiatric: Normal judgment and insight. Alert and oriented x 3. Normal mood.   Labs on Admission: I have personally reviewed following labs and imaging studies  CBC: Recent Labs  Lab 10/29/19 1736  WBC 9.8  HGB 13.2  HCT 39.7  MCV 90.4  PLT 192  Basic Metabolic Panel: Recent Labs  Lab 10/29/19 1736  NA 138  K 3.8  CL 102  CO2 29  GLUCOSE 139*  BUN 14  CREATININE 0.79  CALCIUM 8.6*    Radiological Exams on Admission: DG Chest Port 1 View  Result Date: 10/29/2019 CLINICAL DATA:  Shortness of breath EXAM: PORTABLE CHEST 1 VIEW COMPARISON:  06/23/2019, CT chest, 06/10/2019 FINDINGS: The heart size and mediastinal contours are within normal limits. Subtle bilateral, somewhat nodular opacities are not significantly changed compared to prior examination and better appreciated by CT. The visualized skeletal structures are unremarkable. IMPRESSION: No significant interval change. Subtle bilateral somewhat nodular opacities better appreciated by CT, and generally consistent with atypical infection. No new airspace opacity. Electronically Signed   By: Eddie Candle M.D.   On: 10/29/2019 17:49    EKG: Independently reviewed.  Sinus rhythm rate 94, QTc 429.  No significant ST or T wave abnormalities compared to prior.  Assessment/Plan Principal Problem:   Acute respiratory failure with hypoxia (HCC) Active Problems:   Depression   Esophageal stricture/stenosis/esophageal dysmotility  with recurrent aspiration   Hypothyroidism   Acute hypoxic respiratory failure- O2 sats down to 83% requiring 2- 3 L .  Likely infectious etiology considering fever of 101.6 at urgent care.  At this time he rules out for sepsis.  Point-of-care Covid test negative.  Port Chest x-ray without significant interval change, suggesting atypical infection.  Differentials-recurrent aspiration pneumonia given ongoing problems with his esophageal stricture.   - IV ceftriaxone and azithromycin started in ED, will continue with IV azithromycin for atypicals and IV Unasyn for aspiration pneumonia coverage -Obtain blood cultures -Obtain UA and urine cultures -CBC, BMP a.m. - repeat checks x-ray in a.m. 2 view -Follow-up respiratory virus panel -Aspiration precautions - IVF N/s 100cc/hr x 15 hrs -Mucolytics as needed, albuterol inhaler as needed  Esophageal stricture with recurrent aspiration pneumonia/pneumonitis-follows with Dr. Laural Golden -Pending clinical course consider GI consult as inpatient versus following up as outpatient. - resume home protonix 40mg  Q12h - Soft diet for now  Hypertension -blood pressure soft.  -Hold Cardizem 120 mg daily for now - IVF  Depression- -resume home Xanax 1 mg 3 times daily as needed  Hypothyroidism -Resume home Synthroid   DVT prophylaxis: Lovenox Code Status: Full code Family Communication: None at bedside Disposition Plan: ~ 2 days Consults called: None Admission status: Inpt ,tele  I certify that at the point of admission it is my clinical judgment that the patient will require inpatient hospital care spanning beyond 2 midnights from the point of admission due to high intensity of service, high risk for further deterioration and high frequency of surveillance required. The following factors support the patient status of inpatient: New acute respiratory failure possible pneumonia requiring IV antibiotics, pending blood culture results.   Bethena Roys MD [facility]  10/29/2019, 8:57 PM

## 2019-10-29 NOTE — ED Provider Notes (Signed)
[facility]    CSN: [id number] Arrival date & time: 10/29/19  1512      History   Chief Complaint Chief Complaint  Patient presents with  . Fever  . Nasal Congestion    HPI Christian Ellison is a 59 y.o. male.   Who presented to the urgent care with a complaint of nasal congestion, shortness of breath and fever for the past few days.  Denies sick exposure to COVID, flu or strep.  Denies recent travel.  Denies aggravating or alleviating symptoms.  Has been taking Mucinex without relief.  Denies previous COVID infection.   Denies , fatigue, rhinorrhea, sore throat, cough,  wheezing, chest pain, nausea, vomiting, changes in bowel or bladder habits.    The history is provided by the patient. No language interpreter was used.  Fever Associated symptoms: chills and congestion     Past Medical History:  Diagnosis Date  . Anxiety   . Aspiration pneumonia ([place]) 05/26/2018   RLL  . Chronic pain   . Esophageal stricture   . GERD (gastroesophageal reflux disease)   . 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   Marland Kitchen Opiate abuse, continuous [facility])     Patient Active Problem List   Diagnosis Date Noted  . Pneumonia 06/24/2019  . Pneumonitis 06/24/2019  . Aspiration pneumonitis ([place]) 06/10/2019  . Closed fracture of xiphoid process   . PNA (pneumonia) 05/15/2019  . Multifocal pneumonia 03/11/2019  . Acute respiratory failure with hypoxemia ([place]) 03/11/2019  . Sinus tachycardia 03/11/2019  . Sepsis due to pneumonia ([place]) 03/11/2019  . Hypothyroidism 03/11/2019  . Bronchopneumonia 11/06/2018  . Acute Respiratory failure with hypoxia (2/2 Asp PNA) 11/06/2018  . Recurrent aspiration pneumonia ([place])   . Fever 06/09/2018  . Tachycardia 06/09/2018  . Hypoxia 06/09/2018  . Hypotension 06/09/2018  . Esophageal motility disorder   . Aspiration pneumonia of right lower lobe ([place])   . Sepsis ([place]) 05/26/2018  . Pulmonary edema 02/02/2018  . HCAP  (healthcare-associated pneumonia) 02/02/2018  . Weakness generalized 02/02/2018  . Hypertension 02/02/2018  . CHF (congestive heart failure) ([place]) 02/02/2018  . Nausea & vomiting 02/02/2018  . Dysphagia 02/02/2018  . Esophageal stricture/stenosis/esophageal dysmotility with recurrent aspiration 02/02/2018  . Esophageal stenosis 02/02/2018  . GERD (gastroesophageal reflux disease) 02/02/2018  . Polysubstance abuse ([place]) 02/02/2018  . Acute respiratory failure with hypoxia ([place])   . Transaminitis   . Elevated troponin   . Overdose 01/02/2018  . Hx of hepatitis C 01/17/2013  . Depression 01/17/2013  . Anxiety with depression 01/17/2013  . Right knee pain 01/17/2013    Past Surgical History:  Procedure Laterality Date  . BIOPSY  03/18/2018   Procedure: BIOPSY;  Surgeon: Rogene Houston, MD;  [facility];  Service: Endoscopy;;  gastric   . COLONOSCOPY N/A 11/22/2013   Dr. Laural Golden: Normal except for hemorrhoids.  Next colonoscopy 10 years.  . ESOPHAGEAL DILATION N/A 03/18/2018   Procedure: ESOPHAGEAL DILATION;  Surgeon: Rogene Houston, MD;  Location: [facility];  Service: Endoscopy;  Laterality: N/A;  . ESOPHAGEAL MANOMETRY N/A 06/15/2018   Procedure: ESOPHAGEAL MANOMETRY (EM);  Surgeon: Mauri Pole, MD;  Location: [facility];  Service: Endoscopy;  Laterality: N/A;  . ESOPHAGOGASTRODUODENOSCOPY (EGD) WITH PROPOFOL N/A 03/18/2018   Dr. Laural Golden: Benign-appearing mild esophageal stenosis proximal to the GE J, status post dilation.  2 cm hiatal hernia.  Gastritis, biopsies negative for H. pylori  . Fracture Right Leg  Patient has rod/screws in this leg  . Rotor Cuff  2012   Right Shoulder       Home Medications    Prior to Admission medications   Medication Sig Start Date End Date Taking? Authorizing Provider  albuterol (VENTOLIN HFA) 108 (90 Base) MCG/ACT inhaler Inhale 2 puffs into the lungs every 6 (six) hours as needed for wheezing or shortness of breath  (cough). 05/17/19  Yes Emokpae, Courage, MD  ALPRAZolam Prudy Feeler) 0.5 MG tablet Take 0.5 mg by mouth 2 (two) times daily as needed.    Yes [provider]  aspirin EC 81 MG tablet Take 1 tablet (81 mg total) by mouth daily with breakfast. 03/13/19  Yes Emokpae, Courage, MD  diltiazem (CARDIZEM CD) 120 MG 24 hr capsule Take 1 capsule (120 mg total) by mouth daily. 05/17/19 05/16/20 Yes Emokpae, Courage, MD  Glucosamine HCl (GLUCOSAMINE PO) Take by mouth.   Yes [provider]  levothyroxine (SYNTHROID, LEVOTHROID) 25 MCG tablet Take 25 mcg by mouth daily. 05/13/18  Yes [provider]  Multiple Vitamin (MULTIVITAMIN WITH MINERALS) TABS tablet Take 1 tablet by mouth daily. 50+   Yes [provider]  pantoprazole (PROTONIX) 40 MG tablet Take 1 tablet (40 mg total) by mouth 2 (two) times daily. 05/17/19 05/16/20 Yes Emokpae, Courage, MD  acetaminophen (TYLENOL) 325 MG tablet Take 2 tablets (650 mg total) by mouth every 6 (six) hours as needed for mild pain (or Fever >/= 101). 05/17/19   Shon Hale, MD    Family History Family History  Problem Relation Age of Onset  . Breast cancer Mother   . Colon cancer Father   . Bladder Cancer Father   . Prostate cancer Father   . Hypertension Sister   . Hypothyroidism Sister   . Healthy Sister   . Healthy Daughter   . Healthy Son   . Healthy Son     Social History Social History   Tobacco Use  . Smoking status: Never Smoker  . Smokeless tobacco: Never Used  Substance Use Topics  . Alcohol use: Not Currently  . Drug use: Not Currently    Types: Benzodiazepines    Comment: opiates     Allergies   Patient has no known allergies.   Review of Systems Review of Systems  Constitutional: Positive for chills and fever.  HENT: Positive for congestion.   Respiratory: Positive for shortness of breath.   Cardiovascular: Negative.   Gastrointestinal: Negative.   Neurological: Negative.      Physical Exam  Triage Vital Signs ED Triage Vitals  Enc Vitals Group     BP 10/29/19 1524 (!) 143/85     Pulse Rate 10/29/19 1524 (!) 109     Resp 10/29/19 1524 20     Temp 10/29/19 1524 (!) 101.6 F (38.7 C)     Temp Source 10/29/19 1524 Oral     SpO2 10/29/19 1524 (!) 89 %     Weight --      Height --      Head Circumference --      Peak Flow --      Pain Score 10/29/19 1527 0     Pain Loc --      Pain Edu? --      Excl. in GC? --    No data found.  Updated Vital Signs BP (!) 143/85   Pulse (!) 109   Temp (!) 101.6 F (38.7 C) (Oral)   Resp (!) 22  SpO2 91%   Visual Acuity Right Eye Distance:   Left Eye Distance:   Bilateral Distance:    Right Eye Near:   Left Eye Near:    Bilateral Near:     Physical Exam Vitals and nursing note reviewed.  Constitutional:      General: He is not in acute distress.    Appearance: Normal appearance. He is normal weight. He is not ill-appearing or toxic-appearing.  HENT:     Head: Normocephalic.     Right Ear: Tympanic membrane, ear canal and external ear normal. There is no impacted cerumen.     Left Ear: Tympanic membrane, ear canal and external ear normal. There is no impacted cerumen.     Nose: Nose normal. No congestion.     Mouth/Throat:     Mouth: Mucous membranes are moist.     Pharynx: Oropharynx is clear. No oropharyngeal exudate or posterior oropharyngeal erythema.  Cardiovascular:     Rate and Rhythm: Regular rhythm. Tachycardia present.     Pulses: Normal pulses.     Heart sounds: Normal heart sounds. No murmur.  Pulmonary:     Effort: Pulmonary effort is normal. No respiratory distress.     Breath sounds: Normal breath sounds. No wheezing or rhonchi.  Skin:    Capillary Refill: Capillary refill takes less than 2 seconds.  Neurological:     Mental Status: He is alert and oriented to person, place, and time.      UC Treatments / Results  Labs (all labs ordered are listed, but only abnormal results are displayed)  Labs Reviewed  POC SARS CORONAVIRUS 2 AG -  ED    EKG   Radiology No results found.  Procedures Procedures (including critical care time)  Medications Ordered in UC Medications  acetaminophen (TYLENOL) tablet 650 mg (650 mg Oral Given 10/29/19 1543)    Initial Impression / Assessment and Plan / UC Course  I have reviewed the triage vital signs and the nursing notes.  Pertinent labs & imaging results that were available during my care of the patient were reviewed by me and considered in my medical decision making (see chart for details).     POCT COVID-19 test was negative On presentation patient oxygen saturation on room air was 83%.  With 3 L oxygen O2 sat was 92%.  Due to patient history  he was advised to go to ED for further evaluation.  Patient was offered the opportunity to go to the ED by ambulance.  Patient declined.  Daughter that was with the patient at the office will take patient to the ED.  Final Clinical Impressions(s) / UC Diagnoses   Final diagnoses:  Hypoxia  Fever and chills     Discharge Instructions     POCT COVID-19 test was negative Patient was advised to go to ED for further evaluation    ED Prescriptions    None     PDMP not reviewed this encounter.   Durward Parcel, FNP 10/29/19 1607

## 2019-10-29 NOTE — ED Triage Notes (Signed)
Patient states his has been hypoxic at home with oxygen saturations in the 80's. Patient is 43 in triage.

## 2019-10-29 NOTE — Discharge Instructions (Signed)
POCT COVID-19 test was negative Patient was advised to go to ED for further evaluation

## 2019-10-29 NOTE — ED Notes (Signed)
Lab called and advised that pt's covid test is negative but they are having problems with the results crossing over. Consulting civil engineer on 300 notified of results

## 2019-10-30 ENCOUNTER — Inpatient Hospital Stay (HOSPITAL_COMMUNITY): Payer: 59

## 2019-10-30 LAB — BASIC METABOLIC PANEL
Anion gap: 7 (ref 5–15)
BUN: 16 mg/dL (ref 6–20)
CO2: 31 mmol/L (ref 22–32)
Calcium: 8.1 mg/dL — ABNORMAL LOW (ref 8.9–10.3)
Chloride: 103 mmol/L (ref 98–111)
Creatinine, Ser: 0.74 mg/dL (ref 0.61–1.24)
GFR calc Af Amer: >60 mL/min (ref >60)
GFR calc non Af Amer: >60 mL/min (ref >60)
Glucose, Bld: 111 mg/dL — ABNORMAL HIGH (ref 70–99)
Potassium: 3.7 mmol/L (ref 3.5–5.1)
Sodium: 141 mmol/L (ref 135–145)

## 2019-10-30 LAB — CBC
HCT: 36.8 % — ABNORMAL LOW (ref 39.0–52.0)
Hemoglobin: 11.8 g/dL — ABNORMAL LOW (ref 13.0–17.0)
MCH: 30.1 pg (ref 26.0–34.0)
MCHC: 32.1 g/dL (ref 30.0–36.0)
MCV: 93.9 fL (ref 80.0–100.0)
Platelets: 150 10*3/uL (ref 150–400)
RBC: 3.92 MIL/uL — ABNORMAL LOW (ref 4.22–5.81)
RDW: 12.8 % (ref 11.5–15.5)
WBC: 4.7 10*3/uL (ref 4.0–10.5)
nRBC: 0 % (ref 0.0–0.2)

## 2019-10-30 LAB — RESPIRATORY PANEL BY PCR

## 2019-10-30 LAB — FERRITIN: Ferritin: 36 ng/mL (ref 24–336)

## 2019-10-30 LAB — PROCALCITONIN: Procalcitonin: 0.25 ng/mL

## 2019-10-30 LAB — C-REACTIVE PROTEIN: CRP: 6.9 mg/dL — ABNORMAL HIGH (ref <1.0)

## 2019-10-30 LAB — SEDIMENTATION RATE: Sed Rate: 18 mm/hr — ABNORMAL HIGH (ref 0–16)

## 2019-10-30 MED ORDER — AMOXICILLIN-POT CLAVULANATE 500-125 MG PO TABS: 1.0000 | ORAL_TABLET | Freq: Two times a day (BID) | ORAL | 0 refills | Status: AC

## 2019-10-30 MED ORDER — GUAIFENESIN-DM 100-10 MG/5ML PO SYRP: 10.0000 mL | ORAL_SOLUTION | Freq: Four times a day (QID) | ORAL | 0 refills | Status: DC | PRN

## 2019-10-30 NOTE — Progress Notes (Signed)
Pharmacy Antibiotic Note  Christian Ellison is a 59 y.o. male admitted on 10/29/2019 with pneumonia.  Pharmacy has been consulted for Unasyn dosing for aspiration PNA coverage. WBC WNL. Renal function good.   Plan: Unasyn 3g IV q8h Azithromycin per MD for atypical coverage Trend WBC, temp, renal function  F/U infectious work-up  Height: 6' (182.9 cm) Weight: 185 lb (83.9 kg) IBW/kg (Calculated) : 77.6  Temp (24hrs), Avg:99.8 F (37.7 C), Min:98.9 F (37.2 C), Max:101.6 F (38.7 C)  Recent Labs  Lab 10/29/19 1736  WBC 9.8  CREATININE 0.79    Estimated Creatinine Clearance: 111.8 mL/min (by C-G formula based on SCr of 0.79 mg/dL).    No Known Allergies  Abran Duke, PharmD, BCPS Clinical Pharmacist Phone: [phone]

## 2019-10-30 NOTE — Plan of Care (Signed)
  Problem: Education: Goal: Knowledge of General Education information will improve Description: Including pain rating scale, medication(s)/side effects and non-pharmacologic comfort measures Outcome: Progressing   Problem: Clinical Measurements: Goal: Will remain free from infection Outcome: Progressing   Problem: Clinical Measurements: Goal: Respiratory complications will improve Outcome: Progressing   Problem: Clinical Measurements: Goal: Cardiovascular complication will be avoided Outcome: Progressing   Problem: Activity: Goal: Risk for activity intolerance will decrease Outcome: Progressing   Problem: Elimination: Goal: Will not experience complications related to urinary retention Outcome: Progressing   Problem: Elimination: Goal: Will not experience complications related to bowel motility Outcome: Progressing   Problem: Pain Managment: Goal: General experience of comfort will improve Outcome: Progressing   Problem: Safety: Goal: Ability to remain free from injury will improve Outcome: Progressing   Problem: Skin Integrity: Goal: Risk for impaired skin integrity will decrease Outcome: Progressing

## 2019-10-30 NOTE — Plan of Care (Signed)
  Problem: Education: Goal: Knowledge of General Education information will improve Description: Including pain rating scale, medication(s)/side effects and non-pharmacologic comfort measures Outcome: Adequate for Discharge   Problem: Health Behavior/Discharge Planning: Goal: Ability to manage health-related needs will improve Outcome: Adequate for Discharge   Problem: Clinical Measurements: Goal: Ability to maintain clinical measurements within normal limits will improve Outcome: Adequate for Discharge Goal: Will remain free from infection Outcome: Adequate for Discharge Goal: Respiratory complications will improve Outcome: Adequate for Discharge Goal: Cardiovascular complication will be avoided Outcome: Adequate for Discharge   Problem: Activity: Goal: Risk for activity intolerance will decrease Outcome: Adequate for Discharge   Problem: Coping: Goal: Level of anxiety will decrease Outcome: Adequate for Discharge   Problem: Elimination: Goal: Will not experience complications related to bowel motility Outcome: Adequate for Discharge Goal: Will not experience complications related to urinary retention Outcome: Adequate for Discharge   Problem: Pain Managment: Goal: General experience of comfort will improve Outcome: Adequate for Discharge   Problem: Safety: Goal: Ability to remain free from injury will improve Outcome: Adequate for Discharge   Problem: Skin Integrity: Goal: Risk for impaired skin integrity will decrease Outcome: Adequate for Discharge   

## 2019-10-30 NOTE — Discharge Summary (Signed)
Physician Discharge Summary  [name] HTD:[id number] DOB: March 18, 1961 DOA: 10/29/2019  PCP: Jason Coop, FNP  Admit date: 10/29/2019  Discharge date: 10/30/2019  Admitted From:Home  Disposition:  Home  Recommendations for Outpatient Follow-up:  1. Follow up with PCP in 1-2 weeks 2. Continue on Augmentin as prescribed for 6 more days for coverage of possible aspiration pneumonia.  Total 7-day course of treatment prescribed 3. Continue home breathing treatments as needed for shortness of breath or wheezing  Home Health: None  Equipment/Devices: None  Discharge Condition: Stable  CODE STATUS: Full  Diet recommendation: Heart Healthy  Brief/Interim Summary: Per HPI: Christian Ellison is a 59 y.o. male with medical history significant for recurrent aspiration pneumonia, esophageal stricture, substance abuse, depression, CHF. Patient presented to the urgent care today, and subsequently sent to the ED with complaints of nasal congestion and fevers up to 100.6.  He tells me his O2 sats went down to the 80s at home.  He denies cough or difficulty breathing to me.  He reports chronic difficulties involving his esophageal stricture the past been dilated several times without significant improvement.  He reports he eats a normal diet, but has to sit upright for when eating and up to 2 hours after.  He denies vomiting, no loose stools, no abdominal pain no pain with urination, reports headaches without next stiffness, pain or change in vision.  At the [facility] urgent care-temperature recorded at 101.6, tachycardic to 109.  O2 sats down to 83% on room air.  Patient tells me he was given Tylenol.  And sent to the emergency department.  Patient was admitted with acute hypoxemic respiratory failure suspected to be related to possible aspiration pneumonia or atypical infection.  He states that he is feeling much better and he has remained afebrile.  No significant hypoxemia noted  and patient had refused to wear oxygen during his inpatient stay.  He remains on room air and has worn his CPAP at night.  He has been ambulated with no significant respiratory distress and did not have any demonstrated hypoxemia.  He appears stable for discharge and will remain on Augmentin as prescribed for 6 more days to complete a total 7-day course of treatment for possible aspiration pneumonia.  His respiratory virus panel is currently pending and Covid testing has been negative.  No other acute events noted throughout his brief inpatient stay.  Discharge Diagnoses:  Principal Problem:   Acute respiratory failure with hypoxia (HCC) Active Problems:   Depression   Esophageal stricture/stenosis/esophageal dysmotility with recurrent aspiration   Hypothyroidism  Principal discharge diagnosis: Acute hypoxemic respiratory failure likely secondary to aspiration versus atypical pneumonia.  Discharge Instructions  Discharge Instructions    Diet - low sodium heart healthy   Complete by: As directed    Increase activity slowly   Complete by: As directed      Allergies as of 10/30/2019   No Known Allergies     Medication List    TAKE these medications   acetaminophen 325 MG tablet Commonly known as: TYLENOL Take 2 tablets (650 mg total) by mouth every 6 (six) hours as needed for mild pain (or Fever >/= 101).   albuterol 108 (90 Base) MCG/ACT inhaler Commonly known as: VENTOLIN HFA Inhale 2 puffs into the lungs every 6 (six) hours as needed for wheezing or shortness of breath (cough).   ALPRAZolam 1 MG tablet Commonly known as: XANAX Take 1 mg by mouth 3 (three) times daily as needed  for anxiety.   amoxicillin-clavulanate 500-125 MG tablet Commonly known as: Augmentin Take 1 tablet (500 mg total) by mouth 2 (two) times daily for 6 days.   aspirin EC 81 MG tablet Take 1 tablet (81 mg total) by mouth daily with breakfast. What changed: when to take this   citalopram 40 MG  tablet Commonly known as: CELEXA Take 40 mg by mouth daily as needed (for mood).   diltiazem 120 MG 24 hr capsule Commonly known as: Cardizem CD Take 1 capsule (120 mg total) by mouth daily.   GLUCOSAMINE PO Take 1 tablet by mouth at bedtime.   guaiFENesin-dextromethorphan 100-10 MG/5ML syrup Commonly known as: ROBITUSSIN DM Take 10 mLs by mouth every 6 (six) hours as needed for cough.   levothyroxine 25 MCG tablet Commonly known as: SYNTHROID Take 25 mcg by mouth daily.   multivitamin with minerals Tabs tablet Take 1 tablet by mouth daily. 50+   pantoprazole 40 MG tablet Commonly known as: Protonix Take 1 tablet (40 mg total) by mouth 2 (two) times daily.      Follow-up Information    Drue Second IV, FNP Follow up in 1 week(s).   Specialty: Family Medicine Contact information: [place] [facility] [address] [place] [address] [phone]          No Known Allergies  Consultations:  None   Procedures/Studies: DG Chest Port 1 View  Result Date: 10/29/2019 CLINICAL DATA:  Shortness of breath EXAM: PORTABLE CHEST 1 VIEW COMPARISON:  06/23/2019, CT chest, 06/10/2019 FINDINGS: The heart size and mediastinal contours are within normal limits. Subtle bilateral, somewhat nodular opacities are not significantly changed compared to prior examination and better appreciated by CT. The visualized skeletal structures are unremarkable. IMPRESSION: No significant interval change. Subtle bilateral somewhat nodular opacities better appreciated by CT, and generally consistent with atypical infection. No new airspace opacity. Electronically Signed   By: Eddie Candle M.D.   On: 10/29/2019 17:49   Discharge Exam: Vitals:   10/30/19 0522 10/30/19 0809  BP: 99/66   Pulse: 64   Resp: 17   Temp: (!) 97.3 F (36.3 C)   SpO2: 96% 97%   Vitals:   10/30/19 0035 10/30/19 0046 10/30/19 0522 10/30/19 0809  BP:   99/66   Pulse:  85 64    Resp:  16 17   Temp:   (!) 97.3 F (36.3 C)   TempSrc:   Oral   SpO2:  93% 96% 97%  Weight: 85.3 kg     Height: 6' (1.829 m)       General: Pt is alert, awake, not in acute distress Cardiovascular: RRR, S1/S2 +, no rubs, no gallops Respiratory: CTA bilaterally, no wheezing, no rhonchi Abdominal: Soft, NT, ND, bowel sounds + Extremities: no edema, no cyanosis    The results of significant diagnostics from this hospitalization (including imaging, microbiology, ancillary and laboratory) are listed below for reference.     Microbiology: Recent Results (from the past 240 hour(s))  Culture, blood (routine x 2)     Status: None (Preliminary result)   Collection Time: 10/29/19  8:37 PM   Specimen: Left Antecubital; Blood  Result Value Ref Range Status   Specimen Description LEFT ANTECUBITAL  Final   Special Requests   Final    BOTTLES DRAWN AEROBIC AND ANAEROBIC Blood Culture adequate volume   Culture   Final    NO GROWTH < 12 HOURS Performed at [facility], [address], [place], [place] [address]    Report Status PENDING  Incomplete  Culture, blood (routine x 2)     Status: None (Preliminary result)   Collection Time: 10/29/19  8:41 PM   Specimen: BLOOD LEFT HAND  Result Value Ref Range Status   Specimen Description BLOOD LEFT HAND  Final   Special Requests   Final    BOTTLES DRAWN AEROBIC AND ANAEROBIC Blood Culture adequate volume   Culture   Final    NO GROWTH < 12 HOURS Performed at [facility], [address] [place], [place] [address]    Report Status PENDING  Incomplete     Labs: BNP (last 3 results) Recent Labs    05/15/19 1143 06/23/19 2250 10/29/19 1736  BNP 49.0 47.0 29.0   Basic Metabolic Panel: Recent Labs  Lab 10/29/19 1736 10/30/19 0600  NA 138 141  K 3.8 3.7  CL 102 103  CO2 29 31  GLUCOSE 139* 111*  BUN 14 16  CREATININE 0.79 0.74  CALCIUM 8.6* 8.1*   Liver Function Tests: No results for input(s): AST, ALT, ALKPHOS, BILITOT,  PROT, ALBUMIN in the last 168 hours. No results for input(s): LIPASE, AMYLASE in the last 168 hours. No results for input(s): AMMONIA in the last 168 hours. CBC: Recent Labs  Lab 10/29/19 1736 10/30/19 0600  WBC 9.8 4.7  HGB 13.2 11.8*  HCT 39.7 36.8*  MCV 90.4 93.9  PLT 192 150   Cardiac Enzymes: No results for input(s): CKTOTAL, CKMB, CKMBINDEX, TROPONINI in the last 168 hours. BNP: Invalid input(s): POCBNP CBG: No results for input(s): GLUCAP in the last 168 hours. D-Dimer No results for input(s): DDIMER in the last 72 hours. Hgb A1c No results for input(s): HGBA1C in the last 72 hours. Lipid Profile No results for input(s): CHOL, HDL, LDLCALC, TRIG, CHOLHDL, LDLDIRECT in the last 72 hours. Thyroid function studies No results for input(s): TSH, T4TOTAL, T3FREE, THYROIDAB in the last 72 hours.  Invalid input(s): FREET3 Anemia work up No results for input(s): VITAMINB12, FOLATE, FERRITIN, TIBC, IRON, RETICCTPCT in the last 72 hours. Urinalysis    Component Value Date/Time   COLORURINE COLORLESS (A) 06/10/2019 1220   APPEARANCEUR CLEAR 06/10/2019 1220   LABSPEC 1.016 06/10/2019 1220   PHURINE 6.0 06/10/2019 1220   GLUCOSEU NEGATIVE 06/10/2019 1220   HGBUR NEGATIVE 06/10/2019 1220   BILIRUBINUR NEGATIVE 06/10/2019 1220   KETONESUR NEGATIVE 06/10/2019 1220   PROTEINUR NEGATIVE 06/10/2019 1220   NITRITE NEGATIVE 06/10/2019 1220   LEUKOCYTESUR NEGATIVE 06/10/2019 1220   Sepsis Labs Invalid input(s): PROCALCITONIN,  WBC,  LACTICIDVEN Microbiology Recent Results (from the past 240 hour(s))  Culture, blood (routine x 2)     Status: None (Preliminary result)   Collection Time: 10/29/19  8:37 PM   Specimen: Left Antecubital; Blood  Result Value Ref Range Status   Specimen Description LEFT ANTECUBITAL  Final   Special Requests   Final    BOTTLES DRAWN AEROBIC AND ANAEROBIC Blood Culture adequate volume   Culture   Final    NO GROWTH < 12 HOURS Performed at [facility], [address], [place], [place] [address]    Report Status PENDING  Incomplete  Culture, blood (routine x 2)     Status: None (Preliminary result)   Collection Time: 10/29/19  8:41 PM   Specimen: BLOOD LEFT HAND  Result Value Ref Range Status   Specimen Description BLOOD LEFT HAND  Final   Special Requests   Final  BOTTLES DRAWN AEROBIC AND ANAEROBIC Blood Culture adequate volume   Culture   Final    NO GROWTH < 12 HOURS Performed at [facility], [address], [place], [place] [address]    Report Status PENDING  Incomplete     Time coordinating discharge: 35 minutes  SIGNED:   Erick Blinks, DO [facility] 10/30/2019, 8:41 AM  If 7PM-7AM, please contact night-coverage [email or web address]

## 2019-10-30 NOTE — Progress Notes (Signed)
Patient ambulated in hall with no oxygen and O2 was 94-96% with no complaints of shortness of breath, will notify MD.

## 2019-11-01 NOTE — ED Provider Notes (Signed)
[facility] Emergency Department Provider Note MRN:  [id number]  Arrival date & time: 11/01/19     Chief Complaint   Shortness of Breath   History of Present Illness   Christian Ellison is a 59 y.o. year-old male with a history of esophageal stricture, recurrent pneumonia presenting to the ED with chief complaint of shortness of breath.  3 4 days of cough, subjective fevers, worsening shortness of breath today.  Denies headache or vision change, no vomiting, no chest pain, no abdominal pain, no numbness or weakness.  Symptoms are moderate, constant, shortness of breath worse with eating.  Had low oxygen numbers and fever at the urgent care today.  Review of Systems  A complete 10 system review of systems was obtained and all systems are negative except as noted in the HPI and PMH.   Patient's Health History    Past Medical History:  Diagnosis Date  . Anxiety   . Aspiration pneumonia (HCC) 05/26/2018   RLL  . Chronic pain   . Esophageal stricture   . GERD (gastroesophageal reflux disease)   . 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   Marland Kitchen Opiate abuse, continuous (HCC)     Past Surgical History:  Procedure Laterality Date  . BIOPSY  03/18/2018   Procedure: BIOPSY;  Surgeon: Malissa Hippo, MD;  [facility];  Service: Endoscopy;;  gastric   . COLONOSCOPY N/A 11/22/2013   Dr. Karilyn Cota: Normal except for hemorrhoids.  Next colonoscopy 10 years.  . ESOPHAGEAL DILATION N/A 03/18/2018   Procedure: ESOPHAGEAL DILATION;  Surgeon: Malissa Hippo, MD;  [facility];  Service: Endoscopy;  Laterality: N/A;  . ESOPHAGEAL MANOMETRY N/A 06/15/2018   Procedure: ESOPHAGEAL MANOMETRY (EM);  Surgeon: Napoleon Form, MD;  Location: [facility];  Service: Endoscopy;  Laterality: N/A;  . ESOPHAGOGASTRODUODENOSCOPY (EGD) WITH PROPOFOL N/A 03/18/2018   Dr. Karilyn Cota: Benign-appearing mild esophageal stenosis proximal to the GE J, status post  dilation.  2 cm hiatal hernia.  Gastritis, biopsies negative for H. pylori  . Fracture Right Leg     Patient has rod/screws in this leg  . Rotor Cuff  2012   Right Shoulder    Family History  Problem Relation Age of Onset  . Breast cancer Mother   . Colon cancer Father   . Bladder Cancer Father   . Prostate cancer Father   . Hypertension Sister   . Hypothyroidism Sister   . Healthy Sister   . Healthy Daughter   . Healthy Son   . Healthy Son     Social History   Socioeconomic History  . Marital status: Married    Spouse name: Not on file  . Number of children: Not on file  . Years of education: Not on file  . Highest education level: Not on file  Occupational History  . Occupation: unemployed  Tobacco Use  . Smoking status: Never Smoker  . Smokeless tobacco: Never Used  Substance and Sexual Activity  . Alcohol use: Not Currently  . Drug use: Not Currently    Types: Benzodiazepines    Comment: opiates  . Sexual activity: Not on file  Other Topics Concern  . Not on file  Social History Narrative  . Not on file   Social Determinants of Health   Financial Resource Strain:   . Difficulty of Paying Living Expenses:   Food Insecurity:   . Worried About Programme researcher, broadcasting/film/video in the Last  Year:   . Ran Out of Food in the Last Year:   Transportation Needs:   . Freight forwarder (Medical):   Marland Kitchen Lack of Transportation (Non-Medical):   Physical Activity:   . Days of Exercise per Week:   . Minutes of Exercise per Session:   Stress:   . Feeling of Stress :   Social Connections:   . Frequency of Communication with Friends and Family:   . Frequency of Social Gatherings with Friends and Family:   . Attends Religious Services:   . Active Member of Clubs or Organizations:   . Attends Banker Meetings:   Marland Kitchen Marital Status:   Intimate Partner Violence:   . Fear of Current or Ex-Partner:   . Emotionally Abused:   Marland Kitchen Physically Abused:   . Sexually Abused:        Physical Exam   Vitals:   10/30/19 0522 10/30/19 0809  BP: 99/66   Pulse: 64   Resp: 17   Temp: (!) 97.3 F (36.3 C)   SpO2: 96% 97%    CONSTITUTIONAL: Well-appearing, NAD NEURO:  Alert and oriented x 3, no focal deficits EYES:  eyes equal and reactive ENT/NECK:  no LAD, no JVD CARDIO: Tachycardic rate, well-perfused, normal S1 and S2 PULM: Scattered rhonchi, mild tachypnea GI/GU:  normal bowel sounds, non-distended, non-tender MSK/SPINE:  No gross deformities, no edema SKIN:  no rash, atraumatic PSYCH:  Appropriate speech and behavior  *Additional and/or pertinent findings included in MDM below  Diagnostic and Interventional Summary    EKG Interpretation  Date/Time:  Sunday October 29 2019 17:20:48 EDT Ventricular Rate:  94 PR Interval:    QRS Duration: 83 QT Interval:  343 QTC Calculation: 429 R Axis:   4 Text Interpretation: Sinus rhythm Borderline T wave abnormalities Confirmed by Kennis Carina [id number]) on 10/29/2019 5:22:28 PM      Labs Reviewed  BASIC METABOLIC PANEL - Abnormal; Notable for the following components:      Result Value   Glucose, Bld 139 (*)    Calcium 8.6 (*)    All other components within normal limits  BASIC METABOLIC PANEL - Abnormal; Notable for the following components:   Glucose, Bld 111 (*)    Calcium 8.1 (*)    All other components within normal limits  CBC - Abnormal; Notable for the following components:   RBC 3.92 (*)    Hemoglobin 11.8 (*)    HCT 36.8 (*)    All other components within normal limits  C-REACTIVE PROTEIN - Abnormal; Notable for the following components:   CRP 6.9 (*)    All other components within normal limits  SEDIMENTATION RATE - Abnormal; Notable for the following components:   Sed Rate 18 (*)    All other components within normal limits  RESPIRATORY PANEL BY PCR  CULTURE, BLOOD (ROUTINE X 2)  CULTURE, BLOOD (ROUTINE X 2)  URINE CULTURE  CBC  BRAIN NATRIURETIC PEPTIDE  PROCALCITONIN  FERRITIN     DG Chest Port 1 View  Final Result      Medications  cefTRIAXone (ROCEPHIN) 2 g in sodium chloride 0.9 % 100 mL IVPB (0 g Intravenous Stopped 10/29/19 2031)     Procedures  /  Critical Care Procedures  ED Course and Medical Decision Making  I have reviewed the triage vital signs, the nursing notes, and pertinent available records from the EMR.  Pertinent labs & imaging results that were available during my care of the patient were reviewed  by me and considered in my medical decision making (see below for details).     Fever, mild tachypnea, requiring 2 L nasal cannula later in the emergency department to maintain saturations.  Chest x-ray suggestive of infiltrate or pneumonia, admitted to hospitalist for further care.    Barth Kirks. Sedonia Small, MD [place] [email or web address]  Final Clinical Impressions(s) / ED Diagnoses     ICD-10-CM   1. Acute respiratory failure (HCC)  J96.00 CANCELED: DG Chest 2 View    CANCELED: DG Chest 2 View  2. Acute respiratory failure with hypoxia (HCC)  J96.01     ED Discharge Orders         Ordered    guaiFENesin-dextromethorphan (ROBITUSSIN DM) 100-10 MG/5ML syrup  Every 6 hours PRN     10/30/19 0840    amoxicillin-clavulanate (AUGMENTIN) 500-125 MG tablet  2 times daily     10/30/19 0840    Increase activity slowly     10/30/19 0840    Diet - low sodium heart healthy     10/30/19 0840           Discharge Instructions Discussed with and Provided to Patient:   Discharge Instructions   None       Maudie Flakes, MD 11/01/19 2336

## 2019-11-03 LAB — CULTURE, BLOOD (ROUTINE X 2)
Culture: NO GROWTH
Culture: NO GROWTH
Special Requests: ADEQUATE
Special Requests: ADEQUATE

## 2019-11-15 LAB — ALDOSTERONE + RENIN ACTIVITY W/ RATIO
ALDO / PRA Ratio: 3.2 (ref 0.0–30.0)
Aldosterone: 12.9 ng/dL (ref 0.0–30.0)
PRA LC/MS/MS: 4.076 ng/mL/hr (ref 0.167–5.380)

## 2019-11-15 LAB — ACTH

## 2019-11-16 ENCOUNTER — Ambulatory Visit: Payer: 59 | Attending: Internal Medicine

## 2019-11-16 DIAGNOSIS — Z23 Encounter for immunization: Secondary | ICD-10-CM

## 2019-11-16 NOTE — Progress Notes (Signed)
   Covid-19 Vaccination Clinic  Name:  DAVINE COBA    MRN: [id number] DOB: 21-Feb-1961  11/16/2019  Mr. Godinho was observed post Covid-19 immunization for 15 minutes without incident. He was provided with Vaccine Information Sheet and instruction to access the V-Safe system.   Mr. Ghuman was instructed to call 911 with any severe reactions post vaccine: Marland Kitchen Difficulty breathing  . Swelling of face and throat  . A fast heartbeat  . A bad rash all over body  . Dizziness and weakness   Immunizations Administered    Name Date Dose VIS Date Route   Moderna COVID-19 Vaccine 11/16/2019 11:51 AM 0.5 mL 07/18/2019 Intramuscular   Manufacturer: Moderna   Lot: [id number]   NDC: 48403-979-53

## 2019-11-21 ENCOUNTER — Ambulatory Visit: Payer: Self-pay

## 2019-12-19 ENCOUNTER — Ambulatory Visit: Payer: 59 | Attending: Internal Medicine

## 2019-12-19 DIAGNOSIS — Z23 Encounter for immunization: Secondary | ICD-10-CM

## 2019-12-19 NOTE — Progress Notes (Signed)
   Covid-19 Vaccination Clinic  Name:  Christian Ellison    MRN: [id number] DOB: [birth year]  12/19/2019  [name] was observed post Covid-19 immunization for 15 minutes without incident. He was provided with Vaccine Information Sheet and instruction to access the V-Safe system.   [name] was instructed to call 911 with any severe reactions post vaccine: Marland Kitchen Difficulty breathing  . Swelling of face and throat  . A fast heartbeat  . A bad rash all over body  . Dizziness and weakness   Immunizations Administered    Name Date Dose VIS Date Route   Moderna COVID-19 Vaccine 12/19/2019 11:26 AM 0.5 mL 07/2019 Intramuscular   Manufacturer: Moderna   Lot: [id number]   NDC: 74163-845-36

## 2020-01-29 ENCOUNTER — Other Ambulatory Visit: Payer: Self-pay

## 2020-01-29 ENCOUNTER — Encounter: Payer: Self-pay | Admitting: Emergency Medicine

## 2020-01-29 ENCOUNTER — Ambulatory Visit
Admission: EM | Admit: 2020-01-29 | Discharge: 2020-01-29 | Disposition: A | Discharge status: Home / Self Care | Payer: 59 | Attending: Emergency Medicine | Admitting: Emergency Medicine

## 2020-01-29 DIAGNOSIS — S80869A Insect bite (nonvenomous), unspecified lower leg, initial encounter: Secondary | ICD-10-CM | POA: Diagnosis not present

## 2020-01-29 DIAGNOSIS — R21 Rash and other nonspecific skin eruption: Secondary | ICD-10-CM

## 2020-01-29 DIAGNOSIS — W57XXXA Bitten or stung by nonvenomous insect and other nonvenomous arthropods, initial encounter: Secondary | ICD-10-CM

## 2020-01-29 MED ORDER — DOXYCYCLINE HYCLATE 100 MG PO CAPS
100.0000 mg | ORAL_CAPSULE | Freq: Two times a day (BID) | ORAL | 0 refills | Status: DC
Start: 2020-01-29 — End: 2020-07-04

## 2020-01-29 NOTE — ED Triage Notes (Addendum)
Pt bit by unknown insect on bilateral lower extremities  x 3-4 days ago.  States it was not ticks, they were small black bugs.  Pt states he has felt fatigued since the incident but he has been under a lot of stress recently.

## 2020-01-29 NOTE — ED Provider Notes (Signed)
[facility]    CSN: [id number] Arrival date & time: 01/29/20  1052      History   Chief Complaint Chief Complaint  Patient presents with  . Insect Bite    HPI Christian Ellison is a 59 y.o. male.   Who presented to the urgent care with a complaint of insect bite for the past 3 to 4 days.  Localized the bite to his lower extremities.  Described as black bugs.  Patient is not sure what insect bite him.  Denies any history of previous insect bite.  Has not tried any OTC medication.  Denies fever, chills, nausea, vomiting, diarrhea, fatigue, abdominal pain, dizziness.  The history is provided by the patient. No language interpreter was used.    Past Medical History:  Diagnosis Date  . Anxiety   . Aspiration pneumonia ([place]) 05/26/2018   RLL  . Chronic pain   . Esophageal stricture   . GERD (gastroesophageal reflux disease)   . 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   Marland Kitchen Opiate abuse, continuous [facility])     Patient Active Problem List   Diagnosis Date Noted  . Pneumonia 06/24/2019  . Pneumonitis 06/24/2019  . Aspiration pneumonitis ([place]) 06/10/2019  . Closed fracture of xiphoid process   . PNA (pneumonia) 05/15/2019  . Multifocal pneumonia 03/11/2019  . Acute respiratory failure with hypoxemia ([place]) 03/11/2019  . Sinus tachycardia 03/11/2019  . Sepsis due to pneumonia ([place]) 03/11/2019  . Hypothyroidism 03/11/2019  . Bronchopneumonia 11/06/2018  . Acute Respiratory failure with hypoxia (2/2 Asp PNA) 11/06/2018  . Recurrent aspiration pneumonia ([place])   . Fever 06/09/2018  . Tachycardia 06/09/2018  . Hypoxia 06/09/2018  . Hypotension 06/09/2018  . Esophageal motility disorder   . Aspiration pneumonia of right lower lobe ([place])   . Sepsis ([place]) 05/26/2018  . Pulmonary edema 02/02/2018  . HCAP (healthcare-associated pneumonia) 02/02/2018  . Weakness generalized 02/02/2018  . Hypertension 02/02/2018  . CHF (congestive heart failure) ([place]) 02/02/2018   . Nausea & vomiting 02/02/2018  . Dysphagia 02/02/2018  . Esophageal stricture/stenosis/esophageal dysmotility with recurrent aspiration 02/02/2018  . Esophageal stenosis 02/02/2018  . GERD (gastroesophageal reflux disease) 02/02/2018  . Polysubstance abuse ([place]) 02/02/2018  . Acute respiratory failure with hypoxia ([place])   . Transaminitis   . Elevated troponin   . Overdose 01/02/2018  . Hx of hepatitis C 01/17/2013  . Depression 01/17/2013  . Anxiety with depression 01/17/2013  . Right knee pain 01/17/2013    Past Surgical History:  Procedure Laterality Date  . BIOPSY  03/18/2018   Procedure: BIOPSY;  Surgeon: Rogene Houston, MD;  [facility];  Service: Endoscopy;;  gastric   . COLONOSCOPY N/A 11/22/2013   Dr. Laural Golden: Normal except for hemorrhoids.  Next colonoscopy 10 years.  . ESOPHAGEAL DILATION N/A 03/18/2018   Procedure: ESOPHAGEAL DILATION;  Surgeon: Rogene Houston, MD;  [facility];  Service: Endoscopy;  Laterality: N/A;  . ESOPHAGEAL MANOMETRY N/A 06/15/2018   Procedure: ESOPHAGEAL MANOMETRY (EM);  Surgeon: Mauri Pole, MD;  [facility];  Service: Endoscopy;  Laterality: N/A;  . ESOPHAGOGASTRODUODENOSCOPY (EGD) WITH PROPOFOL N/A 03/18/2018   Dr. Laural Golden: Benign-appearing mild esophageal stenosis proximal to the GE J, status post dilation.  2 cm hiatal hernia.  Gastritis, biopsies negative for H. pylori  . Fracture Right Leg     Patient has rod/screws in this leg  . Rotor Cuff  2012   Right Shoulder  Home Medications    Prior to Admission medications   Medication Sig Start Date End Date Taking? Authorizing Provider  acetaminophen (TYLENOL) 325 MG tablet Take 2 tablets (650 mg total) by mouth every 6 (six) hours as needed for mild pain (or Fever >/= 101). 05/17/19   Shon Hale, MD  albuterol (VENTOLIN HFA) 108 (90 Base) MCG/ACT inhaler Inhale 2 puffs into the lungs every 6 (six) hours as needed for wheezing or  shortness of breath (cough). 05/17/19   Shon Hale, MD  ALPRAZolam Prudy Feeler) 1 MG tablet Take 1 mg by mouth 3 (three) times daily as needed for anxiety.  10/22/19   [provider]  aspirin EC 81 MG tablet Take 1 tablet (81 mg total) by mouth daily with breakfast. Patient taking differently: Take 81 mg by mouth at bedtime.  03/13/19   Shon Hale, MD  citalopram (CELEXA) 40 MG tablet Take 40 mg by mouth daily as needed (for mood).  10/23/19   [provider]  diltiazem (CARDIZEM CD) 120 MG 24 hr capsule Take 1 capsule (120 mg total) by mouth daily. 05/17/19 05/16/20  Shon Hale, MD  doxycycline (VIBRAMYCIN) 100 MG capsule Take 1 capsule (100 mg total) by mouth 2 (two) times daily. 01/29/20   Rivers Hamrick, Zachery Dakins, FNP  Glucosamine HCl (GLUCOSAMINE PO) Take 1 tablet by mouth at bedtime.     [provider]  guaiFENesin-dextromethorphan (ROBITUSSIN DM) 100-10 MG/5ML syrup Take 10 mLs by mouth every 6 (six) hours as needed for cough. 10/30/19   Sherryll Burger, Pratik D, DO  levothyroxine (SYNTHROID, LEVOTHROID) 25 MCG tablet Take 25 mcg by mouth daily. 05/13/18   [provider]  Multiple Vitamin (MULTIVITAMIN WITH MINERALS) TABS tablet Take 1 tablet by mouth daily. 50+    [provider]  pantoprazole (PROTONIX) 40 MG tablet Take 1 tablet (40 mg total) by mouth 2 (two) times daily. 05/17/19 05/16/20  Shon Hale, MD    Family History Family History  Problem Relation Age of Onset  . Breast cancer Mother   . Colon cancer Father   . Bladder Cancer Father   . Prostate cancer Father   . Hypertension Sister   . Hypothyroidism Sister   . Healthy Sister   . Healthy Daughter   . Healthy Son   . Healthy Son     Social History Social History   Tobacco Use  . Smoking status: Never Smoker  . Smokeless tobacco: Never Used  Vaping Use  . Vaping Use: Never used  Substance Use Topics  . Alcohol use: Not Currently  . Drug use: Not Currently    Types:  Benzodiazepines    Comment: opiates     Allergies   Patient has no known allergies.   Review of Systems Review of Systems  Constitutional: Negative.   Respiratory: Negative.   Cardiovascular: Negative.   Skin: Positive for color change and rash.  All other systems reviewed and are negative.    Physical Exam Triage Vital Signs ED Triage Vitals  Enc Vitals Group     BP 01/29/20 1139 112/77     Pulse Rate 01/29/20 1139 71     Resp 01/29/20 1139 18     Temp 01/29/20 1139 98.2 F (36.8 C)     Temp Source 01/29/20 1139 Oral     SpO2 01/29/20 1139 93 %     Weight 01/29/20 1138 220 lb (99.8 kg)     Height 01/29/20 1138 6' (1.829 m)     Head Circumference --  Peak Flow --      Pain Score 01/29/20 1138 0     Pain Loc --      Pain Edu? --      Excl. in GC? --    No data found.  Updated Vital Signs BP 112/77   Pulse 71   Temp 98.2 F (36.8 C) (Oral)   Resp 18   Ht 6' (1.829 m)   Wt 220 lb (99.8 kg)   SpO2 93%   BMI 29.84 kg/m   Visual Acuity Right Eye Distance:   Left Eye Distance:   Bilateral Distance:    Right Eye Near:   Left Eye Near:    Bilateral Near:     Physical Exam Vitals and nursing note reviewed.  Constitutional:      General: He is not in acute distress.    Appearance: Normal appearance. He is normal weight. He is not ill-appearing, toxic-appearing or diaphoretic.  Cardiovascular:     Rate and Rhythm: Normal rate and regular rhythm.     Pulses: Normal pulses.     Heart sounds: Normal heart sounds. No murmur heard.  No friction rub. No gallop.   Pulmonary:     Effort: Pulmonary effort is normal. No respiratory distress.     Breath sounds: Normal breath sounds. No stridor. No wheezing, rhonchi or rales.  Chest:     Chest wall: No tenderness.  Skin:    General: Skin is warm.     Findings: Rash present. Rash is not scaling.     Comments: Insect bite rash with black scab  Neurological:     Mental Status: He is alert.      UC  Treatments / Results  Labs (all labs ordered are listed, but only abnormal results are displayed) Labs Reviewed - No data to display  EKG   Radiology No results found.  Procedures Procedures (including critical care time)  Medications Ordered in UC Medications - No data to display  Initial Impression / Assessment and Plan / UC Course  I have reviewed the triage vital signs and the nursing notes.  Pertinent labs & imaging results that were available during my care of the patient were reviewed by me and considered in my medical decision making (see chart for details).    Patient is stable at discharge.  Will prescribe doxycycline 10 days course.  Was advised to follow PCP.  To return or go to ED for worsening symptoms.  Final Clinical Impressions(s) / UC Diagnoses   Final diagnoses:  Insect bite of lower leg, unspecified laterality, initial encounter  Rash in adult     Discharge Instructions     Doxycycline prescribed take as directed Follow-up with PCP Return or go to ED for worsening of symptoms    ED Prescriptions    Medication Sig Dispense Auth. Provider   doxycycline (VIBRAMYCIN) 100 MG capsule Take 1 capsule (100 mg total) by mouth 2 (two) times daily. 20 capsule Avantae Bither, Zachery Dakins, FNP     PDMP not reviewed this encounter.   Durward Parcel, FNP 01/29/20 1241

## 2020-01-29 NOTE — Discharge Instructions (Addendum)
Doxycycline prescribed take as directed Follow-up with PCP Return or go to ED for worsening of symptoms

## 2020-02-13 ENCOUNTER — Other Ambulatory Visit: Payer: Self-pay

## 2020-02-13 ENCOUNTER — Ambulatory Visit
Admission: EM | Admit: 2020-02-13 | Discharge: 2020-02-13 | Disposition: A | Discharge status: Home / Self Care | Payer: 59 | Attending: Emergency Medicine | Admitting: Emergency Medicine

## 2020-02-13 ENCOUNTER — Encounter: Payer: Self-pay | Admitting: Emergency Medicine

## 2020-02-13 ENCOUNTER — Ambulatory Visit (INDEPENDENT_AMBULATORY_CARE_PROVIDER_SITE_OTHER): Payer: 59

## 2020-02-13 DIAGNOSIS — M940 Chondrocostal junction syndrome [Tietze]: Secondary | ICD-10-CM | POA: Diagnosis not present

## 2020-02-13 DIAGNOSIS — T148XXA Other injury of unspecified body region, initial encounter: Secondary | ICD-10-CM | POA: Diagnosis not present

## 2020-02-13 DIAGNOSIS — R0781 Pleurodynia: Secondary | ICD-10-CM | POA: Diagnosis not present

## 2020-02-13 DIAGNOSIS — W19XXXA Unspecified fall, initial encounter: Secondary | ICD-10-CM

## 2020-02-13 MED ORDER — CEPHALEXIN 500 MG PO CAPS
500.0000 mg | ORAL_CAPSULE | Freq: Four times a day (QID) | ORAL | 0 refills | Status: DC
Start: 2020-02-13 — End: 2020-07-04

## 2020-02-13 MED ORDER — MUPIROCIN 2 % EX OINT: 1.0000 "application " | TOPICAL_OINTMENT | Freq: Two times a day (BID) | CUTANEOUS | 0 refills | Status: DC

## 2020-02-13 MED ORDER — TETANUS-DIPHTH-ACELL PERTUSSIS 5-2.5-18.5 LF-MCG/0.5 IM SUSP
0.5000 mL | Freq: Once | INTRAMUSCULAR | Status: AC
  Administered 2020-02-13: 0.5 mL via INTRAMUSCULAR

## 2020-02-13 NOTE — ED Provider Notes (Signed)
[facility]   [id number] 02/13/20 Arrival Time: 1740   CC: Fall   SUBJECTIVE: History from: patient.  Christian Ellison is a 59 y.o. male who presented to the urgent care with a complaint of fall last night at home.  Report he fell on a piece of equipment and developed a wound on the left arm. He localizes the pain around the left rib cage.  He describes the pain as constant and achy.  He has tried OTC medications without relief.  His symptoms are made worse with ROM.  He denies similar symptoms in the past.  Denies chills, fever, nausea, vomiting, diarrhea.  Tetanus immunization status is unknown  ROS: As per HPI.  All other pertinent ROS negative.     Past Medical History:  Diagnosis Date  . Anxiety   . Aspiration pneumonia (HCC) 05/26/2018   RLL  . Chronic pain   . Esophageal stricture   . GERD (gastroesophageal reflux disease)   . 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   Marland Kitchen Opiate abuse, continuous (HCC)    Past Surgical History:  Procedure Laterality Date  . BIOPSY  03/18/2018   Procedure: BIOPSY;  Surgeon: Malissa Hippo, MD;  Location: [facility];  Service: Endoscopy;;  gastric   . COLONOSCOPY N/A 11/22/2013   Dr. Karilyn Cota: Normal except for hemorrhoids.  Next colonoscopy 10 years.  . ESOPHAGEAL DILATION N/A 03/18/2018   Procedure: ESOPHAGEAL DILATION;  Surgeon: Malissa Hippo, MD;  Location: [facility];  Service: Endoscopy;  Laterality: N/A;  . ESOPHAGEAL MANOMETRY N/A 06/15/2018   Procedure: ESOPHAGEAL MANOMETRY (EM);  Surgeon: Napoleon Form, MD;  Location: [facility];  Service: Endoscopy;  Laterality: N/A;  . ESOPHAGOGASTRODUODENOSCOPY (EGD) WITH PROPOFOL N/A 03/18/2018   Dr. Karilyn Cota: Benign-appearing mild esophageal stenosis proximal to the GE J, status post dilation.  2 cm hiatal hernia.  Gastritis, biopsies negative for H. pylori  . Fracture Right Leg     Patient has rod/screws in this leg  . Rotor Cuff  2012   Right Shoulder   No  Known Allergies No current facility-administered medications on file prior to encounter.   Current Outpatient Medications on File Prior to Encounter  Medication Sig Dispense Refill  . acetaminophen (TYLENOL) 325 MG tablet Take 2 tablets (650 mg total) by mouth every 6 (six) hours as needed for mild pain (or Fever >/= 101). 30 tablet 1  . albuterol (VENTOLIN HFA) 108 (90 Base) MCG/ACT inhaler Inhale 2 puffs into the lungs every 6 (six) hours as needed for wheezing or shortness of breath (cough). 18 g 1  . ALPRAZolam (XANAX) 1 MG tablet Take 1 mg by mouth 3 (three) times daily as needed for anxiety.     Marland Kitchen aspirin EC 81 MG tablet Take 1 tablet (81 mg total) by mouth daily with breakfast. (Patient taking differently: Take 81 mg by mouth at bedtime. ) 30 tablet 2  . citalopram (CELEXA) 40 MG tablet Take 40 mg by mouth daily as needed (for mood).     Marland Kitchen diltiazem (CARDIZEM CD) 120 MG 24 hr capsule Take 1 capsule (120 mg total) by mouth daily. 30 capsule 5  . doxycycline (VIBRAMYCIN) 100 MG capsule Take 1 capsule (100 mg total) by mouth 2 (two) times daily. 20 capsule 0  . Glucosamine HCl (GLUCOSAMINE PO) Take 1 tablet by mouth at bedtime.     Marland Kitchen guaiFENesin-dextromethorphan (ROBITUSSIN DM) 100-10 MG/5ML syrup Take 10 mLs by mouth every 6 (six)  hours as needed for cough. 118 mL 0  . levothyroxine (SYNTHROID, LEVOTHROID) 25 MCG tablet Take 25 mcg by mouth daily.  11  . Multiple Vitamin (MULTIVITAMIN WITH MINERALS) TABS tablet Take 1 tablet by mouth daily. 50+    . pantoprazole (PROTONIX) 40 MG tablet Take 1 tablet (40 mg total) by mouth 2 (two) times daily. 60 tablet 2   Social History   Socioeconomic History  . Marital status: Married    Spouse name: Not on file  . Number of children: Not on file  . Years of education: Not on file  . Highest education level: Not on file  Occupational History  . Occupation: unemployed  Tobacco Use  . Smoking status: Never Smoker  . Smokeless tobacco: Never Used    Vaping Use  . Vaping Use: Never used  Substance and Sexual Activity  . Alcohol use: Not Currently  . Drug use: Not Currently    Types: Benzodiazepines    Comment: opiates  . Sexual activity: Not on file  Other Topics Concern  . Not on file  Social History Narrative  . Not on file   Social Determinants of Health   Financial Resource Strain:   . Difficulty of Paying Living Expenses:   Food Insecurity:   . Worried About Programme researcher, broadcasting/film/video in the Last Year:   . Barista in the Last Year:   Transportation Needs:   . Freight forwarder (Medical):   Marland Kitchen Lack of Transportation (Non-Medical):   Physical Activity:   . Days of Exercise per Week:   . Minutes of Exercise per Session:   Stress:   . Feeling of Stress :   Social Connections:   . Frequency of Communication with Friends and Family:   . Frequency of Social Gatherings with Friends and Family:   . Attends Religious Services:   . Active Member of Clubs or Organizations:   . Attends Banker Meetings:   Marland Kitchen Marital Status:   Intimate Partner Violence:   . Fear of Current or Ex-Partner:   . Emotionally Abused:   Marland Kitchen Physically Abused:   . Sexually Abused:    Family History  Problem Relation Age of Onset  . Breast cancer Mother   . Colon cancer Father   . Bladder Cancer Father   . Prostate cancer Father   . Hypertension Sister   . Hypothyroidism Sister   . Healthy Sister   . Healthy Daughter   . Healthy Son   . Healthy Son     OBJECTIVE:  Vitals:   02/13/20 1752 02/13/20 1753  BP: 125/78   Pulse: 97   Resp: 17   Temp: 98.7 F (37.1 C)   TempSrc: Oral   SpO2: 95%   Weight:  200 lb (90.7 kg)  Height:  6' (1.829 m)     Physical Exam Vitals and nursing note reviewed.  Constitutional:      General: He is not in acute distress.    Appearance: Normal appearance. He is normal weight. He is not ill-appearing, toxic-appearing or diaphoretic.  Cardiovascular:     Rate and Rhythm: Normal  rate and regular rhythm.     Pulses: Normal pulses.     Heart sounds: Normal heart sounds. No murmur heard.  No friction rub. No gallop.   Pulmonary:     Effort: Pulmonary effort is normal. No respiratory distress.     Breath sounds: Normal breath sounds. No stridor. No wheezing, rhonchi or rales.  Chest:     Chest wall: No tenderness.  Abdominal:     General: There is no distension.     Palpations: There is no mass.     Tenderness: There is no abdominal tenderness. There is no right CVA tenderness, left CVA tenderness, guarding or rebound.     Hernia: No hernia is present.  Musculoskeletal:        General: Tenderness present. No swelling.  Skin:    Findings: Wound present.  Neurological:     Mental Status: He is alert.    Psychological: alert and cooperative; normal mood and affect  LABS:  No results found for this or any previous visit (from the past 24 hour(s)).   ASSESSMENT & PLAN:  1. Fall, initial encounter   2. Open wound of skin   3. Costochondritis     Meds ordered this encounter  Medications  . Tdap (BOOSTRIX) injection 0.5 mL  . cephALEXin (KEFLEX) 500 MG capsule    Sig: Take 1 capsule (500 mg total) by mouth 4 (four) times daily.    Dispense:  20 capsule    Refill:  0  . mupirocin ointment (BACTROBAN) 2 %    Sig: Apply 1 application topically 2 (two) times daily.    Dispense:  22 g    Refill:  0   Patient is stable at discharge.  Rib cage pain is likely from costochondritis   Left chest x-ray is negative for bony abnormality including fracture or dislocation.  I have reviewed the x-ray myself and the radiologist interpretation.  I am in agreement with the radiologist interpretation.   Discharge instructions Keep wound clean with water and mild soap Apply thin layer of Bactroban daily Take antibiotic as prescribed until completion Take OTC Tylenol/ibuprofen as needed for pain Follow-up with PCP Return or go to ED for worsening  symptoms   Reviewed expectations re: course of current medical issues. Questions answered. Outlined signs and symptoms indicating need for more acute intervention. Patient verbalized understanding. After Visit Summary given.      Note: This document was prepared using Dragon voice recognition software and may include unintentional dictation errors.    Durward Parcel, FNP 02/13/20 1914

## 2020-02-13 NOTE — ED Triage Notes (Signed)
Puncture wound from a piece of equipment last night from a fall.  Not sure last time he had a tetanus shot. Pt also reports pain in LT shoulder, lt rib area and lt wrist.

## 2020-02-13 NOTE — Discharge Instructions (Addendum)
Keep wound clean with water and mild soap Apply thin layer of Bactroban daily Take antibiotic as prescribed until completion Take OTC Tylenol/ibuprofen as needed for pain Follow-up with PCP Return or go to ED for worsening symptoms

## 2020-02-17 ENCOUNTER — Encounter (HOSPITAL_COMMUNITY): Payer: Self-pay

## 2020-02-17 ENCOUNTER — Emergency Department (HOSPITAL_COMMUNITY)
Admission: EM | Admit: 2020-02-17 | Discharge: 2020-02-17 | Disposition: A | Discharge status: Home / Self Care | Payer: 59 | Attending: Emergency Medicine | Admitting: Emergency Medicine

## 2020-02-17 ENCOUNTER — Other Ambulatory Visit: Payer: Self-pay

## 2020-02-17 ENCOUNTER — Emergency Department (HOSPITAL_COMMUNITY): Payer: 59

## 2020-02-17 DIAGNOSIS — S299XXD Unspecified injury of thorax, subsequent encounter: Secondary | ICD-10-CM | POA: Diagnosis present

## 2020-02-17 DIAGNOSIS — E039 Hypothyroidism, unspecified: Secondary | ICD-10-CM | POA: Diagnosis not present

## 2020-02-17 DIAGNOSIS — I509 Heart failure, unspecified: Secondary | ICD-10-CM | POA: Diagnosis not present

## 2020-02-17 DIAGNOSIS — Z79899 Other long term (current) drug therapy: Secondary | ICD-10-CM | POA: Insufficient documentation

## 2020-02-17 DIAGNOSIS — I110 Hypertensive heart disease with heart failure: Secondary | ICD-10-CM | POA: Diagnosis not present

## 2020-02-17 DIAGNOSIS — F1910 Other psychoactive substance abuse, uncomplicated: Secondary | ICD-10-CM | POA: Diagnosis not present

## 2020-02-17 DIAGNOSIS — S20212D Contusion of left front wall of thorax, subsequent encounter: Secondary | ICD-10-CM

## 2020-02-17 DIAGNOSIS — S20219D Contusion of unspecified front wall of thorax, subsequent encounter: Secondary | ICD-10-CM | POA: Insufficient documentation

## 2020-02-17 DIAGNOSIS — X58XXXD Exposure to other specified factors, subsequent encounter: Secondary | ICD-10-CM | POA: Diagnosis not present

## 2020-02-17 DIAGNOSIS — Z7982 Long term (current) use of aspirin: Secondary | ICD-10-CM | POA: Diagnosis not present

## 2020-02-17 DIAGNOSIS — R000 Tachycardia, unspecified: Secondary | ICD-10-CM | POA: Insufficient documentation

## 2020-02-17 MED ORDER — CYCLOBENZAPRINE HCL 10 MG PO TABS
5.0000 mg | ORAL_TABLET | Freq: Once | ORAL | Status: AC
  Administered 2020-02-17: 5 mg via ORAL
  Filled 2020-02-17: qty 1

## 2020-02-17 MED ORDER — METHOCARBAMOL 500 MG PO TABS
ORAL_TABLET | ORAL | 0 refills | Status: DC
Start: 2020-02-17 — End: 2020-07-04

## 2020-02-17 MED ORDER — KETOROLAC TROMETHAMINE 30 MG/ML IJ SOLN
30.0000 mg | Freq: Once | INTRAMUSCULAR | Status: AC
  Administered 2020-02-17: 30 mg via INTRAVENOUS
  Filled 2020-02-17: qty 1

## 2020-02-17 NOTE — ED Notes (Signed)
Patient transported to X-ray 

## 2020-02-17 NOTE — ED Notes (Signed)
Transition to 4 liter high flow  , saturation 92

## 2020-02-17 NOTE — Discharge Instructions (Addendum)
Your x-ray shows you may have a small crack in the eighth rib on the left.  Do not do any heavy lifting or straining.  Take ibuprofen 600 mg plus acetaminophen every 6 hours as needed for pain.  Take the muscle relaxer as needed.  Use ice packs for comfort.  Try to take big deep breaths whenever you can to help prevent your lung collapsing or development of pneumonia.  Recheck if you get fever, cough, or struggle to breathe.

## 2020-02-17 NOTE — ED Notes (Signed)
ED Provider at bedside. 

## 2020-02-17 NOTE — ED Triage Notes (Signed)
Pt arrived via [facility] from home c/o SOB. [facility] report Pt in high 70's. Pt arrived on non-rebreather with O2 Sats of 100%.

## 2020-02-17 NOTE — ED Provider Notes (Signed)
[facility] EMERGENCY DEPARTMENT Provider Note   CSN: [id number] Arrival date & time: 02/17/20  0220   Time seen 2:18 AM on arrival  History Chief Complaint  Patient presents with  . Shortness of Breath    Christian Ellison is a 59 y.o. male.  HPI   Per EMS patient had fallen at home and had a puncture wound to his left upper arm and hit his left chest a couple days ago and was seen at the urgent care on June 29.  He had left rib films done at that time that did not show acute fracture or pneumonia.  He was treated with Keflex and had a tetanus booster update.  He states tonight he heard a pop in his left chest.  EMS reported he had clear lungs however he called because of increasing pain.  They state that his pulse ox was 81% and with a nonrebreather and improved to 98%.   PCP Jason Coop, FNP   Past Medical History:  Diagnosis Date  . Anxiety   . Aspiration pneumonia (HCC) 05/26/2018   RLL  . Chronic pain   . Esophageal stricture   . GERD (gastroesophageal reflux disease)   . 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   Marland Kitchen Opiate abuse, continuous [facility])     Patient Active Problem List   Diagnosis Date Noted  . Pneumonia 06/24/2019  . Pneumonitis 06/24/2019  . Aspiration pneumonitis (HCC) 06/10/2019  . Closed fracture of xiphoid process   . PNA (pneumonia) 05/15/2019  . Multifocal pneumonia 03/11/2019  . Acute respiratory failure with hypoxemia (HCC) 03/11/2019  . Sinus tachycardia 03/11/2019  . Sepsis due to pneumonia (HCC) 03/11/2019  . Hypothyroidism 03/11/2019  . Bronchopneumonia 11/06/2018  . Acute Respiratory failure with hypoxia (2/2 Asp PNA) 11/06/2018  . Recurrent aspiration pneumonia (HCC)   . Fever 06/09/2018  . Tachycardia 06/09/2018  . Hypoxia 06/09/2018  . Hypotension 06/09/2018  . Esophageal motility disorder   . Aspiration pneumonia of right lower lobe (HCC)   . Sepsis (HCC) 05/26/2018  . Pulmonary edema 02/02/2018  . HCAP  (healthcare-associated pneumonia) 02/02/2018  . Weakness generalized 02/02/2018  . Hypertension 02/02/2018  . CHF (congestive heart failure) (HCC) 02/02/2018  . Nausea & vomiting 02/02/2018  . Dysphagia 02/02/2018  . Esophageal stricture/stenosis/esophageal dysmotility with recurrent aspiration 02/02/2018  . Esophageal stenosis 02/02/2018  . GERD (gastroesophageal reflux disease) 02/02/2018  . Polysubstance abuse (HCC) 02/02/2018  . Acute respiratory failure with hypoxia (HCC)   . Transaminitis   . Elevated troponin   . Overdose 01/02/2018  . Hx of hepatitis C 01/17/2013  . Depression 01/17/2013  . Anxiety with depression 01/17/2013  . Right knee pain 01/17/2013    Past Surgical History:  Procedure Laterality Date  . BIOPSY  03/18/2018   Procedure: BIOPSY;  Surgeon: Malissa Hippo, MD;  Location: [facility];  Service: Endoscopy;;  gastric   . COLONOSCOPY N/A 11/22/2013   Dr. Karilyn Cota: Normal except for hemorrhoids.  Next colonoscopy 10 years.  . ESOPHAGEAL DILATION N/A 03/18/2018   Procedure: ESOPHAGEAL DILATION;  Surgeon: Malissa Hippo, MD;  Location: [facility];  Service: Endoscopy;  Laterality: N/A;  . ESOPHAGEAL MANOMETRY N/A 06/15/2018   Procedure: ESOPHAGEAL MANOMETRY (EM);  Surgeon: Napoleon Form, MD;  Location: [facility];  Service: Endoscopy;  Laterality: N/A;  . ESOPHAGOGASTRODUODENOSCOPY (EGD) WITH PROPOFOL N/A 03/18/2018   Dr. Karilyn Cota: Benign-appearing mild esophageal stenosis proximal to the GE J, status post  dilation.  2 cm hiatal hernia.  Gastritis, biopsies negative for H. pylori  . Fracture Right Leg     Patient has rod/screws in this leg  . Rotor Cuff  2012   Right Shoulder       Family History  Problem Relation Age of Onset  . Breast cancer Mother   . Colon cancer Father   . Bladder Cancer Father   . Prostate cancer Father   . Hypertension Sister   . Hypothyroidism Sister   . Healthy Sister   . Healthy Daughter   . Healthy Son   . Healthy  Son     Social History   Tobacco Use  . Smoking status: Never Smoker  . Smokeless tobacco: Never Used  Vaping Use  . Vaping Use: Never used  Substance Use Topics  . Alcohol use: Not Currently  . Drug use: Not Currently    Types: Benzodiazepines    Comment: opiates    Home Medications Prior to Admission medications   Medication Sig Start Date End Date Taking? Authorizing Provider  acetaminophen (TYLENOL) 325 MG tablet Take 2 tablets (650 mg total) by mouth every 6 (six) hours as needed for mild pain (or Fever >/= 101). 05/17/19   Shon HaleEmokpae, Courage, MD  albuterol (VENTOLIN HFA) 108 (90 Base) MCG/ACT inhaler Inhale 2 puffs into the lungs every 6 (six) hours as needed for wheezing or shortness of breath (cough). 05/17/19   Shon HaleEmokpae, Courage, MD  ALPRAZolam Prudy Feeler(XANAX) 1 MG tablet Take 1 mg by mouth 3 (three) times daily as needed for anxiety.  10/22/19   [provider]  aspirin EC 81 MG tablet Take 1 tablet (81 mg total) by mouth daily with breakfast. Patient taking differently: Take 81 mg by mouth at bedtime.  03/13/19   Shon HaleEmokpae, Courage, MD  cephALEXin (KEFLEX) 500 MG capsule Take 1 capsule (500 mg total) by mouth 4 (four) times daily. 02/13/20   Avegno, Zachery DakinsKomlanvi S, FNP  citalopram (CELEXA) 40 MG tablet Take 40 mg by mouth daily as needed (for mood).  10/23/19   [provider]  diltiazem (CARDIZEM CD) 120 MG 24 hr capsule Take 1 capsule (120 mg total) by mouth daily. 05/17/19 05/16/20  Shon HaleEmokpae, Courage, MD  doxycycline (VIBRAMYCIN) 100 MG capsule Take 1 capsule (100 mg total) by mouth 2 (two) times daily. 01/29/20   Avegno, Zachery DakinsKomlanvi S, FNP  Glucosamine HCl (GLUCOSAMINE PO) Take 1 tablet by mouth at bedtime.     [provider]  guaiFENesin-dextromethorphan (ROBITUSSIN DM) 100-10 MG/5ML syrup Take 10 mLs by mouth every 6 (six) hours as needed for cough. 10/30/19   Sherryll BurgerShah, Pratik D, DO  levothyroxine (SYNTHROID, LEVOTHROID) 25 MCG tablet Take 25 mcg by mouth daily. 05/13/18    [provider]  methocarbamol (ROBAXIN) 500 MG tablet Take 1 or 2 po Q 6hrs for chest wall pain 02/17/20   Devoria AlbeKnapp, Lysandra Loughmiller, MD  Multiple Vitamin (MULTIVITAMIN WITH MINERALS) TABS tablet Take 1 tablet by mouth daily. 50+    [provider]  mupirocin ointment (BACTROBAN) 2 % Apply 1 application topically 2 (two) times daily. 02/13/20   Avegno, Zachery DakinsKomlanvi S, FNP  pantoprazole (PROTONIX) 40 MG tablet Take 1 tablet (40 mg total) by mouth 2 (two) times daily. 05/17/19 05/16/20  Shon HaleEmokpae, Courage, MD    Allergies    Patient has no known allergies.  Review of Systems   Review of Systems  All other systems reviewed and are negative.   Physical Exam Updated Vital Signs BP 131/82  Pulse 92   Resp 18   Ht 6' (1.829 m)   Wt 90.7 kg   SpO2 95%   BMI 27.12 kg/m   Physical Exam Vitals and nursing note reviewed.  Constitutional:      General: He is not in acute distress.    Appearance: Normal appearance.  HENT:     Head: Normocephalic and atraumatic.  Eyes:     Extraocular Movements: Extraocular movements intact.     Conjunctiva/sclera: Conjunctivae normal.  Cardiovascular:     Rate and Rhythm: Regular rhythm. Tachycardia present.  Pulmonary:     Effort: Pulmonary effort is normal. No respiratory distress.     Breath sounds: Normal breath sounds. No stridor. No wheezing, rhonchi or rales.  Musculoskeletal:     Cervical back: Normal range of motion and neck supple.  Skin:    General: Skin is warm and dry.  Neurological:     General: No focal deficit present.     Mental Status: He is alert and oriented to person, place, and time.     Cranial Nerves: No cranial nerve deficit.  Psychiatric:        Mood and Affect: Mood normal.        Behavior: Behavior normal.        Thought Content: Thought content normal.     ED Results / Procedures / Treatments   Labs (all labs ordered are listed, but only abnormal results are displayed) Labs Reviewed - No data to display  EKG EKG  Interpretation  Date/Time:  Saturday February 17 2020 02:24:35 EDT Ventricular Rate:  106 PR Interval:    QRS Duration: 84 QT Interval:  318 QTC Calculation: 423 R Axis:   -21 Text Interpretation: Sinus tachycardia Borderline left axis deviation Minimal ST elevation, lateral leads Baseline wander in lead(s) II III aVF V2 V3 V4 V5 V6 No significant change since last tracing 29 Oct 2019 Confirmed by Devoria Albe ([id number]) on 02/17/2020 3:20:49 AM   Radiology DG Ribs Unilateral W/Chest Left  Result Date: 02/17/2020 CLINICAL DATA:  Shortness of breath after fall earlier this week. EXAM: LEFT RIBS AND CHEST - 3+ VIEW COMPARISON:  Left rib radiographs 02/13/2020 FINDINGS: Possible nondisplaced fracture of the left lateral eighth rib. There is no evidence of pneumothorax or pleural effusion. Both lungs are clear. Heart size and mediastinal contours are within normal limits. IMPRESSION: Possible nondisplaced left lateral eighth rib fracture. No pulmonary complication. Electronically Signed   By: Narda Rutherford M.D.   On: 02/17/2020 03:35    Procedures Procedures (including critical care time)  Medications Ordered in ED Medications  ketorolac (TORADOL) 30 MG/ML injection 30 mg (30 mg Intravenous Given 02/17/20 0420)  cyclobenzaprine (FLEXERIL) tablet 5 mg (5 mg Oral Given 02/17/20 0421)    ED Course  I have reviewed the triage vital signs and the nursing notes.  Pertinent labs & imaging results that were available during my care of the patient were reviewed by me and considered in my medical decision making (see chart for details).    MDM Rules/Calculators/A&P                          Concern with patient having hypoxia and feeling an acute pop in his chest and feeling short of breath was that he had a pneumothorax.  However he did have breath sounds on both sides.  Left rib films were ordered.  Patient's rib films showed possible nondisplaced rib fracture.  I  went back in the room and patient is  sleeping with his significant other in the stretcher in no distress.  His pulse ox is 96% on nasal cannula oxygen.  He states he does not have pain unless he moves certain ways.  He was given IV Toradol and oral Flexeril for his pain and then the nurse is going to turn off his oxygen to make sure he does not become hypoxic again.  Patient states he has CPAP at home but he does not have oxygen.  He has had multiple visits and admissions to the hospital for pneumonia and hypoxia.  5:00 AM patient has been off his oxygen for at least 1/2-hour.  He maintained his oxygen in the mid to low 90s however at times he does drop down to 88 however he has sleep apnea and sleeps with a CPAP so that would have been prevented with his CPAP being in place.  At this point I felt patient could be discharged home.  Patient has a known history of narcotic abuse.   Final Clinical Impression(s) / ED Diagnoses Final diagnoses:  Chest wall contusion, left, subsequent encounter    Rx / DC Orders ED Discharge Orders         Ordered    methocarbamol (ROBAXIN) 500 MG tablet     Discontinue  Reprint     02/17/20 0505          Plan discharge  Devoria Albe, MD, Concha Pyo, MD 02/17/20 [phone]

## 2020-03-26 ENCOUNTER — Other Ambulatory Visit: Payer: Self-pay

## 2020-03-26 ENCOUNTER — Encounter: Payer: Self-pay | Admitting: Orthopaedic Surgery

## 2020-03-26 ENCOUNTER — Ambulatory Visit: Payer: 59

## 2020-03-26 ENCOUNTER — Ambulatory Visit: Payer: 59 | Admitting: Orthopaedic Surgery

## 2020-03-26 VITALS — BP 124/85 | HR 76 | Ht 72.0 in | Wt 202.2 lb

## 2020-03-26 DIAGNOSIS — M25512 Pain in left shoulder: Secondary | ICD-10-CM | POA: Diagnosis not present

## 2020-03-26 DIAGNOSIS — G8929 Other chronic pain: Secondary | ICD-10-CM

## 2020-03-26 MED ORDER — NAPROXEN 500 MG PO TABS: 500.0000 mg | ORAL_TABLET | Freq: Two times a day (BID) | ORAL | 5 refills | Status: DC

## 2020-03-26 NOTE — Patient Instructions (Signed)
Shoulder Exercises Ask your health care provider which exercises are safe for you. Do exercises exactly as told by your health care provider and adjust them as directed. It is normal to feel mild stretching, pulling, tightness, or discomfort as you do these exercises. Stop right away if you feel sudden pain or your pain gets worse. Do not begin these exercises until told by your health care provider. Stretching exercises External rotation and abduction This exercise is sometimes called corner stretch. This exercise rotates your arm outward (external rotation) and moves your arm out from your body (abduction). 1. Stand in a doorway with one of your feet slightly in front of the other. This is called a staggered stance. If you cannot reach your forearms to the door frame, stand facing a corner of a room. 2. Choose one of the following positions as told by your health care provider: ? Place your hands and forearms on the door frame above your head. ? Place your hands and forearms on the door frame at the height of your head. ? Place your hands on the door frame at the height of your elbows. 3. Slowly move your weight onto your front foot until you feel a stretch across your chest and in the front of your shoulders. Keep your head and chest upright and keep your abdominal muscles tight. 4. Hold for __________ seconds. 5. To release the stretch, shift your weight to your back foot. Repeat __________ times. Complete this exercise __________ times a day. Extension, standing 1. Stand and hold a broomstick, a cane, or a similar object behind your back. ? Your hands should be a little wider than shoulder width apart. ? Your palms should face away from your back. 2. Keeping your elbows straight and your shoulder muscles relaxed, move the stick away from your body until you feel a stretch in your shoulders (extension). ? Avoid shrugging your shoulders while you move the stick. Keep your shoulder blades tucked  down toward the middle of your back. 3. Hold for __________ seconds. 4. Slowly return to the starting position. Repeat __________ times. Complete this exercise __________ times a day. Range-of-motion exercises Pendulum  1. Stand near a wall or a surface that you can hold onto for balance. 2. Bend at the waist and let your left / right arm hang straight down. Use your other arm to support you. Keep your back straight and do not lock your knees. 3. Relax your left / right arm and shoulder muscles, and move your hips and your trunk so your left / right arm swings freely. Your arm should swing because of the motion of your body, not because you are using your arm or shoulder muscles. 4. Keep moving your hips and trunk so your arm swings in the following directions, as told by your health care provider: ? Side to side. ? Forward and backward. ? In clockwise and counterclockwise circles. 5. Continue each motion for __________ seconds, or for as long as told by your health care provider. 6. Slowly return to the starting position. Repeat __________ times. Complete this exercise __________ times a day. Shoulder flexion, standing  1. Stand and hold a broomstick, a cane, or a similar object. Place your hands a little more than shoulder width apart on the object. Your left / right hand should be palm up, and your other hand should be palm down. 2. Keep your elbow straight and your shoulder muscles relaxed. Push the stick up with your healthy arm to   raise your left / right arm in front of your body, and then over your head until you feel a stretch in your shoulder (flexion). ? Avoid shrugging your shoulder while you raise your arm. Keep your shoulder blade tucked down toward the middle of your back. 3. Hold for __________ seconds. 4. Slowly return to the starting position. Repeat __________ times. Complete this exercise __________ times a day. Shoulder abduction, standing 1. Stand and hold a broomstick,  a cane, or a similar object. Place your hands a little more than shoulder width apart on the object. Your left / right hand should be palm up, and your other hand should be palm down. 2. Keep your elbow straight and your shoulder muscles relaxed. Push the object across your body toward your left / right side. Raise your left / right arm to the side of your body (abduction) until you feel a stretch in your shoulder. ? Do not raise your arm above shoulder height unless your health care provider tells you to do that. ? If directed, raise your arm over your head. ? Avoid shrugging your shoulder while you raise your arm. Keep your shoulder blade tucked down toward the middle of your back. 3. Hold for __________ seconds. 4. Slowly return to the starting position. Repeat __________ times. Complete this exercise __________ times a day. Internal rotation  1. Place your left / right hand behind your back, palm up. 2. Use your other hand to dangle an exercise band, a towel, or a similar object over your shoulder. Grasp the band with your left / right hand so you are holding on to both ends. 3. Gently pull up on the band until you feel a stretch in the front of your left / right shoulder. The movement of your arm toward the center of your body is called internal rotation. ? Avoid shrugging your shoulder while you raise your arm. Keep your shoulder blade tucked down toward the middle of your back. 4. Hold for __________ seconds. 5. Release the stretch by letting go of the band and lowering your hands. Repeat __________ times. Complete this exercise __________ times a day. Strengthening exercises External rotation  1. Sit in a stable chair without armrests. 2. Secure an exercise band to a stable object at elbow height on your left / right side. 3. Place a soft object, such as a folded towel or a small pillow, between your left / right upper arm and your body to move your elbow about 4 inches (10 cm) away  from your side. 4. Hold the end of the exercise band so it is tight and there is no slack. 5. Keeping your elbow pressed against the soft object, slowly move your forearm out, away from your abdomen (external rotation). Keep your body steady so only your forearm moves. 6. Hold for __________ seconds. 7. Slowly return to the starting position. Repeat __________ times. Complete this exercise __________ times a day. Shoulder abduction  1. Sit in a stable chair without armrests, or stand up. 2. Hold a __________ weight in your left / right hand, or hold an exercise band with both hands. 3. Start with your arms straight down and your left / right palm facing in, toward your body. 4. Slowly lift your left / right hand out to your side (abduction). Do not lift your hand above shoulder height unless your health care provider tells you that this is safe. ? Keep your arms straight. ? Avoid shrugging your shoulder while you   do this movement. Keep your shoulder blade tucked down toward the middle of your back. 5. Hold for __________ seconds. 6. Slowly lower your arm, and return to the starting position. Repeat __________ times. Complete this exercise __________ times a day. Shoulder extension 1. Sit in a stable chair without armrests, or stand up. 2. Secure an exercise band to a stable object in front of you so it is at shoulder height. 3. Hold one end of the exercise band in each hand. Your palms should face each other. 4. Straighten your elbows and lift your hands up to shoulder height. 5. Step back, away from the secured end of the exercise band, until the band is tight and there is no slack. 6. Squeeze your shoulder blades together as you pull your hands down to the sides of your thighs (extension). Stop when your hands are straight down by your sides. Do not let your hands go behind your body. 7. Hold for __________ seconds. 8. Slowly return to the starting position. Repeat __________ times.  Complete this exercise __________ times a day. Shoulder row 1. Sit in a stable chair without armrests, or stand up. 2. Secure an exercise band to a stable object in front of you so it is at waist height. 3. Hold one end of the exercise band in each hand. Position your palms so that your thumbs are facing the ceiling (neutral position). 4. Bend each of your elbows to a 90-degree angle (right angle) and keep your upper arms at your sides. 5. Step back until the band is tight and there is no slack. 6. Slowly pull your elbows back behind you. 7. Hold for __________ seconds. 8. Slowly return to the starting position. Repeat __________ times. Complete this exercise __________ times a day. Shoulder press-ups  1. Sit in a stable chair that has armrests. Sit upright, with your feet flat on the floor. 2. Put your hands on the armrests so your elbows are bent and your fingers are pointing forward. Your hands should be about even with the sides of your body. 3. Push down on the armrests and use your arms to lift yourself off the chair. Straighten your elbows and lift yourself up as much as you comfortably can. ? Move your shoulder blades down, and avoid letting your shoulders move up toward your ears. ? Keep your feet on the ground. As you get stronger, your feet should support less of your body weight as you lift yourself up. 4. Hold for __________ seconds. 5. Slowly lower yourself back into the chair. Repeat __________ times. Complete this exercise __________ times a day. Wall push-ups  1. Stand so you are facing a stable wall. Your feet should be about one arm-length away from the wall. 2. Lean forward and place your palms on the wall at shoulder height. 3. Keep your feet flat on the floor as you bend your elbows and lean forward toward the wall. 4. Hold for __________ seconds. 5. Straighten your elbows to push yourself back to the starting position. Repeat __________ times. Complete this exercise  __________ times a day. This information is not intended to replace advice given to you by your health care provider. Make sure you discuss any questions you have with your health care provider. Document Revised: 11/25/2018 Document Reviewed: 09/02/2018 Elsevier Patient Education  2020 [employer]  

## 2020-03-26 NOTE — Progress Notes (Signed)
Subjective:    Patient ID: Christian Ellison, male    DOB: [birth year], 59 y.o.   MRN: [id number]  HPI He has had pain in the left shoulder getting worse over the last three months.  He has pain with overhead use and abduction.  He has no trauma, no numbness,no swelling. He has had rotator cuff surgery on the right side.  He feels he has rotator cuff tear on the left now.  He has tried heat, ice, Advil and rubs with no help.   Review of Systems  Constitutional: Positive for activity change.  Musculoskeletal: Positive for arthralgias.  Psychiatric/Behavioral: The patient is nervous/anxious.   All other systems reviewed and are negative.  For Review of Systems, all other systems reviewed and are negative.  The following is a summary of the past history medically, past history surgically, known current medicines, social history and family history.  This information is gathered electronically by the computer from prior information and documentation.  I review this each visit and have found including this information at this point in the chart is beneficial and informative.   Past Medical History:  Diagnosis Date  . Anxiety   . Aspiration pneumonia (HCC) 05/26/2018   RLL  . Chronic pain   . Esophageal stricture   . GERD (gastroesophageal reflux disease)   . 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   Marland Kitchen Opiate abuse, continuous (HCC)     Past Surgical History:  Procedure Laterality Date  . BIOPSY  03/18/2018   Procedure: BIOPSY;  Surgeon: Malissa Hippo, MD;  Location: [facility];  Service: Endoscopy;;  gastric   . COLONOSCOPY N/A 11/22/2013   Dr. Karilyn Cota: Normal except for hemorrhoids.  Next colonoscopy 10 years.  . ESOPHAGEAL DILATION N/A 03/18/2018   Procedure: ESOPHAGEAL DILATION;  Surgeon: Malissa Hippo, MD;  Location: [facility];  Service: Endoscopy;  Laterality: N/A;  . ESOPHAGEAL MANOMETRY N/A 06/15/2018   Procedure: ESOPHAGEAL MANOMETRY (EM);  Surgeon: Napoleon Form, MD;  Location: [facility];  Service: Endoscopy;  Laterality: N/A;  . ESOPHAGOGASTRODUODENOSCOPY (EGD) WITH PROPOFOL N/A 03/18/2018   Dr. Karilyn Cota: Benign-appearing mild esophageal stenosis proximal to the GE J, status post dilation.  2 cm hiatal hernia.  Gastritis, biopsies negative for H. pylori  . Fracture Right Leg     Patient has rod/screws in this leg  . Rotor Cuff  2012   Right Shoulder    Current Outpatient Medications on File Prior to Visit  Medication Sig Dispense Refill  . acetaminophen (TYLENOL) 325 MG tablet Take 2 tablets (650 mg total) by mouth every 6 (six) hours as needed for mild pain (or Fever >/= 101). 30 tablet 1  . albuterol (VENTOLIN HFA) 108 (90 Base) MCG/ACT inhaler Inhale 2 puffs into the lungs every 6 (six) hours as needed for wheezing or shortness of breath (cough). 18 g 1  . ALPRAZolam (XANAX) 1 MG tablet Take 1 mg by mouth 4 (four) times daily as needed for anxiety.     Marland Kitchen aspirin EC 81 MG tablet Take 1 tablet (81 mg total) by mouth daily with breakfast. (Patient taking differently: Take 81 mg by mouth at bedtime. ) 30 tablet 2  . citalopram (CELEXA) 40 MG tablet Take 40 mg by mouth daily as needed (for mood).     Marland Kitchen diltiazem (CARDIZEM CD) 120 MG 24 hr capsule Take 1 capsule (120 mg total) by mouth daily. 30 capsule 5  . Glucosamine HCl (  GLUCOSAMINE PO) Take 1 tablet by mouth at bedtime.     Marland Kitchen guaiFENesin-dextromethorphan (ROBITUSSIN DM) 100-10 MG/5ML syrup Take 10 mLs by mouth every 6 (six) hours as needed for cough. 118 mL 0  . levothyroxine (SYNTHROID, LEVOTHROID) 25 MCG tablet Take 25 mcg by mouth daily.  11  . Multiple Vitamin (MULTIVITAMIN WITH MINERALS) TABS tablet Take 1 tablet by mouth daily. 50+    . pantoprazole (PROTONIX) 40 MG tablet Take 1 tablet (40 mg total) by mouth 2 (two) times daily. 60 tablet 2  . cephALEXin (KEFLEX) 500 MG capsule Take 1 capsule (500 mg total) by mouth 4 (four) times daily. (Patient not taking: Reported on 03/26/2020) 20  capsule 0  . doxycycline (VIBRAMYCIN) 100 MG capsule Take 1 capsule (100 mg total) by mouth 2 (two) times daily. (Patient not taking: Reported on 03/26/2020) 20 capsule 0  . methocarbamol (ROBAXIN) 500 MG tablet Take 1 or 2 po Q 6hrs for chest wall pain (Patient not taking: Reported on 03/26/2020) 60 tablet 0  . mupirocin ointment (BACTROBAN) 2 % Apply 1 application topically 2 (two) times daily. (Patient not taking: Reported on 03/26/2020) 22 g 0   No current facility-administered medications on file prior to visit.    Social History   Socioeconomic History  . Marital status: Married    Spouse name: Not on file  . Number of children: Not on file  . Years of education: Not on file  . Highest education level: Not on file  Occupational History  . Occupation: unemployed  Tobacco Use  . Smoking status: Never Smoker  . Smokeless tobacco: Never Used  Vaping Use  . Vaping Use: Never used  Substance and Sexual Activity  . Alcohol use: Not Currently  . Drug use: Not Currently    Types: Benzodiazepines    Comment: opiates  . Sexual activity: Not on file  Other Topics Concern  . Not on file  Social History Narrative  . Not on file   Social Determinants of Health   Financial Resource Strain:   . Difficulty of Paying Living Expenses:   Food Insecurity:   . Worried About Programme researcher, broadcasting/film/video in the Last Year:   . Barista in the Last Year:   Transportation Needs:   . Freight forwarder (Medical):   Marland Kitchen Lack of Transportation (Non-Medical):   Physical Activity:   . Days of Exercise per Week:   . Minutes of Exercise per Session:   Stress:   . Feeling of Stress :   Social Connections:   . Frequency of Communication with Friends and Family:   . Frequency of Social Gatherings with Friends and Family:   . Attends Religious Services:   . Active Member of Clubs or Organizations:   . Attends Banker Meetings:   Marland Kitchen Marital Status:   Intimate Partner Violence:   .  Fear of Current or Ex-Partner:   . Emotionally Abused:   Marland Kitchen Physically Abused:   . Sexually Abused:     Family History  Problem Relation Age of Onset  . Breast cancer Mother   . Colon cancer Father   . Bladder Cancer Father   . Prostate cancer Father   . Hypertension Sister   . Hypothyroidism Sister   . Healthy Sister   . Healthy Daughter   . Healthy Son   . Healthy Son     BP 124/85   Pulse 76   Ht 6' (1.829 m)  Wt 202 lb 4 oz (91.7 kg)   BMI 27.43 kg/m   Body mass index is 27.43 kg/m.     Objective:   Physical Exam Vitals and nursing note reviewed.  Constitutional:      Appearance: He is well-developed.  HENT:     Head: Normocephalic and atraumatic.  Eyes:     Conjunctiva/sclera: Conjunctivae normal.     Pupils: Pupils are equal, round, and reactive to light.  Cardiovascular:     Rate and Rhythm: Normal rate and regular rhythm.  Pulmonary:     Effort: Pulmonary effort is normal.  Abdominal:     Palpations: Abdomen is soft.  Musculoskeletal:       Arms:     Cervical back: Normal range of motion and neck supple.  Skin:    General: Skin is warm and dry.  Neurological:     Mental Status: He is alert and oriented to person, place, and time.     Cranial Nerves: No cranial nerve deficit.     Motor: No abnormal muscle tone.     Coordination: Coordination normal.     Deep Tendon Reflexes: Reflexes are normal and symmetric. Reflexes normal.  Psychiatric:        Behavior: Behavior normal.        Thought Content: Thought content normal.        Judgment: Judgment normal.    X-rays were done of the left shoulder, reported separately.       Assessment & Plan:   Encounter Diagnosis  Name Primary?  . Chronic left shoulder pain Yes   I will begin Naprosyn 500 po bid pc.  PROCEDURE NOTE:  The patient request injection, verbal consent was obtained.  The left shoulder was prepped appropriately after time out was performed.   Sterile technique was  observed and injection of 1 cc of Depo-Medrol 40 mg with several cc's of plain xylocaine. Anesthesia was provided by ethyl chloride and a 20-gauge needle was used to inject the shoulder area. A posterior approach was used.  The injection was tolerated well.  A band aid dressing was applied.  The patient was advised to apply ice later today and tomorrow to the injection sight as needed.  He will most likely need a MRI.  I have given exercises to do.  Return in one month.  Consider MRI then.  Call if any problem.  Precautions discussed.   Electronically Signed Darreld Mclean, MD 8/10/202111:17 AM

## 2020-04-21 ENCOUNTER — Encounter (HOSPITAL_COMMUNITY): Payer: Self-pay

## 2020-04-21 ENCOUNTER — Emergency Department (HOSPITAL_COMMUNITY): Payer: 59

## 2020-04-21 ENCOUNTER — Other Ambulatory Visit: Payer: Self-pay

## 2020-04-21 ENCOUNTER — Emergency Department (HOSPITAL_COMMUNITY)
Admission: EM | Admit: 2020-04-21 | Discharge: 2020-04-21 | Disposition: A | Discharge status: Home / Self Care | Payer: 59 | Attending: Emergency Medicine | Admitting: Emergency Medicine

## 2020-04-21 DIAGNOSIS — Z7982 Long term (current) use of aspirin: Secondary | ICD-10-CM | POA: Insufficient documentation

## 2020-04-21 DIAGNOSIS — Z20822 Contact with and (suspected) exposure to covid-19: Secondary | ICD-10-CM | POA: Diagnosis not present

## 2020-04-21 DIAGNOSIS — I509 Heart failure, unspecified: Secondary | ICD-10-CM | POA: Insufficient documentation

## 2020-04-21 DIAGNOSIS — R400 Somnolence: Secondary | ICD-10-CM | POA: Insufficient documentation

## 2020-04-21 DIAGNOSIS — Z79899 Other long term (current) drug therapy: Secondary | ICD-10-CM | POA: Diagnosis not present

## 2020-04-21 DIAGNOSIS — E039 Hypothyroidism, unspecified: Secondary | ICD-10-CM | POA: Insufficient documentation

## 2020-04-21 DIAGNOSIS — R0602 Shortness of breath: Secondary | ICD-10-CM | POA: Diagnosis present

## 2020-04-21 DIAGNOSIS — I110 Hypertensive heart disease with heart failure: Secondary | ICD-10-CM | POA: Insufficient documentation

## 2020-04-21 LAB — BASIC METABOLIC PANEL
Anion gap: 10 (ref 5–15)
BUN: 16 mg/dL (ref 6–20)
CO2: 30 mmol/L (ref 22–32)
Calcium: 8.5 mg/dL — ABNORMAL LOW (ref 8.9–10.3)
Chloride: 100 mmol/L (ref 98–111)
Creatinine, Ser: 0.81 mg/dL (ref 0.61–1.24)
GFR calc Af Amer: >60 mL/min (ref >60)
GFR calc non Af Amer: >60 mL/min (ref >60)
Glucose, Bld: 109 mg/dL — ABNORMAL HIGH (ref 70–99)
Potassium: 4.4 mmol/L (ref 3.5–5.1)
Sodium: 140 mmol/L (ref 135–145)

## 2020-04-21 LAB — SARS CORONAVIRUS 2 BY RT PCR (HOSPITAL ORDER, PERFORMED IN ~~LOC~~ HOSPITAL LAB): SARS Coronavirus 2: NEGATIVE

## 2020-04-21 LAB — BRAIN NATRIURETIC PEPTIDE: B Natriuretic Peptide: 23 pg/mL (ref 0.0–100.0)

## 2020-04-21 MED ORDER — NALOXONE HCL 4 MG/0.1ML NA LIQD
1.0000 | Freq: Once | NASAL | Status: DC
  Filled 2020-04-21: qty 4

## 2020-04-21 NOTE — Discharge Instructions (Addendum)
You have been evaluated for your drowsiness.  Please avoid any medication that can cause drowsiness such as pain medication or antianxiety medication.  Continue using your CPAP at home and get adequate rest.  Follow-up with your doctor for further care.  Return if you have any concern.

## 2020-04-21 NOTE — ED Triage Notes (Addendum)
Pt brought to ED via [facility], pt wife called EMS bc his O2 was running 87-88%. Pt states he got choked up the other night eating and has been getting worse since. Pt started on 6L per [place]. Pt with slurred speech and drowsy in triage. Pt denies narcotic use but states he took xanax approx 1 hour PTA

## 2020-04-21 NOTE — ED Notes (Signed)
Ambulated pt per orders. Pt O2 90% on RA. Pt denied being SOB.

## 2020-04-21 NOTE — ED Provider Notes (Signed)
[facility] EMERGENCY DEPARTMENT Provider Note   CSN: [id number] Arrival date & time: 04/21/20  1254     History Chief Complaint  Patient presents with  . Shortness of Breath    Christian Ellison is a 59 y.o. male.  The history is provided by the patient, the EMS personnel and medical records. No language interpreter was used.  Shortness of Breath    59 year old male significant history of recurrent aspiration pneumonia, esophageal stricture, polysubstance abuse, CHF, depression, brought here via EMS from home for evaluation of shortness of breath.  Patient states he is not really having shortness of breath, but states his primary complaint is just overall feeling tired.  Patient mention he is going through a lot in his life of losing multiple family members.  He is also is in the process of going on honeymoon.  Report having history of obstructive sleep apnea that he sleeps with a CPAP.  He feels he is not getting adequate sleep and has been feeling tired for the past week.  He is here at the urging of his wife.  Patient states he has had Covid vaccination.  He denies any associated fever or chills.  Denies cough, runny nose sneezing sore throat loss of taste or smell.  He denies SI or HI.  He denies self-medicating with drugs or alcohol.  But states that he is going through a lot in his life.  He denies taking opiate but states he does take his usual Xanax that was prescribed to him.  He denies any recent sick contact.  Past Medical History:  Diagnosis Date  . Anxiety   . Aspiration pneumonia (HCC) 05/26/2018   RLL  . Chronic pain   . Esophageal stricture   . GERD (gastroesophageal reflux disease)   . 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   Marland Kitchen Opiate abuse, continuous [facility])     Patient Active Problem List   Diagnosis Date Noted  . Pneumonia 06/24/2019  . Pneumonitis 06/24/2019  . Aspiration pneumonitis (HCC) 06/10/2019  . Closed fracture of xiphoid process   . PNA  (pneumonia) 05/15/2019  . Multifocal pneumonia 03/11/2019  . Acute respiratory failure with hypoxemia (HCC) 03/11/2019  . Sinus tachycardia 03/11/2019  . Sepsis due to pneumonia (HCC) 03/11/2019  . Hypothyroidism 03/11/2019  . Bronchopneumonia 11/06/2018  . Acute Respiratory failure with hypoxia (2/2 Asp PNA) 11/06/2018  . Recurrent aspiration pneumonia (HCC)   . Fever 06/09/2018  . Tachycardia 06/09/2018  . Hypoxia 06/09/2018  . Hypotension 06/09/2018  . Esophageal motility disorder   . Aspiration pneumonia of right lower lobe (HCC)   . Sepsis (HCC) 05/26/2018  . Pulmonary edema 02/02/2018  . HCAP (healthcare-associated pneumonia) 02/02/2018  . Weakness generalized 02/02/2018  . Hypertension 02/02/2018  . CHF (congestive heart failure) (HCC) 02/02/2018  . Nausea & vomiting 02/02/2018  . Dysphagia 02/02/2018  . Esophageal stricture/stenosis/esophageal dysmotility with recurrent aspiration 02/02/2018  . Esophageal stenosis 02/02/2018  . GERD (gastroesophageal reflux disease) 02/02/2018  . Polysubstance abuse (HCC) 02/02/2018  . Acute respiratory failure with hypoxia (HCC)   . Transaminitis   . Elevated troponin   . Overdose 01/02/2018  . Hx of hepatitis C 01/17/2013  . Depression 01/17/2013  . Anxiety with depression 01/17/2013  . Right knee pain 01/17/2013    Past Surgical History:  Procedure Laterality Date  . BIOPSY  03/18/2018   Procedure: BIOPSY;  Surgeon: Malissa Hippo, MD;  [facility];  Service: Endoscopy;;  gastric   .  COLONOSCOPY N/A 11/22/2013   Dr. Karilyn Cotaehman: Normal except for hemorrhoids.  Next colonoscopy 10 years.  . ESOPHAGEAL DILATION N/A 03/18/2018   Procedure: ESOPHAGEAL DILATION;  Surgeon: Malissa Hippoehman, Najeeb U, MD;  Location: [facility];  Service: Endoscopy;  Laterality: N/A;  . ESOPHAGEAL MANOMETRY N/A 06/15/2018   Procedure: ESOPHAGEAL MANOMETRY (EM);  Surgeon: Napoleon FormNandigam, Kavitha V, MD;  Location: [facility];  Service: Endoscopy;  Laterality:  N/A;  . ESOPHAGOGASTRODUODENOSCOPY (EGD) WITH PROPOFOL N/A 03/18/2018   Dr. Karilyn Cotaehman: Benign-appearing mild esophageal stenosis proximal to the GE J, status post dilation.  2 cm hiatal hernia.  Gastritis, biopsies negative for H. pylori  . Fracture Right Leg     Patient has rod/screws in this leg  . Rotor Cuff  2012   Right Shoulder       Family History  Problem Relation Age of Onset  . Breast cancer Mother   . Colon cancer Father   . Bladder Cancer Father   . Prostate cancer Father   . Hypertension Sister   . Hypothyroidism Sister   . Healthy Sister   . Healthy Daughter   . Healthy Son   . Healthy Son     Social History   Tobacco Use  . Smoking status: Never Smoker  . Smokeless tobacco: Never Used  Vaping Use  . Vaping Use: Never used  Substance Use Topics  . Alcohol use: Not Currently  . Drug use: Not Currently    Types: Benzodiazepines    Comment: opiates    Home Medications Prior to Admission medications   Medication Sig Start Date End Date Taking? Authorizing Provider  acetaminophen (TYLENOL) 325 MG tablet Take 2 tablets (650 mg total) by mouth every 6 (six) hours as needed for mild pain (or Fever >/= 101). 05/17/19   Shon HaleEmokpae, Courage, MD  albuterol (VENTOLIN HFA) 108 (90 Base) MCG/ACT inhaler Inhale 2 puffs into the lungs every 6 (six) hours as needed for wheezing or shortness of breath (cough). 05/17/19   Shon HaleEmokpae, Courage, MD  ALPRAZolam Prudy Feeler(XANAX) 1 MG tablet Take 1 mg by mouth 4 (four) times daily as needed for anxiety.  10/22/19   [provider]  aspirin EC 81 MG tablet Take 1 tablet (81 mg total) by mouth daily with breakfast. Patient taking differently: Take 81 mg by mouth at bedtime.  03/13/19   Shon HaleEmokpae, Courage, MD  cephALEXin (KEFLEX) 500 MG capsule Take 1 capsule (500 mg total) by mouth 4 (four) times daily. Patient not taking: Reported on 03/26/2020 02/13/20   Durward ParcelAvegno, Komlanvi S, FNP  citalopram (CELEXA) 40 MG tablet Take 40 mg by mouth daily as needed  (for mood).  10/23/19   [provider]  diltiazem (CARDIZEM CD) 120 MG 24 hr capsule Take 1 capsule (120 mg total) by mouth daily. 05/17/19 05/16/20  Shon HaleEmokpae, Courage, MD  doxycycline (VIBRAMYCIN) 100 MG capsule Take 1 capsule (100 mg total) by mouth 2 (two) times daily. Patient not taking: Reported on 03/26/2020 01/29/20   Durward ParcelAvegno, Komlanvi S, FNP  Glucosamine HCl (GLUCOSAMINE PO) Take 1 tablet by mouth at bedtime.     [provider]  guaiFENesin-dextromethorphan (ROBITUSSIN DM) 100-10 MG/5ML syrup Take 10 mLs by mouth every 6 (six) hours as needed for cough. 10/30/19   Sherryll BurgerShah, Pratik D, DO  levothyroxine (SYNTHROID, LEVOTHROID) 25 MCG tablet Take 25 mcg by mouth daily. 05/13/18   [provider]  methocarbamol (ROBAXIN) 500 MG tablet Take 1 or 2 po Q 6hrs for chest wall pain Patient not taking:  Reported on 03/26/2020 02/17/20   Devoria Albe, MD  Multiple Vitamin (MULTIVITAMIN WITH MINERALS) TABS tablet Take 1 tablet by mouth daily. 50+    [provider]  mupirocin ointment (BACTROBAN) 2 % Apply 1 application topically 2 (two) times daily. Patient not taking: Reported on 03/26/2020 02/13/20   Durward Parcel, FNP  naproxen (NAPROSYN) 500 MG tablet Take 1 tablet (500 mg total) by mouth 2 (two) times daily with a meal. 03/26/20   Darreld Mclean, MD  pantoprazole (PROTONIX) 40 MG tablet Take 1 tablet (40 mg total) by mouth 2 (two) times daily. 05/17/19 05/16/20  Shon Hale, MD    Allergies    Patient has no known allergies.  Review of Systems   Review of Systems  Respiratory: Positive for shortness of breath.   All other systems reviewed and are negative.   Physical Exam Updated Vital Signs BP 111/68 (BP Location: Right Arm)   Pulse 94   Temp 98 F (36.7 C) (Oral)   Resp (!) 22   Ht 6' (1.829 m)   Wt 90.7 kg   SpO2 95%   BMI 27.12 kg/m   Physical Exam Vitals and nursing note reviewed.  Constitutional:      General: He is not in acute distress.     Appearance: He is well-developed.     Comments: Patient appears drowsy but easily arousable and answer question appropriately.  HENT:     Head: Atraumatic.  Eyes:     Conjunctiva/sclera: Conjunctivae normal.  Cardiovascular:     Rate and Rhythm: Normal rate and regular rhythm.  Pulmonary:     Effort: Pulmonary effort is normal.     Breath sounds: Decreased breath sounds and rales (Faint crackles heard at lung bases.) present.  Chest:     Chest wall: No tenderness.  Musculoskeletal:     Cervical back: Neck supple.     Right lower leg: No edema.     Left lower leg: No edema.  Skin:    Findings: No rash.  Neurological:     Mental Status: He is oriented to person, place, and time.     GCS: GCS eye subscore is 4. GCS verbal subscore is 5. GCS motor subscore is 6.     Cranial Nerves: Cranial nerves are intact.  Psychiatric:        Mood and Affect: Mood normal.     ED Results / Procedures / Treatments   Labs (all labs ordered are listed, but only abnormal results are displayed) Labs Reviewed  BASIC METABOLIC PANEL - Abnormal; Notable for the following components:      Result Value   Glucose, Bld 109 (*)    Calcium 8.5 (*)    All other components within normal limits  SARS CORONAVIRUS 2 BY RT PCR (HOSPITAL ORDER, PERFORMED IN [facility])  BRAIN NATRIURETIC PEPTIDE  RAPID URINE DRUG SCREEN, HOSP PERFORMED    EKG None  ED ECG REPORT   Date: 04/21/2020  Rate: 94  Rhythm: normal sinus rhythm  QRS Axis: normal  Intervals: QT prolonged  ST/T Wave abnormalities: nonspecific T wave changes  Conduction Disutrbances:nonspecific intraventricular conduction delay  Narrative Interpretation:   Old EKG Reviewed: unchanged  I have personally reviewed the EKG tracing and agree with the computerized printout as noted.   Radiology DG Chest Portable 1 View  Result Date: 04/21/2020 CLINICAL DATA:  Shortness of breath, COVID-19. EXAM: PORTABLE CHEST 1 VIEW COMPARISON:   February 17, 2020. FINDINGS: The heart size  and mediastinal contours are within normal limits. No pneumothorax or pleural effusion is noted. Right lung is clear. Minimal left basilar subsegmental atelectasis is noted. The visualized skeletal structures are unremarkable. IMPRESSION: Minimal left basilar subsegmental atelectasis. Electronically Signed   By: Lupita Raider M.D.   On: 04/21/2020 14:02    Procedures Procedures (including critical care time)  Medications Ordered in ED Medications - No data to display  ED Course  I have reviewed the triage vital signs and the nursing notes.  Pertinent labs & imaging results that were available during my care of the patient were reviewed by me and considered in my medical decision making (see chart for details).    MDM Rules/Calculators/A&P                          BP 131/77   Pulse 96   Temp 98 F (36.7 C) (Oral)   Resp 15   Ht 6' (1.829 m)   Wt 90.7 kg   SpO2 92%   BMI 27.12 kg/m   Final Clinical Impression(s) / ED Diagnoses Final diagnoses:  Somnolence    Rx / DC Orders ED Discharge Orders    None     1:40 PM Patient with history of obstructive apnea on CPAP here with complaints of feeling drowsiness, not in adequate sleep and was here at the urging of his wife because while he was sleeping his O2 sats was "low".  He denies any active shortness of breath or cough.  He has been vaccinated for COVID-19.  He does have known history of opiates and benzos abuse.  Patient appears drowsy on my exam I suspect this is likely secondary to benzodiazepine use or possibly due to lack of sleep.  Work-up initiated. ] 3:52 PM Labs are reassuring, COVID-19 test negative, normal BNP, chest x-ray unremarkable, O2 sats improving without any intervention.  Patient now more alert, satting at 92% on room air.  He request to go home.  He refused UDS.  Encourage patient to avoid opiate and benzodiazepine use as it can contribute to his symptoms.  Return  precaution discussed.  He is stable for discharge.  Christian Ellison was evaluated in Emergency Department on 04/21/2020 for the symptoms described in the history of present illness. He was evaluated in the context of the global COVID-19 pandemic, which necessitated consideration that the patient might be at risk for infection with the SARS-CoV-2 virus that causes COVID-19. Institutional protocols and algorithms that pertain to the evaluation of patients at risk for COVID-19 are in a state of rapid change based on information released by regulatory bodies including the CDC and federal and state organizations. These policies and algorithms were followed during the patient's care in the ED.    Fayrene Helper, PA-C 04/21/20 1556    Bethann Berkshire, MD 04/23/20 [phone]

## 2020-04-21 NOTE — ED Notes (Signed)
Pt sitting up in bed upon entering room. Pt sats 92% on RA.

## 2020-05-01 ENCOUNTER — Encounter: Payer: Self-pay | Admitting: Orthopaedic Surgery

## 2020-05-01 ENCOUNTER — Ambulatory Visit (INDEPENDENT_AMBULATORY_CARE_PROVIDER_SITE_OTHER): Payer: 59 | Admitting: Orthopaedic Surgery

## 2020-05-01 ENCOUNTER — Other Ambulatory Visit: Payer: Self-pay

## 2020-05-01 VITALS — BP 152/91 | HR 61 | Ht 72.0 in | Wt 206.0 lb

## 2020-05-01 DIAGNOSIS — G8929 Other chronic pain: Secondary | ICD-10-CM | POA: Diagnosis not present

## 2020-05-01 DIAGNOSIS — M25512 Pain in left shoulder: Secondary | ICD-10-CM

## 2020-05-01 NOTE — Progress Notes (Signed)
I am better  He has less pain of the left shoulder.  The injection helped a lot and he is taking the Naprosyn.  He has changed the way he sleeps with his wife at night and that has helped as well.    He has full motion on the left shoulder but some tenderness in the extremes.    I have told him to call me if he gets increased pain.  I will not order MRI at this time as he has improved.  Encounter Diagnosis  Name Primary?   Chronic left shoulder pain Yes   To call.  Electronically Signed Darreld Mclean, MD 9/15/20219:54 AM

## 2020-06-12 DIAGNOSIS — F1911 Other psychoactive substance abuse, in remission: Secondary | ICD-10-CM | POA: Insufficient documentation

## 2020-06-12 DIAGNOSIS — F419 Anxiety disorder, unspecified: Secondary | ICD-10-CM | POA: Insufficient documentation

## 2020-06-12 DIAGNOSIS — G4733 Obstructive sleep apnea (adult) (pediatric): Secondary | ICD-10-CM | POA: Insufficient documentation

## 2020-06-12 DIAGNOSIS — F1320 Sedative, hypnotic or anxiolytic dependence, uncomplicated: Secondary | ICD-10-CM | POA: Insufficient documentation

## 2020-06-20 ENCOUNTER — Emergency Department (HOSPITAL_COMMUNITY): Payer: 59

## 2020-06-20 ENCOUNTER — Emergency Department (HOSPITAL_COMMUNITY)
Admission: EM | Admit: 2020-06-20 | Discharge: 2020-06-21 | Disposition: A | Discharge status: Home / Self Care | Payer: 59 | Attending: Emergency Medicine | Admitting: Emergency Medicine

## 2020-06-20 ENCOUNTER — Encounter (HOSPITAL_COMMUNITY): Payer: Self-pay | Admitting: Emergency Medicine

## 2020-06-20 ENCOUNTER — Other Ambulatory Visit: Payer: Self-pay

## 2020-06-20 DIAGNOSIS — Z7982 Long term (current) use of aspirin: Secondary | ICD-10-CM | POA: Insufficient documentation

## 2020-06-20 DIAGNOSIS — S32010A Wedge compression fracture of first lumbar vertebra, initial encounter for closed fracture: Secondary | ICD-10-CM | POA: Diagnosis not present

## 2020-06-20 DIAGNOSIS — I110 Hypertensive heart disease with heart failure: Secondary | ICD-10-CM | POA: Diagnosis not present

## 2020-06-20 DIAGNOSIS — S9305XA Dislocation of left ankle joint, initial encounter: Secondary | ICD-10-CM

## 2020-06-20 DIAGNOSIS — W11XXXA Fall on and from ladder, initial encounter: Secondary | ICD-10-CM | POA: Diagnosis not present

## 2020-06-20 DIAGNOSIS — E039 Hypothyroidism, unspecified: Secondary | ICD-10-CM | POA: Insufficient documentation

## 2020-06-20 DIAGNOSIS — S92132A Displaced fracture of posterior process of left talus, initial encounter for closed fracture: Secondary | ICD-10-CM | POA: Insufficient documentation

## 2020-06-20 DIAGNOSIS — I509 Heart failure, unspecified: Secondary | ICD-10-CM | POA: Insufficient documentation

## 2020-06-20 DIAGNOSIS — Z79899 Other long term (current) drug therapy: Secondary | ICD-10-CM | POA: Insufficient documentation

## 2020-06-20 DIAGNOSIS — S82892A Other fracture of left lower leg, initial encounter for closed fracture: Secondary | ICD-10-CM

## 2020-06-20 DIAGNOSIS — S99912A Unspecified injury of left ankle, initial encounter: Secondary | ICD-10-CM | POA: Diagnosis present

## 2020-06-20 MED ORDER — OXYCODONE-ACETAMINOPHEN 5-325 MG PO TABS
1.0000 | ORAL_TABLET | Freq: Three times a day (TID) | ORAL | 0 refills | Status: DC | PRN
Start: 2020-06-20 — End: 2020-07-15

## 2020-06-20 MED ORDER — PROPOFOL 10 MG/ML IV BOLUS: 1.0000 mg/kg | Freq: Once | INTRAVENOUS | Status: DC

## 2020-06-20 MED ORDER — OXYCODONE-ACETAMINOPHEN 5-325 MG PO TABS
1.0000 | ORAL_TABLET | Freq: Once | ORAL | Status: AC
  Administered 2020-06-20: 1 via ORAL
  Filled 2020-06-20: qty 1

## 2020-06-20 MED ORDER — PROPOFOL 10 MG/ML IV BOLUS
INTRAVENOUS | Status: AC | PRN
  Administered 2020-06-20: 25 mg via INTRAVENOUS
  Administered 2020-06-20: 50 mg via INTRAVENOUS

## 2020-06-20 MED ORDER — PROPOFOL 10 MG/ML IV BOLUS
INTRAVENOUS | Status: AC
  Filled 2020-06-20: qty 20

## 2020-06-20 MED ORDER — ONDANSETRON HCL 4 MG/2ML IJ SOLN
4.0000 mg | Freq: Once | INTRAMUSCULAR | Status: AC
  Administered 2020-06-20: 4 mg via INTRAVENOUS
  Filled 2020-06-20: qty 2

## 2020-06-20 MED ORDER — MORPHINE SULFATE (PF) 4 MG/ML IV SOLN
4.0000 mg | Freq: Once | INTRAVENOUS | Status: AC
  Administered 2020-06-20: 4 mg via INTRAVENOUS
  Filled 2020-06-20: qty 1

## 2020-06-20 NOTE — Discharge Instructions (Addendum)
As we discussed, it is very important that you elevate the leg to help with swelling.  You should be nonweightbearing on that leg and use crutches as directed.  You will follow-up with referred orthopedic doctor and he will direct you towards further orthopedic care.  As we discussed, your x-ray showed some questionable irregularities on the L1 and L4 that could be age-indeterminate compression fractures.  You are not having pain in that area.  I did discuss with the neurosurgery office.  They stated that you can follow-up with them in their office.  You can take naproxen as directed.  Do not take it more than twice a day.  Take pain medications as directed for break through pain. Do not drive or operate machinery while taking this medication.   Return to the emergency department for any worsening pain, discoloration of your toes, numbness/weakness of your arms or legs, fevers or any other worsening concerning symptoms.

## 2020-06-20 NOTE — Consult Note (Signed)
ORTHOPAEDIC CONSULTATION  REQUESTING PHYSICIAN: Bethann Berkshire, MD  ASSESSMENT AND PLAN: 60 y.o. male with the following: Multiple comminuted fractures of the left talus and associated dislocation of the talonavicular and subtalar joint  Patient will potentially benefit from a surgical procedure.  This can be arranged as an outpatient.  Case will be discussed with trauma and foot/ankle specialists.    - Weight Bearing Status/Activity: NWB LLE  - Additional recommended labs/tests: None  -VTE Prophylaxis: Patient should continue to take aspirin 81 mg daily  - Pain control: PRN  - Follow-up plan: Will schedule appropriate follow up for his injuries.   -Procedures: Closed reduction of subtalar and talonavicular dislocation  Procedure Note:  Risks and benefits of the procedure were discussed with the patient and he provided consent.  Once adequate sedation was achieved, the talar joints were reduced using traction.  The deformity was immediately improved.  A well padded short leg splint was applied and his foot and ankle were maintained in neutral alignment until the splint had hardened.  Post reduction XR and CT scan demonstrated adequate reduction.  The patient tolerated the procedure well and he was communicating effectively at the conclusion of the procedure.   Chief Complaint: Left ankle pain   HPI: Christian Ellison is a 59 y.o. male presented to the ED for evaluation of left ankle pain.  Prior to presentation he fell from a ladder, approximately 6 feet.  His left ankle inverted when he hit the ground, and he felt and heard a popping sensation.  He noted immediate pain and deformity.  Pain medication improved his symptoms.  He denies numbness and tingling.  He is also having some back pain, but denies radiating pain from his back.  He did not hit his head.   Past Medical History:  Diagnosis Date  . Anxiety   . Aspiration pneumonia (HCC) 05/26/2018   RLL  . Chronic pain   .  Esophageal stricture   . GERD (gastroesophageal reflux disease)   . 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   Marland Kitchen Opiate abuse, continuous (HCC)    Past Surgical History:  Procedure Laterality Date  . BIOPSY  03/18/2018   Procedure: BIOPSY;  Surgeon: Malissa Hippo, MD;  [facility];  Service: Endoscopy;;  gastric   . COLONOSCOPY N/A 11/22/2013   Dr. Karilyn Cota: Normal except for hemorrhoids.  Next colonoscopy 10 years.  . ESOPHAGEAL DILATION N/A 03/18/2018   Procedure: ESOPHAGEAL DILATION;  Surgeon: Malissa Hippo, MD;  [facility];  Service: Endoscopy;  Laterality: N/A;  . ESOPHAGEAL MANOMETRY N/A 06/15/2018   Procedure: ESOPHAGEAL MANOMETRY (EM);  Surgeon: Napoleon Form, MD;  [facility];  Service: Endoscopy;  Laterality: N/A;  . ESOPHAGOGASTRODUODENOSCOPY (EGD) WITH PROPOFOL N/A 03/18/2018   Dr. Karilyn Cota: Benign-appearing mild esophageal stenosis proximal to the GE J, status post dilation.  2 cm hiatal hernia.  Gastritis, biopsies negative for H. pylori  . Fracture Right Leg     Patient has rod/screws in this leg  . Rotor Cuff  2012   Right Shoulder   Social History   Socioeconomic History  . Marital status: Married    Spouse name: Not on file  . Number of children: Not on file  . Years of education: Not on file  . Highest education level: Not on file  Occupational History  . Occupation: unemployed  Tobacco Use  . Smoking status: Never Smoker  . Smokeless tobacco: Never Used  Vaping Use  . Vaping Use: Never used  Substance and Sexual Activity  . Alcohol use: Not Currently  . Drug use: Not Currently    Types: Benzodiazepines    Comment: opiates  . Sexual activity: Not on file  Other Topics Concern  . Not on file  Social History Narrative  . Not on file   Social Determinants of Health   Financial Resource Strain:   . Difficulty of Paying Living Expenses: Not on file  Food Insecurity:   . Worried About Programme researcher, broadcasting/film/video in  the Last Year: Not on file  . Ran Out of Food in the Last Year: Not on file  Transportation Needs:   . Lack of Transportation (Medical): Not on file  . Lack of Transportation (Non-Medical): Not on file  Physical Activity:   . Days of Exercise per Week: Not on file  . Minutes of Exercise per Session: Not on file  Stress:   . Feeling of Stress : Not on file  Social Connections:   . Frequency of Communication with Friends and Family: Not on file  . Frequency of Social Gatherings with Friends and Family: Not on file  . Attends Religious Services: Not on file  . Active Member of Clubs or Organizations: Not on file  . Attends Banker Meetings: Not on file  . Marital Status: Not on file   Family History  Problem Relation Age of Onset  . Breast cancer Mother   . Colon cancer Father   . Bladder Cancer Father   . Prostate cancer Father   . Hypertension Sister   . Hypothyroidism Sister   . Healthy Sister   . Healthy Daughter   . Healthy Son   . Healthy Son    No Known Allergies Prior to Admission medications   Medication Sig Start Date End Date Taking? Authorizing Provider  acetaminophen (TYLENOL) 325 MG tablet Take 2 tablets (650 mg total) by mouth every 6 (six) hours as needed for mild pain (or Fever >/= 101). 05/17/19   Shon Hale, MD  ALPRAZolam Prudy Feeler) 1 MG tablet Take 1 mg by mouth 4 (four) times daily as needed for anxiety.  10/22/19   [provider]  aspirin EC 81 MG tablet Take 1 tablet (81 mg total) by mouth daily with breakfast. Patient taking differently: Take 81 mg by mouth at bedtime.  03/13/19   Shon Hale, MD  cephALEXin (KEFLEX) 500 MG capsule Take 1 capsule (500 mg total) by mouth 4 (four) times daily. Patient not taking: Reported on 03/26/2020 02/13/20   Durward Parcel, FNP  citalopram (CELEXA) 40 MG tablet Take 40 mg by mouth daily as needed (for mood).  Patient not taking: Reported on 04/21/2020 10/23/19   [provider]   diltiazem (CARDIZEM CD) 120 MG 24 hr capsule Take 1 capsule (120 mg total) by mouth daily. 05/17/19 05/16/20  Shon Hale, MD  doxycycline (VIBRAMYCIN) 100 MG capsule Take 1 capsule (100 mg total) by mouth 2 (two) times daily. Patient not taking: Reported on 03/26/2020 01/29/20   Durward Parcel, FNP  Glucosamine HCl (GLUCOSAMINE PO) Take 1 tablet by mouth at bedtime.     [provider]  guaiFENesin-dextromethorphan (ROBITUSSIN DM) 100-10 MG/5ML syrup Take 10 mLs by mouth every 6 (six) hours as needed for cough. Patient not taking: Reported on 04/21/2020 10/30/19   Maurilio Lovely D, DO  levothyroxine (SYNTHROID, LEVOTHROID) 25 MCG tablet Take 25 mcg by mouth daily. 05/13/18   [provider]  methocarbamol (ROBAXIN) 500 MG tablet Take 1 or 2 po Q 6hrs for chest wall pain Patient not taking: Reported on 03/26/2020 02/17/20   Devoria Albe, MD  Multiple Vitamin (MULTIVITAMIN WITH MINERALS) TABS tablet Take 1 tablet by mouth daily. 50+    [provider]  mupirocin ointment (BACTROBAN) 2 % Apply 1 application topically 2 (two) times daily. Patient not taking: Reported on 03/26/2020 02/13/20   Durward Parcel, FNP  naproxen (NAPROSYN) 500 MG tablet Take 1 tablet (500 mg total) by mouth 2 (two) times daily with a meal. 03/26/20   Darreld Mclean, MD  pantoprazole (PROTONIX) 40 MG tablet Take 1 tablet (40 mg total) by mouth 2 (two) times daily. 05/17/19 05/16/20  Shon Hale, MD   DG Ankle Complete Left  Result Date: 06/20/2020 CLINICAL DATA:  Falling from ladder rolling ankle EXAM: LEFT ANKLE COMPLETE - 3+ VIEW COMPARISON:  None. FINDINGS: There is a a comminuted displaced fracture seen through the posterior and medial body of the talus. There is anterior lateral dislocation of the talus with significant widening at the subtalar joint. There is probable nondisplaced fracture seen through the mid body of the talus. The ankle mortise however appears to be intact. There is diffuse  soft tissue swelling. Ankle joint effusion is noted. IMPRESSION: Comminuted fracture dislocation of the posterior talus and midbody. Electronically Signed   By: Jonna Clark M.D.   On: 06/20/2020 18:44   Family History Reviewed and non-contributory, no pertinent history of problems with bleeding or anesthesia      Review of Systems No numbness or tingling No fevers or chills No chest pain No shortness of breath No bowel or bladder dysfunction No headaches   OBJECTIVE  Vitals: Patient Vitals for the past 8 hrs:  BP Temp Temp src Pulse Resp SpO2  06/20/20 1929 (!) 163/95 -- -- 78 20 94 %  06/20/20 1759 (!) 156/72 97.6 F (36.4 C) Oral 86 18 97 %   General: Alert, no acute distress Cardiovascular: Warm extremities noted Respiratory: No cyanosis, no use of accessory musculature Skin: No lesions in the area of chief complaint other than those listed below in MSK exam.  Neurologic: Sensation intact distally  Psychiatric: Patient is competent for consent with normal mood and affect Extremities  RLE:No deformity or tenderness.  No swelling.  No ecchymosis.  Full and painless ROM of the ankle.  Sensation intact.  +TA/EHL/GS LLE: Obvious deformity of the foot and ankle.  Significant swelling to the foot.  +TA/EHL/GS.  TTP.  Painful ROM.  Toes WWP     Test Results Imaging XR of the left ankle demonstrates comminuted fracture of the talus, particularly the posterior process.  Dislocation of the talonavicular and subtalar joints. Post reduction XR demonstrates improved alignment of the subtalar and talonavicular joints.   CT scan of the left foot and ankle shows significant comminution of the posterior process of the talus, as well as the anteromedial talus.  There are some fragments within the talonavicular joint with 2 large fragments of the anteromedial talus that are rotated 180 degrees.  Talus is adequately reduced.

## 2020-06-20 NOTE — ED Provider Notes (Signed)
[facility] EMERGENCY DEPARTMENT Provider Note   CSN: [id number] Arrival date & time: 06/20/20  1656     History Chief Complaint  Patient presents with  . Ankle Pain    Christian Ellison is a 59 y.o. male who presents for evaluation of left ankle pain after a mechanical fall.  He reports that he was on a ladder and was loading pallets and states he jumped down. He states the ladder was about 6 ft tall. He states that his leg hit a tree limb and bent inward.  He states he heard a pop sensation.  Since then, he has been unable to bear weight or ambulate since this happened.  He has not had anything for pain.  He denies any numbness/weakness.  He denies any head injury, LOC.  He denies any pain in his upper extremities, neck pain.  The history is provided by the patient.       Past Medical History:  Diagnosis Date  . Anxiety   . Aspiration pneumonia (HCC) 05/26/2018   RLL  . Chronic pain   . Esophageal stricture   . GERD (gastroesophageal reflux disease)   . 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   Marland Kitchen Opiate abuse, continuous [facility])     Patient Active Problem List   Diagnosis Date Noted  . Pneumonia 06/24/2019  . Pneumonitis 06/24/2019  . Aspiration pneumonitis (HCC) 06/10/2019  . Closed fracture of xiphoid process   . PNA (pneumonia) 05/15/2019  . Multifocal pneumonia 03/11/2019  . Acute respiratory failure with hypoxemia (HCC) 03/11/2019  . Sinus tachycardia 03/11/2019  . Sepsis due to pneumonia (HCC) 03/11/2019  . Hypothyroidism 03/11/2019  . Bronchopneumonia 11/06/2018  . Acute Respiratory failure with hypoxia (2/2 Asp PNA) 11/06/2018  . Recurrent aspiration pneumonia (HCC)   . Fever 06/09/2018  . Tachycardia 06/09/2018  . Hypoxia 06/09/2018  . Hypotension 06/09/2018  . Esophageal motility disorder   . Aspiration pneumonia of right lower lobe (HCC)   . Sepsis (HCC) 05/26/2018  . Pulmonary edema 02/02/2018  . HCAP (healthcare-associated pneumonia)  02/02/2018  . Weakness generalized 02/02/2018  . Hypertension 02/02/2018  . CHF (congestive heart failure) (HCC) 02/02/2018  . Nausea & vomiting 02/02/2018  . Dysphagia 02/02/2018  . Esophageal stricture/stenosis/esophageal dysmotility with recurrent aspiration 02/02/2018  . Esophageal stenosis 02/02/2018  . GERD (gastroesophageal reflux disease) 02/02/2018  . Polysubstance abuse (HCC) 02/02/2018  . Acute respiratory failure with hypoxia (HCC)   . Transaminitis   . Elevated troponin   . Overdose 01/02/2018  . Hx of hepatitis C 01/17/2013  . Depression 01/17/2013  . Anxiety with depression 01/17/2013  . Right knee pain 01/17/2013    Past Surgical History:  Procedure Laterality Date  . BIOPSY  03/18/2018   Procedure: BIOPSY;  Surgeon: Malissa Hippo, MD;  Location: [facility];  Service: Endoscopy;;  gastric   . COLONOSCOPY N/A 11/22/2013   Dr. Karilyn Cota: Normal except for hemorrhoids.  Next colonoscopy 10 years.  . ESOPHAGEAL DILATION N/A 03/18/2018   Procedure: ESOPHAGEAL DILATION;  Surgeon: Malissa Hippo, MD;  Location: [facility];  Service: Endoscopy;  Laterality: N/A;  . ESOPHAGEAL MANOMETRY N/A 06/15/2018   Procedure: ESOPHAGEAL MANOMETRY (EM);  Surgeon: Napoleon Form, MD;  Location: [facility];  Service: Endoscopy;  Laterality: N/A;  . ESOPHAGOGASTRODUODENOSCOPY (EGD) WITH PROPOFOL N/A 03/18/2018   Dr. Karilyn Cota: Benign-appearing mild esophageal stenosis proximal to the GE J, status post dilation.  2 cm hiatal hernia.  Gastritis, biopsies negative  for H. pylori  . Fracture Right Leg     Patient has rod/screws in this leg  . Rotor Cuff  2012   Right Shoulder       Family History  Problem Relation Age of Onset  . Breast cancer Mother   . Colon cancer Father   . Bladder Cancer Father   . Prostate cancer Father   . Hypertension Sister   . Hypothyroidism Sister   . Healthy Sister   . Healthy Daughter   . Healthy Son   . Healthy Son     Social History    Tobacco Use  . Smoking status: Never Smoker  . Smokeless tobacco: Never Used  Vaping Use  . Vaping Use: Never used  Substance Use Topics  . Alcohol use: Not Currently  . Drug use: Not Currently    Types: Benzodiazepines    Comment: opiates    Home Medications Prior to Admission medications   Medication Sig Start Date End Date Taking? Authorizing Provider  acetaminophen (TYLENOL) 325 MG tablet Take 2 tablets (650 mg total) by mouth every 6 (six) hours as needed for mild pain (or Fever >/= 101). 05/17/19   Shon Hale, MD  ALPRAZolam Prudy Feeler) 1 MG tablet Take 1 mg by mouth 4 (four) times daily as needed for anxiety.  10/22/19   [provider]  aspirin EC 81 MG tablet Take 1 tablet (81 mg total) by mouth daily with breakfast. Patient taking differently: Take 81 mg by mouth at bedtime.  03/13/19   Shon Hale, MD  cephALEXin (KEFLEX) 500 MG capsule Take 1 capsule (500 mg total) by mouth 4 (four) times daily. Patient not taking: Reported on 03/26/2020 02/13/20   Durward Parcel, FNP  citalopram (CELEXA) 40 MG tablet Take 40 mg by mouth daily as needed (for mood).  Patient not taking: Reported on 04/21/2020 10/23/19   [provider]  diltiazem (CARDIZEM CD) 120 MG 24 hr capsule Take 1 capsule (120 mg total) by mouth daily. 05/17/19 05/16/20  Shon Hale, MD  doxycycline (VIBRAMYCIN) 100 MG capsule Take 1 capsule (100 mg total) by mouth 2 (two) times daily. Patient not taking: Reported on 03/26/2020 01/29/20   Durward Parcel, FNP  Glucosamine HCl (GLUCOSAMINE PO) Take 1 tablet by mouth at bedtime.     [provider]  guaiFENesin-dextromethorphan (ROBITUSSIN DM) 100-10 MG/5ML syrup Take 10 mLs by mouth every 6 (six) hours as needed for cough. Patient not taking: Reported on 04/21/2020 10/30/19   Maurilio Lovely D, DO  levothyroxine (SYNTHROID, LEVOTHROID) 25 MCG tablet Take 25 mcg by mouth daily. 05/13/18   [provider]  methocarbamol (ROBAXIN) 500  MG tablet Take 1 or 2 po Q 6hrs for chest wall pain Patient not taking: Reported on 03/26/2020 02/17/20   Devoria Albe, MD  Multiple Vitamin (MULTIVITAMIN WITH MINERALS) TABS tablet Take 1 tablet by mouth daily. 50+    [provider]  mupirocin ointment (BACTROBAN) 2 % Apply 1 application topically 2 (two) times daily. Patient not taking: Reported on 03/26/2020 02/13/20   Durward Parcel, FNP  naproxen (NAPROSYN) 500 MG tablet Take 1 tablet (500 mg total) by mouth 2 (two) times daily with a meal. 03/26/20   Darreld Mclean, MD  oxyCODONE-acetaminophen (PERCOCET/ROXICET) 5-325 MG tablet Take 1-2 tablets by mouth every 8 (eight) hours as needed for severe pain. 06/20/20   Maxwell Caul, PA-C  pantoprazole (PROTONIX) 40 MG tablet Take 1 tablet (40 mg total) by mouth 2 (two)  times daily. 05/17/19 05/16/20  Shon Hale, MD    Allergies    Patient has no known allergies.  Review of Systems   Review of Systems  Musculoskeletal:       Ankle pain  Neurological: Negative for weakness and numbness.  All other systems reviewed and are negative.   Physical Exam Updated Vital Signs BP (!) 158/81   Pulse 82   Temp 98 F (36.7 C)   Resp 15   Wt 93.6 kg   SpO2 92%   BMI 27.99 kg/m   Physical Exam Vitals and nursing note reviewed.  Constitutional:      Appearance: He is well-developed.  HENT:     Head: Normocephalic and atraumatic.  Eyes:     General: No scleral icterus.       Right eye: No discharge.        Left eye: No discharge.     Conjunctiva/sclera: Conjunctivae normal.  Neck:     Comments: Full flexion/extension and lateral movement of neck fully intact. No bony midline tenderness. No deformities or crepitus.  Cardiovascular:     Pulses:          Dorsalis pedis pulses are 2+ on the right side and 2+ on the left side.  Pulmonary:     Effort: Pulmonary effort is normal.  Musculoskeletal:     Comments: Left ankle with swelling and deformity noted.  Limited range of  motion secondary to pain.  Tenderness palpation noted to the medial posterior aspect of the ankle.  He can dorsiflex and plantarflex but does report worsening pain when doing so.  No tenderness noted to proximal tib-fib, knee, femur.  No tenderness palpation of the right lower extremity. No midline tenderness noted to T or L spine. No deformity or crepitus noted. No tenderness to palpation to bilateral shoulders, clavicles, elbows, and wrists. No deformities or crepitus noted. FROM of BUE without difficulty.   Skin:    General: Skin is warm and dry.     Comments: Good distal cap refill. LLE is not dusky in appearance or cool to touch.  Neurological:     Mental Status: He is alert.     Comments: Sensation intact along major nerve distributions of BLE  Psychiatric:        Speech: Speech normal.        Behavior: Behavior normal.     ED Results / Procedures / Treatments   Labs (all labs ordered are listed, but only abnormal results are displayed) Labs Reviewed - No data to display  EKG None  Radiology DG Lumbar Spine Complete  Result Date: 06/20/2020 CLINICAL DATA:  Status post fall off a 6 foot ladder. Having low back pain. EXAM: LUMBAR SPINE - COMPLETE 4+ VIEW COMPARISON:  CT abdomen pelvis 06/10/2019 FINDINGS: Interval development of in anterior wedge compression fracture of the L1 vertebral body. Suggestion of slight anterior height loss of the T12 vertebral body that appears stable compared to CT 2020. Cortical irregularity along the anterior superior corner of the L4 vertebral body of unclear etiology. Alignment is normal. Multilevel mild-to-moderate degenerative changes of the spine. IMPRESSION: 1. Age-indeterminate new anterior wedge compression fracture of the L1 vertebral body. Correlate with point tenderness to palpation to evaluate for acute injury. If clinically indicated, please consider cross-sectional spine imaging for further evaluation of acute traumatic injury. 2. Cortical  irregularity along the anterior superior corner of the L4 vertebral body of unclear etiology. If clinically indicated, please consider cross-sectional spine imaging for  further evaluation of acute traumatic injury. Electronically Signed   By: Tish Frederickson M.D.   On: 06/20/2020 21:38   DG Ankle 2 Views Left  Result Date: 06/20/2020 CLINICAL DATA:  Fracture dislocation, postreduction. EXAM: LEFT ANKLE - 2 VIEW COMPARISON:  Pre reduction radiograph. FINDINGS: Improved alignment of the talus fracture and subtalar dislocation. Subtalar joints are not congruent. Ankle mortise is preserved. No new fracture. Overlying splint material in place limits osseous and soft tissue fine detail. IMPRESSION: Improved alignment of the talus fracture and subtalar dislocation postreduction. Electronically Signed   By: Narda Rutherford M.D.   On: 06/20/2020 21:21   DG Ankle Complete Left  Result Date: 06/20/2020 CLINICAL DATA:  Falling from ladder rolling ankle EXAM: LEFT ANKLE COMPLETE - 3+ VIEW COMPARISON:  None. FINDINGS: There is a a comminuted displaced fracture seen through the posterior and medial body of the talus. There is anterior lateral dislocation of the talus with significant widening at the subtalar joint. There is probable nondisplaced fracture seen through the mid body of the talus. The ankle mortise however appears to be intact. There is diffuse soft tissue swelling. Ankle joint effusion is noted. IMPRESSION: Comminuted fracture dislocation of the posterior talus and midbody. Electronically Signed   By: Jonna Clark M.D.   On: 06/20/2020 18:44   CT Ankle Left Wo Contrast  Result Date: 06/20/2020 CLINICAL DATA:  Ankle fracture fall from ladder EXAM: CT OF THE LEFT ANKLE WITHOUT CONTRAST TECHNIQUE: Multidetector CT imaging of the left ankle was performed according to the standard protocol. Multiplanar CT image reconstructions were also generated. COMPARISON:  Radiograph same day FINDINGS:  Bones/Joint/Cartilage There is been interval reduction of the talar dislocation. There is comminuted fracture seen throughout the anteromedial talar process with extension to the medial talonavicular joint. There is also comminuted fracture seen through the posterior medial talar body with small fracture fragments displaced posteriorly. There are tiny fracture fragment seen adjacent to the medial malleolus. A rounded fracture fragment seen within the sinus tarsi measuring 7 mm. Comminuted mildly displaced lateral anterior calcaneal process fracture is noted. Ligaments Suboptimally assessed by CT. Muscles and Tendons There is extensive edema possible small intramuscular hematoma seen at the myotendinous junction the extensor digitorum musculature. The flexor and extensor tendons however appear to be intact. The Achilles tendon is intact. Soft tissues Extensive soft tissue swelling the ankle with soft tissue hematoma. IMPRESSION: Interval reduction fracture dislocation. Extensively comminuted fractures through the talar body, and anterolateral calcaneus as described above. 7 mm fracture fragment within the sinus tarsi Extensive soft tissue swelling around the ankle with probable intramuscular/soft tissue hematoma within the flexor digitorum myotendinous junction. Electronically Signed   By: Jonna Clark M.D.   On: 06/20/2020 22:14    Procedures Procedures (including critical care time)  Medications Ordered in ED Medications  propofol (DIPRIVAN) 10 mg/mL bolus/IV push 93.6 mg ( Intravenous See Procedure Record 06/20/20 2251)  morphine 4 MG/ML injection 4 mg (4 mg Intravenous Given 06/20/20 1923)  ondansetron (ZOFRAN) injection 4 mg (4 mg Intravenous Given 06/20/20 1923)  propofol (DIPRIVAN) 10 mg/mL bolus/IV push (25 mg Intravenous Given 06/20/20 2042)  oxyCODONE-acetaminophen (PERCOCET/ROXICET) 5-325 MG per tablet 1 tablet (1 tablet Oral Given 06/20/20 2255)    ED Course  I have reviewed the triage vital  signs and the nursing notes.  Pertinent labs & imaging results that were available during my care of the patient were reviewed by me and considered in my medical decision making (see chart for details).  MDM Rules/Calculators/A&P                          59 year old male who presents for evaluation of left ankle pain status post mechanical fall that occurred earlier today.  Difficulty ambulating bearing weight secondary to pain.  On initial arrival, he is afebrile, nontoxic-appearing.  Vital signs are stable.  He is neurovascularly intact.  Left ankle with swelling and deformity noted.  Limited range of motion secondary to pain.  Concern for fracture or dislocation.  X-rays ordered at triage.  XR shows comminuted displaced fracture seen through the posterior and medial body of the talus.  There is anterior lateral dislocation of the talus with significant widening of the subtalar joint.  There ankle mortise appears to be intact.  Discussed patient with Dr. Dallas Schimkeairns (Ortho) who will come evaluate the patient in the ED.   Discussed patient with Dr. Dallas Schimkeairns (Ortho).  She would like to do procedural sedation here in the ED to attempt to reduce the fracture dislocation.  Please see separate consult note from ED attending regarding sedation.  Procedural sedation as documented.  Repeat x-ray shows improved alignment.  Ortho is asking that we get a CT of the ankle.  Wife discussed me concerned that patient was having some back pain.  On my evaluation, he states that he was not having any back pain.  He did not have any tenderness on his lumbar spine but wife would like me to obtain x-rays which I feel is reasonable.  Repeat ankle x-ray shows improved alignment of the talus fracture and subtalar dislocation post reduction.  Lumbar x-ray shows age-indeterminate new anterior wedge compression fracture of the L1 vertebral body.  There is also a cortical irregularity along the anterior superior corner of the  L4 vertebral body of unclear etiology.  Reevaluation.  I palpated patient's midline T and L-spine again and he had no tenderness on palpation.  Will consult neurosurgery.  Discussed with Hildred PriestMegan Bergman (neurosurgeon P) regarding findings.  She states that patient is having a lot of pain, he can be fitted for a T SLO brace.  Otherwise, he can follow-up in the office on an outpatient basis.  Discussed plan with both patient and wife.  They are agreeable.  Given his ankle findings, will plan to send him home with a short course of pain medication.  Patient instructed be nonweightbearing on that ankle and to follow-up with Ortho as directed.  Reevaluation after splint placement shows good distal cap refill and sensation.  Patient does not have any back pain and no tenderness noted on palpation.  At this time, does not need a T SLO brace.  Instructed to follow-up with referred neurosurgery as directed.  At this time, patient exhibits no emergent life-threatening condition that require further evaluation in ED. Patient had ample opportunity for questions and discussion. All patient's questions were answered with full understanding. Strict return precautions discussed. Patient expresses understanding and agreement to plan.   Portions of this note were generated with Scientist, clinical (histocompatibility and immunogenetics)Dragon dictation software. Dictation errors may occur despite best attempts at proofreading.   Final Clinical Impression(s) / ED Diagnoses Final diagnoses:  Closed fracture of left ankle, initial encounter  Closed compression fracture of body of L1 vertebra (HCC)    Rx / DC Orders ED Discharge Orders         Ordered    oxyCODONE-acetaminophen (PERCOCET/ROXICET) 5-325 MG tablet  Every 8 hours PRN  06/20/20 2329           Maxwell Caul, PA-C 06/21/20 4163    Bethann Berkshire, MD 06/21/20 2230

## 2020-06-20 NOTE — ED Triage Notes (Signed)
Pt reports falling from 6 foot ladder and rolling his left ankle. Pt states he heard a "pop." Swelling and bruising noted to the left ankle with inward rotation.

## 2020-06-24 ENCOUNTER — Other Ambulatory Visit: Payer: Self-pay | Admitting: Orthopaedic Surgery

## 2020-06-28 NOTE — Progress Notes (Signed)
[facility] [address], [place] - [address] [place] [address] Phone: [phone] Fax: [phone]  [facility] - [place], [place] - [address] [place] [address] Phone: [phone] Fax: [phone]  [id number] - [place], [place] - [address] [place] [address] Phone: [phone] Fax: [phone]      Your procedure is scheduled on 111/18/2021.  Report to [facility] Main Entrance "A" at 12:15pm A.M., and check in at the Admitting office. Your surgery is scheduled for 2:15pm  Call this number if you have problems the morning of surgery:  [phone]  Call [phone] if you have any questions prior to your surgery date Monday-Friday 8am-4pm    Remember:  Do not eat after midnight the night before your surgery  You may drink clear liquids until 11:15am the morning of your surgery.   Clear liquids allowed are: Water, Non-Citrus Juices (without pulp), Carbonated Beverages, Clear Tea, Black Coffee Only, and Gatorade   Patient Instructions  . The night before surgery:  o No food after midnight. ONLY clear liquids after midnight  . The day of surgery (if you do NOT have diabetes):  o Drink ONE (1) Pre-Surgery Clear Ensure before 11:15am. o This drink was given to you during your hospital . pre-op appointment visit.  o Finish the drink at the designated time by the pre-op nurse.  o Nothing else to drink after completing the  Pre-Surgery Clear Ensure.          If you have questions, please contact your surgeon's office.   Take these medicines the morning of surgery with A SIP OF WATER  Diltiazem (cardizem) levothyroxine (SYNTHROID, LEVOTHROID) Pantoprazole (protonix)  Take as needed the morning of surgery.  ALPRAZolam Prudy Feeler)   As of today, STOP taking any Aspirin (unless otherwise instructed by your surgeon) Aleve, Naproxen, Ibuprofen, Motrin, Advil,  Goody's, BC's, all herbal medications, fish oil, and all vitamins.                      Do not wear jewelry, make up, or nail polish            Do not wear lotions, powders, perfumes/colognes, or deodorant.            Do not shave 48 hours prior to surgery.  Men may shave face and neck.            Do not bring valuables to the hospital.            [facility] is not responsible for any belongings or valuables.  Do NOT Smoke (Tobacco/Vaping) or drink Alcohol 24 hours prior to your procedure If you use a CPAP at night, you may bring all equipment for your overnight stay.   Contacts, glasses, dentures or bridgework may not be worn into surgery.      For patients admitted to the hospital, discharge time will be determined by your treatment team.   Patients discharged the day of surgery will not be allowed to drive home, and someone needs to stay with them for 24 hours.    Special instructions:   [facility]- Preparing For Surgery  Before surgery, you can play an important role. Because skin is not sterile, your skin needs to be as free of germs as possible. You can reduce the number of germs on your skin by washing  with CHG (chlorahexidine gluconate) Soap before surgery.  CHG is an antiseptic cleaner which kills germs and bonds with the skin to continue killing germs even after washing.    Oral Hygiene is also important to reduce your risk of infection.  Remember - BRUSH YOUR TEETH THE MORNING OF SURGERY WITH YOUR REGULAR TOOTHPASTE  Please do not use if you have an allergy to CHG or antibacterial soaps. If your skin becomes reddened/irritated stop using the CHG.  Do not shave (including legs and underarms) for at least 48 hours prior to first CHG shower. It is OK to shave your face.  Please follow these instructions carefully.   1. Shower the NIGHT BEFORE SURGERY and the MORNING OF SURGERY with CHG Soap.   2. If you chose to wash your hair, wash your hair first as usual with your  normal shampoo.  3. After you shampoo, rinse your hair and body thoroughly to remove the shampoo.  4. Use CHG as you would any other liquid soap. You can apply CHG directly to the skin and wash gently with a scrungie or a clean washcloth.   5. Apply the CHG Soap to your body ONLY FROM THE NECK DOWN.  Do not use on open wounds or open sores. Avoid contact with your eyes, ears, mouth and genitals (private parts). Wash Face and genitals (private parts)  with your normal soap.   6. Wash thoroughly, paying special attention to the area where your surgery will be performed.  7. Thoroughly rinse your body with warm water from the neck down.  8. DO NOT shower/wash with your normal soap after using and rinsing off the CHG Soap.  9. Pat yourself dry with a CLEAN TOWEL.  10. Wear CLEAN PAJAMAS to bed the night before surgery  11. Place CLEAN SHEETS on your bed the night of your first shower and DO NOT SLEEP WITH PETS.   Day of Surgery: Wear Clean/Comfortable clothing the morning of surgery Do not apply any deodorants/lotions.   Remember to brush your teeth WITH YOUR REGULAR TOOTHPASTE.   Please read over the following fact sheets that you were given.

## 2020-07-01 ENCOUNTER — Other Ambulatory Visit: Payer: Self-pay

## 2020-07-01 ENCOUNTER — Encounter (HOSPITAL_COMMUNITY): Payer: Self-pay

## 2020-07-01 ENCOUNTER — Encounter (HOSPITAL_COMMUNITY)
Admission: RE | Admit: 2020-07-01 | Discharge: 2020-07-01 | Disposition: A | Discharge status: Home / Self Care | Payer: 59 | Source: Ambulatory Visit | Attending: Orthopaedic Surgery | Admitting: Orthopaedic Surgery

## 2020-07-01 ENCOUNTER — Other Ambulatory Visit (HOSPITAL_COMMUNITY)
Admission: RE | Admit: 2020-07-01 | Discharge: 2020-07-01 | Disposition: A | Discharge status: Home / Self Care | Payer: 59 | Source: Ambulatory Visit | Attending: Orthopaedic Surgery | Admitting: Orthopaedic Surgery

## 2020-07-01 DIAGNOSIS — Y929 Unspecified place or not applicable: Secondary | ICD-10-CM | POA: Insufficient documentation

## 2020-07-01 DIAGNOSIS — Z01812 Encounter for preprocedural laboratory examination: Secondary | ICD-10-CM | POA: Insufficient documentation

## 2020-07-01 DIAGNOSIS — Y939 Activity, unspecified: Secondary | ICD-10-CM | POA: Diagnosis not present

## 2020-07-01 DIAGNOSIS — S92122A Displaced fracture of body of left talus, initial encounter for closed fracture: Secondary | ICD-10-CM | POA: Insufficient documentation

## 2020-07-01 DIAGNOSIS — M24072 Loose body in left ankle: Secondary | ICD-10-CM | POA: Insufficient documentation

## 2020-07-01 DIAGNOSIS — Z20822 Contact with and (suspected) exposure to covid-19: Secondary | ICD-10-CM | POA: Insufficient documentation

## 2020-07-01 DIAGNOSIS — S92002A Unspecified fracture of left calcaneus, initial encounter for closed fracture: Secondary | ICD-10-CM | POA: Diagnosis not present

## 2020-07-01 LAB — CBC
HCT: 40.7 % (ref 39.0–52.0)
Hemoglobin: 13.4 g/dL (ref 13.0–17.0)
MCH: 29.3 pg (ref 26.0–34.0)
MCHC: 32.9 g/dL (ref 30.0–36.0)
MCV: 89.1 fL (ref 80.0–100.0)
Platelets: 306 10*3/uL (ref 150–400)
RBC: 4.57 MIL/uL (ref 4.22–5.81)
RDW: 12 % (ref 11.5–15.5)
WBC: 4.5 10*3/uL (ref 4.0–10.5)
nRBC: 0 % (ref 0.0–0.2)

## 2020-07-01 LAB — BASIC METABOLIC PANEL
Anion gap: 11 (ref 5–15)
BUN: 12 mg/dL (ref 6–20)
CO2: 27 mmol/L (ref 22–32)
Calcium: 9.4 mg/dL (ref 8.9–10.3)
Chloride: 100 mmol/L (ref 98–111)
Creatinine, Ser: 0.78 mg/dL (ref 0.61–1.24)
GFR, Estimated: >60 mL/min (ref >60)
Glucose, Bld: 107 mg/dL — ABNORMAL HIGH (ref 70–99)
Potassium: 3.9 mmol/L (ref 3.5–5.1)
Sodium: 138 mmol/L (ref 135–145)

## 2020-07-01 LAB — SURGICAL PCR SCREEN
MRSA, PCR: NEGATIVE
Staphylococcus aureus: NEGATIVE

## 2020-07-01 LAB — SARS CORONAVIRUS 2 (TAT 6-24 HRS): SARS Coronavirus 2: NEGATIVE

## 2020-07-01 NOTE — Progress Notes (Signed)
Case: [id number] Date/Time: 07/04/20 1400   Procedure: OPEN REDUCTION INTERNAL FIXATION (ORIF) LEFT TALUS AND CALCANEOUS FRACTURE, SUBTALAR JOINT ARTHROTOMY WITH REMOVAL OF LOOSE BODIES, TALONAVICULAR JOINT ARTHOTOMY WITH REMOVAL OF LOOSE BODIES (Left )   Anesthesia type: General   Pre-op diagnosis: LEFT TALUS FRACTURE DISLOCATION, CALCANEUS FRACTURE, LEFT SUBTALAR JOINT AND TALONAVICULAR JOINT INTRA-ARTICULAR LOOSE BODIES   [facility] / [facility]   Surgeons: Christian Hart, MD      DISCUSSION: Patient is a 59 year old male scheduled for the above procedure.   History includes never smoker, OSA (CPAP), hepatitis C (HCV RNA 12/26/12, Dec 26, 2017), GERD, anxiety, chronic pain, chronic opiate use, mid-esophageal stenosis (s/p dilatation 03/18/18), recurrent aspiration pneumonia (initially thought due to over sedation/OD; later GI suggests possibly related to esophageal dysphagia secondary to EG outflow obstruction, did not meet criteria for achalasia given normal peristalsis by Dec 26, 2017 esophageal manometry and was started on diltiazem before meals, consider referral to [facility] for Botox if needed; last GI visit with Dr. Karilyn Cota 05/2019).    - ED visit 06/20/20 for left ankle pain following mechanical fall. He was on a ladder trimming tress with a pole saw when a limb fell down causing him to fall off the ladder. When he landed he heard a popping sensation and was unable to bear weight. Xray showed comminuted displaced fracture seen through the posterior and medial body of the talus and anterior lateral dislocation of the talus with significant widening of the subtalar joint. Ortho performed bedside reduction with improved alignment.  Lumbar xray also showed an age-indeterminate new anterior wedge compression fracture of the L1 vertebral body. Neurosurgery said he could be fitted with a TSLO brace if needed for pain, otherwise out-patient follow-up. O2 sat 92% with "normal" pulmonary effort.     - ED visit  06/12/20 ([facility]) for dyspnea and hypoxia (pO2 73 with pCO2 51, pH 7.35 on ABG). He was "deeply somnolent but easily arousable" CXR showed low lung volume with bibasilar atelectasis or pneumonia. UDS negative except for benzo which were prescribed. Negative respiratory panel. Blood cultures negative. WBC 6.9K. Planned admission for hypoxia with probable pneumonia and medication overdose (Xanax); however, patient left AMA. He was given antibiotics in the ED. Notes suggest he was dissatisfied with his care there and that he made inappropriate comments. (I did not ask him about his behavior there, but he indicated that he had regretfully acted poorly to staff there, mostly out of anxiety from being alone without his wife in the ED.)  - ED visit 04/21/20 at the prompting of his wife for "low" oxygen sats while sleeping. He was not getting much sleep due to stressors (including mother's death in Dec 27, 2019 caring for his father, 65 year old son died 06-Sep-2018). Taking Xanax. Refused UDS, but otherwise labs reassuring, COVID test negative, normal BNP, and unremarkable CXR. O2 sat 92%. He was discharged home.  - ED visit 02/17/20 with dyspnea with acuate "pop" in this chest and concern for PTX. Home O2 sats in the 80's. Imaging showed possible nondisplaced rib fracture without PTX. He was treated with Flexeril and Toradol. Discharged home primarily in the mid-to-low 90's.  - ED visit 02/13/20 for fall with left arm wound and left rib pain. Negative imaging. - ED visit 01/29/20 for insect bite - Admission 10/29/19-10/30/19 for acute respiratory failure with hypoxia, likely recurrent aspiration pneumonia or atypical pneumonia. RA O2 sat 83%. He was treated with antibiotics. Notes indicate he refused to wear O2  during his in-patient stay but was compliant with CPAP at night. Ambulatory O2 sat without hypoxemia by discharge.   I called and spoke with Christian Ellison and his wife. Patient says that following his  06/12/20 ED visit, he felt recovered after a few days. His recurrent aspiration pneumonias tend to occur when he is not being as careful with eating slowly with small/chopped food bites or if he is not sitting up long enough after meals (he is suppose to be upright for 2 hours). He does not use home O2, but uses CPAP and checks his O2 sat regularly. He says his breathing and daytime alertness have been much better since starting CPAP within the pat 2 years. Besides smaller bites, sitting upright for mid-esophageal stenosis, he is also on diltiazem and Protonix and low acid diet which he feels has helped. He feels at his baseline currently, other than his foot fracture. He denied orthopnea, edema (other than related to fracture), syncope, chest pain. No conversational dyspnea. 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 He rarely needs MDI.   He says his sisters are nurses with [employer]. Last ASA 07/01/20. 07/01/20 presurgical COVID-19 test negative. Currently, he feels at his baseline from a pulmonary standpoint. Reviewed with anesthesiologist Christian Ellison, Thomas, MD. Anesthesia team to evaluate on the day of surgery.    VS: BP 140/88   Pulse 80   Temp 36.8 C (Oral)   Resp 17   Ht 6' (1.829 m)   Wt 89.6 kg   SpO2 98%   BMI 26.80 kg/m    PROVIDERS: Christian CoopNicholson, Christian J IV, FNP is listed as PCP  Christian Christian Ellison, Najeeb, MD is GI. Last visit 05/2019.  - He is not currently followed by cardiology, but was evaluated by Christian MangesKlein, Steven, MD during May 2019 admission for acute respiratory failure (found unresponsive) requiring intubation and diagnosed with aspiration pneumonia in setting +benzo, opiates. Had elevated troponins, felt likely demand ischemia. Had outpatient follow-up with Christian Ellison, Suresh, MD and had a low risk ETT. LVEF normal by echo with no wall  motion abnormalities.     LABS: Labs reviewed: Acceptable for surgery. (all labs ordered are listed, but only abnormal results are displayed)  Labs Reviewed  BASIC METABOLIC PANEL - Abnormal; Notable for the following components:      Result Value   Glucose, Bld 107 (*)    All other components within normal limits  SURGICAL PCR SCREEN  CBC     IMAGES: CXR 06/12/20 [facility] FINDINGS:  Low volume chest with streaky and indistinct densities at the bases.  No edema, effusion, or pneumothorax. Normal heart size for low lung  volumes.  IMPRESSION:  Low volume chest with atelectasis or pneumonia at both lung bases.    CT left ankle 06/20/20: IMPRESSION: - Interval reduction fracture dislocation. Extensively comminuted fractures through the talar body, and anterolateral calcaneus as described above. - 7 mm fracture fragment within the sinus tarsi - Extensive soft tissue swelling around the ankle with probable intramuscular/soft tissue hematoma within the flexor digitorum myotendinous junction.  Esophogram/Barium Swallow 05/29/19: IMPRESSION: 1. Obstruction at the level of the gastroesophageal junction. There is some distal fold thickening in the esophagus immediately above the gastroesophageal junction, but no discrete mass is confidently identified on today's limited examination. 2. 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 - per GI, UGI "had to be terminated due to lack of contrast transit". Patient able to drink milkshake that morning. OBS admission recommended due to high risk of aspiration, but patient declined so was avoided to avoid oral intake for 6 hours then advance to CL and remain upright for at least 3 hours after any oral ingestion.  Esophageal Manometry 06/15/18: Impressions: EG junction outflow obstruction. Will need to exclude mechanical stricture, extrinsic compression, intramural lesion or high dose narcotics. Peristolic pattern  not consistent with achalasia., could be considered pre achalasia or achalasia variant in the absence of above mentioned etiology. Poor bolus clearance.   Esophogram/Barium Swallow 05/27/18: IMPRESSION: Persistent smooth strictured narrowing of the distal esophagus with very little passage of barium into the stomach.  EGD 03/18/18: Impression: - Normal proximal esophagus and mid esophagus. - Benign-appearing mild esophageal stenosis proximal to GEJ. Dilated. - Z-line irregular, 39 cm from the incisors. - 2 cm hiatal hernia. - Gastritis. Biopsied. - Normal duodenal bulb and second portion of the duodenum.   EKG: EKG 04/21/20: Sinus rhythm Nonspecific intraventricular conduction delay Nonspecific T abnormalities, lateral leads Confirmed by Geoffery Lyons ([id number]) on 04/22/2020 9:09:40 PM - He has had intermittent lateral T wave abnormality on EKGs since June 2020.    CV: ETT 02/16/18:  Blood pressure demonstrated a normal response to exercise. Excellent exercise tolerance.  There was no ST segment deviation noted during stress.  [facility] treadmill score portends a low risk for cardiac events.  Echo 01/03/18: Study Conclusions  - Left ventricle: The cavity size was moderately dilated. Systolic  function was normal. The estimated ejection fraction was in the  range of 55% to 60%. Wall motion was normal; there were no  regional wall motion abnormalities. Left ventricular diastolic  function parameters were normal.    Past Medical History:  Diagnosis Date  . Anxiety   . Aspiration pneumonia (HCC) 05/26/2018   RLL  . Chronic pain   . Esophageal stricture   . GERD (gastroesophageal reflux disease)   . 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   Marland Kitchen Opiate abuse, continuous (HCC)     Past Surgical History:  Procedure Laterality Date  . BIOPSY  03/18/2018   Procedure: BIOPSY;  Surgeon: Malissa Hippo, MD;  Location: [facility];  Service: Endoscopy;;  gastric   .  COLONOSCOPY N/A 11/22/2013   Dr. Karilyn Cota: Normal except for hemorrhoids.  Next colonoscopy 10 years.  . ESOPHAGEAL DILATION N/A 03/18/2018   Procedure: ESOPHAGEAL DILATION;  Surgeon: Malissa Hippo, MD;  Location: [facility];  Service: Endoscopy;  Laterality: N/A;  . ESOPHAGEAL MANOMETRY N/A 06/15/2018   Procedure: ESOPHAGEAL MANOMETRY (EM);  Surgeon: Napoleon Form, MD;  Location: [facility];  Service: Endoscopy;  Laterality: N/A;  . ESOPHAGOGASTRODUODENOSCOPY (EGD) WITH PROPOFOL N/A 03/18/2018   Dr. Karilyn Cota: Benign-appearing mild esophageal stenosis proximal to the GE J, status post dilation.  2 cm hiatal hernia.  Gastritis, biopsies negative for H. pylori  . Fracture Right Leg     Patient has rod/screws in this leg  . Rotor Cuff  2012   Right Shoulder    MEDICATIONS: . acetaminophen (TYLENOL) 325 MG tablet  . ALPRAZolam (XANAX) 1 MG tablet  . aspirin EC 81 MG tablet  . cephALEXin (KEFLEX) 500 MG capsule  . citalopram (CELEXA) 40 MG tablet  . diltiazem (TIAZAC) 120 MG 24 hr capsule  . doxycycline (VIBRAMYCIN) 100 MG capsule  . Glucosamine HCl (GLUCOSAMINE PO)  .  guaiFENesin-dextromethorphan (ROBITUSSIN DM) 100-10 MG/5ML syrup  . levothyroxine (SYNTHROID, LEVOTHROID) 25 MCG tablet  . methocarbamol (ROBAXIN) 500 MG tablet  . Multiple Vitamin (MULTIVITAMIN WITH MINERALS) TABS tablet  . mupirocin ointment (BACTROBAN) 2 %  . naproxen (NAPROSYN) 500 MG tablet  . oxyCODONE-acetaminophen (PERCOCET/ROXICET) 5-325 MG tablet  . pantoprazole (PROTONIX) 40 MG tablet   No current facility-administered medications for this encounter.   By medication list, he is not currently taking Keflex, doxycycline, Robaxin, Robitussin DM, Bactroban, or Celexa   Shonna Chock, PA-C Surgical Short Stay/Anesthesiology [facility] Phone [phone] [facility] Phone [phone] 07/02/2020 4:28 PM

## 2020-07-01 NOTE — Progress Notes (Signed)
PCP - Berton Mount, NP Cardiologist - denies  Chest x-ray - 10/29/19 EKG - 04/21/20 Stress Test - 11/03/17 ECHO - 02/16/18 Cardiac Cath -denies   Sleep Study - OSA+ CPAP - uses nightly  Blood Thinner Instructions: n/a Aspirin Instructions: stopping today.  ERAS Protcol -Yes, pt to stop clears by 1115 DOS. PRE-SURGERY Ensure or G2- Pt provided with (1) Pre-surgical Ensure.   COVID TEST- 07/01/20   Anesthesia review: Yes, recent hospitalization for aspiration pneumonia.     Patient denies shortness of breath, fever, cough and chest pain at PAT appointment   All instructions explained to the patient, with a verbal understanding of the material. Patient agrees to go over the instructions while at home for a better understanding. Patient also instructed to self quarantine after being tested for COVID-19. The opportunity to ask questions was provided.   Coronavirus Screening  Have you experienced the following symptoms:  Cough yes/no: No Fever (>100.69F)  yes/no: No Runny nose yes/no: No Sore throat yes/no: No Difficulty breathing/shortness of breath  yes/no: No  Have you or a family member traveled in the last 14 days and where? yes/no: No   If the patient indicates "YES" to the above questions, their PAT will be rescheduled to limit the exposure to others and, the surgeon will be notified. THE PATIENT WILL NEED TO BE ASYMPTOMATIC FOR 14 DAYS.   If the patient is not experiencing any of these symptoms, the PAT nurse will instruct them to NOT bring anyone with them to their appointment since they may have these symptoms or traveled as well.   Please remind your patients and families that hospital visitation restrictions are in effect and the importance of the restrictions.

## 2020-07-01 NOTE — Progress Notes (Signed)
Your procedure is scheduled on Thursday, November 18th at 2:15 p.m.  Report to [facility] Main Entrance "A" at 12:15pm A.M., and check in at the Admitting office. Your surgery is scheduled for 2:15pm  Call this number if you have problems the morning of surgery:  [phone]  Call [phone] if you have any questions prior to your surgery date Monday-Friday 8am-4pm    Remember:  Do not eat after midnight the night before your surgery  You may drink clear liquids until 11:15am the morning of your surgery.   Clear liquids allowed are: Water, Non-Citrus Juices (without pulp), Carbonated Beverages, Clear Tea, Black Coffee Only, and Gatorade   Enhanced Recovery after Surgery for Orthopedics Enhanced Recovery after Surgery is a protocol used to improve the stress on your body and your recovery after surgery.  Patient Instructions  . The night before surgery:  o No food after midnight. ONLY clear liquids after midnight  .  Marland Kitchen The day of surgery (if you do NOT have diabetes):  o Drink ONE (1) Pre-Surgery Clear Ensure by 11:15 am the morning of surgery   o This drink was given to you during your hospital  pre-op appointment visit. o Nothing else to drink after completing the  Pre-Surgery Clear Ensure.         If you have questions, please contact your surgeon's office.         If you have questions, please contact your surgeon's office.   Take these medicines the morning of surgery with A SIP OF WATER  Diltiazem (cardizem) diltiazem (TIAZAC) levothyroxine (SYNTHROID, LEVOTHROID) Pantoprazole (protonix)  Take as needed the morning of surgery.  ALPRAZolam Prudy Feeler)   As of today, STOP taking any Aspirin (unless otherwise instructed by your surgeon) Aleve, Naproxen, Ibuprofen, Motrin, Advil, Goody's, BC's, all herbal medications, fish oil, and all vitamins.                      Do not wear jewelry.            Do not wear lotions, powders, colognes, or deodorant.            Men  may shave face and neck.            Do not bring valuables to the hospital.            [facility] is not responsible for any belongings or valuables.  Do NOT Smoke (Tobacco/Vaping) or drink Alcohol 24 hours prior to your procedure If you use a CPAP at night, you may bring all equipment for your overnight stay.   Contacts, glasses, dentures or bridgework may not be worn into surgery.      For patients admitted to the hospital, discharge time will be determined by your treatment team.   Patients discharged the day of surgery will not be allowed to drive home, and someone needs to stay with them for 24 hours.    Special instructions:   [place]- Preparing For Surgery  Before surgery, you can play an important role. Because skin is not sterile, your skin needs to be as free of germs as possible. You can reduce the number of germs on your skin by washing with CHG (chlorahexidine gluconate) Soap before surgery.  CHG is an antiseptic cleaner which kills germs and bonds with the skin to continue killing germs even after washing.    Oral Hygiene is also important to reduce your risk of infection.  Remember - BRUSH  YOUR TEETH THE MORNING OF SURGERY WITH YOUR REGULAR TOOTHPASTE  Please do not use if you have an allergy to CHG or antibacterial soaps. If your skin becomes reddened/irritated stop using the CHG.  Do not shave (including legs and underarms) for at least 48 hours prior to first CHG shower. It is OK to shave your face.  Please follow these instructions carefully.   1. Shower the NIGHT BEFORE SURGERY and the MORNING OF SURGERY with CHG Soap.   2. If you chose to wash your hair, wash your hair first as usual with your normal shampoo.  3. After you shampoo, rinse your hair and body thoroughly to remove the shampoo.  4. Use CHG as you would any other liquid soap. You can apply CHG directly to the skin and wash gently with a scrungie or a clean washcloth.   5. Apply the CHG Soap to  your body ONLY FROM THE NECK DOWN.  Do not use on open wounds or open sores. Avoid contact with your eyes, ears, mouth and genitals (private parts). Wash Face and genitals (private parts)  with your normal soap.   6. Wash thoroughly, paying special attention to the area where your surgery will be performed.  7. Thoroughly rinse your body with warm water from the neck down.  8. DO NOT shower/wash with your normal soap after using and rinsing off the CHG Soap.  9. Pat yourself dry with a CLEAN TOWEL.  10. Wear CLEAN PAJAMAS to bed the night before surgery  11. Place CLEAN SHEETS on your bed the night of your first shower and DO NOT SLEEP WITH PETS.   Day of Surgery: Wear Clean/Comfortable clothing the morning of surgery Do not apply any deodorants/lotions.   Remember to brush your teeth WITH YOUR REGULAR TOOTHPASTE.   Please read over the following fact sheets that you were given.

## 2020-07-02 NOTE — Anesthesia Preprocedure Evaluation (Addendum)
Anesthesia Evaluation  Patient identified by MRN, date of birth, ID band Patient awake    Reviewed: Allergy & Precautions, H&P , NPO status , Patient's Chart, lab work & pertinent test results  Airway Mallampati: III  TM Distance: >3 FB Neck ROM: Full    Dental no notable dental hx. (+) Teeth Intact, Dental Advisory Given   Pulmonary sleep apnea and Continuous Positive Airway Pressure Ventilation ,    Pulmonary exam normal breath sounds clear to auscultation       Cardiovascular hypertension,  Rhythm:Regular Rate:Normal     Neuro/Psych Anxiety Depression negative neurological ROS     GI/Hepatic GERD  Medicated,(+)     substance abuse  , Hepatitis -, C  Endo/Other  Hypothyroidism   Renal/GU negative Renal ROS  negative genitourinary   Musculoskeletal  (+) narcotic dependent  Abdominal   Peds  Hematology negative hematology ROS (+)   Anesthesia Other Findings   Reproductive/Obstetrics negative OB ROS                            Anesthesia Physical Anesthesia Plan  ASA: III  Anesthesia Plan: General   Post-op Pain Management:  Regional for Post-op pain   Induction: Intravenous  PONV Risk Score and Plan: 3 and Ondansetron, Dexamethasone and Midazolam  Airway Management Planned: Oral ETT  Additional Equipment:   Intra-op Plan:   Post-operative Plan: Extubation in OR  Informed Consent: I have reviewed the patients History and Physical, chart, labs and discussed the procedure including the risks, benefits and alternatives for the proposed anesthesia with the patient or authorized representative who has indicated his/her understanding and acceptance.     Dental advisory given  Plan Discussed with: CRNA  Anesthesia Plan Comments: (See PAT note written 07/02/2020 by Shonna Chock, PA-C. History of mid-esophageal stenosis with recurrent aspiration pneumonia (on PPI, Ca-channel  blocker, chopped meats, avoid eating too fast, needs to sit upright after eating), hep C, anxiety.   )       Anesthesia Quick Evaluation

## 2020-07-04 ENCOUNTER — Ambulatory Visit (HOSPITAL_COMMUNITY): Payer: 59

## 2020-07-04 ENCOUNTER — Other Ambulatory Visit: Payer: Self-pay

## 2020-07-04 ENCOUNTER — Encounter (HOSPITAL_COMMUNITY): Payer: Self-pay | Admitting: Orthopaedic Surgery

## 2020-07-04 ENCOUNTER — Encounter (HOSPITAL_COMMUNITY)
Admission: RE | Disposition: A | Discharge status: Home / Self Care | Payer: Self-pay | Source: Home / Self Care | Attending: Orthopaedic Surgery

## 2020-07-04 ENCOUNTER — Ambulatory Visit (HOSPITAL_COMMUNITY): Payer: 59 | Admitting: Vascular Surgery

## 2020-07-04 ENCOUNTER — Ambulatory Visit (HOSPITAL_COMMUNITY): Payer: 59 | Admitting: Certified Registered"

## 2020-07-04 ENCOUNTER — Ambulatory Visit (HOSPITAL_COMMUNITY)
Admission: RE | Admit: 2020-07-04 | Discharge: 2020-07-04 | Disposition: A | Discharge status: Home / Self Care | Payer: 59 | Attending: Orthopaedic Surgery | Admitting: Orthopaedic Surgery

## 2020-07-04 DIAGNOSIS — Z791 Long term (current) use of non-steroidal anti-inflammatories (NSAID): Secondary | ICD-10-CM | POA: Diagnosis not present

## 2020-07-04 DIAGNOSIS — Z79899 Other long term (current) drug therapy: Secondary | ICD-10-CM | POA: Diagnosis not present

## 2020-07-04 DIAGNOSIS — S92002A Unspecified fracture of left calcaneus, initial encounter for closed fracture: Secondary | ICD-10-CM | POA: Diagnosis not present

## 2020-07-04 DIAGNOSIS — Z7982 Long term (current) use of aspirin: Secondary | ICD-10-CM | POA: Insufficient documentation

## 2020-07-04 DIAGNOSIS — Z79890 Hormone replacement therapy (postmenopausal): Secondary | ICD-10-CM | POA: Diagnosis not present

## 2020-07-04 DIAGNOSIS — W11XXXA Fall on and from ladder, initial encounter: Secondary | ICD-10-CM | POA: Diagnosis not present

## 2020-07-04 DIAGNOSIS — M2408 Loose body, other site: Secondary | ICD-10-CM | POA: Diagnosis not present

## 2020-07-04 DIAGNOSIS — S92102A Unspecified fracture of left talus, initial encounter for closed fracture: Secondary | ICD-10-CM | POA: Insufficient documentation

## 2020-07-04 DIAGNOSIS — Z419 Encounter for procedure for purposes other than remedying health state, unspecified: Secondary | ICD-10-CM

## 2020-07-04 HISTORY — PX: ORIF CALCANEOUS FRACTURE: SHX5030

## 2020-07-04 HISTORY — DX: Sleep apnea, unspecified: G47.30

## 2020-07-04 SURGERY — OPEN REDUCTION INTERNAL FIXATION (ORIF) CALCANEOUS FRACTURE
Anesthesia: General | Laterality: Left

## 2020-07-04 MED ORDER — VANCOMYCIN HCL 500 MG IV SOLR
INTRAVENOUS | Status: DC | PRN
  Administered 2020-07-04: 1000 mg

## 2020-07-04 MED ORDER — MIDAZOLAM HCL 5 MG/5ML IJ SOLN
INTRAMUSCULAR | Status: DC | PRN
  Administered 2020-07-04: 2 mg via INTRAVENOUS

## 2020-07-04 MED ORDER — PROPOFOL 10 MG/ML IV BOLUS
INTRAVENOUS | Status: AC
  Filled 2020-07-04: qty 20

## 2020-07-04 MED ORDER — PHENYLEPHRINE HCL (PRESSORS) 10 MG/ML IV SOLN
INTRAVENOUS | Status: DC | PRN
  Administered 2020-07-04 (×4): 80 ug via INTRAVENOUS

## 2020-07-04 MED ORDER — PROPOFOL 10 MG/ML IV BOLUS
INTRAVENOUS | Status: DC | PRN
  Administered 2020-07-04: 200 mg via INTRAVENOUS

## 2020-07-04 MED ORDER — FENTANYL CITRATE (PF) 100 MCG/2ML IJ SOLN
INTRAMUSCULAR | Status: AC
  Administered 2020-07-04: 50 ug via INTRAVENOUS
  Filled 2020-07-04: qty 2

## 2020-07-04 MED ORDER — ORAL CARE MOUTH RINSE: 15.0000 mL | Freq: Once | OROMUCOSAL | Status: AC

## 2020-07-04 MED ORDER — SUGAMMADEX SODIUM 200 MG/2ML IV SOLN
INTRAVENOUS | Status: DC | PRN
  Administered 2020-07-04: 200 mg via INTRAVENOUS

## 2020-07-04 MED ORDER — DEXAMETHASONE SODIUM PHOSPHATE 10 MG/ML IJ SOLN
INTRAMUSCULAR | Status: DC | PRN
  Administered 2020-07-04: 10 mg via INTRAVENOUS

## 2020-07-04 MED ORDER — CELECOXIB 200 MG PO CAPS
ORAL_CAPSULE | ORAL | Status: AC
  Administered 2020-07-04: 200 mg via ORAL
  Filled 2020-07-04: qty 1

## 2020-07-04 MED ORDER — ACETAMINOPHEN 500 MG PO TABS
1000.0000 mg | ORAL_TABLET | Freq: Once | ORAL | Status: AC
Start: 2020-07-04 — End: 2020-07-04

## 2020-07-04 MED ORDER — CHLORHEXIDINE GLUCONATE 0.12 % MT SOLN
15.0000 mL | Freq: Once | OROMUCOSAL | Status: AC
  Administered 2020-07-04: 15 mL via OROMUCOSAL
  Filled 2020-07-04: qty 15

## 2020-07-04 MED ORDER — SUCCINYLCHOLINE CHLORIDE 20 MG/ML IJ SOLN
INTRAMUSCULAR | Status: DC | PRN
  Administered 2020-07-04: 100 mg via INTRAVENOUS

## 2020-07-04 MED ORDER — LACTATED RINGERS IV SOLN: INTRAVENOUS | Status: DC

## 2020-07-04 MED ORDER — PHENYLEPHRINE 40 MCG/ML (10ML) SYRINGE FOR IV PUSH (FOR BLOOD PRESSURE SUPPORT)
PREFILLED_SYRINGE | INTRAVENOUS | Status: AC
  Filled 2020-07-04: qty 10

## 2020-07-04 MED ORDER — ACETAMINOPHEN 500 MG PO TABS
ORAL_TABLET | ORAL | Status: AC
  Administered 2020-07-04: 1000 mg via ORAL
  Filled 2020-07-04: qty 2

## 2020-07-04 MED ORDER — OXYCODONE HCL 5 MG PO TABS
5.0000 mg | ORAL_TABLET | Freq: Three times a day (TID) | ORAL | 0 refills | Status: AC | PRN
Start: 2020-07-04 — End: 2020-07-11

## 2020-07-04 MED ORDER — FENTANYL CITRATE (PF) 250 MCG/5ML IJ SOLN
INTRAMUSCULAR | Status: AC
  Filled 2020-07-04: qty 5

## 2020-07-04 MED ORDER — MIDAZOLAM HCL 2 MG/2ML IJ SOLN
INTRAMUSCULAR | Status: AC
  Administered 2020-07-04: 2 mg via INTRAVENOUS
  Filled 2020-07-04: qty 2

## 2020-07-04 MED ORDER — CEFAZOLIN SODIUM-DEXTROSE 2-4 GM/100ML-% IV SOLN
2.0000 g | INTRAVENOUS | Status: AC
  Administered 2020-07-04: 2 mg via INTRAVENOUS
  Filled 2020-07-04: qty 100

## 2020-07-04 MED ORDER — BUPIVACAINE-EPINEPHRINE (PF) 0.5% -1:200000 IJ SOLN
INTRAMUSCULAR | Status: DC | PRN
  Administered 2020-07-04: 15 mL via PERINEURAL
  Administered 2020-07-04: 30 mL via PERINEURAL

## 2020-07-04 MED ORDER — CELECOXIB 200 MG PO CAPS: 200.0000 mg | ORAL_CAPSULE | Freq: Once | ORAL | Status: AC

## 2020-07-04 MED ORDER — MIDAZOLAM HCL 2 MG/2ML IJ SOLN: 2.0000 mg | Freq: Once | INTRAMUSCULAR | Status: AC

## 2020-07-04 MED ORDER — VANCOMYCIN HCL 1000 MG IV SOLR
INTRAVENOUS | Status: AC
  Filled 2020-07-04: qty 1000

## 2020-07-04 MED ORDER — POVIDONE-IODINE 10 % EX SWAB: 2.0000 "application " | Freq: Once | CUTANEOUS | Status: DC

## 2020-07-04 MED ORDER — ROCURONIUM BROMIDE 10 MG/ML (PF) SYRINGE
PREFILLED_SYRINGE | INTRAVENOUS | Status: DC | PRN
  Administered 2020-07-04: 50 mg via INTRAVENOUS

## 2020-07-04 MED ORDER — 0.9 % SODIUM CHLORIDE (POUR BTL) OPTIME
TOPICAL | Status: DC | PRN
  Administered 2020-07-04: 1000 mL

## 2020-07-04 MED ORDER — MIDAZOLAM HCL 2 MG/2ML IJ SOLN
INTRAMUSCULAR | Status: AC
  Filled 2020-07-04: qty 2

## 2020-07-04 MED ORDER — FENTANYL CITRATE (PF) 100 MCG/2ML IJ SOLN
INTRAMUSCULAR | Status: DC | PRN
  Administered 2020-07-04: 50 ug via INTRAVENOUS

## 2020-07-04 MED ORDER — FENTANYL CITRATE (PF) 100 MCG/2ML IJ SOLN: 50.0000 ug | Freq: Once | INTRAMUSCULAR | Status: AC

## 2020-07-04 SURGICAL SUPPLY — 54 items
APL PRP STRL LF DISP 70% ISPRP (MISCELLANEOUS) ×1
APL SKNCLS STERI-STRIP NONHPOA (GAUZE/BANDAGES/DRESSINGS)
BANDAGE ESMARK 6X9 LF (GAUZE/BANDAGES/DRESSINGS) IMPLANT
BENZOIN TINCTURE PRP APPL 2/3 (GAUZE/BANDAGES/DRESSINGS) IMPLANT
BIT DRILL 3 CANN STRGHT (BIT) ×3
BLADE SURG 15 STRL LF DISP TIS (BLADE) ×2 IMPLANT
BLADE SURG 15 STRL SS (BLADE) ×6
BNDG CMPR 9X6 STRL LF SNTH (GAUZE/BANDAGES/DRESSINGS)
BNDG CMPR MED 10X6 ELC LF (GAUZE/BANDAGES/DRESSINGS) ×1
BNDG ELASTIC 6X10 VLCR STRL LF (GAUZE/BANDAGES/DRESSINGS) ×3 IMPLANT
BNDG ESMARK 6X9 LF (GAUZE/BANDAGES/DRESSINGS)
CHLORAPREP W/TINT 26 (MISCELLANEOUS) ×3 IMPLANT
CLOSURE WOUND 1/2 X4 (GAUZE/BANDAGES/DRESSINGS)
COVER WAND RF STERILE (DRAPES) IMPLANT
CUFF TOURN SGL QUICK 34 (TOURNIQUET CUFF) ×3
CUFF TRNQT CYL 34X4.125X (TOURNIQUET CUFF) ×1 IMPLANT
DECANTER SPIKE VIAL GLASS SM (MISCELLANEOUS) IMPLANT
DRAPE C-ARM 42X72 X-RAY (DRAPES) ×3 IMPLANT
DRAPE C-ARMOR (DRAPES) ×3 IMPLANT
DRAPE EXTREMITY T 121X128X90 (DISPOSABLE) ×3 IMPLANT
DRAPE IMP U-DRAPE 54X76 (DRAPES) ×3 IMPLANT
DRAPE U-SHAPE 47X51 STRL (DRAPES) ×3 IMPLANT
DRILL COUNTERSINK CANN 3 (BIT) IMPLANT
DRSG PAD ABDOMINAL 8X10 ST (GAUZE/BANDAGES/DRESSINGS) IMPLANT
ELECT REM PT RETURN 9FT ADLT (ELECTROSURGICAL) ×3
ELECTRODE REM PT RTRN 9FT ADLT (ELECTROSURGICAL) ×1 IMPLANT
GAUZE SPONGE 4X4 12PLY STRL (GAUZE/BANDAGES/DRESSINGS) ×3 IMPLANT
GAUZE SPONGE 4X4 12PLY STRL LF (GAUZE/BANDAGES/DRESSINGS) ×3 IMPLANT
GLOVE BIOGEL M STRL SZ7.5 (GLOVE) ×3 IMPLANT
GLOVE BIOGEL PI IND STRL 8 (GLOVE) ×1 IMPLANT
GLOVE BIOGEL PI INDICATOR 8 (GLOVE) ×2
GOWN STRL REUS W/ TWL LRG LVL3 (GOWN DISPOSABLE) ×1 IMPLANT
GOWN STRL REUS W/ TWL XL LVL3 (GOWN DISPOSABLE) ×1 IMPLANT
GOWN STRL REUS W/TWL LRG LVL3 (GOWN DISPOSABLE) ×3
GOWN STRL REUS W/TWL XL LVL3 (GOWN DISPOSABLE) ×3
GUIDEWIRE 0.86MM (WIRE) ×4 IMPLANT
KIT BASIN OR (CUSTOM PROCEDURE TRAY) ×3 IMPLANT
NS IRRIG 1000ML POUR BTL (IV SOLUTION) ×3 IMPLANT
PACK ORTHO EXTREMITY (CUSTOM PROCEDURE TRAY) ×3 IMPLANT
PAD CAST 4YDX4 CTTN HI CHSV (CAST SUPPLIES) ×1 IMPLANT
PADDING CAST COTTON 4X4 STRL (CAST SUPPLIES) ×3
PADDING CAST SYNTHETIC 4 (CAST SUPPLIES) ×2
PADDING CAST SYNTHETIC 4X4 STR (CAST SUPPLIES) ×1 IMPLANT
SCREW CANN COMP FT 2.5X22 (Screw) ×2 IMPLANT
SPONGE LAP 18X18 RF (DISPOSABLE) ×2 IMPLANT
STRIP CLOSURE SKIN 1/2X4 (GAUZE/BANDAGES/DRESSINGS) IMPLANT
SUCTION FRAZIER HANDLE 10FR (MISCELLANEOUS) ×3
SUCTION TUBE FRAZIER 10FR DISP (MISCELLANEOUS) ×1 IMPLANT
SUT ETHILON 3 0 PS 1 (SUTURE) ×3 IMPLANT
SUT MNCRL AB 3-0 PS2 18 (SUTURE) ×5 IMPLANT
TOWEL GREEN STERILE FF (TOWEL DISPOSABLE) ×6 IMPLANT
TUBE CONNECTING 20'X1/4 (TUBING) ×2
TUBE CONNECTING 20X1/4 (TUBING) ×4 IMPLANT
UNDERPAD 30X36 HEAVY ABSORB (UNDERPADS AND DIAPERS) ×3 IMPLANT
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
## 2020-07-04 NOTE — Anesthesia Procedure Notes (Signed)
Procedure Name: Intubation Date/Time: 07/04/2020 2:06 PM Performed by: Lavell Luster, CRNA Pre-anesthesia Checklist: Patient identified, Emergency Drugs available, Suction available, Patient being monitored and Timeout performed Patient Re-evaluated:Patient Re-evaluated prior to induction Oxygen Delivery Method: Circle system utilized Preoxygenation: Pre-oxygenation with 100% oxygen Induction Type: IV induction, Rapid sequence and Cricoid Pressure applied Laryngoscope Size: Mac and 3 Grade View: Grade I Tube type: Oral Tube size: 7.5 mm Number of attempts: 1 Airway Equipment and Method: Video-laryngoscopy Placement Confirmation: ETT inserted through vocal cords under direct vision,  positive ETCO2 and breath sounds checked- equal and bilateral Secured at: 21 cm Tube secured with: Tape Dental Injury: Teeth and Oropharynx as per pre-operative assessment  Difficulty Due To: Difficulty was anticipated and Difficult Airway- due to limited oral opening Future Recommendations: Recommend- induction with short-acting agent, and alternative techniques readily available

## 2020-07-04 NOTE — Anesthesia Procedure Notes (Signed)
Anesthesia Regional Block: Popliteal block   Pre-Anesthetic Checklist: ,, timeout performed, Correct Patient, Correct Site, Correct Laterality, Correct Procedure, Correct Position, site marked, Risks and benefits discussed, pre-op evaluation,  At surgeon's request and post-op pain management  Laterality: Left  Prep: Maximum Sterile Barrier Precautions used, chloraprep       Needles:  Injection technique: Single-shot  Needle Type: Echogenic Stimulator Needle     Needle Length: 9cm  Needle Gauge: 21     Additional Needles:   Procedures:,,,, ultrasound used (permanent image in chart),,,,  Narrative:  Start time: 07/04/2020 1:33 PM End time: 07/04/2020 1:43 PM Injection made incrementally with aspirations every 5 mL. Anesthesiologist: Gaynelle Adu, MD  Additional Notes: 2% Lidocaine skin wheel Adductor Canal block with 15cc of 0.5% Bupivicaine w/ 1:200k epi.

## 2020-07-04 NOTE — Transfer of Care (Signed)
Immediate Anesthesia Transfer of Care Note  Patient: Christian Ellison  Procedure(s) Performed: OPEN REDUCTION INTERNAL FIXATION (ORIF) LEFT TALUS AND CALCANEOUS FRACTURE, SUBTALAR JOINT ARTHROTOMY WITH REMOVAL OF LOOSE BODIES, TALONAVICULAR JOINT ARTHOTOMY WITH REMOVAL OF LOOSE BODIES (Left )  Patient Location: PACU  Anesthesia Type:General  Level of Consciousness: awake, alert  and oriented  Airway & Oxygen Therapy: Patient connected to face mask oxygen  Post-op Assessment: Post -op Vital signs reviewed and stable  Post vital signs: stable  Last Vitals:  Vitals Value Taken Time  BP 137/82 07/04/20 1541  Temp    Pulse 82 07/04/20 1543  Resp 8 07/04/20 1543  SpO2 100 % 07/04/20 1543  Vitals shown include unvalidated device data.  Last Pain:  Vitals:   07/04/20 1241  TempSrc:   PainSc: 0-No pain         Complications: No complications documented.

## 2020-07-04 NOTE — Discharge Instructions (Signed)
DR. Kourtnie Sachs FOOT [facility] ANKLE SURGERY POST-OP INSTRUCTIONS   Pain Management 1. The numbing medicine and your leg will last around 18 hours, take a dose of your pain medicine as soon as you feel it wearing off to avoid rebound pain. 2. Keep your foot elevated above heart level.  Make sure that your heel hangs free ('floats'). 3. Take all prescribed medication as directed. 4. If taking narcotic pain medication you may want to use an over-the-counter stool softener to avoid constipation. 5. You may take over-the-counter NSAIDs (ibuprofen, naproxen, etc.) as well as over-the-counter acetaminophen as directed on the packaging as a supplement for your pain and may also use it to wean away from the prescription medication.  Activity ? Non-weightbearing ? Keep splint intact  First Postoperative Visit 1. Your first postop visit will be at least 2 weeks after surgery.  This should be scheduled when you schedule surgery. 2. If you do not have a postoperative visit scheduled please call [phone] to schedule an appointment. 3. At the appointment your incision will be evaluated for suture removal, x-rays will be obtained if necessary.  General Instructions 1. Swelling is very common after foot and ankle surgery.  It often takes 3 months for the foot and ankle to begin to feel comfortable.  Some amount of swelling will persist for 6-12 months. 2. DO NOT change the dressing.  If there is a problem with the dressing (too tight, loose, gets wet, etc.) please contact Dr. Myrel Rappleye's office. 3. DO NOT get the dressing wet.  For showers you can use an over-the-counter cast cover or wrap a washcloth around the top of your dressing and then cover it with a plastic bag and tape it to your leg. 4. DO NOT soak the incision (no tubs, pools, bath, etc.) until you have approval from Dr. Montarius Kitagawa.  Contact Dr. Adairs office or go to Emergency Room if: 1. Temperature above 101 F. 2. Increasing pain that is unresponsive to pain  medication or elevation 3. Excessive redness or swelling in your foot 4. Dressing problems - excessive bloody drainage, looseness or tightness, or if dressing gets wet 5. Develop pain, swelling, warmth, or discoloration of your calf  

## 2020-07-04 NOTE — Anesthesia Postprocedure Evaluation (Signed)
Anesthesia Post Note  Patient: Ancel Easler Kirkendoll  Procedure(s) Performed: OPEN REDUCTION INTERNAL FIXATION (ORIF) LEFT TALUS AND CALCANEOUS FRACTURE, SUBTALAR JOINT ARTHROTOMY WITH REMOVAL OF LOOSE BODIES, TALONAVICULAR JOINT ARTHOTOMY WITH REMOVAL OF LOOSE BODIES (Left )     Patient location during evaluation: PACU Anesthesia Type: General and Regional Level of consciousness: awake and alert Pain management: pain level controlled Vital Signs Assessment: post-procedure vital signs reviewed and stable Respiratory status: spontaneous breathing, nonlabored ventilation and respiratory function stable Cardiovascular status: blood pressure returned to baseline and stable Postop Assessment: no apparent nausea or vomiting Anesthetic complications: no   No complications documented.  Last Vitals:  Vitals:   07/04/20 1556 07/04/20 1611  BP: (!) 145/88 (!) 145/84  Pulse: 71 68  Resp: 17 14  Temp:  36.5 C  SpO2: 96% 100%    Last Pain:  Vitals:   07/04/20 1611  TempSrc:   PainSc: 0-No pain    LLE Motor Response: Purposeful movement (07/04/20 1611) LLE Sensation: Decreased (07/04/20 1611)          Keyarra Rendall,W. EDMOND

## 2020-07-05 ENCOUNTER — Encounter (HOSPITAL_COMMUNITY): Payer: Self-pay | Admitting: Orthopaedic Surgery

## 2020-07-13 ENCOUNTER — Emergency Department (HOSPITAL_COMMUNITY): Payer: 59

## 2020-07-13 ENCOUNTER — Encounter (HOSPITAL_COMMUNITY): Payer: Self-pay

## 2020-07-13 ENCOUNTER — Inpatient Hospital Stay (HOSPITAL_COMMUNITY)
Admission: EM | Admit: 2020-07-13 | Discharge: 2020-07-15 | DRG: 871 | Disposition: A | Discharge status: Home / Self Care | Payer: 59 | Attending: Family Medicine | Admitting: Family Medicine

## 2020-07-13 DIAGNOSIS — G4730 Sleep apnea, unspecified: Secondary | ICD-10-CM | POA: Diagnosis present

## 2020-07-13 DIAGNOSIS — F419 Anxiety disorder, unspecified: Secondary | ICD-10-CM | POA: Diagnosis present

## 2020-07-13 DIAGNOSIS — R6520 Severe sepsis without septic shock: Secondary | ICD-10-CM | POA: Diagnosis present

## 2020-07-13 DIAGNOSIS — E039 Hypothyroidism, unspecified: Secondary | ICD-10-CM | POA: Diagnosis present

## 2020-07-13 DIAGNOSIS — S92009D Unspecified fracture of unspecified calcaneus, subsequent encounter for fracture with routine healing: Secondary | ICD-10-CM | POA: Diagnosis not present

## 2020-07-13 DIAGNOSIS — A419 Sepsis, unspecified organism: Principal | ICD-10-CM

## 2020-07-13 DIAGNOSIS — K219 Gastro-esophageal reflux disease without esophagitis: Secondary | ICD-10-CM | POA: Diagnosis present

## 2020-07-13 DIAGNOSIS — J9601 Acute respiratory failure with hypoxia: Secondary | ICD-10-CM | POA: Diagnosis present

## 2020-07-13 DIAGNOSIS — Z8042 Family history of malignant neoplasm of prostate: Secondary | ICD-10-CM | POA: Diagnosis not present

## 2020-07-13 DIAGNOSIS — J189 Pneumonia, unspecified organism: Secondary | ICD-10-CM | POA: Diagnosis not present

## 2020-07-13 DIAGNOSIS — Z79890 Hormone replacement therapy (postmenopausal): Secondary | ICD-10-CM

## 2020-07-13 DIAGNOSIS — I110 Hypertensive heart disease with heart failure: Secondary | ICD-10-CM | POA: Diagnosis present

## 2020-07-13 DIAGNOSIS — Z800 Family history of malignant neoplasm of digestive organs: Secondary | ICD-10-CM

## 2020-07-13 DIAGNOSIS — J690 Pneumonitis due to inhalation of food and vomit: Secondary | ICD-10-CM | POA: Diagnosis present

## 2020-07-13 DIAGNOSIS — Z79899 Other long term (current) drug therapy: Secondary | ICD-10-CM

## 2020-07-13 DIAGNOSIS — Z8052 Family history of malignant neoplasm of bladder: Secondary | ICD-10-CM | POA: Diagnosis not present

## 2020-07-13 DIAGNOSIS — Z20822 Contact with and (suspected) exposure to covid-19: Secondary | ICD-10-CM | POA: Diagnosis present

## 2020-07-13 DIAGNOSIS — G8929 Other chronic pain: Secondary | ICD-10-CM | POA: Diagnosis present

## 2020-07-13 DIAGNOSIS — Z8249 Family history of ischemic heart disease and other diseases of the circulatory system: Secondary | ICD-10-CM | POA: Diagnosis not present

## 2020-07-13 DIAGNOSIS — Z7982 Long term (current) use of aspirin: Secondary | ICD-10-CM

## 2020-07-13 DIAGNOSIS — R0602 Shortness of breath: Secondary | ICD-10-CM | POA: Diagnosis not present

## 2020-07-13 DIAGNOSIS — Z803 Family history of malignant neoplasm of breast: Secondary | ICD-10-CM | POA: Diagnosis not present

## 2020-07-13 LAB — COMPREHENSIVE METABOLIC PANEL
ALT: 14 U/L (ref 0–44)
AST: 18 U/L (ref 15–41)
Albumin: 4.5 g/dL (ref 3.5–5.0)
Alkaline Phosphatase: 91 U/L (ref 38–126)
Anion gap: 13 (ref 5–15)
BUN: 15 mg/dL (ref 6–20)
CO2: 28 mmol/L (ref 22–32)
Calcium: 9.5 mg/dL (ref 8.9–10.3)
Chloride: 98 mmol/L (ref 98–111)
Creatinine, Ser: 0.88 mg/dL (ref 0.61–1.24)
GFR, Estimated: >60 mL/min (ref >60)
Glucose, Bld: 137 mg/dL — ABNORMAL HIGH (ref 70–99)
Potassium: 3.7 mmol/L (ref 3.5–5.1)
Sodium: 139 mmol/L (ref 135–145)
Total Bilirubin: 0.4 mg/dL (ref 0.3–1.2)
Total Protein: 8.4 g/dL — ABNORMAL HIGH (ref 6.5–8.1)

## 2020-07-13 LAB — URINALYSIS, ROUTINE W REFLEX MICROSCOPIC
Bacteria, UA: NONE SEEN
Bilirubin Urine: NEGATIVE
Glucose, UA: NEGATIVE mg/dL
Ketones, ur: NEGATIVE mg/dL
Leukocytes,Ua: NEGATIVE
Nitrite: NEGATIVE
Protein, ur: NEGATIVE mg/dL
Specific Gravity, Urine: 1.011 (ref 1.005–1.030)
pH: 5 (ref 5.0–8.0)

## 2020-07-13 LAB — CBC WITH DIFFERENTIAL/PLATELET
Abs Immature Granulocytes: 0.07 10*3/uL (ref 0.00–0.07)
Basophils Absolute: 0 10*3/uL (ref 0.0–0.1)
Basophils Relative: 0 %
Eosinophils Absolute: 0.2 10*3/uL (ref 0.0–0.5)
Eosinophils Relative: 2 %
HCT: 45 % (ref 39.0–52.0)
Hemoglobin: 14.3 g/dL (ref 13.0–17.0)
Immature Granulocytes: 1 %
Lymphocytes Relative: 15 %
Lymphs Abs: 1.3 10*3/uL (ref 0.7–4.0)
MCH: 29.7 pg (ref 26.0–34.0)
MCHC: 31.8 g/dL (ref 30.0–36.0)
MCV: 93.4 fL (ref 80.0–100.0)
Monocytes Absolute: 0.3 10*3/uL (ref 0.1–1.0)
Monocytes Relative: 4 %
Neutro Abs: 6.7 10*3/uL (ref 1.7–7.7)
Neutrophils Relative %: 78 %
Platelets: 212 10*3/uL (ref 150–400)
RBC: 4.82 MIL/uL (ref 4.22–5.81)
RDW: 12.3 % (ref 11.5–15.5)
WBC: 8.6 10*3/uL (ref 4.0–10.5)
nRBC: 0 % (ref 0.0–0.2)

## 2020-07-13 LAB — RESP PANEL BY RT-PCR (FLU A&B, COVID) ARPGX2
Influenza A by PCR: NEGATIVE
Influenza B by PCR: NEGATIVE
SARS Coronavirus 2 by RT PCR: NEGATIVE

## 2020-07-13 LAB — CBG MONITORING, ED: Glucose-Capillary: 102 mg/dL — ABNORMAL HIGH (ref 70–99)

## 2020-07-13 LAB — LACTIC ACID, PLASMA
Lactic Acid, Venous: 3.6 mmol/L (ref 0.5–1.9)
Lactic Acid, Venous: 2.3 mmol/L (ref 0.5–1.9)

## 2020-07-13 LAB — PROTIME-INR
INR: 0.9 (ref 0.8–1.2)
Prothrombin Time: 12 seconds (ref 11.4–15.2)

## 2020-07-13 LAB — HIV ANTIBODY (ROUTINE TESTING W REFLEX): HIV Screen 4th Generation wRfx: NONREACTIVE

## 2020-07-13 MED ORDER — DOXYCYCLINE HYCLATE 100 MG PO TABS
100.0000 mg | ORAL_TABLET | Freq: Two times a day (BID) | ORAL | Status: DC
  Administered 2020-07-13: 100 mg via ORAL
  Filled 2020-07-13: qty 1

## 2020-07-13 MED ORDER — ENOXAPARIN SODIUM 40 MG/0.4ML ~~LOC~~ SOLN
40.0000 mg | SUBCUTANEOUS | Status: DC
  Administered 2020-07-13 – 2020-07-15 (×3): 40 mg via SUBCUTANEOUS
  Filled 2020-07-13 (×3): qty 0.4

## 2020-07-13 MED ORDER — VANCOMYCIN HCL 2000 MG/400ML IV SOLN
2000.0000 mg | Freq: Once | INTRAVENOUS | Status: AC
  Administered 2020-07-13: 2000 mg via INTRAVENOUS
  Filled 2020-07-13: qty 400

## 2020-07-13 MED ORDER — VANCOMYCIN HCL IN DEXTROSE 1-5 GM/200ML-% IV SOLN
1000.0000 mg | Freq: Three times a day (TID) | INTRAVENOUS | Status: DC
  Administered 2020-07-13 – 2020-07-15 (×6): 1000 mg via INTRAVENOUS
  Filled 2020-07-13 (×6): qty 200

## 2020-07-13 MED ORDER — VANCOMYCIN HCL IN DEXTROSE 1-5 GM/200ML-% IV SOLN: 1000.0000 mg | Freq: Once | INTRAVENOUS | Status: DC

## 2020-07-13 MED ORDER — OXYCODONE-ACETAMINOPHEN 5-325 MG PO TABS
1.0000 | ORAL_TABLET | Freq: Three times a day (TID) | ORAL | Status: DC | PRN
  Administered 2020-07-14 – 2020-07-15 (×2): 2 via ORAL
  Filled 2020-07-13 (×3): qty 2

## 2020-07-13 MED ORDER — SODIUM CHLORIDE 0.9 % IV SOLN
2.0000 g | Freq: Three times a day (TID) | INTRAVENOUS | Status: DC
  Administered 2020-07-13 – 2020-07-15 (×5): 2 g via INTRAVENOUS
  Filled 2020-07-13 (×7): qty 2

## 2020-07-13 MED ORDER — ACETAMINOPHEN 325 MG PO TABS
650.0000 mg | ORAL_TABLET | Freq: Once | ORAL | Status: AC
  Administered 2020-07-13: 650 mg via ORAL
  Filled 2020-07-13: qty 2

## 2020-07-13 MED ORDER — LACTATED RINGERS IV SOLN
INTRAVENOUS | Status: DC
  Administered 2020-07-15: 100 mL/h via INTRAVENOUS

## 2020-07-13 MED ORDER — ONDANSETRON HCL 4 MG PO TABS: 4.0000 mg | ORAL_TABLET | Freq: Four times a day (QID) | ORAL | Status: DC | PRN

## 2020-07-13 MED ORDER — SODIUM CHLORIDE 0.9 % IV SOLN
2.0000 g | Freq: Once | INTRAVENOUS | Status: AC
  Administered 2020-07-13: 2 g via INTRAVENOUS
  Filled 2020-07-13: qty 2

## 2020-07-13 MED ORDER — DILTIAZEM HCL ER BEADS 120 MG PO CP24
120.0000 mg | ORAL_CAPSULE | Freq: Every day | ORAL | Status: DC
  Administered 2020-07-13: 120 mg via ORAL
  Filled 2020-07-13 (×4): qty 1

## 2020-07-13 MED ORDER — LEVOTHYROXINE SODIUM 25 MCG PO TABS
25.0000 ug | ORAL_TABLET | Freq: Every day | ORAL | Status: DC
  Administered 2020-07-13 – 2020-07-15 (×3): 25 ug via ORAL
  Filled 2020-07-13 (×3): qty 1

## 2020-07-13 MED ORDER — ACETAMINOPHEN 650 MG RE SUPP: 650.0000 mg | Freq: Four times a day (QID) | RECTAL | Status: DC | PRN

## 2020-07-13 MED ORDER — ACETAMINOPHEN 325 MG PO TABS
650.0000 mg | ORAL_TABLET | Freq: Four times a day (QID) | ORAL | Status: DC | PRN
  Administered 2020-07-14: 650 mg via ORAL
  Filled 2020-07-13: qty 2

## 2020-07-13 MED ORDER — ASPIRIN EC 81 MG PO TBEC
81.0000 mg | DELAYED_RELEASE_TABLET | Freq: Every day | ORAL | Status: DC
  Administered 2020-07-13 – 2020-07-14 (×2): 81 mg via ORAL
  Filled 2020-07-13 (×3): qty 1

## 2020-07-13 MED ORDER — ALPRAZOLAM 0.5 MG PO TABS
1.0000 mg | ORAL_TABLET | Freq: Once | ORAL | Status: AC
  Administered 2020-07-13: 1 mg via ORAL
  Filled 2020-07-13: qty 2

## 2020-07-13 MED ORDER — LACTATED RINGERS IV BOLUS (SEPSIS)
2000.0000 mL | Freq: Once | INTRAVENOUS | Status: AC
  Administered 2020-07-13: 2000 mL via INTRAVENOUS

## 2020-07-13 MED ORDER — CHLORHEXIDINE GLUCONATE CLOTH 2 % EX PADS: 6.0000 | MEDICATED_PAD | Freq: Every day | CUTANEOUS | Status: DC

## 2020-07-13 MED ORDER — LACTATED RINGERS IV SOLN: INTRAVENOUS | Status: DC

## 2020-07-13 MED ORDER — PANTOPRAZOLE SODIUM 40 MG PO TBEC
40.0000 mg | DELAYED_RELEASE_TABLET | Freq: Two times a day (BID) | ORAL | Status: DC
  Administered 2020-07-13 – 2020-07-15 (×5): 40 mg via ORAL
  Filled 2020-07-13 (×6): qty 1

## 2020-07-13 MED ORDER — ONDANSETRON HCL 4 MG/2ML IJ SOLN: 4.0000 mg | Freq: Four times a day (QID) | INTRAMUSCULAR | Status: DC | PRN

## 2020-07-13 NOTE — Progress Notes (Addendum)
Pharmacy Antibiotic Note  Christian Ellison is a 59 y.o. male admitted on 07/13/2020 with pneumonia.  Pharmacy has been consulted for cefepime and vancomycin dosing.  Plan: cefepime 2gm iv q8h Vancomycin 2gm x 1 followed by vancomycin 1gm iv q8h Height: 6' (182.9 cm) Weight: 89 kg (196 lb 3.4 oz) IBW/kg (Calculated) : 77.6  Temp (24hrs), Avg:104.5 F (40.3 C), Min:104.5 F (40.3 C), Max:104.5 F (40.3 C)  Recent Labs  Lab 07/13/20 0855  WBC 8.6    Estimated Creatinine Clearance: 110.5 mL/min (by C-G formula based on SCr of 0.78 mg/dL).    No Known Allergies  Antimicrobials this admission: 11/27 cefepime >>  11/27 vancomycin >>  Microbiology results: 11/27 BCx: sent    Thank you for allowing pharmacy to be a part of this patient's care.  Christian Ellison 07/13/2020 9:31 AM

## 2020-07-13 NOTE — ED Notes (Addendum)
  Pt given vancomycin 1327 and MAR is stating med is now at 1900. The medication is every 8 hours so next dose will be due at 2100. Floor was informed in report to give med upon arrival.

## 2020-07-13 NOTE — ED Provider Notes (Addendum)
[facility] EMERGENCY DEPARTMENT Provider Note   CSN: [id number] Arrival date & time: 07/13/20  7829     History Chief Complaint  Patient presents with  . Shortness of Breath    Christian Ellison is a 59 y.o. male.  HPI    59 year old male comes in a chief complaint of shortness of breath.  Patient is slightly confused, level five caveat.  He has no complaints from his side.  I spoke with patient's wife.  She reports that patient has had history of aspiration pneumonia secondary to esophageal stricture and dysmotility.  Patient has also turned septic overnight in the past when he had pneumonia.  Recently patient found out more detail about his son's death in the [employer], and he regressed mentally and emotionally with the news.  She suspects that patient likely had aspiration the way he was found this morning.  Patient typically uses CPAP at night.  This morning he was noted to be hypoxic by her.  Patient does not require oxygen during the day.  He was doing well yesterday.  They are up-to-date with COVID-19 vaccine.  No other sick contacts.  Of note, patient had an ankle surgery recently.  His ankle has been broken for the last 3 weeks.  There is no history of PE, DVT.   Past Medical History:  Diagnosis Date  . Anxiety   . Aspiration pneumonia (HCC) 05/26/2018   RLL  . Chronic pain   . Esophageal stricture   . GERD (gastroesophageal reflux disease)   . 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   Marland Kitchen Opiate abuse, continuous (HCC)   . Sleep apnea    CPAP nightly    Patient Active Problem List   Diagnosis Date Noted  . Pneumonia 06/24/2019  . Pneumonitis 06/24/2019  . Aspiration pneumonitis (HCC) 06/10/2019  . Closed fracture of xiphoid process   . PNA (pneumonia) 05/15/2019  . Multifocal pneumonia 03/11/2019  . Acute respiratory failure with hypoxemia (HCC) 03/11/2019  . Sinus tachycardia 03/11/2019  . Sepsis due to pneumonia (HCC) 03/11/2019  . Hypothyroidism  03/11/2019  . Bronchopneumonia 11/06/2018  . Acute Respiratory failure with hypoxia (2/2 Asp PNA) 11/06/2018  . Recurrent aspiration pneumonia (HCC)   . Fever 06/09/2018  . Tachycardia 06/09/2018  . Hypoxia 06/09/2018  . Hypotension 06/09/2018  . Esophageal motility disorder   . Aspiration pneumonia of right lower lobe (HCC)   . Sepsis (HCC) 05/26/2018  . Pulmonary edema 02/02/2018  . HCAP (healthcare-associated pneumonia) 02/02/2018  . Weakness generalized 02/02/2018  . Hypertension 02/02/2018  . CHF (congestive heart failure) (HCC) 02/02/2018  . Nausea & vomiting 02/02/2018  . Dysphagia 02/02/2018  . Esophageal stricture/stenosis/esophageal dysmotility with recurrent aspiration 02/02/2018  . Esophageal stenosis 02/02/2018  . GERD (gastroesophageal reflux disease) 02/02/2018  . Polysubstance abuse (HCC) 02/02/2018  . Acute respiratory failure with hypoxia (HCC)   . Transaminitis   . Elevated troponin   . Overdose 01/02/2018  . Hx of hepatitis C 01/17/2013  . Depression 01/17/2013  . Anxiety with depression 01/17/2013  . Right knee pain 01/17/2013    Past Surgical History:  Procedure Laterality Date  . BIOPSY  03/18/2018   Procedure: BIOPSY;  Surgeon: Malissa Hippo, MD;  Location: [facility];  Service: Endoscopy;;  gastric   . COLONOSCOPY N/A 11/22/2013   Dr. Karilyn Cota: Normal except for hemorrhoids.  Next colonoscopy 10 years.  . ESOPHAGEAL DILATION N/A 03/18/2018   Procedure: ESOPHAGEAL DILATION;  Surgeon: Malissa Hippo,  MD;  Location: [facility];  Service: Endoscopy;  Laterality: N/A;  . ESOPHAGEAL MANOMETRY N/A 06/15/2018   Procedure: ESOPHAGEAL MANOMETRY (EM);  Surgeon: Napoleon Form, MD;  Location: [facility];  Service: Endoscopy;  Laterality: N/A;  . ESOPHAGOGASTRODUODENOSCOPY (EGD) WITH PROPOFOL N/A 03/18/2018   Dr. Karilyn Cota: Benign-appearing mild esophageal stenosis proximal to the GE J, status post dilation.  2 cm hiatal hernia.  Gastritis, biopsies  negative for H. pylori  . Fracture Right Leg     Patient has rod/screws in this leg  . ORIF CALCANEOUS FRACTURE Left 07/04/2020   Procedure: OPEN REDUCTION INTERNAL FIXATION (ORIF) LEFT TALUS AND CALCANEOUS FRACTURE, SUBTALAR JOINT ARTHROTOMY WITH REMOVAL OF LOOSE BODIES, TALONAVICULAR JOINT ARTHOTOMY WITH REMOVAL OF LOOSE BODIES;  Surgeon: Terance Hart, MD;  Location: [facility] OR;  Service: Orthopedics;  Laterality: Left;  . Rotor Cuff  2012   Right Shoulder       Family History  Problem Relation Age of Onset  . Breast cancer Mother   . Colon cancer Father   . Bladder Cancer Father   . Prostate cancer Father   . Hypertension Sister   . Hypothyroidism Sister   . Healthy Sister   . Healthy Daughter   . Healthy Son   . Healthy Son     Social History   Tobacco Use  . Smoking status: Never Smoker  . Smokeless tobacco: Never Used  Vaping Use  . Vaping Use: Never used  Substance Use Topics  . Alcohol use: Not Currently  . Drug use: Not Currently    Types: Benzodiazepines    Comment: opiates    Home Medications Prior to Admission medications   Medication Sig Start Date End Date Taking? Authorizing Provider  acetaminophen (TYLENOL) 325 MG tablet Take 2 tablets (650 mg total) by mouth every 6 (six) hours as needed for mild pain (or Fever >/= 101). 05/17/19   Shon Hale, MD  ALPRAZolam Prudy Feeler) 1 MG tablet Take 1 mg by mouth 4 (four) times daily as needed for anxiety.  10/22/19   [provider]  aspirin EC 81 MG tablet Take 1 tablet (81 mg total) by mouth daily with breakfast. Patient taking differently: Take 81 mg by mouth at bedtime.  03/13/19   Shon Hale, MD  diltiazem (TIAZAC) 120 MG 24 hr capsule Take 120 mg by mouth daily.    [provider]  Glucosamine HCl (GLUCOSAMINE PO) Take 1 tablet by mouth at bedtime.     [provider]  levothyroxine (SYNTHROID, LEVOTHROID) 25 MCG tablet Take 25 mcg by mouth daily. 05/13/18   [provider]  Multiple Vitamin (MULTIVITAMIN WITH MINERALS) TABS tablet Take 1 tablet by mouth daily. 50+    [provider]  naproxen (NAPROSYN) 500 MG tablet Take 1 tablet (500 mg total) by mouth 2 (two) times daily with a meal. Patient taking differently: Take 500 mg by mouth every 8 (eight) hours.  03/26/20   Darreld Mclean, MD  oxyCODONE-acetaminophen (PERCOCET/ROXICET) 5-325 MG tablet Take 1-2 tablets by mouth every 8 (eight) hours as needed for severe pain. 06/20/20   Maxwell Caul, PA-C  pantoprazole (PROTONIX) 40 MG tablet Take 1 tablet (40 mg total) by mouth 2 (two) times daily. 05/17/19 06/26/20  Shon Hale, MD    Allergies    Patient has no known allergies.  Review of Systems   Review of Systems  Unable to perform ROS: Acuity of condition    Physical Exam Updated Vital Signs BP  127/70   Pulse (!) 128   Temp (!) 104.5 F (40.3 C) (Oral)   Resp 19   Ht 6' (1.829 m)   Wt 89 kg   SpO2 100%   BMI 26.61 kg/m   Physical Exam Vitals and nursing note reviewed.  Constitutional:      Appearance: He is ill-appearing. He is not toxic-appearing.  HENT:     Head: Atraumatic.  Cardiovascular:     Rate and Rhythm: Tachycardia present.  Pulmonary:     Effort: Pulmonary effort is normal.     Breath sounds: Rhonchi present. No decreased breath sounds, wheezing or rales.  Musculoskeletal:     Cervical back: Neck supple.     Comments: Left leg is in a splint  Skin:    General: Skin is warm.  Neurological:     Mental Status: He is oriented to person, place, and time.     ED Results / Procedures / Treatments   Labs (all labs ordered are listed, but only abnormal results are displayed) Labs Reviewed  COMPREHENSIVE METABOLIC PANEL - Abnormal; Notable for the following components:      Result Value   Glucose, Bld 137 (*)    Total Protein 8.4 (*)    All other components within normal limits  LACTIC ACID, PLASMA - Abnormal; Notable for the following  components:   Lactic Acid, Venous 3.6 (*)    All other components within normal limits  URINALYSIS, ROUTINE W REFLEX MICROSCOPIC - Abnormal; Notable for the following components:   Color, Urine STRAW (*)    Hgb urine dipstick SMALL (*)    All other components within normal limits  CULTURE, BLOOD (ROUTINE X 2)  CULTURE, BLOOD (ROUTINE X 2)  RESP PANEL BY RT-PCR (FLU A&B, COVID) ARPGX2  MRSA PCR SCREENING  CBC WITH DIFFERENTIAL/PLATELET  PROTIME-INR  LACTIC ACID, PLASMA    EKG EKG Interpretation  Date/Time:  Saturday July 13 2020 08:42:53 EST Ventricular Rate:  139 PR Interval:    QRS Duration: 78 QT Interval:  264 QTC Calculation: 402 R Axis:   -32 Text Interpretation: Sinus tachycardia Left axis deviation No acute changes profount tachycardia noted Confirmed by Derwood Kaplan [id number]) on 07/13/2020 9:26:08 AM   Radiology DG Chest Portable 1 View  Result Date: 07/13/2020 CLINICAL DATA:  Shortness of breath and chest pain. EXAM: PORTABLE CHEST 1 VIEW COMPARISON:  06/12/2020. FINDINGS: Normal heart size. No pleural effusion. Increased reticular interstitial opacities are identified with a lower lung zone predominance. Superimposed nodular densities are identified within both lower lobes, increased from previous exam. IMPRESSION: Increase lower lung zone reticular and nodular interstitial opacities are concerning for either aspiration or multifocal pneumonia. Followup PA and lateral chest X-ray is recommended in 3-4 weeks following trial of antibiotic therapy to ensure resolution and exclude underlying malignancy. Electronically Signed   By: Signa Kell M.D.   On: 07/13/2020 09:53    Procedures .Critical Care Performed by: Derwood Kaplan, MD Authorized by: Derwood Kaplan, MD   Critical care provider statement:    Critical care time (minutes):  52   Critical care was necessary to treat or prevent imminent or life-threatening deterioration of the following conditions:   Respiratory failure   Critical care was time spent personally by me on the following activities:  Discussions with consultants, evaluation of patient's response to treatment, examination of patient, ordering and performing treatments and interventions, ordering and review of laboratory studies, ordering and review of radiographic studies, pulse oximetry, re-evaluation of patient's condition,  obtaining history from patient or surrogate and review of old charts   (including critical care time)  Medications Ordered in ED Medications  lactated ringers infusion ( Intravenous New Bag/Given 07/13/20 0918)  ceFEPIme (MAXIPIME) 2 g in sodium chloride 0.9 % 100 mL IVPB (has no administration in time range)  doxycycline (VIBRA-TABS) tablet 100 mg (has no administration in time range)  lactated ringers bolus 2,000 mL (2,000 mLs Intravenous New Bag/Given 07/13/20 1020)  vancomycin (VANCOREADY) IVPB 2000 mg/400 mL (has no administration in time range)  vancomycin (VANCOCIN) IVPB 1000 mg/200 mL premix (has no administration in time range)  ceFEPIme (MAXIPIME) 2 g in sodium chloride 0.9 % 100 mL IVPB (0 g Intravenous Stopped 07/13/20 1019)  acetaminophen (TYLENOL) tablet 650 mg (650 mg Oral Given 07/13/20 0945)    ED Course  I have reviewed the triage vital signs and the nursing notes.  Pertinent labs & imaging results that were available during my care of the patient were reviewed by me and considered in my medical decision making (see chart for details).  Clinical Course as of Jul 14 1035  Sat Jul 13, 2020  1016 X-ray reviewed.  Patient has signs of multifocal pneumonia.  Wife reports that patient has had MRSA infection in the past as well.  Cefepime, vancomycin, doxycycline ordered  DG Chest Portable 1 View [AN]  1016 Elevated lactate in the setting of pneumonia.  2 L of fluid bolus ordered.  Lactic Acid, Venous(!!): 3.6 [AN]  1035 Patient appears more comfortable.  Breathing about 25-30 times a  minute.  No respiratory distress with tachypnea.  Stable for admission.   [AN]    Clinical Course User Index [AN] Derwood Kaplan, MD   MDM Rules/Calculators/A&P                          59 year old male comes in a chief complaint of respiratory distress.  Patient is noted to be in hypoxic respiratory failure.  He is now on nonrebreather, 15 L and his O2 sats have responded.  He is tachycardic, tachypneic, febrile.  Code sepsis activated.  Patient has rhonchi on exam and I suspect he has pneumonia.  There appears to be history of aspiration pneumonia.  Patient is not providing meaningful history.  He is oriented to self, location and time.  He appears slightly confused about the events surrounding his illness, therefore we will discussed the case with his wife to get appropriate information.  PE considered in the differential.  We will review the x-ray and if it looks fairly clear then we will pursue CT PE.  We will also remove the splint if that is the case to ensure there is no infection of the surgical wound site.   Christian Ellison was evaluated in Emergency Department on 07/13/2020 for the symptoms described in the history of present illness. He was evaluated in the context of the global COVID-19 pandemic, which necessitated consideration that the patient might be at risk for infection with the SARS-CoV-2 virus that causes COVID-19. Institutional protocols and algorithms that pertain to the evaluation of patients at risk for COVID-19 are in a state of rapid change based on information released by regulatory bodies including the CDC and federal and state organizations. These policies and algorithms were followed during the patient's care in the ED.   Final Clinical Impression(s) / ED Diagnoses Final diagnoses:  Acute hypoxemic respiratory failure (HCC)  Severe sepsis (HCC)  Community acquired pneumonia, unspecified  laterality    Rx / DC Orders ED Discharge Orders    None        Derwood KaplanNanavati, Janicia Monterrosa, MD 07/13/20 1017    Derwood KaplanNanavati, Daden Mahany, MD 07/13/20 1036

## 2020-07-13 NOTE — ED Notes (Signed)
CRITICAL VALUE ALERT  Critical Value:  Lactic Acid 3.6  Date & Time Notied:  07/13/2020 0932  Provider Notified: Dr. Rhunette Croft  Orders Received/Actions taken: see chart

## 2020-07-13 NOTE — Progress Notes (Signed)
Elink monitoring for code sepsis. Maxipime given before [facility], but also given before code sepsis called and BC ordered.

## 2020-07-13 NOTE — ED Triage Notes (Signed)
Pt woke up with CP and SOB. Stas on cpap 72%. EMS started NRB 15 L. sats up to  97% Temp in  Triage 104.5 . Pt has cast t0 left lower leg from sx

## 2020-07-13 NOTE — H&P (Signed)
TRH H&P    Patient Demographics:    Christian Ellison, is a 59 y.o. male  MRN: [id number]  DOB - [birth year]  Admit Date - 07/13/2020  Referring MD/NP/PA: Dr Rhunette Croft  Outpatient Primary MD for the patient is Jason Coop, FNP  Patient coming from: Home  Chief complaint-shortness of breath   HPI:    Christian Ellison  is a 59 y.o. male, with history of esophageal stricture and dysmotility, aspiration pneumonia in the past, recent left talus and calcaneus fracture s/p ORIF, came to ED with complaints of shortness of breath.  Patient is on CPAP at night.  He is up-to-date with COVID-19 vaccine.  There is no previous history of DVT/PE.  Chest x-ray shows multifocal pneumonia.  Patient started on vancomycin, cefepime, doxycycline.  He denies chest pain, nausea, vomiting. He denies abdominal pain or dysuria.    Review of systems:    In addition to the HPI above,    All other systems reviewed and are negative.    Past History of the following :    Past Medical History:  Diagnosis Date  . Anxiety   . Aspiration pneumonia (HCC) 05/26/2018   RLL  . Chronic pain   . Esophageal stricture   . GERD (gastroesophageal reflux disease)   . 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   Marland Kitchen Opiate abuse, continuous (HCC)   . Sleep apnea    CPAP nightly      Past Surgical History:  Procedure Laterality Date  . BIOPSY  03/18/2018   Procedure: BIOPSY;  Surgeon: Malissa Hippo, MD;  Location: [facility];  Service: Endoscopy;;  gastric   . COLONOSCOPY N/A 11/22/2013   Dr. Karilyn Cota: Normal except for hemorrhoids.  Next colonoscopy 10 years.  . ESOPHAGEAL DILATION N/A 03/18/2018   Procedure: ESOPHAGEAL DILATION;  Surgeon: Malissa Hippo, MD;  Location: [facility];  Service: Endoscopy;  Laterality: N/A;  . ESOPHAGEAL MANOMETRY N/A 06/15/2018   Procedure: ESOPHAGEAL MANOMETRY (EM);  Surgeon:  Napoleon Form, MD;  Location: [facility];  Service: Endoscopy;  Laterality: N/A;  . ESOPHAGOGASTRODUODENOSCOPY (EGD) WITH PROPOFOL N/A 03/18/2018   Dr. Karilyn Cota: Benign-appearing mild esophageal stenosis proximal to the GE J, status post dilation.  2 cm hiatal hernia.  Gastritis, biopsies negative for H. pylori  . Fracture Right Leg     Patient has rod/screws in this leg  . ORIF CALCANEOUS FRACTURE Left 07/04/2020   Procedure: OPEN REDUCTION INTERNAL FIXATION (ORIF) LEFT TALUS AND CALCANEOUS FRACTURE, SUBTALAR JOINT ARTHROTOMY WITH REMOVAL OF LOOSE BODIES, TALONAVICULAR JOINT ARTHOTOMY WITH REMOVAL OF LOOSE BODIES;  Surgeon: Terance Hart, MD;  Location: [facility];  Service: Orthopedics;  Laterality: Left;  . Rotor Cuff  2012   Right Shoulder      Social History:      Social History   Tobacco Use  . Smoking status: Never Smoker  . Smokeless tobacco: Never Used  Substance Use Topics  . Alcohol use: Not Currently       Family History :  Family History  Problem Relation Age of Onset  . Breast cancer Mother   . Colon cancer Father   . Bladder Cancer Father   . Prostate cancer Father   . Hypertension Sister   . Hypothyroidism Sister   . Healthy Sister   . Healthy Daughter   . Healthy Son   . Healthy Son       Home Medications:   Prior to Admission medications   Medication Sig Start Date End Date Taking? Authorizing Provider  acetaminophen (TYLENOL) 325 MG tablet Take 2 tablets (650 mg total) by mouth every 6 (six) hours as needed for mild pain (or Fever >/= 101). 05/17/19   Shon HaleEmokpae, Courage, MD  ALPRAZolam Prudy Feeler(XANAX) 1 MG tablet Take 1 mg by mouth 4 (four) times daily as needed for anxiety.  10/22/19   [provider]  aspirin EC 81 MG tablet Take 1 tablet (81 mg total) by mouth daily with breakfast. Patient taking differently: Take 81 mg by mouth at bedtime.  03/13/19   Shon HaleEmokpae, Courage, MD  diltiazem (TIAZAC) 120 MG 24 hr capsule Take 120 mg by mouth  daily.    [provider]  Glucosamine HCl (GLUCOSAMINE PO) Take 1 tablet by mouth at bedtime.     [provider]  levothyroxine (SYNTHROID, LEVOTHROID) 25 MCG tablet Take 25 mcg by mouth daily. 05/13/18   [provider]  Multiple Vitamin (MULTIVITAMIN WITH MINERALS) TABS tablet Take 1 tablet by mouth daily. 50+    [provider]  naproxen (NAPROSYN) 500 MG tablet Take 1 tablet (500 mg total) by mouth 2 (two) times daily with a meal. Patient taking differently: Take 500 mg by mouth every 8 (eight) hours.  03/26/20   Darreld McleanKeeling, Wayne, MD  oxyCODONE-acetaminophen (PERCOCET/ROXICET) 5-325 MG tablet Take 1-2 tablets by mouth every 8 (eight) hours as needed for severe pain. 06/20/20   Maxwell CaulLayden, Lindsey A, PA-C  pantoprazole (PROTONIX) 40 MG tablet Take 1 tablet (40 mg total) by mouth 2 (two) times daily. 05/17/19 06/26/20  Shon HaleEmokpae, Courage, MD     Allergies:    No Known Allergies   Physical Exam:   Vitals  Blood pressure 111/62, pulse (!) 112, temperature (!) 100.6 F (38.1 C), temperature source Oral, resp. rate (!) 27, height 6' (1.829 m), weight 89 kg, SpO2 100 %.  1.  General: Appears in no acute distress   2. Psychiatric: Alert, oriented x3, intact insight and judgment  3. Neurologic: Cranial nerves II through grossly intact, no focal deficit noted  4. HEENMT:  Atraumatic normocephalic, extraocular muscle intact  5. Respiratory : Bilateral rhonchi auscultated  6. Cardiovascular : S1-S2, regular, no murmur auscultated  7. Gastrointestinal:  Abdomen is soft, nontender, no organomegaly     Data Review:    CBC Recent Labs  Lab 07/13/20 0855  WBC 8.6  HGB 14.3  HCT 45.0  PLT 212  MCV 93.4  MCH 29.7  MCHC 31.8  RDW 12.3  LYMPHSABS 1.3  MONOABS 0.3  EOSABS 0.2  BASOSABS 0.0   ------------------------------------------------------------------------------------------------------------------  Results for orders placed or performed  during the hospital encounter of 07/13/20 (from the past 48 hour(s))  Comprehensive metabolic panel     Status: Abnormal   Collection Time: 07/13/20  8:55 AM  Result Value Ref Range   Sodium 139 135 - 145 mmol/L   Potassium 3.7 3.5 - 5.1 mmol/L   Chloride 98 98 - 111 mmol/L   CO2 28 22 - 32 mmol/L   Glucose, Bld 137 (  H) 70 - 99 mg/dL    Comment: Glucose reference range applies only to samples taken after fasting for at least 8 hours.   BUN 15 6 - 20 mg/dL   Creatinine, Ser 1.61 0.61 - 1.24 mg/dL   Calcium 9.5 8.9 - 09.6 mg/dL   Total Protein 8.4 (H) 6.5 - 8.1 g/dL   Albumin 4.5 3.5 - 5.0 g/dL   AST 18 15 - 41 U/L   ALT 14 0 - 44 U/L   Alkaline Phosphatase 91 38 - 126 U/L   Total Bilirubin 0.4 0.3 - 1.2 mg/dL   GFR, Estimated >04 >54 mL/min    Comment: (NOTE) Calculated using the CKD-EPI Creatinine Equation (2021)    Anion gap 13 5 - 15    Comment: Performed at [facility], [address] [place], [place] [address]  Lactic acid, plasma     Status: Abnormal   Collection Time: 07/13/20  8:55 AM  Result Value Ref Range   Lactic Acid, Venous 3.6 (HH) 0.5 - 1.9 mmol/L    Comment: CRITICAL RESULT CALLED TO, READ BACK BY AND VERIFIED WITH: DOSS M. @ 0932 ON [id number] BY HENDERSON L. Performed at [facility], [address] [place], [place] [address]   CBC with Differential     Status: None   Collection Time: 07/13/20  8:55 AM  Result Value Ref Range   WBC 8.6 4.0 - 10.5 K/uL   RBC 4.82 4.22 - 5.81 MIL/uL   Hemoglobin 14.3 13.0 - 17.0 g/dL   HCT 30.8 39 - 52 %   MCV 93.4 80.0 - 100.0 fL   MCH 29.7 26.0 - 34.0 pg   MCHC 31.8 30.0 - 36.0 g/dL   RDW 65.7 84.6 - 96.2 %   Platelets 212 150 - 400 K/uL   nRBC 0.0 0.0 - 0.2 %   Neutrophils Relative % 78 %   Neutro Abs 6.7 1.7 - 7.7 K/uL   Lymphocytes Relative 15 %   Lymphs Abs 1.3 0.7 - 4.0 K/uL   Monocytes Relative 4 %   Monocytes Absolute 0.3 0.1 - 1.0 K/uL   Eosinophils Relative 2 %   Eosinophils Absolute 0.2 0.0 - 0.5 K/uL    Basophils Relative 0 %   Basophils Absolute 0.0 0.0 - 0.1 K/uL   Immature Granulocytes 1 %   Abs Immature Granulocytes 0.07 0.00 - 0.07 K/uL    Comment: Performed at [facility], [address], [place], [place] [address]  Protime-INR     Status: None   Collection Time: 07/13/20  8:55 AM  Result Value Ref Range   Prothrombin Time 12.0 11.4 - 15.2 seconds   INR 0.9 0.8 - 1.2    Comment: (NOTE) INR goal varies based on device and disease states. Performed at [facility], [address] [place], [place] [address]   Urinalysis, Routine w reflex microscopic Urine, Clean Catch     Status: Abnormal   Collection Time: 07/13/20  8:55 AM  Result Value Ref Range   Color, Urine STRAW (A) YELLOW   APPearance CLEAR CLEAR   Specific Gravity, Urine 1.011 1.005 - 1.030   pH 5.0 5.0 - 8.0   Glucose, UA NEGATIVE NEGATIVE mg/dL   Hgb urine dipstick SMALL (A) NEGATIVE   Bilirubin Urine NEGATIVE NEGATIVE   Ketones, ur NEGATIVE NEGATIVE mg/dL   Protein, ur NEGATIVE NEGATIVE mg/dL   Nitrite NEGATIVE NEGATIVE   Leukocytes,Ua NEGATIVE NEGATIVE   RBC / HPF 0-5 0 - 5 RBC/hpf  Bacteria, UA NONE SEEN NONE SEEN   Squamous Epithelial / LPF 0-5 0 - 5    Comment: Performed at [facility], [address], [place], [place] [address]  Resp Panel by RT-PCR (Flu A&B, Covid) Nasopharyngeal Swab     Status: None   Collection Time: 07/13/20  9:27 AM   Specimen: Nasopharyngeal Swab; Nasopharyngeal(NP) swabs in vial transport medium  Result Value Ref Range   SARS Coronavirus 2 by RT PCR NEGATIVE NEGATIVE    Comment: (NOTE) SARS-CoV-2 target nucleic acids are NOT DETECTED.  The SARS-CoV-2 RNA is generally detectable in upper respiratory specimens during the acute phase of infection. The lowest concentration of SARS-CoV-2 viral copies this assay can detect is 138 copies/mL. A negative result does not preclude SARS-Cov-2 infection and should not be used as the sole basis for treatment or other patient management  decisions. A negative result may occur with  improper specimen collection/handling, submission of specimen other than nasopharyngeal swab, presence of viral mutation(s) within the areas targeted by this assay, and inadequate number of viral copies(<138 copies/mL). A negative result must be combined with clinical observations, patient history, and epidemiological information. The expected result is Negative.  Fact Sheet for Patients:  BloggerCourse.com  Fact Sheet for Healthcare Providers:  SeriousBroker.it  This test is no t yet approved or cleared by the [place] FDA and  has been authorized for detection and/or diagnosis of SARS-CoV-2 by FDA under an Emergency Use Authorization (EUA). This EUA will remain  in effect (meaning this test can be used) for the duration of the COVID-19 declaration under Section 564(b)(1) of the Act, 21 U.S.C.section 360bbb-3(b)(1), unless the authorization is terminated  or revoked sooner.       Influenza A by PCR NEGATIVE NEGATIVE   Influenza B by PCR NEGATIVE NEGATIVE    Comment: (NOTE) The Xpert Xpress SARS-CoV-2/FLU/RSV plus assay is intended as an aid in the diagnosis of influenza from Nasopharyngeal swab specimens and should not be used as a sole basis for treatment. Nasal washings and aspirates are unacceptable for Xpert Xpress SARS-CoV-2/FLU/RSV testing.  Fact Sheet for Patients: BloggerCourse.com  Fact Sheet for Healthcare Providers: SeriousBroker.it  This test is not yet approved or cleared by the [place] FDA and has been authorized for detection and/or diagnosis of SARS-CoV-2 by FDA under an Emergency Use Authorization (EUA). This EUA will remain in effect (meaning this test can be used) for the duration of the COVID-19 declaration under Section 564(b)(1) of the Act, 21 U.S.C. section 360bbb-3(b)(1), unless the authorization  is terminated or revoked.  Performed at [facility], [address], [place], [place] [address]   Lactic acid, plasma     Status: Abnormal   Collection Time: 07/13/20 11:17 AM  Result Value Ref Range   Lactic Acid, Venous 2.3 (HH) 0.5 - 1.9 mmol/L    Comment: CRITICAL RESULT CALLED TO, READ BACK BY AND VERIFIED WITH: DOSS M. @ 1145 ON [id number] BY HENDERSON L. Performed at [facility], [address], [place], [place] [address]     Chemistries  Recent Labs  Lab 07/13/20 0855  NA 139  K 3.7  CL 98  CO2 28  GLUCOSE 137*  BUN 15  CREATININE 0.88  CALCIUM 9.5  AST 18  ALT 14  ALKPHOS 91  BILITOT 0.4   ------------------------------------------------------------------------------------------------------------------  ------------------------------------------------------------------------------------------------------------------ GFR: Estimated Creatinine Clearance: 100.4 mL/min (by C-G formula based on SCr of 0.88 mg/dL). Liver Function Tests: Recent Labs  Lab 07/13/20 0855  AST 18  ALT 14  ALKPHOS 91  BILITOT 0.4  PROT 8.4*  ALBUMIN 4.5   No results for input(s): LIPASE, AMYLASE in the last 168 hours. No results for input(s): AMMONIA in the last 168 hours. Coagulation Profile: Recent Labs  Lab 07/13/20 0855  INR 0.9   Cardiac Enzymes: No results for input(s): CKTOTAL, CKMB, CKMBINDEX, TROPONINI in the last 168 hours. BNP (last 3 results) No results for input(s): PROBNP in the last 8760 hours. HbA1C: No results for input(s): HGBA1C in the last 72 hours. CBG: No results for input(s): GLUCAP in the last 168 hours. Lipid Profile: No results for input(s): CHOL, HDL, LDLCALC, TRIG, CHOLHDL, LDLDIRECT in the last 72 hours. Thyroid Function Tests: No results for input(s): TSH, T4TOTAL, FREET4, T3FREE, THYROIDAB in the last 72 hours. Anemia Panel: No results for input(s): VITAMINB12, FOLATE, FERRITIN, TIBC, IRON, RETICCTPCT in the last 72  hours.  --------------------------------------------------------------------------------------------------------------- Urine analysis:    Component Value Date/Time   COLORURINE STRAW (A) 07/13/2020 0855   APPEARANCEUR CLEAR 07/13/2020 0855   LABSPEC 1.011 07/13/2020 0855   PHURINE 5.0 07/13/2020 0855   GLUCOSEU NEGATIVE 07/13/2020 0855   HGBUR SMALL (A) 07/13/2020 0855   BILIRUBINUR NEGATIVE 07/13/2020 0855   KETONESUR NEGATIVE 07/13/2020 0855   PROTEINUR NEGATIVE 07/13/2020 0855   NITRITE NEGATIVE 07/13/2020 0855   LEUKOCYTESUR NEGATIVE 07/13/2020 0855      Imaging Results:    DG Chest Portable 1 View  Result Date: 07/13/2020 CLINICAL DATA:  Shortness of breath and chest pain. EXAM: PORTABLE CHEST 1 VIEW COMPARISON:  06/12/2020. FINDINGS: Normal heart size. No pleural effusion. Increased reticular interstitial opacities are identified with a lower lung zone predominance. Superimposed nodular densities are identified within both lower lobes, increased from previous exam. IMPRESSION: Increase lower lung zone reticular and nodular interstitial opacities are concerning for either aspiration or multifocal pneumonia. Followup PA and lateral chest X-ray is recommended in 3-4 weeks following trial of antibiotic therapy to ensure resolution and exclude underlying malignancy. Electronically Signed   By: Signa Kell M.D.   On: 07/13/2020 09:53    My personal review of EKG: Rhythm NSR   Assessment & Plan:    Active Problems:   Acute hypoxemic respiratory failure (HCC)   1. Acute hypoxemic respiratory failure-secondary to aspiration pneumonia, chest x-ray confirms multifocal pneumonia.  Patient is currently requiring 100% nonrebreather.  Patient started on antibiotics, vancomycin and cefepime.  Will obtain strep pneumo urinary antigen, blood cultures x2, Legionella antigen.  Obtain sputum culture. 2. Sepsis due to aspiration pneumonia-patient presented with fever, lactic acid 3.6,  tachypnea, tachycardia with chest x-ray showing multifocal pneumonia.  Has history of esophageal dysmotility with esophageal stricture.  Will obtain swallow evaluation, if no significant abnormality consider esophagram/barium swallow. 3. Hypothyroidism-continue Synthroid 4. Recent left ankle ORIF-stable, continue oxycodone as needed for pain 5. Hypertension-blood pressure was elevated at presentation, continue Cardizem.   DVT Prophylaxis-   Lovenox   AM Labs Ordered, also please review Full Orders  Family Communication: Admission, patients condition and plan of care including tests being ordered have been discussed with the patient  who indicate understanding and agree with the plan and Code Status.  Code Status:  Full code  Admission status: Inpatient :The appropriate admission status for this patient is INPATIENT. Inpatient status is judged to be reasonable and necessary in order to provide the required intensity of service to ensure the patient's safety. The patient's presenting symptoms, physical exam findings, and initial radiographic and laboratory data in the context of  their chronic comorbidities is felt to place them at high risk for further clinical deterioration. Furthermore, it is not anticipated that the patient will be medically stable for discharge from the hospital within 2 midnights of admission. The following factors support the admission status of inpatient.     The patient's presenting symptoms include shortness of breath The worrisome physical exam findings include acute hypoxemic respiratory failure. The initial radiographic and laboratory data are worrisome because of acute hypoxemic respiratory failure. The chronic co-morbidities include esophageal stricture and dysmotility.       * I certify that at the point of admission it is my clinical judgment that the patient will require inpatient hospital care spanning beyond 2 midnights from the point of admission due to  high intensity of service, high risk for further deterioration and high frequency of surveillance required.*  Time spent in minutes : 60 min   Genevia Bouldin S Sulo Janczak M.D

## 2020-07-13 NOTE — ED Notes (Signed)
CRITICAL VALUE ALERT  Critical Value:  Lactic 2.3  Date & Time Notied:  07/13/2020 1146  Provider Notified: Dr. Rhunette Croft  Orders Received/Actions taken: see chart

## 2020-07-14 ENCOUNTER — Other Ambulatory Visit: Payer: Self-pay

## 2020-07-14 DIAGNOSIS — R6520 Severe sepsis without septic shock: Secondary | ICD-10-CM | POA: Diagnosis not present

## 2020-07-14 DIAGNOSIS — J189 Pneumonia, unspecified organism: Secondary | ICD-10-CM

## 2020-07-14 DIAGNOSIS — A419 Sepsis, unspecified organism: Secondary | ICD-10-CM | POA: Diagnosis not present

## 2020-07-14 DIAGNOSIS — J9601 Acute respiratory failure with hypoxia: Secondary | ICD-10-CM | POA: Diagnosis not present

## 2020-07-14 LAB — COMPREHENSIVE METABOLIC PANEL
ALT: 10 U/L (ref 0–44)
AST: 10 U/L — ABNORMAL LOW (ref 15–41)
Albumin: 3.1 g/dL — ABNORMAL LOW (ref 3.5–5.0)
Alkaline Phosphatase: 54 U/L (ref 38–126)
Anion gap: 6 (ref 5–15)
BUN: 12 mg/dL (ref 6–20)
CO2: 30 mmol/L (ref 22–32)
Calcium: 8.3 mg/dL — ABNORMAL LOW (ref 8.9–10.3)
Chloride: 102 mmol/L (ref 98–111)
Creatinine, Ser: 0.67 mg/dL (ref 0.61–1.24)
GFR, Estimated: >60 mL/min (ref >60)
Glucose, Bld: 135 mg/dL — ABNORMAL HIGH (ref 70–99)
Potassium: 3.4 mmol/L — ABNORMAL LOW (ref 3.5–5.1)
Sodium: 138 mmol/L (ref 135–145)
Total Bilirubin: 0.3 mg/dL (ref 0.3–1.2)
Total Protein: 5.9 g/dL — ABNORMAL LOW (ref 6.5–8.1)

## 2020-07-14 LAB — CBC
HCT: 34.1 % — ABNORMAL LOW (ref 39.0–52.0)
Hemoglobin: 10.6 g/dL — ABNORMAL LOW (ref 13.0–17.0)
MCH: 29.4 pg (ref 26.0–34.0)
MCHC: 31.1 g/dL (ref 30.0–36.0)
MCV: 94.5 fL (ref 80.0–100.0)
Platelets: 174 10*3/uL (ref 150–400)
RBC: 3.61 MIL/uL — ABNORMAL LOW (ref 4.22–5.81)
RDW: 12.5 % (ref 11.5–15.5)
WBC: 8.4 10*3/uL (ref 4.0–10.5)
nRBC: 0 % (ref 0.0–0.2)

## 2020-07-14 MED ORDER — ALPRAZOLAM 1 MG PO TABS
1.0000 mg | ORAL_TABLET | Freq: Four times a day (QID) | ORAL | Status: DC | PRN
  Administered 2020-07-14 – 2020-07-15 (×3): 1 mg via ORAL
  Filled 2020-07-14: qty 2
  Filled 2020-07-14 (×3): qty 1

## 2020-07-14 MED ORDER — POTASSIUM CHLORIDE CRYS ER 20 MEQ PO TBCR
20.0000 meq | EXTENDED_RELEASE_TABLET | Freq: Once | ORAL | Status: AC
  Administered 2020-07-14: 20 meq via ORAL
  Filled 2020-07-14: qty 1

## 2020-07-14 NOTE — Progress Notes (Signed)
[facility]  PROGRESS NOTE  Christian Ellison VOZ:[id number] DOB: [birth year] DOA: 07/13/2020 PCP: Jason Coop, FNP   Brief HPI:   59 y.o. male, with history of esophageal stricture and dysmotility, aspiration pneumonia in the past, recent left talus and calcaneus fracture s/p ORIF, came to ED with complaints of shortness of breath.  Patient is on CPAP at night.  He is up-to-date with COVID-19 vaccine.  There is no previous history of DVT/PE.  Chest x-ray shows multifocal pneumonia.  Patient started on vancomycin, cefepime, doxycycline.    Subjective   Patient seen and examined, breathing much better.  No longer requiring oxygen.   Assessment/Plan:     1. Acute hypoxemic respiratory failure-secondary to aspiration pneumonia, chest x-ray confirmed multifocal pneumonia.  Patient initially requiring 100% nonrebreather oxygen.  He is weaned off oxygen.  Continue vancomycin, cefepime.  Blood cultures are negative to date.  Follow blood cultures x2. 2. Sepsis due to aspiration pneumonia-patient presented with fever, lactic acid 3.6, tachypnea, tachycardia with chest x-ray showing multifocal pneumonia.  Sepsis physiology has resolved. 3. Dysphagia-patient has history of esophageal dysmotility with esophageal stricture.  He is eating fine without any complication this time.  Swallow evaluation is pending. 4. Hypothyroidism-continue Synthroid 5. Recent left ankle ORIF-stable, continue oxycodone as needed. 6. Hypertension-we will hold Cardizem as patient blood pressure is soft.     COVID-19 Labs  No results for input(s): DDIMER, FERRITIN, LDH, CRP in the last 72 hours.  Lab Results  Component Value Date   SARSCOV2NAA NEGATIVE 07/13/2020   SARSCOV2NAA NEGATIVE 07/01/2020   SARSCOV2NAA NEGATIVE 04/21/2020   SARSCOV2NAA Not Detected 07/07/2019     Scheduled medications:   . aspirin EC  81 mg Oral QHS  . Chlorhexidine Gluconate Cloth  6 each Topical Q0600  . enoxaparin  (LOVENOX) injection  40 mg Subcutaneous Q24H  . levothyroxine  25 mcg Oral Daily  . pantoprazole  40 mg Oral BID         CBG: Recent Labs  Lab 07/13/20 1753  GLUCAP 102*    SpO2: 93 % O2 Flow Rate (L/min): 5 L/min    CBC: Recent Labs  Lab 07/13/20 0855 07/14/20 0455  WBC 8.6 8.4  NEUTROABS 6.7  --   HGB 14.3 10.6*  HCT 45.0 34.1*  MCV 93.4 94.5  PLT 212 174    Basic Metabolic Panel: Recent Labs  Lab 07/13/20 0855 07/14/20 0455  NA 139 138  K 3.7 3.4*  CL 98 102  CO2 28 30  GLUCOSE 137* 135*  BUN 15 12  CREATININE 0.88 0.67  CALCIUM 9.5 8.3*     Liver Function Tests: Recent Labs  Lab 07/13/20 0855 07/14/20 0455  AST 18 10*  ALT 14 10  ALKPHOS 91 54  BILITOT 0.4 0.3  PROT 8.4* 5.9*  ALBUMIN 4.5 3.1*     Antibiotics: Anti-infectives (From admission, onward)   Start     Dose/Rate Route Frequency Ordered Stop   07/13/20 1900  vancomycin (VANCOCIN) IVPB 1000 mg/200 mL premix        1,000 mg 200 mL/hr over 60 Minutes Intravenous Every 8 hours 07/13/20 1013     07/13/20 1800  ceFEPIme (MAXIPIME) 2 g in sodium chloride 0.9 % 100 mL IVPB        2 g 200 mL/hr over 30 Minutes Intravenous Every 8 hours 07/13/20 0928     07/13/20 1015  vancomycin (VANCOCIN) IVPB 1000 mg/200 mL premix  Status:  Discontinued  1,000 mg 200 mL/hr over 60 Minutes Intravenous  Once 07/13/20 1005 07/13/20 1008   07/13/20 1015  vancomycin (VANCOREADY) IVPB 2000 mg/400 mL        2,000 mg 200 mL/hr over 120 Minutes Intravenous  Once 07/13/20 1008 07/13/20 1340   07/13/20 1000  doxycycline (VIBRA-TABS) tablet 100 mg  Status:  Discontinued        100 mg Oral Every 12 hours 07/13/20 0946 07/13/20 1217   07/13/20 0915  ceFEPIme (MAXIPIME) 2 g in sodium chloride 0.9 % 100 mL IVPB        2 g 200 mL/hr over 30 Minutes Intravenous  Once 07/13/20 0904 07/13/20 1019       DVT prophylaxis: Lovenox  Code Status: Full code  Family Communication: No family at  bedside   Consultants:    Procedures:      Objective   Vitals:   07/14/20 0800 07/14/20 0900 07/14/20 1000 07/14/20 1153  BP: 111/63  129/78   Pulse: 69 (!) 104 79   Resp: (!) 25 (!) 21 (!) 25   Temp:    98.1 F (36.7 C)  TempSrc:    Oral  SpO2: 96% 95% 93%   Weight:      Height:        Intake/Output Summary (Last 24 hours) at 07/14/2020 1228 Last data filed at 07/14/2020 0900 Gross per 24 hour  Intake 4680.84 ml  Output 2375 ml  Net 2305.84 ml    11/26 1901 - 11/28 0700 In: 4780.8 [I.V.:1480.9] Out: 1900 [Urine:1900]  Filed Weights   07/13/20 0846  Weight: 89 kg    Physical Examination:   General-appears in no acute distress Heart-S1-S2, regular, no murmur auscultated Lungs-clear to auscultation bilaterally, no wheezing or crackles auscultated Abdomen-soft, nontender, no organomegaly Extremities-no edema in the lower extremities Neuro-alert, oriented x3, no focal deficit noted  Status is: Inpatient  Dispo: The patient is from:  Home              Anticipated d/c is to: Home              Anticipated d/c date is: 07/15/2020              Patient currently not medically stable for discharge  Barrier to discharge-treatment for aspiration pneumonia            Data Reviewed:   Recent Results (from the past 240 hour(s))  Culture, blood (Routine x 2)     Status: None (Preliminary result)   Collection Time: 07/13/20  8:55 AM   Specimen: BLOOD RIGHT HAND  Result Value Ref Range Status   Specimen Description   Final    BLOOD RIGHT HAND BOTTLES DRAWN AEROBIC AND ANAEROBIC   Special Requests   Final    Blood Culture results may not be optimal due to an inadequate volume of blood received in culture bottles   Culture   Final    NO GROWTH < 24 HOURS Performed at [facility], [address] [place], [place] [address]    Report Status PENDING  Incomplete  Resp Panel by RT-PCR (Flu A&B, Covid) Nasopharyngeal Swab     Status: None   Collection  Time: 07/13/20  9:27 AM   Specimen: Nasopharyngeal Swab; Nasopharyngeal(NP) swabs in vial transport medium  Result Value Ref Range Status   SARS Coronavirus 2 by RT PCR NEGATIVE NEGATIVE Final    Comment: (NOTE) SARS-CoV-2 target nucleic acids are NOT DETECTED.  The SARS-CoV-2 RNA is generally  detectable in upper respiratory specimens during the acute phase of infection. The lowest concentration of SARS-CoV-2 viral copies this assay can detect is 138 copies/mL. A negative result does not preclude SARS-Cov-2 infection and should not be used as the sole basis for treatment or other patient management decisions. A negative result may occur with  improper specimen collection/handling, submission of specimen other than nasopharyngeal swab, presence of viral mutation(s) within the areas targeted by this assay, and inadequate number of viral copies(<138 copies/mL). A negative result must be combined with clinical observations, patient history, and epidemiological information. The expected result is Negative.  Fact Sheet for Patients:  BloggerCourse.com  Fact Sheet for Healthcare Providers:  SeriousBroker.it  This test is no t yet approved or cleared by the [place] FDA and  has been authorized for detection and/or diagnosis of SARS-CoV-2 by FDA under an Emergency Use Authorization (EUA). This EUA will remain  in effect (meaning this test can be used) for the duration of the COVID-19 declaration under Section 564(b)(1) of the Act, 21 U.S.C.section 360bbb-3(b)(1), unless the authorization is terminated  or revoked sooner.       Influenza A by PCR NEGATIVE NEGATIVE Final   Influenza B by PCR NEGATIVE NEGATIVE Final    Comment: (NOTE) The Xpert Xpress SARS-CoV-2/FLU/RSV plus assay is intended as an aid in the diagnosis of influenza from Nasopharyngeal swab specimens and should not be used as a sole basis for treatment. Nasal washings  and aspirates are unacceptable for Xpert Xpress SARS-CoV-2/FLU/RSV testing.  Fact Sheet for Patients: BloggerCourse.com  Fact Sheet for Healthcare Providers: SeriousBroker.it  This test is not yet approved or cleared by the [place] FDA and has been authorized for detection and/or diagnosis of SARS-CoV-2 by FDA under an Emergency Use Authorization (EUA). This EUA will remain in effect (meaning this test can be used) for the duration of the COVID-19 declaration under Section 564(b)(1) of the Act, 21 U.S.C. section 360bbb-3(b)(1), unless the authorization is terminated or revoked.  Performed at [facility], [address] [place], [place] [address]   Culture, blood (Routine x 2)     Status: None (Preliminary result)   Collection Time: 07/13/20 10:20 AM   Specimen: Left Antecubital; Blood  Result Value Ref Range Status   Specimen Description   Final    LEFT ANTECUBITAL BOTTLES DRAWN AEROBIC AND ANAEROBIC   Special Requests Blood Culture adequate volume  Final   Culture   Final    NO GROWTH < 24 HOURS Performed at [facility], [address] [place], [place] [address]    Report Status PENDING  Incomplete    No results for input(s): LIPASE, AMYLASE in the last 168 hours. No results for input(s): AMMONIA in the last 168 hours.  Cardiac Enzymes: No results for input(s): CKTOTAL, CKMB, CKMBINDEX, TROPONINI in the last 168 hours. BNP (last 3 results) Recent Labs    10/29/19 1736 04/21/20 1322  BNP 29.0 23.0    ProBNP (last 3 results) No results for input(s): PROBNP in the last 8760 hours.  Studies:  DG Chest Portable 1 View  Result Date: 07/13/2020 CLINICAL DATA:  Shortness of breath and chest pain. EXAM: PORTABLE CHEST 1 VIEW COMPARISON:  06/12/2020. FINDINGS: Normal heart size. No pleural effusion. Increased reticular interstitial opacities are identified with a lower lung zone predominance. Superimposed nodular  densities are identified within both lower lobes, increased from previous exam. IMPRESSION: Increase lower lung zone reticular and nodular interstitial opacities are concerning for either aspiration or multifocal  pneumonia. Followup PA and lateral chest X-ray is recommended in 3-4 weeks following trial of antibiotic therapy to ensure resolution and exclude underlying malignancy. Electronically Signed   By: Signa Kell M.D.   On: 07/13/2020 09:53       Yanice Maqueda S Maripaz Mullan   [facility] If 7PM-7AM, please contact night-coverage at [email or web address], Office  [phone]   07/14/2020, 12:28 PM  LOS: 1 day

## 2020-07-15 DIAGNOSIS — J9601 Acute respiratory failure with hypoxia: Secondary | ICD-10-CM | POA: Diagnosis not present

## 2020-07-15 DIAGNOSIS — J189 Pneumonia, unspecified organism: Secondary | ICD-10-CM | POA: Diagnosis not present

## 2020-07-15 MED ORDER — POTASSIUM CHLORIDE CRYS ER 20 MEQ PO TBCR
20.0000 meq | EXTENDED_RELEASE_TABLET | Freq: Once | ORAL | Status: AC
  Administered 2020-07-15: 20 meq via ORAL
  Filled 2020-07-15: qty 1

## 2020-07-15 MED ORDER — AMOXICILLIN-POT CLAVULANATE 875-125 MG PO TABS: 1.0000 | ORAL_TABLET | Freq: Two times a day (BID) | ORAL | 0 refills | Status: AC

## 2020-07-15 NOTE — Progress Notes (Signed)
Christian Ellison, AC in to start PIV

## 2020-07-15 NOTE — Discharge Summary (Signed)
Physician Discharge Summary  Christian Ellison CHY:[id number] DOB: Dec 18, 1960 DOA: 07/13/2020  PCP: Jason Coop, FNP  Admit date: 07/13/2020 Discharge date: 07/15/2020  Time spent: 50 minutes  Recommendations for Outpatient Follow-up:  1. Follow-up PCP in 2 weeks   Discharge Diagnoses:  Active Problems:   Acute hypoxemic respiratory failure [facility])   Discharge Condition: Stable  Diet recommendation: Regular diet  Filed Weights   07/13/20 0846  Weight: 89 kg    History of present illness:  59 y.o.male,with history of esophageal stricture and dysmotility, aspiration pneumonia in the past, recent left talus and calcaneus fracture s/p ORIF,came to ED with complaints of shortness of breath. Patient is onCPAP at night. He is up-to-date with COVID-19 vaccine. There is no previous history of DVT/PE. Chest x-ray shows multifocal pneumonia.Patient started on vancomycin, cefepime, doxycycline.  Hospital Course:   1. Acute hypoxemic respiratory failure-secondary to aspiration pneumonia, chest x-ray confirmed multifocal pneumonia.  Patient initially requiring 100% nonrebreather oxygen.  He is weaned off oxygen.    Patient was started on vancomycin, cefepime.  Blood cultures x2 are negative to date.  2. Sepsis due to aspiration pneumonia-patient presented with fever, lactic acid 3.6, tachypnea, tachycardia with chest x-ray showing multifocal pneumonia.  Sepsis physiology has resolved.  Will discharge on Augmentin 1 tablet p.o. twice daily for 5 more days. 3. Dysphagia-patient has history of esophageal dysmotility with esophageal stricture.  He is eating fine without any complication this time.   4. Hypothyroidism-continue Synthroid 5. Recent left ankle ORIF-stable, continue oxycodone as needed. 6. Hypertension-we will  discontinue Cardizem as patient blood pressure is soft.   Procedures:    Consultations:    Discharge Exam: Vitals:   07/15/20 0215 07/15/20  0553  BP: 121/71 133/78  Pulse: 78 71  Resp: 20 20  Temp: 99 F (37.2 C) 98.4 F (36.9 C)  SpO2: 94% 96%    General: Appears in no acute distress Cardiovascular: S1-S2, regular, no murmur auscultated Respiratory: Clear to auscultation bilaterally  Discharge Instructions   Discharge Instructions    Diet - low sodium heart healthy   Complete by: As directed    Increase activity slowly   Complete by: As directed      Allergies as of 07/15/2020   No Known Allergies     Medication List    STOP taking these medications   diltiazem 120 MG 24 hr capsule Commonly known as: TIAZAC   ibuprofen 200 MG tablet Commonly known as: ADVIL   naproxen 500 MG tablet Commonly known as: NAPROSYN   oxyCODONE-acetaminophen 5-325 MG tablet Commonly known as: PERCOCET/ROXICET     TAKE these medications   acetaminophen 325 MG tablet Commonly known as: TYLENOL Take 2 tablets (650 mg total) by mouth every 6 (six) hours as needed for mild pain (or Fever >/= 101).   ALPRAZolam 1 MG tablet Commonly known as: XANAX Take 1 mg by mouth every 6 (six) hours as needed for anxiety.   amoxicillin-clavulanate 875-125 MG tablet Commonly known as: Augmentin Take 1 tablet by mouth 2 (two) times daily for 5 days.   aspirin EC 81 MG tablet Take 1 tablet (81 mg total) by mouth daily with breakfast. What changed: when to take this   Glucosamine HCl 1000 MG Tabs Take 1,000 mg by mouth at bedtime.   levothyroxine 25 MCG tablet Commonly known as: SYNTHROID Take 25 mcg by mouth daily.   multivitamin with minerals Tabs tablet Take 1 tablet by mouth daily. 50+   oxyCODONE  5 MG immediate release tablet Commonly known as: Oxy IR/ROXICODONE Take 5 mg by mouth every 4 (four) hours as needed for severe pain.   pantoprazole 40 MG tablet Commonly known as: Protonix Take 1 tablet (40 mg total) by mouth 2 (two) times daily.      No Known Allergies    The results of significant diagnostics from  this hospitalization (including imaging, microbiology, ancillary and laboratory) are listed below for reference.    Significant Diagnostic Studies: DG Lumbar Spine Complete  Result Date: 06/20/2020 CLINICAL DATA:  Status post fall off a 6 foot ladder. Having low back pain. EXAM: LUMBAR SPINE - COMPLETE 4+ VIEW COMPARISON:  CT abdomen pelvis 06/10/2019 FINDINGS: Interval development of in anterior wedge compression fracture of the L1 vertebral body. Suggestion of slight anterior height loss of the T12 vertebral body that appears stable compared to CT 2020. Cortical irregularity along the anterior superior corner of the L4 vertebral body of unclear etiology. Alignment is normal. Multilevel mild-to-moderate degenerative changes of the spine. IMPRESSION: 1. Age-indeterminate new anterior wedge compression fracture of the L1 vertebral body. Correlate with point tenderness to palpation to evaluate for acute injury. If clinically indicated, please consider cross-sectional spine imaging for further evaluation of acute traumatic injury. 2. Cortical irregularity along the anterior superior corner of the L4 vertebral body of unclear etiology. If clinically indicated, please consider cross-sectional spine imaging for further evaluation of acute traumatic injury. Electronically Signed   By: Tish Frederickson M.D.   On: 06/20/2020 21:38   DG Ankle 2 Views Left  Result Date: 06/20/2020 CLINICAL DATA:  Fracture dislocation, postreduction. EXAM: LEFT ANKLE - 2 VIEW COMPARISON:  Pre reduction radiograph. FINDINGS: Improved alignment of the talus fracture and subtalar dislocation. Subtalar joints are not congruent. Ankle mortise is preserved. No new fracture. Overlying splint material in place limits osseous and soft tissue fine detail. IMPRESSION: Improved alignment of the talus fracture and subtalar dislocation postreduction. Electronically Signed   By: Narda Rutherford M.D.   On: 06/20/2020 21:21   DG Ankle Complete  Left  Result Date: 06/20/2020 CLINICAL DATA:  Falling from ladder rolling ankle EXAM: LEFT ANKLE COMPLETE - 3+ VIEW COMPARISON:  None. FINDINGS: There is a a comminuted displaced fracture seen through the posterior and medial body of the talus. There is anterior lateral dislocation of the talus with significant widening at the subtalar joint. There is probable nondisplaced fracture seen through the mid body of the talus. The ankle mortise however appears to be intact. There is diffuse soft tissue swelling. Ankle joint effusion is noted. IMPRESSION: Comminuted fracture dislocation of the posterior talus and midbody. Electronically Signed   By: Jonna Clark M.D.   On: 06/20/2020 18:44   CT Ankle Left Wo Contrast  Result Date: 06/20/2020 CLINICAL DATA:  Ankle fracture fall from ladder EXAM: CT OF THE LEFT ANKLE WITHOUT CONTRAST TECHNIQUE: Multidetector CT imaging of the left ankle was performed according to the standard protocol. Multiplanar CT image reconstructions were also generated. COMPARISON:  Radiograph same day FINDINGS: Bones/Joint/Cartilage There is been interval reduction of the talar dislocation. There is comminuted fracture seen throughout the anteromedial talar process with extension to the medial talonavicular joint. There is also comminuted fracture seen through the posterior medial talar body with small fracture fragments displaced posteriorly. There are tiny fracture fragment seen adjacent to the medial malleolus. A rounded fracture fragment seen within the sinus tarsi measuring 7 mm. Comminuted mildly displaced lateral anterior calcaneal process fracture is noted. Ligaments Suboptimally  assessed by CT. Muscles and Tendons There is extensive edema possible small intramuscular hematoma seen at the myotendinous junction the extensor digitorum musculature. The flexor and extensor tendons however appear to be intact. The Achilles tendon is intact. Soft tissues Extensive soft tissue swelling the  ankle with soft tissue hematoma. IMPRESSION: Interval reduction fracture dislocation. Extensively comminuted fractures through the talar body, and anterolateral calcaneus as described above. 7 mm fracture fragment within the sinus tarsi Extensive soft tissue swelling around the ankle with probable intramuscular/soft tissue hematoma within the flexor digitorum myotendinous junction. Electronically Signed   By: Jonna Clark M.D.   On: 06/20/2020 22:14   DG Chest Portable 1 View  Result Date: 07/13/2020 CLINICAL DATA:  Shortness of breath and chest pain. EXAM: PORTABLE CHEST 1 VIEW COMPARISON:  06/12/2020. FINDINGS: Normal heart size. No pleural effusion. Increased reticular interstitial opacities are identified with a lower lung zone predominance. Superimposed nodular densities are identified within both lower lobes, increased from previous exam. IMPRESSION: Increase lower lung zone reticular and nodular interstitial opacities are concerning for either aspiration or multifocal pneumonia. Followup PA and lateral chest X-ray is recommended in 3-4 weeks following trial of antibiotic therapy to ensure resolution and exclude underlying malignancy. Electronically Signed   By: Signa Kell M.D.   On: 07/13/2020 09:53   DG Foot Complete Left  Result Date: 07/04/2020 CLINICAL DATA:  Internal fixation EXAM: LEFT FOOT - COMPLETE 3+ VIEW; DG C-ARM 1-60 MIN COMPARISON:  CT 06/20/2020 FINDINGS: Two intraoperative spot images demonstrate placement of screw within the distal left talus. Displaced fracture through the posterior talar process again noted, unchanged. IMPRESSION: Internal fixation changes as above. No visible complicating feature. Electronically Signed   By: Charlett Nose M.D.   On: 07/04/2020 17:27   DG C-Arm 1-60 Min  Result Date: 07/04/2020 CLINICAL DATA:  Internal fixation EXAM: LEFT FOOT - COMPLETE 3+ VIEW; DG C-ARM 1-60 MIN COMPARISON:  CT 06/20/2020 FINDINGS: Two intraoperative spot images  demonstrate placement of screw within the distal left talus. Displaced fracture through the posterior talar process again noted, unchanged. IMPRESSION: Internal fixation changes as above. No visible complicating feature. Electronically Signed   By: Charlett Nose M.D.   On: 07/04/2020 17:27    Microbiology: Recent Results (from the past 240 hour(s))  Culture, blood (Routine x 2)     Status: None (Preliminary result)   Collection Time: 07/13/20  8:55 AM   Specimen: BLOOD RIGHT HAND  Result Value Ref Range Status   Specimen Description   Final    BLOOD RIGHT HAND BOTTLES DRAWN AEROBIC AND ANAEROBIC   Special Requests   Final    Blood Culture results may not be optimal due to an inadequate volume of blood received in culture bottles   Culture   Final    NO GROWTH 2 DAYS Performed at [facility], [address] [place], [place] [address]    Report Status PENDING  Incomplete  Resp Panel by RT-PCR (Flu A&B, Covid) Nasopharyngeal Swab     Status: None   Collection Time: 07/13/20  9:27 AM   Specimen: Nasopharyngeal Swab; Nasopharyngeal(NP) swabs in vial transport medium  Result Value Ref Range Status   SARS Coronavirus 2 by RT PCR NEGATIVE NEGATIVE Final    Comment: (NOTE) SARS-CoV-2 target nucleic acids are NOT DETECTED.  The SARS-CoV-2 RNA is generally detectable in upper respiratory specimens during the acute phase of infection. The lowest concentration of SARS-CoV-2 viral copies this assay can detect is 138 copies/mL. A  negative result does not preclude SARS-Cov-2 infection and should not be used as the sole basis for treatment or other patient management decisions. A negative result may occur with  improper specimen collection/handling, submission of specimen other than nasopharyngeal swab, presence of viral mutation(s) within the areas targeted by this assay, and inadequate number of viral copies(<138 copies/mL). A negative result must be combined with clinical observations, patient  history, and epidemiological information. The expected result is Negative.  Fact Sheet for Patients:  BloggerCourse.com  Fact Sheet for Healthcare Providers:  SeriousBroker.it  This test is no t yet approved or cleared by the [place] FDA and  has been authorized for detection and/or diagnosis of SARS-CoV-2 by FDA under an Emergency Use Authorization (EUA). This EUA will remain  in effect (meaning this test can be used) for the duration of the COVID-19 declaration under Section 564(b)(1) of the Act, 21 U.S.C.section 360bbb-3(b)(1), unless the authorization is terminated  or revoked sooner.       Influenza A by PCR NEGATIVE NEGATIVE Final   Influenza B by PCR NEGATIVE NEGATIVE Final    Comment: (NOTE) The Xpert Xpress SARS-CoV-2/FLU/RSV plus assay is intended as an aid in the diagnosis of influenza from Nasopharyngeal swab specimens and should not be used as a sole basis for treatment. Nasal washings and aspirates are unacceptable for Xpert Xpress SARS-CoV-2/FLU/RSV testing.  Fact Sheet for Patients: BloggerCourse.com  Fact Sheet for Healthcare Providers: SeriousBroker.it  This test is not yet approved or cleared by the [place] FDA and has been authorized for detection and/or diagnosis of SARS-CoV-2 by FDA under an Emergency Use Authorization (EUA). This EUA will remain in effect (meaning this test can be used) for the duration of the COVID-19 declaration under Section 564(b)(1) of the Act, 21 U.S.C. section 360bbb-3(b)(1), unless the authorization is terminated or revoked.  Performed at [facility], [address] [place], [place] [address]   Culture, blood (Routine x 2)     Status: None (Preliminary result)   Collection Time: 07/13/20 10:20 AM   Specimen: Left Antecubital; Blood  Result Value Ref Range Status   Specimen Description   Final    LEFT ANTECUBITAL  BOTTLES DRAWN AEROBIC AND ANAEROBIC   Special Requests Blood Culture adequate volume  Final   Culture   Final    NO GROWTH 2 DAYS Performed at [facility], [address], [place], [place] [address]    Report Status PENDING  Incomplete     Labs: Basic Metabolic Panel: Recent Labs  Lab 07/13/20 0855 07/14/20 0455  NA 139 138  K 3.7 3.4*  CL 98 102  CO2 28 30  GLUCOSE 137* 135*  BUN 15 12  CREATININE 0.88 0.67  CALCIUM 9.5 8.3*   Liver Function Tests: Recent Labs  Lab 07/13/20 0855 07/14/20 0455  AST 18 10*  ALT 14 10  ALKPHOS 91 54  BILITOT 0.4 0.3  PROT 8.4* 5.9*  ALBUMIN 4.5 3.1*   No results for input(s): LIPASE, AMYLASE in the last 168 hours. No results for input(s): AMMONIA in the last 168 hours. CBC: Recent Labs  Lab 07/13/20 0855 07/14/20 0455  WBC 8.6 8.4  NEUTROABS 6.7  --   HGB 14.3 10.6*  HCT 45.0 34.1*  MCV 93.4 94.5  PLT 212 174    BNP (last 3 results) Recent Labs    10/29/19 1736 04/21/20 1322  BNP 29.0 23.0    CBG: Recent Labs  Lab 07/13/20 1753  GLUCAP 102*  Signed:  Meredeth IdeGagan S Zariah Jost MD.  [facility] 07/15/2020, 2:01 PM

## 2020-07-18 LAB — CULTURE, BLOOD (ROUTINE X 2)
Culture: NO GROWTH
Culture: NO GROWTH
Special Requests: ADEQUATE

## 2020-07-22 NOTE — Op Note (Signed)
Christian Ellison male 59 y.o. 07/04/2020   PreOperative Diagnosis: Left comminuted talus fracture Left comminuted calcaneus fracture Subtalar joint intra-articular loose body Talonavicular joint intra-articular loose body  PostOperative Diagnosis: Same  PROCEDURE: Open treatment of left talus fracture with internal fixation Open treatment of left calcaneus fracture Subtalar joint arthrotomy with removal of loose bodies Talonavicular joint arthrotomy with fluid removal of loose bodies  SURGEON: Dub Mikes, MD  ASSISTANT: None  ANESTHESIA: General LMA anesthesia with peripheral nerve blockade  FINDINGS: See below  IMPLANTS: Arthrex headless partially-threaded cannulated screw  INDICATIONS:59 y.o. male fell from a ladder and sustained the above injuries.  He was seen in the emergency department where closed reduction was performed of his subtalar fracture dislocation.  He was sent to me for evaluation.  Given the amount of comminution about his fractures and displacement of his talar head fracture he was indicated for surgery.  CT scan demonstrated large bony fragments within the subtalar joint as well as fracturing of the talar head and anterior process of the calcaneus.  There is also intra-articular loose bodies within the talonavicular joint.   Patient has significant amount of fracture blistering about the lateral aspect of his foot near the level of the sinus Tarsi and lateral foot and ankle.  Patient understood the risks, benefits and alternatives to surgery which include but are not limited to wound healing complications, infection, nonunion, malunion, need for further surgery as well as damage to surrounding structures. They also understood the potential for continued pain in that there were no guarantees of acceptable outcome After weighing these risks the patient opted to proceed with surgery.  PROCEDURE: Patient was identified in the preoperative holding area.   The left leg  was marked by myself.  Consent was signed by myself and the patient.  Block was performed by anesthesia in the preoperative holding area.  Patient was taken to the operative suite and placed supine on the operative table.  General LMA anesthesia was induced without difficulty. Bump was placed under the operative hip and bone foam was used.  All bony prominences were well padded.  Tourniquet was placed on the operative thigh.  Preoperative antibiotics were given. The extremity was prepped and draped in the usual sterile fashion and surgical timeout was performed.  The limb was elevated and the tourniquet was inflated to 250 mmHg.  Subtalar joint arthrotomy with removal of loose body: We began by making a small longitudinal incision overlying the sinus Tarsi.  This was taken sharply down through skin and subcutaneous tissue.  Then blunt dissection was used to perform arthrotomy within the subtalar joint.  Through direct palpation we able to identify the large bony fragment within the subtalar joint.  X-ray fluoroscopy was helpful in guiding removal of the large intra-articular bony fragment that was found within the subtalar joint.  This was grabbed with a hemostat.  Then the bony fragment was removed from the subtalar joint.  The joint was then irrigated copiously with normal saline to remove other small fragments of bone and cartilage.  Open treatment of calcaneus fracture: A separate deep incision was then created to gain access to the anterior process calcaneus fracture.  Blunt dissection was then used to identify the area of the fracture and then the anterior process of calcaneus fracture was identified.  Fracture hematoma in this area was removed.  The fracture fragments were mobilized.  They are reduced under direct visualization.  The fracture fragment some cells of the anterior process  calcaneus fracture were too small for internal fixation but reduction was adequate within the  calcaneocuboid joint and therefore no internal fixation was used.  Talonavicular joint arthrotomy with removal of loose bodies: We then turned our attention to the dorsal midfoot.  An incision was made overlying the talonavicular joint and talar head.  This was taken sharply down through skin and subcutaneous tissue.  The incision was between the extensor houses longus and tibialis anterior tendon.  The tendon sheath over the tibialis anterior was incised in line with the incision and mobilized.  Then the joint capsule overlying the talonavicular joint was identified.  The joint capsule was incised in line with the talonavicular joint.  Using a hemostat and freer elevator the bony fragmentation within the talonavicular joint was removed.  There was large cartilage pieces and bony fragmentation.  There was irrigation performed about this joint.     Open treatment of talus fracture: Then a separate deep incision was created proximally about the talar neck.  Blunt dissection was used to mobilize soft tissue overlying the talar neck.  The talar neck periosteal tissue was elevated and the fracture fragment with about the talar neck and head was identified.  There was complete disruption of this area with displacement and flipping of the fracture fragment approximately 180 degrees.  The fracture fragment was mobilized fully using a Therapist, nutritional and small bony fragmentations were removed with a rondure.  The fracture fragment was then fully removed and was found to be devoid of soft tissue.  Given the intra-articular nature of this fracture fragment and displacement it was reduced under direct visualization and held provisionally with K wire fixation.  Then a single headless compression screw was placed across the fracture site within the talar head and neck region.  This provided good stability of the fracture.  Maintenance of reduction was performed.  Direct visualization and fluoroscopy confirmed appropriate  reduction of the fracture.  Screw length was appropriate.  Screw position was appropriate.  Then the wounds were irrigated copiously with normal saline.  The tourniquet was released and hemostasis was obtained.  The deep tissue was closed with 3-0 Monocryl suture.  The skin and subcutaneous tissue was closed in a layered fashion using 3-0 Monocryl and 3-0 nylon.  Patient was placed in a soft dressing including Xeroform on the fracture blisters and incisions and soft dressing and nonweightbearing short leg splint.  Patient was awakened from anesthesia and taken to recovery in stable condition.  He tolerated the procedure well.  There were no complications.    POST OPERATIVE INSTRUCTIONS: Nonweightbearing to operative extremity Keep splint dry and intact Elevate limb Call the office with concerns Take 1 325 mg aspirin daily for DVT prophylaxis Follow-up in 2 weeks for splint removal, suture removal if appropriate and x-rays of the operative foot on arrival, nonweightbearing  TOURNIQUET TIME: Less than 1 hour  BLOOD LOSS:  less than 50 mL         DRAINS: none         SPECIMEN: none       COMPLICATIONS:  * No complications entered in OR log *         Disposition: PACU - hemodynamically stable.         Condition: stable

## 2020-08-05 ENCOUNTER — Emergency Department (HOSPITAL_COMMUNITY): Payer: 59

## 2020-08-05 ENCOUNTER — Other Ambulatory Visit: Payer: Self-pay

## 2020-08-05 ENCOUNTER — Encounter (HOSPITAL_COMMUNITY): Payer: Self-pay | Admitting: *Deleted

## 2020-08-05 ENCOUNTER — Inpatient Hospital Stay (HOSPITAL_COMMUNITY)
Admission: EM | Admit: 2020-08-05 | Discharge: 2020-08-07 | DRG: 871 | Disposition: A | Discharge status: Home / Self Care | Payer: 59 | Attending: Family Medicine | Admitting: Family Medicine

## 2020-08-05 DIAGNOSIS — R6520 Severe sepsis without septic shock: Secondary | ICD-10-CM | POA: Diagnosis present

## 2020-08-05 DIAGNOSIS — G4730 Sleep apnea, unspecified: Secondary | ICD-10-CM | POA: Diagnosis present

## 2020-08-05 DIAGNOSIS — A419 Sepsis, unspecified organism: Secondary | ICD-10-CM | POA: Diagnosis present

## 2020-08-05 DIAGNOSIS — Z79899 Other long term (current) drug therapy: Secondary | ICD-10-CM

## 2020-08-05 DIAGNOSIS — G8929 Other chronic pain: Secondary | ICD-10-CM | POA: Diagnosis present

## 2020-08-05 DIAGNOSIS — Z7982 Long term (current) use of aspirin: Secondary | ICD-10-CM

## 2020-08-05 DIAGNOSIS — Z8052 Family history of malignant neoplasm of bladder: Secondary | ICD-10-CM

## 2020-08-05 DIAGNOSIS — E872 Acidosis: Secondary | ICD-10-CM | POA: Diagnosis present

## 2020-08-05 DIAGNOSIS — K222 Esophageal obstruction: Secondary | ICD-10-CM | POA: Diagnosis present

## 2020-08-05 DIAGNOSIS — R933 Abnormal findings on diagnostic imaging of other parts of digestive tract: Secondary | ICD-10-CM | POA: Diagnosis not present

## 2020-08-05 DIAGNOSIS — Z8249 Family history of ischemic heart disease and other diseases of the circulatory system: Secondary | ICD-10-CM | POA: Diagnosis not present

## 2020-08-05 DIAGNOSIS — J9601 Acute respiratory failure with hypoxia: Secondary | ICD-10-CM | POA: Diagnosis present

## 2020-08-05 DIAGNOSIS — K219 Gastro-esophageal reflux disease without esophagitis: Secondary | ICD-10-CM | POA: Diagnosis present

## 2020-08-05 DIAGNOSIS — F418 Other specified anxiety disorders: Secondary | ICD-10-CM | POA: Diagnosis present

## 2020-08-05 DIAGNOSIS — Z803 Family history of malignant neoplasm of breast: Secondary | ICD-10-CM | POA: Diagnosis not present

## 2020-08-05 DIAGNOSIS — I509 Heart failure, unspecified: Secondary | ICD-10-CM

## 2020-08-05 DIAGNOSIS — R059 Cough, unspecified: Secondary | ICD-10-CM | POA: Diagnosis present

## 2020-08-05 DIAGNOSIS — D6489 Other specified anemias: Secondary | ICD-10-CM | POA: Diagnosis present

## 2020-08-05 DIAGNOSIS — K224 Dyskinesia of esophagus: Secondary | ICD-10-CM | POA: Diagnosis present

## 2020-08-05 DIAGNOSIS — Z800 Family history of malignant neoplasm of digestive organs: Secondary | ICD-10-CM

## 2020-08-05 DIAGNOSIS — I5042 Chronic combined systolic (congestive) and diastolic (congestive) heart failure: Secondary | ICD-10-CM | POA: Diagnosis present

## 2020-08-05 DIAGNOSIS — B1920 Unspecified viral hepatitis C without hepatic coma: Secondary | ICD-10-CM | POA: Diagnosis present

## 2020-08-05 DIAGNOSIS — Z8042 Family history of malignant neoplasm of prostate: Secondary | ICD-10-CM

## 2020-08-05 DIAGNOSIS — Z8619 Personal history of other infectious and parasitic diseases: Secondary | ICD-10-CM | POA: Diagnosis present

## 2020-08-05 DIAGNOSIS — I10 Essential (primary) hypertension: Secondary | ICD-10-CM | POA: Diagnosis present

## 2020-08-05 DIAGNOSIS — K2289 Other specified disease of esophagus: Secondary | ICD-10-CM

## 2020-08-05 DIAGNOSIS — E039 Hypothyroidism, unspecified: Secondary | ICD-10-CM | POA: Diagnosis present

## 2020-08-05 DIAGNOSIS — I110 Hypertensive heart disease with heart failure: Secondary | ICD-10-CM | POA: Diagnosis present

## 2020-08-05 DIAGNOSIS — J690 Pneumonitis due to inhalation of food and vomit: Secondary | ICD-10-CM | POA: Diagnosis present

## 2020-08-05 DIAGNOSIS — Z20822 Contact with and (suspected) exposure to covid-19: Secondary | ICD-10-CM | POA: Diagnosis present

## 2020-08-05 DIAGNOSIS — Z79890 Hormone replacement therapy (postmenopausal): Secondary | ICD-10-CM | POA: Diagnosis not present

## 2020-08-05 DIAGNOSIS — Z79891 Long term (current) use of opiate analgesic: Secondary | ICD-10-CM

## 2020-08-05 LAB — URINALYSIS, ROUTINE W REFLEX MICROSCOPIC
Bacteria, UA: NONE SEEN
Bilirubin Urine: NEGATIVE
Glucose, UA: NEGATIVE mg/dL
Ketones, ur: NEGATIVE mg/dL
Leukocytes,Ua: NEGATIVE
Nitrite: NEGATIVE
Protein, ur: NEGATIVE mg/dL
Specific Gravity, Urine: 1.015 (ref 1.005–1.030)
pH: 6 (ref 5.0–8.0)

## 2020-08-05 LAB — RAPID URINE DRUG SCREEN, HOSP PERFORMED
Amphetamines: NOT DETECTED
Barbiturates: NOT DETECTED
Benzodiazepines: POSITIVE — AB
Cocaine: NOT DETECTED
Opiates: NOT DETECTED
Tetrahydrocannabinol: NOT DETECTED

## 2020-08-05 LAB — CBC WITH DIFFERENTIAL/PLATELET
Abs Immature Granulocytes: 0.03 10*3/uL (ref 0.00–0.07)
Basophils Absolute: 0 10*3/uL (ref 0.0–0.1)
Basophils Relative: 0 %
Eosinophils Absolute: 0.1 10*3/uL (ref 0.0–0.5)
Eosinophils Relative: 2 %
HCT: 41.6 % (ref 39.0–52.0)
Hemoglobin: 13.4 g/dL (ref 13.0–17.0)
Immature Granulocytes: 0 %
Lymphocytes Relative: 7 %
Lymphs Abs: 0.5 10*3/uL — ABNORMAL LOW (ref 0.7–4.0)
MCH: 29.1 pg (ref 26.0–34.0)
MCHC: 32.2 g/dL (ref 30.0–36.0)
MCV: 90.4 fL (ref 80.0–100.0)
Monocytes Absolute: 0.4 10*3/uL (ref 0.1–1.0)
Monocytes Relative: 5 %
Neutro Abs: 6.9 10*3/uL (ref 1.7–7.7)
Neutrophils Relative %: 86 %
Platelets: 198 10*3/uL (ref 150–400)
RBC: 4.6 MIL/uL (ref 4.22–5.81)
RDW: 12.4 % (ref 11.5–15.5)
WBC: 8 10*3/uL (ref 4.0–10.5)
nRBC: 0 % (ref 0.0–0.2)

## 2020-08-05 LAB — COMPREHENSIVE METABOLIC PANEL
ALT: 14 U/L (ref 0–44)
AST: 19 U/L (ref 15–41)
Albumin: 4.3 g/dL (ref 3.5–5.0)
Alkaline Phosphatase: 69 U/L (ref 38–126)
Anion gap: 10 (ref 5–15)
BUN: 12 mg/dL (ref 6–20)
CO2: 28 mmol/L (ref 22–32)
Calcium: 8.8 mg/dL — ABNORMAL LOW (ref 8.9–10.3)
Chloride: 100 mmol/L (ref 98–111)
Creatinine, Ser: 0.83 mg/dL (ref 0.61–1.24)
GFR, Estimated: >60 mL/min (ref >60)
Glucose, Bld: 120 mg/dL — ABNORMAL HIGH (ref 70–99)
Potassium: 3.8 mmol/L (ref 3.5–5.1)
Sodium: 138 mmol/L (ref 135–145)
Total Bilirubin: 0.5 mg/dL (ref 0.3–1.2)
Total Protein: 7.9 g/dL (ref 6.5–8.1)

## 2020-08-05 LAB — RESP PANEL BY RT-PCR (FLU A&B, COVID) ARPGX2
Influenza A by PCR: NEGATIVE
Influenza B by PCR: NEGATIVE
SARS Coronavirus 2 by RT PCR: NEGATIVE

## 2020-08-05 LAB — PROTIME-INR
INR: 1 (ref 0.8–1.2)
Prothrombin Time: 12.9 seconds (ref 11.4–15.2)

## 2020-08-05 LAB — APTT: aPTT: 27 seconds (ref 24–36)

## 2020-08-05 LAB — LACTIC ACID, PLASMA
Lactic Acid, Venous: 2.9 mmol/L (ref 0.5–1.9)
Lactic Acid, Venous: 1.5 mmol/L (ref 0.5–1.9)

## 2020-08-05 MED ORDER — PIPERACILLIN-TAZOBACTAM 3.375 G IVPB
3.3750 g | Freq: Three times a day (TID) | INTRAVENOUS | Status: DC
  Administered 2020-08-05 – 2020-08-07 (×7): 3.375 g via INTRAVENOUS
  Filled 2020-08-05 (×6): qty 50

## 2020-08-05 MED ORDER — LACTATED RINGERS IV BOLUS
500.0000 mL | Freq: Once | INTRAVENOUS | Status: AC
  Administered 2020-08-05: 15:00:00 500 mL via INTRAVENOUS

## 2020-08-05 MED ORDER — DEXTROSE-NACL 5-0.9 % IV SOLN: INTRAVENOUS | Status: AC

## 2020-08-05 MED ORDER — LACTATED RINGERS IV BOLUS: 1000.0000 mL | Freq: Once | INTRAVENOUS | Status: DC

## 2020-08-05 MED ORDER — ONDANSETRON HCL 4 MG/2ML IJ SOLN: 4.0000 mg | Freq: Four times a day (QID) | INTRAMUSCULAR | Status: DC | PRN

## 2020-08-05 MED ORDER — ACETAMINOPHEN 500 MG PO TABS
1000.0000 mg | ORAL_TABLET | Freq: Once | ORAL | Status: AC
  Administered 2020-08-05: 14:00:00 1000 mg via ORAL
  Filled 2020-08-05: qty 2

## 2020-08-05 MED ORDER — LACTATED RINGERS IV SOLN: INTRAVENOUS | Status: DC

## 2020-08-05 MED ORDER — IOHEXOL 350 MG/ML SOLN
100.0000 mL | Freq: Once | INTRAVENOUS | Status: AC | PRN
  Administered 2020-08-05: 16:00:00 100 mL via INTRAVENOUS

## 2020-08-05 MED ORDER — ALPRAZOLAM 1 MG PO TABS
1.0000 mg | ORAL_TABLET | Freq: Once | ORAL | Status: AC
  Administered 2020-08-05: 1 mg via ORAL
  Filled 2020-08-05: qty 1

## 2020-08-05 MED ORDER — ONDANSETRON HCL 4 MG PO TABS: 4.0000 mg | ORAL_TABLET | Freq: Four times a day (QID) | ORAL | Status: DC | PRN

## 2020-08-05 MED ORDER — VANCOMYCIN HCL 2000 MG/400ML IV SOLN
2000.0000 mg | Freq: Once | INTRAVENOUS | Status: AC
  Administered 2020-08-05: 15:00:00 2000 mg via INTRAVENOUS
  Filled 2020-08-05: qty 400

## 2020-08-05 MED ORDER — PANTOPRAZOLE SODIUM 40 MG IV SOLR
40.0000 mg | INTRAVENOUS | Status: DC
  Administered 2020-08-05 – 2020-08-06 (×2): 40 mg via INTRAVENOUS
  Filled 2020-08-05 (×2): qty 40

## 2020-08-05 MED ORDER — HEPARIN SODIUM (PORCINE) 5000 UNIT/ML IJ SOLN
5000.0000 [IU] | Freq: Three times a day (TID) | INTRAMUSCULAR | Status: DC
  Administered 2020-08-05 – 2020-08-06 (×4): 5000 [IU] via SUBCUTANEOUS
  Filled 2020-08-05 (×4): qty 1

## 2020-08-05 MED ORDER — SODIUM CHLORIDE 0.9 % IV SOLN
500.0000 mg | Freq: Once | INTRAVENOUS | Status: AC
  Administered 2020-08-05: 18:00:00 500 mg via INTRAVENOUS
  Filled 2020-08-05: qty 500

## 2020-08-05 MED ORDER — SODIUM CHLORIDE 0.9 % IV SOLN
2.0000 g | Freq: Once | INTRAVENOUS | Status: AC
  Administered 2020-08-05: 14:00:00 2 g via INTRAVENOUS
  Filled 2020-08-05: qty 2

## 2020-08-05 MED ORDER — VANCOMYCIN HCL 1250 MG/250ML IV SOLN: 1250.0000 mg | Freq: Two times a day (BID) | INTRAVENOUS | Status: DC

## 2020-08-05 MED ORDER — ACETAMINOPHEN 650 MG RE SUPP: 650.0000 mg | Freq: Four times a day (QID) | RECTAL | Status: DC | PRN

## 2020-08-05 MED ORDER — ACETAMINOPHEN 325 MG PO TABS: 650.0000 mg | ORAL_TABLET | Freq: Four times a day (QID) | ORAL | Status: DC | PRN

## 2020-08-05 MED ORDER — SODIUM CHLORIDE 0.9 % IV SOLN: 2.0000 g | Freq: Three times a day (TID) | INTRAVENOUS | Status: DC

## 2020-08-05 MED ORDER — VANCOMYCIN HCL IN DEXTROSE 1-5 GM/200ML-% IV SOLN
1000.0000 mg | Freq: Once | INTRAVENOUS | Status: DC
Start: 2020-08-05 — End: 2020-08-05

## 2020-08-05 MED ORDER — LACTATED RINGERS IV BOLUS
1000.0000 mL | Freq: Once | INTRAVENOUS | Status: AC
  Administered 2020-08-05: 18:00:00 1000 mL via INTRAVENOUS

## 2020-08-05 NOTE — ED Notes (Signed)
Date and time results received: 08/05/20  (use smartphrase ".now" to insert current time)  Test: Lactic acid Critical Value: 2.9  Name of Provider Notified: Josh Long  Orders Received? Or Actions Taken?:

## 2020-08-05 NOTE — Progress Notes (Signed)
Pharmacy Antibiotic Note  Christian Ellison is a 59 y.o. male admitted on 08/05/2020 with pneumonia.  Pharmacy has been consulted for Vanco and Cefepime dosing.  Plan: Vancomycin 2000 mg IV x 1 dose. Vancomycin 1250 mg IV every 12 hours. Expected AUC 497 Cefepime 2000 mg IV every 8 hours. Monitor labs, c/s, and vanco level as indicated.     Temp (24hrs), Avg:102.6 F (39.2 C), Min:102.6 F (39.2 C), Max:102.6 F (39.2 C)  Recent Labs  Lab 08/05/20 1351  WBC 8.0  CREATININE 0.83  LATICACIDVEN 2.9*    CrCl cannot be calculated (Unknown ideal weight.).    No Known Allergies  Antimicrobials this admission: Vanco 12/20 >>  Cefepime 12/20 >>    Microbiology results: 12/20 BCx: pending 12/20 UCx: pending    Thank you for allowing pharmacy to be a part of this patient's care.  Judeth Cornfield, PharmD Clinical Pharmacist 08/05/2020 3:27 PM

## 2020-08-05 NOTE — Progress Notes (Signed)
Notified bedside nurse of need to draw repeat lactic acid. 

## 2020-08-05 NOTE — ED Notes (Signed)
Per patient and wife he is not to have IV in right arm due to past incident of x1 infiltration. Attending made aware and states not to remove IV at this time. Patient is difficult IV stick.

## 2020-08-05 NOTE — ED Provider Notes (Signed)
[facility]ENCY [facility]PARTMENT Provider Note   CSN: [id number] Arrival date & time: 08/05/20  1325     History Chief Complaint  Patient presents with  . Cough    Christian Ellison G Ellison is a 59 y.o. male with a hx of sleep apnea, prior HCAP, prior MRSA, prior aspiration pneumonia, polysubstance abuse, and hepatitis who presents to the emergency department via EMS with complaints of cough for the past 3 days.  Patient states that he has had a cough productive of phlegm sputum that varies in color with associated fever, chills, fatigue, shortness of breath, nausea, reflux, and vomiting.  No alleviating or aggravating factors to his symptoms.  States this feels similar to prior hospitalizations.  He makes variable statements regarding recent deaths in his family.  He is an overall poor historian.  He denies drug or alcohol use today.  He denies chest pain, abdominal pain, diarrhea, unilateral leg pain/swelling, prior VTE, or prior history of cancer.  He is status post ORIF of left talus and calcaneal fractures performed 1118/2021.  On chart review for additional history he has CHF listed in medical hx however last echo- 12/2017 with EF 55-60%, normal diastolic function parameters, and normal systolic function noted. Also had recent admission for acute hypoxic respiratory failure in the setting of multifocal pneumonia/aspiration pneumonia, blood cultures negative x 2, wheaned off oxygen and was able to be discharged on augmentin.   HPI     Past Medical History:  Diagnosis Date  . Anxiety   . Aspiration pneumonia (HCC) 05/26/2018   RLL  . Chronic pain   . Esophageal stricture   . GERD (gastroesophageal reflux disease)   . 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   Marland Kitchen. Opiate abuse, continuous (HCC)   . Sleep apnea    CPAP nightly    Patient Active Problem List   Diagnosis Date Noted  . Acute hypoxemic respiratory failure (HCC) 07/13/2020  . Pneumonia 06/24/2019  . Pneumonitis  06/24/2019  . Aspiration pneumonitis (HCC) 06/10/2019  . Closed fracture of xiphoid process   . PNA (pneumonia) 05/15/2019  . Multifocal pneumonia 03/11/2019  . Acute respiratory failure with hypoxemia (HCC) 03/11/2019  . Sinus tachycardia 03/11/2019  . Sepsis due to pneumonia (HCC) 03/11/2019  . Hypothyroidism 03/11/2019  . Bronchopneumonia 11/06/2018  . Acute Respiratory failure with hypoxia (2/2 Asp PNA) 11/06/2018  . Recurrent aspiration pneumonia (HCC)   . Fever 06/09/2018  . Tachycardia 06/09/2018  . Hypoxia 06/09/2018  . Hypotension 06/09/2018  . Esophageal motility disorder   . Aspiration pneumonia of right lower lobe (HCC)   . Sepsis (HCC) 05/26/2018  . Pulmonary edema 02/02/2018  . HCAP (healthcare-associated pneumonia) 02/02/2018  . Weakness generalized 02/02/2018  . Hypertension 02/02/2018  . CHF (congestive heart failure) (HCC) 02/02/2018  . Nausea & vomiting 02/02/2018  . Dysphagia 02/02/2018  . Esophageal stricture/stenosis/esophageal dysmotility with recurrent aspiration 02/02/2018  . Esophageal stenosis 02/02/2018  . GERD (gastroesophageal reflux disease) 02/02/2018  . Polysubstance abuse (HCC) 02/02/2018  . Acute respiratory failure with hypoxia (HCC)   . Transaminitis   . Elevated troponin   . Overdose 01/02/2018  . Hx of hepatitis C 01/17/2013  . Depression 01/17/2013  . Anxiety with depression 01/17/2013  . Right knee pain 01/17/2013    Past Surgical History:  Procedure Laterality Date  . BIOPSY  03/18/2018   Procedure: BIOPSY;  Surgeon: Malissa Hippoehman, Najeeb U, MD;  Location: [facility];  Service: Endoscopy;;  gastric   . COLONOSCOPY  N/A 11/22/2013   Dr. Karilyn Cota: Normal except for hemorrhoids.  Next colonoscopy 10 years.  . ESOPHAGEAL DILATION N/A 03/18/2018   Procedure: ESOPHAGEAL DILATION;  Surgeon: Malissa Hippo, MD;  Location: [facility];  Service: Endoscopy;  Laterality: N/A;  . ESOPHAGEAL MANOMETRY N/A 06/15/2018   Procedure: ESOPHAGEAL  MANOMETRY (EM);  Surgeon: Napoleon Form, MD;  Location: [facility];  Service: Endoscopy;  Laterality: N/A;  . ESOPHAGOGASTRODUODENOSCOPY (EGD) WITH PROPOFOL N/A 03/18/2018   Dr. Karilyn Cota: Benign-appearing mild esophageal stenosis proximal to the GE J, status post dilation.  2 cm hiatal hernia.  Gastritis, biopsies negative for H. pylori  . Fracture Right Leg     Patient has rod/screws in this leg  . ORIF CALCANEOUS FRACTURE Left 07/04/2020   Procedure: OPEN REDUCTION INTERNAL FIXATION (ORIF) LEFT TALUS AND CALCANEOUS FRACTURE, SUBTALAR JOINT ARTHROTOMY WITH REMOVAL OF LOOSE BODIES, TALONAVICULAR JOINT ARTHOTOMY WITH REMOVAL OF LOOSE BODIES;  Surgeon: Terance Hart, MD;  Location: [facility] OR;  Service: Orthopedics;  Laterality: Left;  . Rotor Cuff  2012   Right Shoulder       Family History  Problem Relation Age of Onset  . Breast cancer Mother   . Colon cancer Father   . Bladder Cancer Father   . Prostate cancer Father   . Hypertension Sister   . Hypothyroidism Sister   . Healthy Sister   . Healthy Daughter   . Healthy Son   . Healthy Son     Social History   Tobacco Use  . Smoking status: Never Smoker  . Smokeless tobacco: Never Used  Vaping Use  . Vaping Use: Never used  Substance Use Topics  . Alcohol use: Not Currently  . Drug use: Not Currently    Types: Benzodiazepines    Comment: opiates    Home Medications Prior to Admission medications   Medication Sig Start Date End Date Taking? Authorizing Provider  acetaminophen (TYLENOL) 325 MG tablet Take 2 tablets (650 mg total) by mouth every 6 (six) hours as needed for mild pain (or Fever >/= 101). 05/17/19   Shon Hale, MD  ALPRAZolam Prudy Feeler) 1 MG tablet Take 1 mg by mouth every 6 (six) hours as needed for anxiety.  10/22/19   [provider]  aspirin EC 81 MG tablet Take 1 tablet (81 mg total) by mouth daily with breakfast. Patient taking differently: Take 81 mg by mouth at bedtime.  03/13/19    Shon Hale, MD  Glucosamine HCl 1000 MG TABS Take 1,000 mg by mouth at bedtime.    [provider]  levothyroxine (SYNTHROID, LEVOTHROID) 25 MCG tablet Take 25 mcg by mouth daily. 05/13/18   [provider]  Multiple Vitamin (MULTIVITAMIN WITH MINERALS) TABS tablet Take 1 tablet by mouth daily. 50+    [provider]  oxyCODONE (OXY IR/ROXICODONE) 5 MG immediate release tablet Take 5 mg by mouth every 4 (four) hours as needed for severe pain.    [provider]  pantoprazole (PROTONIX) 40 MG tablet Take 1 tablet (40 mg total) by mouth 2 (two) times daily. 05/17/19 07/13/20  Shon Hale, MD    Allergies    Patient has no known allergies.  Review of Systems   Review of Systems  Constitutional: Positive for chills, fatigue and fever.  HENT: Positive for congestion.   Respiratory: Positive for cough and shortness of breath.   Cardiovascular: Negative for chest pain and leg swelling.  Gastrointestinal: Positive for vomiting. Negative for abdominal pain, anal bleeding,  blood in stool, constipation and diarrhea.  Genitourinary: Negative for dysuria.  Musculoskeletal: Negative for myalgias.  Neurological: Negative for syncope.  Psychiatric/Behavioral: Positive for dysphoric mood.  All other systems reviewed and are negative.   Physical Exam Updated Vital Signs BP (!) 128/91 (BP Location: Left Arm)   Pulse (!) 140   Temp (!) 102.6 F (39.2 C) (Oral)   Resp 18   SpO2 (!) 88%   Physical Exam Vitals and nursing note reviewed.  Constitutional:      Appearance: He is well-developed and well-nourished. He is ill-appearing.  HENT:     Head: Normocephalic and atraumatic.  Eyes:     General:        Right eye: No discharge.        Left eye: No discharge.     Conjunctiva/sclera: Conjunctivae normal.  Cardiovascular:     Rate and Rhythm: Regular rhythm. Tachycardia present.  Pulmonary:     Comments: Somewhat poor air movement throughout however  no obvious focal adventitious breath sounds. SPO2 85% on room air, placed on supplemental oxygen at 3 L via nasal cannula with improvement into the 90s.  and Respiration even and unlabored Abdominal:     General: There is no distension.     Palpations: Abdomen is soft.     Tenderness: There is no abdominal tenderness. There is no guarding or rebound.  Musculoskeletal:     Cervical back: Neck supple.     Comments: Left foot anterior surgical scar which appears well-healed, no overlying erythema, warmth, or drainage.  Mild localized edema to this area.  No lower leg edema.  No calf tenderness.  Skin:    General: Skin is warm and dry.     Findings: No rash.  Neurological:     Mental Status: He is alert.     Comments: Clear speech.   Psychiatric:        Mood and Affect: Mood and affect normal.        Behavior: Behavior normal.     ED Results / Procedures / Treatments   Labs (all labs ordered are listed, but only abnormal results are displayed) Labs Reviewed - No data to display  EKG EKG Interpretation  Date/Time:  Monday August 05 2020 14:33:20 EST Ventricular Rate:  117 PR Interval:    QRS Duration: 83 QT Interval:  314 QTC Calculation: 438 R Axis:   -22 Text Interpretation: Sinus tachycardia Borderline left axis deviation Borderline T wave abnormalities No STEMI Confirmed by Alona Bene [id number]) on 08/05/2020 2:36:16 PM   Radiology CT Angio Chest PE W/Cm &/Or Wo Cm  Result Date: 08/05/2020 CLINICAL DATA:  Chest pain and shortness of breath. EXAM: CT ANGIOGRAPHY CHEST WITH CONTRAST TECHNIQUE: Multidetector CT imaging of the chest was performed using the standard protocol during bolus administration of intravenous contrast. Multiplanar CT image reconstructions and MIPs were obtained to evaluate the vascular anatomy. CONTRAST:  OMNIPAQUE IOHEXOL 350 MG/ML SOLN COMPARISON:  06/10/2019 FINDINGS: Cardiovascular: Contrast injection is sufficient to demonstrate satisfactory  opacification of the pulmonary arteries to the segmental level. There is no pulmonary embolus or evidence of right heart strain. The size of the main pulmonary artery is normal. Heart size is normal, with no pericardial effusion. The course and caliber of the aorta are normal. There is no atherosclerotic calcification. Opacification decreased due to pulmonary arterial phase contrast bolus timing. Mediastinum/Nodes: --there are mildly enlarged mediastinal lymph nodes. --there are mildly enlarged hilar lymph nodes. For example there is a right  hilar lymph node measuring approximately 1.5 cm (axial series 4, image 44). This has increased in size since the prior study. -- No axillary lymphadenopathy. --there are few small but mildly enlarged left supraclavicular lymph nodes that are stable from prior study. --the esophagus is patulous and fluid-filled to the level of the upper thorax. This places the patient at risk for aspiration. -the thyroid gland is unremarkable. Lungs/Pleura: There are innumerable ground-glass airspace opacities in addition to multiple reticulonodular airspace opacities. These are similar in appearance to prior study but have progressed significantly. There is new extensive bronchial wall thickening and mucus plugging involving primarily the right lower lobe and to a lesser degree the left lower lobe. There is some debris within the patient's trachea. There is no pneumothorax or large pleural effusion. Upper Abdomen: Contrast bolus timing is not optimized for evaluation of the abdominal organs. The visualized portions of the organs of the upper abdomen are normal. Musculoskeletal: No chest wall abnormality. No bony spinal canal stenosis. Review of the MIP images confirms the above findings. IMPRESSION: 1. No acute pulmonary embolism. 2. Persistent bilateral ground-glass airspace opacities with reticulonodular airspace opacities as was described in the patient's CT from 2020. However, these have  progressed since prior study. In addition, there is new bronchial wall thickening and airway plugging involving the right lower lobe. Findings may be secondary to an atypical infectious process. However, aspiration is also within the differential diagnosis given the patient's dilated esophagus. 3. Patulous, dilated esophagus that is fluid-filled to the level of the upper thorax. This places the patient at risk for aspiration. Correlation for causes of a dilated esophagus is recommended. 4. Enlarged mediastinal and hilar lymph nodes, likely reactive. Electronically Signed   By: Katherine Mantle M.D.   On: 08/05/2020 16:26   DG Chest Port 1 View  Result Date: 08/05/2020 CLINICAL DATA:  Questionable sepsis. EXAM: PORTABLE CHEST 1 VIEW COMPARISON:  Chest x-ray 07/13/2020, 04/21/2020. FINDINGS: Mediastinum and hilar structures normal. Cardiomegaly. Persistent bilateral interstitial prominence noted suggesting pneumonitis. Mild right base atelectasis. No pleural effusion or pneumothorax. Degenerative change thoracic spine. IMPRESSION: 1. Cardiomegaly. 2. Persistent bilateral interstitial prominence noted suggesting pneumonitis. Mild right base atelectasis. Electronically Signed   By: Maisie Fus  Register   On: 08/05/2020 14:22    Procedures .Critical Care Performed by: Cherly Anderson, PA-C Authorized by: Cherly Anderson, PA-C    CRITICAL CARE Performed by: Harvie Heck   Total critical care time: 45 minutes  Critical care time was exclusive of separately billable procedures and treating other patients.  Critical care was necessary to treat or prevent imminent or life-threatening deterioration.  Critical care was time spent personally by me on the following activities: development of treatment plan with patient and/or surrogate as well as nursing, discussions with consultants, evaluation of patient's response to treatment, examination of patient, obtaining history from patient or  surrogate, ordering and performing treatments and interventions, ordering and review of laboratory studies, ordering and review of radiographic studies, pulse oximetry and re-evaluation of patient's condition.  (including critical care time)  Medications Ordered in ED Medications - No data to display  ED Course  I have reviewed the triage vital signs and the nursing notes.  Pertinent labs & imaging results that were available during my care of the patient were reviewed by me and considered in my medical decision making (see chart for details).    Christian Ellison was evaluated in Emergency Department on 08/05/2020 for the symptoms described in the history of  present illness. He/she was evaluated in the context of the global COVID-19 pandemic, which necessitated consideration that the patient might be at risk for infection with the SARS-CoV-2 virus that causes COVID-19. Institutional protocols and algorithms that pertain to the evaluation of patients at risk for COVID-19 are in a state of rapid change based on information released by regulatory bodies including the CDC and federal and state organizations. These policies and algorithms were followed during the patient's care in the ED.  MDM Rules/Calculators/A&P                         Patient presents to the ED with complaints of cough.  He is ill-appearing, hypoxic, tachycardic, and febrile.  He is not noted to be hypotensive.  Code sepsis initiated with antibiotics for suspected pulmonary source including Vanc, cefepime, and azithromycin, recent hospital admission with IV antibiotics as well as prior positive MRSA testing. He is an overall poor historian, seems somewhat similar to prior presentations, however today lungs seem fairly clear.   Additional history obtained:  Additional history obtained from chart review & nursing note review.   EKG: Sinus tachycardia, no STEMI.   Lab Tests:  I Ordered, reviewed, and interpreted labs, which  included:  CBC: Unremarkable.  CMP: Fairly unremarkable.  APTT/PT/INR: WNL Lactic acid: Elevated @ 2.9 COVID; negative Influenza: Negative UA: NO UTI.   Imaging Studies ordered:  I ordered imaging studies which included CXR, I independently visualized and interpreted imaging which showed 1. Cardiomegaly. 2. Persistent bilateral interstitial prominence noted suggesting pneumonitis. Mild right base atelectasis  Patient without new infiltrate on CXR & with fairly clear lungs & hypoxia will further evaluate for PE with CTA at this time.   CTA:  1. No acute pulmonary embolism. 2. Persistent bilateral ground-glass airspace opacities with reticulonodular airspace opacities as was described in the patient's CT from 2020. However, these have progressed since prior study. In addition, there is new bronchial wall thickening and airway plugging involving the right lower lobe. Findings may be secondary to an atypical infectious process. However, aspiration is also within the differential diagnosis given the patient's dilated esophagus. 3. Patulous, dilated esophagus that is fluid-filled to the level of the upper thorax. This places the patient at risk for aspiration. Correlation for causes of a dilated esophagus is recommended. 4. Enlarged mediastinal and hilar lymph nodes, likely reactive  No hypotension, lactic acid not > 4 therefore no 30 cc/kg bolus ordered at this time.  Patient continues to require supplemental oxygen will discuss w/ hospitalist service for admission.   17:03: CONSULT: Discussed with hospitalist Dr. Mariea Clonts- accepts admission.   Findings and plan of care discussed with supervising physician Dr. Jacqulyn Bath who is in agreement.   Portions of this note were generated with Scientist, clinical (histocompatibility and immunogenetics). Dictation errors may occur despite best attempts at proofreading.  Final Clinical Impression(s) / ED Diagnoses Final diagnoses:  Acute hypoxemic respiratory failure (HCC)  Sepsis, due to  unspecified organism, unspecified whether acute organ dysfunction present [facility])    Rx / DC Orders ED Discharge Orders    None       Cherly Anderson, PA-C 08/05/20 1708    Maia Plan, MD 08/06/20 [phone]

## 2020-08-05 NOTE — ED Triage Notes (Signed)
Left leg pain, history of fracture, abnormal vitals

## 2020-08-05 NOTE — ED Notes (Signed)
Wife updated

## 2020-08-05 NOTE — H&P (Signed)
History and Physical    Christian Ellison BJY:[id number] DOB: [birth year] DOA: 08/05/2020  PCP: Jason CoopNicholson, Sterling J IV, FNP   Patient coming from: Home  I have personally briefly reviewed patient's [facility]  Chief Complaint: Difficulty breathing  HPI: Christian Ellison is a 59 y.o. male with medical history significant for esophageal strictures, hepatitis C, CHF, hypertension. Patient presented to the ED with complaints of difficulty breathing, cough of 3 days duration.  Multiple hospitalizations over the past few years for similar issue of aspiration pneumonia due to esophageal issues that patient has had. Patient reports episodes of regurgitation over the past few days.  A few episodes of coughing while eating.  Reports after eating he sits up for several hours before sleeping.  He is unaware of aspiration episodes during his sleep. No chest pain, recent right ankle surgery with mild swelling otherwise no significant lower extremity swelling.  Last hospitalization 12/27-12/29 for aspiration pneumonia treated with IV vancomycin and cefepime, discharged on Augmentin. Admission 3/14-3/15 for same.  ED Course: Febrile to 102.6, initial tachycardia to 140, blood pressure 142 to 146 systolic.  O2 sats 88% on room air currently on 3 L nasal cannula.  Lactic acid 2.9.  WBC- 8.  UA small hemoglobin.  UDS positive for benzos.  Covid test negative. CTA chest-negative for PE, shows persistent bilateral groundglass airspace opacities with reticular nodular airspace opacities, progressed from prior study.  Right lower lobe new bronchial wall thickening and air plugging.  Differentials atypical infectious process, aspiration.  Also patulous and dilated esophagus that is fluid-filled to the level of the upper thorax. IV vancomycin and cefepime started.  500ml bolus given. Hospitalist to admit for evaluation and management.  Review of Systems: As per HPI all other systems  reviewed and negative.  Past Medical History:  Diagnosis Date  . Anxiety   . Aspiration pneumonia (HCC) 05/26/2018   RLL  . Chronic pain   . Esophageal stricture   . GERD (gastroesophageal reflux disease)   . 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   Marland Kitchen. Opiate abuse, continuous (HCC)   . Sleep apnea    CPAP nightly    Past Surgical History:  Procedure Laterality Date  . BIOPSY  03/18/2018   Procedure: BIOPSY;  Surgeon: Malissa Hippoehman, Najeeb U, MD;  Location: [facility];  Service: Endoscopy;;  gastric   . COLONOSCOPY N/A 11/22/2013   Dr. Karilyn Cotaehman: Normal except for hemorrhoids.  Next colonoscopy 10 years.  . ESOPHAGEAL DILATION N/A 03/18/2018   Procedure: ESOPHAGEAL DILATION;  Surgeon: Malissa Hippoehman, Najeeb U, MD;  Location: [facility];  Service: Endoscopy;  Laterality: N/A;  . ESOPHAGEAL MANOMETRY N/A 06/15/2018   Procedure: ESOPHAGEAL MANOMETRY (EM);  Surgeon: Napoleon FormNandigam, Kavitha V, MD;  Location: [facility];  Service: Endoscopy;  Laterality: N/A;  . ESOPHAGOGASTRODUODENOSCOPY (EGD) WITH PROPOFOL N/A 03/18/2018   Dr. Karilyn Cotaehman: Benign-appearing mild esophageal stenosis proximal to the GE J, status post dilation.  2 cm hiatal hernia.  Gastritis, biopsies negative for H. pylori  . Fracture Right Leg     Patient has rod/screws in this leg  . ORIF CALCANEOUS FRACTURE Left 07/04/2020   Procedure: OPEN REDUCTION INTERNAL FIXATION (ORIF) LEFT TALUS AND CALCANEOUS FRACTURE, SUBTALAR JOINT ARTHROTOMY WITH REMOVAL OF LOOSE BODIES, TALONAVICULAR JOINT ARTHOTOMY WITH REMOVAL OF LOOSE BODIES;  Surgeon: Terance HartAdair, Christopher R, MD;  Location: [facility] OR;  Service: Orthopedics;  Laterality: Left;  . Rotor Cuff  2012   Right Shoulder  reports that he has never smoked. He has never used smokeless tobacco. He reports previous alcohol use. He reports previous drug use. Drug: Benzodiazepines.  No Known Allergies  Family History  Problem Relation Age of Onset  . Breast cancer Mother   . Colon cancer Father    . Bladder Cancer Father   . Prostate cancer Father   . Hypertension Sister   . Hypothyroidism Sister   . Healthy Sister   . Healthy Daughter   . Healthy Son   . Healthy Son     Prior to Admission medications   Medication Sig Start Date End Date Taking? Authorizing Provider  acetaminophen (TYLENOL) 325 MG tablet Take 2 tablets (650 mg total) by mouth every 6 (six) hours as needed for mild pain (or Fever >/= 101). 05/17/19  Yes Bell Cai, Courage, MD  ALPRAZolam Prudy Feeler) 1 MG tablet Take 1 mg by mouth every 6 (six) hours as needed for anxiety.  10/22/19  Yes [provider]  aspirin EC 81 MG tablet Take 1 tablet (81 mg total) by mouth daily with breakfast. Patient taking differently: Take 81 mg by mouth at bedtime. 03/13/19  Yes Karalynn Cottone, Courage, MD  Glucosamine HCl 1000 MG TABS Take 1,000 mg by mouth at bedtime.   Yes [provider]  levothyroxine (SYNTHROID, LEVOTHROID) 25 MCG tablet Take 25 mcg by mouth daily. 05/13/18  Yes [provider]  Multiple Vitamin (MULTIVITAMIN WITH MINERALS) TABS tablet Take 1 tablet by mouth daily. 50+   Yes [provider]  pantoprazole (PROTONIX) 40 MG tablet Take 1 tablet (40 mg total) by mouth 2 (two) times daily. 05/17/19 07/13/20 Yes Shon Hale, MD    Physical Exam: Vitals:   08/05/20 1400 08/05/20 1500 08/05/20 1612 08/05/20 1621  BP: 132/78 (!) 145/87  135/76  Pulse: (!) 114 (!) 114  (!) 106  Resp:  (!) 23  17  Temp:   (!) 101.3 F (38.5 C)   TempSrc:   Oral   SpO2: 92% 92%  96%    Constitutional: NAD, calm, comfortable Vitals:   08/05/20 1400 08/05/20 1500 08/05/20 1612 08/05/20 1621  BP: 132/78 (!) 145/87  135/76  Pulse: (!) 114 (!) 114  (!) 106  Resp:  (!) 23  17  Temp:   (!) 101.3 F (38.5 C)   TempSrc:   Oral   SpO2: 92% 92%  96%   Eyes: PERRL, lids and conjunctivae normal ENMT: Mucous membranes are moist.  Neck: normal, supple, no masses, no thyromegaly Respiratory: clear to auscultation  bilaterally, no wheezing, no crackles. Normal respiratory effort. No accessory muscle use.  Cardiovascular: Regular rate and rhythm, no murmurs / rubs / gallops. No extremity edema. 2+ pedal pulses.  Abdomen: no tenderness, no masses palpated. No hepatosplenomegaly. Bowel sounds positive.  Musculoskeletal: no clubbing / cyanosis.  Recent right ankle surgery, right ankle slightly swollen compared to left, no significant pitting.   no contractures. Normal muscle tone.  Skin: no rashes, lesions, ulcers. No induration Neurologic: No apparent cranial abnormality, moving extremities spontaneously.Marland Kitchen  Psychiatric: Normal judgment and insight. Alert and oriented x 3. Normal mood.   Labs on Admission: I have personally reviewed following labs and imaging studies  CBC: Recent Labs  Lab 08/05/20 1351  WBC 8.0  NEUTROABS 6.9  HGB 13.4  HCT 41.6  MCV 90.4  PLT 198   Basic Metabolic Panel: Recent Labs  Lab 08/05/20 1351  NA 138  K 3.8  CL 100  CO2 28  GLUCOSE 120*  BUN 12  CREATININE 0.83  CALCIUM 8.8*   Liver Function Tests: Recent Labs  Lab 08/05/20 1351  AST 19  ALT 14  ALKPHOS 69  BILITOT 0.5  PROT 7.9  ALBUMIN 4.3   Coagulation Profile: Recent Labs  Lab 08/05/20 1351  INR 1.0   Urine analysis:    Component Value Date/Time   COLORURINE YELLOW 08/05/2020 1351   APPEARANCEUR CLEAR 08/05/2020 1351   LABSPEC 1.015 08/05/2020 1351   PHURINE 6.0 08/05/2020 1351   GLUCOSEU NEGATIVE 08/05/2020 1351   HGBUR SMALL (A) 08/05/2020 1351   BILIRUBINUR NEGATIVE 08/05/2020 1351   KETONESUR NEGATIVE 08/05/2020 1351   PROTEINUR NEGATIVE 08/05/2020 1351   NITRITE NEGATIVE 08/05/2020 1351   LEUKOCYTESUR NEGATIVE 08/05/2020 1351    Radiological Exams on Admission: CT Angio Chest PE W/Cm &/Or Wo Cm  Result Date: 08/05/2020 CLINICAL DATA:  Chest pain and shortness of breath. EXAM: CT ANGIOGRAPHY CHEST WITH CONTRAST TECHNIQUE: Multidetector CT imaging of the chest was performed  using the standard protocol during bolus administration of intravenous contrast. Multiplanar CT image reconstructions and MIPs were obtained to evaluate the vascular anatomy. CONTRAST:  OMNIPAQUE IOHEXOL 350 MG/ML SOLN COMPARISON:  06/10/2019 FINDINGS: Cardiovascular: Contrast injection is sufficient to demonstrate satisfactory opacification of the pulmonary arteries to the segmental level. There is no pulmonary embolus or evidence of right heart strain. The size of the main pulmonary artery is normal. Heart size is normal, with no pericardial effusion. The course and caliber of the aorta are normal. There is no atherosclerotic calcification. Opacification decreased due to pulmonary arterial phase contrast bolus timing. Mediastinum/Nodes: --there are mildly enlarged mediastinal lymph nodes. --there are mildly enlarged hilar lymph nodes. For example there is a right hilar lymph node measuring approximately 1.5 cm (axial series 4, image 44). This has increased in size since the prior study. -- No axillary lymphadenopathy. --there are few small but mildly enlarged left supraclavicular lymph nodes that are stable from prior study. --the esophagus is patulous and fluid-filled to the level of the upper thorax. This places the patient at risk for aspiration. -the thyroid gland is unremarkable. Lungs/Pleura: There are innumerable ground-glass airspace opacities in addition to multiple reticulonodular airspace opacities. These are similar in appearance to prior study but have progressed significantly. There is new extensive bronchial wall thickening and mucus plugging involving primarily the right lower lobe and to a lesser degree the left lower lobe. There is some debris within the patient's trachea. There is no pneumothorax or large pleural effusion. Upper Abdomen: Contrast bolus timing is not optimized for evaluation of the abdominal organs. The visualized portions of the organs of the upper abdomen are normal.  Musculoskeletal: No chest wall abnormality. No bony spinal canal stenosis. Review of the MIP images confirms the above findings. IMPRESSION: 1. No acute pulmonary embolism. 2. Persistent bilateral ground-glass airspace opacities with reticulonodular airspace opacities as was described in the patient's CT from 2020. However, these have progressed since prior study. In addition, there is new bronchial wall thickening and airway plugging involving the right lower lobe. Findings may be secondary to an atypical infectious process. However, aspiration is also within the differential diagnosis given the patient's dilated esophagus. 3. Patulous, dilated esophagus that is fluid-filled to the level of the upper thorax. This places the patient at risk for aspiration. Correlation for causes of a dilated esophagus is recommended. 4. Enlarged mediastinal and hilar lymph nodes, likely reactive. Electronically Signed   By: Katherine Mantle M.D.   On: 08/05/2020  16:26   DG Chest Port 1 View  Result Date: 08/05/2020 CLINICAL DATA:  Questionable sepsis. EXAM: PORTABLE CHEST 1 VIEW COMPARISON:  Chest x-ray 07/13/2020, 04/21/2020. FINDINGS: Mediastinum and hilar structures normal. Cardiomegaly. Persistent bilateral interstitial prominence noted suggesting pneumonitis. Mild right base atelectasis. No pleural effusion or pneumothorax. Degenerative change thoracic spine. IMPRESSION: 1. Cardiomegaly. 2. Persistent bilateral interstitial prominence noted suggesting pneumonitis. Mild right base atelectasis. Electronically Signed   By: Maisie Fus  Register   On: 08/05/2020 14:22    EKG: Independently reviewed.  Sinus tachycardia rate 117, QTc 438.  Assessment/Plan Principal Problem:   Severe sepsis (HCC) Active Problems:   Hx of hepatitis C   Anxiety with depression   Acute respiratory failure with hypoxia (HCC)   Hypertension   CHF (congestive heart failure) (HCC)   Recurrent aspiration pneumonia (HCC)   Hypothyroidism    PNA (pneumonia)   Severe sepsis 2/2 recurrent aspiration pneumonia- febrile to 102.6, tachycardic to 140, with lactic acidosis of 2.9 and endorgan dysfunction with hypoxic respiratory failure.  Recent and multiple hospitalizations for same.  CTA chest shows persistent bilateral groundglass opacities progressed from prior study-compared to 2020. -Recurrent aspirations likely due to esophageal disease -Start antibiotics to include anaerobic coverage with IV Zosyn -Aspiration precautions -1.5 L bolus given, continue D5/Ns 100cc/hr x 20hrs. - BMP, CBC in the morning  -Albuterol inhaler as needed,  - NPO, mucolytics held - Trend Lactate -Keep head of bed elevated at > 30 degrees -Blood and urine cultures  Acute hypoxic respiratory failure-O2 sats 88% on room air, currently on 3 L sats now 97%.  Likely due to multifocal pneumonia -Supplemental oxygen  Esophageal dysmotility/stenosis/ stricture- CT today showing patulous dilated esophagus fluid-filled to the level of upper thorax.  He is at risk for aspiration. -GI consult -N.p.o. - IV protnix 40 daily  Hypertension-BP currently soft systolic 104/68.   Hypothyroidism -Hold Synthroid for now   DVT prophylaxis: Heparin Code Status: Full code Family Communication: Significant other at bedside Disposition Plan: ~ 2 days, pending resolution of Sepsis physiology, treatment for pneumonia and GI evaluation. Consults called:  GI Admission status: Inpatient, telemetry I certify that at the point of admission it is my clinical judgment that the patient will require inpatient hospital care spanning beyond 2 midnights from the point of admission due to high intensity of service, high risk for further deterioration and high frequency of surveillance required. The following factors support the patient status of inpatient: Severe sepsis requiring IV antibiotics, and inpatient GI evaluation.   Onnie Boer MD [facility]  08/05/2020,  7:28 PM

## 2020-08-05 NOTE — Progress Notes (Signed)
Pharmacy Antibiotic Note  Christian Ellison is a 59 y.o. male admitted on 08/05/2020 with pneumonia. Pt has had multiple admissions over the past few years for aspiration pneumonia, due to esophageal issues (dysmotilitiy, stenosis, stricture). Pharmacy was initially consulted for vancomycin and cefepime dosing, which have been discontinued, and pharmacy is now consulted for Zosyn dosing for recurrent aspiration pneumonia.  WBC 8.0, Tmax 102.6; Scr 0.83, CrCl >100 ml/min (renal function stable from last month)  Plan: Zosyn 3.375 gm IV Q 8 hrs (extended infusion) Monitor WBC, temp, clinical improvement, cultures, renal function  Height: 6' (182.9 cm) Weight: 89.1 kg (196 lb 6.9 oz) IBW/kg (Calculated) : 77.6  Temp (24hrs), Avg:100.9 F (38.3 C), Min:98.7 F (37.1 C), Max:102.6 F (39.2 C)  Recent Labs  Lab 08/05/20 1351  WBC 8.0  CREATININE 0.83  LATICACIDVEN 2.9*    Estimated Creatinine Clearance: 106.5 mL/min (by C-G formula based on SCr of 0.83 mg/dL).    No Known Allergies  Antimicrobials this admission: 12/20 vancomycin X 1 12/20 cefepime X 1 12/20 azithromycin X 1 12/20 Zosyn >>  Microbiology results: 12/20 BCx X 2: pending 12/20 UCx: pending  12/20 COVID, flu A, flu B: negative  Thank you for allowing pharmacy to be a part of this patient's care.  Vicki Mallet, PharmD, BCPS, [facility] Clinical Pharmacist 08/05/2020 7:28 PM

## 2020-08-06 ENCOUNTER — Encounter (HOSPITAL_COMMUNITY): Payer: Self-pay | Admitting: Internal Medicine

## 2020-08-06 DIAGNOSIS — R933 Abnormal findings on diagnostic imaging of other parts of digestive tract: Secondary | ICD-10-CM

## 2020-08-06 DIAGNOSIS — A419 Sepsis, unspecified organism: Principal | ICD-10-CM

## 2020-08-06 DIAGNOSIS — F418 Other specified anxiety disorders: Secondary | ICD-10-CM

## 2020-08-06 DIAGNOSIS — K224 Dyskinesia of esophagus: Secondary | ICD-10-CM

## 2020-08-06 LAB — BASIC METABOLIC PANEL
Anion gap: 9 (ref 5–15)
BUN: 11 mg/dL (ref 6–20)
CO2: 31 mmol/L (ref 22–32)
Calcium: 8.7 mg/dL — ABNORMAL LOW (ref 8.9–10.3)
Chloride: 100 mmol/L (ref 98–111)
Creatinine, Ser: 0.8 mg/dL (ref 0.61–1.24)
GFR, Estimated: >60 mL/min (ref >60)
Glucose, Bld: 120 mg/dL — ABNORMAL HIGH (ref 70–99)
Potassium: 3.8 mmol/L (ref 3.5–5.1)
Sodium: 140 mmol/L (ref 135–145)

## 2020-08-06 LAB — CBC
HCT: 41.6 % (ref 39.0–52.0)
Hemoglobin: 13.1 g/dL (ref 13.0–17.0)
MCH: 29.1 pg (ref 26.0–34.0)
MCHC: 31.5 g/dL (ref 30.0–36.0)
MCV: 92.4 fL (ref 80.0–100.0)
Platelets: 176 10*3/uL (ref 150–400)
RBC: 4.5 MIL/uL (ref 4.22–5.81)
RDW: 12.5 % (ref 11.5–15.5)
WBC: 8.1 10*3/uL (ref 4.0–10.5)
nRBC: 0 % (ref 0.0–0.2)

## 2020-08-06 LAB — URINE CULTURE: Culture: NO GROWTH

## 2020-08-06 MED ORDER — ALPRAZOLAM 1 MG PO TABS
1.0000 mg | ORAL_TABLET | Freq: Four times a day (QID) | ORAL | Status: DC | PRN
  Administered 2020-08-06 – 2020-08-07 (×5): 1 mg via ORAL
  Filled 2020-08-06 (×5): qty 1

## 2020-08-06 NOTE — Progress Notes (Signed)
PROGRESS NOTE  Christian Ellison BJY:[id number] DOB: 1960/09/01 DOA: 08/05/2020 PCP: Jason CoopNicholson, Sterling J IV, FNP  Brief History:  59 year old male with a history of esophageal dysmotility/esophageal stricture, recurrent aspiration pneumonia, hypothyroidism, anxiety, prior polysubstance abuse presenting with 3-day history of coughing and shortness of breath.  The patient feels as if he is " regurgitating my food" again for the past 3 days. The patient is a very difficult historian, and he often is tangential regarding his historical perspective.  Nevertheless, it appears that the patient has had worsening cough with posttussive emesis for the past 3 days.  He has has had fevers and chills, but denies any headache, neck pain, chest pain, hematemesis, abdominal pain, dysuria, hematuria, diarrhea, hematochezia, melena. In the emergency department, the patient was febrile up to 102.6 F and tachycardic up to 140.  He was hemodynamically stable.  Oxygen saturation was down to 88% on room air.  The patient was placed on 3 L nasal cannula with oxygen saturation up to 94%.  WBC 8.0, hemoglobin 13.4, platelets 190,000.  BMP was essentially unremarkable.  Lactic acid peaked at 2.9.  CTA chest was negative for PE but showed extensive bronchial wall thickening and mucous plugging RLL>LLL; innumerable GGO and reticulonodular opacities; patulous esophagus fluid-filled to the level of the upper thorax; debris in the trachea; the patient was started on IV fluids and Zosyn.  Assessment/Plan: Severe sepsis -Secondary to pneumonia -Present on admission -Presented with tachycardia, fever, and elevated lactic acid -Continue IV fluids -Continue IV Zosyn -Follow blood cultures -UA negative for pyuria  Aspiration pneumonia -CTA chest negative for PE; other findings as discussed above -Continue Zosyn  Acute respiratory failure with hypoxia -Secondary to pneumonia -personally reviewed CXR--scattered  GGO -stable on 4L -wean oxygen for saturation 90-92%  Esophageal dysmotility/stricture -GI consult -Continue Protonix -holding diltiazem temporarily due to soft BPs  Anxiety/depression -Continue home dose alprazolam -PMP Aware reviewed--receives alprazolam #120 monthly  Hypothyroidism -Continue Synthroid  Recent left calcaneus/talus fracture -Status post ORIF 07/04/2020--Dr. Susa SimmondsAdair     Status is: Inpatient  Remains inpatient appropriate because:IV treatments appropriate due to intensity of illness or inability to take PO   Dispo: The patient is from: Home              Anticipated d/c is to: Home              Anticipated d/c date is: 2 days              Patient currently is not medically stable to d/c.        Family Communication:  no Family at bedside  Consultants:  none  Code Status:  FULL   DVT Prophylaxis:  [place] Heparin   Procedures: As Listed in Progress Note Above  Antibiotics: Zosyn 08/05/20>>     Subjective: Patient feels he is breathing better this am.  No n/v since admission.  Denies f/c, cp, sob, n/v/d, abd pain, dysuria, hematuria  Objective: Vitals:   08/05/20 1922 08/05/20 2044 08/06/20 0431 08/06/20 0458  BP:  114/64 (!) 180/90   Pulse:  73 (!) 117 96  Resp:  18 18   Temp:  99.8 F (37.7 C) 97.7 F (36.5 C)   TempSrc:      SpO2: 94% 94% 94%   Weight:      Height:        Intake/Output Summary (Last 24 hours) at 08/06/2020 0815 Last data filed at 08/06/2020 0500  Gross per 24 hour  Intake 2759.73 ml  Output --  Net 2759.73 ml   Weight change:  Exam:   General:  Pt is alert, follows commands appropriately, not in acute distress  HEENT: No icterus, No thrush, No neck mass, /AT  Cardiovascular: RRR, S1/S2, no rubs, no gallops  Respiratory: bibasilar rales. No wheeze  Abdomen: Soft/+BS, non tender, non distended, no guarding  Extremities: No edema, No lymphangitis, No petechiae, No rashes, no synovitis   Data  Reviewed: I have personally reviewed following labs and imaging studies Basic Metabolic Panel: Recent Labs  Lab 08/05/20 1351 08/06/20 0552  NA 138 140  K 3.8 3.8  CL 100 100  CO2 28 31  GLUCOSE 120* 120*  BUN 12 11  CREATININE 0.83 0.80  CALCIUM 8.8* 8.7*   Liver Function Tests: Recent Labs  Lab 08/05/20 1351  AST 19  ALT 14  ALKPHOS 69  BILITOT 0.5  PROT 7.9  ALBUMIN 4.3   No results for input(s): LIPASE, AMYLASE in the last 168 hours. No results for input(s): AMMONIA in the last 168 hours. Coagulation Profile: Recent Labs  Lab 08/05/20 1351  INR 1.0   CBC: Recent Labs  Lab 08/05/20 1351 08/06/20 0552  WBC 8.0 8.1  NEUTROABS 6.9  --   HGB 13.4 13.1  HCT 41.6 41.6  MCV 90.4 92.4  PLT 198 176   Cardiac Enzymes: No results for input(s): CKTOTAL, CKMB, CKMBINDEX, TROPONINI in the last 168 hours. BNP: Invalid input(s): POCBNP CBG: No results for input(s): GLUCAP in the last 168 hours. HbA1C: No results for input(s): HGBA1C in the last 72 hours. Urine analysis:    Component Value Date/Time   COLORURINE YELLOW 08/05/2020 1351   APPEARANCEUR CLEAR 08/05/2020 1351   LABSPEC 1.015 08/05/2020 1351   PHURINE 6.0 08/05/2020 1351   GLUCOSEU NEGATIVE 08/05/2020 1351   HGBUR SMALL (A) 08/05/2020 1351   BILIRUBINUR NEGATIVE 08/05/2020 1351   KETONESUR NEGATIVE 08/05/2020 1351   PROTEINUR NEGATIVE 08/05/2020 1351   NITRITE NEGATIVE 08/05/2020 1351   LEUKOCYTESUR NEGATIVE 08/05/2020 1351   Sepsis Labs: @LABRCNTIP (procalcitonin:4,lacticidven:4) ) Recent Results (from the past 240 hour(s))  Resp Panel by RT-PCR (Flu A&B, Covid) Nasopharyngeal Swab     Status: None   Collection Time: 08/05/20  2:00 PM   Specimen: Nasopharyngeal Swab; Nasopharyngeal(NP) swabs in vial transport medium  Result Value Ref Range Status   SARS Coronavirus 2 by RT PCR NEGATIVE NEGATIVE Final    Comment: (NOTE) SARS-CoV-2 target nucleic acids are NOT DETECTED.  The SARS-CoV-2 RNA  is generally detectable in upper respiratory specimens during the acute phase of infection. The lowest concentration of SARS-CoV-2 viral copies this assay can detect is 138 copies/mL. A negative result does not preclude SARS-Cov-2 infection and should not be used as the sole basis for treatment or other patient management decisions. A negative result may occur with  improper specimen collection/handling, submission of specimen other than nasopharyngeal swab, presence of viral mutation(s) within the areas targeted by this assay, and inadequate number of viral copies(<138 copies/mL). A negative result must be combined with clinical observations, patient history, and epidemiological information. The expected result is Negative.  Fact Sheet for Patients:  08/07/20  Fact Sheet for Healthcare Providers:  BloggerCourse.com  This test is no t yet approved or cleared by the [place] FDA and  has been authorized for detection and/or diagnosis of SARS-CoV-2 by FDA under an Emergency Use Authorization (EUA). This EUA will remain  in effect (meaning this  test can be used) for the duration of the COVID-19 declaration under Section 564(b)(1) of the Act, 21 U.S.C.section 360bbb-3(b)(1), unless the authorization is terminated  or revoked sooner.       Influenza A by PCR NEGATIVE NEGATIVE Final   Influenza B by PCR NEGATIVE NEGATIVE Final    Comment: (NOTE) The Xpert Xpress SARS-CoV-2/FLU/RSV plus assay is intended as an aid in the diagnosis of influenza from Nasopharyngeal swab specimens and should not be used as a sole basis for treatment. Nasal washings and aspirates are unacceptable for Xpert Xpress SARS-CoV-2/FLU/RSV testing.  Fact Sheet for Patients: BloggerCourse.com  Fact Sheet for Healthcare Providers: SeriousBroker.it  This test is not yet approved or cleared by the  [place] FDA and has been authorized for detection and/or diagnosis of SARS-CoV-2 by FDA under an Emergency Use Authorization (EUA). This EUA will remain in effect (meaning this test can be used) for the duration of the COVID-19 declaration under Section 564(b)(1) of the Act, 21 U.S.C. section 360bbb-3(b)(1), unless the authorization is terminated or revoked.  Performed at [facility], [address], [place], [place] [address]      Scheduled Meds: . heparin  5,000 Units Subcutaneous Q8H  . pantoprazole (PROTONIX) IV  40 mg Intravenous Q24H   Continuous Infusions: . dextrose 5 % and 0.9% NaCl 100 mL/hr at 08/05/20 2344  . piperacillin-tazobactam (ZOSYN)  IV 3.375 g (08/06/20 0500)    Procedures/Studies: CT Angio Chest PE W/Cm &/Or Wo Cm  Result Date: 08/05/2020 CLINICAL DATA:  Chest pain and shortness of breath. EXAM: CT ANGIOGRAPHY CHEST WITH CONTRAST TECHNIQUE: Multidetector CT imaging of the chest was performed using the standard protocol during bolus administration of intravenous contrast. Multiplanar CT image reconstructions and MIPs were obtained to evaluate the vascular anatomy. CONTRAST:  OMNIPAQUE IOHEXOL 350 MG/ML SOLN COMPARISON:  06/10/2019 FINDINGS: Cardiovascular: Contrast injection is sufficient to demonstrate satisfactory opacification of the pulmonary arteries to the segmental level. There is no pulmonary embolus or evidence of right heart strain. The size of the main pulmonary artery is normal. Heart size is normal, with no pericardial effusion. The course and caliber of the aorta are normal. There is no atherosclerotic calcification. Opacification decreased due to pulmonary arterial phase contrast bolus timing. Mediastinum/Nodes: --there are mildly enlarged mediastinal lymph nodes. --there are mildly enlarged hilar lymph nodes. For example there is a right hilar lymph node measuring approximately 1.5 cm (axial series 4, image 44). This has increased in size  since the prior study. -- No axillary lymphadenopathy. --there are few small but mildly enlarged left supraclavicular lymph nodes that are stable from prior study. --the esophagus is patulous and fluid-filled to the level of the upper thorax. This places the patient at risk for aspiration. -the thyroid gland is unremarkable. Lungs/Pleura: There are innumerable ground-glass airspace opacities in addition to multiple reticulonodular airspace opacities. These are similar in appearance to prior study but have progressed significantly. There is new extensive bronchial wall thickening and mucus plugging involving primarily the right lower lobe and to a lesser degree the left lower lobe. There is some debris within the patient's trachea. There is no pneumothorax or large pleural effusion. Upper Abdomen: Contrast bolus timing is not optimized for evaluation of the abdominal organs. The visualized portions of the organs of the upper abdomen are normal. Musculoskeletal: No chest wall abnormality. No bony spinal canal stenosis. Review of the MIP images confirms the above findings. IMPRESSION: 1. No acute pulmonary embolism. 2. Persistent bilateral ground-glass airspace opacities  with reticulonodular airspace opacities as was described in the patient's CT from 2020. However, these have progressed since prior study. In addition, there is new bronchial wall thickening and airway plugging involving the right lower lobe. Findings may be secondary to an atypical infectious process. However, aspiration is also within the differential diagnosis given the patient's dilated esophagus. 3. Patulous, dilated esophagus that is fluid-filled to the level of the upper thorax. This places the patient at risk for aspiration. Correlation for causes of a dilated esophagus is recommended. 4. Enlarged mediastinal and hilar lymph nodes, likely reactive. Electronically Signed   By: Katherine Mantle M.D.   On: 08/05/2020 16:26   DG Chest Port 1  View  Result Date: 08/05/2020 CLINICAL DATA:  Questionable sepsis. EXAM: PORTABLE CHEST 1 VIEW COMPARISON:  Chest x-ray 07/13/2020, 04/21/2020. FINDINGS: Mediastinum and hilar structures normal. Cardiomegaly. Persistent bilateral interstitial prominence noted suggesting pneumonitis. Mild right base atelectasis. No pleural effusion or pneumothorax. Degenerative change thoracic spine. IMPRESSION: 1. Cardiomegaly. 2. Persistent bilateral interstitial prominence noted suggesting pneumonitis. Mild right base atelectasis. Electronically Signed   By: Maisie Fus  Register   On: 08/05/2020 14:22   DG Chest Portable 1 View  Result Date: 07/13/2020 CLINICAL DATA:  Shortness of breath and chest pain. EXAM: PORTABLE CHEST 1 VIEW COMPARISON:  06/12/2020. FINDINGS: Normal heart size. No pleural effusion. Increased reticular interstitial opacities are identified with a lower lung zone predominance. Superimposed nodular densities are identified within both lower lobes, increased from previous exam. IMPRESSION: Increase lower lung zone reticular and nodular interstitial opacities are concerning for either aspiration or multifocal pneumonia. Followup PA and lateral chest X-ray is recommended in 3-4 weeks following trial of antibiotic therapy to ensure resolution and exclude underlying malignancy. Electronically Signed   By: Signa Kell M.D.   On: 07/13/2020 09:53    Catarina Hartshorn, DO  [facility]  If 7PM-7AM, please contact night-coverage [email or web address] Password [email or web address] 08/06/2020, 8:15 AM   LOS: 1 day

## 2020-08-06 NOTE — Progress Notes (Signed)
Pt has been seen and evaluated by MD Tat this am. Pt states, "I don't want to stay here, I want to go home but the doctor says I need to stay and if I leave I gotta sign out against his advice. Really, yall aint giving me my regular medicines, so why am I still here?" Advised pt regarding his diagnosis of sepsis and need for continued IV antibiotics. Asking to eat and is currently drinking water from a water bottle brought in by his wife. Advised reason for NPO status r/t aspiration. Pt states, "My only problem is stress and then yall don't give me my meds and it really messes me up." Pt making frequent swallowing motions, gurgling sounds noted, frequent throat clearing as well. MD Tat notified of pt's request for his home meds and list provided, and also for request for food.

## 2020-08-06 NOTE — TOC Initial Note (Signed)
Transition of Care [facility]) - Initial/Assessment Note    Patient Details  Name: Christian Ellison MRN: [id number] Date of Birth: [birth year]  Transition of [facility]) CM/SW Contact:    Karn Cassis, LCSW Phone Number: 08/06/2020, 1:21 PM  Clinical Narrative:  Pt admitted with severe sepsis due to recurrent aspiration pneumonia. LCSW completed assessment due to high risk readmission score. Pt reports he lives with his wife and 30 year old daughter. Pt is independent with ADLs. 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 Pt states he is currently using crutches due to a broken foot. Pt plans to return home when medically stable. No needs reported at this time. TOC will continue to follow.                   Expected Discharge Plan: Home/Self Care Barriers to Discharge: Continued Medical Work up   Patient Goals and CMS Choice Patient states their goals for this hospitalization and ongoing recovery are:: return home      Expected Discharge Plan and Services Expected Discharge Plan: Home/Self Care In-house Referral: Clinical Social Work     Living arrangements for the past 2 months: Single Family Home                 DME Arranged: N/A           HH Agency: NA        Prior Living Arrangements/Services Living arrangements for the past 2 months: Single Family Home Lives with:: Spouse Patient language and need for interpreter reviewed:: Yes Do you feel safe going back to the place where you live?: Yes      Need for Family Participation in Patient Care: No (Comment)     Criminal Activity/Legal Involvement Pertinent to Current Situation/Hospitalization: No - Comment as needed  Activities of Daily Living Home Assistive Devices/Equipment: CPAP,Blood pressure cuff,Wheelchair,Shower chair with back,Raised toilet seat with rails,Grab bars in shower,Hand-held shower hose ADL Screening (condition at time of admission) Patient's  cognitive ability adequate to safely complete daily activities?: Yes Is the patient deaf or have difficulty hearing?: No Does the patient have difficulty seeing, even when wearing glasses/contacts?: No Does the patient have difficulty concentrating, remembering, or making decisions?: No Patient able to express need for assistance with ADLs?: Yes Does the patient have difficulty dressing or bathing?: No Independently performs ADLs?: Yes (appropriate for developmental age) Does the patient have difficulty walking or climbing stairs?: No Weakness of Legs: None Weakness of Arms/Hands: None  Permission Sought/Granted                  Emotional Assessment   Attitude/Demeanor/Rapport: Engaged Affect (typically observed): Accepting Orientation: : Oriented to Self,Oriented to Place,Oriented to  Time,Oriented to Situation Alcohol / Substance Use: Not Applicable Psych Involvement: No (comment)  Admission diagnosis:  PNA (pneumonia) [J18.9] Acute hypoxemic respiratory failure (HCC) [J96.01] Sepsis, due to unspecified organism, unspecified whether acute organ dysfunction present [facility]) [A41.9] Patient Active Problem List   Diagnosis Date Noted  . Abnormal CT scan, esophagus   . Severe sepsis (HCC) 08/05/2020  . Acute hypoxemic respiratory failure (HCC) 07/13/2020  . Pneumonia 06/24/2019  . Pneumonitis 06/24/2019  . Aspiration pneumonitis (HCC) 06/10/2019  . Closed fracture of xiphoid process   . PNA (pneumonia) 05/15/2019  . Multifocal pneumonia 03/11/2019  . Acute respiratory failure with hypoxemia (HCC) 03/11/2019  . Sinus tachycardia 03/11/2019  . Sepsis due to pneumonia (HCC) 03/11/2019  .  Hypothyroidism 03/11/2019  . Bronchopneumonia 11/06/2018  . Acute Respiratory failure with hypoxia (2/2 Asp PNA) 11/06/2018  . Recurrent aspiration pneumonia (HCC)   . Fever 06/09/2018  . Tachycardia 06/09/2018  . Hypoxia 06/09/2018  . Hypotension 06/09/2018  . Esophageal motility disorder    . Aspiration pneumonia of right lower lobe (HCC)   . Sepsis (HCC) 05/26/2018  . Pulmonary edema 02/02/2018  . HCAP (healthcare-associated pneumonia) 02/02/2018  . Weakness generalized 02/02/2018  . Hypertension 02/02/2018  . CHF (congestive heart failure) (HCC) 02/02/2018  . Nausea & vomiting 02/02/2018  . Dysphagia 02/02/2018  . Esophageal stricture/stenosis/esophageal dysmotility with recurrent aspiration 02/02/2018  . Esophageal stenosis 02/02/2018  . GERD (gastroesophageal reflux disease) 02/02/2018  . Polysubstance abuse (HCC) 02/02/2018  . Acute respiratory failure with hypoxia (HCC)   . Transaminitis   . Elevated troponin   . Overdose 01/02/2018  . Hx of hepatitis C 01/17/2013  . Depression 01/17/2013  . Anxiety with depression 01/17/2013  . Right knee pain 01/17/2013   PCP:  Jason Coop, FNP Pharmacy:   [facility] [phone] - [place], [place] - [address] [place] [address] Phone: [phone] Fax: [phone]     Social Determinants of Health (SDOH) Interventions    Readmission Risk Interventions Readmission Risk Prevention Plan 08/06/2020 05/17/2019 02/14/2019  Transportation Screening Complete Complete -  PCP or Specialist Appt within 3-5 Days - - Complete  HRI or Home Care Consult Complete - -  Social Work Consult for Recovery Care Planning/Counseling Complete - -  Palliative Care Screening Not Applicable - -  Medication Review (RN Care Manager) Complete Complete -  PCP or Specialist appointment within 3-5 days of discharge - Not Complete -  PCP/Specialist Appt Not Complete comments - patient must make appt. due to previous no shows when schedule by [facility] team -  HRI or Home Care Consult - Not Complete -  HRI or Home Care Consult Pt Refusal Comments - declined -  SW Recovery Care/Counseling Consult - Complete -  Palliative Care Screening - Not Applicable -  Skilled Nursing Facility - Not  Applicable -  Some recent data might be hidden

## 2020-08-06 NOTE — Consult Note (Signed)
@LOGO @   Referring Provider: [facility]  Primary Care Physician:  , FNP Primary Gastroenterologist:  Dr. Jason Coop  Date of Admission:  Date of Consultation:   Reason for Consultation: Aspiration pneumonia, CT showing patulous esophagus, fluid-filled, history of esophageal dysphagia.  HPI:  Christian Ellison is a 59 y.o. year old male with a hx of GERD, esophageal strictures, esophageal dysmotility with manometry not meeting criteria for achalasia in 2019, prior hospitalizations for aspiration pneumonia, polysubstance abuse, and hepatitis C (RNA not detected in July 2020) who presents to the emergency department via EMS with complaints of difficulty breathing and cough for the past 3 days.   ED Course: Febrile to 102.6, initial tachycardia to 140, blood pressure 142 to 146 systolic.  O2 sats 88% on room air, subsequently placed on 3L nasal cannula. Lactic acid 2.9.  WBC- 8.  UA small hemoglobin.  UDS positive for benzos.  Covid test negative.  CTA chest-negative for PE, shows persistent bilateral groundglass airspace opacities with reticular nodular airspace opacities, progressed from prior study.  Right lower lobe new bronchial wall thickening and air plugging.  Differentials atypical infectious process, aspiration.  Also patulous and dilated esophagus that is fluid-filled to the level of the upper thorax.  He was started on antibiotics.  IV fluids given. Hospitalist to admit for evaluation and management and GI consulted.   Most recent GI evaluation of esophageal dysphagia:  - EGD August 2019 with suggested stricture distal esophagus proximal to GE junction s/p dilation with 18 mm balloon but no mucosal disruption and felt to be related to esophageal motility disorder. - Manometry October 2019 with LES pressure elevated with incomplete relaxation felt to be consistent with EG J outflow obstruction but not meeting criteria for achalasia given normal peristalsis. -  BPE October 2020 with obstruction at level of GE junction, some distal fold thickening in the esophagus immediately above the GE junction but no discrete mass identified, esophageal motility disorder with extensive tertiary contractions. Previously treated by Dr. November 2020 with diltiazem. Last seen in office in October 2020.   Today:  Started having increased cough over the last week. States he came to emergency room because his wife made him. Denies difficult breathing but reports increased cough over the last week. Feels somewhat improved since admission. States he is ready to go home.    Over the last year, he feels his dysphagia symptoms have been about the same.  States he does well with drinking liquids.  When he eats larger meals or eats quickly, he notices foods tend to sit in his lower esophagus.  Also notes intermittent regurgitation of frothy substance and rarely foods if he eats quickly, eats large meals, or lays down soon after eating. Usually, regurgitation will occur within 30 minutes.  Typically about once a week.  States symptoms are also worsened by stress.  Denies nausea or vomiting otherwise.  Tries to sit up at least 1 hour after eating, but this does not always happen. Eats dinner around 9 PM.  He is usually very cautious to chew well and take this time.  1 foods to get hung in the lower esophagus, he is typically able to get them to go down if he drinks a bolus of fluid.  Overall, he feels the symptoms are about the same compared to 1 year ago.  He has continued taking diltiazem 120 mg daily.    Denies GERD symptoms.  He does not take anything daily for GERD.  No  abdominal pain, BRBPR, melena, constipation, or diarrhea.  No unintentional weight loss.  Past Medical History:  Diagnosis Date  . Anxiety   . Aspiration pneumonia (HCC) 05/26/2018   RLL  . Chronic pain   . Esophageal stricture   . GERD (gastroesophageal reflux disease)   . 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   Marland Kitchen Opiate abuse, continuous (HCC)   . Sleep apnea    CPAP nightly    Past Surgical History:  Procedure Laterality Date  . BIOPSY  03/18/2018   Procedure: BIOPSY;  Surgeon: Malissa Hippo, MD;  Location: [facility];  Service: Endoscopy;;  gastric   . COLONOSCOPY N/A 11/22/2013   Dr. Karilyn Cota: Normal except for hemorrhoids.  Next colonoscopy 10 years.  . ESOPHAGEAL DILATION N/A 03/18/2018   Procedure: ESOPHAGEAL DILATION;  Surgeon: Malissa Hippo, MD;  Location: [facility];  Service: Endoscopy;  Laterality: N/A;  . ESOPHAGEAL MANOMETRY N/A 06/15/2018   Procedure: ESOPHAGEAL MANOMETRY (EM);  Surgeon: Napoleon Form, MD;  Location: [facility];  Service: Endoscopy;  Laterality: N/A;  . ESOPHAGOGASTRODUODENOSCOPY (EGD) WITH PROPOFOL N/A 03/18/2018   Dr. Karilyn Cota: Benign-appearing mild esophageal stenosis proximal to the GE J, status post dilation.  2 cm hiatal hernia.  Gastritis, biopsies negative for H. pylori  . Fracture Right Leg     Patient has rod/screws in this leg  . ORIF CALCANEOUS FRACTURE Left 07/04/2020   Procedure: OPEN REDUCTION INTERNAL FIXATION (ORIF) LEFT TALUS AND CALCANEOUS FRACTURE, SUBTALAR JOINT ARTHROTOMY WITH REMOVAL OF LOOSE BODIES, TALONAVICULAR JOINT ARTHOTOMY WITH REMOVAL OF LOOSE BODIES;  Surgeon: Terance Hart, MD;  Location: [facility] OR;  Service: Orthopedics;  Laterality: Left;  . Rotor Cuff  2012   Right Shoulder    Prior to Admission medications   Medication Sig Start Date End Date Taking? Authorizing Provider  acetaminophen (TYLENOL) 325 MG tablet Take 2 tablets (650 mg total) by mouth every 6 (six) hours as needed for mild pain (or Fever >/= 101). 05/17/19  Yes Emokpae, Courage, MD  ALPRAZolam Prudy Feeler) 1 MG tablet Take 1 mg by mouth every 6 (six) hours as needed for anxiety.  10/22/19  Yes [provider]  aspirin EC 81 MG tablet Take 1 tablet (81 mg total) by mouth daily with breakfast. Patient taking differently: Take 81 mg by mouth  at bedtime. 03/13/19  Yes Emokpae, Courage, MD  Glucosamine HCl 1000 MG TABS Take 1,000 mg by mouth at bedtime.   Yes [provider]  levothyroxine (SYNTHROID, LEVOTHROID) 25 MCG tablet Take 25 mcg by mouth daily. 05/13/18  Yes [provider]  Multiple Vitamin (MULTIVITAMIN WITH MINERALS) TABS tablet Take 1 tablet by mouth daily. 50+   Yes [provider]  pantoprazole (PROTONIX) 40 MG tablet Take 1 tablet (40 mg total) by mouth 2 (two) times daily. 05/17/19 07/13/20 Yes Shon Hale, MD    Current Facility-Administered Medications  Medication Dose Route Frequency Provider Last Rate Last Admin  . acetaminophen (TYLENOL) tablet 650 mg  650 mg Oral Q6H PRN Emokpae, Ejiroghene E, MD       Or  . acetaminophen (TYLENOL) suppository 650 mg  650 mg Rectal Q6H PRN Emokpae, Ejiroghene E, MD      . ALPRAZolam Prudy Feeler) tablet 1 mg  1 mg Oral Q6H PRN Tat, David, MD      . dextrose 5 %-0.9 % sodium chloride infusion   Intravenous Continuous Emokpae, Ejiroghene E, MD 100 mL/hr at 08/05/20 2344 New Bag  at 08/05/20 2344  . heparin injection 5,000 Units  5,000 Units Subcutaneous Q8H Emokpae, Ejiroghene E, MD   5,000 Units at 08/06/20 0653  . ondansetron (ZOFRAN) tablet 4 mg  4 mg Oral Q6H PRN Emokpae, Ejiroghene E, MD       Or  . ondansetron (ZOFRAN) injection 4 mg  4 mg Intravenous Q6H PRN Emokpae, Ejiroghene E, MD      . pantoprazole (PROTONIX) injection 40 mg  40 mg Intravenous Q24H Emokpae, Ejiroghene E, MD   40 mg at 08/05/20 2345  . piperacillin-tazobactam (ZOSYN) IVPB 3.375 g  3.375 g Intravenous Q8H Emokpae, Ejiroghene E, MD 12.5 mL/hr at 08/06/20 0500 3.375 g at 08/06/20 0500    Allergies as of 08/05/2020  . (No Known Allergies)    Family History  Problem Relation Age of Onset  . Breast cancer Mother   . Colon cancer Father   . Bladder Cancer Father   . Prostate cancer Father   . Hypertension Sister   . Hypothyroidism Sister   . Healthy Sister   . Healthy  Daughter   . Healthy Son   . Healthy Son     Social History   Socioeconomic History  . Marital status: Married    Spouse name: Not on file  . Number of children: Not on file  . Years of education: Not on file  . Highest education level: Not on file  Occupational History  . Occupation: unemployed  Tobacco Use  . Smoking status: Never Smoker  . Smokeless tobacco: Never Used  Vaping Use  . Vaping Use: Never used  Substance and Sexual Activity  . Alcohol use: Not Currently  . Drug use: Not Currently    Types: Benzodiazepines    Comment: opiates  . Sexual activity: Not on file  Other Topics Concern  . Not on file  Social History Narrative  . Not on file   Social Determinants of Health   Financial Resource Strain: Not on file  Food Insecurity: Not on file  Transportation Needs: Not on file  Physical Activity: Not on file  Stress: Not on file  Social Connections: Not on file  Intimate Partner Violence: Not on file    Review of Systems: Gen: See HPI CV: Denies chest pain or palpitations.  Resp: see HPI GI:  See HPI  GU : Denies urinary burning, urinary frequency, urinary incontinence.  Heme: See HPI  Physical Exam: Vital signs in last 24 hours: Temp:  [97.7 F (36.5 C)-102.6 F (39.2 C)] 99.7 F (37.6 C) (12/21 0830) Pulse Rate:  [73-140] 104 (12/21 0830) Resp:  [17-23] 18 (12/21 0830) BP: (104-180)/(60-124) 109/60 (12/21 0830) SpO2:  [88 %-97 %] 93 % (12/21 0830) FiO2 (%):  [0 %] 0 % (12/20 1922) Weight:  [89.1 kg] 89.1 kg (12/20 1849)   General:   Alert,  Well-developed, well-nourished, pleasant and cooperative in NAD Head:  Normocephalic and atraumatic. Eyes:  Sclera clear, no icterus.   Conjunctiva pink. Ears:  Normal auditory acuity. Lungs:  Clear throughout to auscultation.   No wheezes, crackles, or rhonchi. No acute distress. Heart:  Regular rate and rhythm; no murmurs, clicks, rubs,  or gallops. Abdomen:  Soft, nontender and nondistended. No  masses, hepatosplenomegaly or hernias noted. Normal bowel sounds, without guarding, and without rebound.   Rectal:  Deferred  Msk:  Symmetrical without gross deformities. Normal posture. Extremities:  Without edema. Neurologic:  Alert and  oriented x4;  grossly normal neurologically. Skin:  Intact without significant lesions  or rashes. Psych: Normal mood and affect.  Intake/Output from previous day: 12/20 0701 - 12/21 0700 In: 2759.7 [I.V.:936.7; IV Piggyback:1823.1] Out: -  Intake/Output this shift: No intake/output data recorded.  Lab Results: Recent Labs    08/05/20 1351 08/06/20 0552  WBC 8.0 8.1  HGB 13.4 13.1  HCT 41.6 41.6  PLT 198 176   BMET Recent Labs    08/05/20 1351 08/06/20 0552  NA 138 140  K 3.8 3.8  CL 100 100  CO2 28 31  GLUCOSE 120* 120*  BUN 12 11  CREATININE 0.83 0.80  CALCIUM 8.8* 8.7*   LFT Recent Labs    08/05/20 1351  PROT 7.9  ALBUMIN 4.3  AST 19  ALT 14  ALKPHOS 69  BILITOT 0.5   PT/INR Recent Labs    08/05/20 1351  LABPROT 12.9  INR 1.0   Studies/Results: CT Angio Chest PE W/Cm &/Or Wo Cm  Result Date: 08/05/2020 CLINICAL DATA:  Chest pain and shortness of breath. EXAM: CT ANGIOGRAPHY CHEST WITH CONTRAST TECHNIQUE: Multidetector CT imaging of the chest was performed using the standard protocol during bolus administration of intravenous contrast. Multiplanar CT image reconstructions and MIPs were obtained to evaluate the vascular anatomy. CONTRAST:  100mL OMNIPAQUE IOHEXOL 350 MG/ML SOLN COMPARISON:  06/10/2019 FINDINGS: Cardiovascular: Contrast injection is sufficient to demonstrate satisfactory opacification of the pulmonary arteries to the segmental level. There is no pulmonary embolus or evidence of right heart strain. The size of the main pulmonary artery is normal. Heart size is normal, with no pericardial effusion. The course and caliber of the aorta are normal. There is no atherosclerotic calcification. Opacification  decreased due to pulmonary arterial phase contrast bolus timing. Mediastinum/Nodes: --there are mildly enlarged mediastinal lymph nodes. --there are mildly enlarged hilar lymph nodes. For example there is a right hilar lymph node measuring approximately 1.5 cm (axial series 4, image 44). This has increased in size since the prior study. -- No axillary lymphadenopathy. --there are few small but mildly enlarged left supraclavicular lymph nodes that are stable from prior study. --the esophagus is patulous and fluid-filled to the level of the upper thorax. This places the patient at risk for aspiration. -the thyroid gland is unremarkable. Lungs/Pleura: There are innumerable ground-glass airspace opacities in addition to multiple reticulonodular airspace opacities. These are similar in appearance to prior study but have progressed significantly. There is new extensive bronchial wall thickening and mucus plugging involving primarily the right lower lobe and to a lesser degree the left lower lobe. There is some debris within the patient's trachea. There is no pneumothorax or large pleural effusion. Upper Abdomen: Contrast bolus timing is not optimized for evaluation of the abdominal organs. The visualized portions of the organs of the upper abdomen are normal. Musculoskeletal: No chest wall abnormality. No bony spinal canal stenosis. Review of the MIP images confirms the above findings. IMPRESSION: 1. No acute pulmonary embolism. 2. Persistent bilateral ground-glass airspace opacities with reticulonodular airspace opacities as was described in the patient's CT from 2020. However, these have progressed since prior study. In addition, there is new bronchial wall thickening and airway plugging involving the right lower lobe. Findings may be secondary to an atypical infectious process. However, aspiration is also within the differential diagnosis given the patient's dilated esophagus. 3. Patulous, dilated esophagus that is  fluid-filled to the level of the upper thorax. This places the patient at risk for aspiration. Correlation for causes of a dilated esophagus is recommended. 4. Enlarged mediastinal and hilar  lymph nodes, likely reactive. Electronically Signed   By: Katherine Mantle M.D.   On: 08/05/2020 16:26   DG Chest Port 1 View  Result Date: 08/05/2020 CLINICAL DATA:  Questionable sepsis. EXAM: PORTABLE CHEST 1 VIEW COMPARISON:  Chest x-ray 07/13/2020, 04/21/2020. FINDINGS: Mediastinum and hilar structures normal. Cardiomegaly. Persistent bilateral interstitial prominence noted suggesting pneumonitis. Mild right base atelectasis. No pleural effusion or pneumothorax. Degenerative change thoracic spine. IMPRESSION: 1. Cardiomegaly. 2. Persistent bilateral interstitial prominence noted suggesting pneumonitis. Mild right base atelectasis. Electronically Signed   By: Maisie Fus  Register   On: 08/05/2020 14:22    Impression: 59 year old male with history of GERD, esophageal stricture s/p dilation in 2019 with no improvement, esophageal dysmotility with manometry in 2019 revealing increased LES pressure with incomplete relaxation felt to be consistent with EGJ outflow obstruction but not meeting criteria for achalasia in 2019, prescribed diltiazem for dysphagia. Also with prior hospitalizations for aspiration pneumonia and history of polysubstance abuse. Patient presented to the emergency room 08/05/2020 via EMS due to difficulties breathing and cough.  He was febrile in the emergency room with temp of 102.6, tachycardic, and O2 saturation 88% on room air.  CTA with no evidence of PE, persistent bilateral groundglass airspace opacities with reticular nodular airspace opacities, progressed from prior study.  Right lower lobe new bronchial wall thickening and air plugging.  Also with patulous and dilated esophagus that is fluid-filled to the level of the upper thorax.  He was started on IV fluids and IV antibiotics and GI was  consulted for further evaluation of esophageal dilation.  Esophageal dilation: Patulous and dilated esophagus that is fluid-filled to the level of the upper thorax on CTA.  Likely secondary to known esophageal dysphagia with increased LES sphincter tone/incomplete relaxation.  Notably, last BPE in October 2020 with no report of esophageal dilation. Patient reports he has continued taking diltiazem 120 mg at home, and his dysphagia symptoms have been fairly stable over the last year.  He does okay with liquids, but when consuming solid foods, he notices they get stuck in the lower esophagus but often move through if he drinks a bolus of fluid.  Intermittent regurgitation of frothy substance and rarely food which tends to occur if he is eating fast.  Tries to stay upright after eating for at least 1 hour, but this does not always happen as he eats late in the evening.   Patient needs additional evaluation of dilated esophagus with EGD to exclude development of other causes of esophageal outflow obstruction including stricture or malignancy. Moving forward, may need to consider updating manometry to assess for achalasia as he may benefit from Botox therapy or myotomy for correction. I suspect his recurrent aspiration pneumonia is secondary to his esophageal dysphagia and non-compliance with lifestyle adjustments previously recommended, specifically remaining upright for 2-3 hours after meals.   Aspiration Pneumonia: Remains on 3L nasal canula today. Overall states he is feeling improved and is ready to go home. Continue antibiotics. Management per hospitalitis.   Plan: Clear liquids today.  Sit upright for 2-3 hours after meals.  Hold off on restarting diltiazem today in the setting of intermittently soft BP. Likely proceed with EGD +/- dilation with propofol with Dr. Karilyn Cota tomorrow as long as patient remains stable. The risks, benefits, and alternatives have been discussed with the patient in detail. The  patient states understanding and desires to proceed.  NPO at midnight.  Reassess in the am.    LOS: 1 day    08/06/2020,  8:47 AM   Ermalinda Memos, PA-C [facility]

## 2020-08-07 ENCOUNTER — Encounter (HOSPITAL_COMMUNITY)
Admission: EM | Disposition: A | Discharge status: Home / Self Care | Payer: Self-pay | Source: Home / Self Care | Attending: Internal Medicine

## 2020-08-07 ENCOUNTER — Inpatient Hospital Stay (HOSPITAL_COMMUNITY): Payer: 59 | Admitting: Anesthesiology

## 2020-08-07 DIAGNOSIS — K224 Dyskinesia of esophagus: Secondary | ICD-10-CM

## 2020-08-07 DIAGNOSIS — R933 Abnormal findings on diagnostic imaging of other parts of digestive tract: Secondary | ICD-10-CM

## 2020-08-07 HISTORY — PX: ESOPHAGEAL DILATION: SHX303

## 2020-08-07 HISTORY — PX: ESOPHAGOGASTRODUODENOSCOPY (EGD) WITH PROPOFOL: SHX5813

## 2020-08-07 LAB — CBC
HCT: 36.2 % — ABNORMAL LOW (ref 39.0–52.0)
Hemoglobin: 11.2 g/dL — ABNORMAL LOW (ref 13.0–17.0)
MCH: 28.9 pg (ref 26.0–34.0)
MCHC: 30.9 g/dL (ref 30.0–36.0)
MCV: 93.3 fL (ref 80.0–100.0)
Platelets: 166 10*3/uL (ref 150–400)
RBC: 3.88 MIL/uL — ABNORMAL LOW (ref 4.22–5.81)
RDW: 12.4 % (ref 11.5–15.5)
WBC: 5.4 10*3/uL (ref 4.0–10.5)
nRBC: 0 % (ref 0.0–0.2)

## 2020-08-07 LAB — MAGNESIUM: Magnesium: 2.1 mg/dL (ref 1.7–2.4)

## 2020-08-07 LAB — BASIC METABOLIC PANEL
Anion gap: 6 (ref 5–15)
BUN: 8 mg/dL (ref 6–20)
CO2: 30 mmol/L (ref 22–32)
Calcium: 8.6 mg/dL — ABNORMAL LOW (ref 8.9–10.3)
Chloride: 104 mmol/L (ref 98–111)
Creatinine, Ser: 0.64 mg/dL (ref 0.61–1.24)
GFR, Estimated: >60 mL/min (ref >60)
Glucose, Bld: 106 mg/dL — ABNORMAL HIGH (ref 70–99)
Potassium: 3.5 mmol/L (ref 3.5–5.1)
Sodium: 140 mmol/L (ref 135–145)

## 2020-08-07 SURGERY — ESOPHAGOGASTRODUODENOSCOPY (EGD) WITH PROPOFOL
Anesthesia: General

## 2020-08-07 MED ORDER — LACTATED RINGERS IV SOLN: INTRAVENOUS | Status: DC | PRN

## 2020-08-07 MED ORDER — ESMOLOL HCL 100 MG/10ML IV SOLN
INTRAVENOUS | Status: DC | PRN
  Administered 2020-08-07: 30 mg via INTRAVENOUS

## 2020-08-07 MED ORDER — LIDOCAINE HCL (CARDIAC) PF 100 MG/5ML IV SOSY
PREFILLED_SYRINGE | INTRAVENOUS | Status: DC | PRN
  Administered 2020-08-07: 100 mg via INTRAVENOUS

## 2020-08-07 MED ORDER — DILTIAZEM HCL 30 MG PO TABS: 30.0000 mg | ORAL_TABLET | Freq: Three times a day (TID) | ORAL | 5 refills | Status: DC

## 2020-08-07 MED ORDER — DILTIAZEM HCL 30 MG PO TABS
30.0000 mg | ORAL_TABLET | Freq: Three times a day (TID) | ORAL | Status: DC
  Administered 2020-08-07: 18:00:00 30 mg via ORAL
  Filled 2020-08-07 (×2): qty 1

## 2020-08-07 MED ORDER — EPHEDRINE SULFATE 50 MG/ML IJ SOLN
INTRAMUSCULAR | Status: DC | PRN
Start: 2020-08-07 — End: 2020-08-07
  Administered 2020-08-07: 10 mg via INTRAVENOUS

## 2020-08-07 MED ORDER — AMOXICILLIN-POT CLAVULANATE 875-125 MG PO TABS: 1.0000 | ORAL_TABLET | Freq: Two times a day (BID) | ORAL | 0 refills | Status: AC

## 2020-08-07 MED ORDER — PANTOPRAZOLE SODIUM 40 MG PO TBEC: 40.0000 mg | DELAYED_RELEASE_TABLET | Freq: Two times a day (BID) | ORAL | 5 refills | Status: DC

## 2020-08-07 MED ORDER — SUCCINYLCHOLINE CHLORIDE 20 MG/ML IJ SOLN
INTRAMUSCULAR | Status: DC | PRN
  Administered 2020-08-07: 200 mg via INTRAVENOUS

## 2020-08-07 MED ORDER — PROPOFOL 10 MG/ML IV BOLUS
INTRAVENOUS | Status: DC | PRN
  Administered 2020-08-07: 200 mg via INTRAVENOUS

## 2020-08-07 NOTE — Progress Notes (Signed)
Pt tolerating regular diet without difficulty, states swallowing is, "Fine". Took po med without diff. No SOB. Pt aware of pending discharge and agreeable.

## 2020-08-07 NOTE — Discharge Instructions (Signed)
1)Very soft diet advised 2)Please take Augmentin/diltiazem 30 mg 3 times a day and Protonix 40 mg daily 3)Please follow-up with Gastroenterologist Dr. Karilyn Cota as advised

## 2020-08-07 NOTE — Progress Notes (Signed)
Pt back to room from Endo procedure via WC. Alert, oriented. Denies c/o. States he feels much better than before he went down for the procedure. VSS. SaO2 94-95% on room air.

## 2020-08-07 NOTE — Progress Notes (Signed)
Patient states he's doing better today with his breathing. Thinks having his CPAP last night helped a lot versus antibiotics. Denies any dyspnea or chest pain. Does not appear to be in respiratory distress, no labored breathing or tachypnea. Resting comfortably. Room air sats this morning 97%. Lungs clear throughout although slightly diminished at bilateral bases.  Reviewed labs from this morning.  CBC without leukocytosis, hemoglobin acceptable.  BMP with mild hypocalcemia otherwise normal kidney function and electrolytes.  Magnesium normal.  We recommended proceeding with the EGD today and he is in agreement.  We will proceed with scheduling at this point.  Proceed with EGD with Dr. Laural Golden on propofol/MAC today: the risks, benefits, and alternatives have been discussed with the patient in detail. The patient states understanding and desires to proceed.  Home meds: Xanax. The patient is not on any other anxiolytics, chronic pain medications, antidepressants, antidiabetics, or iron supplements. Heparin on hold/not given this morning.   Thank you for allowing [place] to participate in the care of [place]  Walden Field, DNP, AGNP-C Adult & Gerontological Nurse Practitioner [facility]

## 2020-08-07 NOTE — Progress Notes (Signed)
Pt discharged ambulatory to POV with all personal belongings in his possession. °

## 2020-08-07 NOTE — Op Note (Signed)
[facility] Patient Name: Christian Ellison Procedure Date: 08/07/2020 2:05 PM MRN: [id number] Date of Birth: Dec 22, 1960 Attending MD: Lionel December , MD CSN: [id number] Age: 59 Admit Type: Inpatient Procedure:                Upper GI endoscopy Indications:              Abnormal CT of the GI tract Providers:                Lionel December, MD, Crystal Page, Edythe Clarity,                            Technician Referring MD:              Medicines:                Propofol per Anesthesia Complications:            No immediate complications. Estimated Blood Loss:     Estimated blood loss: none. Procedure:                Pre-Anesthesia Assessment:                           - Prior to the procedure, a History and Physical                            was performed, and patient medications and                            allergies were reviewed. The patient's tolerance of                            previous anesthesia was also reviewed. The risks                            and benefits of the procedure and the sedation                            options and risks were discussed with the patient.                            All questions were answered, and informed consent                            was obtained. Prior Anticoagulants: The patient                            last took heparin 1 day prior to the procedure. ASA                            Grade Assessment: III - A patient with severe                            systemic disease. After reviewing the risks and  benefits, the patient was deemed in satisfactory                            condition to undergo the procedure.                           After obtaining informed consent, the endoscope was                            passed under direct vision. Throughout the                            procedure, the patient's blood pressure, pulse, and                            oxygen saturations were monitored  continuously. The                            GIF-H190 ([id number] scope was introduced through the                            mouth, and advanced to the second part of duodenum.                            The upper GI endoscopy was accomplished without                            difficulty. The patient tolerated the procedure                            well. Scope In: 2:15:06 PM Scope Out: 2:27:10 PM Total Procedure Duration: 0 hours 12 minutes 4 seconds  Findings:      The hypopharynx was normal.      Fluid was found in the distal esophagus. About 5 mm      Abnormal motility was noted in the distal esophagus. The cricopharyngeus       was abnormal. There is spasticity of the esophageal body. The distal       esophagus/lower esophageal sphincter is spastic, but gives up passage to       the endoscope. A TTS dilator was passed through the scope. Dilation with       an 18-19-20 mm balloon dilator was performed to 18 mm, 19 mm and 20 mm.       The dilation site was examined and showed no change, no mucosal tear and       no perforation.      The Z-line was regular and was found 44 cm from the incisors.      The entire examined stomach was normal.      The duodenal bulb and second portion of the duodenum were normal. Impression:               - Normal hypopharynx.                           - Small amount of fluid in the distal esophagus.                           -  Abnormal esophageal motility, consistent with                            esophageal spasm at distal esophagus proximal to GE                            junction.. Dilated.                           - Z-line regular, 44 cm from the incisors.                           - Normal stomach.                           - Normal duodenal bulb and second portion of the                            duodenum.                           - No specimens collected. Moderate Sedation:      Per Anesthesia Care Recommendation:           - Return  patient to hospital ward for ongoing care.                           - Mechanical soft diet today.                           - Continue present medications.                           - Resume diltiazem at a dose of 30 mg p.o. AC.-                           - Arrange for esophageal manometry on an outpatient                            basis. Procedure Code(s):        --- Professional ---                           [phone], Esophagogastroduodenoscopy, flexible,                            transoral; with transendoscopic balloon dilation of                            esophagus (less than 30 mm diameter) Diagnosis Code(s):        --- Professional ---                           K22.4, Dyskinesia of esophagus                           R93.3, Abnormal findings on diagnostic imaging of  other parts of digestive tract CPT copyright 2019 American Medical Association. All rights reserved. The codes documented in this report are preliminary and upon coder review may  be revised to meet current compliance requirements. Lionel December, MD Lionel December, MD 08/07/2020 2:38:12 PM This report has been signed electronically. Number of Addenda: 0

## 2020-08-07 NOTE — Progress Notes (Signed)
Pt down to pre-op for upper endoscopy via WC by pre-op staff.

## 2020-08-07 NOTE — Discharge Summary (Signed)
Christian Ellison, is a 59 y.o. male  DOB 1960-12-20  MRN [id number].  Admission date:  08/05/2020  Admitting Physician  Onnie BoerEjiroghene E EmokpRaelyn Ensignae, MD  Discharge Date:  08/07/2020   Primary MD  Jason CoopNicholson, Sterling J IV, FNP  Recommendations for primary care physician for things to follow:   1)Very soft diet advised 2)Please take Augmentin/diltiazem 30 mg 3 times a day and Protonix 40 mg daily 3)Please follow-up with Gastroenterologist Dr. Karilyn Cotaehman as advised  Admission Diagnosis  PNA (pneumonia) [J18.9] Acute hypoxemic respiratory failure (HCC) [J96.01] Sepsis, due to unspecified organism, unspecified whether acute organ dysfunction present [facility](HCC) [A41.9]   Discharge Diagnosis  PNA (pneumonia) [J18.9] Acute hypoxemic respiratory failure (HCC) [J96.01] Sepsis, due to unspecified organism, unspecified whether acute organ dysfunction present (HCC) [A41.9]    Principal Problem:   Severe sepsis (HCC) Active Problems:   Acute Respiratory failure with hypoxia (2/2 Asp PNA)   Hx of hepatitis C   Anxiety with depression   Acute respiratory failure with hypoxia (HCC)   Hypertension   CHF (congestive heart failure) (HCC)   Esophageal stricture/stenosis/esophageal dysmotility with recurrent aspiration   Esophageal stenosis   Esophageal motility disorder   Recurrent aspiration pneumonia (HCC)   Hypothyroidism   PNA (pneumonia)   Abnormal CT scan, esophagus      Past Medical History:  Diagnosis Date  . Anxiety   . Aspiration pneumonia (HCC) 05/26/2018   RLL  . Chronic pain   . Esophageal stricture   . GERD (gastroesophageal reflux disease)   . 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   Marland Kitchen. Opiate abuse, continuous (HCC)   . Sleep apnea    CPAP nightly    Past Surgical History:  Procedure Laterality Date  . BIOPSY  03/18/2018   Procedure: BIOPSY;  Surgeon: Malissa Hippoehman, Najeeb U, MD;  Location: [facility]  ENDO SUITE;  Service: Endoscopy;;  gastric   . COLONOSCOPY N/A 11/22/2013   Dr. Karilyn Cotaehman: Normal except for hemorrhoids.  Next colonoscopy 10 years.  . ESOPHAGEAL DILATION N/A 03/18/2018   Procedure: ESOPHAGEAL DILATION;  Surgeon: Malissa Hippoehman, Najeeb U, MD;  Location: [facility] ENDO SUITE;  Service: Endoscopy;  Laterality: N/A;  . ESOPHAGEAL MANOMETRY N/A 06/15/2018   Procedure: ESOPHAGEAL MANOMETRY (EM);  Surgeon: Napoleon FormNandigam, Kavitha V, MD;  Location: [facility] ENDOSCOPY;  Service: Endoscopy;  Laterality: N/A;  . ESOPHAGOGASTRODUODENOSCOPY (EGD) WITH PROPOFOL N/A 03/18/2018   Dr. Karilyn Cotaehman: Benign-appearing mild esophageal stenosis proximal to the GE J, status post dilation.  2 cm hiatal hernia.  Gastritis, biopsies negative for H. pylori  . Fracture Right Leg     Patient has rod/screws in this leg  . ORIF CALCANEOUS FRACTURE Left 07/04/2020   Procedure: OPEN REDUCTION INTERNAL FIXATION (ORIF) LEFT TALUS AND CALCANEOUS FRACTURE, SUBTALAR JOINT ARTHROTOMY WITH REMOVAL OF LOOSE BODIES, TALONAVICULAR JOINT ARTHOTOMY WITH REMOVAL OF LOOSE BODIES;  Surgeon: Terance HartAdair, Christopher R, MD;  Location: [facility] OR;  Service: Orthopedics;  Laterality: Left;  . Rotor Cuff  2012   Right Shoulder  HPI  from the history and physical done on the day of admission:     Chief Complaint: Difficulty breathing  HPI: Christian Ellison is a 59 y.o. male with medical history significant for esophageal strictures, hepatitis C, CHF, hypertension. Patient presented to the ED with complaints of difficulty breathing, cough of 3 days duration.  Multiple hospitalizations over the past few years for similar issue of aspiration pneumonia due to esophageal issues that patient has had. Patient reports episodes of regurgitation over the past few days.  A few episodes of coughing while eating.  Reports after eating he sits up for several hours before sleeping.  He is unaware of aspiration episodes during his sleep. No chest pain, recent right ankle surgery  with mild swelling otherwise no significant lower extremity swelling.  Last hospitalization 12/27-12/29 for aspiration pneumonia treated with IV vancomycin and cefepime, discharged on Augmentin. Admission 3/14-3/15 for same.  ED Course: Febrile to 102.6, initial tachycardia to 140, blood pressure 142 to 146 systolic.  O2 sats 88% on room air currently on 3 L nasal cannula.  Lactic acid 2.9.  WBC- 8.  UA small hemoglobin.  UDS positive for benzos.  Covid test negative. CTA chest-negative for PE, shows persistent bilateral groundglass airspace opacities with reticular nodular airspace opacities, progressed from prior study.  Right lower lobe new bronchial wall thickening and air plugging.  Differentials atypical infectious process, aspiration.  Also patulous and dilated esophagus that is fluid-filled to the level of the upper thorax. IV vancomycin and cefepime started.  bolus given. Hospitalist to admit for evaluation and management.  Review of Systems: As per HPI all other systems reviewed and negative    Hospital Course:     A/p 1)Esophageal dysmotility/stenosis/ stricture- CT on admission  showing patulous dilated esophagus fluid-filled to the level of upper thorax. -GI consult appreciated EGD on 08/07/20 with Small amount of fluid in the distal esophagus., Abnormal esophageal motility, consistent with esophageal spasm at distal esophagus proximal to GE junction.. Dilated.  Z-line regular, 44 cm from the incisors.Normal stomach., Normal duodenal bulb and second portion of the duodenum. -PPI as an outpatient follow-up with GI physician as advised  2) sepsis secondary to recurrent aspiration pneumonia--- sepsis pathophysiology resolved with IV fluids and IV Zosyn -Okay to discharge home on p.o. Augmentin  3) acute hypoxic respiratory failure--secondary to #2 above, resolved with treatment of #2 above  4)HTN/hypothyroidism--- stable, resume PTA meds upon discharge                      Discharge Condition: stable  Follow UP   Follow-up Information    Malissa Hippo, MD. Schedule an appointment as soon as possible for a visit in 6 week(s).   Specialty: Gastroenterology Contact information: [address], [address] [place] [address] [phone]                Consults obtained - Gi  Diet and Activity recommendation:  As advised  Discharge Instructions     Discharge Instructions    Call MD for:  difficulty breathing, headache or visual disturbances   Complete by: As directed    Call MD for:  persistant dizziness or light-headedness   Complete by: As directed    Call MD for:  persistant nausea and vomiting   Complete by: As directed    Call MD for:  severe uncontrolled pain   Complete by: As directed    Call MD for:  temperature >100.4  Complete by: As directed    Diet - low sodium heart healthy   Complete by: As directed    Very soft diet advised   Discharge instructions   Complete by: As directed    1)Very soft diet advised 2)Please take Augmentin/diltiazem 30 mg  3 times a day and Protonix 40 mg daily 3)Please follow-up with Gastroenterologist Dr. Karilyn Cota as advised   Increase activity slowly   Complete by: As directed         Discharge Medications     Allergies as of 08/07/2020   No Known Allergies     Medication List    TAKE these medications   acetaminophen 325 MG tablet Commonly known as: TYLENOL Take 2 tablets (650 mg total) by mouth every 6 (six) hours as needed for mild pain (or Fever >/= 101).   ALPRAZolam 1 MG tablet Commonly known as: XANAX Take 1 mg by mouth every 6 (six) hours as needed for anxiety.   amoxicillin-clavulanate 875-125 MG tablet Commonly known as: Augmentin Take 1 tablet by mouth 2 (two) times daily for 5 days.   aspirin EC 81 MG tablet Take 1 tablet (81 mg total) by mouth daily with breakfast. What changed: when to take this   diltiazem 30 MG tablet Commonly known as: CARDIZEM Take 1  tablet (30 mg total) by mouth 3 (three) times daily.   Glucosamine HCl 1000 MG Tabs Take 1,000 mg by mouth at bedtime.   levothyroxine 25 MCG tablet Commonly known as: SYNTHROID Take 25 mcg by mouth daily.   multivitamin with minerals Tabs tablet Take 1 tablet by mouth daily. 50+   pantoprazole 40 MG tablet Commonly known as: Protonix Take 1 tablet (40 mg total) by mouth 2 (two) times daily.       Major procedures and Radiology Reports - PLEASE review detailed and final reports for all details, in brief -    CT Angio Chest PE W/Cm &/Or Wo Cm  Result Date: 08/05/2020 CLINICAL DATA:  Chest pain and shortness of breath. EXAM: CT ANGIOGRAPHY CHEST WITH CONTRAST TECHNIQUE: Multidetector CT imaging of the chest was performed using the standard protocol during bolus administration of intravenous contrast. Multiplanar CT image reconstructions and MIPs were obtained to evaluate the vascular anatomy. CONTRAST:  OMNIPAQUE IOHEXOL 350 MG/ML SOLN COMPARISON:  06/10/2019 FINDINGS: Cardiovascular: Contrast injection is sufficient to demonstrate satisfactory opacification of the pulmonary arteries to the segmental level. There is no pulmonary embolus or evidence of right heart strain. The size of the main pulmonary artery is normal. Heart size is normal, with no pericardial effusion. The course and caliber of the aorta are normal. There is no atherosclerotic calcification. Opacification decreased due to pulmonary arterial phase contrast bolus timing. Mediastinum/Nodes: --there are mildly enlarged mediastinal lymph nodes. --there are mildly enlarged hilar lymph nodes. For example there is a right hilar lymph node measuring approximately 1.5 cm (axial series 4, image 44). This has increased in size since the prior study. -- No axillary lymphadenopathy. --there are few small but mildly enlarged left supraclavicular lymph nodes that are stable from prior study. --the esophagus is patulous and fluid-filled  to the level of the upper thorax. This places the patient at risk for aspiration. -the thyroid gland is unremarkable. Lungs/Pleura: There are innumerable ground-glass airspace opacities in addition to multiple reticulonodular airspace opacities. These are similar in appearance to prior study but have progressed significantly. There is new extensive bronchial wall thickening and mucus plugging involving primarily the right lower lobe  and to a lesser degree the left lower lobe. There is some debris within the patient's trachea. There is no pneumothorax or large pleural effusion. Upper Abdomen: Contrast bolus timing is not optimized for evaluation of the abdominal organs. The visualized portions of the organs of the upper abdomen are normal. Musculoskeletal: No chest wall abnormality. No bony spinal canal stenosis. Review of the MIP images confirms the above findings. IMPRESSION: 1. No acute pulmonary embolism. 2. Persistent bilateral ground-glass airspace opacities with reticulonodular airspace opacities as was described in the patient's CT from 2020. However, these have progressed since prior study. In addition, there is new bronchial wall thickening and airway plugging involving the right lower lobe. Findings may be secondary to an atypical infectious process. However, aspiration is also within the differential diagnosis given the patient's dilated esophagus. 3. Patulous, dilated esophagus that is fluid-filled to the level of the upper thorax. This places the patient at risk for aspiration. Correlation for causes of a dilated esophagus is recommended. 4. Enlarged mediastinal and hilar lymph nodes, likely reactive. Electronically Signed   By: Katherine Mantle M.D.   On: 08/05/2020 16:26   DG Chest Port 1 View  Result Date: 08/05/2020 CLINICAL DATA:  Questionable sepsis. EXAM: PORTABLE CHEST 1 VIEW COMPARISON:  Chest x-ray 07/13/2020, 04/21/2020. FINDINGS: Mediastinum and hilar structures normal. Cardiomegaly.  Persistent bilateral interstitial prominence noted suggesting pneumonitis. Mild right base atelectasis. No pleural effusion or pneumothorax. Degenerative change thoracic spine. IMPRESSION: 1. Cardiomegaly. 2. Persistent bilateral interstitial prominence noted suggesting pneumonitis. Mild right base atelectasis. Electronically Signed   By: Maisie Fus  Register   On: 08/05/2020 14:22   DG Chest Portable 1 View  Result Date: 07/13/2020 CLINICAL DATA:  Shortness of breath and chest pain. EXAM: PORTABLE CHEST 1 VIEW COMPARISON:  06/12/2020. FINDINGS: Normal heart size. No pleural effusion. Increased reticular interstitial opacities are identified with a lower lung zone predominance. Superimposed nodular densities are identified within both lower lobes, increased from previous exam. IMPRESSION: Increase lower lung zone reticular and nodular interstitial opacities are concerning for either aspiration or multifocal pneumonia. Followup PA and lateral chest X-ray is recommended in 3-4 weeks following trial of antibiotic therapy to ensure resolution and exclude underlying malignancy. Electronically Signed   By: Signa Kell M.D.   On: 07/13/2020 09:53    Micro Results   Recent Results (from the past 240 hour(s))  Blood Culture (routine x 2)     Status: None (Preliminary result)   Collection Time: 08/05/20  1:46 PM   Specimen: BLOOD  Result Value Ref Range Status   Specimen Description BLOOD RIGHT ANTECUBITAL DRAWN BY RN [facility]  Final   Special Requests   Final    BOTTLES DRAWN AEROBIC AND ANAEROBIC Blood Culture results may not be optimal due to an inadequate volume of blood received in culture bottles   Culture   Final    NO GROWTH 2 DAYS Performed at [facility], [address] [place], [place] [address]    Report Status PENDING  Incomplete  Urine culture     Status: None   Collection Time: 08/05/20  1:51 PM   Specimen: In/Out Cath Urine  Result Value Ref Range Status   Specimen Description   Final     IN/OUT CATH URINE Performed at [facility], [address] [place], [place] [address]    Special Requests   Final    NONE Performed at [facility], [address] [place], [place] [address]    Culture   Final  NO GROWTH Performed at [facility], [address], [place], [place] [address]    Report Status 08/06/2020 FINAL  Final  Blood Culture (routine x 2)     Status: None (Preliminary result)   Collection Time: 08/05/20  1:57 PM   Specimen: BLOOD RIGHT ARM  Result Value Ref Range Status   Specimen Description BLOOD RIGHT ARM DRAWN BY RN [facility]  Final   Special Requests   Final    BOTTLES DRAWN AEROBIC AND ANAEROBIC Blood Culture adequate volume   Culture   Final    NO GROWTH 2 DAYS Performed at [facility], [address], [place], [place] [address]    Report Status PENDING  Incomplete  Resp Panel by RT-PCR (Flu A&B, Covid) Nasopharyngeal Swab     Status: None   Collection Time: 08/05/20  2:00 PM   Specimen: Nasopharyngeal Swab; Nasopharyngeal(NP) swabs in vial transport medium  Result Value Ref Range Status   SARS Coronavirus 2 by RT PCR NEGATIVE NEGATIVE Final    Comment: (NOTE) SARS-CoV-2 target nucleic acids are NOT DETECTED.  The SARS-CoV-2 RNA is generally detectable in upper respiratory specimens during the acute phase of infection. The lowest concentration of SARS-CoV-2 viral copies this assay can detect is 138 copies/mL. A negative result does not preclude SARS-Cov-2 infection and should not be used as the sole basis for treatment or other patient management decisions. A negative result may occur with  improper specimen collection/handling, submission of specimen other than nasopharyngeal swab, presence of viral mutation(s) within the areas targeted by this assay, and inadequate number of viral copies(<138 copies/mL). A negative result must be combined with clinical observations, patient history, and epidemiological information. The expected result is  Negative.  Fact Sheet for Patients:  BloggerCourse.com  Fact Sheet for Healthcare Providers:  SeriousBroker.it  This test is no t yet approved or cleared by the [place] FDA and  has been authorized for detection and/or diagnosis of SARS-CoV-2 by FDA under an Emergency Use Authorization (EUA). This EUA will remain  in effect (meaning this test can be used) for the duration of the COVID-19 declaration under Section 564(b)(1) of the Act, 21 U.S.C.section 360bbb-3(b)(1), unless the authorization is terminated  or revoked sooner.       Influenza A by PCR NEGATIVE NEGATIVE Final   Influenza B by PCR NEGATIVE NEGATIVE Final    Comment: (NOTE) The Xpert Xpress SARS-CoV-2/FLU/RSV plus assay is intended as an aid in the diagnosis of influenza from Nasopharyngeal swab specimens and should not be used as a sole basis for treatment. Nasal washings and aspirates are unacceptable for Xpert Xpress SARS-CoV-2/FLU/RSV testing.  Fact Sheet for Patients: BloggerCourse.com  Fact Sheet for Healthcare Providers: SeriousBroker.it  This test is not yet approved or cleared by the [place] FDA and has been authorized for detection and/or diagnosis of SARS-CoV-2 by FDA under an Emergency Use Authorization (EUA). This EUA will remain in effect (meaning this test can be used) for the duration of the COVID-19 declaration under Section 564(b)(1) of the Act, 21 U.S.C. section 360bbb-3(b)(1), unless the authorization is terminated or revoked.  Performed at [facility], [address] [place], [place] [address]        Today   Subjective    Christian Ellison today has no new complaints No fever  Or chills   No Nausea, Vomiting or Diarrhea        Patient has been seen and examined prior to discharge   Objective   Blood  pressure 131/76, pulse 75, temperature 98.8 F (37.1 C), resp.  rate 16, height 6' (1.829 m), weight 89.1 kg, SpO2 94 %.   Intake/Output Summary (Last 24 hours) at 08/07/2020 1732 Last data filed at 08/07/2020 1431 Gross per 24 hour  Intake 300 ml  Output --  Net 300 ml    Exam Gen:- Awake Alert, no acute distress  HEENT:- [place].AT, No sclera icterus Neck-Supple Neck,No JVD,.  Lungs-improved air movement, no wheezing CV- S1, S2 normal, regular Abd-  +ve B.Sounds, Abd Soft, No tenderness,    Extremity/Skin:- No  edema,   good pulses Psych-affect is appropriate, oriented x3 Neuro-no new focal deficits, no tremors    Data Review   CBC w Diff:  Lab Results  Component Value Date   WBC 5.4 08/07/2020   HGB 11.2 (L) 08/07/2020   HCT 36.2 (L) 08/07/2020   PLT 166 08/07/2020   LYMPHOPCT 7 08/05/2020   MONOPCT 5 08/05/2020   EOSPCT 2 08/05/2020   BASOPCT 0 08/05/2020    CMP:  Lab Results  Component Value Date   NA 140 08/07/2020   K 3.5 08/07/2020   CL 104 08/07/2020   CO2 30 08/07/2020   BUN 8 08/07/2020   CREATININE 0.64 08/07/2020   PROT 7.9 08/05/2020   ALBUMIN 4.3 08/05/2020   BILITOT 0.5 08/05/2020   ALKPHOS 69 08/05/2020   AST 19 08/05/2020   ALT 14 08/05/2020  .   Total Discharge time is about 33 minutes  Shon Hale M.D on 08/07/2020 at 5:32 PM  Go to [email or web address] -  for contact info  [facility] - Office  [phone]

## 2020-08-07 NOTE — Anesthesia Preprocedure Evaluation (Signed)
Anesthesia Evaluation  Patient identified by MRN, date of birth, ID band Patient awake    Reviewed: Allergy & Precautions, NPO status , Patient's Chart, lab work & pertinent test results  Airway Mallampati: II  TM Distance: >3 FB Neck ROM: Full    Dental  (+) Dental Advisory Given, Missing, Poor Dentition   Pulmonary sleep apnea and Continuous Positive Airway Pressure Ventilation , pneumonia (recurrent aspiration pneumonia),    breath sounds clear to auscultation       Cardiovascular Exercise Tolerance: Poor hypertension, Pt. on medications +CHF  Normal cardiovascular exam Rhythm:Regular Rate:Normal     Neuro/Psych PSYCHIATRIC DISORDERS Anxiety Depression    GI/Hepatic GERD  Medicated and Poorly Controlled,(+)     substance abuse  , Hepatitis -, C  Endo/Other  Hypothyroidism   Renal/GU      Musculoskeletal  (+) Arthritis  (right knee pain), narcotic dependent  Abdominal   Peds  Hematology negative hematology ROS (+)   Anesthesia Other Findings   Reproductive/Obstetrics negative OB ROS                            Anesthesia Physical Anesthesia Plan  ASA: III  Anesthesia Plan: General   Post-op Pain Management:    Induction: Intravenous, Rapid sequence and Cricoid pressure planned  PONV Risk Score and Plan: Ondansetron  Airway Management Planned: Oral ETT  Additional Equipment:   Intra-op Plan:   Post-operative Plan: Extubation in OR  Informed Consent: I have reviewed the patients History and Physical, chart, labs and discussed the procedure including the risks, benefits and alternatives for the proposed anesthesia with the patient or authorized representative who has indicated his/her understanding and acceptance.     Dental advisory given  Plan Discussed with: CRNA and Surgeon  Anesthesia Plan Comments:         Anesthesia Quick Evaluation

## 2020-08-07 NOTE — Anesthesia Procedure Notes (Signed)
Procedure Name: Intubation Performed by: Brynda Peon, CRNA Pre-anesthesia Checklist: Patient identified, Emergency Drugs available, Suction available, Patient being monitored and Timeout performed Patient Re-evaluated:Patient Re-evaluated prior to induction Oxygen Delivery Method: Circle system utilized Preoxygenation: Pre-oxygenation with 100% oxygen Induction Type: IV induction Laryngoscope Size: Miller and 3 Grade View: Grade III Tube size: 7.5 mm Number of attempts: 1 Airway Equipment and Method: Stylet Placement Confirmation: ETT inserted through vocal cords under direct vision,  positive ETCO2,  CO2 detector and breath sounds checked- equal and bilateral Secured at: 23 cm Tube secured with: Tape Dental Injury: Teeth and Oropharynx as per pre-operative assessment

## 2020-08-07 NOTE — Transfer of Care (Signed)
Immediate Anesthesia Transfer of Care Note  Patient: Christian Ellison  Procedure(s) Performed: ESOPHAGOGASTRODUODENOSCOPY (EGD) WITH PROPOFOL (N/A ) ESOPHAGEAL DILATION (N/A )  Patient Location: PACU  Anesthesia Type:General  Level of Consciousness: awake, alert , oriented and patient cooperative  Airway & Oxygen Therapy: Patient Spontanous Breathing  Post-op Assessment: Report given to RN, Post -op Vital signs reviewed and stable and Patient moving all extremities  Post vital signs: Reviewed and stable  Last Vitals:  Vitals Value Taken Time  BP 100/55 08/07/20 1434  Temp    Pulse 82 08/07/20 1434  Resp 14 08/07/20 1434  SpO2 95 % 08/07/20 1434  Vitals shown include unvalidated device data.  Last Pain:  Vitals:   08/07/20 1407  TempSrc:   PainSc: 0-No pain         Complications: No complications documented.

## 2020-08-07 NOTE — Progress Notes (Signed)
Brief EGD note.  Scant amount of fluid noted in distal esophagus. There was no food debris. Esophagus did not appear to be dilated. Spastic segment distal esophagus proximal to GE junction. GE junction at 44 cm from the incisors Normal examination of stomach, first and second part of duodenum. Spastic esophageal segment dilated with balloon dilator from 18 to 20 mm. No mucosal disruption noted.

## 2020-08-07 NOTE — Progress Notes (Signed)
Subjective:  Patient states he feels better.  He has cough but no sputum.  He denies dyspnea at rest or chest pain.  He also denies nausea vomiting or regurgitation.  He uses CPAP.  He states he was able to rest during the night.  He denies abdominal pain  Current Medications:  Current Facility-Administered Medications:  .  acetaminophen (TYLENOL) tablet 650 mg, 650 mg, Oral, Q6H PRN **OR** acetaminophen (TYLENOL) suppository 650 mg, 650 mg, Rectal, Q6H PRN, Emokpae, Ejiroghene E, MD .  ALPRAZolam Duanne Moron) tablet 1 mg, 1 mg, Oral, Q6H PRN, Tat, David, MD, 1 mg at 08/06/20 2257 .  heparin injection 5,000 Units, 5,000 Units, Subcutaneous, Q8H, Emokpae, Ejiroghene E, MD, 5,000 Units at 08/06/20 2229 .  ondansetron (ZOFRAN) tablet 4 mg, 4 mg, Oral, Q6H PRN **OR** ondansetron (ZOFRAN) injection 4 mg, 4 mg, Intravenous, Q6H PRN, Emokpae, Ejiroghene E, MD .  pantoprazole (PROTONIX) injection 40 mg, 40 mg, Intravenous, Q24H, Emokpae, Ejiroghene E, MD, 40 mg at 08/06/20 2039 .  piperacillin-tazobactam (ZOSYN) IVPB 3.375 g, 3.375 g, Intravenous, Q8H, Emokpae, Ejiroghene E, MD, Last Rate: 12.5 mL/hr at 08/07/20 0503, 3.375 g at 08/07/20 0503   Objective: Blood pressure 113/77, pulse 73, temperature 98.2 F (36.8 C), resp. rate 18, height 6' (1.829 m), weight 89.1 kg, SpO2 97 %. Patient is alert and in no acute distress. Conjunctiva is pink. Sclera is nonicteric Oropharyngeal mucosa is normal. No neck masses or thyromegaly noted. Cardiac exam with regular rhythm normal S1 and S2.  Auscultation lungs reveal bibasilar rales. Abdomen is full but soft and nontender with organomegaly or masses. He has trace edema around left ankle  Labs/studies Results:  CBC Latest Ref Rng & Units 08/07/2020 08/06/2020 08/05/2020  WBC 4.0 - 10.5 K/uL 5.4 8.1 8.0  Hemoglobin 13.0 - 17.0 g/dL 11.2(L) 13.1 13.4  Hematocrit 39.0 - 52.0 % 36.2(L) 41.6 41.6  Platelets 150 - 400 K/uL 166 176 198    CMP Latest Ref Rng &  Units 08/07/2020 08/06/2020 08/05/2020  Glucose 70 - 99 mg/dL 106(H) 120(H) 120(H)  BUN 6 - 20 mg/dL '8 11 12  ' Creatinine 0.61 - 1.24 mg/dL 0.64 0.80 0.83  Sodium 135 - 145 mmol/L 140 140 138  Potassium 3.5 - 5.1 mmol/L 3.5 3.8 3.8  Chloride 98 - 111 mmol/L 104 100 100  CO2 22 - 32 mmol/L '30 31 28  ' Calcium 8.9 - 10.3 mg/dL 8.6(L) 8.7(L) 8.8(L)  Total Protein 6.5 - 8.1 g/dL - - 7.9  Total Bilirubin 0.3 - 1.2 mg/dL - - 0.5  Alkaline Phos 38 - 126 U/L - - 69  AST 15 - 41 U/L - - 19  ALT 0 - 44 U/L - - 14    Hepatic Function Latest Ref Rng & Units 08/05/2020 07/14/2020 07/13/2020  Total Protein 6.5 - 8.1 g/dL 7.9 5.9(L) 8.4(H)  Albumin 3.5 - 5.0 g/dL 4.3 3.1(L) 4.5  AST 15 - 41 U/L 19 10(L) 18  ALT 0 - 44 U/L '14 10 14  ' Alk Phosphatase 38 - 126 U/L 69 54 91  Total Bilirubin 0.3 - 1.2 mg/dL 0.5 0.3 0.4  Bilirubin, Direct 0.0 - 0.2 mg/dL - - -     Assessment:  #1.  Dilated esophagus.  Patient has esophageal motility disorder.  He has undergone esophageal dilation in the past and he is on calcium channel blocker.  Surprisingly he does not complain of dysphagia.  Fluid/food filled esophagus on chest CT.   #2.  Aspiration pneumonia.  He is  on Zosyn.  #3.  Mild anemia.  No evidence of GI bleed.  Anemia secondary to acute illness.  #4.  Hypothyroidism.  Patient is on levothyroxine.  #4.  History of anxiety and depression   Plan:  Hold heparin. Proceed with esophagogastroduodenoscopy and esophageal dilation.

## 2020-08-07 NOTE — Anesthesia Postprocedure Evaluation (Signed)
Anesthesia Post Note  Patient: Christian Ellison  Procedure(s) Performed: ESOPHAGOGASTRODUODENOSCOPY (EGD) WITH PROPOFOL (N/A ) ESOPHAGEAL DILATION (N/A )  Patient location during evaluation: PACU Anesthesia Type: General Level of consciousness: awake, oriented, awake and alert and patient cooperative Pain management: pain level controlled Vital Signs Assessment: post-procedure vital signs reviewed and stable Respiratory status: spontaneous breathing, nonlabored ventilation and respiratory function stable Cardiovascular status: blood pressure returned to baseline and stable Postop Assessment: no headache and no backache Anesthetic complications: no   No complications documented.   Last Vitals:  Vitals:   08/07/20 1315 08/07/20 1316  BP:  119/73  Pulse: 70 71  Resp: 17 17  Temp:    SpO2: 92% (!) 87%    Last Pain:  Vitals:   08/07/20 1407  TempSrc:   PainSc: 0-No pain                 Brynda Peon

## 2020-08-07 NOTE — Progress Notes (Signed)
Pt aware of NPO x sips of water with meds for planned EGD today. Pt denies any c/o at present.

## 2020-08-10 LAB — CULTURE, BLOOD (ROUTINE X 2)
Culture: NO GROWTH
Culture: NO GROWTH
Special Requests: ADEQUATE

## 2020-08-14 ENCOUNTER — Telehealth (INDEPENDENT_AMBULATORY_CARE_PROVIDER_SITE_OTHER): Payer: Self-pay | Admitting: Internal Medicine

## 2020-08-14 ENCOUNTER — Encounter (HOSPITAL_COMMUNITY): Payer: Self-pay | Admitting: Internal Medicine

## 2020-08-14 ENCOUNTER — Other Ambulatory Visit (INDEPENDENT_AMBULATORY_CARE_PROVIDER_SITE_OTHER): Payer: Self-pay | Admitting: *Deleted

## 2020-08-14 NOTE — Telephone Encounter (Signed)
Patient 's wife states that they are going to discuss the options and call our office back with the one they would like or fell is best for [place].

## 2020-08-14 NOTE — Telephone Encounter (Signed)
Spouse called stated patient is unable to keep anything down - just had a procedure on 12/22 - needs advice from Dr Karilyn Cota - ph# [phone]

## 2020-08-14 NOTE — Telephone Encounter (Signed)
Dr.Rehman has been sent a message about this. 

## 2020-08-14 NOTE — Telephone Encounter (Signed)
Per Dr.Rehman the patient should be on a full liquid diet. He may take Nitroglycerin 0.4 mg SL three times daily before a meal. #100 no refills. This was called to the patient's pharmacy , [facility] in [place]/ [place]. Patient is to be made aware that he is to be sitting when he takes this medication and not standing. The side effects of this medication are headache , and hypotension.  Dr.Rehman also states that the patient should reschedule his manometry . Another recommendation would be Botox. Also he may see Dr. Clydene Laming to be evaluated for a  Stretta Procedure. This procedure uses low frequency heat to reshape the ring of the muscles in the lower esophagus. This helps restore natural barrier.  Patient was called and made aware.

## 2020-08-14 NOTE — Telephone Encounter (Signed)
He does not need Stretta procedure; he has EGJ outflow obstruction. He may need myotomy or botox.

## 2020-08-16 ENCOUNTER — Encounter (HOSPITAL_COMMUNITY): Payer: Self-pay | Admitting: Emergency Medicine

## 2020-08-16 ENCOUNTER — Emergency Department (HOSPITAL_COMMUNITY): Payer: 59

## 2020-08-16 ENCOUNTER — Inpatient Hospital Stay (HOSPITAL_COMMUNITY)
Admission: EM | Admit: 2020-08-16 | Discharge: 2020-08-17 | DRG: 871 | Disposition: A | Discharge status: Home / Self Care | Payer: 59 | Attending: Family Medicine | Admitting: Family Medicine

## 2020-08-16 ENCOUNTER — Other Ambulatory Visit: Payer: Self-pay

## 2020-08-16 DIAGNOSIS — K222 Esophageal obstruction: Secondary | ICD-10-CM | POA: Diagnosis present

## 2020-08-16 DIAGNOSIS — K219 Gastro-esophageal reflux disease without esophagitis: Secondary | ICD-10-CM | POA: Diagnosis present

## 2020-08-16 DIAGNOSIS — N179 Acute kidney failure, unspecified: Secondary | ICD-10-CM

## 2020-08-16 DIAGNOSIS — G8929 Other chronic pain: Secondary | ICD-10-CM | POA: Diagnosis present

## 2020-08-16 DIAGNOSIS — Z803 Family history of malignant neoplasm of breast: Secondary | ICD-10-CM

## 2020-08-16 DIAGNOSIS — J9601 Acute respiratory failure with hypoxia: Secondary | ICD-10-CM | POA: Diagnosis present

## 2020-08-16 DIAGNOSIS — Z8042 Family history of malignant neoplasm of prostate: Secondary | ICD-10-CM

## 2020-08-16 DIAGNOSIS — G4733 Obstructive sleep apnea (adult) (pediatric): Secondary | ICD-10-CM | POA: Diagnosis present

## 2020-08-16 DIAGNOSIS — R000 Tachycardia, unspecified: Secondary | ICD-10-CM | POA: Diagnosis present

## 2020-08-16 DIAGNOSIS — E039 Hypothyroidism, unspecified: Secondary | ICD-10-CM | POA: Diagnosis present

## 2020-08-16 DIAGNOSIS — Z8249 Family history of ischemic heart disease and other diseases of the circulatory system: Secondary | ICD-10-CM

## 2020-08-16 DIAGNOSIS — J690 Pneumonitis due to inhalation of food and vomit: Secondary | ICD-10-CM | POA: Diagnosis present

## 2020-08-16 DIAGNOSIS — K224 Dyskinesia of esophagus: Secondary | ICD-10-CM | POA: Diagnosis present

## 2020-08-16 DIAGNOSIS — Z20822 Contact with and (suspected) exposure to covid-19: Secondary | ICD-10-CM | POA: Diagnosis present

## 2020-08-16 DIAGNOSIS — Z7982 Long term (current) use of aspirin: Secondary | ICD-10-CM

## 2020-08-16 DIAGNOSIS — Z79890 Hormone replacement therapy (postmenopausal): Secondary | ICD-10-CM

## 2020-08-16 DIAGNOSIS — Z823 Family history of stroke: Secondary | ICD-10-CM

## 2020-08-16 DIAGNOSIS — F1320 Sedative, hypnotic or anxiolytic dependence, uncomplicated: Secondary | ICD-10-CM | POA: Diagnosis present

## 2020-08-16 DIAGNOSIS — F1110 Opioid abuse, uncomplicated: Secondary | ICD-10-CM | POA: Diagnosis present

## 2020-08-16 DIAGNOSIS — Z800 Family history of malignant neoplasm of digestive organs: Secondary | ICD-10-CM

## 2020-08-16 DIAGNOSIS — Z8052 Family history of malignant neoplasm of bladder: Secondary | ICD-10-CM | POA: Diagnosis not present

## 2020-08-16 DIAGNOSIS — R6520 Severe sepsis without septic shock: Secondary | ICD-10-CM | POA: Diagnosis not present

## 2020-08-16 DIAGNOSIS — K2289 Other specified disease of esophagus: Secondary | ICD-10-CM

## 2020-08-16 DIAGNOSIS — R1110 Vomiting, unspecified: Secondary | ICD-10-CM | POA: Diagnosis present

## 2020-08-16 DIAGNOSIS — B182 Chronic viral hepatitis C: Secondary | ICD-10-CM | POA: Diagnosis present

## 2020-08-16 DIAGNOSIS — Z79899 Other long term (current) drug therapy: Secondary | ICD-10-CM | POA: Diagnosis not present

## 2020-08-16 DIAGNOSIS — A419 Sepsis, unspecified organism: Principal | ICD-10-CM | POA: Diagnosis present

## 2020-08-16 DIAGNOSIS — F419 Anxiety disorder, unspecified: Secondary | ICD-10-CM | POA: Diagnosis present

## 2020-08-16 DIAGNOSIS — I10 Essential (primary) hypertension: Secondary | ICD-10-CM | POA: Diagnosis present

## 2020-08-16 LAB — URINALYSIS, ROUTINE W REFLEX MICROSCOPIC
Bacteria, UA: NONE SEEN
Bilirubin Urine: NEGATIVE
Glucose, UA: NEGATIVE mg/dL
Ketones, ur: NEGATIVE mg/dL
Leukocytes,Ua: NEGATIVE
Nitrite: NEGATIVE
Protein, ur: NEGATIVE mg/dL
Specific Gravity, Urine: 1.013 (ref 1.005–1.030)
pH: 6 (ref 5.0–8.0)

## 2020-08-16 LAB — COMPREHENSIVE METABOLIC PANEL
ALT: 11 U/L (ref 0–44)
AST: 19 U/L (ref 15–41)
Albumin: 3.8 g/dL (ref 3.5–5.0)
Alkaline Phosphatase: 63 U/L (ref 38–126)
Anion gap: 10 (ref 5–15)
BUN: 11 mg/dL (ref 6–20)
CO2: 28 mmol/L (ref 22–32)
Calcium: 8.5 mg/dL — ABNORMAL LOW (ref 8.9–10.3)
Chloride: 99 mmol/L (ref 98–111)
Creatinine, Ser: 0.83 mg/dL (ref 0.61–1.24)
GFR, Estimated: >60 mL/min (ref >60)
Glucose, Bld: 116 mg/dL — ABNORMAL HIGH (ref 70–99)
Potassium: 3.9 mmol/L (ref 3.5–5.1)
Sodium: 137 mmol/L (ref 135–145)
Total Bilirubin: 0.4 mg/dL (ref 0.3–1.2)
Total Protein: 7.3 g/dL (ref 6.5–8.1)

## 2020-08-16 LAB — CBC WITH DIFFERENTIAL/PLATELET
Abs Immature Granulocytes: 0.06 10*3/uL (ref 0.00–0.07)
Basophils Absolute: 0 10*3/uL (ref 0.0–0.1)
Basophils Relative: 0 %
Eosinophils Absolute: 0.1 10*3/uL (ref 0.0–0.5)
Eosinophils Relative: 1 %
HCT: 39.1 % (ref 39.0–52.0)
Hemoglobin: 12.3 g/dL — ABNORMAL LOW (ref 13.0–17.0)
Immature Granulocytes: 1 %
Lymphocytes Relative: 8 %
Lymphs Abs: 0.9 10*3/uL (ref 0.7–4.0)
MCH: 28.3 pg (ref 26.0–34.0)
MCHC: 31.5 g/dL (ref 30.0–36.0)
MCV: 90.1 fL (ref 80.0–100.0)
Monocytes Absolute: 0.5 10*3/uL (ref 0.1–1.0)
Monocytes Relative: 5 %
Neutro Abs: 8.8 10*3/uL — ABNORMAL HIGH (ref 1.7–7.7)
Neutrophils Relative %: 85 %
Platelets: 207 10*3/uL (ref 150–400)
RBC: 4.34 MIL/uL (ref 4.22–5.81)
RDW: 12.5 % (ref 11.5–15.5)
WBC: 10.4 10*3/uL (ref 4.0–10.5)
nRBC: 0 % (ref 0.0–0.2)

## 2020-08-16 LAB — LACTIC ACID, PLASMA
Lactic Acid, Venous: 1.3 mmol/L (ref 0.5–1.9)
Lactic Acid, Venous: 1.2 mmol/L (ref 0.5–1.9)

## 2020-08-16 LAB — PROTIME-INR
INR: 1.1 (ref 0.8–1.2)
Prothrombin Time: 13.4 seconds (ref 11.4–15.2)

## 2020-08-16 LAB — RESP PANEL BY RT-PCR (FLU A&B, COVID) ARPGX2
Influenza A by PCR: NEGATIVE
Influenza B by PCR: NEGATIVE
SARS Coronavirus 2 by RT PCR: NEGATIVE

## 2020-08-16 LAB — APTT: aPTT: 31 seconds (ref 24–36)

## 2020-08-16 MED ORDER — LACTATED RINGERS IV BOLUS (SEPSIS)
1000.0000 mL | Freq: Once | INTRAVENOUS | Status: AC
  Administered 2020-08-16: 1000 mL via INTRAVENOUS

## 2020-08-16 MED ORDER — ACETAMINOPHEN 325 MG PO TABS: 650.0000 mg | ORAL_TABLET | Freq: Four times a day (QID) | ORAL | Status: DC | PRN

## 2020-08-16 MED ORDER — ACETAMINOPHEN 325 MG PO TABS
650.0000 mg | ORAL_TABLET | Freq: Once | ORAL | Status: AC
  Administered 2020-08-16: 650 mg via ORAL
  Filled 2020-08-16: qty 2

## 2020-08-16 MED ORDER — SODIUM CHLORIDE 0.9 % IV SOLN
500.0000 mg | INTRAVENOUS | Status: DC
  Administered 2020-08-16: 500 mg via INTRAVENOUS
  Filled 2020-08-16: qty 500

## 2020-08-16 MED ORDER — LACTATED RINGERS IV BOLUS (SEPSIS): 1000.0000 mL | Freq: Once | INTRAVENOUS | Status: DC

## 2020-08-16 MED ORDER — LEVOTHYROXINE SODIUM 25 MCG PO TABS
25.0000 ug | ORAL_TABLET | Freq: Every day | ORAL | Status: DC
  Administered 2020-08-17: 25 ug via ORAL
  Filled 2020-08-16: qty 1

## 2020-08-16 MED ORDER — ENOXAPARIN SODIUM 40 MG/0.4ML ~~LOC~~ SOLN
40.0000 mg | SUBCUTANEOUS | Status: DC
  Administered 2020-08-16: 40 mg via SUBCUTANEOUS
  Filled 2020-08-16: qty 0.4

## 2020-08-16 MED ORDER — SODIUM CHLORIDE 0.9 % IV BOLUS
500.0000 mL | Freq: Once | INTRAVENOUS | Status: AC
  Administered 2020-08-16: 500 mL via INTRAVENOUS

## 2020-08-16 MED ORDER — DILTIAZEM HCL ER COATED BEADS 120 MG PO CP24
120.0000 mg | ORAL_CAPSULE | Freq: Every day | ORAL | Status: DC
  Administered 2020-08-16 – 2020-08-17 (×2): 120 mg via ORAL
  Filled 2020-08-16 (×5): qty 1

## 2020-08-16 MED ORDER — ONDANSETRON HCL 4 MG PO TABS: 4.0000 mg | ORAL_TABLET | Freq: Four times a day (QID) | ORAL | Status: DC | PRN

## 2020-08-16 MED ORDER — FAMOTIDINE 20 MG PO TABS
10.0000 mg | ORAL_TABLET | Freq: Two times a day (BID) | ORAL | Status: DC
  Administered 2020-08-16 – 2020-08-17 (×2): 10 mg via ORAL
  Filled 2020-08-16 (×2): qty 1

## 2020-08-16 MED ORDER — SODIUM CHLORIDE 0.9 % IV SOLN
2.0000 g | INTRAVENOUS | Status: DC
  Administered 2020-08-16 – 2020-08-17 (×2): 2 g via INTRAVENOUS
  Filled 2020-08-16 (×2): qty 20

## 2020-08-16 MED ORDER — ACETAMINOPHEN 650 MG RE SUPP: 650.0000 mg | Freq: Once | RECTAL | Status: DC

## 2020-08-16 MED ORDER — PANTOPRAZOLE SODIUM 40 MG PO TBEC
40.0000 mg | DELAYED_RELEASE_TABLET | Freq: Two times a day (BID) | ORAL | Status: DC
  Administered 2020-08-16 – 2020-08-17 (×2): 40 mg via ORAL
  Filled 2020-08-16 (×2): qty 1

## 2020-08-16 MED ORDER — ONDANSETRON HCL 4 MG/2ML IJ SOLN: 4.0000 mg | Freq: Four times a day (QID) | INTRAMUSCULAR | Status: DC | PRN

## 2020-08-16 MED ORDER — ALPRAZOLAM 1 MG PO TABS
1.0000 mg | ORAL_TABLET | Freq: Four times a day (QID) | ORAL | Status: DC | PRN
  Administered 2020-08-16 – 2020-08-17 (×4): 1 mg via ORAL
  Filled 2020-08-16: qty 1
  Filled 2020-08-16: qty 2
  Filled 2020-08-16 (×2): qty 1
  Filled 2020-08-16: qty 2

## 2020-08-16 MED ORDER — GUAIFENESIN ER 600 MG PO TB12
600.0000 mg | ORAL_TABLET | Freq: Two times a day (BID) | ORAL | Status: DC
  Administered 2020-08-16 – 2020-08-17 (×3): 600 mg via ORAL
  Filled 2020-08-16 (×3): qty 1

## 2020-08-16 MED ORDER — ACETAMINOPHEN 650 MG RE SUPP: 650.0000 mg | Freq: Four times a day (QID) | RECTAL | Status: DC | PRN

## 2020-08-16 MED ORDER — ASPIRIN EC 81 MG PO TBEC
81.0000 mg | DELAYED_RELEASE_TABLET | Freq: Every day | ORAL | Status: DC
  Administered 2020-08-16: 81 mg via ORAL
  Filled 2020-08-16: qty 1

## 2020-08-16 NOTE — H&P (Signed)
History and Physical  Patient Name: Christian Ellison     BTD:[id number]    DOB: May 31, 1961    DOA: 08/16/2020 PCP: Jason Coop, FNP  Patient coming from: Home  Chief Complaint: Rigors, hypoxia      HPI: Christian Ellison is a 59 y.o. M with hx esophageal dysmotlity and recurrent aspiraiton pneuomnia, benzodiasepine dependence, hep C s/p treatment, hypothyroidism, recent left talus/calcaneous fracture and ORIF, OSA on CPAP and anxiety who presents with new dyspnea, rigors and fatigue and hypoxia.  Patient had calcaneous surgery in Nov.  Doing well after that until 2 weeks ago, developed cough, difficulty breathing, admitted for aspiration pneumonia.  GI were consulted due to prominent emesis/gagging symptoms and endoscopy was done that showed abnormal motility, spasticity.  It was dilated.  He was started on diltiazem 30 TID.  After discharge, he took the diltiazem and 5 more days Augmentin.  The diltiazem was poorly tolerated (caused fatigue), although he took it.  Finished the antibiotics.  About 5 days ago, his vomiting, "frothing", "Spitting up" symptoms started with most oral intake, especially liquids. He seemed okay until the 24 hours prior to admission when he started to appear tired to spouse, then his O2 level was down to 90%, and then he started to have rigors, so she brought him to the ER.   In the ER, CXR clear, but patient febrile, tachycardic, and hypoxic to 86% on room air.  Started on O2, antibiotics and IV fluids.             ROS: Review of Systems  Constitutional: Positive for chills, fever and malaise/fatigue.  Respiratory: Positive for shortness of breath. Negative for cough, hemoptysis, sputum production and wheezing.   Cardiovascular: Negative for chest pain.  Gastrointestinal: Positive for heartburn, nausea and vomiting. Negative for abdominal pain, blood in stool, constipation, diarrhea and melena.  All other systems reviewed and are  negative.         Past Medical History:  Diagnosis Date  . Anxiety   . Aspiration pneumonia (HCC) 05/26/2018   RLL  . Chronic pain   . Esophageal stricture   . GERD (gastroesophageal reflux disease)   . 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   Marland Kitchen Opiate abuse, continuous (HCC)   . Sleep apnea    CPAP nightly    Past Surgical History:  Procedure Laterality Date  . BIOPSY  03/18/2018   Procedure: BIOPSY;  Surgeon: Malissa Hippo, MD;  Location: [facility];  Service: Endoscopy;;  gastric   . COLONOSCOPY N/A 11/22/2013   Dr. Karilyn Cota: Normal except for hemorrhoids.  Next colonoscopy 10 years.  . ESOPHAGEAL DILATION N/A 03/18/2018   Procedure: ESOPHAGEAL DILATION;  Surgeon: Malissa Hippo, MD;  Location: [facility];  Service: Endoscopy;  Laterality: N/A;  . ESOPHAGEAL DILATION N/A 08/07/2020   Procedure: ESOPHAGEAL DILATION;  Surgeon: Malissa Hippo, MD;  Location: [facility];  Service: Endoscopy;  Laterality: N/A;  . ESOPHAGEAL MANOMETRY N/A 06/15/2018   Procedure: ESOPHAGEAL MANOMETRY (EM);  Surgeon: Napoleon Form, MD;  Location: [facility];  Service: Endoscopy;  Laterality: N/A;  . ESOPHAGOGASTRODUODENOSCOPY (EGD) WITH PROPOFOL N/A 03/18/2018   Dr. Karilyn Cota: Benign-appearing mild esophageal stenosis proximal to the GE J, status post dilation.  2 cm hiatal hernia.  Gastritis, biopsies negative for H. pylori  . ESOPHAGOGASTRODUODENOSCOPY (EGD) WITH PROPOFOL N/A 08/07/2020   Procedure: ESOPHAGOGASTRODUODENOSCOPY (EGD) WITH PROPOFOL;  Surgeon: Malissa Hippo, MD;  Location: [facility];  Service: Endoscopy;  Laterality: N/A;  . Fracture Right Leg     Patient has rod/screws in this leg  . ORIF CALCANEOUS FRACTURE Left 07/04/2020   Procedure: OPEN REDUCTION INTERNAL FIXATION (ORIF) LEFT TALUS AND CALCANEOUS FRACTURE, SUBTALAR JOINT ARTHROTOMY WITH REMOVAL OF LOOSE BODIES, TALONAVICULAR JOINT ARTHOTOMY WITH REMOVAL OF LOOSE BODIES;  Surgeon: Terance Hart, MD;  Location: [facility];  Service: Orthopedics;  Laterality: Left;  . Rotor Cuff  2012   Right Shoulder    Social History: Patient lives with his partner.  The patient walks unassisted.  Nonsmoker.  No Known Allergies  Family history: family history includes Bladder Cancer in his father; Breast cancer in his mother; Colon cancer in his father; Healthy in his daughter, sister, son, and son; Hypertension in his sister; Hypothyroidism in his sister; Kidney failure in his mother; Prostate cancer in his father; Stroke in his mother.  Prior to Admission medications   Medication Sig Start Date End Date Taking? Authorizing Provider  acetaminophen (TYLENOL) 325 MG tablet Take 2 tablets (650 mg total) by mouth every 6 (six) hours as needed for mild pain (or Fever >/= 101). 05/17/19  Yes Emokpae, Courage, MD  ALPRAZolam Prudy Feeler) 1 MG tablet Take 1 mg by mouth every 6 (six) hours as needed for anxiety.  10/22/19  Yes [provider]  aspirin EC 81 MG tablet Take 1 tablet (81 mg total) by mouth daily with breakfast. Patient taking differently: Take 81 mg by mouth at bedtime. 03/13/19  Yes Shon Hale, MD  Cimetidine (TAGAMET PO) Take 75 mg by mouth daily as needed (if he feels his food coming up after meal).   Yes [provider]  diltiazem (CARDIZEM) 30 MG tablet Take 1 tablet (30 mg total) by mouth 3 (three) times daily. 08/07/20  Yes Emokpae, Courage, MD  Glucosamine HCl 1000 MG TABS Take 1,000 mg by mouth at bedtime.   Yes [provider]  levothyroxine (SYNTHROID, LEVOTHROID) 25 MCG tablet Take 25 mcg by mouth daily. 05/13/18  Yes [provider]  Multiple Vitamin (MULTIVITAMIN WITH MINERALS) TABS tablet Take 1 tablet by mouth daily. 50+   Yes [provider]  pantoprazole (PROTONIX) 40 MG tablet Take 1 tablet (40 mg total) by mouth 2 (two) times daily. 08/07/20 08/07/21 Yes Shon Hale, MD       Physical Exam: BP 93/74   Pulse 71   Temp 99.3 F  (37.4 C) (Oral)   Resp 18   Ht 6' (1.829 m)   Wt 89.1 kg   SpO2 94%   BMI 26.64 kg/m  General appearance: Well-developed, adult male, very sleepy, in no obvious distress, sleeping, snoring, rousbale and follows commands appropriately and answers questions appropriately.    Eyes: Anicteric, conjunctiva pink, lids and lashes normal. PERRL.    ENT: No nasal deformity, discharge, epistaxis.  Hearing normal. OP moist without lesions.  Lips normal, dentition normal. Neck: No neck masses.  Trachea midline.  No thyromegaly/tenderness. Lymph: No cervical or supraclavicular lymphadenopathy. Skin: Warm and dry.  No jaundice.  No suspicious rashes or lesions. Cardiac: RRR, nl S1-S2, no murmurs appreciated.  Capillary refill is brisk.  JVP normal.  No LE edema.  Radial pulses 2+ and symmetric. Respiratory: Normal respiratory rate and rhythm.  No wheezing, rales in the left base. Abdomen: Abdomen soft.  No TTP or guarding. No ascites, distension, hepatosplenomegaly.   MSK: No deformities or effusions of the large joints of the upper or lower extremities  bilaterally.  No cyanosis or clubbing. Neuro: Moves all extremities with 5 -/5 strength, symmetric coordination speech is fluent.      Psych: Sensorium intact and responding to questions, attention diminished but because he is very sleepy.  Behavior appropriate.  Affect blunted.  Judgment and insight appear normal.     Labs on Admission:  I have personally reviewed following labs and imaging studies: CBC: Recent Labs  Lab 08/16/20 0242  WBC 10.4  NEUTROABS 8.8*  HGB 12.3*  HCT 39.1  MCV 90.1  PLT 207   Basic Metabolic Panel: Recent Labs  Lab 08/16/20 0242  NA 137  K 3.9  CL 99  CO2 28  GLUCOSE 116*  BUN 11  CREATININE 0.83  CALCIUM 8.5*   GFR: Estimated Creatinine Clearance: 106.5 mL/min (by C-G formula based on SCr of 0.83 mg/dL).  Liver Function Tests: Recent Labs  Lab 08/16/20 0242  AST 19  ALT 11  ALKPHOS 63  BILITOT  0.4  PROT 7.3  ALBUMIN 3.8   No results for input(s): LIPASE, AMYLASE in the last 168 hours. No results for input(s): AMMONIA in the last 168 hours. Coagulation Profile: Recent Labs  Lab 08/16/20 0242  INR 1.1     Recent Results (from the past 240 hour(s))  Culture, blood (Routine x 2)     Status: None (Preliminary result)   Collection Time: 08/16/20  2:42 AM   Specimen: BLOOD  Result Value Ref Range Status   Specimen Description BLOOD LEFT ANTECUBITAL  Final   Special Requests   Final    BOTTLES DRAWN AEROBIC AND ANAEROBIC Blood Culture adequate volume   Culture   Final    NO GROWTH < 12 HOURS Performed at [facility], [address] [place], [place] [address]    Report Status PENDING  Incomplete  Resp Panel by RT-PCR (Flu A&B, Covid) Nasopharyngeal Swab     Status: None   Collection Time: 08/16/20  2:57 AM   Specimen: Nasopharyngeal Swab; Nasopharyngeal(NP) swabs in vial transport medium  Result Value Ref Range Status   SARS Coronavirus 2 by RT PCR NEGATIVE NEGATIVE Final    Comment: (NOTE) SARS-CoV-2 target nucleic acids are NOT DETECTED.  The SARS-CoV-2 RNA is generally detectable in upper respiratory specimens during the acute phase of infection. The lowest concentration of SARS-CoV-2 viral copies this assay can detect is 138 copies/mL. A negative result does not preclude SARS-Cov-2 infection and should not be used as the sole basis for treatment or other patient management decisions. A negative result may occur with  improper specimen collection/handling, submission of specimen other than nasopharyngeal swab, presence of viral mutation(s) within the areas targeted by this assay, and inadequate number of viral copies(<138 copies/mL). A negative result must be combined with clinical observations, patient history, and epidemiological information. The expected result is Negative.  Fact Sheet for Patients:  BloggerCourse.comhttps://www.fda.gov/media/152166/download  Fact Sheet for  Healthcare Providers:  SeriousBroker.ithttps://www.fda.gov/media/152162/download  This test is no t yet approved or cleared by the [place] FDA and  has been authorized for detection and/or diagnosis of SARS-CoV-2 by FDA under an Emergency Use Authorization (EUA). This EUA will remain  in effect (meaning this test can be used) for the duration of the COVID-19 declaration under Section 564(b)(1) of the Act, 21 U.S.C.section 360bbb-3(b)(1), unless the authorization is terminated  or revoked sooner.       Influenza A by PCR NEGATIVE NEGATIVE Final   Influenza B by PCR NEGATIVE NEGATIVE Final    Comment: (NOTE)  The Xpert Xpress SARS-CoV-2/FLU/RSV plus assay is intended as an aid in the diagnosis of influenza from Nasopharyngeal swab specimens and should not be used as a sole basis for treatment. Nasal washings and aspirates are unacceptable for Xpert Xpress SARS-CoV-2/FLU/RSV testing.  Fact Sheet for Patients: BloggerCourse.com  Fact Sheet for Healthcare Providers: SeriousBroker.it  This test is not yet approved or cleared by the [place] FDA and has been authorized for detection and/or diagnosis of SARS-CoV-2 by FDA under an Emergency Use Authorization (EUA). This EUA will remain in effect (meaning this test can be used) for the duration of the COVID-19 declaration under Section 564(b)(1) of the Act, 21 U.S.C. section 360bbb-3(b)(1), unless the authorization is terminated or revoked.  Performed at [facility], [address], [place], [place] [address]   Culture, blood (Routine x 2)     Status: None (Preliminary result)   Collection Time: 08/16/20  3:30 AM   Specimen: BLOOD LEFT FOREARM  Result Value Ref Range Status   Specimen Description BLOOD LEFT FOREARM  Final   Special Requests   Final    BOTTLES DRAWN AEROBIC AND ANAEROBIC Blood Culture results may not be optimal due to an excessive volume of blood received in culture bottles    Culture   Final    NO GROWTH < 12 HOURS Performed at [facility], [address], [place], [place] [address]    Report Status PENDING  Incomplete           Radiological Exams on Admission: Personally reviewed chest x-ray shows no focal airspace disease or opacity: DG Chest Port 1 View  Result Date: 08/16/2020 CLINICAL DATA:  Fever EXAM: PORTABLE CHEST 1 VIEW COMPARISON:  August 05, 2020 FINDINGS: The heart size and mediastinal contours are within normal limits. Mildly increased interstitial markings seen at both lung bases which was seen on prior exam. No new airspace consolidation or pleural effusion. The visualized skeletal structures are unremarkable. IMPRESSION: Mildly increased interstitial markings at both lung bases as on prior exam which could be due to chronic lung changes or inflammatory process. Electronically Signed   By: Jonna Clark M.D.   On: 08/16/2020 03:23    EKG: Independently reviewed.  Tachycardia, hard to see P waves       Assessment/Plan   Sepsis and acute hypoxic respiratory due to aspiration pneumonia Patient presented with fever, tachycardia, dyspnea and hypoxia in setting of recurrent aspiration pneumonia.  He has rales on exam Chest x-ray is clear.  Clearly his esophageal spasm is the driver for recurrent aspiration.  PPI and H2RA seem to help a lot.  Diltiazem 30 TID doesn't seem to help at all.  Query if Xanax is worsening aspiration as spouse relates his symptoms worsen with emotional stress.  -Start Rocephin -Incentive spirometer, flutter valve -Sputum culture   Esophageal dysmotility -Consult GI -Continue diltiazem, transition to 120CD  Hypothyroidism -Continue levothyroxine  Anxiety and benzodiazepine dependence -Continue Xanax  Sleep apnea -CPAP at night  Tachycardia -Repeat ECG     DVT prophylaxis: Lovenox  Code Status: FULL  Family Communication: wife at bedside  Disposition Plan: Anticipate IV antibiotics and GI  consultation Consults called: GI Admission status: INPATIENT   At the time of admission, it appears that the appropriate admission status for this patient is INPATIENT. This is judged to be reasonable and necessary in order to provide the required intensity of service to ensure the patient's safety given: -presenting symptoms of dyspnea, fatigue, and rigors -physical exam findings of rales  on exam, hypoxia, tachycardia and fever, and   -in the context of their chronic comorbidities recurrent aspiration pneuomnia failed recent outpatient treatment    Together, these circumstances are felt to place him at high risk for further clinical deterioration threatening life, limb, or organ requiring a high intensity of service due to this acute illness that poses a threat to life, limb or bodily function.  I certify that at the point of admission it is my clinical judgment that the patient will require inpatient hospital care spanning beyond 2 midnights from the point of admission and that early discharge would result in unnecessary risk of decompensation and readmission or threat to life, limb or bodily function.   Medical decision making: Patient seen at 11:19 AM on 08/16/2020.  What exists of the patient's chart was reviewed in depth and summarized above.  Clinical condition: stable on supplemental O2, BP soft but asymptomatic and mentation good.        Earl Lites Asherah Lavoy [facility] Please page though AMION or Epic secure chat:  For password, contact charge nurse

## 2020-08-16 NOTE — Consult Note (Addendum)
Consulting  Provider:  Primary Care Physician:  Jason Coop, FNP Primary Gastroenterologist:  Dr. Karilyn Cota  Reason for Consultation:  Aspiration PNA, esophageal dysmotility  HPI:  Christian Ellison is a 59 y.o. male with a past medical history of esophageal dysmotility, recurrent aspiration pneumonia, hepatitis C status post treatment, who presented to [facility], ER with acute respiratory failure and fatigue.  Patient is somnolent on exam, as such majority of HPI is taken from his wife who is bedside.  Patient was recently discharged from our facility 08/07/2020 with similar presentation.  Was treated for aspiration pneumonia during that hospitalization.  He underwent EGD with dilation with minimal improvement of symptoms.  He was discharged home on diltiazem 60 mg 3 times daily and patient's wife states this did not work as well as the extended release that he was on prior.  He did complete 5 days of Augmentin as well.  She notes 5 days ago he began he again began vomiting and spitting up.  States that he woke up in the middle of the night, removed his CPAP and took a sip of liquid though did not like normal sitting straight up on the end of the bed and subsequently aspirated.  Since that time he has progressively worsened to the point of 24 hours ago he became severely fatigued and hypoxic.  In the ER, patient's chest x-ray was largely negative but he did have fever, tachycardia, and hypoxia.  He has been started on antibiotics as well as supplemental O2.  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  Most recent EGD as above.  He has been maintained on diltiazem.  Past Medical History:  Diagnosis Date  . Anxiety   . Aspiration pneumonia (HCC) 05/26/2018   RLL  . Chronic pain   . Esophageal stricture   . GERD (gastroesophageal reflux disease)   . 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   Marland Kitchen Opiate abuse, continuous (HCC)   . Sleep apnea    CPAP nightly    Past Surgical History:  Procedure Laterality Date  . BIOPSY  03/18/2018   Procedure: BIOPSY;  Surgeon: Malissa Hippo, MD;  Location: [facility];  Service: Endoscopy;;  gastric   . COLONOSCOPY N/A 11/22/2013   Dr. Karilyn Cota: Normal except for hemorrhoids.  Next colonoscopy 10 years.  . ESOPHAGEAL DILATION N/A 03/18/2018   Procedure: ESOPHAGEAL DILATION;  Surgeon: Malissa Hippo, MD;  Location: [facility];  Service: Endoscopy;  Laterality: N/A;  . ESOPHAGEAL DILATION N/A 08/07/2020   Procedure: ESOPHAGEAL DILATION;  Surgeon: Malissa Hippo, MD;  Location: [facility];  Service: Endoscopy;  Laterality: N/A;  . ESOPHAGEAL MANOMETRY N/A 06/15/2018   Procedure: ESOPHAGEAL MANOMETRY (EM);  Surgeon: Napoleon Form, MD;  Location: [facility];  Service: Endoscopy;  Laterality: N/A;  . ESOPHAGOGASTRODUODENOSCOPY (EGD) WITH PROPOFOL N/A 03/18/2018   Dr. Karilyn Cota: Benign-appearing mild esophageal stenosis proximal to the GE J, status post dilation.  2 cm hiatal hernia.  Gastritis, biopsies negative for H. pylori  . ESOPHAGOGASTRODUODENOSCOPY (EGD) WITH PROPOFOL N/A 08/07/2020   Procedure: ESOPHAGOGASTRODUODENOSCOPY (EGD) WITH PROPOFOL;  Surgeon: Malissa Hippo, MD;  Location: [facility];  Service: Endoscopy;  Laterality: N/A;  . Fracture Right Leg     Patient has rod/screws in this leg  . ORIF CALCANEOUS FRACTURE Left 07/04/2020   Procedure: OPEN REDUCTION INTERNAL FIXATION (ORIF) LEFT TALUS  AND CALCANEOUS FRACTURE, SUBTALAR JOINT ARTHROTOMY WITH REMOVAL OF LOOSE BODIES, TALONAVICULAR JOINT ARTHOTOMY WITH REMOVAL OF LOOSE BODIES;  Surgeon: Terance HartAdair, Christopher R, MD;  [facility];  Service: Orthopedics;  Laterality: Left;  . Rotor Cuff  2012   Right Shoulder    Prior to Admission medications   Medication Sig Start Date End Date Taking? Authorizing Provider  acetaminophen (TYLENOL) 325 MG tablet Take 2  tablets (650 mg total) by mouth every 6 (six) hours as needed for mild pain (or Fever >/= 101). 05/17/19  Yes Emokpae, Courage, MD  ALPRAZolam Prudy Feeler(XANAX) 1 MG tablet Take 1 mg by mouth every 6 (six) hours as needed for anxiety.  10/22/19  Yes [provider]  aspirin EC 81 MG tablet Take 1 tablet (81 mg total) by mouth daily with breakfast. Patient taking differently: Take 81 mg by mouth at bedtime. 03/13/19  Yes Shon HaleEmokpae, Courage, MD  Cimetidine (TAGAMET PO) Take 75 mg by mouth daily as needed (if he feels his food coming up after meal).   Yes [provider]  Glucosamine HCl 1000 MG TABS Take 1,000 mg by mouth at bedtime.   Yes [provider]  levothyroxine (SYNTHROID, LEVOTHROID) 25 MCG tablet Take 25 mcg by mouth daily. 05/13/18  Yes [provider]  Multiple Vitamin (MULTIVITAMIN WITH MINERALS) TABS tablet Take 1 tablet by mouth daily. 50+   Yes [provider]  pantoprazole (PROTONIX) 40 MG tablet Take 1 tablet (40 mg total) by mouth 2 (two) times daily. 08/07/20 08/07/21 Yes Emokpae, Courage, MD  diltiazem (CARDIZEM) 30 MG tablet Take 1 tablet (30 mg total) by mouth 3 (three) times daily. 08/07/20 08/16/20 Yes Shon HaleEmokpae, Courage, MD    Current Facility-Administered Medications  Medication Dose Route Frequency Provider Last Rate Last Admin  . acetaminophen (TYLENOL) tablet 650 mg  650 mg Oral Q6H PRN Danford, Earl Liteshristopher P, MD       Or  . acetaminophen (TYLENOL) suppository 650 mg  650 mg Rectal Q6H PRN Danford, Earl Liteshristopher P, MD      . ALPRAZolam Prudy Feeler(XANAX) tablet 1 mg  1 mg Oral Q6H PRN Alberteen Samanford, Christopher P, MD   1 mg at 08/16/20 1508  . aspirin EC tablet 81 mg  81 mg Oral QHS Danford, Christopher P, MD      . cefTRIAXone (ROCEPHIN) 2 g in sodium chloride 0.9 % 100 mL IVPB  2 g Intravenous Q24H Danford, Earl Liteshristopher P, MD   Stopping Infusion hung by another clincian at 08/16/20 [id number]  . diltiazem (CARDIZEM CD) 24 hr capsule 120 mg  120 mg Oral Daily  Danford, Earl Liteshristopher P, MD   120 mg at 08/16/20 1326  . enoxaparin (LOVENOX) injection 40 mg  40 mg Subcutaneous Q24H Danford, Earl Liteshristopher P, MD   40 mg at 08/16/20 1328  . famotidine (PEPCID) tablet 10 mg  10 mg Oral BID Danford, Earl Liteshristopher P, MD      . guaiFENesin (MUCINEX) 12 hr tablet 600 mg  600 mg Oral BID Alberteen Samanford, Christopher P, MD   600 mg at 08/16/20 1327  . [START ON 08/17/2020] levothyroxine (SYNTHROID) tablet 25 mcg  25 mcg Oral Daily Danford, Christopher P, MD      . ondansetron (ZOFRAN) tablet 4 mg  4 mg Oral Q6H PRN Danford, Earl Liteshristopher P, MD       Or  . ondansetron (ZOFRAN) injection 4 mg  4 mg Intravenous Q6H PRN Danford, Earl Liteshristopher P, MD      . pantoprazole (PROTONIX) EC tablet 40 mg  40 mg Oral BID Alberteen Sam, MD       Current Outpatient Medications  Medication Sig Dispense Refill  . acetaminophen (TYLENOL) 325 MG tablet Take 2 tablets (650 mg total) by mouth every 6 (six) hours as needed for mild pain (or Fever >/= 101). 30 tablet 1  . ALPRAZolam (XANAX) 1 MG tablet Take 1 mg by mouth every 6 (six) hours as needed for anxiety.     Marland Kitchen aspirin EC 81 MG tablet Take 1 tablet (81 mg total) by mouth daily with breakfast. (Patient taking differently: Take 81 mg by mouth at bedtime.) 30 tablet 2  . Cimetidine (TAGAMET PO) Take 75 mg by mouth daily as needed (if he feels his food coming up after meal).    . Glucosamine HCl 1000 MG TABS Take 1,000 mg by mouth at bedtime.    Marland Kitchen levothyroxine (SYNTHROID, LEVOTHROID) 25 MCG tablet Take 25 mcg by mouth daily.  11  . Multiple Vitamin (MULTIVITAMIN WITH MINERALS) TABS tablet Take 1 tablet by mouth daily. 50+    . pantoprazole (PROTONIX) 40 MG tablet Take 1 tablet (40 mg total) by mouth 2 (two) times daily. 60 tablet 5    Allergies as of 08/16/2020  . (No Known Allergies)    Family History  Problem Relation Age of Onset  . Breast cancer Mother   . Kidney failure Mother   . Stroke Mother   . Colon cancer Father   . Bladder  Cancer Father   . Prostate cancer Father   . Hypertension Sister   . Hypothyroidism Sister   . Healthy Sister   . Healthy Daughter   . Healthy Son   . Healthy Son     Social History   Socioeconomic History  . Marital status: Married    Spouse name: Not on file  . Number of children: Not on file  . Years of education: Not on file  . Highest education level: Not on file  Occupational History  . Occupation: unemployed  Tobacco Use  . Smoking status: Never Smoker  . Smokeless tobacco: Never Used  Vaping Use  . Vaping Use: Never used  Substance and Sexual Activity  . Alcohol use: Not Currently  . Drug use: Not Currently    Types: Benzodiazepines    Comment: opiates  . Sexual activity: Not on file  Other Topics Concern  . Not on file  Social History Narrative  . Not on file   Social Determinants of Health   Financial Resource Strain: Not on file  Food Insecurity: Not on file  Transportation Needs: Not on file  Physical Activity: Not on file  Stress: Not on file  Social Connections: Not on file  Intimate Partner Violence: Not on file    Review of Systems: General: Fatigue, Negative for anorexia, weight loss, fever, chills,  weakness. Eyes: Negative for vision changes.  ENT: Negative for hoarseness, difficulty swallowing , nasal congestion. CV: Negative for chest pain, angina, palpitations, dyspnea on exertion, peripheral edema.  Respiratory: dyspnea, cough GI: See history of present illness. GU:  Negative for dysuria, hematuria, urinary incontinence, urinary frequency, nocturnal urination.  MS: Negative for joint pain, low back pain.  Derm: Negative for rash or itching.  Neuro: Negative for weakness, abnormal sensation, seizure, frequent headaches, memory loss, confusion.  Psych: Negative for anxiety, depression, suicidal ideation, hallucinations.  Endo: Negative for unusual weight change.  Heme: Negative for bruising or bleeding. Allergy: Negative for rash or  hives.  Physical Exam: Vital  signs in last 24 hours: Temp:  [99.3 F (37.4 C)-103 F (39.4 C)] 99.3 F (37.4 C) (12/31 0530) Pulse Rate:  [33-108] 72 (12/31 1700) Resp:  [13-27] 23 (12/31 1700) BP: (84-142)/(41-84) 99/59 (12/31 1700) SpO2:  [86 %-98 %] 94 % (12/31 1700) Weight:  [89.1 kg] 89.1 kg (12/31 0231)   General:   Solmnolent,  Well-developed, well-nourished Head:  Normocephalic and atraumatic. Eyes:  Sclera clear, no icterus.   Conjunctiva pink. Ears:  Normal auditory acuity. Nose:  No deformity, discharge,  or lesions. Mouth:  No deformity or lesions, dentition normal. Neck:  Supple; no masses or thyromegaly. Lungs:  Crackles in BL lung bases  No wheezes,  or rhonchi. No acute distress. Heart:  Regular rate and rhythm; no murmurs, clicks, rubs,  or gallops. Abdomen:  Soft, nontender and nondistended. No masses, hepatosplenomegaly or hernias noted. Normal bowel sounds, without guarding, and without rebound.   Rectal:  Deferred until time of colonoscopy.   Msk:  Symmetrical without gross deformities. Normal posture. Pulses:  Normal pulses noted. Extremities:  Without clubbing or edema. Neurologic:  Alert and  oriented x4;  grossly normal neurologically. Skin:  Intact without significant lesions or rashes. Cervical Nodes:  No significant cervical adenopathy. Psych:  Alert and cooperative. Normal mood and affect.  Intake/Output from previous day: No intake/output data recorded. Intake/Output this shift: No intake/output data recorded.  Lab Results: Recent Labs    08/16/20 0242  WBC 10.4  HGB 12.3*  HCT 39.1  PLT 207   BMET Recent Labs    08/16/20 0242  NA 137  K 3.9  CL 99  CO2 28  GLUCOSE 116*  BUN 11  CREATININE 0.83  CALCIUM 8.5*   LFT Recent Labs    08/16/20 0242  PROT 7.3  ALBUMIN 3.8  AST 19  ALT 11  ALKPHOS 63  BILITOT 0.4   PT/INR Recent Labs    08/16/20 0242  LABPROT 13.4  INR 1.1   Hepatitis Panel No results for input(s):  HEPBSAG, HCVAB, HEPAIGM, HEPBIGM in the last 72 hours. C-Diff No results for input(s): CDIFFTOX in the last 72 hours.  Studies/Results: DG Chest Port 1 View  Result Date: 08/16/2020 CLINICAL DATA:  Fever EXAM: PORTABLE CHEST 1 VIEW COMPARISON:  August 05, 2020 FINDINGS: The heart size and mediastinal contours are within normal limits. Mildly increased interstitial markings seen at both lung bases which was seen on prior exam. No new airspace consolidation or pleural effusion. The visualized skeletal structures are unremarkable. IMPRESSION: Mildly increased interstitial markings at both lung bases as on prior exam which could be due to chronic lung changes or inflammatory process. Electronically Signed   By: Jonna Clark M.D.   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  He recently underwent EGD last week with dilation with minimal improvement in his symptoms.  -Recommend speech therapy consultation.   -We will transition patient back to extended release diltiazem once blood pressure able to tolerate as patient's wife states this works much better for patient.   -Maximize acid suppression with PPI therapy as well as H2 blockers -Close follow-up with Dr. Karilyn Cota in the outpatient setting.  He may benefit from consultation for myotomy, pneumatic dilation, or Botox. -Agree with antibiotics for  aspiration pneumonia.  Incentive spirometer and flutter valve bedside.  Thank you for letting us take part in this patient's care.  GI to continue to follow.  Hennie Duos. Marletta Lor, D.O. Gastroenterology and Hepatology [facility]    LOS: 0 days     08/16/2020, 5:19  PM

## 2020-08-16 NOTE — ED Notes (Signed)
Pt awaiting bed ready

## 2020-08-16 NOTE — Progress Notes (Signed)
Patient has brought his unit from home and his wife will be setting up his unit. RT gave water to use in his device. Patient on still on 2LPM [place] and resting. RT will continue to monitor patient.

## 2020-08-16 NOTE — ED Provider Notes (Signed)
[facility] EMERGENCY DEPARTMENT Provider Note   CSN: [id number] Arrival date & time: 08/16/20  [id number]     History Chief Complaint  Patient presents with  . Code Sepsis    Christian Ellison is a 59 y.o. male.  The history is provided by the patient.  Weakness Severity:  Moderate Onset quality:  Gradual Timing:  Intermittent Progression:  Worsening Chronicity:  New Relieved by:  Nothing Worsened by:  Nothing Associated symptoms: cough and fever   Associated symptoms: no diarrhea, no headaches and no vomiting    Patient history of aspiration pneumonia, esophageal stricture presents with fever, cough shortness of breath and weakness.  Patient reports similar episode recently and was hospitalized for a similar issue. No vomiting or diarrhea.    Past Medical History:  Diagnosis Date  . Anxiety   . Aspiration pneumonia (HCC) 05/26/2018   RLL  . Chronic pain   . Esophageal stricture   . GERD (gastroesophageal reflux disease)   . 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   Marland Kitchen. Opiate abuse, continuous (HCC)   . Sleep apnea    CPAP nightly    Patient Active Problem List   Diagnosis Date Noted  . Abnormal CT scan, esophagus   . Severe sepsis (HCC) 08/05/2020  . Acute hypoxemic respiratory failure (HCC) 07/13/2020  . Pneumonia 06/24/2019  . Pneumonitis 06/24/2019  . Aspiration pneumonitis (HCC) 06/10/2019  . Closed fracture of xiphoid process   . PNA (pneumonia) 05/15/2019  . Multifocal pneumonia 03/11/2019  . Acute respiratory failure with hypoxemia (HCC) 03/11/2019  . Sinus tachycardia 03/11/2019  . Sepsis due to pneumonia (HCC) 03/11/2019  . Hypothyroidism 03/11/2019  . Bronchopneumonia 11/06/2018  . Acute Respiratory failure with hypoxia (2/2 Asp PNA) 11/06/2018  . Recurrent aspiration pneumonia (HCC)   . Fever 06/09/2018  . Tachycardia 06/09/2018  . Hypoxia 06/09/2018  . Hypotension 06/09/2018  . Esophageal motility disorder   . Aspiration pneumonia  of right lower lobe (HCC)   . Sepsis (HCC) 05/26/2018  . Pulmonary edema 02/02/2018  . HCAP (healthcare-associated pneumonia) 02/02/2018  . Weakness generalized 02/02/2018  . Hypertension 02/02/2018  . CHF (congestive heart failure) (HCC) 02/02/2018  . Nausea & vomiting 02/02/2018  . Dysphagia 02/02/2018  . Esophageal stricture/stenosis/esophageal dysmotility with recurrent aspiration 02/02/2018  . Esophageal stenosis 02/02/2018  . GERD (gastroesophageal reflux disease) 02/02/2018  . Polysubstance abuse (HCC) 02/02/2018  . Acute respiratory failure with hypoxia (HCC)   . Transaminitis   . Elevated troponin   . Overdose 01/02/2018  . Hx of hepatitis C 01/17/2013  . Depression 01/17/2013  . Anxiety with depression 01/17/2013  . Right knee pain 01/17/2013    Past Surgical History:  Procedure Laterality Date  . BIOPSY  03/18/2018   Procedure: BIOPSY;  Surgeon: Malissa Hippoehman, Najeeb U, MD;  Location: [facility];  Service: Endoscopy;;  gastric   . COLONOSCOPY N/A 11/22/2013   Dr. Karilyn Cotaehman: Normal except for hemorrhoids.  Next colonoscopy 10 years.  . ESOPHAGEAL DILATION N/A 03/18/2018   Procedure: ESOPHAGEAL DILATION;  Surgeon: Malissa Hippoehman, Najeeb U, MD;  Location: [facility];  Service: Endoscopy;  Laterality: N/A;  . ESOPHAGEAL DILATION N/A 08/07/2020   Procedure: ESOPHAGEAL DILATION;  Surgeon: Malissa Hippoehman, Najeeb U, MD;  Location: [facility];  Service: Endoscopy;  Laterality: N/A;  . ESOPHAGEAL MANOMETRY N/A 06/15/2018   Procedure: ESOPHAGEAL MANOMETRY (EM);  Surgeon: Napoleon FormNandigam, Kavitha V, MD;  Location: [facility];  Service: Endoscopy;  Laterality: N/A;  . ESOPHAGOGASTRODUODENOSCOPY (EGD) WITH  PROPOFOL N/A 03/18/2018   Dr. Karilyn Cota: Benign-appearing mild esophageal stenosis proximal to the GE J, status post dilation.  2 cm hiatal hernia.  Gastritis, biopsies negative for H. pylori  . ESOPHAGOGASTRODUODENOSCOPY (EGD) WITH PROPOFOL N/A 08/07/2020   Procedure: ESOPHAGOGASTRODUODENOSCOPY (EGD) WITH  PROPOFOL;  Surgeon: Malissa Hippo, MD;  Location: [facility];  Service: Endoscopy;  Laterality: N/A;  . Fracture Right Leg     Patient has rod/screws in this leg  . ORIF CALCANEOUS FRACTURE Left 07/04/2020   Procedure: OPEN REDUCTION INTERNAL FIXATION (ORIF) LEFT TALUS AND CALCANEOUS FRACTURE, SUBTALAR JOINT ARTHROTOMY WITH REMOVAL OF LOOSE BODIES, TALONAVICULAR JOINT ARTHOTOMY WITH REMOVAL OF LOOSE BODIES;  Surgeon: Terance Hart, MD;  Location: [facility] OR;  Service: Orthopedics;  Laterality: Left;  . Rotor Cuff  2012   Right Shoulder       Family History  Problem Relation Age of Onset  . Breast cancer Mother   . Colon cancer Father   . Bladder Cancer Father   . Prostate cancer Father   . Hypertension Sister   . Hypothyroidism Sister   . Healthy Sister   . Healthy Daughter   . Healthy Son   . Healthy Son     Social History   Tobacco Use  . Smoking status: Never Smoker  . Smokeless tobacco: Never Used  Vaping Use  . Vaping Use: Never used  Substance Use Topics  . Alcohol use: Not Currently  . Drug use: Not Currently    Types: Benzodiazepines    Comment: opiates    Home Medications Prior to Admission medications   Medication Sig Start Date End Date Taking? Authorizing Provider  acetaminophen (TYLENOL) 325 MG tablet Take 2 tablets (650 mg total) by mouth every 6 (six) hours as needed for mild pain (or Fever >/= 101). 05/17/19   Shon Hale, MD  ALPRAZolam Prudy Feeler) 1 MG tablet Take 1 mg by mouth every 6 (six) hours as needed for anxiety.  10/22/19   [provider]  aspirin EC 81 MG tablet Take 1 tablet (81 mg total) by mouth daily with breakfast. Patient taking differently: Take 81 mg by mouth at bedtime. 03/13/19   Shon Hale, MD  diltiazem (CARDIZEM) 30 MG tablet Take 1 tablet (30 mg total) by mouth 3 (three) times daily. 08/07/20   Shon Hale, MD  Glucosamine HCl 1000 MG TABS Take 1,000 mg by mouth at bedtime.    [provider]   levothyroxine (SYNTHROID, LEVOTHROID) 25 MCG tablet Take 25 mcg by mouth daily. 05/13/18   [provider]  Multiple Vitamin (MULTIVITAMIN WITH MINERALS) TABS tablet Take 1 tablet by mouth daily. 50+    [provider]  pantoprazole (PROTONIX) 40 MG tablet Take 1 tablet (40 mg total) by mouth 2 (two) times daily. 08/07/20 08/07/21  Shon Hale, MD    Allergies    Patient has no known allergies.  Review of Systems   Review of Systems  Constitutional: Positive for fatigue and fever.  Respiratory: Positive for cough.   Gastrointestinal: Negative for diarrhea and vomiting.  Neurological: Positive for weakness. Negative for headaches.  All other systems reviewed and are negative.   Physical Exam Updated Vital Signs BP 113/70   Pulse 97   Temp 99.3 F (37.4 C) (Oral)   Resp 16   Ht 1.829 m (6')   Wt 89.1 kg   SpO2 97%   BMI 26.64 kg/m   Physical Exam CONSTITUTIONAL: Well developed/well nourished HEAD: Normocephalic/atraumatic EYES: EOMI/PERRL ENMT:  Mucous membranes moist NECK: supple no meningeal signs SPINE/BACK:entire spine nontender CV: S1/S2 noted, no murmurs/rubs/gallops noted LUNGS: Coarse breath sounds bilaterally, no tachypnea, patient currently on 4 L oxygen ABDOMEN: soft, nontender, no rebound or guarding, bowel sounds noted throughout abdomen GU:no cva tenderness NEURO: Pt is awake/alert/appropriate, moves all extremitiesx4.  No facial droop.   EXTREMITIES: pulses normal/equal, full ROM SKIN: warm, color normal PSYCH: no abnormalities of mood noted, alert and oriented to situation  ED Results / Procedures / Treatments   Labs (all labs ordered are listed, but only abnormal results are displayed) Labs Reviewed  COMPREHENSIVE METABOLIC PANEL - Abnormal; Notable for the following components:      Result Value   Glucose, Bld 116 (*)    Calcium 8.5 (*)    All other components within normal limits  CBC WITH DIFFERENTIAL/PLATELET - Abnormal;  Notable for the following components:   Hemoglobin 12.3 (*)    Neutro Abs 8.8 (*)    All other components within normal limits  URINALYSIS, ROUTINE W REFLEX MICROSCOPIC - Abnormal; Notable for the following components:   Hgb urine dipstick SMALL (*)    All other components within normal limits  RESP PANEL BY RT-PCR (FLU A&B, COVID) ARPGX2  CULTURE, BLOOD (ROUTINE X 2)  CULTURE, BLOOD (ROUTINE X 2)  URINE CULTURE  LACTIC ACID, PLASMA  LACTIC ACID, PLASMA  PROTIME-INR  APTT    EKG EKG Interpretation  Date/Time:  Friday August 16 2020 02:37:22 EST Ventricular Rate:  109 PR Interval:    QRS Duration: 83 QT Interval:  332 QTC Calculation: 447 R Axis:   -17 Text Interpretation: indeterminate rhythm Borderline left axis deviation Probable anteroseptal infarct, old Interpretation limited secondary to artifact Confirmed by Zadie Rhine ([id number]) on 08/16/2020 2:41:54 AM   Radiology DG Chest Port 1 View  Result Date: 08/16/2020 CLINICAL DATA:  Fever EXAM: PORTABLE CHEST 1 VIEW COMPARISON:  August 05, 2020 FINDINGS: The heart size and mediastinal contours are within normal limits. Mildly increased interstitial markings seen at both lung bases which was seen on prior exam. No new airspace consolidation or pleural effusion. The visualized skeletal structures are unremarkable. IMPRESSION: Mildly increased interstitial markings at both lung bases as on prior exam which could be due to chronic lung changes or inflammatory process. Electronically Signed   By: Jonna Clark M.D.   On: 08/16/2020 03:23    Procedures .Critical Care Performed by: Zadie Rhine, MD Authorized by: Zadie Rhine, MD   Critical care provider statement:    Critical care time (minutes):  30   Critical care start time:  08/16/2020 2:55 AM   Critical care end time:  08/16/2020 3:25 AM   Critical care time was exclusive of:  Separately billable procedures and treating other patients   Critical care was  necessary to treat or prevent imminent or life-threatening deterioration of the following conditions:  Respiratory failure and sepsis   Critical care was time spent personally by me on the following activities:  Development of treatment plan with patient or surrogate, discussions with consultants, evaluation of patient's response to treatment, examination of patient, obtaining history from patient or surrogate, re-evaluation of patient's condition, pulse oximetry, ordering and review of laboratory studies, ordering and review of radiographic studies and review of old charts   I assumed direction of critical care for this patient from another provider in my specialty: no     Care discussed with: admitting provider      Medications Ordered in ED Medications  cefTRIAXone (  ROCEPHIN) 2 g in sodium chloride 0.9 % 100 mL IVPB (2 g Intravenous New Bag/Given 08/16/20 0343)  azithromycin (ZITHROMAX) 500 mg in sodium chloride 0.9 % 250 mL IVPB (500 mg Intravenous New Bag/Given 08/16/20 0455)  lactated ringers bolus 1,000 mL (0 mLs Intravenous Stopped 08/16/20 0500)    And  lactated ringers bolus 1,000 mL (0 mLs Intravenous Stopped 08/16/20 0620)  acetaminophen (TYLENOL) tablet 650 mg (650 mg Oral Given 08/16/20 8115)    ED Course  I have reviewed the triage vital signs and the nursing notes.  Pertinent labs & imaging results that were available during my care of the patient were reviewed by me and considered in my medical decision making (see chart for details).    MDM Rules/Calculators/A&P                          5:44 AM Patient presents with fever, cough shortness of breath and weakness.  Patient was recently admitted for similar episode and was found to have aspiration pneumonia.  Patient was discharged home on antibiotics.  He now presents with fever tachycardia and new oxygen requirement IV fluids and antibiotics have been ordered.  Patient will require repeat admission 6:22 AM Patient is  awake alert and overall improved. However he is still requiring oxygen. I had a long conversation with his significant other. She reports he has had multiple bouts of aspiration pneumonia. He is followed by gastroenterology here locally, but may be referred to [facility]  Significant other reports over the past day patient is noted increasing cough and fever. After ambulation he has been tachycardic and hypoxic to the 80s and he is not on home oxygen. Although x-ray does not reveal conclusive pneumonia, suspect that he is already developing an aspiration pneumonia, previously felt due to his esophageal stricture Discussed with Dr. Thomes Dinning fo admission   Final Clinical Impression(s) / ED Diagnoses Final diagnoses:  Acute respiratory failure with hypoxia [facility])    Rx / DC Orders ED Discharge Orders    None       Zadie Rhine, MD 08/16/20 [phone]

## 2020-08-16 NOTE — ED Notes (Signed)
Bed marked ready   Call to floor   RN receiving pt is off the floor   Request to call when return

## 2020-08-16 NOTE — ED Notes (Signed)
Pt's wife reports when pt is on the time released Diltiazem he does better with his swallowing versus when he takes it three times daily. She also reports she has been giving him an old prescription of Tagamet that they longer have a prescription for but that helps him a lot. Dr. Maryfrances Bunnell notified.

## 2020-08-16 NOTE — ED Notes (Signed)
Bed request 6.5hr  Assigned by bed control   Awaiting bed ready from floor

## 2020-08-16 NOTE — ED Notes (Signed)
Call to [facility], Tim to request assist to move pt

## 2020-08-16 NOTE — Progress Notes (Signed)
Following for Code Sepsis  

## 2020-08-16 NOTE — ED Notes (Signed)
Told at 1655 RN receiving pt had gone to lab  Report could not be given   Second call for report   RN not available for report   Request to give report to CN  call disconnected

## 2020-08-16 NOTE — ED Triage Notes (Signed)
Pt c/o fever, SOB, and weakness. Recently dx with pnu.

## 2020-08-16 NOTE — ED Notes (Signed)
Report to Elease Hashimoto, RN, CN

## 2020-08-17 DIAGNOSIS — N179 Acute kidney failure, unspecified: Secondary | ICD-10-CM

## 2020-08-17 DIAGNOSIS — R6520 Severe sepsis without septic shock: Secondary | ICD-10-CM

## 2020-08-17 DIAGNOSIS — A419 Sepsis, unspecified organism: Secondary | ICD-10-CM | POA: Diagnosis not present

## 2020-08-17 LAB — BASIC METABOLIC PANEL
Anion gap: 8 (ref 5–15)
BUN: 11 mg/dL (ref 6–20)
CO2: 28 mmol/L (ref 22–32)
Calcium: 8.4 mg/dL — ABNORMAL LOW (ref 8.9–10.3)
Chloride: 103 mmol/L (ref 98–111)
Creatinine, Ser: 0.67 mg/dL (ref 0.61–1.24)
GFR, Estimated: >60 mL/min (ref >60)
Glucose, Bld: 134 mg/dL — ABNORMAL HIGH (ref 70–99)
Potassium: 3.6 mmol/L (ref 3.5–5.1)
Sodium: 139 mmol/L (ref 135–145)

## 2020-08-17 LAB — URINE CULTURE: Culture: NO GROWTH

## 2020-08-17 LAB — CBC
HCT: 33.2 % — ABNORMAL LOW (ref 39.0–52.0)
Hemoglobin: 10.4 g/dL — ABNORMAL LOW (ref 13.0–17.0)
MCH: 28.8 pg (ref 26.0–34.0)
MCHC: 31.3 g/dL (ref 30.0–36.0)
MCV: 92 fL (ref 80.0–100.0)
Platelets: 175 10*3/uL (ref 150–400)
RBC: 3.61 MIL/uL — ABNORMAL LOW (ref 4.22–5.81)
RDW: 12.3 % (ref 11.5–15.5)
WBC: 4.3 10*3/uL (ref 4.0–10.5)
nRBC: 0 % (ref 0.0–0.2)

## 2020-08-17 MED ORDER — CEFDINIR 300 MG PO CAPS: 300.0000 mg | ORAL_CAPSULE | Freq: Two times a day (BID) | ORAL | 0 refills | Status: DC

## 2020-08-17 MED ORDER — SODIUM CHLORIDE 0.9 % IV BOLUS
1000.0000 mL | Freq: Once | INTRAVENOUS | Status: AC
  Administered 2020-08-17: 1000 mL via INTRAVENOUS

## 2020-08-17 MED ORDER — DILTIAZEM HCL ER 120 MG PO CP24: 120.0000 mg | ORAL_CAPSULE | Freq: Every day | ORAL | 3 refills | Status: DC

## 2020-08-17 NOTE — Progress Notes (Signed)
Patient ambulated in hallway on room air and maintained an O2 saturation in the mid 90s.. Patient states understanding of discharge instructions

## 2020-08-17 NOTE — Discharge Summary (Signed)
Physician Discharge Summary  Christian Ellison ZOX:[id number] DOB: Dec 05, 1960 DOA: 08/16/2020  PCP: Christian CoopNicholson, Christian J IV, FNP  Admit date: 08/16/2020 Discharge date: 08/17/2020  Admitted From: Home  Disposition:  Home   Recommendations for Outpatient Follow-up:  1. Follow up with PCP in 1-2 weeks 2. Follow up with GI in 4-6 weeks 3. Dr. Evelene Ellison: Consider efforts to reduce benzodiazepine use in this patient, to minimize the extent to which sedation drives his aspiration      Home Health: None  Equipment/Devices: None  Discharge Condition: Good  CODE STATUS: FULL Diet recommendation: Regular  Brief/Interim Summary: Mr. Christian Ellison is a 60 y.o. M with hx esophageal dysmotlity and recurrent aspiration pneumonia, benzodiazepine dependence, opiate use disorder in relapse, hep C s/p treatment, hypothyroidism, recent left talus/calcaneous fracture and ORIF, OSA on CPAP and anxiety who presents with new dyspnea, rigors and fatigue and hypoxia.  Patient had calcaneous surgery in Nov.  Doing well after that until 2 weeks ago, developed cough, difficulty breathing, admitted for aspiration pneumonia.  GI were consulted due to prominent emesis/gagging symptoms and endoscopy was done that showed abnormal motility, spasticity.  It was dilated.  He was started on diltiazem 30 TID.  After discharge, he took the diltiazem and 5 more days Augmentin.  The diltiazem was poorly tolerated (caused fatigue), although he took it.  Finished the antibiotics.  About 5 days ago, his vomiting, "frothing", "Spitting up" symptoms started with most oral intake, especially liquids. He seemed okay until the 24 hours prior to admission when he started to appear tired to spouse, then his O2 level was down to 90%, and then he started to have rigors, so she brought him to the ER.   In the ER, CXR clear, but patient febrile, tachycardic, and hypoxic to 86% on room air.  Started on O2, antibiotics and Ellison fluids.            PRINCIPAL HOSPITAL DIAGNOSIS: Aspiration pneumonia    Discharge Diagnoses:    Sepsis and acute hypoxic respiratory due to aspiration pneumonia Patient presented with fever, tachycardia, dyspnea and hypoxia in setting of recurrent aspiration pneumonia.    Clearly his esophageal spasm is a large driver for recurrent aspiration.  PPI and H2RA seem to help a lot.  Diltiazem 30 TID doesn't seem to help at all but diltiazem 120 CD actually does.  Lying down after eating is also a frequent problem.  NOTE: Patient's symptoms have escalated in last 6 weeks since ankle surgery; coincidentally in that time, he has had several new prescriptions for opiates, despite being previously in remission from opiate use dependence.  ALSO: query if Xanax worsens symptoms, as he was noted here to be frequently sleeping, lying down, and also to report short term memory loss.  Treated here with Rocephin.  Patient now mentating at baseline, taking orals.  Temp < 100 F, heart rate < 100bpm, RR < 24, SpO2 at baseline.   Stable for discharge on cefdinir to complete 7 days.    Esophageal dysmotility Follow up with Christian Ellison.  Continue H2RA, PPI.  Transition back to diltiazem 120 CD daily.   Hypothyroidism Continue levothyroxine  Anxiety and benzodiazepine dependence  Sleep apnea CPAP at night  Tachycardia Repeat ECG reviewed, showed normal sinus rhythm.            Discharge Instructions  Discharge Instructions    Diet - low sodium heart healthy   Complete by: As directed    Discharge instructions   Complete by:  As directed    For the pneumonia: take the antibiotic cefdinir 300 mg twice daily for 5 more days Follow up with your primary care doctor  For the esophagus symptoms: Take diltiazem 120 mg CD once daily for suppressing spasms (relaxing the esophagus) Stay upright after you eat Take cimetidine 75 mg once or twice daily If you can't find  cimetidine/Tagamet, ranitidine is an alternative. Cimetidine can safely be used 200 mg twice daily and ranitidine can safely be used 150 mg twice daily  Take the pantoprazole in addition (this can be combined with cimetidine/Tagamet or ranitidine/Zantac)  Lastly, try to reduce how much you take Xanax and DON'T take oxycodone.  Both of these medicines (especially the combination of them) will cause you to be sleepy, and maybe not be aware when you accidentally reflux up into your lungs   Increase activity slowly   Complete by: As directed      Allergies as of 08/17/2020   No Known Allergies     Medication List    TAKE these medications   acetaminophen 325 MG tablet Commonly known as: TYLENOL Take 2 tablets (650 mg total) by mouth every 6 (six) hours as needed for mild pain (or Fever >/= 101).   ALPRAZolam 1 MG tablet Commonly known as: XANAX Take 1 mg by mouth every 6 (six) hours as needed for anxiety.   aspirin EC 81 MG tablet Take 1 tablet (81 mg total) by mouth daily with breakfast. What changed: when to take this   cefdinir 300 MG capsule Commonly known as: OMNICEF Take 1 capsule (300 mg total) by mouth 2 (two) times daily.   diltiazem 120 MG 24 hr capsule Commonly known as: DILACOR XR Take 1 capsule (120 mg total) by mouth daily.   Glucosamine HCl 1000 MG Tabs Take 1,000 mg by mouth at bedtime.   levothyroxine 25 MCG tablet Commonly known as: SYNTHROID Take 25 mcg by mouth daily.   multivitamin with minerals Tabs tablet Take 1 tablet by mouth daily. 50+   pantoprazole 40 MG tablet Commonly known as: Protonix Take 1 tablet (40 mg total) by mouth 2 (two) times daily.   TAGAMET PO Take 75 mg by mouth daily as needed (if he feels his food coming up after meal).       No Known Allergies  Consultations:  Gastroenterology   Procedures/Studies: CT Angio Chest PE W/Cm &/Or Wo Cm  Result Date: 08/05/2020 CLINICAL DATA:  Chest pain and shortness of breath.  EXAM: CT ANGIOGRAPHY CHEST WITH CONTRAST TECHNIQUE: Multidetector CT imaging of the chest was performed using the standard protocol during bolus administration of intravenous contrast. Multiplanar CT image reconstructions and MIPs were obtained to evaluate the vascular anatomy. CONTRAST:  100mL OMNIPAQUE IOHEXOL 350 MG/ML SOLN COMPARISON:  06/10/2019 FINDINGS: Cardiovascular: Contrast injection is sufficient to demonstrate satisfactory opacification of the pulmonary arteries to the segmental level. There is no pulmonary embolus or evidence of right heart strain. The size of the main pulmonary artery is normal. Heart size is normal, with no pericardial effusion. The course and caliber of the aorta are normal. There is no atherosclerotic calcification. Opacification decreased due to pulmonary arterial phase contrast bolus timing. Mediastinum/Nodes: --there are mildly enlarged mediastinal lymph nodes. --there are mildly enlarged hilar lymph nodes. For example there is a right hilar lymph node measuring approximately 1.5 cm (axial series 4, image 44). This has increased in size since the prior study. -- No axillary lymphadenopathy. --there are few small but mildly enlarged  left supraclavicular lymph nodes that are stable from prior study. --the esophagus is patulous and fluid-filled to the level of the upper thorax. This places the patient at risk for aspiration. -the thyroid gland is unremarkable. Lungs/Pleura: There are innumerable ground-glass airspace opacities in addition to multiple reticulonodular airspace opacities. These are similar in appearance to prior study but have progressed significantly. There is new extensive bronchial wall thickening and mucus plugging involving primarily the right lower lobe and to a lesser degree the left lower lobe. There is some debris within the patient's trachea. There is no pneumothorax or large pleural effusion. Upper Abdomen: Contrast bolus timing is not optimized for  evaluation of the abdominal organs. The visualized portions of the organs of the upper abdomen are normal. Musculoskeletal: No chest wall abnormality. No bony spinal canal stenosis. Review of the MIP images confirms the above findings. IMPRESSION: 1. No acute pulmonary embolism. 2. Persistent bilateral ground-glass airspace opacities with reticulonodular airspace opacities as was described in the patient's CT from 2020. However, these have progressed since prior study. In addition, there is new bronchial wall thickening and airway plugging involving the right lower lobe. Findings may be secondary to an atypical infectious process. However, aspiration is also within the differential diagnosis given the patient's dilated esophagus. 3. Patulous, dilated esophagus that is fluid-filled to the level of the upper thorax. This places the patient at risk for aspiration. Correlation for causes of a dilated esophagus is recommended. 4. Enlarged mediastinal and hilar lymph nodes, likely reactive. Electronically Signed   By: Katherine Mantle M.D.   On: 08/05/2020 16:26   DG Chest Port 1 View  Result Date: 08/16/2020 CLINICAL DATA:  Fever EXAM: PORTABLE CHEST 1 VIEW COMPARISON:  August 05, 2020 FINDINGS: The heart size and mediastinal contours are within normal limits. Mildly increased interstitial markings seen at both lung bases which was seen on prior exam. No new airspace consolidation or pleural effusion. The visualized skeletal structures are unremarkable. IMPRESSION: Mildly increased interstitial markings at both lung bases as on prior exam which could be due to chronic lung changes or inflammatory process. Electronically Signed   By: Jonna Clark M.D.   On: 08/16/2020 03:23   DG Chest Port 1 View  Result Date: 08/05/2020 CLINICAL DATA:  Questionable sepsis. EXAM: PORTABLE CHEST 1 VIEW COMPARISON:  Chest x-ray 07/13/2020, 04/21/2020. FINDINGS: Mediastinum and hilar structures normal. Cardiomegaly. Persistent  bilateral interstitial prominence noted suggesting pneumonitis. Mild right base atelectasis. No pleural effusion or pneumothorax. Degenerative change thoracic spine. IMPRESSION: 1. Cardiomegaly. 2. Persistent bilateral interstitial prominence noted suggesting pneumonitis. Mild right base atelectasis. Electronically Signed   By: Maisie Fus  Register   On: 08/05/2020 14:22       Subjective: Feeling well.  No confusion, fever.  No vomiting.  Appetite good.  No dyspnea or chest pain.  Ambulating without symptoms.  Discharge Exam: Vitals:   08/17/20 1037 08/17/20 1254  BP: 117/78   Pulse: 72   Resp: 16   Temp: 98.2 F (36.8 C)   SpO2: 94% 92%   Vitals:   08/17/20 0900 08/17/20 0911 08/17/20 1037 08/17/20 1254  BP:   117/78   Pulse:   72   Resp:   16   Temp:   98.2 F (36.8 C)   TempSrc:   Oral   SpO2: (!) 87% 92% 94% 92%  Weight:      Height:        General: Pt is alert, awake, not in acute distress Cardiovascular: RRR, nl  S1-S2, no murmurs appreciated.   No LE edema.   Respiratory: Normal respiratory rate and rhythm.  CTAB without rales or wheezes. Abdominal: Abdomen soft and non-tender.  No distension or HSM.   Neuro/Psych: Strength symmetric in upper and lower extremities.  Judgment and insight appear normal.   The results of significant diagnostics from this hospitalization (including imaging, microbiology, ancillary and laboratory) are listed below for reference.     Microbiology: Recent Results (from the past 240 hour(s))  Culture, blood (Routine x 2)     Status: None (Preliminary result)   Collection Time: 08/16/20  2:42 AM   Specimen: BLOOD  Result Value Ref Range Status   Specimen Description BLOOD LEFT ANTECUBITAL  Final   Special Requests   Final    BOTTLES DRAWN AEROBIC AND ANAEROBIC Blood Culture adequate volume   Culture   Final    NO GROWTH 1 DAY Performed at [facility], [address] [place], [place] [address]    Report Status PENDING  Incomplete   Resp Panel by RT-PCR (Flu A&B, Covid) Nasopharyngeal Swab     Status: None   Collection Time: 08/16/20  2:57 AM   Specimen: Nasopharyngeal Swab; Nasopharyngeal(NP) swabs in vial transport medium  Result Value Ref Range Status   SARS Coronavirus 2 by RT PCR NEGATIVE NEGATIVE Final    Comment: (NOTE) SARS-CoV-2 target nucleic acids are NOT DETECTED.  The SARS-CoV-2 RNA is generally detectable in upper respiratory specimens during the acute phase of infection. The lowest concentration of SARS-CoV-2 viral copies this assay can detect is 138 copies/mL. A negative result does not preclude SARS-Cov-2 infection and should not be used as the sole basis for treatment or other patient management decisions. A negative result may occur with  improper specimen collection/handling, submission of specimen other than nasopharyngeal swab, presence of viral mutation(s) within the areas targeted by this assay, and inadequate number of viral copies(<138 copies/mL). A negative result must be combined with clinical observations, patient history, and epidemiological information. The expected result is Negative.  Fact Sheet for Patients:  BloggerCourse.com  Fact Sheet for Healthcare Providers:  SeriousBroker.it  This test is no t yet approved or cleared by the [place] FDA and  has been authorized for detection and/or diagnosis of SARS-CoV-2 by FDA under an Emergency Use Authorization (EUA). This EUA will remain  in effect (meaning this test can be used) for the duration of the COVID-19 declaration under Section 564(b)(1) of the Act, 21 U.S.C.section 360bbb-3(b)(1), unless the authorization is terminated  or revoked sooner.       Influenza A by PCR NEGATIVE NEGATIVE Final   Influenza B by PCR NEGATIVE NEGATIVE Final    Comment: (NOTE) The Xpert Xpress SARS-CoV-2/FLU/RSV plus assay is intended as an aid in the diagnosis of influenza from  Nasopharyngeal swab specimens and should not be used as a sole basis for treatment. Nasal washings and aspirates are unacceptable for Xpert Xpress SARS-CoV-2/FLU/RSV testing.  Fact Sheet for Patients: BloggerCourse.com  Fact Sheet for Healthcare Providers: SeriousBroker.it  This test is not yet approved or cleared by the [place] FDA and has been authorized for detection and/or diagnosis of SARS-CoV-2 by FDA under an Emergency Use Authorization (EUA). This EUA will remain in effect (meaning this test can be used) for the duration of the COVID-19 declaration under Section 564(b)(1) of the Act, 21 U.S.C. section 360bbb-3(b)(1), unless the authorization is terminated or revoked.  Performed at [facility], [address] [place], [place] [address]  Culture, blood (Routine x 2)     Status: None (Preliminary result)   Collection Time: 08/16/20  3:30 AM   Specimen: BLOOD LEFT FOREARM  Result Value Ref Range Status   Specimen Description BLOOD LEFT FOREARM  Final   Special Requests   Final    BOTTLES DRAWN AEROBIC AND ANAEROBIC Blood Culture results may not be optimal due to an excessive volume of blood received in culture bottles   Culture   Final    NO GROWTH 1 DAY Performed at [facility], [address], [place], [place] [address]    Report Status PENDING  Incomplete  Urine culture     Status: None   Collection Time: 08/16/20  4:59 AM   Specimen: In/Out Cath Urine  Result Value Ref Range Status   Specimen Description   Final    IN/OUT CATH URINE Performed at [facility], [address] [place], [place] [address]    Special Requests   Final    NONE Performed at [facility], [address] [place], [place] [address]    Culture   Final    NO GROWTH Performed at [facility], [address], [place], [place] [address]    Report Status 08/17/2020 FINAL  Final     Labs: BNP (last 3 results) Recent Labs     10/29/19 1736 04/21/20 1322  BNP 29.0 23.0   Basic Metabolic Panel: Recent Labs  Lab 08/16/20 0242 08/17/20 0625  NA 137 139  K 3.9 3.6  CL 99 103  CO2 28 28  GLUCOSE 116* 134*  BUN 11 11  CREATININE 0.83 0.67  CALCIUM 8.5* 8.4*   Liver Function Tests: Recent Labs  Lab 08/16/20 0242  AST 19  ALT 11  ALKPHOS 63  BILITOT 0.4  PROT 7.3  ALBUMIN 3.8   No results for input(s): LIPASE, AMYLASE in the last 168 hours. No results for input(s): AMMONIA in the last 168 hours. CBC: Recent Labs  Lab 08/16/20 0242 08/17/20 0625  WBC 10.4 4.3  NEUTROABS 8.8*  --   HGB 12.3* 10.4*  HCT 39.1 33.2*  MCV 90.1 92.0  PLT 207 175   Cardiac Enzymes: No results for input(s): CKTOTAL, CKMB, CKMBINDEX, TROPONINI in the last 168 hours. BNP: Invalid input(s): POCBNP CBG: No results for input(s): GLUCAP in the last 168 hours. D-Dimer No results for input(s): DDIMER in the last 72 hours. Hgb A1c No results for input(s): HGBA1C in the last 72 hours. Lipid Profile No results for input(s): CHOL, HDL, LDLCALC, TRIG, CHOLHDL, LDLDIRECT in the last 72 hours. Thyroid function studies No results for input(s): TSH, T4TOTAL, T3FREE, THYROIDAB in the last 72 hours.  Invalid input(s): FREET3 Anemia work up No results for input(s): VITAMINB12, FOLATE, FERRITIN, TIBC, IRON, RETICCTPCT in the last 72 hours. Urinalysis    Component Value Date/Time   COLORURINE YELLOW 08/16/2020 0459   APPEARANCEUR CLEAR 08/16/2020 0459   LABSPEC 1.013 08/16/2020 0459   PHURINE 6.0 08/16/2020 0459   GLUCOSEU NEGATIVE 08/16/2020 0459   HGBUR SMALL (A) 08/16/2020 0459   BILIRUBINUR NEGATIVE 08/16/2020 0459   KETONESUR NEGATIVE 08/16/2020 0459   PROTEINUR NEGATIVE 08/16/2020 0459   NITRITE NEGATIVE 08/16/2020 0459   LEUKOCYTESUR NEGATIVE 08/16/2020 0459   Sepsis Labs Invalid input(s): PROCALCITONIN,  WBC,  LACTICIDVEN Microbiology Recent Results (from the past 240 hour(s))  Culture, blood (Routine x 2)      Status: None (Preliminary result)   Collection Time: 08/16/20  2:42 AM  Specimen: BLOOD  Result Value Ref Range Status   Specimen Description BLOOD LEFT ANTECUBITAL  Final   Special Requests   Final    BOTTLES DRAWN AEROBIC AND ANAEROBIC Blood Culture adequate volume   Culture   Final    NO GROWTH 1 DAY Performed at [facility], [address] [place], [place] [address]    Report Status PENDING  Incomplete  Resp Panel by RT-PCR (Flu A&B, Covid) Nasopharyngeal Swab     Status: None   Collection Time: 08/16/20  2:57 AM   Specimen: Nasopharyngeal Swab; Nasopharyngeal(NP) swabs in vial transport medium  Result Value Ref Range Status   SARS Coronavirus 2 by RT PCR NEGATIVE NEGATIVE Final    Comment: (NOTE) SARS-CoV-2 target nucleic acids are NOT DETECTED.  The SARS-CoV-2 RNA is generally detectable in upper respiratory specimens during the acute phase of infection. The lowest concentration of SARS-CoV-2 viral copies this assay can detect is 138 copies/mL. A negative result does not preclude SARS-Cov-2 infection and should not be used as the sole basis for treatment or other patient management decisions. A negative result may occur with  improper specimen collection/handling, submission of specimen other than nasopharyngeal swab, presence of viral mutation(s) within the areas targeted by this assay, and inadequate number of viral copies(<138 copies/mL). A negative result must be combined with clinical observations, patient history, and epidemiological information. The expected result is Negative.  Fact Sheet for Patients:  BloggerCourse.com  Fact Sheet for Healthcare Providers:  SeriousBroker.it  This test is no t yet approved or cleared by the [place] FDA and  has been authorized for detection and/or diagnosis of SARS-CoV-2 by FDA under an Emergency Use Authorization (EUA). This EUA will remain  in effect (meaning this  test can be used) for the duration of the COVID-19 declaration under Section 564(b)(1) of the Act, 21 U.S.C.section 360bbb-3(b)(1), unless the authorization is terminated  or revoked sooner.       Influenza A by PCR NEGATIVE NEGATIVE Final   Influenza B by PCR NEGATIVE NEGATIVE Final    Comment: (NOTE) The Xpert Xpress SARS-CoV-2/FLU/RSV plus assay is intended as an aid in the diagnosis of influenza from Nasopharyngeal swab specimens and should not be used as a sole basis for treatment. Nasal washings and aspirates are unacceptable for Xpert Xpress SARS-CoV-2/FLU/RSV testing.  Fact Sheet for Patients: BloggerCourse.com  Fact Sheet for Healthcare Providers: SeriousBroker.it  This test is not yet approved or cleared by the [place] FDA and has been authorized for detection and/or diagnosis of SARS-CoV-2 by FDA under an Emergency Use Authorization (EUA). This EUA will remain in effect (meaning this test can be used) for the duration of the COVID-19 declaration under Section 564(b)(1) of the Act, 21 U.S.C. section 360bbb-3(b)(1), unless the authorization is terminated or revoked.  Performed at [facility], [address], [place], [place] [address]   Culture, blood (Routine x 2)     Status: None (Preliminary result)   Collection Time: 08/16/20  3:30 AM   Specimen: BLOOD LEFT FOREARM  Result Value Ref Range Status   Specimen Description BLOOD LEFT FOREARM  Final   Special Requests   Final    BOTTLES DRAWN AEROBIC AND ANAEROBIC Blood Culture results may not be optimal due to an excessive volume of blood received in culture bottles   Culture   Final    NO GROWTH 1 DAY Performed at [facility], [address] [place], [place] [address]    Report Status PENDING  Incomplete  Urine culture     Status: None   Collection Time: 08/16/20  4:59 AM   Specimen: In/Out Cath Urine  Result Value Ref Range Status   Specimen Description    Final    IN/OUT CATH URINE Performed at [facility], [address], [place], [place] [address]    Special Requests   Final    NONE Performed at [facility], [address], [place], [place] [address]    Culture   Final    NO GROWTH Performed at [facility], [place] [address] [place], [place] [address]    Report Status 08/17/2020 FINAL  Final     Time coordinating discharge: 40 minutes The [place] controlled substances registry was reviewed for this patient       SIGNED:   Edwin Dada, MD  [facility] 08/17/2020, 1:35 PM

## 2020-08-17 NOTE — Progress Notes (Signed)
Subjective: Patient much improved this morning.  Eating a full lunch for me today.  Very conversive.  No longer somnolent.  Has no complaints for me this morning.  Objective: Vital signs in last 24 hours: Temp:  [98.2 F (36.8 C)-98.3 F (36.8 C)] 98.2 F (36.8 C) (01/01 1037) Pulse Rate:  [69-82] 72 (01/01 1037) Resp:  [13-23] 16 (01/01 1037) BP: (96-123)/(57-78) 117/78 (01/01 1037) SpO2:  [86 %-100 %] 92 % (01/01 1254)   General:   Alert and oriented, pleasant Head:  Normocephalic and atraumatic. Eyes:  No icterus, sclera clear. Conjuctiva pink.  Mouth:  Without lesions, mucosa pink and moist.  Neck:  Supple, without thyromegaly or masses.  Abdomen:  Bowel sounds present, soft, non-tender, non-distended. No HSM or hernias noted. No rebound or guarding. No masses appreciated  Msk:  Symmetrical without gross deformities. Normal posture. Pulses:  Normal pulses noted. Extremities:  Without clubbing or edema. Neurologic:  Alert and  oriented x4;  grossly normal neurologically. Skin:  Warm and dry, intact without significant lesions.  Cervical Nodes:  No significant cervical adenopathy. Psych:  Alert and cooperative. Normal mood and affect.  Intake/Output from previous day: No intake/output data recorded. Intake/Output this shift: No intake/output data recorded.  Lab Results: Recent Labs    08/16/20 0242 08/17/20 0625  WBC 10.4 4.3  HGB 12.3* 10.4*  HCT 39.1 33.2*  PLT 207 175   BMET Recent Labs    08/16/20 0242 08/17/20 0625  NA 137 139  K 3.9 3.6  CL 99 103  CO2 28 28  GLUCOSE 116* 134*  BUN 11 11  CREATININE 0.83 0.67  CALCIUM 8.5* 8.4*   LFT Recent Labs    08/16/20 0242  PROT 7.3  ALBUMIN 3.8  AST 19  ALT 11  ALKPHOS 63  BILITOT 0.4   PT/INR Recent Labs    08/16/20 0242  LABPROT 13.4  INR 1.1   Hepatitis Panel No results for input(s): HEPBSAG, HCVAB, HEPAIGM, HEPBIGM in the last 72 hours.   Studies/Results: DG Chest Port 1 View  Result  Date: 08/16/2020 CLINICAL DATA:  Fever EXAM: PORTABLE CHEST 1 VIEW COMPARISON:  August 05, 2020 FINDINGS: The heart size and mediastinal contours are within normal limits. Mildly increased interstitial markings seen at both lung bases which was seen on prior exam. No new airspace consolidation or pleural effusion. The visualized skeletal structures are unremarkable. IMPRESSION: Mildly increased interstitial markings at both lung bases as on prior exam which could be due to chronic lung changes or inflammatory process. Electronically Signed   By: Jonna Clark M.D.   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  He recently underwent EGD last week with dilation with minimal improvement in his symptoms.  -Patient much improved today clinically.  Wishing to go home.  -Recommend transitioning patient back to extended release diltiazem as both patient and his wife states this works much better   -Maximize acid suppression with PPI therapy as well as H2 blockers -Close follow-up with Dr. Karilyn Cota in the outpatient setting.  He may benefit from consultation for myotomy, pneumatic dilation, or Botox. -Okay to discharge from GI standpoint.  We will sign off.  Please call with any questions or concerns.  Hennie Duos. Marletta Lor, D.O.  Gastroenterology and Hepatology [facility]   LOS: 1 day    08/17/2020, 1:17 PM

## 2020-08-17 NOTE — Progress Notes (Signed)
SLP Cancellation Note  Patient Details Name: [facility] MRN: [id number] DOB: 1961-06-25   Cancelled treatment:       Reason Eval/Treat Not Completed: Other (comment); Acknowledge order for BSE. SLP spoke with hospitalist, Dr. Maryfrances Bunnell. Will defer SLP evaluation until Monday for MBSS if this would help clinical picture. Pt with numerous admissions related to esophageal dysphagia and suspected post prandial aspiration events. He has been educated in the past regarding reflux and esophageal precautions. SLP will follow.  Thank you,  Havery Moros, CCC-SLP [phone]    Hettie Roselli 08/17/2020, 1:12 PM

## 2020-08-19 NOTE — Telephone Encounter (Signed)
Patient has appointment with Dr.Rehman on 09/05/2020.

## 2020-08-21 LAB — CULTURE, BLOOD (ROUTINE X 2)
Culture: NO GROWTH
Culture: NO GROWTH
Special Requests: ADEQUATE

## 2020-08-30 ENCOUNTER — Ambulatory Visit (INDEPENDENT_AMBULATORY_CARE_PROVIDER_SITE_OTHER): Payer: 59 | Admitting: Internal Medicine

## 2020-08-30 ENCOUNTER — Other Ambulatory Visit: Payer: Self-pay

## 2020-08-30 ENCOUNTER — Encounter: Payer: Self-pay | Admitting: Internal Medicine

## 2020-08-30 VITALS — BP 171/97 | HR 101 | Temp 101.2°F | Resp 18 | Ht 72.0 in | Wt 200.1 lb

## 2020-08-30 DIAGNOSIS — I10 Essential (primary) hypertension: Secondary | ICD-10-CM

## 2020-08-30 DIAGNOSIS — E039 Hypothyroidism, unspecified: Secondary | ICD-10-CM

## 2020-08-30 DIAGNOSIS — J690 Pneumonitis due to inhalation of food and vomit: Secondary | ICD-10-CM

## 2020-08-30 DIAGNOSIS — Z7689 Persons encountering health services in other specified circumstances: Secondary | ICD-10-CM

## 2020-08-30 DIAGNOSIS — Z9989 Dependence on other enabling machines and devices: Secondary | ICD-10-CM

## 2020-08-30 DIAGNOSIS — F1320 Sedative, hypnotic or anxiolytic dependence, uncomplicated: Secondary | ICD-10-CM

## 2020-08-30 DIAGNOSIS — K2100 Gastro-esophageal reflux disease with esophagitis, without bleeding: Secondary | ICD-10-CM | POA: Insufficient documentation

## 2020-08-30 DIAGNOSIS — F331 Major depressive disorder, recurrent, moderate: Secondary | ICD-10-CM | POA: Insufficient documentation

## 2020-08-30 DIAGNOSIS — K222 Esophageal obstruction: Secondary | ICD-10-CM

## 2020-08-30 DIAGNOSIS — F1011 Alcohol abuse, in remission: Secondary | ICD-10-CM | POA: Insufficient documentation

## 2020-08-30 DIAGNOSIS — G4733 Obstructive sleep apnea (adult) (pediatric): Secondary | ICD-10-CM

## 2020-08-30 DIAGNOSIS — F418 Other specified anxiety disorders: Secondary | ICD-10-CM

## 2020-08-30 DIAGNOSIS — Z01818 Encounter for other preprocedural examination: Secondary | ICD-10-CM | POA: Insufficient documentation

## 2020-08-30 DIAGNOSIS — Z8619 Personal history of other infectious and parasitic diseases: Secondary | ICD-10-CM

## 2020-08-30 MED ORDER — LEVOFLOXACIN 750 MG PO TABS: 750.0000 mg | ORAL_TABLET | Freq: Every day | ORAL | 0 refills | Status: DC

## 2020-08-30 NOTE — Assessment & Plan Note (Addendum)
Has been hospitalized multiple times for hypoxia related to aspiration pneumonia Recent admission about 2 weeks ago Was treated with Rocephin and cefdinir  Recent episode of regurgitation at home, currently febrile, but denies severe cough or dyspnea Started Levaquin for possible aspiration pneumonia Advised to go to ER if persistent fever, chills, dyspnea, chest pain or palpitations

## 2020-08-30 NOTE — Assessment & Plan Note (Signed)
On diltiazem Recently had EGD Follows up with GI

## 2020-08-30 NOTE — Assessment & Plan Note (Signed)
On Xanax 1 mg every 6 hours as needed Has been on benzodiazepine chronically, currently stable No recent episode of overdose

## 2020-08-30 NOTE — Patient Instructions (Signed)
Please start taking Levofloxacin as prescribed.  Please stay hydrated by taking at least 2 liters of fluid in a day.  Please take food as tolerated. Please stay upright while eating to avoid any risk of aspiration.  Okay to take Tylenol for fever, chills or myalgias up to 3 times in a day.

## 2020-08-30 NOTE — Assessment & Plan Note (Signed)
On Xanax 1 mg every 6 hours as needed Follows up with psychiatrist

## 2020-08-30 NOTE — Assessment & Plan Note (Signed)
Care established Previous chart reviewed History and medications reviewed with the patient 

## 2020-08-30 NOTE — Assessment & Plan Note (Signed)
BP Readings from Last 1 Encounters:  08/30/20 (!) 171/97   New-onset, has been told of borderline high BP in the past Currently has fever, could be related to ongoing infection Would treat infection first, and recheck BP in 6 weeks Counseled for compliance with the medications Advised DASH diet and moderate exercise/walking, at least 150 mins/week

## 2020-08-30 NOTE — Assessment & Plan Note (Signed)
Status post antiviral treatment Follows up with GI

## 2020-08-30 NOTE — Progress Notes (Signed)
New Patient Office Visit  Subjective:  Patient ID: [facility], male    DOB: 1961/06/13  Age: 60 y.o. MRN: [id number]  CC:  Chief Complaint  Patient presents with  . New Patient (Initial Visit)    New patient has been having trouble with aspiration pneumonia and sepsis he was in hospital recently and discharged     HPI [facility] is a 60 y.o.Mwith PMH of esophageal dysmotlity and recurrent aspiration pneumonia, benzodiazepine dependence, opiate use disorder in relapse, hep C s/p treatment, hypothyroidism, recent left talus/calcaneous fracture and ORIF, OSA on CPAP and anxiety who presents for establishing care.  He was recently in the hospital for treatment of aspiration pneumonia, where he was treated with Rocephin and outpatient cefdinir.  He has had recurrent aspiration pneumonia due to esophageal stricture/dysmotility.  His wife reports that he had an episode of regurgitation about 2 days ago at home, after which he has been having episodes of fever and intermittent cough.  He denies any dyspnea, chest pain or palpitations. His wife states he has become septic multiple times due to aspiration pneumonia in the past.  He follows up with Dr. Laural Golden for esophageal stricture/dysmotility and GERD.  He has a history of hep C, which was treated with antiviral therapy in the past.  He has a history of OSA, for which he uses CPAP regularly.  He is in the process of getting a new CPAP device.  He has a history of anxiety with depression, for which he follows up with psychiatrist.  He is on Xanax currently.  Chart review suggests history of polysubstance abuse and alcohol abuse in the past.  Patient has had 2 doses of COVID vaccine.    Past Medical History:  Diagnosis Date  . Anxiety   . Aspiration pneumonia ([place]) 05/26/2018   RLL  . Chronic pain   . Closed fracture of xiphoid process   . Esophageal stricture   . GERD (gastroesophageal reflux disease)   . 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   Marland Kitchen Opiate abuse, continuous ([place])   . Pulmonary edema 02/02/2018  . Sepsis due to pneumonia ([place]) 03/11/2019  . Sleep apnea    CPAP nightly    Past Surgical History:  Procedure Laterality Date  . BIOPSY  03/18/2018   Procedure: BIOPSY;  Surgeon: Rogene Houston, MD;  [facility];  Service: Endoscopy;;  gastric   . COLONOSCOPY N/A 11/22/2013   Dr. Laural Golden: Normal except for hemorrhoids.  Next colonoscopy 10 years.  . ESOPHAGEAL DILATION N/A 03/18/2018   Procedure: ESOPHAGEAL DILATION;  Surgeon: Rogene Houston, MD;  [facility];  Service: Endoscopy;  Laterality: N/A;  . ESOPHAGEAL DILATION N/A 08/07/2020   Procedure: ESOPHAGEAL DILATION;  Surgeon: Rogene Houston, MD;  [facility];  Service: Endoscopy;  Laterality: N/A;  . ESOPHAGEAL MANOMETRY N/A 06/15/2018   Procedure: ESOPHAGEAL MANOMETRY (EM);  Surgeon: Mauri Pole, MD;  Location: [facility];  Service: Endoscopy;  Laterality: N/A;  . ESOPHAGOGASTRODUODENOSCOPY (EGD) WITH PROPOFOL N/A 03/18/2018   Dr. Laural Golden: Benign-appearing mild esophageal stenosis proximal to the GE J, status post dilation.  2 cm hiatal hernia.  Gastritis, biopsies negative for H. pylori  . ESOPHAGOGASTRODUODENOSCOPY (EGD) WITH PROPOFOL N/A 08/07/2020   Procedure: ESOPHAGOGASTRODUODENOSCOPY (EGD) WITH PROPOFOL;  Surgeon: Rogene Houston, MD;  [facility];  Service: Endoscopy;  Laterality: N/A;  . Fracture Right Leg     Patient has  rod/screws in this leg  . ORIF CALCANEOUS FRACTURE Left 07/04/2020   Procedure: OPEN REDUCTION INTERNAL FIXATION (ORIF) LEFT TALUS AND CALCANEOUS FRACTURE, SUBTALAR JOINT ARTHROTOMY WITH REMOVAL OF LOOSE BODIES, TALONAVICULAR JOINT ARTHOTOMY WITH REMOVAL OF LOOSE BODIES;  Surgeon: Erle Crocker, MD;  Location: [facility];  Service: Orthopedics;  Laterality: Left;  . Rotor Cuff  2012   Right Shoulder    Family History  Problem Relation Age of  Onset  . Breast cancer Mother   . Kidney failure Mother   . Stroke Mother   . Colon cancer Father   . Bladder Cancer Father   . Prostate cancer Father   . Hypertension Sister   . Hypothyroidism Sister   . Healthy Sister   . Healthy Daughter   . Healthy Son   . Healthy Son     Social History   Socioeconomic History  . Marital status: Married    Spouse name: Not on file  . Number of children: Not on file  . Years of education: Not on file  . Highest education level: Not on file  Occupational History  . Occupation: unemployed  Tobacco Use  . Smoking status: Never Smoker  . Smokeless tobacco: Never Used  Vaping Use  . Vaping Use: Never used  Substance and Sexual Activity  . Alcohol use: Not Currently  . Drug use: Not Currently    Types: Benzodiazepines    Comment: opiates  . Sexual activity: Not on file  Other Topics Concern  . Not on file  Social History Narrative  . Not on file   Social Determinants of Health   Financial Resource Strain: Not on file  Food Insecurity: Not on file  Transportation Needs: Not on file  Physical Activity: Not on file  Stress: Not on file  Social Connections: Not on file  Intimate Partner Violence: Not on file    ROS Review of Systems  Constitutional: Positive for fatigue and fever. Negative for chills.  HENT: Negative for congestion and sore throat.   Eyes: Negative for pain and discharge.  Respiratory: Positive for cough. Negative for shortness of breath.   Cardiovascular: Negative for chest pain and palpitations.  Gastrointestinal: Negative for constipation, diarrhea, nausea and vomiting.  Endocrine: Negative for polydipsia and polyuria.  Genitourinary: Negative for dysuria and hematuria.  Musculoskeletal: Negative for neck pain and neck stiffness.  Skin: Negative for rash.  Neurological: Negative for dizziness, weakness, numbness and headaches.  Psychiatric/Behavioral: Positive for sleep disturbance. Negative for suicidal  ideas. The patient is nervous/anxious.     Objective:   Today's Vitals: BP (!) 171/97 (BP Location: Right Arm, Patient Position: Sitting, Cuff Size: Normal)   Pulse (!) 101   Temp (!) 101.2 F (38.4 C) (Oral)   Resp 18   Ht 6' (1.829 m)   Wt 200 lb 1.9 oz (90.8 kg)   SpO2 97%   BMI 27.14 kg/m   Physical Exam Vitals reviewed.  Constitutional:      General: He is not in acute distress.    Appearance: He is not diaphoretic.  HENT:     Head: Normocephalic and atraumatic.     Nose: Nose normal.     Mouth/Throat:     Mouth: Mucous membranes are moist.  Eyes:     General: No scleral icterus.    Extraocular Movements: Extraocular movements intact.     Pupils: Pupils are equal, round, and reactive to light.  Cardiovascular:     Rate and Rhythm: Normal rate  and regular rhythm.     Pulses: Normal pulses.     Heart sounds: Normal heart sounds. No murmur heard.   Pulmonary:     Breath sounds: No wheezing or rales.     Comments: Decreased breath sounds on the right lower lung fields Abdominal:     Palpations: Abdomen is soft.     Tenderness: There is no abdominal tenderness.  Musculoskeletal:     Cervical back: Neck supple. No tenderness.     Right lower leg: No edema.     Left lower leg: No edema.  Skin:    General: Skin is warm.     Findings: No rash.  Neurological:     General: No focal deficit present.     Mental Status: He is alert and oriented to person, place, and time.  Psychiatric:        Mood and Affect: Mood normal.        Behavior: Behavior normal.     Assessment & Plan:   Problem List Items Addressed This Visit      Cardiovascular and Mediastinum   Essential hypertension    BP Readings from Last 1 Encounters:  08/30/20 (!) 171/97   New-onset, has been told of borderline high BP in the past Currently has fever, could be related to ongoing infection Would treat infection first, and recheck BP in 6 weeks Counseled for compliance with the  medications Advised DASH diet and moderate exercise/walking, at least 150 mins/week         Respiratory   Recurrent aspiration pneumonia ([place])    Has been hospitalized multiple times for hypoxia related to aspiration pneumonia Recent admission about 2 weeks ago Was treated with Rocephin and cefdinir  Recent episode of regurgitation at home, currently febrile, but denies severe cough or dyspnea Started Levaquin for possible aspiration pneumonia Advised to go to ER if persistent fever, chills, dyspnea, chest pain or palpitations      Relevant Medications   levofloxacin (LEVAQUIN) 750 MG tablet   Pneumonia   Relevant Medications   levofloxacin (LEVAQUIN) 750 MG tablet   OSA on CPAP     Digestive   Hx of hepatitis C    Status post antiviral treatment Follows up with GI      Esophageal stricture/stenosis/esophageal dysmotility with recurrent aspiration    On diltiazem Recently had EGD Follows up with GI      Gastroesophageal reflux disease with esophagitis    On pantoprazole Cimetidine as needed Follows up with GI        Endocrine   Hypothyroidism (Chronic)    On levothyroxine 25 mcg daily Check TSH Reports family h/o thyroid cancer        Other   Anxiety with depression    On Xanax 1 mg every 6 hours as needed Follows up with psychiatrist      Benzodiazepine dependence ([place])    On Xanax 1 mg every 6 hours as needed Has been on benzodiazepine chronically, currently stable No recent episode of overdose      Relevant Medications   levofloxacin (LEVAQUIN) 750 MG tablet   Encounter to establish care - Primary    Care established Previous chart reviewed History and medications reviewed with the patient      Relevant Orders   CBC with Differential   CMP14+EGFR   Hemoglobin A1c   TSH + free T4      Outpatient Encounter Medications as of 08/30/2020  Medication Sig  . acetaminophen (TYLENOL) 325 MG  tablet Take 2 tablets (650 mg total) by mouth every 6  (six) hours as needed for mild pain (or Fever >/= 101).  Marland Kitchen ALPRAZolam (XANAX) 1 MG tablet Take 1 mg by mouth every 6 (six) hours as needed for anxiety.   Marland Kitchen aspirin EC 81 MG tablet Take 1 tablet (81 mg total) by mouth daily with breakfast. (Patient taking differently: Take 81 mg by mouth at bedtime.)  . Cimetidine (TAGAMET PO) Take 75 mg by mouth daily as needed (if he feels his food coming up after meal).  Marland Kitchen diltiazem (DILACOR XR) 120 MG 24 hr capsule Take 1 capsule (120 mg total) by mouth daily.  . Glucosamine HCl 1000 MG TABS Take 1,000 mg by mouth at bedtime.  Marland Kitchen levofloxacin (LEVAQUIN) 750 MG tablet Take 1 tablet (750 mg total) by mouth daily.  Marland Kitchen levothyroxine (SYNTHROID, LEVOTHROID) 25 MCG tablet Take 25 mcg by mouth daily.  . Multiple Vitamin (MULTIVITAMIN WITH MINERALS) TABS tablet Take 1 tablet by mouth daily. 50+  . pantoprazole (PROTONIX) 40 MG tablet Take 1 tablet (40 mg total) by mouth 2 (two) times daily.  . [DISCONTINUED] cefdinir (OMNICEF) 300 MG capsule Take 1 capsule (300 mg total) by mouth 2 (two) times daily. (Patient not taking: Reported on 08/30/2020)   No facility-administered encounter medications on file as of 08/30/2020.    Follow-up: Return in about 6 weeks (around 10/11/2020).   Lindell Spar, MD

## 2020-08-30 NOTE — Assessment & Plan Note (Signed)
On levothyroxine 25 mcg daily Check TSH Reports family h/o thyroid cancer

## 2020-08-30 NOTE — Assessment & Plan Note (Signed)
On pantoprazole Cimetidine as needed Follows up with GI

## 2020-08-31 LAB — CMP14+EGFR
ALT: 12 IU/L (ref 0–44)
AST: 22 IU/L (ref 0–40)
Albumin/Globulin Ratio: 1.8 (ref 1.2–2.2)
Albumin: 4.9 g/dL (ref 3.8–4.9)
Alkaline Phosphatase: 78 IU/L (ref 44–121)
BUN/Creatinine Ratio: 17 (ref 9–20)
BUN: 14 mg/dL (ref 6–24)
Bilirubin Total: 0.4 mg/dL (ref 0.0–1.2)
CO2: 24 mmol/L (ref 20–29)
Calcium: 9.5 mg/dL (ref 8.7–10.2)
Chloride: 101 mmol/L (ref 96–106)
Creatinine, Ser: 0.82 mg/dL (ref 0.76–1.27)
GFR calc Af Amer: 113 mL/min/{1.73_m2} (ref >59)
GFR calc non Af Amer: 97 mL/min/{1.73_m2} (ref >59)
Globulin, Total: 2.7 g/dL (ref 1.5–4.5)
Glucose: 116 mg/dL — ABNORMAL HIGH (ref 65–99)
Potassium: 4.8 mmol/L (ref 3.5–5.2)
Sodium: 141 mmol/L (ref 134–144)
Total Protein: 7.6 g/dL (ref 6.0–8.5)

## 2020-08-31 LAB — CBC WITH DIFFERENTIAL/PLATELET
Basophils Absolute: 0 10*3/uL (ref 0.0–0.2)
Basos: 0 %
EOS (ABSOLUTE): 0.2 10*3/uL (ref 0.0–0.4)
Eos: 2 %
Hematocrit: 41.2 % (ref 37.5–51.0)
Hemoglobin: 13.5 g/dL (ref 13.0–17.7)
Immature Grans (Abs): 0 10*3/uL (ref 0.0–0.1)
Immature Granulocytes: 0 %
Lymphocytes Absolute: 0.8 10*3/uL (ref 0.7–3.1)
Lymphs: 7 %
MCH: 28.4 pg (ref 26.6–33.0)
MCHC: 32.8 g/dL (ref 31.5–35.7)
MCV: 87 fL (ref 79–97)
Monocytes Absolute: 0.6 10*3/uL (ref 0.1–0.9)
Monocytes: 5 %
Neutrophils Absolute: 10.1 10*3/uL — ABNORMAL HIGH (ref 1.4–7.0)
Neutrophils: 86 %
Platelets: 242 10*3/uL (ref 150–450)
RBC: 4.76 x10E6/uL (ref 4.14–5.80)
RDW: 13.4 % (ref 11.6–15.4)
WBC: 11.7 10*3/uL — ABNORMAL HIGH (ref 3.4–10.8)

## 2020-08-31 LAB — TSH+FREE T4
Free T4: 1.39 ng/dL (ref 0.82–1.77)
TSH: 4.33 u[IU]/mL (ref 0.450–4.500)

## 2020-08-31 LAB — HEMOGLOBIN A1C
Est. average glucose Bld gHb Est-mCnc: 105 mg/dL
Hgb A1c MFr Bld: 5.3 % (ref 4.8–5.6)

## 2020-09-01 ENCOUNTER — Encounter: Payer: Self-pay | Admitting: Internal Medicine

## 2020-09-05 ENCOUNTER — Other Ambulatory Visit: Payer: Self-pay

## 2020-09-05 ENCOUNTER — Telehealth (INDEPENDENT_AMBULATORY_CARE_PROVIDER_SITE_OTHER): Payer: 59 | Admitting: Internal Medicine

## 2020-09-05 ENCOUNTER — Encounter (INDEPENDENT_AMBULATORY_CARE_PROVIDER_SITE_OTHER): Payer: Self-pay | Admitting: Internal Medicine

## 2020-09-05 VITALS — Ht 72.0 in | Wt 190.0 lb

## 2020-09-05 DIAGNOSIS — K219 Gastro-esophageal reflux disease without esophagitis: Secondary | ICD-10-CM

## 2020-09-05 DIAGNOSIS — Z8619 Personal history of other infectious and parasitic diseases: Secondary | ICD-10-CM | POA: Diagnosis not present

## 2020-09-05 DIAGNOSIS — K224 Dyskinesia of esophagus: Secondary | ICD-10-CM

## 2020-09-05 NOTE — Patient Instructions (Addendum)
Referral to Dr. Fredrik Rigger of [facility].

## 2020-09-05 NOTE — Progress Notes (Signed)
Virtual Visit via Video Note  I connected with Christian Ellison on 09/05/20 at  11: 16 AM EST by a video enabled telemedicine application and verified that I am speaking with the correct person using two identifiers.  Location: Patient: home Provider: office   I discussed the limitations of evaluation and management by telemedicine and the availability of in person appointments. The patient expressed understanding and agreed to proceed. Patient's wife Christian Ellison also participated in this video visit.   History of Present Illness:  Patient is 60 year old Caucasian male who has a history of dysphagia secondary to esophageal motility disorder felt to be due to hypertensive LES with incomplete relaxation consistent with EG J outflow obstruction.  His manometry was in October 2019.  He has been treated with low-dose Cardizem/calcium channel blocker with some relief.  He was admitted to [facility] on 08/05/2020 for aspiration pneumonia and responded to IV antibiotics.  He underwent esophagogastroduodenoscopy on 08/07/2020 revealing spastic segment distal esophagus.  It was dilated with balloon to 20 mm without inducing mucosal disruption.  Patient was advised to continue diltiazem and chew food thoroughly and eat slowly. Patient states he has not had any episode of food impaction or regurgitation since his hospitalization.  His wife states at times he does cough when he drinks liquids.  Patient states occasionally regurgitate clear fluid.  He feels heartburn is well controlled with therapy.  He takes Tagamet OTC once in a while.  He feels it helps with his symptoms.  He was not recommended by me.  He feels dysphagia is worse when he is under stress he continues to complain of hearing gurgling sound in in his chest when he swallows.  His appetite is good.  He has not lost any weight. He asked me to review his blood work that he had last week.  I went over the blood work with him.  He also wants to be  retested for hepatitis C.  He was treated about 2 years ago with SVR. He is on low-dose aspirin but does not take other OTC NSAIDs. He usually takes alprazolam at bedtime and very rarely second or third dose. He is not having any side effects with diltiazem. Patient is willing to go to [facility] for further evaluation.    Current Outpatient Medications:  .  acetaminophen (TYLENOL) 325 MG tablet, Take 2 tablets (650 mg total) by mouth every 6 (six) hours as needed for mild pain (or Fever >/= 101)., Disp: 30 tablet, Rfl: 1 .  ALPRAZolam (XANAX) 1 MG tablet, Take 1 mg by mouth every 6 (six) hours as needed for anxiety. , Disp: , Rfl:  .  aspirin EC 81 MG tablet, Take 1 tablet (81 mg total) by mouth daily with breakfast. (Patient taking differently: Take 81 mg by mouth at bedtime.), Disp: 30 tablet, Rfl: 2 .  Cimetidine (TAGAMET PO), Take 75 mg by mouth daily as needed (if he feels his food coming up after meal)., Disp: , Rfl:  .  diltiazem (DILACOR XR) 120 MG 24 hr capsule, Take 1 capsule (120 mg total) by mouth daily., Disp: 30 capsule, Rfl: 3 .  Glucosamine HCl 1000 MG TABS, Take 1,000 mg by mouth at bedtime., Disp: , Rfl:  .  levofloxacin (LEVAQUIN) 750 MG tablet, Take 1 tablet (750 mg total) by mouth daily., Disp: 7 tablet, Rfl: 0 .  levothyroxine (SYNTHROID, LEVOTHROID) 25 MCG tablet, Take 25 mcg by mouth daily., Disp: , Rfl: 11 .  Multiple Vitamin (MULTIVITAMIN WITH MINERALS) TABS tablet, Take 1 tablet by mouth daily. 50+, Disp: , Rfl:  .  pantoprazole (PROTONIX) 40 MG tablet, Take 1 tablet (40 mg total) by mouth 2 (two) times daily., Disp: 60 tablet, Rfl: 5  Observations/Objective: Patient reported his weight to be 190 pounds. He appeared to be at ease during video visit. He did not cough.  High-resolution esophageal manometry performed on 06/15/2018 at [facility]. summary of findings  Elevated resting LES pressure with incomplete relaxation, IRP 27.  8  mmHg (normal less than 15 mmHg). 10/10 swallows were peristaltic with normal distal latency and elevated distal contractile integral. No manometric evidence of hiatal hernia Elevated basal UES pressure with normal residual. Incomplete bolus clearance with all swallows. Findings consistent with EG J outflow obstruction.  Detailed report can be located under procedures in epic  Barium esophagogram on 05/29/2019  Obstruction at the level of GE junction with mucosal fold thickening but no discrete mass.  Extensive tertiary contractions.  Lab data from 08/30/2020 Bilirubin 0.4, AP 78, AST 22, ALT 12, total protein 7.6 and albumin 4.9. Serum calcium 9.5  WBC 11.7, H&H 13.5 and 41.2 and platelet count 240 2K  Assessment and Plan:  #1.  Esophageal motility disorder.  High-resolution esophageal manometry in October 2019 revealed elevated LES pressure with incomplete relaxation but normal pharyngeal peristalsis with poor bolus clearance.  Patient felt to have EGD outflow obstruction.  Incomplete symptomatic relief with calcium channel blocker.  I wonder if he would he would evolve into achalasia.  Since he has had multiple episodes of aspiration pneumonia further evaluation is warranted.  He may be a candidate for myotomy via POEM or laparoscopically. [facility]  #2.  Chronic GERD.  GERD symptoms are well controlled we will double dose PPI with occasional use of low-dose Cimetidine.  #3.  History of hepatitis C genotype Ia treated with Harvoni for 8 weeks with SVR.  Recent transaminases are normal. Patient will go to the lab for HCV RNA by PCR quantitative   Follow Up Instructions:  Office visit in 3 months. I discussed the assessment and treatment plan with the patient. The patient was provided an opportunity to ask questions and all were answered. The patient agreed with the plan and demonstrated an understanding of the instructions.   The  patient was advised to call back or seek an in-person evaluation if the symptoms worsen or if the condition fails to improve as anticipated.  I provided 30 minutes of non-face-to-face time during this encounter.   Lionel December, MD

## 2020-09-06 ENCOUNTER — Encounter: Payer: Self-pay | Admitting: Internal Medicine

## 2020-09-09 ENCOUNTER — Other Ambulatory Visit: Payer: Self-pay | Admitting: Internal Medicine

## 2020-09-09 DIAGNOSIS — J690 Pneumonitis due to inhalation of food and vomit: Secondary | ICD-10-CM

## 2020-09-09 DIAGNOSIS — J189 Pneumonia, unspecified organism: Secondary | ICD-10-CM

## 2020-09-09 MED ORDER — ALBUTEROL SULFATE HFA 108 (90 BASE) MCG/ACT IN AERS: 2.0000 | INHALATION_SPRAY | Freq: Four times a day (QID) | RESPIRATORY_TRACT | 2 refills | Status: DC | PRN

## 2020-09-09 MED ORDER — ALBUTEROL SULFATE HFA 108 (90 BASE) MCG/ACT IN AERS
2.0000 | INHALATION_SPRAY | Freq: Four times a day (QID) | RESPIRATORY_TRACT | 2 refills | Status: DC | PRN
Start: 2020-09-09 — End: 2021-01-16

## 2020-09-10 ENCOUNTER — Other Ambulatory Visit: Payer: Self-pay | Admitting: *Deleted

## 2020-09-10 MED ORDER — LEVOTHYROXINE SODIUM 25 MCG PO TABS: 25.0000 ug | ORAL_TABLET | Freq: Every day | ORAL | 1 refills | Status: DC

## 2020-09-11 NOTE — Telephone Encounter (Signed)
Pt advised of recommendation from dr patel to go to ER to be evaluated as all his symptoms would need further evaluation with verbal understanding

## 2020-09-12 ENCOUNTER — Other Ambulatory Visit: Payer: Self-pay

## 2020-09-12 ENCOUNTER — Emergency Department (HOSPITAL_COMMUNITY)
Admission: EM | Admit: 2020-09-12 | Discharge: 2020-09-12 | Discharge status: Against Medical Advice | Payer: 59 | Attending: Emergency Medicine | Admitting: Emergency Medicine

## 2020-09-12 ENCOUNTER — Encounter: Payer: Self-pay | Admitting: Internal Medicine

## 2020-09-12 ENCOUNTER — Encounter (HOSPITAL_COMMUNITY): Payer: Self-pay | Admitting: *Deleted

## 2020-09-12 ENCOUNTER — Emergency Department (HOSPITAL_COMMUNITY): Payer: 59

## 2020-09-12 DIAGNOSIS — Z7982 Long term (current) use of aspirin: Secondary | ICD-10-CM | POA: Insufficient documentation

## 2020-09-12 DIAGNOSIS — Z79899 Other long term (current) drug therapy: Secondary | ICD-10-CM | POA: Diagnosis not present

## 2020-09-12 DIAGNOSIS — I509 Heart failure, unspecified: Secondary | ICD-10-CM | POA: Insufficient documentation

## 2020-09-12 DIAGNOSIS — I110 Hypertensive heart disease with heart failure: Secondary | ICD-10-CM | POA: Diagnosis not present

## 2020-09-12 DIAGNOSIS — E039 Hypothyroidism, unspecified: Secondary | ICD-10-CM | POA: Insufficient documentation

## 2020-09-12 DIAGNOSIS — R509 Fever, unspecified: Secondary | ICD-10-CM | POA: Insufficient documentation

## 2020-09-12 MED ORDER — ALBUTEROL SULFATE HFA 108 (90 BASE) MCG/ACT IN AERS: 2.0000 | INHALATION_SPRAY | RESPIRATORY_TRACT | Status: DC | PRN

## 2020-09-12 NOTE — ED Triage Notes (Signed)
States he has had a fever prior to arrival. Dry non productive cough x 2 days

## 2020-09-12 NOTE — ED Provider Notes (Signed)
[facility] EMERGENCY DEPARTMENT Provider Note   CSN: [id number] Arrival date & time: 09/12/20  1658     History Chief Complaint  Patient presents with  . Fever    Christian Ellison is a 60 y.o. male with history of recurrent aspiration pneumonia secondary to esophageal strictures and spasticity who presents with concern for rigors 2 days ago at home, as well as fever.  Most recent admission (d/c on 1/1) secondary to rigors and fever in context of normal chest x-ray, was septic and hypoxic at that time. Was discharged home from admission on course of Cefdinir. No growth on blood cultures x 5 days at that time.  Patient recently completed course of Levaquin on 1/25, the day he started with symptoms.  States he was outside doing a lot of yard work, had an episode of regurgitation and felt that he may have aspirated.  That evening he had temperature of 101 F, rigors that "were practically shaking the legs off the bed frame".  Per chart review, patient's wife called primary care doctor, Dr. Allena Katz, 1/26 with concern for the patient with fever to 101 F, and SPO2 to 88% on room air with home pulse ox.  Patient was encouraged to present to the emergency department for further evaluation.  He delayed his presentation to the emergency department till today, as he felt that he did not need to come, however presents at the prompting of his wife.   I personally reviewed the patient's medical records.  He has history of hepatitis C status post treatment, hypertension, CHF, esophageal stricture with dysmotility, alcohol abuse, benzodiazepine and opiate dependence, GERD, OSA on CPAP, and recurrent aspiration pneumonia.   HPI     Past Medical History:  Diagnosis Date  . Anxiety   . Aspiration pneumonia (HCC) 05/26/2018   RLL  . Chronic pain   . Closed fracture of xiphoid process   . Esophageal stricture   . GERD (gastroesophageal reflux disease)   . 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   Marland Kitchen Opiate abuse, continuous (HCC)   . Pulmonary edema 02/02/2018  . Sepsis due to pneumonia (HCC) 03/11/2019  . Sleep apnea    CPAP nightly    Patient Active Problem List   Diagnosis Date Noted  . GERD (gastroesophageal reflux disease)   . Esophageal motility disorder   . Alcohol abuse, in remission 08/30/2020  . Gastroesophageal reflux disease with esophagitis 08/30/2020  . Moderate recurrent major depression (HCC) 08/30/2020  . Encounter to establish care 08/30/2020  . OSA on CPAP 08/30/2020  . Anxiety 06/12/2020  . Benzodiazepine dependence (HCC) 06/12/2020  . History of substance abuse (HCC) 06/12/2020  . Pneumonia 06/24/2019  . Hypothyroidism 03/11/2019  . Recurrent aspiration pneumonia (HCC)   . Essential hypertension 02/02/2018  . CHF (congestive heart failure) (HCC) 02/02/2018  . Dysphagia 02/02/2018  . Esophageal stricture/stenosis/esophageal dysmotility with recurrent aspiration 02/02/2018  . Esophageal stenosis 02/02/2018  . Transaminitis   . Hx of hepatitis C 01/17/2013  . Depression 01/17/2013  . Anxiety with depression 01/17/2013    Past Surgical History:  Procedure Laterality Date  . BIOPSY  03/18/2018   Procedure: BIOPSY;  Surgeon: Malissa Hippo, MD;  Location: [facility];  Service: Endoscopy;;  gastric   . COLONOSCOPY N/A 11/22/2013   Dr. Karilyn Cota: Normal except for hemorrhoids.  Next colonoscopy 10 years.  . ESOPHAGEAL DILATION N/A 03/18/2018   Procedure: ESOPHAGEAL DILATION;  Surgeon: Malissa Hippo, MD;  Location: [facility]  END[facility];  Service: Endoscopy;  Laterality: N/A;  . ESOPHAGEAL DILATION N/A 08/07/2020   Procedure: ESOPHAGEAL DILATION;  Surgeon: Malissa Hippo, MD;  Location: [facility];  Service: Endoscopy;  Laterality: N/A;  . ESOPHAGEAL MANOMETRY N/A 06/15/2018   Procedure: ESOPHAGEAL MANOMETRY (EM);  Surgeon: Napoleon Form, MD;  Location: [facility];  Service: Endoscopy;  Laterality: N/A;  . ESOPHAGOGASTRODUODENOSCOPY (EGD)  WITH PROPOFOL N/A 03/18/2018   Dr. Karilyn Cota: Benign-appearing mild esophageal stenosis proximal to the GE J, status post dilation.  2 cm hiatal hernia.  Gastritis, biopsies negative for H. pylori  . ESOPHAGOGASTRODUODENOSCOPY (EGD) WITH PROPOFOL N/A 08/07/2020   Procedure: ESOPHAGOGASTRODUODENOSCOPY (EGD) WITH PROPOFOL;  Surgeon: Malissa Hippo, MD;  Location: [facility];  Service: Endoscopy;  Laterality: N/A;  . Fracture Right Leg     Patient has rod/screws in this leg  . ORIF CALCANEOUS FRACTURE Left 07/04/2020   Procedure: OPEN REDUCTION INTERNAL FIXATION (ORIF) LEFT TALUS AND CALCANEOUS FRACTURE, SUBTALAR JOINT ARTHROTOMY WITH REMOVAL OF LOOSE BODIES, TALONAVICULAR JOINT ARTHOTOMY WITH REMOVAL OF LOOSE BODIES;  Surgeon: Terance Hart, MD;  Location: [facility] OR;  Service: Orthopedics;  Laterality: Left;  . Rotor Cuff  2012   Right Shoulder       Family History  Problem Relation Age of Onset  . Breast cancer Mother   . Kidney failure Mother   . Stroke Mother   . Colon cancer Father   . Bladder Cancer Father   . Prostate cancer Father   . Hypertension Sister   . Hypothyroidism Sister   . Healthy Sister   . Healthy Daughter   . Healthy Son   . Healthy Son     Social History   Tobacco Use  . Smoking status: Never Smoker  . Smokeless tobacco: Never Used  Vaping Use  . Vaping Use: Never used  Substance Use Topics  . Alcohol use: Not Currently  . Drug use: Not Currently    Types: Benzodiazepines    Comment: opiates    Home Medications Prior to Admission medications   Medication Sig Start Date End Date Taking? Authorizing Provider  acetaminophen (TYLENOL) 325 MG tablet Take 2 tablets (650 mg total) by mouth every 6 (six) hours as needed for mild pain (or Fever >/= 101). 05/17/19   Shon Hale, MD  albuterol (VENTOLIN HFA) 108 (90 Base) MCG/ACT inhaler Inhale 2 puffs into the lungs every 6 (six) hours as needed for wheezing or shortness of breath. 09/09/20   Anabel Halon, MD  ALPRAZolam Prudy Feeler) 1 MG tablet Take 1 mg by mouth every 6 (six) hours as needed for anxiety.  10/22/19   [provider]  aspirin EC 81 MG tablet Take 1 tablet (81 mg total) by mouth daily with breakfast. Patient taking differently: Take 81 mg by mouth at bedtime. 03/13/19   Shon Hale, MD  Cimetidine (TAGAMET PO) Take 75 mg by mouth daily as needed (if he feels his food coming up after meal).    [provider]  diltiazem (DILACOR XR) 120 MG 24 hr capsule Take 1 capsule (120 mg total) by mouth daily. 08/17/20   Danford, Earl Lites, MD  Glucosamine HCl 1000 MG TABS Take 1,000 mg by mouth at bedtime.    [provider]  levofloxacin (LEVAQUIN) 750 MG tablet Take 1 tablet (750 mg total) by mouth daily. 08/30/20   Anabel Halon, MD  levothyroxine (SYNTHROID) 25 MCG tablet Take 1 tablet (25 mcg total) by mouth daily.  09/10/20   Anabel Halon, MD  Multiple Vitamin (MULTIVITAMIN WITH MINERALS) TABS tablet Take 1 tablet by mouth daily. 50+    [provider]  pantoprazole (PROTONIX) 40 MG tablet Take 1 tablet (40 mg total) by mouth 2 (two) times daily. 08/07/20 08/07/21  Shon Hale, MD    Allergies    Patient has no known allergies.  Review of Systems   Review of Systems  Constitutional: Positive for chills.  HENT: Negative.   Eyes: Negative.   Respiratory: Negative.   Cardiovascular: Negative.   Gastrointestinal: Negative.   Genitourinary: Negative.   Musculoskeletal: Negative.   Skin: Negative.   Allergic/Immunologic: Negative.   Neurological: Negative.   Hematological: Negative.   Psychiatric/Behavioral: Negative.     Physical Exam Updated Vital Signs BP 122/69   Pulse 67   Temp 98.2 F (36.8 C) (Oral)   Resp 18   SpO2 90%   Physical Exam Vitals and nursing note reviewed.  HENT:     Head: Normocephalic and atraumatic.     Nose: Nose normal.     Mouth/Throat:     Mouth: Mucous membranes are moist.     Pharynx:  Oropharynx is clear. Uvula midline. No oropharyngeal exudate, posterior oropharyngeal erythema or uvula swelling.     Tonsils: No tonsillar exudate.  Eyes:     General: Lids are normal. Vision grossly intact.        Right eye: No discharge.        Left eye: No discharge.     Extraocular Movements: Extraocular movements intact.     Conjunctiva/sclera: Conjunctivae normal.     Pupils: Pupils are equal, round, and reactive to light.  Neck:     Trachea: Trachea and phonation normal.  Cardiovascular:     Rate and Rhythm: Normal rate and regular rhythm.     Pulses: Normal pulses.     Heart sounds: Normal heart sounds. No murmur heard.   Pulmonary:     Effort: Pulmonary effort is normal. No prolonged expiration or respiratory distress.     Breath sounds: Normal breath sounds. No wheezing or rales.  Chest:     Chest wall: No deformity, swelling, tenderness, crepitus or edema.  Abdominal:     General: Bowel sounds are normal. There is no distension.     Palpations: Abdomen is soft.     Tenderness: There is no abdominal tenderness. There is no guarding or rebound.  Musculoskeletal:        General: No deformity.     Cervical back: Normal range of motion and neck supple. No crepitus. No pain with movement, spinous process tenderness or muscular tenderness.     Right lower leg: No edema.     Left lower leg: No edema.  Lymphadenopathy:     Cervical: No cervical adenopathy.  Skin:    General: Skin is warm and dry.     Capillary Refill: Capillary refill takes less than 2 seconds.  Neurological:     General: No focal deficit present.     Mental Status: He is alert and oriented to person, place, and time.     Cranial Nerves: Cranial nerves are intact.     Sensory: Sensation is intact.     Motor: Motor function is intact.     Coordination: Coordination is intact.     Gait: Gait is intact.  Psychiatric:        Mood and Affect: Mood normal.     ED Results / Procedures / Treatments  Labs (all labs ordered are listed, but only abnormal results are displayed) Labs Reviewed  CBC WITH DIFFERENTIAL/PLATELET  BASIC METABOLIC PANEL    EKG Patient eloped prior to collection of EKG.   Radiology DG Chest 2 View  Result Date: 09/12/2020 CLINICAL DATA:  Shortness of breath EXAM: CHEST - 2 VIEW COMPARISON:  08/16/2020 FINDINGS: The heart size and mediastinal contours are within normal limits. Both lungs are clear. The visualized skeletal structures are unremarkable. IMPRESSION: No active cardiopulmonary disease. Electronically Signed   By: Jasmine Pang M.D.   On: 09/12/2020 18:28    Procedures Procedures   Medications Ordered in ED Medications  albuterol (VENTOLIN HFA) 108 (90 Base) MCG/ACT inhaler 2 puff (has no administration in time range)    ED Course  I have reviewed the triage vital signs and the nursing notes.  Pertinent labs & imaging results that were available during my care of the patient were reviewed by me and considered in my medical decision making (see chart for details).    MDM Rules/Calculators/A&P                         60 year old male with history of recurrent aspiration pneumonia secondary to esophageal dysmotility who presents with concern for 2 days of intermittent rigors and fever with T-max of 101.1 F at home.  Vital signs are normal on intake, 92% oxygen saturation on room air during my initial exam patient is resting comfortably in his hospital bed. Patient is afebrile. Cardiopulmonary exam is unremarkable, abdominal exam is benign.  Ambulatory O2 sat with this provider in the room: Patient maintained oxygen saturations greater than or equal to 94% on room air.  Given patient's history we will proceed with basic laboratory studies and chest x-ray.  Chest x-ray negative for acute cardiopulmonary disease.  Shortly after my initial examination of the patient, I was informed by nursing staff that he has eloped from the emergency department.   According to nursing staff, phlebotomy entered the room to draw the patient's blood, and patient was gathering his belongings and walked out of the department.  He may follow-up with his primary care doctor, or return to the emergency department at any time for evaluation and medical care.  This chart was dictated using voice recognition software, Dragon. Despite the best efforts of this provider to proofread and correct errors, errors may still occur which can change documentation meaning.  Final Clinical Impression(s) / ED Diagnoses Final diagnoses:  None    Rx / DC Orders ED Discharge Orders    None       Sherrilee Gilles 09/12/20 2006    Eber Hong, MD 09/13/20 [phone]

## 2020-09-12 NOTE — ED Notes (Signed)
Ben with lab went in to draw labs on pt., Romeo Apple states that pt had his belongings and told Romeo Apple he was leaving.  Reported that pt left the dept.

## 2020-10-08 ENCOUNTER — Emergency Department (HOSPITAL_COMMUNITY): Payer: 59

## 2020-10-08 ENCOUNTER — Encounter (HOSPITAL_COMMUNITY): Payer: Self-pay | Admitting: Emergency Medicine

## 2020-10-08 ENCOUNTER — Other Ambulatory Visit: Payer: Self-pay

## 2020-10-08 ENCOUNTER — Inpatient Hospital Stay (HOSPITAL_COMMUNITY)
Admission: EM | Admit: 2020-10-08 | Discharge: 2020-10-12 | DRG: 177 | Disposition: A | Discharge status: Home / Self Care | Payer: 59 | Attending: Internal Medicine | Admitting: Internal Medicine

## 2020-10-08 ENCOUNTER — Telehealth: Payer: Self-pay

## 2020-10-08 DIAGNOSIS — K2100 Gastro-esophageal reflux disease with esophagitis, without bleeding: Secondary | ICD-10-CM | POA: Diagnosis present

## 2020-10-08 DIAGNOSIS — Z823 Family history of stroke: Secondary | ICD-10-CM

## 2020-10-08 DIAGNOSIS — E039 Hypothyroidism, unspecified: Secondary | ICD-10-CM | POA: Diagnosis present

## 2020-10-08 DIAGNOSIS — R7989 Other specified abnormal findings of blood chemistry: Secondary | ICD-10-CM | POA: Diagnosis not present

## 2020-10-08 DIAGNOSIS — Z803 Family history of malignant neoplasm of breast: Secondary | ICD-10-CM

## 2020-10-08 DIAGNOSIS — J9601 Acute respiratory failure with hypoxia: Secondary | ICD-10-CM | POA: Diagnosis present

## 2020-10-08 DIAGNOSIS — Z8249 Family history of ischemic heart disease and other diseases of the circulatory system: Secondary | ICD-10-CM

## 2020-10-08 DIAGNOSIS — F419 Anxiety disorder, unspecified: Secondary | ICD-10-CM | POA: Diagnosis present

## 2020-10-08 DIAGNOSIS — Z20822 Contact with and (suspected) exposure to covid-19: Secondary | ICD-10-CM | POA: Diagnosis present

## 2020-10-08 DIAGNOSIS — Z79899 Other long term (current) drug therapy: Secondary | ICD-10-CM

## 2020-10-08 DIAGNOSIS — J690 Pneumonitis due to inhalation of food and vomit: Secondary | ICD-10-CM | POA: Diagnosis not present

## 2020-10-08 DIAGNOSIS — Z7982 Long term (current) use of aspirin: Secondary | ICD-10-CM

## 2020-10-08 DIAGNOSIS — K2289 Other specified disease of esophagus: Secondary | ICD-10-CM

## 2020-10-08 DIAGNOSIS — Z841 Family history of disorders of kidney and ureter: Secondary | ICD-10-CM

## 2020-10-08 DIAGNOSIS — Z79890 Hormone replacement therapy (postmenopausal): Secondary | ICD-10-CM

## 2020-10-08 DIAGNOSIS — Z8042 Family history of malignant neoplasm of prostate: Secondary | ICD-10-CM

## 2020-10-08 DIAGNOSIS — Z9989 Dependence on other enabling machines and devices: Secondary | ICD-10-CM

## 2020-10-08 DIAGNOSIS — F1320 Sedative, hypnotic or anxiolytic dependence, uncomplicated: Secondary | ICD-10-CM | POA: Diagnosis present

## 2020-10-08 DIAGNOSIS — Z8052 Family history of malignant neoplasm of bladder: Secondary | ICD-10-CM

## 2020-10-08 DIAGNOSIS — G8929 Other chronic pain: Secondary | ICD-10-CM | POA: Diagnosis present

## 2020-10-08 DIAGNOSIS — K222 Esophageal obstruction: Secondary | ICD-10-CM | POA: Diagnosis not present

## 2020-10-08 DIAGNOSIS — J189 Pneumonia, unspecified organism: Secondary | ICD-10-CM

## 2020-10-08 DIAGNOSIS — G4733 Obstructive sleep apnea (adult) (pediatric): Secondary | ICD-10-CM

## 2020-10-08 DIAGNOSIS — Z800 Family history of malignant neoplasm of digestive organs: Secondary | ICD-10-CM

## 2020-10-08 DIAGNOSIS — R4182 Altered mental status, unspecified: Secondary | ICD-10-CM | POA: Diagnosis not present

## 2020-10-08 LAB — AMMONIA: Ammonia: 25 umol/L (ref 9–35)

## 2020-10-08 LAB — CREATININE, SERUM
Creatinine, Ser: 0.73 mg/dL (ref 0.61–1.24)
GFR, Estimated: >60 mL/min (ref >60)

## 2020-10-08 LAB — URINALYSIS, ROUTINE W REFLEX MICROSCOPIC
Bacteria, UA: NONE SEEN
Bilirubin Urine: NEGATIVE
Glucose, UA: NEGATIVE mg/dL
Ketones, ur: NEGATIVE mg/dL
Leukocytes,Ua: NEGATIVE
Nitrite: NEGATIVE
Protein, ur: NEGATIVE mg/dL
Specific Gravity, Urine: 1.006 (ref 1.005–1.030)
pH: 6 (ref 5.0–8.0)

## 2020-10-08 LAB — CBC WITH DIFFERENTIAL/PLATELET
Abs Immature Granulocytes: 0.02 10*3/uL (ref 0.00–0.07)
Basophils Absolute: 0 10*3/uL (ref 0.0–0.1)
Basophils Relative: 0 %
Eosinophils Absolute: 0 10*3/uL (ref 0.0–0.5)
Eosinophils Relative: 1 %
HCT: 44.1 % (ref 39.0–52.0)
Hemoglobin: 14.5 g/dL (ref 13.0–17.0)
Immature Granulocytes: 0 %
Lymphocytes Relative: 7 %
Lymphs Abs: 0.4 10*3/uL — ABNORMAL LOW (ref 0.7–4.0)
MCH: 29 pg (ref 26.0–34.0)
MCHC: 32.9 g/dL (ref 30.0–36.0)
MCV: 88.2 fL (ref 80.0–100.0)
Monocytes Absolute: 0.3 10*3/uL (ref 0.1–1.0)
Monocytes Relative: 5 %
Neutro Abs: 4.7 10*3/uL (ref 1.7–7.7)
Neutrophils Relative %: 87 %
Platelets: 150 10*3/uL (ref 150–400)
RBC: 5 MIL/uL (ref 4.22–5.81)
RDW: 13.9 % (ref 11.5–15.5)
WBC: 5.4 10*3/uL (ref 4.0–10.5)
nRBC: 0 % (ref 0.0–0.2)

## 2020-10-08 LAB — COMPREHENSIVE METABOLIC PANEL
ALT: 17 U/L (ref 0–44)
AST: 21 U/L (ref 15–41)
Albumin: 4.4 g/dL (ref 3.5–5.0)
Alkaline Phosphatase: 63 U/L (ref 38–126)
Anion gap: 9 (ref 5–15)
BUN: 11 mg/dL (ref 6–20)
CO2: 27 mmol/L (ref 22–32)
Calcium: 9.1 mg/dL (ref 8.9–10.3)
Chloride: 100 mmol/L (ref 98–111)
Creatinine, Ser: 0.84 mg/dL (ref 0.61–1.24)
GFR, Estimated: >60 mL/min (ref >60)
Glucose, Bld: 120 mg/dL — ABNORMAL HIGH (ref 70–99)
Potassium: 3.6 mmol/L (ref 3.5–5.1)
Sodium: 136 mmol/L (ref 135–145)
Total Bilirubin: 0.8 mg/dL (ref 0.3–1.2)
Total Protein: 7.8 g/dL (ref 6.5–8.1)

## 2020-10-08 LAB — BLOOD GAS, VENOUS
Acid-Base Excess: 3.2 mmol/L — ABNORMAL HIGH (ref 0.0–2.0)
Bicarbonate: 25.9 mmol/L (ref 20.0–28.0)
FIO2: 21
O2 Saturation: 68.2 %
Patient temperature: 38.5
pCO2, Ven: 50.5 mmHg (ref 44.0–60.0)
pH, Ven: 7.368 (ref 7.250–7.430)
pO2, Ven: 42 mmHg (ref 32.0–45.0)

## 2020-10-08 LAB — APTT: aPTT: 24 seconds (ref 24–36)

## 2020-10-08 LAB — RAPID URINE DRUG SCREEN, HOSP PERFORMED
Amphetamines: NOT DETECTED
Barbiturates: NOT DETECTED
Benzodiazepines: POSITIVE — AB
Cocaine: NOT DETECTED
Opiates: NOT DETECTED
Tetrahydrocannabinol: NOT DETECTED

## 2020-10-08 LAB — RESP PANEL BY RT-PCR (FLU A&B, COVID) ARPGX2
Influenza A by PCR: NEGATIVE
Influenza B by PCR: NEGATIVE
SARS Coronavirus 2 by RT PCR: NEGATIVE

## 2020-10-08 LAB — CBC
HCT: 38.4 % — ABNORMAL LOW (ref 39.0–52.0)
Hemoglobin: 12.7 g/dL — ABNORMAL LOW (ref 13.0–17.0)
MCH: 29.1 pg (ref 26.0–34.0)
MCHC: 33.1 g/dL (ref 30.0–36.0)
MCV: 87.9 fL (ref 80.0–100.0)
Platelets: 154 10*3/uL (ref 150–400)
RBC: 4.37 MIL/uL (ref 4.22–5.81)
RDW: 13.8 % (ref 11.5–15.5)
WBC: 8.9 10*3/uL (ref 4.0–10.5)
nRBC: 0 % (ref 0.0–0.2)

## 2020-10-08 LAB — PROTIME-INR
INR: 1 (ref 0.8–1.2)
Prothrombin Time: 12.6 seconds (ref 11.4–15.2)

## 2020-10-08 LAB — ETHANOL: Alcohol, Ethyl (B): <10 mg/dL (ref <10)

## 2020-10-08 LAB — LACTIC ACID, PLASMA
Lactic Acid, Venous: 2.4 mmol/L (ref 0.5–1.9)
Lactic Acid, Venous: 1.9 mmol/L (ref 0.5–1.9)

## 2020-10-08 MED ORDER — ACETAMINOPHEN 650 MG RE SUPP: 650.0000 mg | Freq: Four times a day (QID) | RECTAL | Status: DC | PRN

## 2020-10-08 MED ORDER — ACETAMINOPHEN 325 MG PO TABS
650.0000 mg | ORAL_TABLET | Freq: Once | ORAL | Status: AC
  Administered 2020-10-08: 650 mg via ORAL
  Filled 2020-10-08: qty 2

## 2020-10-08 MED ORDER — IPRATROPIUM-ALBUTEROL 0.5-2.5 (3) MG/3ML IN SOLN
3.0000 mL | Freq: Three times a day (TID) | RESPIRATORY_TRACT | Status: DC
  Administered 2020-10-09: 3 mL via RESPIRATORY_TRACT
  Filled 2020-10-08: qty 3

## 2020-10-08 MED ORDER — SODIUM CHLORIDE 0.9 % IV SOLN
1.0000 g | Freq: Once | INTRAVENOUS | Status: AC
  Administered 2020-10-08: 1 g via INTRAVENOUS
  Filled 2020-10-08: qty 10

## 2020-10-08 MED ORDER — IPRATROPIUM-ALBUTEROL 0.5-2.5 (3) MG/3ML IN SOLN
3.0000 mL | Freq: Four times a day (QID) | RESPIRATORY_TRACT | Status: DC
  Administered 2020-10-08: 3 mL via RESPIRATORY_TRACT
  Filled 2020-10-08: qty 3

## 2020-10-08 MED ORDER — LACTATED RINGERS IV SOLN: INTRAVENOUS | Status: DC

## 2020-10-08 MED ORDER — AZITHROMYCIN 250 MG PO TABS
500.0000 mg | ORAL_TABLET | Freq: Once | ORAL | Status: DC
  Filled 2020-10-08: qty 2

## 2020-10-08 MED ORDER — ONDANSETRON HCL 4 MG/2ML IJ SOLN: 4.0000 mg | Freq: Four times a day (QID) | INTRAMUSCULAR | Status: DC | PRN

## 2020-10-08 MED ORDER — LACTATED RINGERS IV BOLUS
1600.0000 mL | Freq: Once | INTRAVENOUS | Status: AC
  Administered 2020-10-08: 1600 mL via INTRAVENOUS

## 2020-10-08 MED ORDER — ASPIRIN EC 81 MG PO TBEC
81.0000 mg | DELAYED_RELEASE_TABLET | Freq: Every day | ORAL | Status: DC
  Administered 2020-10-08 – 2020-10-11 (×3): 81 mg via ORAL
  Filled 2020-10-08 (×3): qty 1

## 2020-10-08 MED ORDER — PIPERACILLIN-TAZOBACTAM 3.375 G IVPB
3.3750 g | Freq: Three times a day (TID) | INTRAVENOUS | Status: DC
  Administered 2020-10-08 – 2020-10-12 (×11): 3.375 g via INTRAVENOUS
  Filled 2020-10-08 (×11): qty 50

## 2020-10-08 MED ORDER — ONDANSETRON HCL 4 MG PO TABS: 4.0000 mg | ORAL_TABLET | Freq: Four times a day (QID) | ORAL | Status: DC | PRN

## 2020-10-08 MED ORDER — LEVOTHYROXINE SODIUM 25 MCG PO TABS
25.0000 ug | ORAL_TABLET | Freq: Every day | ORAL | Status: DC
  Administered 2020-10-09 – 2020-10-12 (×4): 25 ug via ORAL
  Filled 2020-10-08 (×4): qty 1

## 2020-10-08 MED ORDER — DILTIAZEM HCL ER COATED BEADS 120 MG PO CP24
120.0000 mg | ORAL_CAPSULE | Freq: Every day | ORAL | Status: DC
  Administered 2020-10-10 – 2020-10-12 (×3): 120 mg via ORAL
  Filled 2020-10-08 (×7): qty 1

## 2020-10-08 MED ORDER — ACETAMINOPHEN 325 MG PO TABS
650.0000 mg | ORAL_TABLET | Freq: Four times a day (QID) | ORAL | Status: DC | PRN
  Administered 2020-10-10: 650 mg via ORAL
  Filled 2020-10-08: qty 2

## 2020-10-08 MED ORDER — ENOXAPARIN SODIUM 40 MG/0.4ML ~~LOC~~ SOLN
40.0000 mg | SUBCUTANEOUS | Status: DC
  Administered 2020-10-08 – 2020-10-11 (×4): 40 mg via SUBCUTANEOUS
  Filled 2020-10-08 (×4): qty 0.4

## 2020-10-08 MED ORDER — ALPRAZOLAM 0.5 MG PO TABS
0.5000 mg | ORAL_TABLET | Freq: Two times a day (BID) | ORAL | Status: DC | PRN
  Administered 2020-10-09 – 2020-10-10 (×2): 0.5 mg via ORAL
  Filled 2020-10-08 (×2): qty 1

## 2020-10-08 MED ORDER — LACTATED RINGERS IV BOLUS (SEPSIS)
1000.0000 mL | Freq: Once | INTRAVENOUS | Status: AC
  Administered 2020-10-08: 1000 mL via INTRAVENOUS

## 2020-10-08 MED ORDER — PANTOPRAZOLE SODIUM 40 MG PO TBEC
40.0000 mg | DELAYED_RELEASE_TABLET | Freq: Two times a day (BID) | ORAL | Status: DC
  Administered 2020-10-08 – 2020-10-12 (×7): 40 mg via ORAL
  Filled 2020-10-08 (×7): qty 1

## 2020-10-08 NOTE — ED Triage Notes (Signed)
Pt from home with wife.   EMS states previous call out for breathing problems. Pt was a/o x 4. EMS left. Came back for another call. Pt was lethargic, unable to answer questions. Wife stated to EMS " I gave him all of his meds" EMS stated pt takes some narcotics.    When asked pt why he was sent to the ER, pt stated " I dont feel like getting into it"

## 2020-10-08 NOTE — Progress Notes (Signed)
Pharmacy Antibiotic Note  Christian Ellison is a 60 y.o. male admitted on 10/08/2020 with aspiration pneumonia.  Pharmacy has been consulted for zosyn dosing.  Plan: Zosyn 3.375gm IV q8h (EIN) Monitor for clinical course, LOT, and deescalation  Height: 6' (182.9 cm) Weight: 86.2 kg (190 lb) IBW/kg (Calculated) : 77.6  Temp (24hrs), Avg:101.7 F (38.7 C), Min:101.4 F (38.6 C), Max:101.9 F (38.8 C)  Recent Labs  Lab 10/08/20 1316 10/08/20 1520  WBC 5.4  --   CREATININE 0.84  --   LATICACIDVEN 2.4* 1.9    Estimated Creatinine Clearance: 105.2 mL/min (by C-G formula based on SCr of 0.84 mg/dL).    No Known Allergies  Antimicrobials this admission: 2/22 zosyn >>    Dose adjustments this admission: n/a    Dwayne A. Jeanella Craze, PharmD, BCPS, FNKF Clinical Pharmacist [facility] Please utilize Amion for appropriate phone number to reach the unit pharmacist [facility])   10/08/2020 5:27 PM

## 2020-10-08 NOTE — Progress Notes (Signed)
Patient refused one of our CPAP for tonight.  He wants to wait for wife to bring his from home.

## 2020-10-08 NOTE — Progress Notes (Signed)
Patient's wife brought his CPAP from home.  Ran O2 through patient's machine.  Patient is currently on 3L and HR is 81, Sat is 94%.

## 2020-10-08 NOTE — ED Provider Notes (Signed)
[facility] EMERGENCY DEPARTMENT Provider Note   CSN: [id number] Arrival date & time: 10/08/20  1153     History Chief Complaint  Patient presents with  . Altered Mental Status    Christian Ellison is a 60 y.o. male.  HPI   Patient was brought into the ED by EMS.  Patient has history of multiple medical problems including aspiration pneumonia, hepatitis, chronic opiate use who comes in with confusion and fever.  To EMS patient had been having some issues with cough questionable breathing problems.  Initially the patient was alert and oriented.  EMS left and then received another call.  Wife indicated that she gave him all of his medications.  The second time EMS arrived he was lethargic unable to answer questions clearly.  Patient himself is not really able to tell me why he is here.  He starts telling me about his family.  He cannot tell me if he has been having fevers or chills or coughing.    Past Medical History:  Diagnosis Date  . Anxiety   . Aspiration pneumonia (HCC) 05/26/2018   RLL  . Chronic pain   . Closed fracture of xiphoid process   . Esophageal stricture   . GERD (gastroesophageal reflux disease)   . 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   Marland Kitchen Opiate abuse, continuous (HCC)   . Pulmonary edema 02/02/2018  . Sepsis due to pneumonia (HCC) 03/11/2019  . Sleep apnea    CPAP nightly    Patient Active Problem List   Diagnosis Date Noted  . GERD (gastroesophageal reflux disease)   . Esophageal motility disorder   . Alcohol abuse, in remission 08/30/2020  . Gastroesophageal reflux disease with esophagitis 08/30/2020  . Moderate recurrent major depression (HCC) 08/30/2020  . Encounter to establish care 08/30/2020  . OSA on CPAP 08/30/2020  . Anxiety 06/12/2020  . Benzodiazepine dependence (HCC) 06/12/2020  . History of substance abuse (HCC) 06/12/2020  . Pneumonia 06/24/2019  . Hypothyroidism 03/11/2019  . Recurrent aspiration pneumonia (HCC)   .  Essential hypertension 02/02/2018  . CHF (congestive heart failure) (HCC) 02/02/2018  . Dysphagia 02/02/2018  . Esophageal stricture/stenosis/esophageal dysmotility with recurrent aspiration 02/02/2018  . Esophageal stenosis 02/02/2018  . Transaminitis   . Hx of hepatitis C 01/17/2013  . Depression 01/17/2013  . Anxiety with depression 01/17/2013    Past Surgical History:  Procedure Laterality Date  . BIOPSY  03/18/2018   Procedure: BIOPSY;  Surgeon: Malissa Hippo, MD;  Location: [facility];  Service: Endoscopy;;  gastric   . COLONOSCOPY N/A 11/22/2013   Dr. Karilyn Cota: Normal except for hemorrhoids.  Next colonoscopy 10 years.  . ESOPHAGEAL DILATION N/A 03/18/2018   Procedure: ESOPHAGEAL DILATION;  Surgeon: Malissa Hippo, MD;  Location: [facility];  Service: Endoscopy;  Laterality: N/A;  . ESOPHAGEAL DILATION N/A 08/07/2020   Procedure: ESOPHAGEAL DILATION;  Surgeon: Malissa Hippo, MD;  Location: [facility];  Service: Endoscopy;  Laterality: N/A;  . ESOPHAGEAL MANOMETRY N/A 06/15/2018   Procedure: ESOPHAGEAL MANOMETRY (EM);  Surgeon: Napoleon Form, MD;  Location: [facility];  Service: Endoscopy;  Laterality: N/A;  . ESOPHAGOGASTRODUODENOSCOPY (EGD) WITH PROPOFOL N/A 03/18/2018   Dr. Karilyn Cota: Benign-appearing mild esophageal stenosis proximal to the GE J, status post dilation.  2 cm hiatal hernia.  Gastritis, biopsies negative for H. pylori  . ESOPHAGOGASTRODUODENOSCOPY (EGD) WITH PROPOFOL N/A 08/07/2020   Procedure: ESOPHAGOGASTRODUODENOSCOPY (EGD) WITH PROPOFOL;  Surgeon: Malissa Hippo, MD;  Location: [facility];  Service: Endoscopy;  Laterality: N/A;  . Fracture Right Leg     Patient has rod/screws in this leg  . ORIF CALCANEOUS FRACTURE Left 07/04/2020   Procedure: OPEN REDUCTION INTERNAL FIXATION (ORIF) LEFT TALUS AND CALCANEOUS FRACTURE, SUBTALAR JOINT ARTHROTOMY WITH REMOVAL OF LOOSE BODIES, TALONAVICULAR JOINT ARTHOTOMY WITH REMOVAL OF LOOSE BODIES;  Surgeon:  Terance Hart, MD;  Location: [facility] OR;  Service: Orthopedics;  Laterality: Left;  . Rotor Cuff  2012   Right Shoulder       Family History  Problem Relation Age of Onset  . Breast cancer Mother   . Kidney failure Mother   . Stroke Mother   . Colon cancer Father   . Bladder Cancer Father   . Prostate cancer Father   . Hypertension Sister   . Hypothyroidism Sister   . Healthy Sister   . Healthy Daughter   . Healthy Son   . Healthy Son     Social History   Tobacco Use  . Smoking status: Never Smoker  . Smokeless tobacco: Never Used  Vaping Use  . Vaping Use: Never used  Substance Use Topics  . Alcohol use: Not Currently  . Drug use: Not Currently    Types: Benzodiazepines    Comment: opiates    Home Medications Prior to Admission medications   Medication Sig Start Date End Date Taking? Authorizing Provider  acetaminophen (TYLENOL) 325 MG tablet Take 2 tablets (650 mg total) by mouth every 6 (six) hours as needed for mild pain (or Fever >/= 101). 05/17/19   Shon Hale, MD  albuterol (VENTOLIN HFA) 108 (90 Base) MCG/ACT inhaler Inhale 2 puffs into the lungs every 6 (six) hours as needed for wheezing or shortness of breath. 09/09/20   Anabel Halon, MD  ALPRAZolam Prudy Feeler) 1 MG tablet Take 1 mg by mouth every 6 (six) hours as needed for anxiety.  10/22/19   [provider]  aspirin EC 81 MG tablet Take 1 tablet (81 mg total) by mouth daily with breakfast. Patient taking differently: Take 81 mg by mouth at bedtime. 03/13/19   Shon Hale, MD  Cimetidine (TAGAMET PO) Take 75 mg by mouth daily as needed (if he feels his food coming up after meal).    [provider]  diltiazem (DILACOR XR) 120 MG 24 hr capsule Take 1 capsule (120 mg total) by mouth daily. 08/17/20   Danford, Earl Lites, MD  Glucosamine HCl 1000 MG TABS Take 1,000 mg by mouth at bedtime.    [provider]  levofloxacin (LEVAQUIN) 750 MG tablet Take 1 tablet (750 mg total)  by mouth daily. 08/30/20   Anabel Halon, MD  levothyroxine (SYNTHROID) 25 MCG tablet Take 1 tablet (25 mcg total) by mouth daily. 09/10/20   Anabel Halon, MD  Multiple Vitamin (MULTIVITAMIN WITH MINERALS) TABS tablet Take 1 tablet by mouth daily. 50+    [provider]  pantoprazole (PROTONIX) 40 MG tablet Take 1 tablet (40 mg total) by mouth 2 (two) times daily. 08/07/20 08/07/21  Shon Hale, MD    Allergies    Patient has no known allergies.  Review of Systems   Review of Systems  All other systems reviewed and are negative.   Physical Exam Updated Vital Signs BP 140/76   Pulse (!) 133   Temp (!) 101.4 F (38.6 C) (Oral)   Resp (!) 22   Ht 1.829 m (6')   Wt 86.2 kg  SpO2 90%   BMI 25.77 kg/m   Physical Exam Vitals and nursing note reviewed.  Constitutional:      Appearance: He is well-developed and well-nourished. He is not diaphoretic.  HENT:     Head: Normocephalic and atraumatic.     Right Ear: External ear normal.     Left Ear: External ear normal.  Eyes:     General: No scleral icterus.       Right eye: No discharge.        Left eye: No discharge.     Conjunctiva/sclera: Conjunctivae normal.  Neck:     Trachea: No tracheal deviation.     Comments: No pain with range of motion Cardiovascular:     Rate and Rhythm: Normal rate and regular rhythm.     Pulses: Intact distal pulses.  Pulmonary:     Effort: Pulmonary effort is normal. No respiratory distress.     Breath sounds: Normal breath sounds. No stridor. No wheezing or rales.  Abdominal:     General: Bowel sounds are normal. There is no distension.     Palpations: Abdomen is soft.     Tenderness: There is no abdominal tenderness. There is no guarding or rebound.  Musculoskeletal:        General: No tenderness or edema.     Cervical back: Normal range of motion and neck supple. No rigidity.  Skin:    General: Skin is warm and dry.     Findings: No rash.  Neurological:     Mental  Status: He is alert. He is disoriented.     Cranial Nerves: No cranial nerve deficit (no facial droop, extraocular movements intact, no slurred speech).     Sensory: No sensory deficit.     Motor: No abnormal muscle tone or seizure activity.     Coordination: Coordination normal.     Deep Tendon Reflexes: Strength normal.     Comments: Patient follows commands, answers questions, some of his speech is nonsensical, patient is aware of his location and the year but thinks it is January  Psychiatric:        Mood and Affect: Mood and affect normal.     ED Results / Procedures / Treatments   Labs (all labs ordered are listed, but only abnormal results are displayed) Labs Reviewed  LACTIC ACID, PLASMA - Abnormal; Notable for the following components:      Result Value   Lactic Acid, Venous 2.4 (*)    All other components within normal limits  COMPREHENSIVE METABOLIC PANEL - Abnormal; Notable for the following components:   Glucose, Bld 120 (*)    All other components within normal limits  CBC WITH DIFFERENTIAL/PLATELET - Abnormal; Notable for the following components:   Lymphs Abs 0.4 (*)    All other components within normal limits  BLOOD GAS, VENOUS - Abnormal; Notable for the following components:   Acid-Base Excess 3.2 (*)    All other components within normal limits  RESP PANEL BY RT-PCR (FLU A&B, COVID) ARPGX2  URINE CULTURE  CULTURE, BLOOD (ROUTINE X 2)  CULTURE, BLOOD (ROUTINE X 2)  PROTIME-INR  APTT  ETHANOL  AMMONIA  LACTIC ACID, PLASMA  URINALYSIS, ROUTINE W REFLEX MICROSCOPIC  RAPID URINE DRUG SCREEN, HOSP PERFORMED    EKG EKG Interpretation  Date/Time:  Tuesday October 08 2020 12:02:36 EST Ventricular Rate:  130 PR Interval:    QRS Duration: 81 QT Interval:  283 QTC Calculation: 417 R Axis:   -31 Text Interpretation:  Sinus tachycardia Probable left atrial enlargement Left axis deviation Abnormal R-wave progression, late transition Abnormal T, consider  ischemia, lateral leads , new since last tracing Since last tracing rate faster Confirmed by Linwood Dibbles [phone]) on 10/08/2020 12:08:56 PM   Radiology DG Chest Port 1 View  Result Date: 10/08/2020 CLINICAL DATA:  Lethargic and confuse, concern for sepsis EXAM: PORTABLE CHEST 1 VIEW COMPARISON:  Chest radiograph September 12, 2020 FINDINGS: The heart size and mediastinal contours are unchanged. Patchy right basilar airspace opacities. No significant pleural effusion or visible pneumothorax. The visualized skeletal structures are unchanged. IMPRESSION: Patchy right basilar airspace opacities favor infection. Electronically Signed   By: Maudry Mayhew MD   On: 10/08/2020 13:21    Procedures .Critical Care Performed by: Linwood Dibbles, MD Authorized by: Linwood Dibbles, MD   Critical care provider statement:    Critical care time (minutes):  45   Critical care was time spent personally by me on the following activities:  Discussions with consultants, evaluation of patient's response to treatment, examination of patient, ordering and performing treatments and interventions, ordering and review of laboratory studies, ordering and review of radiographic studies, pulse oximetry, re-evaluation of patient's condition, obtaining history from patient or surrogate and review of old charts     Medications Ordered in ED Medications  azithromycin (ZITHROMAX) tablet 500 mg (has no administration in time range)  lactated ringers bolus 1,000 mL (1,000 mLs Intravenous New Bag/Given 10/08/20 1317)  acetaminophen (TYLENOL) tablet 650 mg (650 mg Oral Given 10/08/20 1317)  cefTRIAXone (ROCEPHIN) 1 g in sodium chloride 0.9 % 100 mL IVPB (1 g Intravenous New Bag/Given 10/08/20 1316)    ED Course  I have reviewed the triage vital signs and the nursing notes.  Pertinent labs & imaging results that were available during my care of the patient were reviewed by me and considered in my medical decision making (see chart for  details).  Clinical Course as of 10/08/20 1429  Tue Oct 08, 2020  1409 Lactic acid level is elevated. [JK]  1409 Venous blood gas does not show any evidence of acidosis or severe hypercarbia [JK]  1409 Alcohol levels negative, ammonia levels normal [JK]  1409 Covid and flu were negative.  No leukocytosis. [JK]  1409 Chest x-ray suggestive of pneumonia [JK]  1425 Mental status seems to be improving although patient does not call on the details of this visit today.  Did not realize he had an IV in place for example [JK]  1427 Patient still tachycardic.  Additional IV fluid boluses ordered.  Will give a total of 30 cc/kg [JK]    Clinical Course User Index [JK] Linwood Dibbles, MD   MDM Rules/Calculators/A&P                          Patient presented to the ED for evaluation of fever and confusion.  Patient has history of multiple medical problems including recurrent pneumonia.  In the ED the patient was able to speak clearly however he did seem confused and forgetful.  Patient was initially tachycardic and symptoms were concerning for the possibility of sepsis.  Patient has remained normotensive does have a slightly elevated lactic acid level.  Robotics have been ordered for pneumonia which was evident on chest x-ray.  Patient is negative for Covid.  Urinalysis still pending.  Patient's mental status appears to be improving but he still is confused.  This may be multifactorial including his infection as well as  possibly polypharmacy issues.  At this time I doubt meningitis or acute CNS event.  I have ordered IV antibiotics.  I will consult the medical service for admission. Final Clinical Impression(s) / ED Diagnoses Final diagnoses:  Pneumonia of both lungs due to infectious organism, unspecified part of lung  Elevated lactic acid level      Linwood Dibbles, MD 10/08/20 1429

## 2020-10-08 NOTE — H&P (Signed)
History and Physical    Christian Ellison RWE:[id number] DOB: [birth year] DOA: 10/08/2020  PCP: Anabel Halon, MD  Patient coming from: home  I have personally briefly reviewed patient's old medical records in [facility]  Chief Complaint: fever  HPI: Christian Ellison is a 60 y.o. male with medical history significant of esophageal stricture/dysmotility, recurrent aspiration pneumonia, sleep apnea, presents to the emergency room with altered mental status, fever.  Patient reports approximately 3 to 4 days ago, he did have an episode where he was choking on food.  He says he normally tries to eat solid foods, but on the day, he was eating steak which he did not tolerate very well.  His wife reports that in the past few days, she also noted that he was laying down after eating.  He said that he felt relatively well yesterday.  Overnight and into this morning, his wife noticed that he had short shallow breathing.  She checked his temperature and noted that he was febrile.  He also began to have rigors.  EMS was called and he was initially evaluated.  He was up and ambulating at that time and per patient, the decision was left to him whether he wanted to come to the hospital.  At that time, patient refused to come to the hospital.  EMS left, but then his wife called them back when patient became increasingly lethargic.  She reports that he was hypoxic.  He was brought to the ER for evaluation.  ED Course: On arrival to the emergency room he is noted to be febrile, tachycardic, CBC/chemistries unremarkable.  Lactic acid mildly elevated.  Chest x-ray indicated right lower lobe infiltrate.  He was started on antibiotics.  Has been referred for admission.  Review of Systems:  Review of Systems  Constitutional: Positive for chills and fever.  HENT: Negative for congestion and sore throat.   Eyes: Negative for blurred vision and double vision.  Respiratory: Positive for cough and shortness of  breath.   Cardiovascular: Negative for chest pain and palpitations.  Gastrointestinal: Positive for vomiting. Negative for abdominal pain, diarrhea and nausea.  Genitourinary: Negative for dysuria.  Neurological: Negative for focal weakness.  Psychiatric/Behavioral: The patient is nervous/anxious.       Past Medical History:  Diagnosis Date  . Anxiety   . Aspiration pneumonia (HCC) 05/26/2018   RLL  . Chronic pain   . Closed fracture of xiphoid process   . Esophageal stricture   . GERD (gastroesophageal reflux disease)   . 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   Marland Kitchen Opiate abuse, continuous (HCC)   . Pulmonary edema 02/02/2018  . Sepsis due to pneumonia (HCC) 03/11/2019  . Sleep apnea    CPAP nightly    Past Surgical History:  Procedure Laterality Date  . BIOPSY  03/18/2018   Procedure: BIOPSY;  Surgeon: Malissa Hippo, MD;  [facility];  Service: Endoscopy;;  gastric   . COLONOSCOPY N/A 11/22/2013   Dr. Karilyn Cota: Normal except for hemorrhoids.  Next colonoscopy 10 years.  . ESOPHAGEAL DILATION N/A 03/18/2018   Procedure: ESOPHAGEAL DILATION;  Surgeon: Malissa Hippo, MD;  Location: [facility];  Service: Endoscopy;  Laterality: N/A;  . ESOPHAGEAL DILATION N/A 08/07/2020   Procedure: ESOPHAGEAL DILATION;  Surgeon: Malissa Hippo, MD;  Location: [facility];  Service: Endoscopy;  Laterality: N/A;  . ESOPHAGEAL MANOMETRY N/A 06/15/2018   Procedure: ESOPHAGEAL MANOMETRY (EM);  Surgeon: Marsa Aris  V, MD;  Location: [facility];  Service: Endoscopy;  Laterality: N/A;  . ESOPHAGOGASTRODUODENOSCOPY (EGD) WITH PROPOFOL N/A 03/18/2018   Dr. Karilyn Cota: Benign-appearing mild esophageal stenosis proximal to the GE J, status post dilation.  2 cm hiatal hernia.  Gastritis, biopsies negative for H. pylori  . ESOPHAGOGASTRODUODENOSCOPY (EGD) WITH PROPOFOL N/A 08/07/2020   Procedure: ESOPHAGOGASTRODUODENOSCOPY (EGD) WITH PROPOFOL;  Surgeon: Malissa Hippo, MD;   [facility];  Service: Endoscopy;  Laterality: N/A;  . Fracture Right Leg     Patient has rod/screws in this leg  . ORIF CALCANEOUS FRACTURE Left 07/04/2020   Procedure: OPEN REDUCTION INTERNAL FIXATION (ORIF) LEFT TALUS AND CALCANEOUS FRACTURE, SUBTALAR JOINT ARTHROTOMY WITH REMOVAL OF LOOSE BODIES, TALONAVICULAR JOINT ARTHOTOMY WITH REMOVAL OF LOOSE BODIES;  Surgeon: Terance Hart, MD;  Location: [facility];  Service: Orthopedics;  Laterality: Left;  . Rotor Cuff  2012   Right Shoulder    Social History:  reports that he has never smoked. He has never used smokeless tobacco. He reports previous alcohol use. He reports previous drug use. Drug: Benzodiazepines.  No Known Allergies  Family History  Problem Relation Age of Onset  . Breast cancer Mother   . Kidney failure Mother   . Stroke Mother   . Colon cancer Father   . Bladder Cancer Father   . Prostate cancer Father   . Hypertension Sister   . Hypothyroidism Sister   . Healthy Sister   . Healthy Daughter   . Healthy Son   . Healthy Son      Prior to Admission medications   Medication Sig Start Date End Date Taking? Authorizing Provider  acetaminophen (TYLENOL) 325 MG tablet Take 2 tablets (650 mg total) by mouth every 6 (six) hours as needed for mild pain (or Fever >/= 101). 05/17/19  Yes Emokpae, Courage, MD  albuterol (VENTOLIN HFA) 108 (90 Base) MCG/ACT inhaler Inhale 2 puffs into the lungs every 6 (six) hours as needed for wheezing or shortness of breath. 09/09/20  Yes Allena Katz, Earlie Lou, MD  ALPRAZolam Prudy Feeler) 1 MG tablet Take 1 mg by mouth every 6 (six) hours as needed for anxiety.  10/22/19  Yes [provider]  aspirin EC 81 MG tablet Take 1 tablet (81 mg total) by mouth daily with breakfast. Patient taking differently: Take 81 mg by mouth at bedtime. 03/13/19  Yes Shon Hale, MD  Cimetidine (TAGAMET PO) Take 75 mg by mouth daily as needed (if he feels his food coming up after meal).   Yes  [provider]  diltiazem (DILACOR XR) 120 MG 24 hr capsule Take 1 capsule (120 mg total) by mouth daily. 08/17/20  Yes Danford, Earl Lites, MD  Glucosamine HCl 1000 MG TABS Take 1,000 mg by mouth at bedtime.   Yes [provider]  levothyroxine (SYNTHROID) 25 MCG tablet Take 1 tablet (25 mcg total) by mouth daily. 09/10/20  Yes Anabel Halon, MD  Multiple Vitamin (MULTIVITAMIN WITH MINERALS) TABS tablet Take 1 tablet by mouth daily. 50+   Yes [provider]  pantoprazole (PROTONIX) 40 MG tablet Take 1 tablet (40 mg total) by mouth 2 (two) times daily. 08/07/20 08/07/21 Yes Shon Hale, MD    Physical Exam: Vitals:   10/08/20 1345 10/08/20 1510 10/08/20 1530 10/08/20 1600  BP:   (!) 96/41 (!) 90/59  Pulse:   100 83  Resp: (!) 22  (!) 21 17  Temp:  (!) 101.9 F (38.8 C)    TempSrc:  Oral    SpO2:   (!) 88% (!) 84%  Weight:      Height:        Constitutional: NAD, calm, comfortable Eyes: PERRL, lids and conjunctivae normal ENMT: Mucous membranes are moist. Posterior pharynx clear of any exudate or lesions.Normal dentition.  Neck: normal, supple, no masses, no thyromegaly Respiratory: coarse crackles at right base. Normal respiratory effort. No accessory muscle use.  Cardiovascular: Regular rate and rhythm, no murmurs / rubs / gallops. No extremity edema. 2+ pedal pulses. No carotid bruits.  Abdomen: no tenderness, no masses palpated. No hepatosplenomegaly. Bowel sounds positive.  Musculoskeletal: no clubbing / cyanosis. No joint deformity upper and lower extremities. Good ROM, no contractures. Normal muscle tone.  Skin: no rashes, lesions, ulcers. No induration Neurologic: CN 2-12 grossly intact. Sensation intact, DTR normal. Strength 5/5 in all 4.  Psychiatric: Normal judgment and insight. Alert and oriented x 3. Normal mood.    Labs on Admission: I have personally reviewed following labs and imaging studies  CBC: Recent Labs  Lab  10/08/20 1316  WBC 5.4  NEUTROABS 4.7  HGB 14.5  HCT 44.1  MCV 88.2  PLT 150   Basic Metabolic Panel: Recent Labs  Lab 10/08/20 1316  NA 136  K 3.6  CL 100  CO2 27  GLUCOSE 120*  BUN 11  CREATININE 0.84  CALCIUM 9.1   GFR: Estimated Creatinine Clearance: 105.2 mL/min (by C-G formula based on SCr of 0.84 mg/dL). Liver Function Tests: Recent Labs  Lab 10/08/20 1316  AST 21  ALT 17  ALKPHOS 63  BILITOT 0.8  PROT 7.8  ALBUMIN 4.4   No results for input(s): LIPASE, AMYLASE in the last 168 hours. Recent Labs  Lab 10/08/20 1319  AMMONIA 25   Coagulation Profile: Recent Labs  Lab 10/08/20 1316  INR 1.0   Cardiac Enzymes: No results for input(s): CKTOTAL, CKMB, CKMBINDEX, TROPONINI in the last 168 hours. BNP (last 3 results) No results for input(s): PROBNP in the last 8760 hours. HbA1C: No results for input(s): HGBA1C in the last 72 hours. CBG: No results for input(s): GLUCAP in the last 168 hours. Lipid Profile: No results for input(s): CHOL, HDL, LDLCALC, TRIG, CHOLHDL, LDLDIRECT in the last 72 hours. Thyroid Function Tests: No results for input(s): TSH, T4TOTAL, FREET4, T3FREE, THYROIDAB in the last 72 hours. Anemia Panel: No results for input(s): VITAMINB12, FOLATE, FERRITIN, TIBC, IRON, RETICCTPCT in the last 72 hours. Urine analysis:    Component Value Date/Time   COLORURINE STRAW (A) 10/08/2020 1621   APPEARANCEUR CLEAR 10/08/2020 1621   LABSPEC 1.006 10/08/2020 1621   PHURINE 6.0 10/08/2020 1621   GLUCOSEU NEGATIVE 10/08/2020 1621   HGBUR SMALL (A) 10/08/2020 1621   BILIRUBINUR NEGATIVE 10/08/2020 1621   KETONESUR NEGATIVE 10/08/2020 1621   PROTEINUR NEGATIVE 10/08/2020 1621   NITRITE NEGATIVE 10/08/2020 1621   LEUKOCYTESUR NEGATIVE 10/08/2020 1621    Radiological Exams on Admission: DG Chest Port 1 View  Result Date: 10/08/2020 CLINICAL DATA:  Lethargic and confuse, concern for sepsis EXAM: PORTABLE CHEST 1 VIEW COMPARISON:  Chest  radiograph September 12, 2020 FINDINGS: The heart size and mediastinal contours are unchanged. Patchy right basilar airspace opacities. No significant pleural effusion or visible pneumothorax. The visualized skeletal structures are unchanged. IMPRESSION: Patchy right basilar airspace opacities favor infection. Electronically Signed   By: Maudry MayhewJeffrey  Waltz MD   On: 10/08/2020 13:21    EKG: Independently reviewed. Sinus tachy  Assessment/Plan Active Problems:   Esophageal stricture/stenosis/esophageal dysmotility with  recurrent aspiration   Recurrent aspiration pneumonia (HCC)   Hypothyroidism   Benzodiazepine dependence (HCC)   OSA on CPAP     Recurrent aspiration pneumonia -Patient has been started on IV fluids -We will provide antibiotic coverage with Zosyn -Check blood cultures -Aspiration appears to be esophageal in origin  Esophageal stricture/stenosis/dysmotility -Patient is followed by Dr. Karilyn Cota -We will request GI consult to see if repeat EGD with dilatation is indicated -Continue on Protonix -He also receives diltiazem  Obstructive sleep apnea -Continue CPAP nightly  Hypothyroidism -Continue on Synthroid  Anxiety -Dependent on benzodiazepines -Continue at reduced dose  DVT prophylaxis: lovenox  Code Status: full code  Family Communication: discussed with wife over the phone  Disposition Plan: discharge home once improved  Consults called:  GI Admission status: observation, telemetry   Erick Blinks MD [facility]   If 7PM-7AM, please contact night-coverage [email or web address]   10/08/2020, 5:28 PM

## 2020-10-08 NOTE — Telephone Encounter (Signed)
error 

## 2020-10-09 DIAGNOSIS — Z20822 Contact with and (suspected) exposure to covid-19: Secondary | ICD-10-CM | POA: Diagnosis present

## 2020-10-09 DIAGNOSIS — J189 Pneumonia, unspecified organism: Secondary | ICD-10-CM

## 2020-10-09 DIAGNOSIS — K222 Esophageal obstruction: Secondary | ICD-10-CM

## 2020-10-09 DIAGNOSIS — Z79890 Hormone replacement therapy (postmenopausal): Secondary | ICD-10-CM | POA: Diagnosis not present

## 2020-10-09 DIAGNOSIS — J9601 Acute respiratory failure with hypoxia: Secondary | ICD-10-CM | POA: Diagnosis present

## 2020-10-09 DIAGNOSIS — Z841 Family history of disorders of kidney and ureter: Secondary | ICD-10-CM | POA: Diagnosis not present

## 2020-10-09 DIAGNOSIS — Z79899 Other long term (current) drug therapy: Secondary | ICD-10-CM | POA: Diagnosis not present

## 2020-10-09 DIAGNOSIS — Z7982 Long term (current) use of aspirin: Secondary | ICD-10-CM | POA: Diagnosis not present

## 2020-10-09 DIAGNOSIS — Z823 Family history of stroke: Secondary | ICD-10-CM | POA: Diagnosis not present

## 2020-10-09 DIAGNOSIS — F1320 Sedative, hypnotic or anxiolytic dependence, uncomplicated: Secondary | ICD-10-CM | POA: Diagnosis present

## 2020-10-09 DIAGNOSIS — Z8052 Family history of malignant neoplasm of bladder: Secondary | ICD-10-CM | POA: Diagnosis not present

## 2020-10-09 DIAGNOSIS — Z8042 Family history of malignant neoplasm of prostate: Secondary | ICD-10-CM | POA: Diagnosis not present

## 2020-10-09 DIAGNOSIS — Z9989 Dependence on other enabling machines and devices: Secondary | ICD-10-CM

## 2020-10-09 DIAGNOSIS — R4182 Altered mental status, unspecified: Secondary | ICD-10-CM | POA: Diagnosis present

## 2020-10-09 DIAGNOSIS — Z803 Family history of malignant neoplasm of breast: Secondary | ICD-10-CM | POA: Diagnosis not present

## 2020-10-09 DIAGNOSIS — E039 Hypothyroidism, unspecified: Secondary | ICD-10-CM

## 2020-10-09 DIAGNOSIS — R1319 Other dysphagia: Secondary | ICD-10-CM

## 2020-10-09 DIAGNOSIS — Z8249 Family history of ischemic heart disease and other diseases of the circulatory system: Secondary | ICD-10-CM | POA: Diagnosis not present

## 2020-10-09 DIAGNOSIS — J690 Pneumonitis due to inhalation of food and vomit: Secondary | ICD-10-CM | POA: Diagnosis present

## 2020-10-09 DIAGNOSIS — Z800 Family history of malignant neoplasm of digestive organs: Secondary | ICD-10-CM | POA: Diagnosis not present

## 2020-10-09 DIAGNOSIS — K2100 Gastro-esophageal reflux disease with esophagitis, without bleeding: Secondary | ICD-10-CM | POA: Diagnosis present

## 2020-10-09 DIAGNOSIS — F419 Anxiety disorder, unspecified: Secondary | ICD-10-CM | POA: Diagnosis present

## 2020-10-09 DIAGNOSIS — G4733 Obstructive sleep apnea (adult) (pediatric): Secondary | ICD-10-CM | POA: Diagnosis present

## 2020-10-09 DIAGNOSIS — G8929 Other chronic pain: Secondary | ICD-10-CM | POA: Diagnosis present

## 2020-10-09 DIAGNOSIS — R7989 Other specified abnormal findings of blood chemistry: Secondary | ICD-10-CM

## 2020-10-09 LAB — CBC
HCT: 36.1 % — ABNORMAL LOW (ref 39.0–52.0)
Hemoglobin: 11.5 g/dL — ABNORMAL LOW (ref 13.0–17.0)
MCH: 28.8 pg (ref 26.0–34.0)
MCHC: 31.9 g/dL (ref 30.0–36.0)
MCV: 90.5 fL (ref 80.0–100.0)
Platelets: 146 10*3/uL — ABNORMAL LOW (ref 150–400)
RBC: 3.99 MIL/uL — ABNORMAL LOW (ref 4.22–5.81)
RDW: 14.1 % (ref 11.5–15.5)
WBC: 8.3 10*3/uL (ref 4.0–10.5)
nRBC: 0 % (ref 0.0–0.2)

## 2020-10-09 LAB — BASIC METABOLIC PANEL
Anion gap: 9 (ref 5–15)
BUN: 15 mg/dL (ref 6–20)
CO2: 30 mmol/L (ref 22–32)
Calcium: 8.5 mg/dL — ABNORMAL LOW (ref 8.9–10.3)
Chloride: 103 mmol/L (ref 98–111)
Creatinine, Ser: 0.78 mg/dL (ref 0.61–1.24)
GFR, Estimated: >60 mL/min (ref >60)
Glucose, Bld: 133 mg/dL — ABNORMAL HIGH (ref 70–99)
Potassium: 3.5 mmol/L (ref 3.5–5.1)
Sodium: 142 mmol/L (ref 135–145)

## 2020-10-09 MED ORDER — IPRATROPIUM-ALBUTEROL 0.5-2.5 (3) MG/3ML IN SOLN
3.0000 mL | Freq: Two times a day (BID) | RESPIRATORY_TRACT | Status: DC
  Administered 2020-10-09 – 2020-10-12 (×6): 3 mL via RESPIRATORY_TRACT
  Filled 2020-10-09 (×6): qty 3

## 2020-10-09 NOTE — Progress Notes (Signed)
PROGRESS NOTE    Christian MealsJohn G Ellison  ZOX:[id number] DOB: Jun 13, 1961 DOA: 10/08/2020 PCP: Anabel HalonPatel, Rutwik K, MD    Brief Narrative:  Christian Ellison is a 60 yo male with a history of esophageal stricture/dysmotility, recurrent aspiration PNA, sleep apnea who presented with AMS and fever 3 days after a choking event at home. CXR demonstrated fluffy infiltrates of RLL consistent with aspiration PNA. He is being treated with antibiotics and GI consulted for possible EGD w/ esophageal dilation.    Assessment & Plan:   Principal Problem:   Recurrent aspiration pneumonia (HCC) Active Problems:   Esophageal stricture/stenosis/esophageal dysmotility with recurrent aspiration   Hypothyroidism   Benzodiazepine dependence (HCC)   OSA on CPAP   Recurrent aspiration pneumonia Breathing comfortably on 2L . Febrile on presentation, but without fever for past 12 hours. White count 8.9>8.3. Blood cx with no growth <24 hours. Aspiration likely 2/2 esophageal pathology - Continue Zosyn for now - Continue to follow blood cx - Tylenol for fever  - Duonebs BID - Soft diet, aspiration precautions  Esophageal stricture/stenosis/dysmotility Patient is followed by Dr. Karilyn Cotaehman. - GI consult to see if repeat EGD with dilatation is indicated - Continue Protonix - Continue home diltiazem  Obstructive sleep apnea Patient refused hospital CPAP but his wife was able to bring home machine.  - Continue CPAP nightly  Hypothyroidism -Continue on Synthroid  Anxiety Dependent on benzodiazepines (Xanax) -Continue at reduced dose   DVT prophylaxis: enoxaparin (LOVENOX) injection 40 mg Start: 10/08/20 1730  Code Status:  Full Family Communication: Wife visiting in hospital Disposition Plan: Status is: Observation  The patient remains OBS appropriate and will d/c before 2 midnights.  Dispo: The patient is from: Home              Anticipated d/c is to: Home              Anticipated d/c date is: 1 day               Patient currently is not medically stable to d/c.   Difficult to place patient No   Consultants:   GI  Procedures:   None; possible EGD on 2/23-24  Antimicrobials:   Zosyn day 2    Subjective: Christian Ellison is minimally engaged in interview, having just woken up. Responds to questions with one word answers or signifies "yes" and "no" with head movements. Denies pain or productive cough. Denies feeling SOB.   Objective: Vitals:   10/09/20 0204 10/09/20 0603 10/09/20 0605 10/09/20 0722  BP: 103/65 (!) 96/41    Pulse: 84 76    Resp: 20     Temp: (!) 97.5 F (36.4 C) 97.9 F (36.6 C)    TempSrc: Oral Oral    SpO2: 93% (!) 89% 93% 94%  Weight:      Height:        Intake/Output Summary (Last 24 hours) at 10/09/2020 0858 Last data filed at 10/09/2020 0319 Gross per 24 hour  Intake 3376.25 ml  Output 900 ml  Net 2476.25 ml   Filed Weights   10/08/20 1202 10/08/20 2049  Weight: 86.2 kg 92 kg    Examination:  Physical Exam Vitals reviewed.  Constitutional:      Comments: Lying in bed, appears comfortable. Answers questions with one-word answers   Cardiovascular:     Rate and Rhythm: Normal rate and regular rhythm.     Heart sounds: Normal heart sounds.  Pulmonary:     Comments: Effort normal on 2L  [place] Some crackles appreciated in R lung base.  Good air movement  Musculoskeletal:        General: No swelling or deformity.  Skin:    General: Skin is warm and dry.        Data Reviewed: I have personally reviewed following labs and imaging studies  CBC: Recent Labs  Lab 10/08/20 1316 10/08/20 1743 10/09/20 0444  WBC 5.4 8.9 8.3  NEUTROABS 4.7  --   --   HGB 14.5 12.7* 11.5*  HCT 44.1 38.4* 36.1*  MCV 88.2 87.9 90.5  PLT 150 154 146*   Basic Metabolic Panel: Recent Labs  Lab 10/08/20 1316 10/08/20 1743 10/09/20 0444  NA 136  --  142  K 3.6  --  3.5  CL 100  --  103  CO2 27  --  30  GLUCOSE 120*  --  133*  BUN 11  --  15   CREATININE 0.84 0.73 0.78  CALCIUM 9.1  --  8.5*   GFR: Estimated Creatinine Clearance: 110.5 mL/min (by C-G formula based on SCr of 0.78 mg/dL). Liver Function Tests: Recent Labs  Lab 10/08/20 1316  AST 21  ALT 17  ALKPHOS 63  BILITOT 0.8  PROT 7.8  ALBUMIN 4.4   No results for input(s): LIPASE, AMYLASE in the last 168 hours. Recent Labs  Lab 10/08/20 1319  AMMONIA 25   Coagulation Profile: Recent Labs  Lab 10/08/20 1316  INR 1.0   Cardiac Enzymes: No results for input(s): CKTOTAL, CKMB, CKMBINDEX, TROPONINI in the last 168 hours. BNP (last 3 results) No results for input(s): PROBNP in the last 8760 hours. HbA1C: No results for input(s): HGBA1C in the last 72 hours. CBG: No results for input(s): GLUCAP in the last 168 hours. Lipid Profile: No results for input(s): CHOL, HDL, LDLCALC, TRIG, CHOLHDL, LDLDIRECT in the last 72 hours. Thyroid Function Tests: No results for input(s): TSH, T4TOTAL, FREET4, T3FREE, THYROIDAB in the last 72 hours. Anemia Panel: No results for input(s): VITAMINB12, FOLATE, FERRITIN, TIBC, IRON, RETICCTPCT in the last 72 hours. Sepsis Labs: Recent Labs  Lab 10/08/20 1316 10/08/20 1520  LATICACIDVEN 2.4* 1.9    Recent Results (from the past 240 hour(s))  Resp Panel by RT-PCR (Flu A&B, Covid) Nasopharyngeal Swab     Status: None   Collection Time: 10/08/20 12:29 PM   Specimen: Nasopharyngeal Swab; Nasopharyngeal(NP) swabs in vial transport medium  Result Value Ref Range Status   SARS Coronavirus 2 by RT PCR NEGATIVE NEGATIVE Final    Comment: (NOTE) SARS-CoV-2 target nucleic acids are NOT DETECTED.  The SARS-CoV-2 RNA is generally detectable in upper respiratory specimens during the acute phase of infection. The lowest concentration of SARS-CoV-2 viral copies this assay can detect is 138 copies/mL. A negative result does not preclude SARS-Cov-2 infection and should not be used as the sole basis for treatment or other patient  management decisions. A negative result may occur with  improper specimen collection/handling, submission of specimen other than nasopharyngeal swab, presence of viral mutation(s) within the areas targeted by this assay, and inadequate number of viral copies(<138 copies/mL). A negative result must be combined with clinical observations, patient history, and epidemiological information. The expected result is Negative.  Fact Sheet for Patients:  BloggerCourse.com  Fact Sheet for Healthcare Providers:  SeriousBroker.it  This test is no t yet approved or cleared by the [place] FDA and  has been authorized for detection and/or diagnosis of SARS-CoV-2 by FDA under an Emergency Use Authorization (EUA).  This EUA will remain  in effect (meaning this test can be used) for the duration of the COVID-19 declaration under Section 564(b)(1) of the Act, 21 U.S.C.section 360bbb-3(b)(1), unless the authorization is terminated  or revoked sooner.       Influenza A by PCR NEGATIVE NEGATIVE Final   Influenza B by PCR NEGATIVE NEGATIVE Final    Comment: (NOTE) The Xpert Xpress SARS-CoV-2/FLU/RSV plus assay is intended as an aid in the diagnosis of influenza from Nasopharyngeal swab specimens and should not be used as a sole basis for treatment. Nasal washings and aspirates are unacceptable for Xpert Xpress SARS-CoV-2/FLU/RSV testing.  Fact Sheet for Patients: BloggerCourse.com  Fact Sheet for Healthcare Providers: SeriousBroker.it  This test is not yet approved or cleared by the [place] FDA and has been authorized for detection and/or diagnosis of SARS-CoV-2 by FDA under an Emergency Use Authorization (EUA). This EUA will remain in effect (meaning this test can be used) for the duration of the COVID-19 declaration under Section 564(b)(1) of the Act, 21 U.S.C. section 360bbb-3(b)(1),  unless the authorization is terminated or revoked.  Performed at [facility], [address] [place], [place] [address]   Blood Culture (routine x 2)     Status: None (Preliminary result)   Collection Time: 10/08/20  1:10 PM   Specimen: BLOOD  Result Value Ref Range Status   Specimen Description BLOOD RIGHT ANTECUBITAL  Final   Special Requests   Final    BOTTLES DRAWN AEROBIC AND ANAEROBIC Blood Culture adequate volume   Culture   Final    NO GROWTH < 24 HOURS Performed at [facility], [address], [place], [place] [address]    Report Status PENDING  Incomplete  Blood Culture (routine x 2)     Status: None (Preliminary result)   Collection Time: 10/08/20  1:15 PM   Specimen: BLOOD LEFT HAND  Result Value Ref Range Status   Specimen Description BLOOD LEFT HAND  Final   Special Requests   Final    BOTTLES DRAWN AEROBIC AND ANAEROBIC Blood Culture adequate volume   Culture   Final    NO GROWTH < 24 HOURS Performed at [facility], [address] [place], [place] [address]    Report Status PENDING  Incomplete  Culture, blood (routine x 2) Call MD if unable to obtain prior to antibiotics being given     Status: None (Preliminary result)   Collection Time: 10/08/20  5:35 PM   Specimen: BLOOD LEFT HAND  Result Value Ref Range Status   Specimen Description BLOOD LEFT HAND  Final   Special Requests   Final    BOTTLES DRAWN AEROBIC AND ANAEROBIC Blood Culture adequate volume   Culture   Final    NO GROWTH < 24 HOURS Performed at [facility], [address], [place], [place] [address]    Report Status PENDING  Incomplete  Culture, blood (routine x 2) Call MD if unable to obtain prior to antibiotics being given     Status: None (Preliminary result)   Collection Time: 10/08/20  5:40 PM   Specimen: BLOOD RIGHT HAND  Result Value Ref Range Status   Specimen Description BLOOD RIGHT HAND  Final   Special Requests   Final    BOTTLES DRAWN AEROBIC AND ANAEROBIC Blood Culture adequate  volume   Culture   Final    NO GROWTH < 24 HOURS Performed at [facility], [address], [place], [place] [address]    Report Status  PENDING  Incomplete         Radiology Studies: DG Chest Port 1 View  Result Date: 10/08/2020 CLINICAL DATA:  Lethargic and confuse, concern for sepsis EXAM: PORTABLE CHEST 1 VIEW COMPARISON:  Chest radiograph September 12, 2020 FINDINGS: The heart size and mediastinal contours are unchanged. Patchy right basilar airspace opacities. No significant pleural effusion or visible pneumothorax. The visualized skeletal structures are unchanged. IMPRESSION: Patchy right basilar airspace opacities favor infection. Electronically Signed   By: Maudry Mayhew MD   On: 10/08/2020 13:21        Scheduled Meds: . aspirin EC  81 mg Oral QHS  . diltiazem  120 mg Oral Daily  . enoxaparin (LOVENOX) injection  40 mg Subcutaneous Q24H  . ipratropium-albuterol  3 mL Nebulization BID  . levothyroxine  25 mcg Oral Daily  . pantoprazole  40 mg Oral BID   Continuous Infusions: . lactated ringers Stopped (10/09/20 0604)  . piperacillin-tazobactam (ZOSYN)  IV 3.375 g (10/09/20 0550)     LOS: 0 days    Time spent: 35 minutes    Dorothyann Gibbs, Medical Student [facility]   If 7PM-7AM, please contact night-coverage [email or web address]  10/09/2020, 8:58 AM

## 2020-10-09 NOTE — Progress Notes (Signed)
Diltizem (cardizem) held per MD

## 2020-10-09 NOTE — Consult Note (Signed)
Referring Provider: [facility] Primary Care Physician:  Lindell Spar, MD Primary Gastroenterologist:  Dr. Laural Golden  Date of Admission: 10/08/2020 Date of Consultation: 10/09/2020  Reason for Consultation:  Esophageal stricture and recurrent aspiration  HPI:  Christian Ellison is a 60 y.o. male with a past medical history of anxiety, aspiration pneumonia, chronic pain, esophageal stricture, GERD, pulmonary edema, sleep apnea on CPAP therapy who presented the emergency department with complaints of choking episode 3 to 4 days ago and subsequent shallow breathing, elevated temperature, lethargy.  EMS was notified and reported to be hypoxic.  He was subsequently brought to the ER.  The patient's wife notes that he has been eating and laying down shortly thereafter recently.  In the emergency department he was febrile, tachycardic with CBC and c-Met unremarkable.  Lactic acid mildly elevated.  Chest x-ray with right lower lobe infiltrate presumed aspiration pneumonia and started on antibiotics.  GI was consulted to determine if EGD with repeat dilation is indicated.  Patient was last seen in the GI office 05/22/2019 for known history of esophageal dysphagia and aspiration pneumonia with barium study finding distal esophageal stricture status post previous dilation with persistent symptoms.  Underwent esophageal manometry at [facility] 06/16/2019 with normal peristalsis but elevated LES pressure, did not meet criterion for achalasia.  He was started in the solids and at that time.  Questionable compliance with diltiazem.  Persistent dysphagia at his last visit taking chewing and swallowing precautions, GERD improved on pantoprazole, planning CPAP initiation when the mask is fitted.  Most recent EGD completed 08/08/2020 which found fluid in the distal esophagus, with distal esophageal/lower esophageal sphincter spasticity status post dilation.  Recommended resume diltiazem 30 mg before meals  and arrange for esophageal manometry as an outpatient basis.  The patient was subsequently also referred to [facility] for evaluation and treatment.  Today he states he's doing ok this morning. States he is taking cardizem as recommended. Denies laying down after eating (though his wife states he does, as per above). Has an appointment next week at [facility] for evaluation of ongoing esophageal stricture. Denies abdominal pain, N/V, recent hematochezia or melena. No overt GI complaints today. States his breathing is doing good. Slept well last night with CPAP. Prefers to hold off on repeat EGD at this time given that it's been recently done and not effective. States "I appreciate everything Dr. Laural Golden has done, but we both agree he's taken it as far as he can and I'm supposed to see [facility] next week. I imaging they'll do a procedure there."  Past Medical History:  Diagnosis Date  . Anxiety   . Aspiration pneumonia ([place]) 05/26/2018   RLL  . Chronic pain   . Closed fracture of xiphoid process   . Esophageal stricture   . GERD (gastroesophageal reflux disease)   . 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   Marland Kitchen Opiate abuse, continuous ([place])   . Pulmonary edema 02/02/2018  . Sepsis due to pneumonia ([place]) 03/11/2019  . Sleep apnea    CPAP nightly    Past Surgical History:  Procedure Laterality Date  . BIOPSY  03/18/2018   Procedure: BIOPSY;  Surgeon: Rogene Houston, MD;  Location[facility];  Service: Endoscopy;;  gastric   . COLONOSCOPY N/A 11/22/2013   Dr. Laural Golden: Normal except for hemorrhoids.  Next colonoscopy 10 years.  . ESOPHAGEAL DILATION N/A 03/18/2018   Procedure: ESOPHAGEAL DILATION;  Surgeon:  Rogene Houston, MD;  Location[facility];  Service: Endoscopy;  Laterality: N/A;  . ESOPHAGEAL DILATION N/A 08/07/2020   Procedure: ESOPHAGEAL DILATION;  Surgeon: Rogene Houston, MD;  [facility];  Service: Endoscopy;   Laterality: N/A;  . ESOPHAGEAL MANOMETRY N/A 06/15/2018   Procedure: ESOPHAGEAL MANOMETRY (EM);  Surgeon: Mauri Pole, MD;  Location[facility];  Service: Endoscopy;  Laterality: N/A;  . ESOPHAGOGASTRODUODENOSCOPY (EGD) WITH PROPOFOL N/A 03/18/2018   Dr. Laural Golden: Benign-appearing mild esophageal stenosis proximal to the GE J, status post dilation.  2 cm hiatal hernia.  Gastritis, biopsies negative for H. pylori  . ESOPHAGOGASTRODUODENOSCOPY (EGD) WITH PROPOFOL N/A 08/07/2020   Procedure: ESOPHAGOGASTRODUODENOSCOPY (EGD) WITH PROPOFOL;  Surgeon: Rogene Houston, MD;  [facility];  Service: Endoscopy;  Laterality: N/A;  . Fracture Right Leg     Patient has rod/screws in this leg  . ORIF CALCANEOUS FRACTURE Left 07/04/2020   Procedure: OPEN REDUCTION INTERNAL FIXATION (ORIF) LEFT TALUS AND CALCANEOUS FRACTURE, SUBTALAR JOINT ARTHROTOMY WITH REMOVAL OF LOOSE BODIES, TALONAVICULAR JOINT ARTHOTOMY WITH REMOVAL OF LOOSE BODIES;  Surgeon: Erle Crocker, MD;  Location: [facility];  Service: Orthopedics;  Laterality: Left;  . Rotor Cuff  2012   Right Shoulder    Prior to Admission medications   Medication Sig Start Date End Date Taking? Authorizing Provider  acetaminophen (TYLENOL) 325 MG tablet Take 2 tablets (650 mg total) by mouth every 6 (six) hours as needed for mild pain (or Fever >/= 101). 05/17/19  Yes Emokpae, Courage, MD  albuterol (VENTOLIN HFA) 108 (90 Base) MCG/ACT inhaler Inhale 2 puffs into the lungs every 6 (six) hours as needed for wheezing or shortness of breath. 09/09/20  Yes Posey Pronto, Colin Broach, MD  ALPRAZolam Duanne Moron) 1 MG tablet Take 1 mg by mouth every 6 (six) hours as needed for anxiety.  10/22/19  Yes [provider]  aspirin EC 81 MG tablet Take 1 tablet (81 mg total) by mouth daily with breakfast. Patient taking differently: Take 81 mg by mouth at bedtime. 03/13/19  Yes Roxan Hockey, MD  Cimetidine (TAGAMET PO) Take 75 mg by mouth daily as needed (if  he feels his food coming up after meal).   Yes [provider]  diltiazem (DILACOR XR) 120 MG 24 hr capsule Take 1 capsule (120 mg total) by mouth daily. 08/17/20  Yes Danford, Suann Larry, MD  Glucosamine HCl 1000 MG TABS Take 1,000 mg by mouth at bedtime.   Yes [provider]  levothyroxine (SYNTHROID) 25 MCG tablet Take 1 tablet (25 mcg total) by mouth daily. 09/10/20  Yes Lindell Spar, MD  Multiple Vitamin (MULTIVITAMIN WITH MINERALS) TABS tablet Take 1 tablet by mouth daily. 50+   Yes [provider]  pantoprazole (PROTONIX) 40 MG tablet Take 1 tablet (40 mg total) by mouth 2 (two) times daily. 08/07/20 08/07/21 Yes Roxan Hockey, MD    Current Facility-Administered Medications  Medication Dose Route Frequency Provider Last Rate Last Admin  . acetaminophen (TYLENOL) tablet 650 mg  650 mg Oral Q6H PRN Kathie Dike, MD       Or  . acetaminophen (TYLENOL) suppository 650 mg  650 mg Rectal Q6H PRN Kathie Dike, MD      . ALPRAZolam Duanne Moron) tablet 0.5 mg  0.5 mg Oral BID PRN Kathie Dike, MD      . aspirin EC tablet 81 mg  81 mg Oral QHS Kathie Dike, MD   81 mg at 10/08/20  2235  . diltiazem (CARDIZEM CD) 24 hr capsule 120 mg  120 mg Oral Daily Memon, Jehanzeb, MD      . enoxaparin (LOVENOX) injection 40 mg  40 mg Subcutaneous Q24H Kathie Dike, MD   40 mg at 10/08/20 1854  . ipratropium-albuterol (DUONEB) 0.5-2.5 (3) MG/3ML nebulizer solution 3 mL  3 mL Nebulization BID Manuella Ghazi, Pratik D, DO      . lactated ringers infusion   Intravenous Continuous Kathie Dike, MD   Stopped at 10/09/20 8119  . levothyroxine (SYNTHROID) tablet 25 mcg  25 mcg Oral Daily Kathie Dike, MD   25 mcg at 10/09/20 0554  . ondansetron (ZOFRAN) tablet 4 mg  4 mg Oral Q6H PRN Kathie Dike, MD       Or  . ondansetron (ZOFRAN) injection 4 mg  4 mg Intravenous Q6H PRN Kathie Dike, MD      . pantoprazole (PROTONIX) EC tablet 40 mg  40 mg Oral BID Kathie Dike, MD    40 mg at 10/08/20 2236  . piperacillin-tazobactam (ZOSYN) IVPB 3.375 g  3.375 g Intravenous Q8H Pierce, Dwayne A, RPH 12.5 mL/hr at 10/09/20 0550 3.375 g at 10/09/20 0550    Allergies as of 10/08/2020  . (No Known Allergies)    Family History  Problem Relation Age of Onset  . Breast cancer Mother   . Kidney failure Mother   . Stroke Mother   . Colon cancer Father   . Bladder Cancer Father   . Prostate cancer Father   . Hypertension Sister   . Hypothyroidism Sister   . Healthy Sister   . Healthy Daughter   . Healthy Son   . Healthy Son     Social History   Socioeconomic History  . Marital status: Married    Spouse name: Not on file  . Number of children: Not on file  . Years of education: Not on file  . Highest education level: Not on file  Occupational History  . Occupation: unemployed  Tobacco Use  . Smoking status: Never Smoker  . Smokeless tobacco: Never Used  Vaping Use  . Vaping Use: Never used  Substance and Sexual Activity  . Alcohol use: Not Currently  . Drug use: Not Currently    Types: Benzodiazepines    Comment: opiates  . Sexual activity: Not on file  Other Topics Concern  . Not on file  Social History Narrative  . Not on file   Social Determinants of Health   Financial Resource Strain: Not on file  Food Insecurity: Not on file  Transportation Needs: Not on file  Physical Activity: Not on file  Stress: Not on file  Social Connections: Not on file  Intimate Partner Violence: Not on file    Review of Systems: General: Negative for anorexia, weight loss, fever, chills, fatigue, weakness. ENT: Negative for hoarseness, difficulty swallowing. CV: Negative for chest pain, angina, palpitations, peripheral edema.  Respiratory: Negative for dyspnea at rest, cough, sputum, wheezing.  GI: See history of present illness. Endo: Negative for unusual weight change.  Heme: Negative for bruising or bleeding. Allergy: Negative for rash or  hives.  Physical Exam: Vital signs in last 24 hours: Temp:  [97.5 F (36.4 C)-101.9 F (38.8 C)] 97.9 F (36.6 C) (02/23 0603) Pulse Rate:  [75-133] 76 (02/23 0603) Resp:  [5-33] 20 (02/23 0204) BP: (90-170)/(41-100) 96/41 (02/23 0603) SpO2:  [84 %-98 %] 94 % (02/23 0722) Weight:  [86.2 kg-92 kg] 92 kg (02/22 2049)   General:  Alert,  Well-developed, well-nourished, pleasant and cooperative in NAD Head:  Normocephalic and atraumatic. Eyes:  Sclera clear, no icterus. Conjunctiva pink. Ears:  Normal auditory acuity. Neck:  Supple; no masses or thyromegaly. Lungs:  Clear throughout to auscultation.   No wheezes, crackles, or rhonchi. No acute distress. Heart:  Regular rate and rhythm; no murmurs, clicks, rubs,  or gallops. Abdomen:  Soft, nontender and nondistended. No masses, hepatosplenomegaly or hernias noted. Normal bowel sounds, without guarding, and without rebound.   Rectal:  Deferred.   Msk:  Symmetrical without gross deformities. Pulses:  Normal bilateral DP pulses noted. Extremities:  Without clubbing or edema. Neurologic:  Alert and  oriented x4;  grossly normal neurologically. Psych:  Alert and cooperative. Normal mood and affect.  Intake/Output from previous day: 02/22 0701 - 02/23 0700 In: 3376.3 [I.V.:626.3; IV Piggyback:2750] Out: 900 [Urine:900] Intake/Output this shift: No intake/output data recorded.  Lab Results: Recent Labs    10/08/20 1316 10/08/20 1743 10/09/20 0444  WBC 5.4 8.9 8.3  HGB 14.5 12.7* 11.5*  HCT 44.1 38.4* 36.1*  PLT 150 154 146*   BMET Recent Labs    10/08/20 1316 10/08/20 1743 10/09/20 0444  NA 136  --  142  K 3.6  --  3.5  CL 100  --  103  CO2 27  --  30  GLUCOSE 120*  --  133*  BUN 11  --  15  CREATININE 0.84 0.73 0.78  CALCIUM 9.1  --  8.5*   LFT Recent Labs    10/08/20 1316  PROT 7.8  ALBUMIN 4.4  AST 21  ALT 17  ALKPHOS 63  BILITOT 0.8   PT/INR Recent Labs    10/08/20 1316  LABPROT 12.6  INR 1.0    Hepatitis Panel No results for input(s): HEPBSAG, HCVAB, HEPAIGM, HEPBIGM in the last 72 hours. C-Diff No results for input(s): CDIFFTOX in the last 72 hours.  Studies/Results: DG Chest Port 1 View  Result Date: 10/08/2020 CLINICAL DATA:  Lethargic and confuse, concern for sepsis EXAM: PORTABLE CHEST 1 VIEW COMPARISON:  Chest radiograph September 12, 2020 FINDINGS: The heart size and mediastinal contours are unchanged. Patchy right basilar airspace opacities. No significant pleural effusion or visible pneumothorax. The visualized skeletal structures are unchanged. IMPRESSION: Patchy right basilar airspace opacities favor infection. Electronically Signed   By: Dahlia Bailiff MD   On: 10/08/2020 13:21    Impression: Pleasant 60 year old male presents with a history of GERD, persistent esophageal stricture status post multiple EGDs most recently in December 2021. He presented due to to lethargy, shallow breathing, elevated temperature 3 to 4 days after a choking episode. Chest x-ray with right lower lobe infiltrate presumed aspiration pneumonia and started on antibiotics. GI was consulted to determine if repeat EGD is necessary.   Recurrent esophageal stricture: Patient states that he has been compliant with his medications, specifically Cardizem. Notes that he has had several EGDs and the stricture keeps coming back. He states that he sits up for at least an hour after eating, although his wife seem to dispute this yesterday. Significant esophageal work-up as noted in HPI, not meeting criteria for achalasia but does have a recurrent stricture obvious on EGD. Likely recurrent stricture and dysphagia with subsequent aspiration pneumonia. He does have an appointment at [facility] sometime next week for tertiary care evaluation and treatment. Does not seem to want EGD today as he feels that they will likely do a procedure of some sort at [facility] that would likely  provide more long-term effectiveness. I  agree. In the interim, dysphagia precautions will be paramount.  Aspiration pneumonia: He states his breathing is doing well, slept well on CPAP last night. His lungs sound clear on exam. Continuing to receive antibiotics. Defer management to hospitalist.  Plan: 1. Continue treatment of aspiration pneumonia per hospitalist 2. After every meal patient is to sit upright at least 60 degrees for minimum 2 hours after each meal 3. Chewing/swallowing precautions such as slow eating, adequate chewing 4. Stay on soft diet until evaluated at [facility] 5. Supportive care 6. Continue Cardizem and PPI   Thank you for allowing [place] to participate in the care of Kayle G Kerner  Walden Field, DNP, AGNP-C Adult & Gerontological Nurse Practitioner [facility]   LOS: 0 days     10/09/2020, 9:04 AM

## 2020-10-10 ENCOUNTER — Inpatient Hospital Stay (HOSPITAL_COMMUNITY): Payer: 59

## 2020-10-10 DIAGNOSIS — J690 Pneumonitis due to inhalation of food and vomit: Principal | ICD-10-CM

## 2020-10-10 LAB — URINE CULTURE: Culture: NO GROWTH

## 2020-10-10 LAB — BLOOD GAS, ARTERIAL
Acid-Base Excess: 6.3 mmol/L — ABNORMAL HIGH (ref 0.0–2.0)
Bicarbonate: 29.5 mmol/L — ABNORMAL HIGH (ref 20.0–28.0)
FIO2: 21
O2 Saturation: 84.9 %
Patient temperature: 37
pCO2 arterial: 45.3 mmHg (ref 32.0–48.0)
pH, Arterial: 7.442 (ref 7.350–7.450)
pO2, Arterial: 48 mmHg — ABNORMAL LOW (ref 83.0–108.0)

## 2020-10-10 LAB — PROCALCITONIN: Procalcitonin: 7.08 ng/mL

## 2020-10-10 NOTE — Progress Notes (Signed)
PROGRESS NOTE    Christian MealsJohn G Ellison  RUE:[id number] DOB: 1960-12-15 DOA: 10/08/2020 PCP: Anabel HalonPatel, Rutwik K, MD    Brief Narrative:  Mr. Christian Ellison is a 60 yo male with a history of esophageal stricture/dysmotility, recurrent aspiration PNA, sleep apnea who presented with AMS and fever 3 days after a choking event at home. CXR demonstrated fluffy infiltrates of RLL consistent with aspiration PNA. He is being treated with Zosyn but now with increasing O2 requirement to 6L.  GI defers EGD at this time. [facility] is [facility].    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 Febrile on presentation, but without fever for past 36 hours. Procalcitonin 7.08. Blood cx with no growth at 24 hours. CXR today essentially unchanged from previous. Aspiration likely 2/2 esophageal pathology - Continue Zosyn for now - Continue to follow blood cx - am CBC, BMP, procalcitonin  - Tylenol for fever  - Duonebs BID - Soft diet, aspiration precautions  Esophageal stricture/stenosis/dysmotility Patient is followed by Dr.Rehman. Seen yesterday by GI who defer EGD at this time.  - Continue Protonix - Continue homediltiazem - Outpatient follow-up with [facility] on 3/4.   Obstructive sleep apnea Patient initially refused hospital CPAP but his wife was able to bring home machine.  - Continue CPAP nightly  Hypothyroidism -Continue on Synthroid  Anxiety Dependent on benzodiazepines (Xanax) -Continue at reduced dose   DVT prophylaxis: enoxaparin (LOVENOX) injection 40 mg Start: 10/08/20 1730  Code Status: Full Family Communication: None at bedside, wife planning to visit  later in day Disposition Plan: Status is: Inpatient  Remains inpatient appropriate because:Inpatient level of care appropriate due to severity of illness   Dispo: The patient is from: Home              Anticipated d/c is to: Home              Patient currently is not medically stable to d/c.   Difficult to place patient No  Consultants:   GI  Procedures:   None  Antimicrobials:   Zosyn with dosing per pharmacy    Subjective: Mr. Christian Ellison reports feeling "alright" this morning. Says he is better today compared to yesterday, but still requiring 6L O2. He would like to leave, but after discussion of his increasing O2 requirement and elevated procalcitonin, he is amenable to staying.   Objective: Vitals:   10/09/20 2111 10/10/20 0444 10/10/20 0811 10/10/20 0900  BP: (!) 124/51 116/64  (!) 110/49  Pulse: 92 72  88  Resp: 20 (!) 21  16  Temp: 98.6 F (37 C) 100 F (37.8 C)    TempSrc: Oral Oral    SpO2: 90% (!) 86% 95% 96%  Weight:      Height:        Intake/Output Summary (Last 24 hours) at 10/10/2020 1106 Last data filed at 10/10/2020 0900 Gross per 24 hour  Intake 720 ml  Output --  Net 720 ml   Filed Weights   10/08/20 1202 10/08/20 2049  Weight: 86.2 kg 92 kg    Examination:  Physical Exam Constitutional:      General: He is not in acute distress. Cardiovascular:  Rate and Rhythm: Normal rate and regular rhythm.     Heart sounds: Normal heart sounds.  Pulmonary:     Comments: On 6L O2 Abdominal:     General: There is no distension.     Tenderness: There is no abdominal tenderness.  Musculoskeletal:        General: No swelling, tenderness or deformity.  Skin:    General: Skin is warm and dry.  Neurological:     General: No focal deficit present.     Mental Status: He is alert.  Psychiatric:        Mood and Affect: Mood normal.        Data Reviewed: I have personally reviewed following labs and imaging studies  CBC: Recent Labs   Lab 10/08/20 1316 10/08/20 1743 10/09/20 0444  WBC 5.4 8.9 8.3  NEUTROABS 4.7  --   --   HGB 14.5 12.7* 11.5*  HCT 44.1 38.4* 36.1*  MCV 88.2 87.9 90.5  PLT 150 154 146*   Basic Metabolic Panel: Recent Labs  Lab 10/08/20 1316 10/08/20 1743 10/09/20 0444  NA 136  --  142  K 3.6  --  3.5  CL 100  --  103  CO2 27  --  30  GLUCOSE 120*  --  133*  BUN 11  --  15  CREATININE 0.84 0.73 0.78  CALCIUM 9.1  --  8.5*   GFR: Estimated Creatinine Clearance: 110.5 mL/min (by C-G formula based on SCr of 0.78 mg/dL). Liver Function Tests: Recent Labs  Lab 10/08/20 1316  AST 21  ALT 17  ALKPHOS 63  BILITOT 0.8  PROT 7.8  ALBUMIN 4.4   No results for input(s): LIPASE, AMYLASE in the last 168 hours. Recent Labs  Lab 10/08/20 1319  AMMONIA 25   Coagulation Profile: Recent Labs  Lab 10/08/20 1316  INR 1.0   Cardiac Enzymes: No results for input(s): CKTOTAL, CKMB, CKMBINDEX, TROPONINI in the last 168 hours. BNP (last 3 results) No results for input(s): PROBNP in the last 8760 hours. HbA1C: No results for input(s): HGBA1C in the last 72 hours. CBG: No results for input(s): GLUCAP in the last 168 hours. Lipid Profile: No results for input(s): CHOL, HDL, LDLCALC, TRIG, CHOLHDL, LDLDIRECT in the last 72 hours. Thyroid Function Tests: No results for input(s): TSH, T4TOTAL, FREET4, T3FREE, THYROIDAB in the last 72 hours. Anemia Panel: No results for input(s): VITAMINB12, FOLATE, FERRITIN, TIBC, IRON, RETICCTPCT in the last 72 hours. Sepsis Labs: Recent Labs  Lab 10/08/20 1316 10/08/20 1520 10/10/20 0746  PROCALCITON  --   --  7.08  LATICACIDVEN 2.4* 1.9  --     Recent Results (from the past 240 hour(s))  Resp Panel by RT-PCR (Flu A&B, Covid) Nasopharyngeal Swab     Status: None   Collection Time: 10/08/20 12:29 PM   Specimen: Nasopharyngeal Swab; Nasopharyngeal(NP) swabs in vial transport medium  Result Value Ref Range Status   SARS Coronavirus 2 by RT PCR  NEGATIVE NEGATIVE Final    Comment: (NOTE) SARS-CoV-2 target nucleic acids are NOT DETECTED.  The SARS-CoV-2 RNA is generally detectable in upper respiratory specimens during the acute phase of infection. The lowest concentration of SARS-CoV-2 viral copies this assay can detect is 138 copies/mL. A negative result does not preclude SARS-Cov-2 infection and should not be used as the sole basis for treatment or other patient management decisions. A negative result may occur with  improper specimen collection/handling, submission of specimen other than nasopharyngeal swab, presence of  viral mutation(s) within the areas targeted by this assay, and inadequate number of viral copies(<138 copies/mL). A negative result must be combined with clinical observations, patient history, and epidemiological information. The expected result is Negative.  Fact Sheet for Patients:  BloggerCourse.com  Fact Sheet for Healthcare Providers:  SeriousBroker.it  This test is no t yet approved or cleared by the [place] FDA and  has been authorized for detection and/or diagnosis of SARS-CoV-2 by FDA under an Emergency Use Authorization (EUA). This EUA will remain  in effect (meaning this test can be used) for the duration of the COVID-19 declaration under Section 564(b)(1) of the Act, 21 U.S.C.section 360bbb-3(b)(1), unless the authorization is terminated  or revoked sooner.       Influenza A by PCR NEGATIVE NEGATIVE Final   Influenza B by PCR NEGATIVE NEGATIVE Final    Comment: (NOTE) The Xpert Xpress SARS-CoV-2/FLU/RSV plus assay is intended as an aid in the diagnosis of influenza from Nasopharyngeal swab specimens and should not be used as a sole basis for treatment. Nasal washings and aspirates are unacceptable for Xpert Xpress SARS-CoV-2/FLU/RSV testing.  Fact Sheet for Patients: BloggerCourse.com  Fact Sheet for  Healthcare Providers: SeriousBroker.it  This test is not yet approved or cleared by the [place] FDA and has been authorized for detection and/or diagnosis of SARS-CoV-2 by FDA under an Emergency Use Authorization (EUA). This EUA will remain in effect (meaning this test can be used) for the duration of the COVID-19 declaration under Section 564(b)(1) of the Act, 21 U.S.C. section 360bbb-3(b)(1), unless the authorization is terminated or revoked.  Performed at [facility], [address] [place], [place] [address]   Blood Culture (routine x 2)     Status: None (Preliminary result)   Collection Time: 10/08/20  1:10 PM   Specimen: BLOOD  Result Value Ref Range Status   Specimen Description BLOOD RIGHT ANTECUBITAL  Final   Special Requests   Final    BOTTLES DRAWN AEROBIC AND ANAEROBIC Blood Culture adequate volume   Culture   Final    NO GROWTH < 24 HOURS Performed at [facility], [address], [place], [place] [address]    Report Status PENDING  Incomplete  Blood Culture (routine x 2)     Status: None (Preliminary result)   Collection Time: 10/08/20  1:15 PM   Specimen: BLOOD LEFT HAND  Result Value Ref Range Status   Specimen Description BLOOD LEFT HAND  Final   Special Requests   Final    BOTTLES DRAWN AEROBIC AND ANAEROBIC Blood Culture adequate volume   Culture   Final    NO GROWTH < 24 HOURS Performed at [facility], [address], [place], [place] [address]    Report Status PENDING  Incomplete  Urine culture     Status: None   Collection Time: 10/08/20  4:21 PM   Specimen: Urine, Clean Catch  Result Value Ref Range Status   Specimen Description   Final    URINE, CLEAN CATCH Performed at [facility], [address], [place], [place] [address]    Special Requests   Final    NONE Performed at [facility] [address], [place], [place] [address]    Culture   Final    NO GROWTH Performed at [facility], [address], [place], [place] [address]    Report Status 10/10/2020 FINAL  Final  Culture, blood (routine x 2) Call MD if unable to obtain prior to antibiotics  being given     Status: None (Preliminary result)   Collection Time: 10/08/20  5:35 PM   Specimen: BLOOD LEFT HAND  Result Value Ref Range Status   Specimen Description BLOOD LEFT HAND  Final   Special Requests   Final    BOTTLES DRAWN AEROBIC AND ANAEROBIC Blood Culture adequate volume   Culture   Final    NO GROWTH < 24 HOURS Performed at [facility], [address], [place], [place] [address]    Report Status PENDING  Incomplete  Culture, blood (routine x 2) Call MD if unable to obtain prior to antibiotics being given     Status: None (Preliminary result)   Collection Time: 10/08/20  5:40 PM   Specimen: BLOOD RIGHT HAND  Result Value Ref Range Status   Specimen Description BLOOD RIGHT HAND  Final   Special Requests   Final    BOTTLES DRAWN AEROBIC AND ANAEROBIC Blood Culture adequate volume   Culture   Final    NO GROWTH < 24 HOURS Performed at [facility], [address], [place], [place] [address]    Report Status PENDING  Incomplete         Radiology Studies: DG Chest Port 1 View  Result Date: 10/08/2020 CLINICAL DATA:  Lethargic and confuse, concern for sepsis EXAM: PORTABLE CHEST 1 VIEW COMPARISON:  Chest radiograph September 12, 2020 FINDINGS: The heart size and mediastinal contours are unchanged. Patchy right basilar airspace opacities. No significant pleural effusion or visible pneumothorax. The visualized skeletal structures are unchanged. IMPRESSION: Patchy right basilar airspace opacities favor infection. Electronically Signed   By: Maudry Mayhew MD   On: 10/08/2020 13:21        Scheduled Meds: . aspirin EC  81 mg Oral QHS  . diltiazem  120 mg Oral Daily  . enoxaparin (LOVENOX) injection  40 mg Subcutaneous Q24H  . ipratropium-albuterol  3 mL Nebulization BID  . levothyroxine  25 mcg Oral Daily  . pantoprazole   40 mg Oral BID   Continuous Infusions: . lactated ringers Stopped (10/09/20 0604)  . piperacillin-tazobactam (ZOSYN)  IV 3.375 g (10/10/20 0615)     LOS: 1 day    Time spent: 30 minutes    Dorothyann Gibbs, Medical Student [facility]   If 7PM-7AM, please contact night-coverage [email or web address]  10/10/2020, 11:06 AM

## 2020-10-10 NOTE — Progress Notes (Signed)
RT Selena Batten came and gave patient his breathing treatment. He is currently on 4L. Patient is eating. Wife at bedside. Once he gets situated for the night respiratory is going to come back and put patient on CPAP. Dr. notified.

## 2020-10-10 NOTE — Progress Notes (Signed)
ABG was done on patient at 1906 and showed pH 7.442, PCO2 45.3, PO2 48.0, bicarb 29.5, FiO2 21.0  Patient was provided with breathing treatment and was placed on supplemental oxygen via [place] at 4 LPM with O2 sat at 92%, he was subsequently placed on CPAP due to OSA. RN reported that wife states that patient had 8 tablets of Xanax on him when admitted and that it appears that he already took 6 of the 8 tablets, which may be due to why patient was reported to be very lethargic during the day and also could have contributed to his hypoxia.  Nurse was asked to place a close monitoring of patient and to prevent any outside meds. ABG will be repeated to ensure improved oxygenation.

## 2020-10-10 NOTE — Progress Notes (Signed)
Patient placed on his home CPAP unit with 3L of O2 bled in.

## 2020-10-10 NOTE — Progress Notes (Signed)
Spoke to O. Adefeso MD regarding patient ABG results. He said to call respiratory to get patient switched to highflow or nonrebreather. Spoke to [employer] RT. Will continue to monitor patient for lethargy.

## 2020-10-10 NOTE — Progress Notes (Signed)
Subjective:  Wants to go home. States his breathing is better.   Objective: Vital signs in last 24 hours: Temp:  [98.6 F (37 C)-100 F (37.8 C)] 100 F (37.8 C) (02/24 0444) Pulse Rate:  [69-92] 72 (02/24 0444) Resp:  [20-21] 21 (02/24 0444) BP: (115-124)/(51-66) 116/64 (02/24 0444) SpO2:  [86 %-96 %] 95 % (02/24 0811)   General:   Alert,  Well-developed, well-nourished, pleasant and cooperative in NAD Head:  Normocephalic and atraumatic. Eyes:  Sclera clear, no icterus.  Chest: diminished breath sounds right lower lung.     Heart:  Regular rate and rhythm; no murmurs, clicks, rubs,  or gallops. Abdomen:  Soft, nontender and nondistended. Normal bowel sounds, without guarding, and without rebound.   Extremities:  Without clubbing, deformity or edema. Neurologic:  Alert and  oriented x4;  grossly normal neurologically. Skin:  Intact without significant lesions or rashes. Psych:  Alert and cooperative. Normal mood and affect.  Intake/Output from previous day: 02/23 0701 - 02/24 0700 In: 720 [P.O.:720] Out: -  Intake/Output this shift: No intake/output data recorded.  Lab Results: CBC Recent Labs    10/08/20 1316 10/08/20 1743 10/09/20 0444  WBC 5.4 8.9 8.3  HGB 14.5 12.7* 11.5*  HCT 44.1 38.4* 36.1*  MCV 88.2 87.9 90.5  PLT 150 154 146*   BMET Recent Labs    10/08/20 1316 10/08/20 1743 10/09/20 0444  NA 136  --  142  K 3.6  --  3.5  CL 100  --  103  CO2 27  --  30  GLUCOSE 120*  --  133*  BUN 11  --  15  CREATININE 0.84 0.73 0.78  CALCIUM 9.1  --  8.5*   LFTs Recent Labs    10/08/20 1316  BILITOT 0.8  ALKPHOS 63  AST 21  ALT 17  PROT 7.8  ALBUMIN 4.4   No results for input(s): LIPASE in the last 72 hours. PT/INR Recent Labs    10/08/20 1316  LABPROT 12.6  INR 1.0      Imaging Studies: DG Chest 2 View  Result Date: 09/12/2020 CLINICAL DATA:  Shortness of breath EXAM: CHEST - 2 VIEW COMPARISON:  08/16/2020 FINDINGS: The heart size and  mediastinal contours are within normal limits. Both lungs are clear. The visualized skeletal structures are unremarkable. IMPRESSION: No active cardiopulmonary disease. Electronically Signed   By: Donavan Foil M.D.   On: 09/12/2020 18:28   DG Chest Port 1 View  Result Date: 10/08/2020 CLINICAL DATA:  Lethargic and confuse, concern for sepsis EXAM: PORTABLE CHEST 1 VIEW COMPARISON:  Chest radiograph September 12, 2020 FINDINGS: The heart size and mediastinal contours are unchanged. Patchy right basilar airspace opacities. No significant pleural effusion or visible pneumothorax. The visualized skeletal structures are unchanged. IMPRESSION: Patchy right basilar airspace opacities favor infection. 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  Most recent EGD 07/2020 revealing spastic segment of the distal esophagus which was dilated.  Has been treated with Cardizem.  Presented to the ED via EMS for lethargy, shallow breathing, elevated temperature 3 to 4 days after choking episode.  Chest x-ray with right lower lobe infiltrate presumed aspiration pneumonia. Started on antibiotic therapy.  GI consult to determine if repeat EGD necessary.  Esophageal dysphagia secondary to esophageal  motility disorder: Esophageal manometry previously showed elevated LES pressure with incomplete relaxation but normal peristalsis with poor bolus clearance.  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  Query evolving achalasia.  May be a candidate for myotomy via POEM or laparoscopically.  Patient reports compliance with Cardizem. Prior EGDs with dilation but no improvement in dysphagia. Currently tolerating soft diet.  Appointment next week at [facility] for evaluation.  Aspiration  pneumonia: currently on zosyn. Management per attending.   Plan: 1. Recommend sitting upright at least 60 degrees for minimum of 2 hours after each meal. 2. Continue soft diet.  3. Aspiration precautions. 4. Continue Cardizem and PPI. 5. Keep appointment next week at [facility]. 6. Will sign off. Call with questions.   Laureen Ochs. Bernarda Caffey [facility] [phone] 2/24/202212:01 PM     LOS: 1 day

## 2020-10-11 ENCOUNTER — Encounter (INDEPENDENT_AMBULATORY_CARE_PROVIDER_SITE_OTHER): Payer: Self-pay

## 2020-10-11 ENCOUNTER — Encounter: Payer: Self-pay | Admitting: Internal Medicine

## 2020-10-11 LAB — CBC
HCT: 34.9 % — ABNORMAL LOW (ref 39.0–52.0)
Hemoglobin: 11.1 g/dL — ABNORMAL LOW (ref 13.0–17.0)
MCH: 28.6 pg (ref 26.0–34.0)
MCHC: 31.8 g/dL (ref 30.0–36.0)
MCV: 89.9 fL (ref 80.0–100.0)
Platelets: 176 10*3/uL (ref 150–400)
RBC: 3.88 MIL/uL — ABNORMAL LOW (ref 4.22–5.81)
RDW: 13.8 % (ref 11.5–15.5)
WBC: 5.6 10*3/uL (ref 4.0–10.5)
nRBC: 0 % (ref 0.0–0.2)

## 2020-10-11 LAB — BLOOD GAS, ARTERIAL
Acid-Base Excess: 7.3 mmol/L — ABNORMAL HIGH (ref 0.0–2.0)
Bicarbonate: 30.3 mmol/L — ABNORMAL HIGH (ref 20.0–28.0)
Drawn by: 22223
Expiratory PAP: 9
FIO2: 28
Mode: POSITIVE
O2 Saturation: 84.9 %
Patient temperature: 37
pCO2 arterial: 47.9 mmHg (ref 32.0–48.0)
pH, Arterial: 7.435 (ref 7.350–7.450)
pO2, Arterial: 50.2 mmHg — ABNORMAL LOW (ref 83.0–108.0)

## 2020-10-11 LAB — PROCALCITONIN: Procalcitonin: 3.19 ng/mL

## 2020-10-11 LAB — BASIC METABOLIC PANEL
Anion gap: 8 (ref 5–15)
BUN: 9 mg/dL (ref 6–20)
CO2: 32 mmol/L (ref 22–32)
Calcium: 8.7 mg/dL — ABNORMAL LOW (ref 8.9–10.3)
Chloride: 99 mmol/L (ref 98–111)
Creatinine, Ser: 0.77 mg/dL (ref 0.61–1.24)
GFR, Estimated: >60 mL/min (ref >60)
Glucose, Bld: 144 mg/dL — ABNORMAL HIGH (ref 70–99)
Potassium: 3.6 mmol/L (ref 3.5–5.1)
Sodium: 139 mmol/L (ref 135–145)

## 2020-10-11 MED ORDER — ALPRAZOLAM 1 MG PO TABS: 1.0000 mg | ORAL_TABLET | Freq: Every evening | ORAL | Status: DC | PRN

## 2020-10-11 MED ORDER — ALPRAZOLAM 0.5 MG PO TABS: 0.5000 mg | ORAL_TABLET | Freq: Every evening | ORAL | Status: DC | PRN

## 2020-10-11 NOTE — Progress Notes (Signed)
Pharmacy Antibiotic Note  Christian Ellison is a 60 y.o. male admitted on 10/08/2020 with aspiration pneumonia.  Pharmacy has been consulted for zosyn dosing.  Plan: Continue Zosyn 3.375gm IV q8h (EIN) Monitor for clinical course, LOT, and deescalation  Height: 6' (182.9 cm) Weight: 92 kg (202 lb 13.2 oz) IBW/kg (Calculated) : 77.6  Temp (24hrs), Avg:98.5 F (36.9 C), Min:98.2 F (36.8 C), Max:98.7 F (37.1 C)  Recent Labs  Lab 10/08/20 1316 10/08/20 1520 10/08/20 1743 10/09/20 0444 10/11/20 0510  WBC 5.4  --  8.9 8.3 5.6  CREATININE 0.84  --  0.73 0.78 0.77  LATICACIDVEN 2.4* 1.9  --   --   --     Estimated Creatinine Clearance: 110.5 mL/min (by C-G formula based on SCr of 0.77 mg/dL).    No Known Allergies  Antimicrobials this admission: Zosyn 2/22 >>  2/22 Bcx: ngtd 2/22 Ucx: ng 2/22 Sputum Cx: pending   Judeth Cornfield, PharmD Clinical Pharmacist 10/11/2020 8:04 AM

## 2020-10-11 NOTE — Progress Notes (Signed)
PROGRESS NOTE    Christian Ellison  YOV:[id number] DOB: 1961-02-23 DOA: 10/08/2020 PCP: Anabel Halon, MD   Brief Narrative:  Mr. Christian Ellison is a 60 yo male with a history of esophageal stricture/dysmotility, recurrent aspiration PNA, sleep apnea who presented with AMS and fever 3 days after a choking event at home. CXR demonstrated fluffy infiltrates of RLL consistent with aspiration PNA. He is being treated with Zosyn but now with increasing O2 requirement to 6L.  GI defers EGD at this time. He is scheduled to have one done at [facility] on 3/4.    Assessment & Plan:   Principal Problem:   Recurrent aspiration pneumonia (HCC) Active Problems:   Esophageal stricture/stenosis/esophageal dysmotility with recurrent aspiration   Hypothyroidism   Benzodiazepine dependence (HCC)   OSA on CPAP   Pneumonia   Elevated lactic acid level  Recurrent aspiration pneumonia O2 requirement increased to 6L. Febrile on presentation, but without fever for past 36 hours. Procalcitonin 7.08>3. Blood cx with no growth at 24 hours. CXR today essentially unchanged from previous. Aspiration likely 2/2 esophageal pathology -Continue Zosyn for now -Continue to follow blood cx which is negative - am CBC, BMP, procalcitonin  -Tylenol for fever  - Duonebs BID - Soft diet, aspiration precautions -Wean off O2 as tolerated -Xanax dose changed to bedtime  Esophageal stricture/stenosis/dysmotility Patient is followed by Dr.Rehman. Seen yesterday by GI who defer EGD at this time.  - Continue Protonix -Continue homediltiazem - Outpatient follow-up with [facility] on 3/4.   Obstructive sleep apnea Patient initially refused hospital CPAP but his wife was able to bring home machine. - Continue CPAP nightly  Hypothyroidism -Continue on Synthroid  Anxiety Dependent on benzodiazepines(Xanax) -Continue at reduced dose   DVT prophylaxis: enoxaparin (LOVENOX) injection 40 mg Start: 10/08/20 1730 Code  Status: Full Family Communication: Discussed with wife on phone 2/24 Disposition Plan: Status is: Inpatient  Remains inpatient appropriate because:Inpatient level of care appropriate due to severity of illness   Dispo: The patient is from: Home  Anticipated d/c is to: Home  Patient currently is not medically stable to d/c.              Difficult to place patient No  Consultants:   GI  Procedures:   None  Antimicrobials:  Anti-infectives (From admission, onward)   Start     Dose/Rate Route Frequency Ordered Stop   10/08/20 1730  piperacillin-tazobactam (ZOSYN) IVPB 3.375 g        3.375 g 12.5 mL/hr over 240 Minutes Intravenous Every 8 hours 10/08/20 1729     10/08/20 1430  azithromycin (ZITHROMAX) tablet 500 mg  Status:  Discontinued        500 mg Oral  Once 10/08/20 1424 10/08/20 1721   10/08/20 1230  cefTRIAXone (ROCEPHIN) 1 g in sodium chloride 0.9 % 100 mL IVPB        1 g 200 mL/hr over 30 Minutes Intravenous  Once 10/08/20 1225 10/08/20 1943      Subjective: Patient seen and evaluated today with no new acute complaints or concerns. No acute concerns or events noted overnight.  He is more awake and alert, but still mildly short of breath.  No fevers noted overnight.  Objective: Vitals:   10/10/20 2135 10/11/20 0140 10/11/20 0507 10/11/20 0818  BP: 117/75  (!) 123/56   Pulse: 96  77   Resp: 18  19   Temp: 98.2 F (36.8 C)  98.5 F (36.9 C)   TempSrc: Oral  Oral  SpO2: (!) 88% 91% 93% 95%  Weight:      Height:        Intake/Output Summary (Last 24 hours) at 10/11/2020 1129 Last data filed at 10/11/2020 0900 Gross per 24 hour  Intake 720 ml  Output 500 ml  Net 220 ml   Filed Weights   10/08/20 1202 10/08/20 2049  Weight: 86.2 kg 92 kg    Examination:  General exam: Appears calm and comfortable  Respiratory system: Clear to auscultation. Respiratory effort normal.  Currently on 5 L nasal cannula oxygen. Cardiovascular  system: S1 & S2 heard, RRR.  Gastrointestinal system: Abdomen is soft Central nervous system: Alert and awake Extremities: No edema Skin: No significant lesions noted Psychiatry: Flat affect.    Data Reviewed: I have personally reviewed following labs and imaging studies  CBC: Recent Labs  Lab 10/08/20 1316 10/08/20 1743 10/09/20 0444 10/11/20 0510  WBC 5.4 8.9 8.3 5.6  NEUTROABS 4.7  --   --   --   HGB 14.5 12.7* 11.5* 11.1*  HCT 44.1 38.4* 36.1* 34.9*  MCV 88.2 87.9 90.5 89.9  PLT 150 154 146* 176   Basic Metabolic Panel: Recent Labs  Lab 10/08/20 1316 10/08/20 1743 10/09/20 0444 10/11/20 0510  NA 136  --  142 139  K 3.6  --  3.5 3.6  CL 100  --  103 99  CO2 27  --  30 32  GLUCOSE 120*  --  133* 144*  BUN 11  --  15 9  CREATININE 0.84 0.73 0.78 0.77  CALCIUM 9.1  --  8.5* 8.7*   GFR: Estimated Creatinine Clearance: 110.5 mL/min (by C-G formula based on SCr of 0.77 mg/dL). Liver Function Tests: Recent Labs  Lab 10/08/20 1316  AST 21  ALT 17  ALKPHOS 63  BILITOT 0.8  PROT 7.8  ALBUMIN 4.4   No results for input(s): LIPASE, AMYLASE in the last 168 hours. Recent Labs  Lab 10/08/20 1319  AMMONIA 25   Coagulation Profile: Recent Labs  Lab 10/08/20 1316  INR 1.0   Cardiac Enzymes: No results for input(s): CKTOTAL, CKMB, CKMBINDEX, TROPONINI in the last 168 hours. BNP (last 3 results) No results for input(s): PROBNP in the last 8760 hours. HbA1C: No results for input(s): HGBA1C in the last 72 hours. CBG: No results for input(s): GLUCAP in the last 168 hours. Lipid Profile: No results for input(s): CHOL, HDL, LDLCALC, TRIG, CHOLHDL, LDLDIRECT in the last 72 hours. Thyroid Function Tests: No results for input(s): TSH, T4TOTAL, FREET4, T3FREE, THYROIDAB in the last 72 hours. Anemia Panel: No results for input(s): VITAMINB12, FOLATE, FERRITIN, TIBC, IRON, RETICCTPCT in the last 72 hours. Sepsis Labs: Recent Labs  Lab 10/08/20 1316  10/08/20 1520 10/10/20 0746 10/11/20 0510  PROCALCITON  --   --  7.08 3.19  LATICACIDVEN 2.4* 1.9  --   --     Recent Results (from the past 240 hour(s))  Resp Panel by RT-PCR (Flu A&B, Covid) Nasopharyngeal Swab     Status: None   Collection Time: 10/08/20 12:29 PM   Specimen: Nasopharyngeal Swab; Nasopharyngeal(NP) swabs in vial transport medium  Result Value Ref Range Status   SARS Coronavirus 2 by RT PCR NEGATIVE NEGATIVE Final    Comment: (NOTE) SARS-CoV-2 target nucleic acids are NOT DETECTED.  The SARS-CoV-2 RNA is generally detectable in upper respiratory specimens during the acute phase of infection. The lowest concentration of SARS-CoV-2 viral copies this assay can detect is 138 copies/mL. A negative result  does not preclude SARS-Cov-2 infection and should not be used as the sole basis for treatment or other patient management decisions. A negative result may occur with  improper specimen collection/handling, submission of specimen other than nasopharyngeal swab, presence of viral mutation(s) within the areas targeted by this assay, and inadequate number of viral copies(<138 copies/mL). A negative result must be combined with clinical observations, patient history, and epidemiological information. The expected result is Negative.  Fact Sheet for Patients:  BloggerCourse.comhttps://www.fda.gov/media/152166/download  Fact Sheet for Healthcare Providers:  SeriousBroker.ithttps://www.fda.gov/media/152162/download  This test is no t yet approved or cleared by the [place] FDA and  has been authorized for detection and/or diagnosis of SARS-CoV-2 by FDA under an Emergency Use Authorization (EUA). This EUA will remain  in effect (meaning this test can be used) for the duration of the COVID-19 declaration under Section 564(b)(1) of the Act, 21 U.S.C.section 360bbb-3(b)(1), unless the authorization is terminated  or revoked sooner.       Influenza A by PCR NEGATIVE NEGATIVE Final   Influenza B by  PCR NEGATIVE NEGATIVE Final    Comment: (NOTE) The Xpert Xpress SARS-CoV-2/FLU/RSV plus assay is intended as an aid in the diagnosis of influenza from Nasopharyngeal swab specimens and should not be used as a sole basis for treatment. Nasal washings and aspirates are unacceptable for Xpert Xpress SARS-CoV-2/FLU/RSV testing.  Fact Sheet for Patients: BloggerCourse.comhttps://www.fda.gov/media/152166/download  Fact Sheet for Healthcare Providers: SeriousBroker.ithttps://www.fda.gov/media/152162/download  This test is not yet approved or cleared by the [place] FDA and has been authorized for detection and/or diagnosis of SARS-CoV-2 by FDA under an Emergency Use Authorization (EUA). This EUA will remain in effect (meaning this test can be used) for the duration of the COVID-19 declaration under Section 564(b)(1) of the Act, 21 U.S.C. section 360bbb-3(b)(1), unless the authorization is terminated or revoked.  Performed at [facility], [address] [place], [place] [address]   Blood Culture (routine x 2)     Status: None (Preliminary result)   Collection Time: 10/08/20  1:10 PM   Specimen: BLOOD  Result Value Ref Range Status   Specimen Description BLOOD RIGHT ANTECUBITAL  Final   Special Requests   Final    BOTTLES DRAWN AEROBIC AND ANAEROBIC Blood Culture adequate volume   Culture   Final    NO GROWTH 3 DAYS Performed at [facility], [address] [place], [place] [address]    Report Status PENDING  Incomplete  Blood Culture (routine x 2)     Status: None (Preliminary result)   Collection Time: 10/08/20  1:15 PM   Specimen: BLOOD LEFT HAND  Result Value Ref Range Status   Specimen Description BLOOD LEFT HAND  Final   Special Requests   Final    BOTTLES DRAWN AEROBIC AND ANAEROBIC Blood Culture adequate volume   Culture   Final    NO GROWTH 3 DAYS Performed at [facility], [address] [place], [place] [address]    Report Status PENDING  Incomplete  Urine culture     Status: None    Collection Time: 10/08/20  4:21 PM   Specimen: Urine, Clean Catch  Result Value Ref Range Status   Specimen Description   Final    URINE, CLEAN CATCH Performed at [facility], [address] [place], [place] [address]    Special Requests   Final    NONE Performed at [facility], [address] [place], [place] [address]    Culture   Final    NO  GROWTH Performed at [facility], [address] [place], [place] [address]    Report Status 10/10/2020 FINAL  Final  Culture, blood (routine x 2) Call MD if unable to obtain prior to antibiotics being given     Status: None (Preliminary result)   Collection Time: 10/08/20  5:35 PM   Specimen: BLOOD LEFT HAND  Result Value Ref Range Status   Specimen Description BLOOD LEFT HAND  Final   Special Requests   Final    BOTTLES DRAWN AEROBIC AND ANAEROBIC Blood Culture adequate volume   Culture   Final    NO GROWTH 3 DAYS Performed at [facility], [address] [place], [place] [address]    Report Status PENDING  Incomplete  Culture, blood (routine x 2) Call MD if unable to obtain prior to antibiotics being given     Status: None (Preliminary result)   Collection Time: 10/08/20  5:40 PM   Specimen: BLOOD RIGHT HAND  Result Value Ref Range Status   Specimen Description BLOOD RIGHT HAND  Final   Special Requests   Final    BOTTLES DRAWN AEROBIC AND ANAEROBIC Blood Culture adequate volume   Culture   Final    NO GROWTH 3 DAYS Performed at [facility], [address] [place], [place] [address]    Report Status PENDING  Incomplete         Radiology Studies: DG Chest Port 1 View  Result Date: 10/10/2020 CLINICAL DATA:  Hypoxia.  Respiratory failure. EXAM: PORTABLE CHEST 1 VIEW COMPARISON:  07/08/2021. FINDINGS: Heart size normal. Persistent right mid lung and base infiltrate most consistent with pneumonia. No interim change. No pleural effusion or pneumothorax. Degenerative change thoracic spine. IMPRESSION: Persistent right  mid lung and base infiltrate most consistent with pneumonia. No interim change. Electronically Signed   By: Maisie Fus  Register   On: 10/10/2020 11:24        Scheduled Meds: . aspirin EC  81 mg Oral QHS  . diltiazem  120 mg Oral Daily  . enoxaparin (LOVENOX) injection  40 mg Subcutaneous Q24H  . ipratropium-albuterol  3 mL Nebulization BID  . levothyroxine  25 mcg Oral Daily  . pantoprazole  40 mg Oral BID   Continuous Infusions: . lactated ringers 75 mL/hr at 10/11/20 0247  . piperacillin-tazobactam (ZOSYN)  IV 3.375 g (10/11/20 0542)     LOS: 2 days    Time spent: 35 minutes    [name] D Sherryll Burger, DO [facility]  If 7PM-7AM, please contact night-coverage [email or web address] 10/11/2020, 11:29 AM

## 2020-10-11 NOTE — Progress Notes (Signed)
Patient was taken off CPAP unit after he has complained all evening about the mask and how it fit. RT explained this was the only mask available until his  wife could bring him one from home. Patient placed back on O2 at 5lpm [place] after showing him several time how to unhook and hook mask. Patient was threaten to broke another mask if he couldn't get mask off. RT will continue to monitor patient with RN.

## 2020-10-11 NOTE — Plan of Care (Signed)

## 2020-10-11 NOTE — TOC Initial Note (Signed)
Transition of Care [facility]) - Initial/Assessment Note    Patient Details  Name: Christian Ellison MRN: [id number] Date of Birth: 1961-02-06  [facility] CM/SW Contact:    Leitha Bleak, RN Phone Number: 10/11/2020, 11:37 AM  Clinical Narrative:   Patient admitted for recurrent aspiration pneumonia, High score for readmission, known to TOC. Patient has follow up appointment with gastroenterology. Patient is on oxygen at present. Needs one more day inpatient.       Expected Discharge Plan: Home/Self Care Barriers to Discharge: No Barriers Identified   Expected Discharge Plan and Services Expected Discharge Plan: Home/Self Care    Activities of Daily Living Home Assistive Devices/Equipment: CPAP ADL Screening (condition at time of admission) Patient's cognitive ability adequate to safely complete daily activities?: Yes Is the patient deaf or have difficulty hearing?: No Does the patient have difficulty seeing, even when wearing glasses/contacts?: No Does the patient have difficulty concentrating, remembering, or making decisions?: No Patient able to express need for assistance with ADLs?: Yes Does the patient have difficulty dressing or bathing?: No Independently performs ADLs?: Yes (appropriate for developmental age) Does the patient have difficulty walking or climbing stairs?: No Weakness of Legs: None Weakness of Arms/Hands: None    Orientation: : Oriented to Self,Oriented to Place,Oriented to  Time,Oriented to Situation   Psych Involvement: No (comment)  Admission diagnosis:  Pneumonia [J18.9] Elevated lactic acid level [R79.89] Pneumonia of both lungs due to infectious organism, unspecified part of lung [J18.9] Patient Active Problem List   Diagnosis Date Noted  . Pneumonia 10/09/2020  . Elevated lactic acid level   . GERD (gastroesophageal reflux disease)   . Esophageal motility disorder   . Alcohol abuse, in remission 08/30/2020  . Gastroesophageal  reflux disease with esophagitis 08/30/2020  . Moderate recurrent major depression (HCC) 08/30/2020  . Encounter to establish care 08/30/2020  . OSA on CPAP 08/30/2020  . Anxiety 06/12/2020  . Benzodiazepine dependence (HCC) 06/12/2020  . History of substance abuse (HCC) 06/12/2020  . Hypothyroidism 03/11/2019  . Recurrent aspiration pneumonia (HCC)   . Essential hypertension 02/02/2018  . CHF (congestive heart failure) (HCC) 02/02/2018  . Dysphagia 02/02/2018  . Esophageal stricture/stenosis/esophageal dysmotility with recurrent aspiration 02/02/2018  . Esophageal stenosis 02/02/2018  . Transaminitis   . Hx of hepatitis C 01/17/2013  . Depression 01/17/2013  . Anxiety with depression 01/17/2013   PCP:  Anabel Halon, MD Pharmacy:   [phone] [place], [place] - [address] [facility] [address] [place] [address] Phone: [phone] Fax: [phone]    Readmission Risk Interventions Readmission Risk Prevention Plan 10/11/2020 08/06/2020 05/17/2019  Transportation Screening Complete Complete Complete  PCP or Specialist Appt within 3-5 Days - - -  HRI or Home Care Consult - Complete -  Social Work Consult for Recovery Care Planning/Counseling - Complete -  Palliative Care Screening - Not Applicable -  Medication Review (RN Care Manager) Complete Complete Complete  PCP or Specialist appointment within 3-5 days of discharge Complete - Not Complete  PCP/Specialist Appt Not Complete comments - - patient must make appt. due to previous no shows when schedule by [facility] team  HRI or Home Care Consult Not Complete - Not Complete  HRI or Home Care Consult Pt Refusal Comments no barriers - declined  SW Recovery Care/Counseling Consult Not Complete - Complete  SW Consult Not Complete Comments no barriers - -  Palliative Care Screening Not Applicable - Not Applicable  Skilled Nursing Facility Not Applicable - Not Applicable  Some recent data might  be hidden

## 2020-10-12 LAB — CBC
HCT: 34.2 % — ABNORMAL LOW (ref 39.0–52.0)
Hemoglobin: 11.3 g/dL — ABNORMAL LOW (ref 13.0–17.0)
MCH: 29.5 pg (ref 26.0–34.0)
MCHC: 33 g/dL (ref 30.0–36.0)
MCV: 89.3 fL (ref 80.0–100.0)
Platelets: 187 10*3/uL (ref 150–400)
RBC: 3.83 MIL/uL — ABNORMAL LOW (ref 4.22–5.81)
RDW: 13.4 % (ref 11.5–15.5)
WBC: 4.8 10*3/uL (ref 4.0–10.5)
nRBC: 0 % (ref 0.0–0.2)

## 2020-10-12 LAB — PROCALCITONIN: Procalcitonin: 1.56 ng/mL

## 2020-10-12 MED ORDER — AMOXICILLIN-POT CLAVULANATE 875-125 MG PO TABS: 1.0000 | ORAL_TABLET | Freq: Two times a day (BID) | ORAL | 0 refills | Status: AC

## 2020-10-12 NOTE — Progress Notes (Signed)
Patient with no complaints at this time. Respirations even and unlabored. Skin warm/dry. Discharge instructions reviewed with patient at this time. Patient given opportunity to voice concerns/ask questions. IV removed per policy and band-aid applied to site. Patient awaiting ride at this time

## 2020-10-12 NOTE — Discharge Summary (Signed)
Physician Discharge Summary  Christian Ellison ZOX:[id number] DOB: 12/27/1960 DOA: 10/08/2020  PCP: Anabel Halon, MD  Admit date: 10/08/2020  Discharge date: 10/12/2020  Admitted From:Home  Disposition:  Home  Recommendations for Outpatient Follow-up:  1. Follow up with PCP in 1-2 weeks 2. Please obtain BMP/CBC in one week 3. Follow-up at [facility] on 3/4 scheduled for endoscopy evaluation 4. Continue on Augmentin for 4 more days as prescribed to complete course of treatment for aspiration pneumonia  Home Health: None  Equipment/Devices:None  Discharge Condition:Stable  CODE STATUS: Full  Diet recommendation: Heart Healthy  Brief/Interim Summary: Christian Ellison is a 60 yo male with a history of esophageal stricture/dysmotility, recurrent aspiration PNA, sleep apnea who presented with AMS and fever 3 days after a choking event at home. CXR demonstrated fluffy infiltrates of RLL consistent with aspiration PNA.  Patient was treated with IV Zosyn for period of 3 days with no further fever noted and procalcitonin level and leukocytosis noted to be decreasing.  He has no other significant respiratory symptoms and is now on room air, whereas before was requiring up to 6 L of oxygen.  Due to his aspiration episode and concern for esophageal stricture, he was seen by GI, but was noted to have an upcoming EGD on 3/4 at [facility] and patient prefers to keep this appointment and have follow-up there.  Therefore, EGD was deferred.  He has been otherwise been tolerating diet well.  No other acute events noted and he will continue on Augmentin as prescribed to complete the course of treatment.  Overall stable for discharge with no other symptomatic complaints or concerns noted.   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  Discharge Instructions  Discharge Instructions    Diet - low sodium heart healthy   Complete by: As directed    Increase activity slowly   Complete by: As directed      Allergies as of 10/12/2020   No Known Allergies     Medication List    TAKE these medications   acetaminophen 325 MG tablet Commonly known as: TYLENOL Take 2 tablets (650 mg total) by mouth every 6 (six) hours as needed for mild pain (or Fever >/= 101).   albuterol 108 (90 Base) MCG/ACT inhaler Commonly known as: VENTOLIN HFA Inhale 2 puffs into the lungs every 6 (six) hours as needed for wheezing or shortness of breath.   ALPRAZolam 1 MG tablet Commonly known as: XANAX Take 1 mg by mouth every 6 (six) hours as needed for anxiety.   amoxicillin-clavulanate 875-125 MG tablet Commonly known as: Augmentin Take 1 tablet by mouth 2 (two) times daily for 4 days.   aspirin EC 81 MG tablet Take 1 tablet (81 mg total) by mouth daily with breakfast. What changed: when to take this   diltiazem 120 MG 24 hr capsule Commonly known as: DILACOR XR Take 1 capsule (120 mg total) by mouth daily.   Glucosamine HCl 1000 MG Tabs Take 1,000 mg by mouth at bedtime.   levothyroxine 25 MCG tablet Commonly known as: SYNTHROID Take 1 tablet (25 mcg total) by mouth daily.   multivitamin with minerals Tabs tablet Take  1 tablet by mouth daily. 50+   pantoprazole 40 MG tablet Commonly known as: Protonix Take 1 tablet (40 mg total) by mouth 2 (two) times daily.   TAGAMET PO Take 75 mg by mouth daily as needed (if he feels his food coming up after meal).       Follow-up Information    Anabel Halon, MD Follow up in 1 week(s).   Specialty: Internal Medicine Contact information: [address] [place] [address] [phone]              No Known Allergies  Consultations:  GI   Procedures/Studies: DG Chest 2 View  Result Date: 09/12/2020 CLINICAL DATA:  Shortness of breath EXAM: CHEST - 2 VIEW COMPARISON:  08/16/2020 FINDINGS: The heart size and mediastinal contours are within normal limits. Both lungs are clear. The visualized skeletal structures are unremarkable. IMPRESSION: No active cardiopulmonary disease. Electronically Signed   By: Jasmine Pang M.D.   On: 09/12/2020 18:28   DG Chest Port 1 View  Result Date: 10/10/2020 CLINICAL DATA:  Hypoxia.  Respiratory failure. EXAM: PORTABLE CHEST 1 VIEW COMPARISON:  07/08/2021. FINDINGS: Heart size normal. Persistent right mid lung and base infiltrate most consistent with pneumonia. No interim change. No pleural effusion or pneumothorax. Degenerative change thoracic spine. IMPRESSION: Persistent right mid lung and base infiltrate most consistent with pneumonia. No interim change. Electronically Signed   By: Maisie Fus  Register   On: 10/10/2020 11:24   DG Chest Port 1 View  Result Date: 10/08/2020 CLINICAL DATA:  Lethargic and confuse, concern for sepsis EXAM: PORTABLE CHEST 1 VIEW COMPARISON:  Chest radiograph September 12, 2020 FINDINGS: The heart size and mediastinal contours are unchanged. Patchy right basilar airspace opacities. No significant pleural effusion or visible pneumothorax. The visualized skeletal structures are unchanged. IMPRESSION: Patchy right basilar airspace opacities favor infection. Electronically Signed   By: Maudry Mayhew MD   On: 10/08/2020 13:21      Discharge Exam: Vitals:   10/12/20 0759 10/12/20 1000  BP:  111/73  Pulse:  72  Resp:    Temp:    SpO2: (!) 83% 94%   Vitals:   10/11/20 2146 10/12/20 0545 10/12/20 0759 10/12/20 1000  BP: 124/75 132/74  111/73  Pulse: 83 83  72  Resp: 18 16    Temp: 98.3 F (36.8 C) 97.8 F (36.6 C)    TempSrc: Oral Oral    SpO2: 94% 97% (!) 83%  94%  Weight:      Height:        General: Pt is alert, awake, not in acute distress Cardiovascular: RRR, S1/S2 +, no rubs, no gallops Respiratory: CTA bilaterally, no wheezing, no rhonchi Abdominal: Soft, NT, ND, bowel sounds + Extremities: no edema, no cyanosis    The results of significant diagnostics from this hospitalization (including imaging, microbiology, ancillary and laboratory) are listed below for reference.     Microbiology: Recent Results (from the past 240 hour(s))  Resp Panel by RT-PCR (Flu A&B, Covid) Nasopharyngeal Swab     Status: None   Collection Time: 10/08/20 12:29 PM   Specimen: Nasopharyngeal Swab; Nasopharyngeal(NP) swabs in vial transport medium  Result Value Ref Range Status   SARS Coronavirus 2 by RT PCR NEGATIVE NEGATIVE Final    Comment: (NOTE) SARS-CoV-2 target nucleic acids are NOT DETECTED.  The SARS-CoV-2 RNA is generally detectable in upper respiratory specimens during the acute phase of infection. The lowest concentration of SARS-CoV-2 viral copies this assay can detect  is 138 copies/mL. A negative result does not preclude SARS-Cov-2 infection and should not be used as the sole basis for treatment or other patient management decisions. A negative result may occur with  improper specimen collection/handling, submission of specimen other than nasopharyngeal swab, presence of viral mutation(s) within the areas targeted by this assay, and inadequate number of viral copies(<138 copies/mL). A negative result must be combined with clinical observations, patient history, and epidemiological information. The expected result is Negative.  Fact Sheet for Patients:  BloggerCourse.com  Fact Sheet for Healthcare Providers:  SeriousBroker.it  This test is no t yet approved or cleared by the [place] FDA and  has been authorized for detection and/or diagnosis of SARS-CoV-2 by FDA under an  Emergency Use Authorization (EUA). This EUA will remain  in effect (meaning this test can be used) for the duration of the COVID-19 declaration under Section 564(b)(1) of the Act, 21 U.S.C.section 360bbb-3(b)(1), unless the authorization is terminated  or revoked sooner.       Influenza A by PCR NEGATIVE NEGATIVE Final   Influenza B by PCR NEGATIVE NEGATIVE Final    Comment: (NOTE) The Xpert Xpress SARS-CoV-2/FLU/RSV plus assay is intended as an aid in the diagnosis of influenza from Nasopharyngeal swab specimens and should not be used as a sole basis for treatment. Nasal washings and aspirates are unacceptable for Xpert Xpress SARS-CoV-2/FLU/RSV testing.  Fact Sheet for Patients: BloggerCourse.com  Fact Sheet for Healthcare Providers: SeriousBroker.it  This test is not yet approved or cleared by the [place] FDA and has been authorized for detection and/or diagnosis of SARS-CoV-2 by FDA under an Emergency Use Authorization (EUA). This EUA will remain in effect (meaning this test can be used) for the duration of the COVID-19 declaration under Section 564(b)(1) of the Act, 21 U.S.C. section 360bbb-3(b)(1), unless the authorization is terminated or revoked.  Performed at [facility], [address] [place], [place] [address]   Blood Culture (routine x 2)     Status: None (Preliminary result)   Collection Time: 10/08/20  1:10 PM   Specimen: BLOOD  Result Value Ref Range Status   Specimen Description BLOOD RIGHT ANTECUBITAL  Final   Special Requests   Final    BOTTLES DRAWN AEROBIC AND ANAEROBIC Blood Culture adequate volume   Culture   Final    NO GROWTH 4 DAYS Performed at [facility], [address], [place], [place] [address]    Report Status PENDING  Incomplete  Blood Culture (routine x 2)     Status: None (Preliminary result)   Collection Time: 10/08/20  1:15 PM   Specimen: BLOOD LEFT HAND  Result Value Ref Range  Status   Specimen Description BLOOD LEFT HAND  Final   Special Requests   Final    BOTTLES DRAWN AEROBIC AND ANAEROBIC Blood Culture adequate volume   Culture   Final    NO GROWTH 4 DAYS Performed at [facility], [address] [place], [place] [address]    Report Status PENDING  Incomplete  Urine culture     Status: None   Collection Time: 10/08/20  4:21 PM   Specimen: Urine, Clean Catch  Result Value Ref Range Status   Specimen Description   Final    URINE, CLEAN CATCH Performed at [facility], [address] [place], [place] [address]    Special Requests   Final    NONE Performed at [facility], [address], [place], [place] [address]    Culture  Final    NO GROWTH Performed at [facility], [address] [place], [place] [address]    Report Status 10/10/2020 FINAL  Final  Culture, blood (routine x 2) Call MD if unable to obtain prior to antibiotics being given     Status: None (Preliminary result)   Collection Time: 10/08/20  5:35 PM   Specimen: BLOOD LEFT HAND  Result Value Ref Range Status   Specimen Description BLOOD LEFT HAND  Final   Special Requests   Final    BOTTLES DRAWN AEROBIC AND ANAEROBIC Blood Culture adequate volume   Culture   Final    NO GROWTH 4 DAYS Performed at [facility], [address] [place], [place] [address]    Report Status PENDING  Incomplete  Culture, blood (routine x 2) Call MD if unable to obtain prior to antibiotics being given     Status: None (Preliminary result)   Collection Time: 10/08/20  5:40 PM   Specimen: BLOOD RIGHT HAND  Result Value Ref Range Status   Specimen Description BLOOD RIGHT HAND  Final   Special Requests   Final    BOTTLES DRAWN AEROBIC AND ANAEROBIC Blood Culture adequate volume   Culture   Final    NO GROWTH 4 DAYS Performed at [facility], [address] [place], [place] [address]    Report Status PENDING  Incomplete     Labs: BNP (last 3 results) Recent Labs    10/29/19 1736  04/21/20 1322  BNP 29.0 23.0   Basic Metabolic Panel: Recent Labs  Lab 10/08/20 1316 10/08/20 1743 10/09/20 0444 10/11/20 0510  NA 136  --  142 139  K 3.6  --  3.5 3.6  CL 100  --  103 99  CO2 27  --  30 32  GLUCOSE 120*  --  133* 144*  BUN 11  --  15 9  CREATININE 0.84 0.73 0.78 0.77  CALCIUM 9.1  --  8.5* 8.7*   Liver Function Tests: Recent Labs  Lab 10/08/20 1316  AST 21  ALT 17  ALKPHOS 63  BILITOT 0.8  PROT 7.8  ALBUMIN 4.4   No results for input(s): LIPASE, AMYLASE in the last 168 hours. Recent Labs  Lab 10/08/20 1319  AMMONIA 25   CBC: Recent Labs  Lab 10/08/20 1316 10/08/20 1743 10/09/20 0444 10/11/20 0510 10/12/20 0440  WBC 5.4 8.9 8.3 5.6 4.8  NEUTROABS 4.7  --   --   --   --   HGB 14.5 12.7* 11.5* 11.1* 11.3*  HCT 44.1 38.4* 36.1* 34.9* 34.2*  MCV 88.2 87.9 90.5 89.9 89.3  PLT 150 154 146* 176 187   Cardiac Enzymes: No results for input(s): CKTOTAL, CKMB, CKMBINDEX, TROPONINI in the last 168 hours. BNP: Invalid input(s): POCBNP CBG: No results for input(s): GLUCAP in the last 168 hours. D-Dimer No results for input(s): DDIMER in the last 72 hours. Hgb A1c No results for input(s): HGBA1C in the last 72 hours. Lipid Profile No results for input(s): CHOL, HDL, LDLCALC, TRIG, CHOLHDL, LDLDIRECT in the last 72 hours. Thyroid function studies No results for input(s): TSH, T4TOTAL, T3FREE, THYROIDAB in the last 72 hours.  Invalid input(s): FREET3 Anemia work up No results for input(s): VITAMINB12, FOLATE, FERRITIN, TIBC, IRON, RETICCTPCT in the last 72 hours. Urinalysis    Component Value Date/Time   COLORURINE STRAW (A) 10/08/2020 1621   APPEARANCEUR CLEAR 10/08/2020 1621   LABSPEC 1.006 10/08/2020 1621   PHURINE 6.0 10/08/2020 1621  GLUCOSEU NEGATIVE 10/08/2020 1621   HGBUR SMALL (A) 10/08/2020 1621   BILIRUBINUR NEGATIVE 10/08/2020 1621   KETONESUR NEGATIVE 10/08/2020 1621   PROTEINUR NEGATIVE 10/08/2020 1621   NITRITE NEGATIVE  10/08/2020 1621   LEUKOCYTESUR NEGATIVE 10/08/2020 1621   Sepsis Labs Invalid input(s): PROCALCITONIN,  WBC,  LACTICIDVEN Microbiology Recent Results (from the past 240 hour(s))  Resp Panel by RT-PCR (Flu A&B, Covid) Nasopharyngeal Swab     Status: None   Collection Time: 10/08/20 12:29 PM   Specimen: Nasopharyngeal Swab; Nasopharyngeal(NP) swabs in vial transport medium  Result Value Ref Range Status   SARS Coronavirus 2 by RT PCR NEGATIVE NEGATIVE Final    Comment: (NOTE) SARS-CoV-2 target nucleic acids are NOT DETECTED.  The SARS-CoV-2 RNA is generally detectable in upper respiratory specimens during the acute phase of infection. The lowest concentration of SARS-CoV-2 viral copies this assay can detect is 138 copies/mL. A negative result does not preclude SARS-Cov-2 infection and should not be used as the sole basis for treatment or other patient management decisions. A negative result may occur with  improper specimen collection/handling, submission of specimen other than nasopharyngeal swab, presence of viral mutation(s) within the areas targeted by this assay, and inadequate number of viral copies(<138 copies/mL). A negative result must be combined with clinical observations, patient history, and epidemiological information. The expected result is Negative.  Fact Sheet for Patients:  BloggerCourse.com  Fact Sheet for Healthcare Providers:  SeriousBroker.it  This test is no t yet approved or cleared by the [place] FDA and  has been authorized for detection and/or diagnosis of SARS-CoV-2 by FDA under an Emergency Use Authorization (EUA). This EUA will remain  in effect (meaning this test can be used) for the duration of the COVID-19 declaration under Section 564(b)(1) of the Act, 21 U.S.C.section 360bbb-3(b)(1), unless the authorization is terminated  or revoked sooner.       Influenza A by PCR NEGATIVE NEGATIVE  Final   Influenza B by PCR NEGATIVE NEGATIVE Final    Comment: (NOTE) The Xpert Xpress SARS-CoV-2/FLU/RSV plus assay is intended as an aid in the diagnosis of influenza from Nasopharyngeal swab specimens and should not be used as a sole basis for treatment. Nasal washings and aspirates are unacceptable for Xpert Xpress SARS-CoV-2/FLU/RSV testing.  Fact Sheet for Patients: BloggerCourse.com  Fact Sheet for Healthcare Providers: SeriousBroker.it  This test is not yet approved or cleared by the [place] FDA and has been authorized for detection and/or diagnosis of SARS-CoV-2 by FDA under an Emergency Use Authorization (EUA). This EUA will remain in effect (meaning this test can be used) for the duration of the COVID-19 declaration under Section 564(b)(1) of the Act, 21 U.S.C. section 360bbb-3(b)(1), unless the authorization is terminated or revoked.  Performed at [facility], [address] [place], [place] [address]   Blood Culture (routine x 2)     Status: None (Preliminary result)   Collection Time: 10/08/20  1:10 PM   Specimen: BLOOD  Result Value Ref Range Status   Specimen Description BLOOD RIGHT ANTECUBITAL  Final   Special Requests   Final    BOTTLES DRAWN AEROBIC AND ANAEROBIC Blood Culture adequate volume   Culture   Final    NO GROWTH 4 DAYS Performed at [facility], [address] [place], [place] [address]    Report Status PENDING  Incomplete  Blood Culture (routine x 2)     Status: None (Preliminary result)   Collection Time: 10/08/20  1:15 PM  Specimen: BLOOD LEFT HAND  Result Value Ref Range Status   Specimen Description BLOOD LEFT HAND  Final   Special Requests   Final    BOTTLES DRAWN AEROBIC AND ANAEROBIC Blood Culture adequate volume   Culture   Final    NO GROWTH 4 DAYS Performed at [facility], [address] [place], [place] [address]    Report Status PENDING  Incomplete  Urine culture      Status: None   Collection Time: 10/08/20  4:21 PM   Specimen: Urine, Clean Catch  Result Value Ref Range Status   Specimen Description   Final    URINE, CLEAN CATCH Performed at [facility], [address], [place], [place] [address]    Special Requests   Final    NONE Performed at [facility], [address], [place], [place] [address]    Culture   Final    NO GROWTH Performed at [facility], [address] [place], [place] [address]    Report Status 10/10/2020 FINAL  Final  Culture, blood (routine x 2) Call MD if unable to obtain prior to antibiotics being given     Status: None (Preliminary result)   Collection Time: 10/08/20  5:35 PM   Specimen: BLOOD LEFT HAND  Result Value Ref Range Status   Specimen Description BLOOD LEFT HAND  Final   Special Requests   Final    BOTTLES DRAWN AEROBIC AND ANAEROBIC Blood Culture adequate volume   Culture   Final    NO GROWTH 4 DAYS Performed at [facility], [address] [place], [place] [address]    Report Status PENDING  Incomplete  Culture, blood (routine x 2) Call MD if unable to obtain prior to antibiotics being given     Status: None (Preliminary result)   Collection Time: 10/08/20  5:40 PM   Specimen: BLOOD RIGHT HAND  Result Value Ref Range Status   Specimen Description BLOOD RIGHT HAND  Final   Special Requests   Final    BOTTLES DRAWN AEROBIC AND ANAEROBIC Blood Culture adequate volume   Culture   Final    NO GROWTH 4 DAYS Performed at [facility], [address], [place], [place] [address]    Report Status PENDING  Incomplete     Time coordinating discharge: 35 minutes  SIGNED:   Erick Blinks, DO [facility] 10/12/2020, 10:28 AM  If 7PM-7AM, please contact night-coverage [email or web address]

## 2020-10-13 LAB — CULTURE, BLOOD (ROUTINE X 2)
Culture: NO GROWTH
Culture: NO GROWTH
Culture: NO GROWTH
Culture: NO GROWTH
Special Requests: ADEQUATE
Special Requests: ADEQUATE
Special Requests: ADEQUATE
Special Requests: ADEQUATE

## 2020-10-14 ENCOUNTER — Ambulatory Visit: Payer: 59 | Admitting: Internal Medicine

## 2020-10-24 ENCOUNTER — Encounter (HOSPITAL_COMMUNITY): Payer: Self-pay | Admitting: *Deleted

## 2020-10-24 ENCOUNTER — Other Ambulatory Visit: Payer: Self-pay

## 2020-10-24 ENCOUNTER — Emergency Department (HOSPITAL_COMMUNITY): Payer: 59

## 2020-10-24 ENCOUNTER — Emergency Department (HOSPITAL_COMMUNITY)
Admission: EM | Admit: 2020-10-24 | Discharge: 2020-10-24 | Disposition: A | Discharge status: Home / Self Care | Payer: 59 | Attending: Emergency Medicine | Admitting: Emergency Medicine

## 2020-10-24 DIAGNOSIS — E039 Hypothyroidism, unspecified: Secondary | ICD-10-CM | POA: Insufficient documentation

## 2020-10-24 DIAGNOSIS — Z79899 Other long term (current) drug therapy: Secondary | ICD-10-CM | POA: Diagnosis not present

## 2020-10-24 DIAGNOSIS — R0989 Other specified symptoms and signs involving the circulatory and respiratory systems: Secondary | ICD-10-CM | POA: Diagnosis not present

## 2020-10-24 DIAGNOSIS — I110 Hypertensive heart disease with heart failure: Secondary | ICD-10-CM | POA: Insufficient documentation

## 2020-10-24 DIAGNOSIS — Z7982 Long term (current) use of aspirin: Secondary | ICD-10-CM | POA: Insufficient documentation

## 2020-10-24 DIAGNOSIS — K219 Gastro-esophageal reflux disease without esophagitis: Secondary | ICD-10-CM | POA: Insufficient documentation

## 2020-10-24 DIAGNOSIS — I509 Heart failure, unspecified: Secondary | ICD-10-CM | POA: Insufficient documentation

## 2020-10-24 LAB — CBC WITH DIFFERENTIAL/PLATELET
Abs Immature Granulocytes: 0.04 10*3/uL (ref 0.00–0.07)
Basophils Absolute: 0 10*3/uL (ref 0.0–0.1)
Basophils Relative: 0 %
Eosinophils Absolute: 0.2 10*3/uL (ref 0.0–0.5)
Eosinophils Relative: 2 %
HCT: 41 % (ref 39.0–52.0)
Hemoglobin: 13.2 g/dL (ref 13.0–17.0)
Immature Granulocytes: 1 %
Lymphocytes Relative: 17 %
Lymphs Abs: 1.4 10*3/uL (ref 0.7–4.0)
MCH: 28.9 pg (ref 26.0–34.0)
MCHC: 32.2 g/dL (ref 30.0–36.0)
MCV: 89.9 fL (ref 80.0–100.0)
Monocytes Absolute: 0.6 10*3/uL (ref 0.1–1.0)
Monocytes Relative: 7 %
Neutro Abs: 6.1 10*3/uL (ref 1.7–7.7)
Neutrophils Relative %: 73 %
Platelets: 247 10*3/uL (ref 150–400)
RBC: 4.56 MIL/uL (ref 4.22–5.81)
RDW: 13.2 % (ref 11.5–15.5)
WBC: 8.3 10*3/uL (ref 4.0–10.5)
nRBC: 0 % (ref 0.0–0.2)

## 2020-10-24 LAB — BASIC METABOLIC PANEL
Anion gap: 9 (ref 5–15)
BUN: 13 mg/dL (ref 6–20)
CO2: 30 mmol/L (ref 22–32)
Calcium: 9.1 mg/dL (ref 8.9–10.3)
Chloride: 100 mmol/L (ref 98–111)
Creatinine, Ser: 0.82 mg/dL (ref 0.61–1.24)
GFR, Estimated: >60 mL/min (ref >60)
Glucose, Bld: 118 mg/dL — ABNORMAL HIGH (ref 70–99)
Potassium: 4.5 mmol/L (ref 3.5–5.1)
Sodium: 139 mmol/L (ref 135–145)

## 2020-10-24 MED ORDER — AMOXICILLIN-POT CLAVULANATE 875-125 MG PO TABS: 1.0000 | ORAL_TABLET | Freq: Two times a day (BID) | ORAL | 0 refills | Status: AC

## 2020-10-24 NOTE — ED Notes (Signed)
Patient transported to X-ray 

## 2020-10-24 NOTE — ED Triage Notes (Signed)
Extensive history of aspiration pneumonia due to defect in his esophagus

## 2020-10-24 NOTE — ED Provider Notes (Signed)
[facility] Provider Note   CSN: [id number] Arrival date & time: 10/24/20  1752     History Chief Complaint  Patient presents with  . Gastroesophageal Reflux    Christian EnsignJohn G Ellison is a 60 y.o. male history of esophageal stricture, chronic aspiration, GERD, hepatitis, CHF most recent ejection fraction May 2019 shows left ventricular EF 55-60%  Patient presents today for concern of pneumonia, he reports that he gets pneumonia very frequently due to his esophageal stricture which causes him to aspirate.  Patient reports recently he was on a course of Augmentin for aspiration pneumonia which he finished around 2-3 days ago.  Patient reports that after eating the past 2 days he noticed some clear frothy sputum that he was spitting out, he reports that his wife listened to his lungs with a stethoscope and thought that she heard fluid around his lungs.  This prompted his ER visit today, he is very concerned that he may have recurrent aspiration pneumonia.  Patient denies fever/chills, headache, chest pain/shortness of breath, cough/hemoptysis, extremity swelling/color change, abdominal pain, nausea/vomiting, diarrhea or any additional concerns.  HPI     Past Medical History:  Diagnosis Date  . Anxiety   . Aspiration pneumonia (HCC) 05/26/2018   RLL  . Chronic pain   . Closed fracture of xiphoid process   . Esophageal stricture   . GERD (gastroesophageal reflux disease)   . 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   Christian Ellison. Opiate abuse, continuous (HCC)   . Pulmonary edema 02/02/2018  . Sepsis due to pneumonia (HCC) 03/11/2019  . Sleep apnea    CPAP nightly    Patient Active Problem List   Diagnosis Date Noted  . Pneumonia 10/09/2020  . Elevated lactic acid level   . GERD (gastroesophageal reflux disease)   . Esophageal motility disorder   . Alcohol abuse, in remission 08/30/2020  . Gastroesophageal reflux disease with esophagitis 08/30/2020  . Moderate  recurrent major depression (HCC) 08/30/2020  . Encounter to establish care 08/30/2020  . OSA on CPAP 08/30/2020  . Anxiety 06/12/2020  . Benzodiazepine dependence (HCC) 06/12/2020  . History of substance abuse (HCC) 06/12/2020  . Hypothyroidism 03/11/2019  . Recurrent aspiration pneumonia (HCC)   . Essential hypertension 02/02/2018  . CHF (congestive heart failure) (HCC) 02/02/2018  . Dysphagia 02/02/2018  . Esophageal stricture/stenosis/esophageal dysmotility with recurrent aspiration 02/02/2018  . Esophageal stenosis 02/02/2018  . Transaminitis   . Hx of hepatitis C 01/17/2013  . Depression 01/17/2013  . Anxiety with depression 01/17/2013    Past Surgical History:  Procedure Laterality Date  . BIOPSY  03/18/2018   Procedure: BIOPSY;  Surgeon: Christian Ellison, Christian U, MD;  Location: [facility];  Service: Endoscopy;;  gastric   . COLONOSCOPY N/A 11/22/2013   Dr. Karilyn Cotaehman: Normal except for hemorrhoids.  Next colonoscopy 10 years.  . ESOPHAGEAL DILATION N/A 03/18/2018   Procedure: ESOPHAGEAL DILATION;  Surgeon: Christian Ellison, Christian U, MD;  Location: [facility];  Service: Endoscopy;  Laterality: N/A;  . ESOPHAGEAL DILATION N/A 08/07/2020   Procedure: ESOPHAGEAL DILATION;  Surgeon: Christian Ellison, Christian U, MD;  Location: [facility];  Service: Endoscopy;  Laterality: N/A;  . ESOPHAGEAL MANOMETRY N/A 06/15/2018   Procedure: ESOPHAGEAL MANOMETRY (EM);  Surgeon: Christian Ellison, Christian V, MD;  Location: [facility];  Service: Endoscopy;  Laterality: N/A;  . ESOPHAGOGASTRODUODENOSCOPY (EGD) WITH PROPOFOL N/A 03/18/2018   Dr. Karilyn Cotaehman: Benign-appearing mild esophageal stenosis proximal to the GE J, status post dilation.  2  cm hiatal hernia.  Gastritis, biopsies negative for H. pylori  . ESOPHAGOGASTRODUODENOSCOPY (EGD) WITH PROPOFOL N/A 08/07/2020   Procedure: ESOPHAGOGASTRODUODENOSCOPY (EGD) WITH PROPOFOL;  Surgeon: Christian Hippo, MD;  Location: [facility];  Service: Endoscopy;  Laterality: N/A;  . Fracture  Right Leg     Patient has rod/screws in this leg  . ORIF CALCANEOUS FRACTURE Left 07/04/2020   Procedure: OPEN REDUCTION INTERNAL FIXATION (ORIF) LEFT TALUS AND CALCANEOUS FRACTURE, SUBTALAR JOINT ARTHROTOMY WITH REMOVAL OF LOOSE BODIES, TALONAVICULAR JOINT ARTHOTOMY WITH REMOVAL OF LOOSE BODIES;  Surgeon: Christian Hart, MD;  Location: [facility];  Service: Orthopedics;  Laterality: Left;  . Rotor Cuff  2012   Right Shoulder       Family History  Problem Relation Age of Onset  . Breast cancer Mother   . Kidney failure Mother   . Stroke Mother   . Colon cancer Father   . Bladder Cancer Father   . Prostate cancer Father   . Hypertension Sister   . Hypothyroidism Sister   . Healthy Sister   . Healthy Daughter   . Healthy Son   . Healthy Son     Social History   Tobacco Use  . Smoking status: Never Smoker  . Smokeless tobacco: Never Used  Vaping Use  . Vaping Use: Never used  Substance Use Topics  . Alcohol use: Not Currently  . Drug use: Not Currently    Types: Benzodiazepines    Comment: opiates    Home Medications Prior to Admission medications   Medication Sig Start Date End Date Taking? Authorizing Provider  acetaminophen (TYLENOL) 325 MG tablet Take 2 tablets (650 mg total) by mouth every 6 (six) hours as needed for mild pain (or Fever >/= 101). 05/17/19  Yes Emokpae, Courage, MD  albuterol (VENTOLIN HFA) 108 (90 Base) MCG/ACT inhaler Inhale 2 puffs into the lungs every 6 (six) hours as needed for wheezing or shortness of breath. 09/09/20  Yes Allena Katz, Christian Lou, MD  ALPRAZolam Prudy Feeler) 1 MG tablet Take 1 mg by mouth every 6 (six) hours as needed for anxiety.  10/22/19  Yes [provider]  amoxicillin-clavulanate (AUGMENTIN) 875-125 MG tablet Take 1 tablet by mouth every 12 (twelve) hours for 7 days. 10/24/20 10/31/20 Yes Christian Salinas, PA-C  aspirin EC 81 MG tablet Take 1 tablet (81 mg total) by mouth daily with breakfast. Patient taking differently: Take 81  mg by mouth at bedtime. 03/13/19  Yes Christian Hale, MD  Cimetidine (TAGAMET PO) Take 75 mg by mouth daily as needed (if he feels his food coming up after meal).   Yes [provider]  diltiazem (DILACOR XR) 120 MG 24 hr capsule Take 1 capsule (120 mg total) by mouth daily. 08/17/20  Yes Danford, Earl Lites, MD  Glucosamine HCl 1000 MG TABS Take 1,000 mg by mouth at bedtime.   Yes [provider]  levothyroxine (SYNTHROID) 25 MCG tablet Take 1 tablet (25 mcg total) by mouth daily. 09/10/20  Yes Anabel Halon, MD  Multiple Vitamin (MULTIVITAMIN WITH MINERALS) TABS tablet Take 1 tablet by mouth daily. 50+   Yes [provider]  pantoprazole (PROTONIX) 40 MG tablet Take 1 tablet (40 mg total) by mouth 2 (two) times daily. 08/07/20 08/07/21 Yes Christian Hale, MD    Allergies    Patient has no known allergies.  Review of Systems   Review of Systems Ten systems are reviewed and are negative for acute change except as noted in  the HPI  Physical Exam Updated Vital Signs BP (!) 148/85   Pulse 99   Temp 98.6 F (37 C)   Resp 19   SpO2 96%   Physical Exam Constitutional:      General: He is not in acute distress.    Appearance: Normal appearance. He is well-developed. He is not ill-appearing or diaphoretic.  HENT:     Head: Normocephalic and atraumatic.  Eyes:     General: Vision grossly intact. Gaze aligned appropriately.     Pupils: Pupils are equal, round, and reactive to light.  Neck:     Trachea: Trachea and phonation normal.  Cardiovascular:     Rate and Rhythm: Normal rate and regular rhythm.     Pulses: Normal pulses.  Pulmonary:     Effort: Pulmonary effort is normal. No accessory muscle usage or respiratory distress.     Breath sounds: Normal air entry.     Comments: Minimally bilaterally diminished breath sounds and rhonchi in the bases. Abdominal:     General: There is no distension.     Palpations: Abdomen is soft.     Tenderness: There  is no abdominal tenderness. There is no guarding or rebound.  Musculoskeletal:        General: Normal range of motion.     Cervical back: Normal range of motion.     Right lower leg: No edema.     Left lower leg: No edema.  Skin:    General: Skin is warm and dry.  Neurological:     Mental Status: He is alert.     GCS: GCS eye subscore is 4. GCS verbal subscore is 5. GCS motor subscore is 6.     Comments: Speech is clear and goal oriented, follows commands Major Cranial nerves without deficit, no facial droop Moves extremities without ataxia, coordination intact  Psychiatric:        Behavior: Behavior normal.     ED Results / Procedures / Treatments   Labs (all labs ordered are listed, but only abnormal results are displayed) Labs Reviewed  BASIC METABOLIC PANEL - Abnormal; Notable for the following components:      Result Value   Glucose, Bld 118 (*)    All other components within normal limits  CBC WITH DIFFERENTIAL/PLATELET    EKG None  Radiology DG Chest 2 View  Result Date: 10/24/2020 CLINICAL DATA:  Concern for aspiration. History of aspiration pneumonia due to esophageal defect. EXAM: CHEST - 2 VIEW COMPARISON:  Most recent radiograph 10/10/2020.  Chest CT 08/05/2020 FINDINGS: Improved patchy bilateral airspace disease from prior exam, mild residual. No new or progressive consolidation. The heart is normal in size with normal mediastinal contours. No pleural fluid or pneumothorax. Chronic anterior wedging of vertebra at the thoracolumbar junction. IMPRESSION: Improved bilateral patchy airspace disease from prior exam, mild residual. Electronically Signed   By: Narda Rutherford M.D.   On: 10/24/2020 19:44    Procedures Procedures   Medications Ordered in ED Medications - No data to display  ED Course  I have reviewed the triage vital signs and the nursing notes.  Pertinent labs & imaging results that were available during my care of the patient were reviewed by me  and considered in my medical decision making (see chart for details).    MDM Rules/Calculators/A&P                         Additional history obtained from: 1. Nursing  notes from this visit. 2. Review of electronic medical records.  Patient seen by gastroenterology at [facility] October 18, 2020, and appears they are planning a myotomy in the future but are obtaining barium swallow studies first.  Patient admitted to the hospital October 08, 2020, discharged 4 days later.  Diagnoses include hypothyroidism, esophageal stricture, recurrent aspiration pneumonia, benzodiazepine dependence, OSA, pneumonia and elevated lactic acid level.  It appears at that time patient had presented to the ER with altered mental status and fever 3 days after a choking event at home.  He had right lower lobe pneumonia consistent with aspiration.  He was treated with IV Zosyn, he was requiring oxygen at that time.  He was discharged on Augmentin. ------------------------------- CBC within normal limits, no leukocytosis to suggest infectious process, no anemia. BMP shows no emergent electrolyte derangement, AKI or gap.  CXR:  IMPRESSION:  Improved bilateral patchy airspace disease from prior exam, mild  residual.  -------- Patient reassessed he is resting comfortably in bed no acute distress vital signs within normal limits no hypoxia or tachycardia on room air.  Patient is very concerned that he may develop pneumonia due to aspiration again.  Feel it is reasonable given exam today to start patient on course of Augmentin and have him follow-up closely with his primary care provider and gastroenterologist.  There is no indication for admission or further work-up at this time.  At this time there does not appear to be any evidence of an acute emergency medical condition and the patient appears stable for discharge with appropriate outpatient follow up. Diagnosis was discussed with patient who verbalizes understanding of care plan  and is agreeable to discharge. I have discussed return precautions with patient and wife who verbalizes understanding. Patient encouraged to follow-up with their PCP and gastroenterology. All questions answered.  Patient's case discussed with Dr. Agapito Games who agrees with plan to discharge with follow-up.   Note: Portions of this report may have been transcribed using voice recognition software. Every effort was made to ensure accuracy; however, inadvertent computerized transcription errors may still be present. Final Clinical Impression(s) / ED Diagnoses Final diagnoses:  Rhonchi    Rx / DC Orders ED Discharge Orders         Ordered    amoxicillin-clavulanate (AUGMENTIN) 875-125 MG tablet  Every 12 hours        10/24/20 2141           Elizabeth Palau 10/24/20 2142    Bethann Berkshire, MD 10/27/20 1128

## 2020-10-24 NOTE — Discharge Instructions (Addendum)
At this time there does not appear to be the presence of an emergent medical condition, however there is always the potential for conditions to change. Please read and follow the below instructions.  Please return to the Emergency Department immediately for any new or worsening symptoms. Please take your antibiotic Augmentin as prescribed until complete to help with your symptoms.  Please drink enough water to avoid dehydration and get plenty of rest. Please follow-up with your gastroenterologist and your primary care provider this week for recheck.  Go to the nearest Emergency Department immediately if: You have fever or chills Are coughing or choking while eating or drinking. Continue to have signs or symptoms of aspiration pneumonia. You have chest pain or shortness of breath. You have any new/concerning or worsening symptoms.   Please read the additional information packets attached to your discharge summary.  Do not take your medicine if  develop an itchy rash, swelling in your mouth or lips, or difficulty breathing; call 911 and seek immediate emergency medical attention if this occurs.  You may review your lab tests and imaging results in their entirety on your MyChart account.  Please discuss all results of fully with your primary care provider and other specialist at your follow-up visit.  Note: Portions of this text may have been transcribed using voice recognition software. Every effort was made to ensure accuracy; however, inadvertent computerized transcription errors may still be present.

## 2020-10-25 ENCOUNTER — Encounter (INDEPENDENT_AMBULATORY_CARE_PROVIDER_SITE_OTHER): Payer: Self-pay

## 2020-10-28 ENCOUNTER — Telehealth (INDEPENDENT_AMBULATORY_CARE_PROVIDER_SITE_OTHER): Payer: Self-pay | Admitting: *Deleted

## 2020-10-28 NOTE — Telephone Encounter (Signed)
Hello Dr Karilyn Cota, this is Christian Ellison Chisom's wife. I have a question about one of the test results, what does it mean when the results say airway with patchy spots of disease? Also his spinal structure, is it putting pressure on his esophagus? We went to [facility] and making apt for  Barium swallow test, then Botox and 3-6 months hopefully surgery to relax esophagus and build flap up!!!! Very excited!! Thank you for being such a wonderful Doctor, you have saved my precious husband from a early demise!!! Always Grateful for your time, help ,compassion  and medical treatments for my husband!! Thank you again, forever appreciated, Mr&Mrs Jobe Marker Latulippe.., call if you would like to review results from last night. Or have any questions or instructions! Thank you again! [name] Shook [phone]

## 2020-11-05 ENCOUNTER — Other Ambulatory Visit: Payer: Self-pay

## 2020-11-05 ENCOUNTER — Inpatient Hospital Stay (HOSPITAL_COMMUNITY): Payer: 59

## 2020-11-05 ENCOUNTER — Emergency Department (HOSPITAL_COMMUNITY): Payer: 59

## 2020-11-05 ENCOUNTER — Inpatient Hospital Stay (HOSPITAL_COMMUNITY)
Admission: EM | Admit: 2020-11-05 | Discharge: 2020-11-08 | DRG: 871 | Disposition: A | Discharge status: Home / Self Care | Payer: 59 | Attending: Internal Medicine | Admitting: Internal Medicine

## 2020-11-05 DIAGNOSIS — K219 Gastro-esophageal reflux disease without esophagitis: Secondary | ICD-10-CM | POA: Diagnosis present

## 2020-11-05 DIAGNOSIS — Z823 Family history of stroke: Secondary | ICD-10-CM | POA: Diagnosis not present

## 2020-11-05 DIAGNOSIS — R1310 Dysphagia, unspecified: Secondary | ICD-10-CM

## 2020-11-05 DIAGNOSIS — K224 Dyskinesia of esophagus: Secondary | ICD-10-CM | POA: Diagnosis not present

## 2020-11-05 DIAGNOSIS — E039 Hypothyroidism, unspecified: Secondary | ICD-10-CM | POA: Diagnosis present

## 2020-11-05 DIAGNOSIS — R1319 Other dysphagia: Secondary | ICD-10-CM

## 2020-11-05 DIAGNOSIS — J439 Emphysema, unspecified: Secondary | ICD-10-CM | POA: Diagnosis present

## 2020-11-05 DIAGNOSIS — Z8042 Family history of malignant neoplasm of prostate: Secondary | ICD-10-CM

## 2020-11-05 DIAGNOSIS — F1110 Opioid abuse, uncomplicated: Secondary | ICD-10-CM | POA: Diagnosis present

## 2020-11-05 DIAGNOSIS — F419 Anxiety disorder, unspecified: Secondary | ICD-10-CM | POA: Diagnosis present

## 2020-11-05 DIAGNOSIS — R7989 Other specified abnormal findings of blood chemistry: Secondary | ICD-10-CM | POA: Diagnosis present

## 2020-11-05 DIAGNOSIS — J189 Pneumonia, unspecified organism: Secondary | ICD-10-CM | POA: Diagnosis present

## 2020-11-05 DIAGNOSIS — K222 Esophageal obstruction: Secondary | ICD-10-CM | POA: Diagnosis present

## 2020-11-05 DIAGNOSIS — Z79890 Hormone replacement therapy (postmenopausal): Secondary | ICD-10-CM

## 2020-11-05 DIAGNOSIS — J690 Pneumonitis due to inhalation of food and vomit: Secondary | ICD-10-CM

## 2020-11-05 DIAGNOSIS — Z8249 Family history of ischemic heart disease and other diseases of the circulatory system: Secondary | ICD-10-CM | POA: Diagnosis not present

## 2020-11-05 DIAGNOSIS — A419 Sepsis, unspecified organism: Secondary | ICD-10-CM | POA: Diagnosis not present

## 2020-11-05 DIAGNOSIS — Z8052 Family history of malignant neoplasm of bladder: Secondary | ICD-10-CM | POA: Diagnosis not present

## 2020-11-05 DIAGNOSIS — Z841 Family history of disorders of kidney and ureter: Secondary | ICD-10-CM

## 2020-11-05 DIAGNOSIS — K2100 Gastro-esophageal reflux disease with esophagitis, without bleeding: Secondary | ICD-10-CM | POA: Diagnosis present

## 2020-11-05 DIAGNOSIS — G8929 Other chronic pain: Secondary | ICD-10-CM | POA: Diagnosis present

## 2020-11-05 DIAGNOSIS — B1920 Unspecified viral hepatitis C without hepatic coma: Secondary | ICD-10-CM | POA: Diagnosis present

## 2020-11-05 DIAGNOSIS — Z800 Family history of malignant neoplasm of digestive organs: Secondary | ICD-10-CM | POA: Diagnosis not present

## 2020-11-05 DIAGNOSIS — Z20822 Contact with and (suspected) exposure to covid-19: Secondary | ICD-10-CM | POA: Diagnosis present

## 2020-11-05 DIAGNOSIS — F418 Other specified anxiety disorders: Secondary | ICD-10-CM | POA: Diagnosis present

## 2020-11-05 DIAGNOSIS — R000 Tachycardia, unspecified: Secondary | ICD-10-CM | POA: Diagnosis present

## 2020-11-05 DIAGNOSIS — R6520 Severe sepsis without septic shock: Secondary | ICD-10-CM | POA: Diagnosis present

## 2020-11-05 DIAGNOSIS — R1314 Dysphagia, pharyngoesophageal phase: Secondary | ICD-10-CM | POA: Diagnosis present

## 2020-11-05 DIAGNOSIS — I509 Heart failure, unspecified: Secondary | ICD-10-CM

## 2020-11-05 DIAGNOSIS — Z7982 Long term (current) use of aspirin: Secondary | ICD-10-CM

## 2020-11-05 DIAGNOSIS — K2289 Other specified disease of esophagus: Secondary | ICD-10-CM

## 2020-11-05 DIAGNOSIS — Z803 Family history of malignant neoplasm of breast: Secondary | ICD-10-CM

## 2020-11-05 DIAGNOSIS — R0902 Hypoxemia: Secondary | ICD-10-CM | POA: Diagnosis present

## 2020-11-05 DIAGNOSIS — F32A Depression, unspecified: Secondary | ICD-10-CM | POA: Diagnosis present

## 2020-11-05 DIAGNOSIS — I10 Essential (primary) hypertension: Secondary | ICD-10-CM | POA: Diagnosis present

## 2020-11-05 DIAGNOSIS — Z79899 Other long term (current) drug therapy: Secondary | ICD-10-CM

## 2020-11-05 DIAGNOSIS — G4733 Obstructive sleep apnea (adult) (pediatric): Secondary | ICD-10-CM | POA: Diagnosis present

## 2020-11-05 DIAGNOSIS — J9601 Acute respiratory failure with hypoxia: Secondary | ICD-10-CM

## 2020-11-05 LAB — COMPREHENSIVE METABOLIC PANEL
ALT: 17 U/L (ref 0–44)
AST: 19 U/L (ref 15–41)
Albumin: 4.3 g/dL (ref 3.5–5.0)
Alkaline Phosphatase: 71 U/L (ref 38–126)
Anion gap: 9 (ref 5–15)
BUN: 8 mg/dL (ref 6–20)
CO2: 29 mmol/L (ref 22–32)
Calcium: 8.7 mg/dL — ABNORMAL LOW (ref 8.9–10.3)
Chloride: 102 mmol/L (ref 98–111)
Creatinine, Ser: 0.84 mg/dL (ref 0.61–1.24)
GFR, Estimated: >60 mL/min (ref >60)
Glucose, Bld: 107 mg/dL — ABNORMAL HIGH (ref 70–99)
Potassium: 3.5 mmol/L (ref 3.5–5.1)
Sodium: 140 mmol/L (ref 135–145)
Total Bilirubin: 0.3 mg/dL (ref 0.3–1.2)
Total Protein: 7.8 g/dL (ref 6.5–8.1)

## 2020-11-05 LAB — LACTIC ACID, PLASMA
Lactic Acid, Venous: 2.8 mmol/L (ref 0.5–1.9)
Lactic Acid, Venous: 2.5 mmol/L (ref 0.5–1.9)
Lactic Acid, Venous: 1.8 mmol/L (ref 0.5–1.9)
Lactic Acid, Venous: 1.3 mmol/L (ref 0.5–1.9)

## 2020-11-05 LAB — URINALYSIS, ROUTINE W REFLEX MICROSCOPIC
Bacteria, UA: NONE SEEN
Bilirubin Urine: NEGATIVE
Glucose, UA: NEGATIVE mg/dL
Ketones, ur: NEGATIVE mg/dL
Leukocytes,Ua: NEGATIVE
Nitrite: NEGATIVE
Protein, ur: NEGATIVE mg/dL
Specific Gravity, Urine: 1.012 (ref 1.005–1.030)
pH: 5 (ref 5.0–8.0)

## 2020-11-05 LAB — CBC WITH DIFFERENTIAL/PLATELET
Abs Immature Granulocytes: 0.02 10*3/uL (ref 0.00–0.07)
Basophils Absolute: 0 10*3/uL (ref 0.0–0.1)
Basophils Relative: 0 %
Eosinophils Absolute: 0.3 10*3/uL (ref 0.0–0.5)
Eosinophils Relative: 3 %
HCT: 43.1 % (ref 39.0–52.0)
Hemoglobin: 13.8 g/dL (ref 13.0–17.0)
Immature Granulocytes: 0 %
Lymphocytes Relative: 25 %
Lymphs Abs: 2.1 10*3/uL (ref 0.7–4.0)
MCH: 29.2 pg (ref 26.0–34.0)
MCHC: 32 g/dL (ref 30.0–36.0)
MCV: 91.3 fL (ref 80.0–100.0)
Monocytes Absolute: 0.4 10*3/uL (ref 0.1–1.0)
Monocytes Relative: 5 %
Neutro Abs: 5.6 10*3/uL (ref 1.7–7.7)
Neutrophils Relative %: 67 %
Platelets: 173 10*3/uL (ref 150–400)
RBC: 4.72 MIL/uL (ref 4.22–5.81)
RDW: 13.7 % (ref 11.5–15.5)
WBC: 8.3 10*3/uL (ref 4.0–10.5)
nRBC: 0 % (ref 0.0–0.2)

## 2020-11-05 LAB — APTT: aPTT: 27 seconds (ref 24–36)

## 2020-11-05 LAB — RESP PANEL BY RT-PCR (FLU A&B, COVID) ARPGX2
Influenza A by PCR: NEGATIVE
Influenza B by PCR: NEGATIVE
SARS Coronavirus 2 by RT PCR: NEGATIVE

## 2020-11-05 LAB — MRSA PCR SCREENING: MRSA by PCR: NEGATIVE

## 2020-11-05 LAB — PROTIME-INR
INR: 0.9 (ref 0.8–1.2)
Prothrombin Time: 11.8 seconds (ref 11.4–15.2)

## 2020-11-05 LAB — CBG MONITORING, ED: Glucose-Capillary: 115 mg/dL — ABNORMAL HIGH (ref 70–99)

## 2020-11-05 LAB — BRAIN NATRIURETIC PEPTIDE: B Natriuretic Peptide: 35 pg/mL (ref 0.0–100.0)

## 2020-11-05 MED ORDER — MAGNESIUM CITRATE PO SOLN: 1.0000 | Freq: Once | ORAL | Status: DC | PRN

## 2020-11-05 MED ORDER — PIPERACILLIN-TAZOBACTAM 3.375 G IVPB
3.3750 g | Freq: Three times a day (TID) | INTRAVENOUS | Status: DC
  Administered 2020-11-05 – 2020-11-07 (×8): 3.375 g via INTRAVENOUS
  Filled 2020-11-05 (×8): qty 50

## 2020-11-05 MED ORDER — IOHEXOL 350 MG/ML SOLN
100.0000 mL | Freq: Once | INTRAVENOUS | Status: AC | PRN
  Administered 2020-11-05: 100 mL via INTRAVENOUS

## 2020-11-05 MED ORDER — ACETAMINOPHEN 650 MG RE SUPP: 650.0000 mg | Freq: Four times a day (QID) | RECTAL | Status: DC | PRN

## 2020-11-05 MED ORDER — ALBUTEROL SULFATE HFA 108 (90 BASE) MCG/ACT IN AERS: 2.0000 | INHALATION_SPRAY | RESPIRATORY_TRACT | Status: DC | PRN

## 2020-11-05 MED ORDER — FAMOTIDINE 20 MG PO TABS
40.0000 mg | ORAL_TABLET | Freq: Every day | ORAL | Status: DC
  Administered 2020-11-05 – 2020-11-08 (×4): 40 mg via ORAL
  Filled 2020-11-05 (×4): qty 2

## 2020-11-05 MED ORDER — PIPERACILLIN-TAZOBACTAM 4.5 G IVPB: 4.5000 g | Freq: Once | INTRAVENOUS | Status: DC

## 2020-11-05 MED ORDER — LEVOTHYROXINE SODIUM 25 MCG PO TABS
25.0000 ug | ORAL_TABLET | Freq: Every day | ORAL | Status: DC
  Administered 2020-11-06 – 2020-11-08 (×3): 25 ug via ORAL
  Filled 2020-11-05 (×3): qty 1

## 2020-11-05 MED ORDER — HYDROMORPHONE HCL 1 MG/ML IJ SOLN
0.5000 mg | INTRAMUSCULAR | Status: DC | PRN
Start: 2020-11-05 — End: 2020-11-08

## 2020-11-05 MED ORDER — ASPIRIN EC 81 MG PO TBEC
81.0000 mg | DELAYED_RELEASE_TABLET | Freq: Every day | ORAL | Status: DC
  Administered 2020-11-05 – 2020-11-07 (×3): 81 mg via ORAL
  Filled 2020-11-05 (×3): qty 1

## 2020-11-05 MED ORDER — SODIUM CHLORIDE 0.9% FLUSH
3.0000 mL | Freq: Two times a day (BID) | INTRAVENOUS | Status: DC
  Administered 2020-11-05 – 2020-11-07 (×3): 3 mL via INTRAVENOUS

## 2020-11-05 MED ORDER — LEVALBUTEROL HCL 0.63 MG/3ML IN NEBU: 0.6300 mg | INHALATION_SOLUTION | Freq: Four times a day (QID) | RESPIRATORY_TRACT | Status: DC | PRN

## 2020-11-05 MED ORDER — ORAL CARE MOUTH RINSE
15.0000 mL | Freq: Two times a day (BID) | OROMUCOSAL | Status: DC
  Administered 2020-11-07: 15 mL via OROMUCOSAL

## 2020-11-05 MED ORDER — LACTATED RINGERS IV BOLUS (SEPSIS): 1000.0000 mL | Freq: Once | INTRAVENOUS | Status: DC

## 2020-11-05 MED ORDER — SODIUM CHLORIDE 0.9% FLUSH: 3.0000 mL | INTRAVENOUS | Status: DC | PRN

## 2020-11-05 MED ORDER — LACTATED RINGERS IV BOLUS (SEPSIS)
1000.0000 mL | Freq: Once | INTRAVENOUS | Status: AC
  Administered 2020-11-05: 1000 mL via INTRAVENOUS

## 2020-11-05 MED ORDER — VANCOMYCIN HCL 1500 MG/300ML IV SOLN
1500.0000 mg | Freq: Two times a day (BID) | INTRAVENOUS | Status: DC
  Administered 2020-11-05 (×2): 1500 mg via INTRAVENOUS
  Filled 2020-11-05 (×3): qty 300

## 2020-11-05 MED ORDER — ALPRAZOLAM 1 MG PO TABS
1.0000 mg | ORAL_TABLET | Freq: Four times a day (QID) | ORAL | Status: DC | PRN
  Administered 2020-11-05 – 2020-11-07 (×4): 1 mg via ORAL
  Filled 2020-11-05 (×4): qty 1

## 2020-11-05 MED ORDER — VANCOMYCIN HCL IN DEXTROSE 1-5 GM/200ML-% IV SOLN: 1000.0000 mg | Freq: Three times a day (TID) | INTRAVENOUS | Status: DC

## 2020-11-05 MED ORDER — SODIUM CHLORIDE 0.9 % IV SOLN: 250.0000 mL | INTRAVENOUS | Status: DC | PRN

## 2020-11-05 MED ORDER — LACTATED RINGERS IV BOLUS
1000.0000 mL | Freq: Once | INTRAVENOUS | Status: AC
  Administered 2020-11-05: 1000 mL via INTRAVENOUS

## 2020-11-05 MED ORDER — OXYCODONE HCL 5 MG PO TABS: 5.0000 mg | ORAL_TABLET | ORAL | Status: DC | PRN

## 2020-11-05 MED ORDER — ACETAMINOPHEN 325 MG PO TABS
650.0000 mg | ORAL_TABLET | Freq: Four times a day (QID) | ORAL | Status: DC | PRN
  Administered 2020-11-07: 650 mg via ORAL
  Filled 2020-11-05 (×2): qty 2

## 2020-11-05 MED ORDER — VANCOMYCIN HCL IN DEXTROSE 1-5 GM/200ML-% IV SOLN: 1000.0000 mg | Freq: Once | INTRAVENOUS | Status: DC

## 2020-11-05 MED ORDER — SENNOSIDES-DOCUSATE SODIUM 8.6-50 MG PO TABS: 1.0000 | ORAL_TABLET | Freq: Every evening | ORAL | Status: DC | PRN

## 2020-11-05 MED ORDER — ONDANSETRON HCL 4 MG/2ML IJ SOLN: 4.0000 mg | Freq: Four times a day (QID) | INTRAMUSCULAR | Status: DC | PRN

## 2020-11-05 MED ORDER — BISACODYL 5 MG PO TBEC: 5.0000 mg | DELAYED_RELEASE_TABLET | Freq: Every day | ORAL | Status: DC | PRN

## 2020-11-05 MED ORDER — PIPERACILLIN-TAZOBACTAM 3.375 G IVPB 30 MIN
3.3750 g | INTRAVENOUS | Status: AC
  Administered 2020-11-05: 3.375 g via INTRAVENOUS
  Filled 2020-11-05 (×3): qty 50

## 2020-11-05 MED ORDER — LACTATED RINGERS IV SOLN: INTRAVENOUS | Status: AC

## 2020-11-05 MED ORDER — CHLORHEXIDINE GLUCONATE 0.12 % MT SOLN
15.0000 mL | Freq: Two times a day (BID) | OROMUCOSAL | Status: DC
  Administered 2020-11-05 – 2020-11-07 (×4): 15 mL via OROMUCOSAL
  Filled 2020-11-05 (×5): qty 15

## 2020-11-05 MED ORDER — IPRATROPIUM BROMIDE 0.02 % IN SOLN: 0.5000 mg | Freq: Four times a day (QID) | RESPIRATORY_TRACT | Status: DC | PRN

## 2020-11-05 MED ORDER — SODIUM CHLORIDE 0.9 % IV SOLN: 2.0000 g | INTRAVENOUS | Status: DC

## 2020-11-05 MED ORDER — ORAL CARE MOUTH RINSE
15.0000 mL | Freq: Two times a day (BID) | OROMUCOSAL | Status: DC
  Administered 2020-11-06: 15 mL via OROMUCOSAL

## 2020-11-05 MED ORDER — VANCOMYCIN HCL 1500 MG/300ML IV SOLN: 1500.0000 mg | Freq: Once | INTRAVENOUS | Status: DC

## 2020-11-05 MED ORDER — DILTIAZEM HCL ER 120 MG PO CP24
120.0000 mg | ORAL_CAPSULE | Freq: Every day | ORAL | Status: DC
  Administered 2020-11-05 – 2020-11-08 (×4): 120 mg via ORAL
  Filled 2020-11-05 (×11): qty 1

## 2020-11-05 MED ORDER — ONDANSETRON HCL 4 MG PO TABS: 4.0000 mg | ORAL_TABLET | Freq: Four times a day (QID) | ORAL | Status: DC | PRN

## 2020-11-05 MED ORDER — HEPARIN SODIUM (PORCINE) 5000 UNIT/ML IJ SOLN
5000.0000 [IU] | Freq: Three times a day (TID) | INTRAMUSCULAR | Status: DC
  Administered 2020-11-05 – 2020-11-08 (×9): 5000 [IU] via SUBCUTANEOUS
  Filled 2020-11-05 (×10): qty 1

## 2020-11-05 MED ORDER — HYDRALAZINE HCL 20 MG/ML IJ SOLN: 10.0000 mg | INTRAMUSCULAR | Status: DC | PRN

## 2020-11-05 MED ORDER — SODIUM CHLORIDE 0.9 % IV SOLN
500.0000 mg | INTRAVENOUS | Status: DC
  Administered 2020-11-05: 500 mg via INTRAVENOUS
  Filled 2020-11-05: qty 500

## 2020-11-05 NOTE — Progress Notes (Signed)
   11/05/20 1000  Assess: MEWS Score  Temp 99.1 F (37.3 C)  BP 94/64  Pulse Rate 83  Resp (!) 22  Level of Consciousness Alert  SpO2 97 %  Assess: MEWS Score  MEWS Temp 0  MEWS Systolic 1  MEWS Pulse 0  MEWS RR 1  MEWS LOC 0  MEWS Score 2  MEWS Score Color Yellow  Assess: if the MEWS score is Yellow or Red  Were vital signs taken at a resting state? Yes  Focused Assessment No change from prior assessment  Early Detection of Sepsis Score *See Row Information* Medium  MEWS guidelines implemented *See Row Information* Yes  Treat  MEWS Interventions Escalated (See documentation below)  Pain Scale 0-10  Pain Score Asleep  Take Vital Signs  Increase Vital Sign Frequency  Yellow: Q 2hr X 2 then Q 4hr X 2, if remains yellow, continue Q 4hrs  Escalate  MEWS: Escalate Yellow: discuss with charge nurse/RN and consider discussing with provider and RRT  Notify: Charge Nurse/RN  Name of Charge Nurse/RN Notified Westley Foots, RN  Date Charge Nurse/RN Notified 11/05/20  Time Charge Nurse/RN Notified 1050  Notify: Provider  Provider Name/Title Shahmehdi  Date Provider Notified 11/05/20  Time Provider Notified 1050  Notification Type Face-to-face  Notification Reason Change in status  Provider response No new orders  Date of Provider Response 11/05/20  Time of Provider Response 1015  Document  Patient Outcome Other (Comment) (Stable, remains on department)  Progress note created (see row info) Yes

## 2020-11-05 NOTE — Progress Notes (Signed)
Pharmacy Antibiotic Note  Christian Ellison is a 60 y.o. male admitted on 11/05/2020 with aspiration pna.  Pharmacy has been consulted for Zosyn and Vancomycin dosing.  Plan: Vancomycin 1500mg   IV q12h for ~ AUC 478 Zosyn 3.375gm IV q8h Will f/u renal function, micro data, and pt's clinical condition  Height: 6' (182.9 cm) Weight: 90.7 kg (200 lb) IBW/kg (Calculated) : 77.6  Temp (24hrs), Avg:98.7 F (37.1 C), Min:98.7 F (37.1 C), Max:98.7 F (37.1 C)  Recent Labs  Lab 11/05/20 0311 11/05/20 0502  WBC 8.3  --   CREATININE 0.84  --   LATICACIDVEN 2.8* 2.5*    Estimated Creatinine Clearance: 105.2 mL/min (by C-G formula based on SCr of 0.84 mg/dL).    No Known Allergies  Antimicrobials this admission: 3/22 Zosyn >>  3/22 Vancomycin >>  Microbiology results: 3/22 BCx: pending  3/22 UCx: pending  Thank you for allowing pharmacy to be a part of this patient's care.  4/22, BS Pharm D, [facility] Clinical Pharmacist Pager [phone] 11/05/2020 7:57 AM

## 2020-11-05 NOTE — Progress Notes (Signed)
Elink following for Sepsis Protocol 

## 2020-11-05 NOTE — Progress Notes (Signed)
PT Cancellation Note  Patient Details Name: Christian Ellison MRN: [id number] DOB: 1960-09-04   Cancelled Treatment:    Reason Eval/Treat Not Completed: Patient declined, no reason specified. Therapist entered room and introduced self as well as educated on purposed of PT eval while pt eating apple sauce and ice cream. Pt politely declines pt eval, states he doesn't have weakness and was able to complete 4 acres of yard work yesterday. Will sign off at this time, please re-consult if acute needs arise.    Domenick Bookbinder PT, DPT 11/05/20, 2:43 PM

## 2020-11-05 NOTE — H&P (Signed)
History and Physical   Patient: Christian Ellison                            PCP: Anabel Halon, MD                    DOB: 1961/04/29            DOA: 11/05/2020 MEQ:[id number]             DOS: 11/05/2020, 8:17 AM  Patient coming from:   HOME  I have personally reviewed patient's medical records, in electronic medical records, including:  [facility], and care everywhere.    Chief Complaint:   Chief Complaint  Patient presents with  . Aspiration    History of present illness:    Christian Ellison is a 60 y.o. male with medical history significant of dysphagia, esophageal dysmotility disorder, GERD, chronic pain, esophageal stricture, hepatitis C, HTN,?  CHF, OSA, anxiety/depression, hypothyroidism.... Presented with shortness of breath, hypoxia.  Patient reports that he has a dysphagia, esophageal dysmotility, anatomical issue which recently been looked up at [facility].  But the result has not been discussed with the him.  Reporting since yesterday he has difficulty breathing, his wife checked his pulse ox was as low as 68, 81 in triage in ED, on 6 L improved to 96%... He is not O2 dependent at home.  Patient Denies having: Fever, Chills, Chest Pain, Abd pain, N/V/D, headache, dizziness, lightheadedness,  Dysuria, Joint pain, rash, open wounds  ED Course:   Vitals: Likely stable, temp 98.7, PR 107, RR 25, BP 124/71, satting 94% on 6 L of oxygen Abnormal labs; CBC CMP within normal limits, lactic acid 2.8, 2.5, WBC 8.3, CT Angiogram-negative for PE, positive for emphysema, multifocal pneumonia consistent with possible aspiration Chest x-ray clear  Patient will be admitted-as he meets sepsis criteria due to possible aspiration pneumonia  Review of Systems: As per HPI, otherwise 10 point review of systems were negative.   ----------------------------------------------------------------------------------------------------------------------  No Known Allergies  Home MEDs:  Prior  to Admission medications   Medication Sig Start Date End Date Taking? Authorizing Provider  acetaminophen (TYLENOL) 325 MG tablet Take 2 tablets (650 mg total) by mouth every 6 (six) hours as needed for mild pain (or Fever >/= 101). 05/17/19  Yes Emokpae, Courage, MD  albuterol (VENTOLIN HFA) 108 (90 Base) MCG/ACT inhaler Inhale 2 puffs into the lungs every 6 (six) hours as needed for wheezing or shortness of breath. 09/09/20  Yes Allena Katz, Earlie Lou, MD  ALPRAZolam Prudy Feeler) 1 MG tablet Take 1 mg by mouth every 6 (six) hours as needed for anxiety.  10/22/19  Yes [provider]  aspirin EC 81 MG tablet Take 1 tablet (81 mg total) by mouth daily with breakfast. Patient taking differently: Take 81 mg by mouth at bedtime. 03/13/19  Yes Shon Hale, MD  Cimetidine (TAGAMET PO) Take 75 mg by mouth daily as needed (if he feels his food coming up after meal).   Yes [provider]  diltiazem (DILACOR XR) 120 MG 24 hr capsule Take 1 capsule (120 mg total) by mouth daily. 08/17/20  Yes Danford, Earl Lites, MD  Glucosamine HCl 1000 MG TABS Take 1,000 mg by mouth at bedtime.   Yes [provider]  levothyroxine (SYNTHROID) 25 MCG tablet Take 1 tablet (25 mcg total) by mouth daily. 09/10/20  Yes Anabel Halon, MD  Multiple Vitamin (MULTIVITAMIN  WITH MINERALS) TABS tablet Take 1 tablet by mouth daily. 50+   Yes [provider]  pantoprazole (PROTONIX) 40 MG tablet Take 1 tablet (40 mg total) by mouth 2 (two) times daily. 08/07/20 08/07/21 Yes Shon Hale, MD    PRN MEDs: sodium chloride, acetaminophen **OR** acetaminophen, albuterol, ALPRAZolam, bisacodyl, hydrALAZINE, HYDROmorphone (DILAUDID) injection, ipratropium, levalbuterol, magnesium citrate, ondansetron **OR** ondansetron (ZOFRAN) IV, oxyCODONE, senna-docusate, sodium chloride flush  Past Medical History:  Diagnosis Date  . Anxiety   . Aspiration pneumonia (HCC) 05/26/2018   RLL  . Chronic pain   . Closed  fracture of xiphoid process   . Esophageal stricture   . GERD (gastroesophageal reflux disease)   . 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   Marland Kitchen Opiate abuse, continuous (HCC)   . Pulmonary edema 02/02/2018  . Sepsis due to pneumonia (HCC) 03/11/2019  . Sleep apnea    CPAP nightly    Past Surgical History:  Procedure Laterality Date  . BIOPSY  03/18/2018   Procedure: BIOPSY;  Surgeon: Malissa Hippo, MD;  Location: [facility];  Service: Endoscopy;;  gastric   . COLONOSCOPY N/A 11/22/2013   Dr. Karilyn Cota: Normal except for hemorrhoids.  Next colonoscopy 10 years.  . ESOPHAGEAL DILATION N/A 03/18/2018   Procedure: ESOPHAGEAL DILATION;  Surgeon: Malissa Hippo, MD;  Location: [facility];  Service: Endoscopy;  Laterality: N/A;  . ESOPHAGEAL DILATION N/A 08/07/2020   Procedure: ESOPHAGEAL DILATION;  Surgeon: Malissa Hippo, MD;  Location: [facility];  Service: Endoscopy;  Laterality: N/A;  . ESOPHAGEAL MANOMETRY N/A 06/15/2018   Procedure: ESOPHAGEAL MANOMETRY (EM);  Surgeon: Napoleon Form, MD;  Location: [facility];  Service: Endoscopy;  Laterality: N/A;  . ESOPHAGOGASTRODUODENOSCOPY (EGD) WITH PROPOFOL N/A 03/18/2018   Dr. Karilyn Cota: Benign-appearing mild esophageal stenosis proximal to the GE J, status post dilation.  2 cm hiatal hernia.  Gastritis, biopsies negative for H. pylori  . ESOPHAGOGASTRODUODENOSCOPY (EGD) WITH PROPOFOL N/A 08/07/2020   Procedure: ESOPHAGOGASTRODUODENOSCOPY (EGD) WITH PROPOFOL;  Surgeon: Malissa Hippo, MD;  Location: [facility];  Service: Endoscopy;  Laterality: N/A;  . Fracture Right Leg     Patient has rod/screws in this leg  . ORIF CALCANEOUS FRACTURE Left 07/04/2020   Procedure: OPEN REDUCTION INTERNAL FIXATION (ORIF) LEFT TALUS AND CALCANEOUS FRACTURE, SUBTALAR JOINT ARTHROTOMY WITH REMOVAL OF LOOSE BODIES, TALONAVICULAR JOINT ARTHOTOMY WITH REMOVAL OF LOOSE BODIES;  Surgeon: Terance Hart, MD;  Location: [facility];  Service:  Orthopedics;  Laterality: Left;  . Rotor Cuff  2012   Right Shoulder     reports that he has never smoked. He has never used smokeless tobacco. He reports previous alcohol use. He reports previous drug use. Drug: Benzodiazepines.   Family History  Problem Relation Age of Onset  . Breast cancer Mother   . Kidney failure Mother   . Stroke Mother   . Colon cancer Father   . Bladder Cancer Father   . Prostate cancer Father   . Hypertension Sister   . Hypothyroidism Sister   . Healthy Sister   . Healthy Daughter   . Healthy Son   . Healthy Son     Physical Exam:   Vitals:   11/05/20 0630 11/05/20 0700 11/05/20 0730 11/05/20 0800  BP: (!) 144/83 138/84 (!) 143/78 124/71  Pulse: (!) 113 (!) 110 (!) 107 (!) 107  Resp: 20 (!) 21 19 (!) 25  Temp:      TempSrc:  SpO2: 97% 97% 95% 94%  Weight:      Height:       Constitutional: NAD, calm, comfortable Eyes: PERRL, lids and conjunctivae normal ENMT: Mucous membranes are moist. Posterior pharynx clear of any exudate or lesions.Normal dentition.  Neck: normal, supple, no masses, no thyromegaly Respiratory: Diffuse rhonchi, mild wheezing, never any crackles, negative rubs, positive breath sounds diffusely Cardiovascular: Regular rate and rhythm, no murmurs / rubs / gallops. No extremity edema. 2+ pedal pulses. No carotid bruits.  Abdomen: no tenderness, no masses palpated. No hepatosplenomegaly. Bowel sounds positive.  Musculoskeletal: no clubbing / cyanosis. No joint deformity upper and lower extremities. Good ROM, no contractures. Normal muscle tone.  Neurologic: CN II-XII grossly intact. Sensation intact, DTR normal. Strength 5/5 in all 4.  Psychiatric: Normal judgment and insight. Alert and oriented x 3. Normal mood.  Skin: no rashes, lesions, ulcers. No induration Decubitus/ulcers:  Wounds: per nursing documentation         Labs on admission:    I have personally reviewed following labs and imaging  studies  CBC: Recent Labs  Lab 11/05/20 0311  WBC 8.3  NEUTROABS 5.6  HGB 13.8  HCT 43.1  MCV 91.3  PLT 173   Basic Metabolic Panel: Recent Labs  Lab 11/05/20 0311  NA 140  K 3.5  CL 102  CO2 29  GLUCOSE 107*  BUN 8  CREATININE 0.84  CALCIUM 8.7*   GFR: Estimated Creatinine Clearance: 105.2 mL/min (by C-G formula based on SCr of 0.84 mg/dL). Liver Function Tests: Recent Labs  Lab 11/05/20 0311  AST 19  ALT 17  ALKPHOS 71  BILITOT 0.3  PROT 7.8  ALBUMIN 4.3   No results for input(s): LIPASE, AMYLASE in the last 168 hours. No results for input(s): AMMONIA in the last 168 hours. Coagulation Profile: Recent Labs  Lab 11/05/20 0311  INR 0.9   Cardiac Enzymes: No results for input(s): CKTOTAL, CKMB, CKMBINDEX, TROPONINI in the last 168 hours. BNP (last 3 results) No results for input(s): PROBNP in the last 8760 hours. HbA1C: No results for input(s): HGBA1C in the last 72 hours. CBG: No results for input(s): GLUCAP in the last 168 hours. Lipid Profile: No results for input(s): CHOL, HDL, LDLCALC, TRIG, CHOLHDL, LDLDIRECT in the last 72 hours. Thyroid Function Tests: No results for input(s): TSH, T4TOTAL, FREET4, T3FREE, THYROIDAB in the last 72 hours. Anemia Panel: No results for input(s): VITAMINB12, FOLATE, FERRITIN, TIBC, IRON, RETICCTPCT in the last 72 hours. Urine analysis:    Component Value Date/Time   COLORURINE YELLOW 11/05/2020 0730   APPEARANCEUR CLEAR 11/05/2020 0730   LABSPEC 1.012 11/05/2020 0730   PHURINE 5.0 11/05/2020 0730   GLUCOSEU NEGATIVE 11/05/2020 0730   HGBUR SMALL (A) 11/05/2020 0730   BILIRUBINUR NEGATIVE 11/05/2020 0730   KETONESUR NEGATIVE 11/05/2020 0730   PROTEINUR NEGATIVE 11/05/2020 0730   NITRITE NEGATIVE 11/05/2020 0730   LEUKOCYTESUR NEGATIVE 11/05/2020 0730     Radiologic Exams on Admission:   CT Angio Chest PE W and/or Wo Contrast  Result Date: 11/05/2020 CLINICAL DATA:  Extensive history of aspiration  pneumonia EXAM: CT ANGIOGRAPHY CHEST WITH CONTRAST TECHNIQUE: Multidetector CT imaging of the chest was performed using the standard protocol during bolus administration of intravenous contrast. Multiplanar CT image reconstructions and MIPs were obtained to evaluate the vascular anatomy. CONTRAST:  OMNIPAQUE IOHEXOL 350 MG/ML SOLN COMPARISON:  Radiograph 11/05/2020, CT angiography 08/05/2020 FINDINGS: Cardiovascular: Satisfactory opacification the pulmonary arteries to the segmental level. No pulmonary artery filling  defects are identified. Central pulmonary arteries are normal caliber. Normal heart size. No pericardial effusion. The aortic root is suboptimally assessed given cardiac pulsation artifact. The aorta is normal caliber. No acute luminal abnormality of the imaged aorta. No periaortic stranding or hemorrhage. Shared origin of the brachiocephalic and left common carotid arteries. Proximal great vessels are free of acute abnormality. No major venous abnormalities are seen. Mediastinum/Nodes: Stable appearance of a 1.5 cm right hilar lymph node, not significantly changed from most recent comparison (4/44). Additional stable right infrahilar lymph node measuring 10 mm (4/52). No new or enlarged adenopathy is seen. Several additional scattered subcentimeter mediastinal and hilar nodes are present, favored to be reactive. Patulous, fluid-filled thoracic esophagus to the level of the thoracic inlet. Posterior bowing of the trachea compatible with imaging during exhalation. Mild central airways thickening, as described below. Thyroid gland and thoracic inlet are unremarkable. Lungs/Pleura: Diffusely thickened central airways with scattered secretions in several more fluid-filled airways particularly in the right middle and lower lobe. Multifocal areas of mixed reticulonodular and patchy ground-glass opacities throughout both lungs, such an appearance and distribution is quite similar to the comparison exam.  No pneumothorax. No visible layering pleural effusion. Mild paraseptal emphysematous changes. Some interlobular septal thickening and vascular redistribution could reflect mild superimposed interstitial edematous change. Upper Abdomen: Heterogeneously enhancing spleen is likely related to contrast timing. No acute abnormalities present in the visualized portions of the upper abdomen. Minimal upper abdominal atherosclerosis. Musculoskeletal: Degenerative changes are present in the imaged spine and shoulders. No acute osseous abnormality or suspicious osseous lesion. No worrisome chest wall masses or lesions. Review of the MIP images confirms the above findings. IMPRESSION: 1. No evidence of acute pulmonary artery filling defects. 2. Multifocal areas of mixed reticulonodular and patchy ground-glass opacities throughout both lungs, on a background of diffusely fluid-filled and thickened airways. Appearance could reflect atypical infection or sequela of chronic and ongoing aspiration particularly given the presence of the patulous fluid-filled thoracic esophagus. 3. Some interlobular septal thickening and vascular redistribution could reflect mild superimposed interstitial edematous change. 4. Stable appearance of enlarged right hilar lymph nodes, likely reactive and or edematous. 5. Emphysema (ICD10-J43.9). 6.  Aortic Atherosclerosis (ICD10-I70.0). Electronically Signed   By: Kreg ShropshirePrice  DeHay M.D.   On: 11/05/2020 05:39   DG Chest Portable 1 View  Result Date: 11/05/2020 CLINICAL DATA:  Hypoxia EXAM: PORTABLE CHEST 1 VIEW COMPARISON:  10/24/2020 FINDINGS: The heart size and mediastinal contours are within normal limits. Both lungs are clear. The visualized skeletal structures are unremarkable. IMPRESSION: No active disease. Electronically Signed   By: Deatra RobinsonKevin  Herman M.D.   On: 11/05/2020 03:28    EKG:   Independently reviewed.  Orders placed or performed during the hospital encounter of 11/05/20  . ED EKG  . ED  EKG  . EKG 12-Lead  . EKG 12-Lead  . EKG 12-Lead   ---------------------------------------------------------------------------------------------------------------------------------------    Assessment / Plan:   Principal Problem:   Acute respiratory failure with hypoxia (HCC) Active Problems:   Hypoxia   Dysphagia   Esophageal stricture/stenosis/esophageal dysmotility with recurrent aspiration   Anxiety with depression   Essential hypertension   CHF (congestive heart failure) (HCC)   Hypothyroidism   Gastroesophageal reflux disease with esophagitis   Esophageal motility disorder   Pneumonia   Principal problem:   Acute respiratory failure with hypoxia - with pneumonia -Patient will be admitted to stepdown unit -Meets the sepsis criteria, likely source aspiration pneumonia - temp 98.7, PR 107, RR 25, BP  124/71, satting 94% on 6 L of oxygen -  lactic acid 2.8, 2.5, WBC 8.3, - CT Angiogram-negative for PE, positive for emphysema, multifocal pneumonia consistent with possible aspiration Chest x-ray clear -We will follow with labs, lactic acid, -We will follow sepsis protocol, fluid resuscitation, broad-spectrum antibiotics -Blood cultures been obtained, will try to obtain sputum culture  Aspiration pneumonia  -Likely due to dysphagia, esophageal dysmotility -Treatment of aspiration pneumonia as above  Dysphagia/ Esophageal stricture/stenosis/esophageal dysmotility with recurrent aspiration -Noted [facility], reports not available, has not been discussed with the patient -We will keep the patient n.p.o. for now -GI will be consulted for evaluation -Patient consulted for evaluation    Anxiety with depression -as needed IV Haldol/Ativan, .  If tolerated will resume p.o. Xanax   Essential hypertension -stable, as needed hydralazine, if tolerated will resume home meds   ?? CHF (congestive heart failure) (HCC) -no signs of CHF, not on diuretics    Hypothyroidism -Home dose Synthroid   Gastroesophageal reflux disease with esophagitis -PPI   Cultures:  -Blood cultures x2>> -Try to get sputum culture Antimicrobial: -Zosyn/vancomycin  Consults called:  Gastroenterologist will be consulted, speech  -------------------------------------------------------------------------------------------------------------------------------------------- DVT prophylaxis: SCD/Compression stockings and Heparin SQ Code Status:   Code Status: Full Code   Admission status: Patient will be admitted as Inpatient, with a greater than 2 midnight length of stay. Level of care: Telemetry   Family Communication:  none at bedside  (The above findings and plan of care has been discussed with patient in detail, the patient expressed understanding and agreement of above plan)  --------------------------------------------------------------------------------------------------------------------------------------------------  Disposition Plan: >3 days Status is: Inpatient  Remains inpatient appropriate because:Inpatient level of care appropriate due to severity of illness   Dispo: The patient is from: Home              Anticipated d/c is to: Home              Patient currently is not medically stable to d/c.   Difficult to place patient No         ----------------------------------------------------------------------------------------------------------------------------------------------------  Time spent: > than  55  Min.   SIGNED: Kendell Bane, MD, FHM. [facility],  Pager (Please use [email or web address] to page to text)  If 7PM-7AM, please contact night-coverage [email or web address],  11/05/2020, 8:17 AM

## 2020-11-05 NOTE — Progress Notes (Signed)
Pharmacy Antibiotic Note  Christian Ellison is a 60 y.o. male admitted on 11/05/2020 with aspiration pna.  Pharmacy has been consulted for Zosyn dosing.  Plan: Zosyn 3.375gm IV q8h Will f/u renal function, micro data, and pt's clinical condition   Height: 6' (182.9 cm) Weight: 90.7 kg (200 lb) IBW/kg (Calculated) : 77.6  Temp (24hrs), Avg:98.7 F (37.1 C), Min:98.7 F (37.1 C), Max:98.7 F (37.1 C)  No results for input(s): WBC, CREATININE, LATICACIDVEN, VANCOTROUGH, VANCOPEAK, VANCORANDOM, GENTTROUGH, GENTPEAK, GENTRANDOM, TOBRATROUGH, TOBRAPEAK, TOBRARND, AMIKACINPEAK, AMIKACINTROU, AMIKACIN in the last 168 hours.  Estimated Creatinine Clearance: 107.8 mL/min (by C-G formula based on SCr of 0.82 mg/dL).    No Known Allergies  Antimicrobials this admission: 3/22 Zosyn >>   Microbiology results: BCx:  UCx:  Thank you for allowing pharmacy to be a part of this patient's care.  Christoper Fabian, PharmD, BCPS Please see amion for complete clinical pharmacist phone list 11/05/2020 3:27 AM

## 2020-11-05 NOTE — ED Notes (Addendum)
Per Dr. Flossie Dibble, pt is NPO until GI sees him. Speech eval ordered and after assessment needs to put in a diet order.

## 2020-11-05 NOTE — Progress Notes (Signed)
Patient assessed for need of breathing treatment and low O2 sats. Upon arrival patient was 94% on 10 LPM bled into his CPAP unit. Breath sounds were clear and him and his wife were both in bed asleep. Will continue to monitor but no further therapy is indicated at this time. RN made aware and agrees with plan. Will call if anything changes.

## 2020-11-05 NOTE — ED Triage Notes (Signed)
Pt states he checked his oxygen at home and it read 68%  81% in triage.   Pt has an extensive history of aspiration pneumonia, due to a defect in esophagus

## 2020-11-05 NOTE — Plan of Care (Signed)

## 2020-11-05 NOTE — ED Provider Notes (Signed)
[facility] EMERGENCY DEPARTMENT Provider Note   CSN: [id number] Arrival date & time: 11/05/20  0241     History Chief Complaint  Patient presents with  . Aspiration    Christian MealsJohn G Ellison is a 60 y.o. male.  Patient with a history of recurrent aspiration pneumonia due to esophageal anatomical issue here with increased work of breathing and hypoxia with concern for aspiration again.  States he developed difficulty breathing over the past 2 days.  Wife checked his oxygen level at home and it was 68%.  Was 81% in triage.  He is on 6 L now at 96%.  He does feel better.  Does not wear oxygen at home. He has felt warm but denies any fever.  Denies any chest pain, abdominal pain.  Has had a few issues of vomiting. [facility] for a type of swallowing study but was told "squat".   The history is provided by the patient. The history is limited by the condition of the patient.       Past Medical History:  Diagnosis Date  . Anxiety   . Aspiration pneumonia (HCC) 05/26/2018   RLL  . Chronic pain   . Closed fracture of xiphoid process   . Esophageal stricture   . GERD (gastroesophageal reflux disease)   . 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   Marland Kitchen. Opiate abuse, continuous (HCC)   . Pulmonary edema 02/02/2018  . Sepsis due to pneumonia (HCC) 03/11/2019  . Sleep apnea    CPAP nightly    Patient Active Problem List   Diagnosis Date Noted  . Pneumonia 10/09/2020  . Elevated lactic acid level   . GERD (gastroesophageal reflux disease)   . Esophageal motility disorder   . Alcohol abuse, in remission 08/30/2020  . Gastroesophageal reflux disease with esophagitis 08/30/2020  . Moderate recurrent major depression (HCC) 08/30/2020  . Encounter to establish care 08/30/2020  . OSA on CPAP 08/30/2020  . Anxiety 06/12/2020  . Benzodiazepine dependence (HCC) 06/12/2020  . History of substance abuse (HCC) 06/12/2020  . Hypothyroidism 03/11/2019  . Recurrent  aspiration pneumonia (HCC)   . Essential hypertension 02/02/2018  . CHF (congestive heart failure) (HCC) 02/02/2018  . Dysphagia 02/02/2018  . Esophageal stricture/stenosis/esophageal dysmotility with recurrent aspiration 02/02/2018  . Esophageal stenosis 02/02/2018  . Transaminitis   . Hx of hepatitis C 01/17/2013  . Depression 01/17/2013  . Anxiety with depression 01/17/2013    Past Surgical History:  Procedure Laterality Date  . BIOPSY  03/18/2018   Procedure: BIOPSY;  Surgeon: Malissa Hippoehman, Najeeb U, MD;  Location: [facility];  Service: Endoscopy;;  gastric   . COLONOSCOPY N/A 11/22/2013   Dr. Karilyn Cotaehman: Normal except for hemorrhoids.  Next colonoscopy 10 years.  . ESOPHAGEAL DILATION N/A 03/18/2018   Procedure: ESOPHAGEAL DILATION;  Surgeon: Malissa Hippoehman, Najeeb U, MD;  Location: [facility];  Service: Endoscopy;  Laterality: N/A;  . ESOPHAGEAL DILATION N/A 08/07/2020   Procedure: ESOPHAGEAL DILATION;  Surgeon: Malissa Hippoehman, Najeeb U, MD;  Location: [facility];  Service: Endoscopy;  Laterality: N/A;  . ESOPHAGEAL MANOMETRY N/A 06/15/2018   Procedure: ESOPHAGEAL MANOMETRY (EM);  Surgeon: Napoleon FormNandigam, Kavitha V, MD;  Location: [facility];  Service: Endoscopy;  Laterality: N/A;  . ESOPHAGOGASTRODUODENOSCOPY (EGD) WITH PROPOFOL N/A 03/18/2018   Dr. Karilyn Cotaehman: Benign-appearing mild esophageal stenosis proximal to the GE J, status post dilation.  2 cm hiatal hernia.  Gastritis, biopsies negative for H. pylori  . ESOPHAGOGASTRODUODENOSCOPY (EGD) WITH  PROPOFOL N/A 08/07/2020   Procedure: ESOPHAGOGASTRODUODENOSCOPY (EGD) WITH PROPOFOL;  Surgeon: Malissa Hippo, MD;  Location: [facility];  Service: Endoscopy;  Laterality: N/A;  . Fracture Right Leg     Patient has rod/screws in this leg  . ORIF CALCANEOUS FRACTURE Left 07/04/2020   Procedure: OPEN REDUCTION INTERNAL FIXATION (ORIF) LEFT TALUS AND CALCANEOUS FRACTURE, SUBTALAR JOINT ARTHROTOMY WITH REMOVAL OF LOOSE BODIES, TALONAVICULAR JOINT ARTHOTOMY WITH  REMOVAL OF LOOSE BODIES;  Surgeon: Terance Hart, MD;  Location: [facility] OR;  Service: Orthopedics;  Laterality: Left;  . Rotor Cuff  2012   Right Shoulder       Family History  Problem Relation Age of Onset  . Breast cancer Mother   . Kidney failure Mother   . Stroke Mother   . Colon cancer Father   . Bladder Cancer Father   . Prostate cancer Father   . Hypertension Sister   . Hypothyroidism Sister   . Healthy Sister   . Healthy Daughter   . Healthy Son   . Healthy Son     Social History   Tobacco Use  . Smoking status: Never Smoker  . Smokeless tobacco: Never Used  Vaping Use  . Vaping Use: Never used  Substance Use Topics  . Alcohol use: Not Currently  . Drug use: Not Currently    Types: Benzodiazepines    Comment: opiates    Home Medications Prior to Admission medications   Medication Sig Start Date End Date Taking? Authorizing Provider  acetaminophen (TYLENOL) 325 MG tablet Take 2 tablets (650 mg total) by mouth every 6 (six) hours as needed for mild pain (or Fever >/= 101). 05/17/19   Shon Hale, MD  albuterol (VENTOLIN HFA) 108 (90 Base) MCG/ACT inhaler Inhale 2 puffs into the lungs every 6 (six) hours as needed for wheezing or shortness of breath. 09/09/20   Anabel Halon, MD  ALPRAZolam Prudy Feeler) 1 MG tablet Take 1 mg by mouth every 6 (six) hours as needed for anxiety.  10/22/19   [provider]  aspirin EC 81 MG tablet Take 1 tablet (81 mg total) by mouth daily with breakfast. Patient taking differently: Take 81 mg by mouth at bedtime. 03/13/19   Shon Hale, MD  Cimetidine (TAGAMET PO) Take 75 mg by mouth daily as needed (if he feels his food coming up after meal).    [provider]  diltiazem (DILACOR XR) 120 MG 24 hr capsule Take 1 capsule (120 mg total) by mouth daily. 08/17/20   Danford, Earl Lites, MD  Glucosamine HCl 1000 MG TABS Take 1,000 mg by mouth at bedtime.    [provider]  levothyroxine (SYNTHROID) 25  MCG tablet Take 1 tablet (25 mcg total) by mouth daily. 09/10/20   Anabel Halon, MD  Multiple Vitamin (MULTIVITAMIN WITH MINERALS) TABS tablet Take 1 tablet by mouth daily. 50+    [provider]  pantoprazole (PROTONIX) 40 MG tablet Take 1 tablet (40 mg total) by mouth 2 (two) times daily. 08/07/20 08/07/21  Shon Hale, MD    Allergies    Patient has no known allergies.  Review of Systems   Review of Systems  Constitutional: Positive for activity change, appetite change and fever.  HENT: Negative for congestion and rhinorrhea.   Respiratory: Positive for shortness of breath.   Gastrointestinal: Positive for nausea and vomiting.  Genitourinary: Negative for dysuria and hematuria.  Musculoskeletal: Positive for arthralgias and myalgias.  Skin: Negative for wound.  Neurological:  Positive for weakness.   all other systems are negative except as noted in the HPI and PMH.   Physical Exam Updated Vital Signs BP (!) 155/93   Pulse 95   Temp 98.7 F (37.1 C) (Oral)   Resp (!) 22   Ht 6' (1.829 m)   Wt 90.7 kg   SpO2 (!) 81%   BMI 27.12 kg/m   Physical Exam Vitals and nursing note reviewed.  Constitutional:      General: He is in acute distress.     Appearance: He is well-developed. He is ill-appearing.     Comments: Mild increased work of breathing, ill-appearing  HENT:     Head: Normocephalic and atraumatic.     Mouth/Throat:     Pharynx: No oropharyngeal exudate.  Eyes:     Conjunctiva/sclera: Conjunctivae normal.     Pupils: Pupils are equal, round, and reactive to light.  Neck:     Comments: No meningismus. Cardiovascular:     Rate and Rhythm: Normal rate and regular rhythm.     Heart sounds: Normal heart sounds. No murmur heard.   Pulmonary:     Effort: Respiratory distress present.     Breath sounds: Rhonchi present.     Comments: Rhonchi bilaterally, increased work  breathing Abdominal:     Palpations: Abdomen is soft.     Tenderness: There  is no abdominal tenderness. There is no guarding or rebound.  Musculoskeletal:        General: No tenderness. Normal range of motion.     Cervical back: Normal range of motion and neck supple.  Skin:    General: Skin is warm.  Neurological:     Mental Status: He is alert and oriented to person, place, and time.     Cranial Nerves: No cranial nerve deficit.     Motor: No abnormal muscle tone.     Coordination: Coordination normal.     Comments: No ataxia on finger to nose bilaterally. No pronator drift. 5/5 strength throughout. CN 2-12 intact.Equal grip strength. Sensation intact.   Psychiatric:        Behavior: Behavior normal.     ED Results / Procedures / Treatments   Labs (all labs ordered are listed, but only abnormal results are displayed) Labs Reviewed  LACTIC ACID, PLASMA - Abnormal; Notable for the following components:      Result Value   Lactic Acid, Venous 2.8 (*)    All other components within normal limits  LACTIC ACID, PLASMA - Abnormal; Notable for the following components:   Lactic Acid, Venous 2.5 (*)    All other components within normal limits  COMPREHENSIVE METABOLIC PANEL - Abnormal; Notable for the following components:   Glucose, Bld 107 (*)    Calcium 8.7 (*)    All other components within normal limits  RESP PANEL BY RT-PCR (FLU A&B, COVID) ARPGX2  CULTURE, BLOOD (ROUTINE X 2)  CULTURE, BLOOD (ROUTINE X 2)  URINE CULTURE  CBC WITH DIFFERENTIAL/PLATELET  PROTIME-INR  APTT  URINALYSIS, ROUTINE W REFLEX MICROSCOPIC    EKG EKG Interpretation  Date/Time:  Tuesday November 05 2020 03:20:08 EDT Ventricular Rate:  108 PR Interval:    QRS Duration: 84 QT Interval:  339 QTC Calculation: 455 R Axis:   -13 Text Interpretation: Sinus tachycardia Probable left atrial enlargement Borderline T wave abnormalities No significant change was found Confirmed by Glynn Octave [phone]) on 11/05/2020 4:49:22 AM   Radiology CT Angio Chest PE W and/or Wo  Contrast  Result Date: 11/05/2020 CLINICAL DATA:  Extensive history of aspiration pneumonia EXAM: CT ANGIOGRAPHY CHEST WITH CONTRAST TECHNIQUE: Multidetector CT imaging of the chest was performed using the standard protocol during bolus administration of intravenous contrast. Multiplanar CT image reconstructions and MIPs were obtained to evaluate the vascular anatomy. CONTRAST:  OMNIPAQUE IOHEXOL 350 MG/ML SOLN COMPARISON:  Radiograph 11/05/2020, CT angiography 08/05/2020 FINDINGS: Cardiovascular: Satisfactory opacification the pulmonary arteries to the segmental level. No pulmonary artery filling defects are identified. Central pulmonary arteries are normal caliber. Normal heart size. No pericardial effusion. The aortic root is suboptimally assessed given cardiac pulsation artifact. The aorta is normal caliber. No acute luminal abnormality of the imaged aorta. No periaortic stranding or hemorrhage. Shared origin of the brachiocephalic and left common carotid arteries. Proximal great vessels are free of acute abnormality. No major venous abnormalities are seen. Mediastinum/Nodes: Stable appearance of a 1.5 cm right hilar lymph node, not significantly changed from most recent comparison (4/44). Additional stable right infrahilar lymph node measuring 10 mm (4/52). No new or enlarged adenopathy is seen. Several additional scattered subcentimeter mediastinal and hilar nodes are present, favored to be reactive. Patulous, fluid-filled thoracic esophagus to the level of the thoracic inlet. Posterior bowing of the trachea compatible with imaging during exhalation. Mild central airways thickening, as described below. Thyroid gland and thoracic inlet are unremarkable. Lungs/Pleura: Diffusely thickened central airways with scattered secretions in several more fluid-filled airways particularly in the right middle and lower lobe. Multifocal areas of mixed reticulonodular and patchy ground-glass opacities throughout both  lungs, such an appearance and distribution is quite similar to the comparison exam. No pneumothorax. No visible layering pleural effusion. Mild paraseptal emphysematous changes. Some interlobular septal thickening and vascular redistribution could reflect mild superimposed interstitial edematous change. Upper Abdomen: Heterogeneously enhancing spleen is likely related to contrast timing. No acute abnormalities present in the visualized portions of the upper abdomen. Minimal upper abdominal atherosclerosis. Musculoskeletal: Degenerative changes are present in the imaged spine and shoulders. No acute osseous abnormality or suspicious osseous lesion. No worrisome chest wall masses or lesions. Review of the MIP images confirms the above findings. IMPRESSION: 1. No evidence of acute pulmonary artery filling defects. 2. Multifocal areas of mixed reticulonodular and patchy ground-glass opacities throughout both lungs, on a background of diffusely fluid-filled and thickened airways. Appearance could reflect atypical infection or sequela of chronic and ongoing aspiration particularly given the presence of the patulous fluid-filled thoracic esophagus. 3. Some interlobular septal thickening and vascular redistribution could reflect mild superimposed interstitial edematous change. 4. Stable appearance of enlarged right hilar lymph nodes, likely reactive and or edematous. 5. Emphysema (ICD10-J43.9). 6.  Aortic Atherosclerosis (ICD10-I70.0). Electronically Signed   By: Kreg Shropshire M.D.   On: 11/05/2020 05:39   DG Chest Portable 1 View  Result Date: 11/05/2020 CLINICAL DATA:  Hypoxia EXAM: PORTABLE CHEST 1 VIEW COMPARISON:  10/24/2020 FINDINGS: The heart size and mediastinal contours are within normal limits. Both lungs are clear. The visualized skeletal structures are unremarkable. IMPRESSION: No active disease. Electronically Signed   By: Deatra Robinson M.D.   On: 11/05/2020 03:28    Procedures .Critical Care Performed  by: Glynn Octave, MD Authorized by: Glynn Octave, MD   Critical care provider statement:    Critical care time (minutes):  45   Critical care was necessary to treat or prevent imminent or life-threatening deterioration of the following conditions:  Sepsis and respiratory failure   Critical care was time spent personally by me on the following activities:  Discussions with consultants, evaluation of patient's response to treatment, examination of patient, ordering and performing treatments and interventions, ordering and review of laboratory studies, ordering and review of radiographic studies, pulse oximetry, re-evaluation of patient's condition, obtaining history from patient or surrogate and review of old charts     Medications Ordered in ED Medications  albuterol (VENTOLIN HFA) 108 (90 Base) MCG/ACT inhaler 2 puff (has no administration in time range)  lactated ringers infusion (has no administration in time range)  lactated ringers bolus 1,000 mL (has no administration in time range)  piperacillin-tazobactam (ZOSYN) IVPB 3.375 g (has no administration in time range)  piperacillin-tazobactam (ZOSYN) IVPB 3.375 g (has no administration in time range)    ED Course  I have reviewed the triage vital signs and the nursing notes.  Pertinent labs & imaging results that were available during my care of the patient were reviewed by me and considered in my medical decision making (see chart for details).    MDM Rules/Calculators/A&P                         History of recurrent pneumonia here with new hypoxia and concern for aspiration pneumonia.  Requiring 6 L of oxygen to keep sats above 92.  X-ray labs will be obtained including cultures with septic work-up pursued. He is not febrile on arrival. Forego 20 cc/kg bolus at this point. Will give gentle hydration as well as antibiotics for suspected aspiration after cultures are obtained.  Chest x-ray shows no obvious infiltrate With  large oxygen requirement that is new, will proceed with CT angiogram to rule out pulmonary embolism.  CT is negative for pulmonary embolism but does show suspected acute on chronic aspiration changes.  Patient will need admission given his new oxygen requirements.  Tachycardic but afebrile in the ED.  Tachypnea has improved.  Continue IV fluids.  Continue antibiotics.  Admission discussed with Dr. Carren Rang.   Final Clinical Impression(s) / ED Diagnoses Final diagnoses:  None    Rx / DC Orders ED Discharge Orders    None       Rancour, Jeannett Senior, MD 11/05/20 0730

## 2020-11-05 NOTE — Consult Note (Addendum)
@LOGO @   Referring Provider: [facility]  Primary Care Physician:  , MD Primary Gastroenterologist:  Dr. Anabel Halon  Date of Admission: 11/05/20 Date of Consultation: 11/05/20  Reason for Consultation: Esophageal stricture and recurrent aspiration  HPI:  Christian Ellison is a 60 y.o. year old male hx of GERD, esophageal strictures, esophageal dysmotility with manometry in 2019 revealing LES pressure elevated with incomplete relaxation felt to be consistent with EG J outflow obstruction but not meeting criteria for achalasia, recurrant hospitalizations for aspiration pneumonia who presented to the emergency room today with increased work of breathing and hypoxia x2 days.  ED course: SPO2 81%, tachycardia, mild hypertension, mild tachycardia, T-max 100.7.  Started on 6 L supplemental O2 via nasal cannula. Lactic acid 2.8, CBC unremarkable, CMP essentially normal. Respiratory panel negative. Blood cultures drawn and pending. Chest x-ray with no active disease. CTA chest with changes that could represent atypical infection versus sequela of chronic and ongoing aspiration.  Also with patulous fluid-filled thoracic esophagus the level of the thoracic inlet.  Today: Patient is lethargic. States he notices increased work of breathing yesterday and noticed his O2 saturation was in the 70 range so he came to the emergency room. He continues to have ongoing trouble with feeling foods and liquids get hung in his lower esophagus. Regurgitation about 3 times a week. No particular time of day or with any specific trigger. Some days are just better than others. Trying to avoid tough meats, and eat and drink slowly as symptoms are worse if eating quickly. Also trying to stay upright for 1-2 hours after eating, but this doesn't always happen. Continues taking diltiazem 120 mg XR daily and PPI BID which keeps GERD symptoms fairly well controlled. No nausea/vomiting, constipation, diarrhea,  brbpr, or melena. Last meal around 5-6 pm last night. Had hamburger steak.   He has sleep apnea and wears a CPAP at night.   No SOB at this time on 6L. No Chest pain or palpitations.   Notably, patient is lethargic and somewhat difficult to obtain detailed timeline from. He is oriented x4.    Recent GI Evaluation/workup:  EGD December 2021 with fluid in the distal esophagus, distal esophageal/lower esophageal sphincter spasticity s/p dilation.  He was to resume diltiazem 30 mg before meals and arrange manometry as outpatient.  He was referred to tertiary care for further evaluation and treatment.  Office visit with [facility] 10/18/2020 with plans for barium swallow study, repeat manometry and EGD with FLIP and Botox.  Suspected myotomy in future.  Double contrast barium swallow completed 11/04/2020: Esophagus negative for stricture, ulceration or diverticula.  Residual contrast visualized in the lower esophagus at 2 minutes and 5 minutes.  No hiatal hernia.  Appears he is scheduled for EGD with FLIP and Botox on ?4/25 at [facility].   Past Medical History:  Diagnosis Date  . Anxiety   . Aspiration pneumonia (HCC) 05/26/2018   RLL  . Chronic pain   . Closed fracture of xiphoid process   . Esophageal stricture   . GERD (gastroesophageal reflux disease)   . 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   03/2018 Opiate abuse, continuous (HCC)   . Pulmonary edema 02/02/2018  . Sepsis due to pneumonia (HCC) 03/11/2019  . Sleep apnea    CPAP nightly    Past Surgical History:  Procedure Laterality Date  . BIOPSY  03/18/2018   Procedure: BIOPSY;  Surgeon: 05/18/2018, MD;  [facility]: [facility];  Service: Endoscopy;;  gastric   . COLONOSCOPY N/A 11/22/2013   Dr. Karilyn Cota: Normal except for hemorrhoids.  Next colonoscopy 10 years.  . ESOPHAGEAL DILATION N/A 03/18/2018   Procedure: ESOPHAGEAL DILATION;  Surgeon: Malissa Hippo, MD;  Location: [facility];  Service: Endoscopy;  Laterality: N/A;   . ESOPHAGEAL DILATION N/A 08/07/2020   Procedure: ESOPHAGEAL DILATION;  Surgeon: Malissa Hippo, MD;  Location: [facility];  Service: Endoscopy;  Laterality: N/A;  . ESOPHAGEAL MANOMETRY N/A 06/15/2018   Procedure: ESOPHAGEAL MANOMETRY (EM);  Surgeon: Napoleon Form, MD;  Location: [facility];  Service: Endoscopy;  Laterality: N/A;  . ESOPHAGOGASTRODUODENOSCOPY (EGD) WITH PROPOFOL N/A 03/18/2018   Dr. Karilyn Cota: Benign-appearing mild esophageal stenosis proximal to the GE J, status post dilation.  2 cm hiatal hernia.  Gastritis, biopsies negative for H. pylori  . ESOPHAGOGASTRODUODENOSCOPY (EGD) WITH PROPOFOL N/A 08/07/2020   Procedure: ESOPHAGOGASTRODUODENOSCOPY (EGD) WITH PROPOFOL;  Surgeon: Malissa Hippo, MD;  [facility];  Service: Endoscopy;  Laterality: N/A;  . Fracture Right Leg     Patient has rod/screws in this leg  . ORIF CALCANEOUS FRACTURE Left 07/04/2020   Procedure: OPEN REDUCTION INTERNAL FIXATION (ORIF) LEFT TALUS AND CALCANEOUS FRACTURE, SUBTALAR JOINT ARTHROTOMY WITH REMOVAL OF LOOSE BODIES, TALONAVICULAR JOINT ARTHOTOMY WITH REMOVAL OF LOOSE BODIES;  Surgeon: Terance Hart, MD;  Location: [facility];  Service: Orthopedics;  Laterality: Left;  . Rotor Cuff  2012   Right Shoulder    Prior to Admission medications   Medication Sig Start Date End Date Taking? Authorizing Provider  acetaminophen (TYLENOL) 325 MG tablet Take 2 tablets (650 mg total) by mouth every 6 (six) hours as needed for mild pain (or Fever >/= 101). 05/17/19  Yes Emokpae, Courage, MD  albuterol (VENTOLIN HFA) 108 (90 Base) MCG/ACT inhaler Inhale 2 puffs into the lungs every 6 (six) hours as needed for wheezing or shortness of breath. 09/09/20  Yes Allena Katz, Earlie Lou, MD  ALPRAZolam Prudy Feeler) 1 MG tablet Take 1 mg by mouth every 6 (six) hours as needed for anxiety.  10/22/19  Yes [provider]  aspirin EC 81 MG tablet Take 1 tablet (81 mg total) by mouth daily with breakfast. Patient  taking differently: Take 81 mg by mouth at bedtime. 03/13/19  Yes Shon Hale, MD  Cimetidine (TAGAMET PO) Take 75 mg by mouth daily as needed (if he feels his food coming up after meal).   Yes [provider]  diltiazem (DILACOR XR) 120 MG 24 hr capsule Take 1 capsule (120 mg total) by mouth daily. 08/17/20  Yes Danford, Earl Lites, MD  Glucosamine HCl 1000 MG TABS Take 1,000 mg by mouth at bedtime.   Yes [provider]  levothyroxine (SYNTHROID) 25 MCG tablet Take 1 tablet (25 mcg total) by mouth daily. 09/10/20  Yes Anabel Halon, MD  Multiple Vitamin (MULTIVITAMIN WITH MINERALS) TABS tablet Take 1 tablet by mouth daily. 50+   Yes [provider]  pantoprazole (PROTONIX) 40 MG tablet Take 1 tablet (40 mg total) by mouth 2 (two) times daily. 08/07/20 08/07/21 Yes Shon Hale, MD    Current Facility-Administered Medications  Medication Dose Route Frequency Provider Last Rate Last Admin  . 0.9 %  sodium chloride infusion  250 mL Intravenous PRN Shahmehdi, Seyed A, MD      . acetaminophen (TYLENOL) tablet 650 mg  650 mg Oral Q6H PRN Shahmehdi, Gemma Payor, MD       Or  . acetaminophen (  TYLENOL) suppository 650 mg  650 mg Rectal Q6H PRN Shahmehdi, Seyed A, MD      . albuterol (VENTOLIN HFA) 108 (90 Base) MCG/ACT inhaler 2 puff  2 puff Inhalation Q2H PRN Rancour, Stephen, MD      . ALPRAZolam Prudy Feeler) tablet 1 mg  1 mg Oral Q6H PRN Shahmehdi, Seyed A, MD      . aspirin EC tablet 81 mg  81 mg Oral QHS Shahmehdi, Seyed A, MD      . bisacodyl (DULCOLAX) EC tablet 5 mg  5 mg Oral Daily PRN Shahmehdi, Seyed A, MD      . diltiazem (DILACOR XR) 24 hr capsule 120 mg  120 mg Oral Daily Shahmehdi, Seyed A, MD      . famotidine (PEPCID) tablet 40 mg  40 mg Oral Daily Shahmehdi, Seyed A, MD      . heparin injection 5,000 Units  5,000 Units Subcutaneous Q8H Shahmehdi, Seyed A, MD      . hydrALAZINE (APRESOLINE) injection 10 mg  10 mg Intravenous Q4H PRN Shahmehdi, Seyed A, MD       . HYDROmorphone (DILAUDID) injection 0.5-1 mg  0.5-1 mg Intravenous Q2H PRN Shahmehdi, Seyed A, MD      . ipratropium (ATROVENT) nebulizer solution 0.5 mg  0.5 mg Nebulization Q6H PRN Shahmehdi, Seyed A, MD      . lactated ringers infusion   Intravenous Continuous Rancour, Jeannett Senior, MD 150 mL/hr at 11/05/20 0407 New Bag at 11/05/20 0407  . levalbuterol (XOPENEX) nebulizer solution 0.63 mg  0.63 mg Nebulization Q6H PRN Shahmehdi, Seyed A, MD      . levothyroxine (SYNTHROID) tablet 25 mcg  25 mcg Oral Daily Shahmehdi, Seyed A, MD      . magnesium citrate solution 1 Bottle  1 Bottle Oral Once PRN Shahmehdi, Seyed A, MD      . ondansetron (ZOFRAN) tablet 4 mg  4 mg Oral Q6H PRN Shahmehdi, Seyed A, MD       Or  . ondansetron (ZOFRAN) injection 4 mg  4 mg Intravenous Q6H PRN Shahmehdi, Seyed A, MD      . oxyCODONE (Oxy IR/ROXICODONE) immediate release tablet 5 mg  5 mg Oral Q4H PRN Shahmehdi, Seyed A, MD      . piperacillin-tazobactam (ZOSYN) IVPB 3.375 g  3.375 g Intravenous Q8H Titus Mould, RPH      . senna-docusate (Senokot-S) tablet 1 tablet  1 tablet Oral QHS PRN Shahmehdi, Seyed A, MD      . sodium chloride flush (NS) 0.9 % injection 3 mL  3 mL Intravenous Q12H Shahmehdi, Seyed A, MD      . sodium chloride flush (NS) 0.9 % injection 3 mL  3 mL Intravenous Q12H Shahmehdi, Seyed A, MD      . sodium chloride flush (NS) 0.9 % injection 3 mL  3 mL Intravenous PRN Shahmehdi, Seyed A, MD      . vancomycin (VANCOREADY) IVPB 1500 mg/300 mL  1,500 mg Intravenous Q12H Shahmehdi, Seyed A, MD 150 mL/hr at 11/05/20 0937 1,500 mg at 11/05/20 0937    Allergies as of 11/05/2020  . (No Known Allergies)    Family History  Problem Relation Age of Onset  . Breast cancer Mother   . Kidney failure Mother   . Stroke Mother   . Colon cancer Father   . Bladder Cancer Father   . Prostate cancer Father   . Hypertension Sister   . Hypothyroidism Sister   . Healthy Sister   .  Healthy Daughter   . Healthy  Son   . Healthy Son     Social History   Socioeconomic History  . Marital status: Married    Spouse name: Not on file  . Number of children: Not on file  . Years of education: Not on file  . Highest education level: Not on file  Occupational History  . Occupation: unemployed  Tobacco Use  . Smoking status: Never Smoker  . Smokeless tobacco: Never Used  Vaping Use  . Vaping Use: Never used  Substance and Sexual Activity  . Alcohol use: Not Currently  . Drug use: Not Currently    Types: Benzodiazepines    Comment: opiates  . Sexual activity: Not on file  Other Topics Concern  . Not on file  Social History Narrative  . Not on file   Social Determinants of Health   Financial Resource Strain: Not on file  Food Insecurity: Not on file  Transportation Needs: Not on file  Physical Activity: Not on file  Stress: Not on file  Social Connections: Not on file  Intimate Partner Violence: Not on file    Review of Systems: Gen: Admits to fever. No lightheadedness or pre-syncope.  CV: Denies chest pain or heart palpitations Resp: SOB improved with supplements O2.  GI: See HPI Heme: See HPI  Physical Exam: Vital signs in last 24 hours: Temp:  [98.7 F (37.1 C)-100.7 F (38.2 C)] 99.1 F (37.3 C) (03/22 1000) Pulse Rate:  [83-115] 83 (03/22 1000) Resp:  [13-28] 22 (03/22 1000) BP: (94-155)/(61-93) 94/64 (03/22 1000) SpO2:  [81 %-97 %] 97 % (03/22 1000) Weight:  [90.7 kg] 90.7 kg (03/22 0254)   General:   Lethargic but arouses easily. Ill appearing. Pleasant and cooperative in NAD Head:  Normocephalic and atraumatic. Eyes:  Sclera clear, no icterus.   Conjunctiva pink. Ears:  Normal auditory acuity. Lungs:  Rhonchi and few scattered crackles appreciated throughout lung fields bilaterally. No acute distress. Heart:  Regular rate and rhythm; no murmurs, clicks, rubs,  or gallops. Abdomen:  Soft, nontender and nondistended. No masses, hepatosplenomegaly or hernias noted.  Normal bowel sounds, without guarding, and without rebound.   Rectal:  Deferred  Msk:  Symmetrical without gross deformities. Normal posture. Extremities:  With trace bilateral LE edema. Neurologic:  Lethargic but arouses easily. Some slowed speech but oriented x4. Grossly normal neurologically. Skin:  Intact without significant lesions or rashes. Psych:  Flat affect.  Intake/Output from previous day: No intake/output data recorded. Intake/Output this shift: Total I/O In: 7692.3 [IV Piggyback:7692.3] Out: -   Lab Results: Recent Labs    11/05/20 0311  WBC 8.3  HGB 13.8  HCT 43.1  PLT 173   BMET Recent Labs    11/05/20 0311  NA 140  K 3.5  CL 102  CO2 29  GLUCOSE 107*  BUN 8  CREATININE 0.84  CALCIUM 8.7*   LFT Recent Labs    11/05/20 0311  PROT 7.8  ALBUMIN 4.3  AST 19  ALT 17  ALKPHOS 71  BILITOT 0.3   PT/INR Recent Labs    11/05/20 0311  LABPROT 11.8  INR 0.9   Studies/Results: CT Angio Chest PE W and/or Wo Contrast  Result Date: 11/05/2020 CLINICAL DATA:  Extensive history of aspiration pneumonia EXAM: CT ANGIOGRAPHY CHEST WITH CONTRAST TECHNIQUE: Multidetector CT imaging of the chest was performed using the standard protocol during bolus administration of intravenous contrast. Multiplanar CT image reconstructions and MIPs were obtained to evaluate the  vascular anatomy. CONTRAST:  OMNIPAQUE IOHEXOL 350 MG/ML SOLN COMPARISON:  Radiograph 11/05/2020, CT angiography 08/05/2020 FINDINGS: Cardiovascular: Satisfactory opacification the pulmonary arteries to the segmental level. No pulmonary artery filling defects are identified. Central pulmonary arteries are normal caliber. Normal heart size. No pericardial effusion. The aortic root is suboptimally assessed given cardiac pulsation artifact. The aorta is normal caliber. No acute luminal abnormality of the imaged aorta. No periaortic stranding or hemorrhage. Shared origin of the brachiocephalic and left  common carotid arteries. Proximal great vessels are free of acute abnormality. No major venous abnormalities are seen. Mediastinum/Nodes: Stable appearance of a 1.5 cm right hilar lymph node, not significantly changed from most recent comparison (4/44). Additional stable right infrahilar lymph node measuring 10 mm (4/52). No new or enlarged adenopathy is seen. Several additional scattered subcentimeter mediastinal and hilar nodes are present, favored to be reactive. Patulous, fluid-filled thoracic esophagus to the level of the thoracic inlet. Posterior bowing of the trachea compatible with imaging during exhalation. Mild central airways thickening, as described below. Thyroid gland and thoracic inlet are unremarkable. Lungs/Pleura: Diffusely thickened central airways with scattered secretions in several more fluid-filled airways particularly in the right middle and lower lobe. Multifocal areas of mixed reticulonodular and patchy ground-glass opacities throughout both lungs, such an appearance and distribution is quite similar to the comparison exam. No pneumothorax. No visible layering pleural effusion. Mild paraseptal emphysematous changes. Some interlobular septal thickening and vascular redistribution could reflect mild superimposed interstitial edematous change. Upper Abdomen: Heterogeneously enhancing spleen is likely related to contrast timing. No acute abnormalities present in the visualized portions of the upper abdomen. Minimal upper abdominal atherosclerosis. Musculoskeletal: Degenerative changes are present in the imaged spine and shoulders. No acute osseous abnormality or suspicious osseous lesion. No worrisome chest wall masses or lesions. Review of the MIP images confirms the above findings. IMPRESSION: 1. No evidence of acute pulmonary artery filling defects. 2. Multifocal areas of mixed reticulonodular and patchy ground-glass opacities throughout both lungs, on a background of diffusely fluid-filled  and thickened airways. Appearance could reflect atypical infection or sequela of chronic and ongoing aspiration particularly given the presence of the patulous fluid-filled thoracic esophagus. 3. Some interlobular septal thickening and vascular redistribution could reflect mild superimposed interstitial edematous change. 4. Stable appearance of enlarged right hilar lymph nodes, likely reactive and or edematous. 5. Emphysema (ICD10-J43.9). 6.  Aortic Atherosclerosis (ICD10-I70.0). Electronically Signed   By: Kreg Shropshire M.D.   On: 11/05/2020 05:39   DG Chest Portable 1 View  Result Date: 11/05/2020 CLINICAL DATA:  Hypoxia EXAM: PORTABLE CHEST 1 VIEW COMPARISON:  10/24/2020 FINDINGS: The heart size and mediastinal contours are within normal limits. Both lungs are clear. The visualized skeletal structures are unremarkable. IMPRESSION: No active disease. Electronically Signed   By: Deatra Robinson M.D.   On: 11/05/2020 03:28    Impression: 60 year old male with history of GERD, esophageal strictures with last dilation in December 2021, and esophageal dysmotility with elevated LES pressure and incomplete relaxation felt to be consistent with EGJ outflow obstruction on manometry in 2019, maintained on diltiazem XR, and recurrent hospitalizations for aspiration pneumonia who presented early this morning to the emergency room due to hypoxia and increased work of breathing at home.  He was found to have O2 saturation 81%, T-max 100.7, mild tachycardia, mild hypertension, lactic acid 2.8, and CTA chest with changes it could represent atypical infection versus sequela of chronic and ongoing aspiration.  Also with patulous fluid-filled thoracic esophagus to the level of  the thoracic inlet.   Notably, patient has established with [facility] for further evaluation and treatment of dysphagia.  Double contrast barium swallow completed 11/04/2020 at [facility] with no esophageal stricture, ulceration, or diverticula.  Residual  contrast visualized in the lower esophagus at 2 minutes and 5 minutes.  No hiatal hernia.  There are plans for EGD with epilepsy and Botox.  Possible myotomy in the future.  Appears EGD is scheduled 4/25.  Suspect dysphagia is secondary to motility disorder, likely achalasia. Tries to eat and drink slowly, avoid tough textures, stay upright 1-2 hours after eating (although doesn't always do this), takes Diltiazem 120 mg XR daily and PPI twice daily takes, but he continues with dysphagia to solids and liquids and intermittent regurgitation up to 3 times a week. Currently burping up frothy substance intermittently.   I do not see a role for repeat EGD this admission as no stricture was noted on swallow study completed yesterday at [facility].  He would benefit from procedures with [facility] ASAP to prevent redcurrant hospitalizations. Discussed possibility of Botox here at [facility]. Dr. Levon Hedger recommends against Botox as he suspects POEM would serve patient better.    Aspiration pneumonia: Management per hospitalist.  Currently on broad spectrum antibiotics and supplemental O2 6L stating 96%.    Plan: 1.  Keep NPO for now in light of fluid filled esophagus and ongoing burping up frothy material.  2.  Keep head of bed elevated at least 60 degrees.  3.  No role for repeat EGD with dilation. 4.  Discussed possible Botox therapy with Dr. Levon Hedger who recommends against this as he suspect other management options would suit this patient better such as POEM.  5.  Ultimately, once diet is advance, recommend staying on a full liquid diet only until definitive surgery. 6.  He will also need to stay upright at least 2 hours after eating once diet is advanced.  7.  Resume home diltiazem and oral PPI BID when diet is advance.      LOS: 0 days    11/05/2020, 12:26 PM   Ermalinda Memos, [facility]

## 2020-11-05 NOTE — Progress Notes (Signed)
Modified Barium Swallow Progress Note  Patient Details  Name: Christian Ellison MRN: [id number] Date of Birth: [birth year]  Today's Date: 11/05/2020  Modified Barium Swallow completed.  Full report located under Chart Review in the Imaging Section.  Brief recommendations include the following:  Clinical Impression  Pt presents with primary esophageal dysphagia which negatively impacts pharyngeal phase. Pt with premature spillage of liquids to the pyriforms with swallow initiation at the level of the pyriforms, no penetration or aspiration. Pt with trace pyriform residuals post swallow with liquids and puree. Pt with adequate hyolaryngeal excursion and tongue base retraction. He required two swallows to move the barium tablet taken with cup sip thin out of the oral cavity. Esophageal sweep revealed standing column of barium with bird peak appearance near GEJ even after thin liquids and puree only. Retention of food/barium increased throughout the study with eventual backflow of barium into the cervical esophagus. Pt has a follow up appointment in April per his report with the GI team at [facility] for consideration of possible Botox. Pt may be best to adhere to liquids only until he is seen. Pt is at risk for post prandial aspiration due to esophageal retention across textures and consistencies. Pt was encouraged to sit fully upright for all eating and drinking and remain upright for greater than 30 minutes post meals. He should also elevate the head of his bed (he only uses extra pillows at this time). SLP showed the imaging from the MBSS to Pt and reviewed esophageal swallow precautions. SLP will follow up x1 during acute stay to reinforce recommendations from today. MD, please see imaging attached in Image section.   Swallow Evaluation Recommendations   Recommended Consults: Consider esophageal assessment;Consider GI evaluation   SLP Diet Recommendations:  (consider liquids only)   Liquid  Administration via: Cup;Straw   Medication Administration: Whole meds with liquid   Supervision: Patient able to self feed       Postural Changes: Remain semi-upright after after feeds/meals (Comment);Seated upright at 90 degrees   Oral Care Recommendations: Oral care BID;Patient independent with oral care   Other Recommendations: Clarify dietary restrictions   Thank you,  Havery Moros, CCC-SLP [phone]  Lolah Coghlan 11/05/2020,4:20 PM

## 2020-11-05 NOTE — ED Notes (Signed)
Pt placed on 5L [place], now @94 %

## 2020-11-05 NOTE — ED Notes (Signed)
Called [facility] for abx  

## 2020-11-05 NOTE — ED Notes (Signed)
Date and time results received: 11/05/20 0423   Test: Lactic acid Critical Value: 2.8  Name of Provider Notified: Rancour  Orders Received? Or Actions Taken?: NA

## 2020-11-06 DIAGNOSIS — J690 Pneumonitis due to inhalation of food and vomit: Secondary | ICD-10-CM

## 2020-11-06 DIAGNOSIS — K224 Dyskinesia of esophagus: Secondary | ICD-10-CM

## 2020-11-06 LAB — CBC
HCT: 37 % — ABNORMAL LOW (ref 39.0–52.0)
Hemoglobin: 11.9 g/dL — ABNORMAL LOW (ref 13.0–17.0)
MCH: 29.5 pg (ref 26.0–34.0)
MCHC: 32.2 g/dL (ref 30.0–36.0)
MCV: 91.8 fL (ref 80.0–100.0)
Platelets: 124 10*3/uL — ABNORMAL LOW (ref 150–400)
RBC: 4.03 MIL/uL — ABNORMAL LOW (ref 4.22–5.81)
RDW: 13.5 % (ref 11.5–15.5)
WBC: 6.2 10*3/uL (ref 4.0–10.5)
nRBC: 0 % (ref 0.0–0.2)

## 2020-11-06 LAB — BASIC METABOLIC PANEL
Anion gap: 10 (ref 5–15)
BUN: 7 mg/dL (ref 6–20)
CO2: 27 mmol/L (ref 22–32)
Calcium: 8.6 mg/dL — ABNORMAL LOW (ref 8.9–10.3)
Chloride: 102 mmol/L (ref 98–111)
Creatinine, Ser: 0.77 mg/dL (ref 0.61–1.24)
GFR, Estimated: >60 mL/min (ref >60)
Glucose, Bld: 116 mg/dL — ABNORMAL HIGH (ref 70–99)
Potassium: 4.2 mmol/L (ref 3.5–5.1)
Sodium: 139 mmol/L (ref 135–145)

## 2020-11-06 LAB — URINE CULTURE: Culture: NO GROWTH

## 2020-11-06 LAB — PROTIME-INR
INR: 1.1 (ref 0.8–1.2)
Prothrombin Time: 13.3 seconds (ref 11.4–15.2)

## 2020-11-06 LAB — GLUCOSE, CAPILLARY: Glucose-Capillary: 114 mg/dL — ABNORMAL HIGH (ref 70–99)

## 2020-11-06 MED ORDER — ALBUTEROL SULFATE (2.5 MG/3ML) 0.083% IN NEBU
INHALATION_SOLUTION | RESPIRATORY_TRACT | Status: AC
  Administered 2020-11-06: 2.5 mg via RESPIRATORY_TRACT
  Filled 2020-11-06: qty 3

## 2020-11-06 MED ORDER — ALBUTEROL SULFATE (2.5 MG/3ML) 0.083% IN NEBU: 2.5000 mg | INHALATION_SOLUTION | Freq: Once | RESPIRATORY_TRACT | Status: AC

## 2020-11-06 NOTE — Progress Notes (Addendum)
  Speech Language Pathology Treatment: Dysphagia  Patient Details Name: Christian Ellison MRN: [id number] DOB: Apr 09, 1961 Today's Date: 11/06/2020 Time: 8502-7741 SLP Time Calculation (min) (ACUTE ONLY): 25 min  Assessment / Plan / Recommendation Clinical Impression  SLP visited Pt this AM for follow up education regarding MBSS completed yesterday. Pt sleepy this AM, however Estill Bamberg, spouse present. SLP reviewed imaging and recommendations with Pt/spouse. SLP provided written information regarding esophageal dysphagia precautions (sit fully upright for all po and remain up for greater than 30 minutes after, consider liquids only until seen by [facility] for follow up, elevate head of bed, consume frequent smaller meals, and avoid bending over/exercise after po). 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 Pt is at risk for post prandial aspiration due to the severity of esophageal dysphagia. SLP will sign off at this time and will route my report to his GI doctors at [facility]. Pt and spouse in agreement with plan of care.  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   HPI HPI: Christian Ellison is a 60 y.o. male with a history of recurrent aspiration pneumonia due to esophageal anatomical issue here with increased work of breathing and hypoxia with concern for aspiration again.  States he developed difficulty breathing over the past 2 days.  Wife checked his oxygen level at home and it was 68%.  Was 81% in triage.  He is on 6 L now at 96%.  He does feel better.  Does not wear oxygen at home. He has felt warm but denies any fever.  Denies any chest pain, abdominal pain.  Has had a few issues of vomiting. He underwent a barium swallow yesterday which showed Esophagus is negative for stricture, ulceration or  diverticula. Residual contrast visualized in the lower esophagus at 2 minutes and 5 minutes. No hiatal hernia is seen. Dysphagia evaluation requested.      SLP Plan  All goals met;Discharge SLP treatment due to (comment)       Recommendations  Diet recommendations: Dysphagia 3 (mechanical soft);Thin liquid (consider liquids only until seen by GI) Liquids provided via: Cup;Straw Medication Administration: Whole meds with liquid Supervision: Patient able to self feed Postural Changes and/or Swallow Maneuvers: Seated upright 90 degrees;Upright 30-60 min after meal;Out of bed for meals                Oral Care Recommendations: Oral care BID;Patient independent with oral care Follow up Recommendations: None SLP Visit Diagnosis: Dysphagia, pharyngoesophageal phase (R13.14) Plan: All goals met;Discharge SLP treatment due to (comment)       Thank you,  Genene Churn, [place]                 [place] 11/06/2020, 11:43 AM

## 2020-11-06 NOTE — Progress Notes (Signed)
PROGRESS NOTE  Christian Ellison DJM:[id number] DOB: August 15, 1961 DOA: 11/05/2020 PCP: Anabel Halon, MD  Brief History:  60 year old male with a history of esophageal dysmotility/esophageal stricture, recurrent aspiration pneumonia, hypothyroidism, anxiety, prior polysubstance abuse and recovered Hep C presenting with 1-day history of coughing hypoxia and shortness of breath.  The patient feels as if he is " regurgitating my food" again.  Reporting since yesterday he has difficulty breathing, his wife checked his pulse ox was as low as 68, 81 in triage in ED, on 6 L improved to 96%... He is not O2 dependent at home.  ED Course:   Vitals: Likely stable, temp 98.7, PR 107, RR 25, BP 124/71, satting 94% on 6 L of oxygen Abnormal labs; CBC CMP within normal limits, lactic acid 2.8, 2.5, WBC 8.3, CT Angiogram-negative for PE, positive for emphysema, multifocal pneumonia consistent with possible aspiration Chest x-ray clear  Assessment/Plan: Severe sepsis -Secondary to pneumonia -Present on admission -Presented with tachycardia, fever, and elevated lactic acid -Continue IV fluids -Continue IV Zosyn -Follow blood cultures -UA negative for pyuria  Aspiration pneumonia -CTA chest negative for PE; other findings as discussed above -Continue Zosyn  Acute respiratory failure with hypoxia -Secondary to pneumonia -stable on 15L>>8L -wean oxygen for saturation 90-92%  Esophageal dysmotility/stricture/Achalasia -GI consult appreciated-->no need for any urgent endoscopic therapy at the moment  -seen at [facility] on 10/18/2020 by Dr. Karie Georges and Pearletha Forge and he was set up to proceed with a repeat EGD with intraprocedural FLIP and Botox injection.  They also requested a repeat esophageal manometry.  He underwent yesterday time barium swallow that showed residual contrast in the lower esophagus at 2 and 5 minutes  -undergoing eval for POEM (per oral esophageal myotomy) at  [facility] -Continue famotidine -continue diltiazem   Anxiety/depression -Continue home dose alprazolam -PMP Aware reviewed--receives alprazolam 1 mg  #120 monthly  Hypothyroidism -Continue Synthroid  Recent left calcaneus/talus fracture -Status post ORIF 07/04/2020--Dr. Susa Simmonds      Status is: Inpatient  Remains inpatient appropriate because:IV treatments appropriate due to intensity of illness or inability to take PO   Dispo: The patient is from: Home              Anticipated d/c is to: Home              Patient currently is not medically stable to d/c.   Difficult to place patient No        Family Communication:   Spouse updated 3/23  Consultants:  GI  Code Status:  FULL   DVT Prophylaxis:  [place] Heparin   Procedures: As Listed in Progress Note Above  Antibiotics: Zosyn 3/22      Subjective: Patient denies fevers, chills, headache, chest pain, dyspnea, nausea, vomiting, diarrhea, abdominal pain, dysuria, hematuria, hematochezia, and melena.   Objective: Vitals:   11/06/20 0442 11/06/20 0743 11/06/20 0916 11/06/20 1435  BP: (!) 130/59  119/78 121/73  Pulse: 84   64  Resp: 19     Temp: 98.2 F (36.8 C)   98.3 F (36.8 C)  TempSrc:    Oral  SpO2: (!) 89% 91%  97%  Weight:      Height:        Intake/Output Summary (Last 24 hours) at 11/06/2020 1528 Last data filed at 11/06/2020 0914 Gross per 24 hour  Intake 1925.23 ml  Output 825 ml  Net 1100.23 ml   Weight change:  Exam:  General:  Pt is alert, follows commands appropriately, not in acute distress  HEENT: No icterus, No thrush, No neck mass, [place]/AT  Cardiovascular: RRR, S1/S2, no rubs, no gallops  Respiratory: bibasilar rales. No wheeze  Abdomen: Soft/+BS, non tender, non distended, no guarding  Extremities: No edema, No lymphangitis, No petechiae, No rashes, no synovitis   Data Reviewed: I have personally reviewed following labs and imaging studies Basic Metabolic  Panel: Recent Labs  Lab 11/05/20 0311 11/06/20 0629  NA 140 139  K 3.5 4.2  CL 102 102  CO2 29 27  GLUCOSE 107* 116*  BUN 8 7  CREATININE 0.84 0.77  CALCIUM 8.7* 8.6*   Liver Function Tests: Recent Labs  Lab 11/05/20 0311  AST 19  ALT 17  ALKPHOS 71  BILITOT 0.3  PROT 7.8  ALBUMIN 4.3   No results for input(s): LIPASE, AMYLASE in the last 168 hours. No results for input(s): AMMONIA in the last 168 hours. Coagulation Profile: Recent Labs  Lab 11/05/20 0311 11/06/20 0804  INR 0.9 1.1   CBC: Recent Labs  Lab 11/05/20 0311 11/06/20 1344  WBC 8.3 6.2  NEUTROABS 5.6  --   HGB 13.8 11.9*  HCT 43.1 37.0*  MCV 91.3 91.8  PLT 173 124*   Cardiac Enzymes: No results for input(s): CKTOTAL, CKMB, CKMBINDEX, TROPONINI in the last 168 hours. BNP: Invalid input(s): POCBNP CBG: Recent Labs  Lab 11/05/20 0843 11/06/20 0735  GLUCAP 115* 114*   HbA1C: No results for input(s): HGBA1C in the last 72 hours. Urine analysis:    Component Value Date/Time   COLORURINE YELLOW 11/05/2020 0730   APPEARANCEUR CLEAR 11/05/2020 0730   LABSPEC 1.012 11/05/2020 0730   PHURINE 5.0 11/05/2020 0730   GLUCOSEU NEGATIVE 11/05/2020 0730   HGBUR SMALL (A) 11/05/2020 0730   BILIRUBINUR NEGATIVE 11/05/2020 0730   KETONESUR NEGATIVE 11/05/2020 0730   PROTEINUR NEGATIVE 11/05/2020 0730   NITRITE NEGATIVE 11/05/2020 0730   LEUKOCYTESUR NEGATIVE 11/05/2020 0730   Sepsis Labs: (procalcitonin:4,lacticidven:4) ) Recent Results (from the past 240 hour(s))  Blood Culture (routine x 2)     Status: None (Preliminary result)   Collection Time: 11/05/20  3:11 AM   Specimen: BLOOD  Result Value Ref Range Status   Specimen Description BLOOD LEFT ANTECUBITAL  Final   Special Requests   Final    BOTTLES DRAWN AEROBIC AND ANAEROBIC Blood Culture adequate volume   Culture   Final    NO GROWTH 1 DAY Performed at [facility], [address], [place], [place] [address]    Report  Status PENDING  Incomplete  Resp Panel by RT-PCR (Flu A&B, Covid) Nasopharyngeal Swab     Status: None   Collection Time: 11/05/20  3:16 AM   Specimen: Nasopharyngeal Swab; Nasopharyngeal(NP) swabs in vial transport medium  Result Value Ref Range Status   SARS Coronavirus 2 by RT PCR NEGATIVE NEGATIVE Final    Comment: (NOTE) SARS-CoV-2 target nucleic acids are NOT DETECTED.  The SARS-CoV-2 RNA is generally detectable in upper respiratory specimens during the acute phase of infection. The lowest concentration of SARS-CoV-2 viral copies this assay can detect is 138 copies/mL. A negative result does not preclude SARS-Cov-2 infection and should not be used as the sole basis for treatment or other patient management decisions. A negative result may occur with  improper specimen collection/handling, submission of specimen other than nasopharyngeal swab, presence of viral mutation(s) within the areas targeted by this assay, and inadequate number of viral copies(<138 copies/mL). A  negative result must be combined with clinical observations, patient history, and epidemiological information. The expected result is Negative.  Fact Sheet for Patients:  BloggerCourse.com  Fact Sheet for Healthcare Providers:  SeriousBroker.it  This test is no t yet approved or cleared by the [place] FDA and  has been authorized for detection and/or diagnosis of SARS-CoV-2 by FDA under an Emergency Use Authorization (EUA). This EUA will remain  in effect (meaning this test can be used) for the duration of the COVID-19 declaration under Section 564(b)(1) of the Act, 21 U.S.C.section 360bbb-3(b)(1), unless the authorization is terminated  or revoked sooner.       Influenza A by PCR NEGATIVE NEGATIVE Final   Influenza B by PCR NEGATIVE NEGATIVE Final    Comment: (NOTE) The Xpert Xpress SARS-CoV-2/FLU/RSV plus assay is intended as an aid in the diagnosis  of influenza from Nasopharyngeal swab specimens and should not be used as a sole basis for treatment. Nasal washings and aspirates are unacceptable for Xpert Xpress SARS-CoV-2/FLU/RSV testing.  Fact Sheet for Patients: BloggerCourse.com  Fact Sheet for Healthcare Providers: SeriousBroker.it  This test is not yet approved or cleared by the [place] FDA and has been authorized for detection and/or diagnosis of SARS-CoV-2 by FDA under an Emergency Use Authorization (EUA). This EUA will remain in effect (meaning this test can be used) for the duration of the COVID-19 declaration under Section 564(b)(1) of the Act, 21 U.S.C. section 360bbb-3(b)(1), unless the authorization is terminated or revoked.  Performed at [facility], [address], [place], [place] [address]   Blood Culture (routine x 2)     Status: None (Preliminary result)   Collection Time: 11/05/20  5:02 AM   Specimen: BLOOD  Result Value Ref Range Status   Specimen Description BLOOD BLOOD RIGHT HAND  Final   Special Requests   Final    Blood Culture adequate volume BOTTLES DRAWN AEROBIC AND ANAEROBIC   Culture   Final    NO GROWTH 1 DAY Performed at [facility], [address], [place], [place] [address]    Report Status PENDING  Incomplete  Urine culture     Status: None   Collection Time: 11/05/20  7:30 AM   Specimen: Urine, Random  Result Value Ref Range Status   Specimen Description   Final    URINE, RANDOM Performed at [facility], [address], [place], [place] [address]    Special Requests   Final    NONE Performed at [facility], [address], [place], [place] [address]    Culture   Final    NO GROWTH Performed at [facility], [address], [place], [place] [address]    Report Status 11/06/2020 FINAL  Final  MRSA PCR Screening     Status: None   Collection Time: 11/05/20  1:45 PM   Specimen: Nasal Mucosa; Nasopharyngeal  Result  Value Ref Range Status   MRSA by PCR NEGATIVE NEGATIVE Final    Comment:        The GeneXpert MRSA Assay (FDA approved for NASAL specimens only), is one component of a comprehensive MRSA colonization surveillance program. It is not intended to diagnose MRSA infection nor to guide or monitor treatment for MRSA infections. Performed at [facility], [address], [place], [place] [address]      Scheduled Meds: . aspirin EC  81 mg Oral QHS  . chlorhexidine  15 mL Mouth Rinse BID  . diltiazem  120 mg Oral Daily  .  famotidine  40 mg Oral Daily  . heparin  5,000 Units Subcutaneous Q8H  . levothyroxine  25 mcg Oral Daily  . mouth rinse  15 mL Mouth Rinse BID  . mouth rinse  15 mL Mouth Rinse q12n4p  . mouth rinse  15 mL Mouth Rinse q12n4p  . sodium chloride flush  3 mL Intravenous Q12H  . sodium chloride flush  3 mL Intravenous Q12H   Continuous Infusions: . sodium chloride    . piperacillin-tazobactam (ZOSYN)  IV 3.375 g (11/06/20 0617)    Procedures/Studies: DG Chest 2 View  Result Date: 10/24/2020 CLINICAL DATA:  Concern for aspiration. History of aspiration pneumonia due to esophageal defect. EXAM: CHEST - 2 VIEW COMPARISON:  Most recent radiograph 10/10/2020.  Chest CT 08/05/2020 FINDINGS: Improved patchy bilateral airspace disease from prior exam, mild residual. No new or progressive consolidation. The heart is normal in size with normal mediastinal contours. No pleural fluid or pneumothorax. Chronic anterior wedging of vertebra at the thoracolumbar junction. IMPRESSION: Improved bilateral patchy airspace disease from prior exam, mild residual. Electronically Signed   By: Narda Rutherford M.D.   On: 10/24/2020 19:44   CT Angio Chest PE W and/or Wo Contrast  Result Date: 11/05/2020 CLINICAL DATA:  Extensive history of aspiration pneumonia EXAM: CT ANGIOGRAPHY CHEST WITH CONTRAST TECHNIQUE: Multidetector CT imaging of the chest was performed using the standard protocol during  bolus administration of intravenous contrast. Multiplanar CT image reconstructions and MIPs were obtained to evaluate the vascular anatomy. CONTRAST:  OMNIPAQUE IOHEXOL 350 MG/ML SOLN COMPARISON:  Radiograph 11/05/2020, CT angiography 08/05/2020 FINDINGS: Cardiovascular: Satisfactory opacification the pulmonary arteries to the segmental level. No pulmonary artery filling defects are identified. Central pulmonary arteries are normal caliber. Normal heart size. No pericardial effusion. The aortic root is suboptimally assessed given cardiac pulsation artifact. The aorta is normal caliber. No acute luminal abnormality of the imaged aorta. No periaortic stranding or hemorrhage. Shared origin of the brachiocephalic and left common carotid arteries. Proximal great vessels are free of acute abnormality. No major venous abnormalities are seen. Mediastinum/Nodes: Stable appearance of a 1.5 cm right hilar lymph node, not significantly changed from most recent comparison (4/44). Additional stable right infrahilar lymph node measuring 10 mm (4/52). No new or enlarged adenopathy is seen. Several additional scattered subcentimeter mediastinal and hilar nodes are present, favored to be reactive. Patulous, fluid-filled thoracic esophagus to the level of the thoracic inlet. Posterior bowing of the trachea compatible with imaging during exhalation. Mild central airways thickening, as described below. Thyroid gland and thoracic inlet are unremarkable. Lungs/Pleura: Diffusely thickened central airways with scattered secretions in several more fluid-filled airways particularly in the right middle and lower lobe. Multifocal areas of mixed reticulonodular and patchy ground-glass opacities throughout both lungs, such an appearance and distribution is quite similar to the comparison exam. No pneumothorax. No visible layering pleural effusion. Mild paraseptal emphysematous changes. Some interlobular septal thickening and vascular  redistribution could reflect mild superimposed interstitial edematous change. Upper Abdomen: Heterogeneously enhancing spleen is likely related to contrast timing. No acute abnormalities present in the visualized portions of the upper abdomen. Minimal upper abdominal atherosclerosis. Musculoskeletal: Degenerative changes are present in the imaged spine and shoulders. No acute osseous abnormality or suspicious osseous lesion. No worrisome chest wall masses or lesions. Review of the MIP images confirms the above findings. IMPRESSION: 1. No evidence of acute pulmonary artery filling defects. 2. Multifocal areas of mixed reticulonodular and patchy ground-glass opacities throughout both lungs, on a background of  diffusely fluid-filled and thickened airways. Appearance could reflect atypical infection or sequela of chronic and ongoing aspiration particularly given the presence of the patulous fluid-filled thoracic esophagus. 3. Some interlobular septal thickening and vascular redistribution could reflect mild superimposed interstitial edematous change. 4. Stable appearance of enlarged right hilar lymph nodes, likely reactive and or edematous. 5. Emphysema (ICD10-J43.9). 6.  Aortic Atherosclerosis (ICD10-I70.0). Electronically Signed   By: Kreg Shropshire M.D.   On: 11/05/2020 05:39   DG Chest Portable 1 View  Result Date: 11/05/2020 CLINICAL DATA:  Hypoxia EXAM: PORTABLE CHEST 1 VIEW COMPARISON:  10/24/2020 FINDINGS: The heart size and mediastinal contours are within normal limits. Both lungs are clear. The visualized skeletal structures are unremarkable. IMPRESSION: No active disease. Electronically Signed   By: Deatra Robinson M.D.   On: 11/05/2020 03:28   DG Chest Port 1 View  Result Date: 10/10/2020 CLINICAL DATA:  Hypoxia.  Respiratory failure. EXAM: PORTABLE CHEST 1 VIEW COMPARISON:  07/08/2021. FINDINGS: Heart size normal. Persistent right mid lung and base infiltrate most consistent with pneumonia. No interim  change. No pleural effusion or pneumothorax. Degenerative change thoracic spine. IMPRESSION: Persistent right mid lung and base infiltrate most consistent with pneumonia. No interim change. Electronically Signed   By: Maisie Fus  Register   On: 10/10/2020 11:24   DG Chest Port 1 View  Result Date: 10/08/2020 CLINICAL DATA:  Lethargic and confuse, concern for sepsis EXAM: PORTABLE CHEST 1 VIEW COMPARISON:  Chest radiograph September 12, 2020 FINDINGS: The heart size and mediastinal contours are unchanged. Patchy right basilar airspace opacities. No significant pleural effusion or visible pneumothorax. The visualized skeletal structures are unchanged. IMPRESSION: Patchy right basilar airspace opacities favor infection. Electronically Signed   By: Maudry Mayhew MD   On: 10/08/2020 13:21   DG Swallowing Func-Speech Pathology  Result Date: 11/05/2020 Objective Swallowing Evaluation: Type of Study: MBS-Modified Barium Swallow Study  Patient Details Name: ANNIE ROSEBOOM MRN: [id number] Date of Birth: [birth year] Today's Date: 11/05/2020 Time: SLP Start Time (ACUTE ONLY): 1235 -SLP Stop Time (ACUTE ONLY): 1305 SLP Time Calculation (min) (ACUTE ONLY): 30 min Past Medical History: Past Medical History: Diagnosis Date . Anxiety  . Aspiration pneumonia (HCC) 05/26/2018  RLL . Chronic pain  . Closed fracture of xiphoid process  . Esophageal stricture  . GERD (gastroesophageal reflux disease)  . 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  Marland Kitchen Opiate abuse, continuous (HCC)  . Pulmonary edema 02/02/2018 . Sepsis due to pneumonia (HCC) 03/11/2019 . Sleep apnea   CPAP nightly Past Surgical History: Past Surgical History: Procedure Laterality Date . BIOPSY  03/18/2018  Procedure: BIOPSY;  Surgeon: Malissa Hippo, MD;  Location: [facility];  Service: Endoscopy;;  gastric  . COLONOSCOPY N/A 11/22/2013  Dr. Karilyn Cota: Normal except for hemorrhoids.  Next colonoscopy 10 years. . ESOPHAGEAL DILATION N/A 03/18/2018  Procedure: ESOPHAGEAL  DILATION;  Surgeon: Malissa Hippo, MD;  Location: [facility];  Service: Endoscopy;  Laterality: N/A; . ESOPHAGEAL DILATION N/A 08/07/2020  Procedure: ESOPHAGEAL DILATION;  Surgeon: Malissa Hippo, MD;  Location: [facility];  Service: Endoscopy;  Laterality: N/A; . ESOPHAGEAL MANOMETRY N/A 06/15/2018  Procedure: ESOPHAGEAL MANOMETRY (EM);  Surgeon: Napoleon Form, MD;  Location: [facility];  Service: Endoscopy;  Laterality: N/A; . ESOPHAGOGASTRODUODENOSCOPY (EGD) WITH PROPOFOL N/A 03/18/2018  Dr. Karilyn Cota: Benign-appearing mild esophageal stenosis proximal to the GE J, status post dilation.  2 cm hiatal hernia.  Gastritis, biopsies negative for H. pylori . ESOPHAGOGASTRODUODENOSCOPY (EGD) WITH PROPOFOL  N/A 08/07/2020  Procedure: ESOPHAGOGASTRODUODENOSCOPY (EGD) WITH PROPOFOL;  Surgeon: Malissa Hippoehman, Najeeb U, MD;  Location: [facility];  Service: Endoscopy;  Laterality: N/A; . Fracture Right Leg    Patient has rod/screws in this leg . ORIF CALCANEOUS FRACTURE Left 07/04/2020  Procedure: OPEN REDUCTION INTERNAL FIXATION (ORIF) LEFT TALUS AND CALCANEOUS FRACTURE, SUBTALAR JOINT ARTHROTOMY WITH REMOVAL OF LOOSE BODIES, TALONAVICULAR JOINT ARTHOTOMY WITH REMOVAL OF LOOSE BODIES;  Surgeon: Terance HartAdair, Christopher R, MD;  Location: [facility];  Service: Orthopedics;  Laterality: Left; . Rotor Cuff  2012  Right Shoulder HPI: Christian Ellison is a 60 y.o. male with a history of recurrent aspiration pneumonia due to esophageal anatomical issue here with increased work of breathing and hypoxia with concern for aspiration again.  States he developed difficulty breathing over the past 2 days.  Wife checked his oxygen level at home and it was 68%.  Was 81% in triage.  He is on 6 L now at 96%.  He does feel better.  Does not wear oxygen at home. He has felt warm but denies any fever.  Denies any chest pain, abdominal pain.  Has had a few issues of vomiting. He underwent a barium swallow yesterday which showed Esophagus is  negative for stricture, ulceration or diverticula. Residual contrast visualized in the lower esophagus at 2 minutes and 5 minutes. No hiatal hernia is seen. Dysphagia evaluation requested.  Subjective: "I don't know why we are doing this." Assessment / Plan / Recommendation CHL IP CLINICAL IMPRESSIONS 11/05/2020 Clinical Impression Pt presents with primary esophageal dysphagia which negatively impacts pharyngeal phase. Pt with premature spillage of liquids to the pyriforms with swallow initiation at the level of the pyriforms, no penetration or aspiration. Pt with trace pyriform residuals post swallow with liquids and puree. Pt with adequate hyolaryngeal excursion and tongue base retraction. He required two swallows to move the barium tablet taken with cup sip thin out of the oral cavity. Esophageal sweep revealed standing column of barium with bird peak appearance near GEJ even after thin liquids and puree only. Retention of food/barium increased throughout the study with eventual backflow of barium into the cervical esophagus. Pt has a follow up appointment in April per [facility] of possible Botox. Pt may be best to adhere to liquids only until he is seen. Pt is at risk for post prandial aspiration due to esophageal retention across textures and consistencies. Pt was encouraged to sit fully upright for all eating and drinking and remain upright for greater than 30 minutes post meals. He should also elevate the head of his bed (he only uses extra pillows at this time). SLP showed the imaging from the MBSS to Pt and reviewed esophageal swallow precautions. SLP will follow up x1 during acute stay to reinforce recommendations from today. MD, please see imaging attached in Image section. SLP Visit Diagnosis Dysphagia, pharyngoesophageal phase (R13.14) Attention and concentration deficit following -- Frontal lobe and executive function deficit following -- Impact on safety and  function Moderate aspiration risk   CHL IP TREATMENT RECOMMENDATION 11/05/2020 Treatment Recommendations Therapy as outlined in treatment plan below   Prognosis 11/05/2020 Prognosis for Safe Diet Advancement Guarded Barriers to Reach Goals (No Data) Barriers/Prognosis Comment -- CHL IP DIET RECOMMENDATION 11/05/2020 SLP Diet Recommendations (No Data) Liquid Administration via Cup;Straw Medication Administration Whole meds with liquid Compensations -- Postural Changes Remain semi-upright after after feeds/meals (Comment);Seated upright at 90 degrees   CHL IP OTHER RECOMMENDATIONS  11/05/2020 Recommended Consults Consider esophageal assessment;Consider GI evaluation Oral Care Recommendations Oral care BID;Patient independent with oral care Other Recommendations Clarify dietary restrictions   CHL IP FOLLOW UP RECOMMENDATIONS 11/05/2020 Follow up Recommendations None   CHL IP FREQUENCY AND DURATION 11/05/2020 Speech Therapy Frequency (ACUTE ONLY) min 1 x/week Treatment Duration 1 week      CHL IP ORAL PHASE 11/05/2020 Oral Phase Impaired Oral - Pudding Teaspoon -- Oral - Pudding Cup -- Oral - Honey Teaspoon -- Oral - Honey Cup -- Oral - Nectar Teaspoon -- Oral - Nectar Cup -- Oral - Nectar Straw -- Oral - Thin Teaspoon Premature spillage Oral - Thin Cup Premature spillage Oral - Thin Straw -- Oral - Puree -- Oral - Mech Soft -- Oral - Regular -- Oral - Multi-Consistency -- Oral - Pill -- Oral Phase - Comment --  CHL IP PHARYNGEAL PHASE 11/05/2020 Pharyngeal Phase Impaired Pharyngeal- Pudding Teaspoon -- Pharyngeal -- Pharyngeal- Pudding Cup -- Pharyngeal -- Pharyngeal- Honey Teaspoon -- Pharyngeal -- Pharyngeal- Honey Cup -- Pharyngeal -- Pharyngeal- Nectar Teaspoon -- Pharyngeal -- Pharyngeal- Nectar Cup -- Pharyngeal -- Pharyngeal- Nectar Straw -- Pharyngeal -- Pharyngeal- Thin Teaspoon Delayed swallow initiation-pyriform sinuses;Pharyngeal residue - pyriform Pharyngeal -- Pharyngeal- Thin Cup Delayed swallow  initiation-pyriform sinuses;Pharyngeal residue - pyriform Pharyngeal -- Pharyngeal- Thin Straw Pharyngeal residue - pyriform Pharyngeal -- Pharyngeal- Puree Delayed swallow initiation-vallecula;Pharyngeal residue - pyriform Pharyngeal -- Pharyngeal- Mechanical Soft -- Pharyngeal -- Pharyngeal- Regular Delayed swallow initiation-vallecula Pharyngeal -- Pharyngeal- Multi-consistency -- Pharyngeal -- Pharyngeal- Pill WFL Pharyngeal -- Pharyngeal Comment --  CHL IP CERVICAL ESOPHAGEAL PHASE 11/05/2020 Cervical Esophageal Phase Impaired Pudding Teaspoon -- Pudding Cup -- Honey Teaspoon -- Honey Cup -- Nectar Teaspoon -- Nectar Cup -- Nectar Straw -- Thin Teaspoon -- Thin Cup -- Thin Straw -- Puree Esophageal backflow into cervical esophagus Mechanical Soft -- Regular -- Multi-consistency -- Pill -- Cervical Esophageal Comment full standing column of barium throughout the study with bird peak appearance near GEJ, barium backflow to the UES but did not go into pharynx Thank you, Havery Moros, CCC-SLP [phone] PORTER,DABNEY 11/05/2020, 5:01 PM               Catarina Hartshorn, DO  [facility]  If 7PM-7AM, please contact night-coverage [email or web address] Password [email or web address] 11/06/2020, 3:28 PM   LOS: 1 day

## 2020-11-06 NOTE — Evaluation (Signed)
Occupational Therapy Evaluation Patient Details Name: Christian Ellison MRN: [id number] DOB: 01-May-1961 Today's Date: 11/06/2020    History of Present Illness Christian Ellison is a 60 y.o. male with medical history significant of dysphagia, esophageal dysmotility disorder, GERD, chronic pain, esophageal stricture, hepatitis C, HTN,?  CHF, OSA, anxiety/depression, hypothyroidism.... Presented with shortness of breath, hypoxia.     Patient reports that he has a dysphagia, esophageal dysmotility, anatomical issue which recently been looked up at [facility].  But the result has not been discussed with the him.  Reporting since yesterday he has difficulty breathing, his wife checked his pulse ox was as low as 68, 81 in triage in ED, on 6 L improved to 96%... He is not O2 dependent at home.     Patient Denies having: Fever, Chills, Chest Pain, Abd pain, N/V/D, headache, dizziness, lightheadedness,  Dysuria, Joint pain, rash, open wounds   Clinical Impression    Pt mildly agitated at times but agreeable to OT evaluation. Pt was able to complete bed mobility and functional ambulation to sink donning 15 LPM O2 via nasal cannula with modified independence. Pt SpO2 levels dropped to 87 when standing without O2 as part of functional assessment. Able to complete grooming at sink to brush teeth with modified independence. Pt not recommended for further hospital OT. Nurse notified of O2 levels during session.     Follow Up Recommendations  No OT follow up    Equipment Recommendations  Other (comment) (Possible O2 via nasal cannula at home but pt not very open to this.)           Precautions / Restrictions Precautions Precautions: Fall Restrictions Weight Bearing Restrictions: No      Mobility Bed Mobility Overal bed mobility: Modified Independent (bed inclined)             General bed mobility comments: inclined in bed to sit and back    Transfers Overall transfer level: Modified independent                General transfer comment: Donning 15 LPM O2 when ambulating to sink and brushing teeth. Able to stand without O2 with SpO2 levels decreasing to ~87% prior to sitting and donning O2 again and returning to ~93%    Balance Overall balance assessment: Independent                                         ADL either performed or assessed with clinical judgement   ADL Overall ADL's : Modified independent                                       General ADL Comments: Able to stand and walk to sink to complete tooth brushing with assist only to manage portable IV.     Vision Baseline Vision/History: No visual deficits                  Pertinent Vitals/Pain Pain Assessment: No/denies pain     Hand Dominance Right   Extremity/Trunk Assessment Upper Extremity Assessment Upper Extremity Assessment: Overall WFL for tasks assessed   Lower Extremity Assessment Lower Extremity Assessment: Overall WFL for tasks assessed   Cervical / Trunk Assessment Cervical / Trunk Assessment: Normal   Communication Communication Communication: No difficulties   Cognition Arousal/Alertness: Awake/alert  Behavior During Therapy: Agitated Overall Cognitive Status: Within Functional Limits for tasks assessed                                 General Comments: Slightly agitated at times.                    Home Living Family/patient expects to be discharged to:: Private residence Living Arrangements: Spouse/significant other Available Help at Discharge: Family;Available 24 hours/day Type of Home: House Home Access: Stairs to enter [employer] of Steps: 2 Entrance Stairs-Rails: None (Grabs columns located near steps) Home Layout: One level     Bathroom Shower/Tub: Chief Strategy Officer: Handicapped height Bathroom Accessibility: No   Home Equipment: Environmental consultant - 2 wheels;Tub bench;Grab bars - tub/shower           Prior Functioning/Environment Level of Independence: Independent        Comments: independent ambulator; drives                      OT Goals(Current goals can be found in the care plan section) Acute Rehab OT Goals Patient Stated Goal: return home  OT Frequency:         End of Session Equipment Utilized During Treatment: Oxygen (15 LPM O2 intermittently) Nurse Communication: Other (comment) (Nurse notified of drop of SpO2 levels when standing without O2 via nasal cannula.)  Activity Tolerance: Patient tolerated treatment well Patient left: in bed;with call bell/phone within reach;with family/visitor present  OT Visit Diagnosis: Unsteadiness on feet (R26.81)                Time: 8208-1388 OT Time Calculation (min): 31 min Charges:  OT General Charges $OT Visit: 1 Visit OT Evaluation $OT Eval Low Complexity: 1 Low  Jameeka Marcy OT, MOT   Danie Chandler 11/06/2020, 12:05 PM

## 2020-11-06 NOTE — TOC Initial Note (Addendum)
Transition of Care [facility]) - Initial/Assessment Note    Patient Details  Name: Christian Ellison MRN: [id number] Date of Birth: 1961/05/10  Transition of [facility]) CM/SW Contact:    Karn Cassis, LCSW Phone Number: 11/06/2020, 8:06 AM  Clinical Narrative:  Pt admitted due to acute respiratory failure with hypoxia. Pt known to TOC due to frequent admissions (6 admissions in last 6 months). Assessment completed due to high risk readmission score. LCSW completed assessment with pt's wife as pt was unavailable. Pt's wife states pt is independent with ADLs. She assists with medications. He is currently pending disability and should have determination soon. Pt plans to return home when medically stable. TOC received consult for PCP/Home health/DME. Pt's PCP is Dr. Allena Katz per wife. No home health or DME needs reported at this time. Pt on HFNC. TOC will monitor for home O2. TOC will continue to follow.                  Expected Discharge Plan: Home/Self Care Barriers to Discharge: Continued Medical Work up   Patient Goals and CMS Choice Patient states their goals for this hospitalization and ongoing recovery are:: return home   Choice offered to / list presented to : Patient  Expected Discharge Plan and Services Expected Discharge Plan: Home/Self Care In-house Referral: Clinical Social Work   Post Acute Care Choice: NA Living arrangements for the past 2 months: Single Family Home                 DME Arranged: N/A DME Agency: NA                  Prior Living Arrangements/Services Living arrangements for the past 2 months: Single Family Home Lives with:: Spouse Patient language and need for interpreter reviewed:: Yes Do you feel safe going back to the place where you live?: Yes      Need for Family Participation in Patient Care: No (Comment)   Current home services: DME (wheelchair, cane) Criminal Activity/Legal Involvement Pertinent to Current  Situation/Hospitalization: No - Comment as needed  Activities of Daily Living      Permission Sought/Granted                  Emotional Assessment         Alcohol / Substance Use: Not Applicable Psych Involvement: No (comment)  Admission diagnosis:  Hypoxia [R09.02] Acute respiratory failure with hypoxia (HCC) [J96.01] Aspiration pneumonia of both lungs due to regurgitated food, unspecified part of lung (HCC) [J69.0] Patient Active Problem List   Diagnosis Date Noted  . Acute respiratory failure with hypoxia (HCC) 11/05/2020  . Hypoxia 11/05/2020  . Pneumonia 10/09/2020  . GERD (gastroesophageal reflux disease)   . Esophageal motility disorder   . Alcohol abuse, in remission 08/30/2020  . Gastroesophageal reflux disease with esophagitis 08/30/2020  . Moderate recurrent major depression (HCC) 08/30/2020  . OSA on CPAP 08/30/2020  . Anxiety 06/12/2020  . Benzodiazepine dependence (HCC) 06/12/2020  . History of substance abuse (HCC) 06/12/2020  . Hypothyroidism 03/11/2019  . Recurrent aspiration pneumonia (HCC)   . Essential hypertension 02/02/2018  . CHF (congestive heart failure) (HCC) 02/02/2018  . Dysphagia 02/02/2018  . Esophageal stricture/stenosis/esophageal dysmotility with recurrent aspiration 02/02/2018  . Esophageal stenosis 02/02/2018  . Hx of hepatitis C 01/17/2013  . Depression 01/17/2013  . Anxiety with depression 01/17/2013   PCP:  Anabel Halon, MD Pharmacy:   [facility] [phone] - [place], [place] - [address] [place] [address] Phone: [phone] Fax: [phone]     Social Determinants of Health (SDOH) Interventions    Readmission Risk Interventions Readmission Risk Prevention Plan 11/06/2020 10/11/2020 08/06/2020  Transportation Screening Complete Complete Complete  PCP or Specialist Appt within 3-5 Days - - -  HRI or Home Care Consult - - Complete  Social Work Consult for  Recovery Care Planning/Counseling - - Complete  Palliative Care Screening - - Not Applicable  Medication Review Oceanographer) Complete Complete Complete  PCP or Specialist appointment within 3-5 days of discharge - Complete -  PCP/Specialist Appt Not Complete comments - - -  HRI or Home Care Consult Complete Not Complete -  HRI or Home Care Consult Pt Refusal Comments - no barriers -  SW Recovery Care/Counseling Consult Complete Not Complete -  SW Consult Not Complete Comments - no barriers -  Palliative Care Screening Not Applicable Not Applicable -  Skilled Nursing Facility Not Applicable Not Applicable -  Some recent data might be hidden

## 2020-11-07 LAB — BASIC METABOLIC PANEL
Anion gap: 7 (ref 5–15)
BUN: 8 mg/dL (ref 6–20)
CO2: 32 mmol/L (ref 22–32)
Calcium: 8.6 mg/dL — ABNORMAL LOW (ref 8.9–10.3)
Chloride: 100 mmol/L (ref 98–111)
Creatinine, Ser: 0.84 mg/dL (ref 0.61–1.24)
GFR, Estimated: >60 mL/min (ref >60)
Glucose, Bld: 117 mg/dL — ABNORMAL HIGH (ref 70–99)
Potassium: 3.9 mmol/L (ref 3.5–5.1)
Sodium: 139 mmol/L (ref 135–145)

## 2020-11-07 LAB — CBC
HCT: 36.6 % — ABNORMAL LOW (ref 39.0–52.0)
Hemoglobin: 11.5 g/dL — ABNORMAL LOW (ref 13.0–17.0)
MCH: 28.6 pg (ref 26.0–34.0)
MCHC: 31.4 g/dL (ref 30.0–36.0)
MCV: 91 fL (ref 80.0–100.0)
Platelets: 130 10*3/uL — ABNORMAL LOW (ref 150–400)
RBC: 4.02 MIL/uL — ABNORMAL LOW (ref 4.22–5.81)
RDW: 13.1 % (ref 11.5–15.5)
WBC: 4.5 10*3/uL (ref 4.0–10.5)
nRBC: 0 % (ref 0.0–0.2)

## 2020-11-07 LAB — GLUCOSE, CAPILLARY: Glucose-Capillary: 125 mg/dL — ABNORMAL HIGH (ref 70–99)

## 2020-11-07 LAB — PROCALCITONIN: Procalcitonin: 0.2 ng/mL

## 2020-11-07 LAB — MAGNESIUM: Magnesium: 2.1 mg/dL (ref 1.7–2.4)

## 2020-11-07 NOTE — Progress Notes (Signed)
PROGRESS NOTE  Christian Ellison LFY:[id number] DOB: 1961-01-08 DOA: 11/05/2020 PCP: Anabel Halon, MD  Brief History:  60 year old male with a history of esophageal dysmotility/esophageal stricture, recurrent aspiration pneumonia, hypothyroidism, anxiety, prior polysubstance abuse and recovered Hep C presenting with 1-day history of coughing hypoxia and shortness of breath. The patient feels as if he is "regurgitating my food"again.  Reporting since yesterday he has difficulty breathing, his wife checked his pulse ox was as low as 68, 81 in triage in ED, on 6 L improved to 96%.Marland KitchenMarland KitchenHe is not O2 dependent at home.  ED Course:  Vitals:Likely stable, temp 98.7, PR 107, RR 25, BP 124/71, satting 94% on 6 L of oxygen Abnormal labs;CBC CMP within normal limits, lactic acid 2.8, 2.5, WBC 8.3, CTAngiogram-negative for PE, positive for emphysema, multifocal reticulonodular & patchy GGO bilateral on a background of fluid filled thickened airways.    Assessment/Plan: Severe sepsis -Secondary to pneumonia -Present on admission -Presented with tachycardia, fever, and elevated lactic acid -Continue IV fluids -Continue IV Zosyn -Follow blood cultures -UA negative for pyuria  Aspiration pneumonia -CTA chest negative for PE;other findings as discussed above -Continue Zosyn  Acute respiratory failure with hypoxia -Secondary to pneumonia -stable on15L>>8L>>5L -wean oxygen for saturation 90-92%  Esophageal dysmotility/stricture/Achalasia -GI consult appreciated-->no need for any urgent endoscopic therapy at the moment -seen at [facility] on 10/18/2020 by Dr. Karie Georges and Pearletha Forge and he was set up to proceed with a repeat EGD with intraprocedural FLIP and Botox injection. They also requested a repeat esophageal manometry. He underwent yesterday time barium swallow that showed residual contrast in the lower esophagus at 2 and 5 minutes -undergoing eval for POEM (per oral  esophageal myotomy) at [facility] -Continue famotidine -continue diltiazem   Anxiety/depression -Continue home dose alprazolam -PMP Aware reviewed--receives alprazolam 1 mg  #120 monthly  Hypothyroidism -Continue Synthroid  Recent left calcaneus/talus fracture -Status post ORIF 07/04/2020--Dr. Susa Simmonds      Status is: Inpatient  Remains inpatient appropriate because:IV treatments appropriate due to intensity of illness or inability to take PO   Dispo: The patient is from: Home  Anticipated d/c is to: Home  Patient currently is not medically stable to d/c.              Difficult to place patient No        Family Communication:   Spouse updated 3/23  Consultants:  GI  Code Status:  FULL   DVT Prophylaxis:   Heparin   Procedures: As Listed in Progress Note Above  Antibiotics: Zosyn 3/22>>     Subjective: Patient is breathing better.  He has nonproductive cough.  Denies cp, sob, n/v/d, abd pain  Objective: Vitals:   11/06/20 2029 11/06/20 2050 11/07/20 0346 11/07/20 0826  BP:  121/78 (!) 96/56 102/80  Pulse:  65 70 65  Resp:  19 19   Temp:  97.6 F (36.4 C) (!) 97.5 F (36.4 C) 98.1 F (36.7 C)  TempSrc:    Oral  SpO2: 97% 97% 95%   Weight:      Height:        Intake/Output Summary (Last 24 hours) at 11/07/2020 1410 Last data filed at 11/06/2020 1500 Gross per 24 hour  Intake 240 ml  Output -  Net 240 ml   Weight change:  Exam:   General:  Pt is alert, follows commands appropriately, not in acute distress  HEENT: No icterus, No thrush, No neck  mass, [place]/AT  Cardiovascular: RRR, S1/S2, no rubs, no gallops  Respiratory: bibasilar rales.  No wheeze  Abdomen: Soft/+BS, non tender, non distended, no guarding  Extremities: No edema, No lymphangitis, No petechiae, No rashes, no synovitis   Data Reviewed: I have personally reviewed following labs and imaging studies Basic Metabolic  Panel: Recent Labs  Lab 11/05/20 0311 11/06/20 0629 11/07/20 0625  NA 140 139 139  K 3.5 4.2 3.9  CL 102 102 100  CO2 29 27 32  GLUCOSE 107* 116* 117*  BUN 8 7 8   CREATININE 0.84 0.77 0.84  CALCIUM 8.7* 8.6* 8.6*  MG  --   --  2.1   Liver Function Tests: Recent Labs  Lab 11/05/20 0311  AST 19  ALT 17  ALKPHOS 71  BILITOT 0.3  PROT 7.8  ALBUMIN 4.3   No results for input(s): LIPASE, AMYLASE in the last 168 hours. No results for input(s): AMMONIA in the last 168 hours. Coagulation Profile: Recent Labs  Lab 11/05/20 0311 11/06/20 0804  INR 0.9 1.1   CBC: Recent Labs  Lab 11/05/20 0311 11/06/20 1344 11/07/20 0625  WBC 8.3 6.2 4.5  NEUTROABS 5.6  --   --   HGB 13.8 11.9* 11.5*  HCT 43.1 37.0* 36.6*  MCV 91.3 91.8 91.0  PLT 173 124* 130*   Cardiac Enzymes: No results for input(s): CKTOTAL, CKMB, CKMBINDEX, TROPONINI in the last 168 hours. BNP: Invalid input(s): POCBNP CBG: Recent Labs  Lab 11/05/20 0843 11/06/20 0735 11/07/20 0752  GLUCAP 115* 114* 125*   HbA1C: No results for input(s): HGBA1C in the last 72 hours. Urine analysis:    Component Value Date/Time   COLORURINE YELLOW 11/05/2020 0730   APPEARANCEUR CLEAR 11/05/2020 0730   LABSPEC 1.012 11/05/2020 0730   PHURINE 5.0 11/05/2020 0730   GLUCOSEU NEGATIVE 11/05/2020 0730   HGBUR SMALL (A) 11/05/2020 0730   BILIRUBINUR NEGATIVE 11/05/2020 0730   KETONESUR NEGATIVE 11/05/2020 0730   PROTEINUR NEGATIVE 11/05/2020 0730   NITRITE NEGATIVE 11/05/2020 0730   LEUKOCYTESUR NEGATIVE 11/05/2020 0730   Sepsis Labs: @LABRCNTIP (procalcitonin:4,lacticidven:4) ) Recent Results (from the past 240 hour(s))  Blood Culture (routine x 2)     Status: None (Preliminary result)   Collection Time: 11/05/20  3:11 AM   Specimen: BLOOD  Result Value Ref Range Status   Specimen Description BLOOD LEFT ANTECUBITAL  Final   Special Requests   Final    BOTTLES DRAWN AEROBIC AND ANAEROBIC Blood Culture adequate  volume   Culture   Final    NO GROWTH 2 DAYS Performed at [facility], [address] [place], [place] [id number]    Report Status PENDING  Incomplete  Resp Panel by RT-PCR (Flu A&B, Covid) Nasopharyngeal Swab     Status: None   Collection Time: 11/05/20  3:16 AM   Specimen: Nasopharyngeal Swab; Nasopharyngeal(NP) swabs in vial transport medium  Result Value Ref Range Status   SARS Coronavirus 2 by RT PCR NEGATIVE NEGATIVE Final    Comment: (NOTE) SARS-CoV-2 target nucleic acids are NOT DETECTED.  The SARS-CoV-2 RNA is generally detectable in upper respiratory specimens during the acute phase of infection. The lowest concentration of SARS-CoV-2 viral copies this assay can detect is 138 copies/mL. A negative result does not preclude SARS-Cov-2 infection and should not be used as the sole basis for treatment or other patient management decisions. A negative result may occur with  improper specimen collection/handling, submission of specimen other than nasopharyngeal swab, presence of viral mutation(s) within the  areas targeted by this assay, and inadequate number of viral copies(<138 copies/mL). A negative result must be combined with clinical observations, patient history, and epidemiological information. The expected result is Negative.  Fact Sheet for Patients:  BloggerCourse.com  Fact Sheet for Healthcare Providers:  SeriousBroker.it  This test is no t yet approved or cleared by the [place] FDA and  has been authorized for detection and/or diagnosis of SARS-CoV-2 by FDA under an Emergency Use Authorization (EUA). This EUA will remain  in effect (meaning this test can be used) for the duration of the COVID-19 declaration under Section 564(b)(1) of the Act, 21 U.S.C.section 360bbb-3(b)(1), unless the authorization is terminated  or revoked sooner.       Influenza A by PCR NEGATIVE NEGATIVE Final   Influenza B by PCR  NEGATIVE NEGATIVE Final    Comment: (NOTE) The Xpert Xpress SARS-CoV-2/FLU/RSV plus assay is intended as an aid in the diagnosis of influenza from Nasopharyngeal swab specimens and should not be used as a sole basis for treatment. Nasal washings and aspirates are unacceptable for Xpert Xpress SARS-CoV-2/FLU/RSV testing.  Fact Sheet for Patients: BloggerCourse.com  Fact Sheet for Healthcare Providers: SeriousBroker.it  This test is not yet approved or cleared by the [place] FDA and has been authorized for detection and/or diagnosis of SARS-CoV-2 by FDA under an Emergency Use Authorization (EUA). This EUA will remain in effect (meaning this test can be used) for the duration of the COVID-19 declaration under Section 564(b)(1) of the Act, 21 U.S.C. section 360bbb-3(b)(1), unless the authorization is terminated or revoked.  Performed at [facility], [address], [place], [place] [address]   Blood Culture (routine x 2)     Status: None (Preliminary result)   Collection Time: 11/05/20  5:02 AM   Specimen: BLOOD  Result Value Ref Range Status   Specimen Description BLOOD BLOOD RIGHT HAND  Final   Special Requests   Final    Blood Culture adequate volume BOTTLES DRAWN AEROBIC AND ANAEROBIC   Culture   Final    NO GROWTH 2 DAYS Performed at [facility], [address], [place], [place] [address]    Report Status PENDING  Incomplete  Urine culture     Status: None   Collection Time: 11/05/20  7:30 AM   Specimen: Urine, Random  Result Value Ref Range Status   Specimen Description   Final    URINE, RANDOM Performed at [facility], [address], [place], [place] [address]    Special Requests   Final    NONE Performed at [facility], [address] [place], [place] [address]    Culture   Final    NO GROWTH Performed at [facility], [address] [place], [place] [address]    Report Status 11/06/2020 FINAL   Final  MRSA PCR Screening     Status: None   Collection Time: 11/05/20  1:45 PM   Specimen: Nasal Mucosa; Nasopharyngeal  Result Value Ref Range Status   MRSA by PCR NEGATIVE NEGATIVE Final    Comment:        The GeneXpert MRSA Assay (FDA approved for NASAL specimens only), is one component of a comprehensive MRSA colonization surveillance program. It is not intended to diagnose MRSA infection nor to guide or monitor treatment for MRSA infections. Performed at [facility], [address] [place], [place] [address]      Scheduled Meds: . aspirin EC  81 mg Oral QHS  . chlorhexidine  15 mL Mouth Rinse BID  . diltiazem  120 mg Oral Daily  . famotidine  40 mg Oral Daily  . heparin  5,000 Units Subcutaneous Q8H  . levothyroxine  25 mcg Oral Daily  . mouth rinse  15 mL Mouth Rinse BID  . mouth rinse  15 mL Mouth Rinse q12n4p  . mouth rinse  15 mL Mouth Rinse q12n4p  . sodium chloride flush  3 mL Intravenous Q12H  . sodium chloride flush  3 mL Intravenous Q12H   Continuous Infusions: . sodium chloride    . piperacillin-tazobactam (ZOSYN)  IV 3.375 g (11/07/20 1333)    Procedures/Studies: DG Chest 2 View  Result Date: 10/24/2020 CLINICAL DATA:  Concern for aspiration. History of aspiration pneumonia due to esophageal defect. EXAM: CHEST - 2 VIEW COMPARISON:  Most recent radiograph 10/10/2020.  Chest CT 08/05/2020 FINDINGS: Improved patchy bilateral airspace disease from prior exam, mild residual. No new or progressive consolidation. The heart is normal in size with normal mediastinal contours. No pleural fluid or pneumothorax. Chronic anterior wedging of vertebra at the thoracolumbar junction. IMPRESSION: Improved bilateral patchy airspace disease from prior exam, mild residual. Electronically Signed   By: Narda Rutherford M.D.   On: 10/24/2020 19:44   CT Angio Chest PE W and/or Wo Contrast  Result Date: 11/05/2020 CLINICAL DATA:  Extensive history of aspiration pneumonia EXAM:  CT ANGIOGRAPHY CHEST WITH CONTRAST TECHNIQUE: Multidetector CT imaging of the chest was performed using the standard protocol during bolus administration of intravenous contrast. Multiplanar CT image reconstructions and MIPs were obtained to evaluate the vascular anatomy. CONTRAST:  OMNIPAQUE IOHEXOL 350 MG/ML SOLN COMPARISON:  Radiograph 11/05/2020, CT angiography 08/05/2020 FINDINGS: Cardiovascular: Satisfactory opacification the pulmonary arteries to the segmental level. No pulmonary artery filling defects are identified. Central pulmonary arteries are normal caliber. Normal heart size. No pericardial effusion. The aortic root is suboptimally assessed given cardiac pulsation artifact. The aorta is normal caliber. No acute luminal abnormality of the imaged aorta. No periaortic stranding or hemorrhage. Shared origin of the brachiocephalic and left common carotid arteries. Proximal great vessels are free of acute abnormality. No major venous abnormalities are seen. Mediastinum/Nodes: Stable appearance of a 1.5 cm right hilar lymph node, not significantly changed from most recent comparison (4/44). Additional stable right infrahilar lymph node measuring 10 mm (4/52). No new or enlarged adenopathy is seen. Several additional scattered subcentimeter mediastinal and hilar nodes are present, favored to be reactive. Patulous, fluid-filled thoracic esophagus to the level of the thoracic inlet. Posterior bowing of the trachea compatible with imaging during exhalation. Mild central airways thickening, as described below. Thyroid gland and thoracic inlet are unremarkable. Lungs/Pleura: Diffusely thickened central airways with scattered secretions in several more fluid-filled airways particularly in the right middle and lower lobe. Multifocal areas of mixed reticulonodular and patchy ground-glass opacities throughout both lungs, such an appearance and distribution is quite similar to the comparison exam. No pneumothorax.  No visible layering pleural effusion. Mild paraseptal emphysematous changes. Some interlobular septal thickening and vascular redistribution could reflect mild superimposed interstitial edematous change. Upper Abdomen: Heterogeneously enhancing spleen is likely related to contrast timing. No acute abnormalities present in the visualized portions of the upper abdomen. Minimal upper abdominal atherosclerosis. Musculoskeletal: Degenerative changes are present in the imaged spine and shoulders. No acute osseous abnormality or suspicious osseous lesion. No worrisome chest wall masses or lesions. Review of the MIP images confirms the above findings. IMPRESSION: 1. No evidence of acute pulmonary artery filling defects. 2. Multifocal  areas of mixed reticulonodular and patchy ground-glass opacities throughout both lungs, on a background of diffusely fluid-filled and thickened airways. Appearance could reflect atypical infection or sequela of chronic and ongoing aspiration particularly given the presence of the patulous fluid-filled thoracic esophagus. 3. Some interlobular septal thickening and vascular redistribution could reflect mild superimposed interstitial edematous change. 4. Stable appearance of enlarged right hilar lymph nodes, likely reactive and or edematous. 5. Emphysema (ICD10-J43.9). 6.  Aortic Atherosclerosis (ICD10-I70.0). Electronically Signed   By: Kreg Shropshire M.D.   On: 11/05/2020 05:39   DG Chest Portable 1 View  Result Date: 11/05/2020 CLINICAL DATA:  Hypoxia EXAM: PORTABLE CHEST 1 VIEW COMPARISON:  10/24/2020 FINDINGS: The heart size and mediastinal contours are within normal limits. Both lungs are clear. The visualized skeletal structures are unremarkable. IMPRESSION: No active disease. Electronically Signed   By: Deatra Robinson M.D.   On: 11/05/2020 03:28   DG Chest Port 1 View  Result Date: 10/10/2020 CLINICAL DATA:  Hypoxia.  Respiratory failure. EXAM: PORTABLE CHEST 1 VIEW COMPARISON:   07/08/2021. FINDINGS: Heart size normal. Persistent right mid lung and base infiltrate most consistent with pneumonia. No interim change. No pleural effusion or pneumothorax. Degenerative change thoracic spine. IMPRESSION: Persistent right mid lung and base infiltrate most consistent with pneumonia. No interim change. Electronically Signed   By: Maisie Fus  Register   On: 10/10/2020 11:24   DG Swallowing Func-Speech Pathology  Result Date: 11/05/2020 Objective Swallowing Evaluation: Type of Study: MBS-Modified Barium Swallow Study  Patient Details Name: PERLEY ARTHURS MRN: [id number] Date of Birth: 1960-09-12 Today's Date: 11/05/2020 Time: SLP Start Time (ACUTE ONLY): 1235 -SLP Stop Time (ACUTE ONLY): 1305 SLP Time Calculation (min) (ACUTE ONLY): 30 min Past Medical History: Past Medical History: Diagnosis Date . Anxiety  . Aspiration pneumonia (HCC) 05/26/2018  RLL . Chronic pain  . Closed fracture of xiphoid process  . Esophageal stricture  . GERD (gastroesophageal reflux disease)  . 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  Marland Kitchen Opiate abuse, continuous (HCC)  . Pulmonary edema 02/02/2018 . Sepsis due to pneumonia (HCC) 03/11/2019 . Sleep apnea   CPAP nightly Past Surgical History: Past Surgical History: Procedure Laterality Date . BIOPSY  03/18/2018  Procedure: BIOPSY;  Surgeon: Malissa Hippo, MD;  Location: [facility];  Service: Endoscopy;;  gastric  . COLONOSCOPY N/A 11/22/2013  Dr. Karilyn Cota: Normal except for hemorrhoids.  Next colonoscopy 10 years. . ESOPHAGEAL DILATION N/A 03/18/2018  Procedure: ESOPHAGEAL DILATION;  Surgeon: Malissa Hippo, MD;  Location: [facility];  Service: Endoscopy;  Laterality: N/A; . ESOPHAGEAL DILATION N/A 08/07/2020  Procedure: ESOPHAGEAL DILATION;  Surgeon: Malissa Hippo, MD;  Location: [facility];  Service: Endoscopy;  Laterality: N/A; . ESOPHAGEAL MANOMETRY N/A 06/15/2018  Procedure: ESOPHAGEAL MANOMETRY (EM);  Surgeon: Napoleon Form, MD;  Location: [facility]  ENDOSCOPY;  Service: Endoscopy;  Laterality: N/A; . ESOPHAGOGASTRODUODENOSCOPY (EGD) WITH PROPOFOL N/A 03/18/2018  Dr. Karilyn Cota: Benign-appearing mild esophageal stenosis proximal to the GE J, status post dilation.  2 cm hiatal hernia.  Gastritis, biopsies negative for H. pylori . ESOPHAGOGASTRODUODENOSCOPY (EGD) WITH PROPOFOL N/A 08/07/2020  Procedure: ESOPHAGOGASTRODUODENOSCOPY (EGD) WITH PROPOFOL;  Surgeon: Malissa Hippo, MD;  Location: [facility];  Service: Endoscopy;  Laterality: N/A; . Fracture Right Leg    Patient has rod/screws in this leg . ORIF CALCANEOUS FRACTURE Left 07/04/2020  Procedure: OPEN REDUCTION INTERNAL FIXATION (ORIF) LEFT TALUS AND CALCANEOUS FRACTURE, SUBTALAR JOINT ARTHROTOMY WITH REMOVAL OF LOOSE BODIES, TALONAVICULAR JOINT  ARTHOTOMY WITH REMOVAL OF LOOSE BODIES;  Surgeon: Terance Hart, MD;  Location: [facility] OR;  Service: Orthopedics;  Laterality: Left; . Rotor Cuff  2012  Right Shoulder HPI: Christian Ellison is a 60 y.o. male with a history of recurrent aspiration pneumonia due to esophageal anatomical issue here with increased work of breathing and hypoxia with concern for aspiration again.  States he developed difficulty breathing over the past 2 days.  Wife checked his oxygen level at home and it was 68%.  Was 81% in triage.  He is on 6 L now at 96%.  He does feel better.  Does not wear oxygen at home. He has felt warm but denies any fever.  Denies any chest pain, abdominal pain.  Has had a few issues of vomiting. He underwent a barium swallow yesterday which showed Esophagus is negative for stricture, ulceration or diverticula. Residual contrast visualized in the lower esophagus at 2 minutes and 5 minutes. No hiatal hernia is seen. Dysphagia evaluation requested.  Subjective: "I don't know why we are doing this." Assessment / Plan / Recommendation CHL IP CLINICAL IMPRESSIONS 11/05/2020 Clinical Impression Pt presents with primary esophageal dysphagia which negatively impacts  pharyngeal phase. Pt with premature spillage of liquids to the pyriforms with swallow initiation at the level of the pyriforms, no penetration or aspiration. Pt with trace pyriform residuals post swallow with liquids and puree. Pt with adequate hyolaryngeal excursion and tongue base retraction. He required two swallows to move the barium tablet taken with cup sip thin out of the oral cavity. Esophageal sweep revealed standing column of barium with bird peak appearance near GEJ even after thin liquids and puree only. Retention of food/barium increased throughout the study with eventual backflow of barium into the cervical esophagus. Pt has a follow up appointment in April per his report with the GI team at [facility] for consideration of possible Botox. Pt may be best to adhere to liquids only until he is seen. Pt is at risk for post prandial aspiration due to esophageal retention across textures and consistencies. Pt was encouraged to sit fully upright for all eating and drinking and remain upright for greater than 30 minutes post meals. He should also elevate the head of his bed (he only uses extra pillows at this time). SLP showed the imaging from the MBSS to Pt and reviewed esophageal swallow precautions. SLP will follow up x1 during acute stay to reinforce recommendations from today. MD, please see imaging attached in Image section. SLP Visit Diagnosis Dysphagia, pharyngoesophageal phase (R13.14) Attention and concentration deficit following -- Frontal lobe and executive function deficit following -- Impact on safety and function Moderate aspiration risk   CHL IP TREATMENT RECOMMENDATION 11/05/2020 Treatment Recommendations Therapy as outlined in treatment plan below   Prognosis 11/05/2020 Prognosis for Safe Diet Advancement Guarded Barriers to Reach Goals (No Data) Barriers/Prognosis Comment -- CHL IP DIET RECOMMENDATION 11/05/2020 SLP Diet Recommendations (No Data) Liquid Administration via Cup;Straw Medication  Administration Whole meds with liquid Compensations -- Postural Changes Remain semi-upright after after feeds/meals (Comment);Seated upright at 90 degrees   CHL IP OTHER RECOMMENDATIONS 11/05/2020 Recommended Consults Consider esophageal assessment;Consider GI evaluation Oral Care Recommendations Oral care BID;Patient independent with oral care Other Recommendations Clarify dietary restrictions   CHL IP FOLLOW UP RECOMMENDATIONS 11/05/2020 Follow up Recommendations None   CHL IP FREQUENCY AND DURATION 11/05/2020 Speech Therapy Frequency (ACUTE ONLY) min 1 x/week Treatment Duration 1 week      CHL IP ORAL PHASE 11/05/2020 Oral  Phase Impaired Oral - Pudding Teaspoon -- Oral - Pudding Cup -- Oral - Honey Teaspoon -- Oral - Honey Cup -- Oral - Nectar Teaspoon -- Oral - Nectar Cup -- Oral - Nectar Straw -- Oral - Thin Teaspoon Premature spillage Oral - Thin Cup Premature spillage Oral - Thin Straw -- Oral - Puree -- Oral - Mech Soft -- Oral - Regular -- Oral - Multi-Consistency -- Oral - Pill -- Oral Phase - Comment --  CHL IP PHARYNGEAL PHASE 11/05/2020 Pharyngeal Phase Impaired Pharyngeal- Pudding Teaspoon -- Pharyngeal -- Pharyngeal- Pudding Cup -- Pharyngeal -- Pharyngeal- Honey Teaspoon -- Pharyngeal -- Pharyngeal- Honey Cup -- Pharyngeal -- Pharyngeal- Nectar Teaspoon -- Pharyngeal -- Pharyngeal- Nectar Cup -- Pharyngeal -- Pharyngeal- Nectar Straw -- Pharyngeal -- Pharyngeal- Thin Teaspoon Delayed swallow initiation-pyriform sinuses;Pharyngeal residue - pyriform Pharyngeal -- Pharyngeal- Thin Cup Delayed swallow initiation-pyriform sinuses;Pharyngeal residue - pyriform Pharyngeal -- Pharyngeal- Thin Straw Pharyngeal residue - pyriform Pharyngeal -- Pharyngeal- Puree Delayed swallow initiation-vallecula;Pharyngeal residue - pyriform Pharyngeal -- Pharyngeal- Mechanical Soft -- Pharyngeal -- Pharyngeal- Regular Delayed swallow initiation-vallecula Pharyngeal -- Pharyngeal- Multi-consistency -- Pharyngeal -- Pharyngeal-  Pill WFL Pharyngeal -- Pharyngeal Comment --  CHL IP CERVICAL ESOPHAGEAL PHASE 11/05/2020 Cervical Esophageal Phase Impaired Pudding Teaspoon -- Pudding Cup -- Honey Teaspoon -- Honey Cup -- Nectar Teaspoon -- Nectar Cup -- Nectar Straw -- Thin Teaspoon -- Thin Cup -- Thin Straw -- Puree Esophageal backflow into cervical esophagus Mechanical Soft -- Regular -- Multi-consistency -- Pill -- Cervical Esophageal Comment full standing column of barium throughout the study with bird peak appearance near GEJ, barium backflow to the UES but did not go into pharynx Thank you, Havery Moros, CCC-SLP [phone] PORTER,DABNEY 11/05/2020, 5:01 PM               Catarina Hartshorn, DO  [facility]  If 7PM-7AM, please contact night-coverage [email or web address] Password [email or web address] 11/07/2020, 2:10 PM   LOS: 2 days

## 2020-11-08 LAB — CBC
HCT: 35.6 % — ABNORMAL LOW (ref 39.0–52.0)
Hemoglobin: 11.4 g/dL — ABNORMAL LOW (ref 13.0–17.0)
MCH: 28.9 pg (ref 26.0–34.0)
MCHC: 32 g/dL (ref 30.0–36.0)
MCV: 90.4 fL (ref 80.0–100.0)
Platelets: 157 10*3/uL (ref 150–400)
RBC: 3.94 MIL/uL — ABNORMAL LOW (ref 4.22–5.81)
RDW: 13.1 % (ref 11.5–15.5)
WBC: 3.7 10*3/uL — ABNORMAL LOW (ref 4.0–10.5)
nRBC: 0 % (ref 0.0–0.2)

## 2020-11-08 LAB — BASIC METABOLIC PANEL
Anion gap: 9 (ref 5–15)
BUN: 9 mg/dL (ref 6–20)
CO2: 31 mmol/L (ref 22–32)
Calcium: 8.6 mg/dL — ABNORMAL LOW (ref 8.9–10.3)
Chloride: 102 mmol/L (ref 98–111)
Creatinine, Ser: 0.74 mg/dL (ref 0.61–1.24)
GFR, Estimated: >60 mL/min (ref >60)
Glucose, Bld: 118 mg/dL — ABNORMAL HIGH (ref 70–99)
Potassium: 3.8 mmol/L (ref 3.5–5.1)
Sodium: 142 mmol/L (ref 135–145)

## 2020-11-08 LAB — GLUCOSE, CAPILLARY: Glucose-Capillary: 119 mg/dL — ABNORMAL HIGH (ref 70–99)

## 2020-11-08 MED ORDER — AMOXICILLIN-POT CLAVULANATE 875-125 MG PO TABS: 1.0000 | ORAL_TABLET | Freq: Two times a day (BID) | ORAL | 0 refills | Status: DC

## 2020-11-08 MED ORDER — AMOXICILLIN-POT CLAVULANATE 875-125 MG PO TABS: 1.0000 | ORAL_TABLET | Freq: Two times a day (BID) | ORAL | Status: DC

## 2020-11-08 NOTE — Progress Notes (Signed)
Pharmacy Antibiotic Note  Christian Ellison is a 60 y.o. male admitted on 11/05/2020 with aspiration pna.  Pharmacy has been consulted for Zosyn and Vancomycin dosing.  Plan:  Zosyn 3.375gm IV q8h Will f/u renal function, micro data, and pt's clinical condition  Height: 6' (182.9 cm) Weight: 90.7 kg (200 lb) IBW/kg (Calculated) : 77.6  Temp (24hrs), Avg:98 F (36.7 C), Min:97.7 F (36.5 C), Max:98.2 F (36.8 C)  Recent Labs  Lab 11/05/20 0311 11/05/20 0502 11/05/20 0753 11/05/20 0953 11/06/20 0629 11/06/20 1344 11/07/20 0625 11/08/20 0440  WBC 8.3  --   --   --   --  6.2 4.5 3.7*  CREATININE 0.84  --   --   --  0.77  --  0.84 0.74  LATICACIDVEN 2.8* 2.5* 1.8 1.3  --   --   --   --     Estimated Creatinine Clearance: 110.5 mL/min (by C-G formula based on SCr of 0.74 mg/dL).    No Known Allergies  Antimicrobials this admission: 3/22 Zosyn >>  3/22 Vancomycin >> 3/23  Microbiology results: 3/22 BCx: ngtd 3/22 UCx: ng MRSA PCR: neg  Thank you for allowing pharmacy to be a part of this patient's care.  Judeth Cornfield, PharmD Clinical Pharmacist 11/08/2020 8:35 AM

## 2020-11-08 NOTE — Final Progress Note (Signed)
Patient leaving unit after education on condition, was discharged by physician, accompanied by wife, no signs or symptoms of distress

## 2020-11-08 NOTE — Progress Notes (Signed)
Pt unable to receive 0600 dose of Zosyn 3.375 g IVPB d/t loss of IV access. MD made aware.

## 2020-11-08 NOTE — Progress Notes (Signed)
Pt called for nurse to come to his room, upon this nurse entering pt room, pt meet nurse at the door with mobile tele machine and all leads off, pt stated he was not putting machine back on d/t machine making to much noise. Pt also had pulled out his IV and had moderate amount of blood on his clothes. Pt started packing all his belongings and stated he was leaving the hospital. This nurse and charge nurse educated pt on importance of staying at hospital until discharged by the doctor. This nurse informed pt that doctor would be making rounds in the morning. Pt agreed to stay until the morning, but insisted he was leaving after talking to the doctor in the morning and that he was not getting back hooked up to nothing.

## 2020-11-08 NOTE — Discharge Summary (Signed)
Physician Discharge Summary  Christian Ellison ONG:[id number] DOB: 04/02/1961 DOA: 11/05/2020  PCP: Anabel Halon, MD  Admit date: 11/05/2020 Discharge date: 11/08/2020  Admitted From: Home Disposition:  Home   Recommendations for Outpatient Follow-up:  1. Follow up with PCP in 1-2 weeks 2. Please obtain BMP/CBC in one week     Discharge Condition: Stable CODE STATUS: FULL Diet recommendation: dys 3 diet with thin liquids   Brief/Interim Summary: 59 year old male with a history of esophageal dysmotility/esophageal stricture, recurrent aspiration pneumonia, hypothyroidism, anxiety, prior polysubstance abuseand recovered Hep Cpresenting with 1-day history of coughinghypoxiaand shortness of breath. The patient feels as if he is "regurgitating my food"again.Reporting since yesterday he has difficulty breathing, his wife checked his pulse ox was as low as 68, 81 in triage in ED, on 6 L improved to 96%.Marland KitchenMarland KitchenHe is not O2 dependent at home.  ED Course:  Vitals:Likely stable, temp 98.7, PR 107, RR 25, BP 124/71, satting 94% on 6 L of oxygen Abnormal labs;CBC CMP within normal limits, lactic acid 2.8, 2.5, WBC 8.3, CTAngiogram-negative for PE, positive for emphysema, multifocal reticulonodular & patchy GGO bilateral on a background of fluid filled thickened airways.    Discharge Diagnoses:  Severe sepsis -Secondary to pneumonia -Present on admission -Presented with tachycardia, fever, and elevated lactic acid -Continue IV fluids -Continue IV Zosyn>>>d/c home with amox/clav -Follow blood cultures--neg to date -UA negative for pyuria  Aspiration pneumonia -CTA chest negative for PE;other findings as discussed above -Continue Zosyn during hospitalization -d/c home with amox/clav x 5 more days  Acute respiratory failure with hypoxia -Secondary to pneumonia -stable on15L>>8L>>5L>>RA -wean oxygen for saturation 90-92% -3/25--patient ambulated on RA without  desaturation  Esophageal dysmotility/stricture/Achalasia -GI consultappreciated-->no need for any urgent endoscopic therapy at the moment -[facility]2020 by Dr. Karie Georges and Pearletha Forge and he was set up to proceed with a repeat EGD with intraprocedural FLIP and Botox injection. They also requested a repeat esophageal manometry. He underwent yesterday time barium swallow that showed residual contrast in the lower esophagus at 2 and 5 minutes -undergoing eval for POEM (per oral esophageal myotomy) at [facility] -Continuefamotidine -continuediltiazem   Anxiety/depression -Continue home dose alprazolam -PMP Aware reviewed--receives alprazolam1 mg#120 monthly  Hypothyroidism -Continue Synthroid  Recent left calcaneus/talus fracture -Status post ORIF 07/04/2020--Dr. Susa Simmonds    Discharge Instructions   Allergies as of 11/08/2020   No Known Allergies     Medication List    TAKE these medications   acetaminophen 325 MG tablet Commonly known as: TYLENOL Take 2 tablets (650 mg total) by mouth every 6 (six) hours as needed for mild pain (or Fever >/= 101).   albuterol 108 (90 Base) MCG/ACT inhaler Commonly known as: VENTOLIN HFA Inhale 2 puffs into the lungs every 6 (six) hours as needed for wheezing or shortness of breath.   ALPRAZolam 1 MG tablet Commonly known as: XANAX Take 1 mg by mouth every 6 (six) hours as needed for anxiety.   amoxicillin-clavulanate 875-125 MG tablet Commonly known as: AUGMENTIN Take 1 tablet by mouth every 12 (twelve) hours.   aspirin EC 81 MG tablet Take 1 tablet (81 mg total) by mouth daily with breakfast. What changed: when to take this   diltiazem 120 MG 24 hr capsule Commonly known as: DILACOR XR Take 1 capsule (120 mg total) by mouth daily.   Glucosamine HCl 1000 MG Tabs Take 1,000 mg by mouth at bedtime.   levothyroxine 25 MCG tablet Commonly known as: SYNTHROID Take 1 tablet (25  mcg total) by mouth daily.    multivitamin with minerals Tabs tablet Take 1 tablet by mouth daily. 50+   pantoprazole 40 MG tablet Commonly known as: Protonix Take 1 tablet (40 mg total) by mouth 2 (two) times daily.   TAGAMET PO Take 75 mg by mouth daily as needed (if he feels his food coming up after meal).       No Known Allergies  Consultations:  none   Procedures/Studies: DG Chest 2 View  Result Date: 10/24/2020 CLINICAL DATA:  Concern for aspiration. History of aspiration pneumonia due to esophageal defect. EXAM: CHEST - 2 VIEW COMPARISON:  Most recent radiograph 10/10/2020.  Chest CT 08/05/2020 FINDINGS: Improved patchy bilateral airspace disease from prior exam, mild residual. No new or progressive consolidation. The heart is normal in size with normal mediastinal contours. No pleural fluid or pneumothorax. Chronic anterior wedging of vertebra at the thoracolumbar junction. IMPRESSION: Improved bilateral patchy airspace disease from prior exam, mild residual. Electronically Signed   By: Narda Rutherford M.D.   On: 10/24/2020 19:44   CT Angio Chest PE W and/or Wo Contrast  Result Date: 11/05/2020 CLINICAL DATA:  Extensive history of aspiration pneumonia EXAM: CT ANGIOGRAPHY CHEST WITH CONTRAST TECHNIQUE: Multidetector CT imaging of the chest was performed using the standard protocol during bolus administration of intravenous contrast. Multiplanar CT image reconstructions and MIPs were obtained to evaluate the vascular anatomy. CONTRAST:  OMNIPAQUE IOHEXOL 350 MG/ML SOLN COMPARISON:  Radiograph 11/05/2020, CT angiography 08/05/2020 FINDINGS: Cardiovascular: Satisfactory opacification the pulmonary arteries to the segmental level. No pulmonary artery filling defects are identified. Central pulmonary arteries are normal caliber. Normal heart size. No pericardial effusion. The aortic root is suboptimally assessed given cardiac pulsation artifact. The aorta is normal caliber. No acute luminal abnormality of  the imaged aorta. No periaortic stranding or hemorrhage. Shared origin of the brachiocephalic and left common carotid arteries. Proximal great vessels are free of acute abnormality. No major venous abnormalities are seen. Mediastinum/Nodes: Stable appearance of a 1.5 cm right hilar lymph node, not significantly changed from most recent comparison (4/44). Additional stable right infrahilar lymph node measuring 10 mm (4/52). No new or enlarged adenopathy is seen. Several additional scattered subcentimeter mediastinal and hilar nodes are present, favored to be reactive. Patulous, fluid-filled thoracic esophagus to the level of the thoracic inlet. Posterior bowing of the trachea compatible with imaging during exhalation. Mild central airways thickening, as described below. Thyroid gland and thoracic inlet are unremarkable. Lungs/Pleura: Diffusely thickened central airways with scattered secretions in several more fluid-filled airways particularly in the right middle and lower lobe. Multifocal areas of mixed reticulonodular and patchy ground-glass opacities throughout both lungs, such an appearance and distribution is quite similar to the comparison exam. No pneumothorax. No visible layering pleural effusion. Mild paraseptal emphysematous changes. Some interlobular septal thickening and vascular redistribution could reflect mild superimposed interstitial edematous change. Upper Abdomen: Heterogeneously enhancing spleen is likely related to contrast timing. No acute abnormalities present in the visualized portions of the upper abdomen. Minimal upper abdominal atherosclerosis. Musculoskeletal: Degenerative changes are present in the imaged spine and shoulders. No acute osseous abnormality or suspicious osseous lesion. No worrisome chest wall masses or lesions. Review of the MIP images confirms the above findings. IMPRESSION: 1. No evidence of acute pulmonary artery filling defects. 2. Multifocal areas of mixed  reticulonodular and patchy ground-glass opacities throughout both lungs, on a background of diffusely fluid-filled and thickened airways. Appearance could reflect atypical infection or sequela of chronic and ongoing  aspiration particularly given the presence of the patulous fluid-filled thoracic esophagus. 3. Some interlobular septal thickening and vascular redistribution could reflect mild superimposed interstitial edematous change. 4. Stable appearance of enlarged right hilar lymph nodes, likely reactive and or edematous. 5. Emphysema (ICD10-J43.9). 6.  Aortic Atherosclerosis (ICD10-I70.0). Electronically Signed   By: Kreg Shropshire M.D.   On: 11/05/2020 05:39   DG Chest Portable 1 View  Result Date: 11/05/2020 CLINICAL DATA:  Hypoxia EXAM: PORTABLE CHEST 1 VIEW COMPARISON:  10/24/2020 FINDINGS: The heart size and mediastinal contours are within normal limits. Both lungs are clear. The visualized skeletal structures are unremarkable. IMPRESSION: No active disease. Electronically Signed   By: Deatra Robinson M.D.   On: 11/05/2020 03:28   DG Chest Port 1 View  Result Date: 10/10/2020 CLINICAL DATA:  Hypoxia.  Respiratory failure. EXAM: PORTABLE CHEST 1 VIEW COMPARISON:  07/08/2021. FINDINGS: Heart size normal. Persistent right mid lung and base infiltrate most consistent with pneumonia. No interim change. No pleural effusion or pneumothorax. Degenerative change thoracic spine. IMPRESSION: Persistent right mid lung and base infiltrate most consistent with pneumonia. No interim change. Electronically Signed   By: Maisie Fus  Register   On: 10/10/2020 11:24   DG Swallowing Func-Speech Pathology  Result Date: 11/05/2020 Objective Swallowing Evaluation: Type of Study: MBS-Modified Barium Swallow Study  Patient Details Name: Christian Ellison MRN: [id number] Date of Birth: [birth year] Today's Date: 11/05/2020 Time: SLP Start Time (ACUTE ONLY): 1235 -SLP Stop Time (ACUTE ONLY): 1305 SLP Time Calculation (min) (ACUTE ONLY):  30 min Past Medical History: Past Medical History: Diagnosis Date . Anxiety  . Aspiration pneumonia (HCC) 05/26/2018  RLL . Chronic pain  . Closed fracture of xiphoid process  . Esophageal stricture  . GERD (gastroesophageal reflux disease)  . 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  Marland Kitchen Opiate abuse, continuous (HCC)  . Pulmonary edema 02/02/2018 . Sepsis due to pneumonia (HCC) 03/11/2019 . Sleep apnea   CPAP nightly Past Surgical History: Past Surgical History: Procedure Laterality Date . BIOPSY  03/18/2018  Procedure: BIOPSY;  Surgeon: Malissa Hippo, MD;  Location: [facility];  Service: Endoscopy;;  gastric  . COLONOSCOPY N/A 11/22/2013  Dr. Karilyn Cota: Normal except for hemorrhoids.  Next colonoscopy 10 years. . ESOPHAGEAL DILATION N/A 03/18/2018  Procedure: ESOPHAGEAL DILATION;  Surgeon: Malissa Hippo, MD;  Location: [facility];  Service: Endoscopy;  Laterality: N/A; . ESOPHAGEAL DILATION N/A 08/07/2020  Procedure: ESOPHAGEAL DILATION;  Surgeon: Malissa Hippo, MD;  Location: [facility];  Service: Endoscopy;  Laterality: N/A; . ESOPHAGEAL MANOMETRY N/A 06/15/2018  Procedure: ESOPHAGEAL MANOMETRY (EM);  Surgeon: Napoleon Form, MD;  Location: [facility];  Service: Endoscopy;  Laterality: N/A; . ESOPHAGOGASTRODUODENOSCOPY (EGD) WITH PROPOFOL N/A 03/18/2018  Dr. Karilyn Cota: Benign-appearing mild esophageal stenosis proximal to the GE J, status post dilation.  2 cm hiatal hernia.  Gastritis, biopsies negative for H. pylori . ESOPHAGOGASTRODUODENOSCOPY (EGD) WITH PROPOFOL N/A 08/07/2020  Procedure: ESOPHAGOGASTRODUODENOSCOPY (EGD) WITH PROPOFOL;  Surgeon: Malissa Hippo, MD;  Location: [facility];  Service: Endoscopy;  Laterality: N/A; . Fracture Right Leg    Patient has rod/screws in this leg . ORIF CALCANEOUS FRACTURE Left 07/04/2020  Procedure: OPEN REDUCTION INTERNAL FIXATION (ORIF) LEFT TALUS AND CALCANEOUS FRACTURE, SUBTALAR JOINT ARTHROTOMY WITH REMOVAL OF LOOSE BODIES, TALONAVICULAR JOINT  ARTHOTOMY WITH REMOVAL OF LOOSE BODIES;  Surgeon: Terance Hart, MD;  Location: [facility];  Service: Orthopedics;  Laterality: Left; . Rotor Cuff  2012  Right Shoulder HPI:  Silas Muff Marcom is a 60 y.o. male with a history of recurrent aspiration pneumonia due to esophageal anatomical issue here with increased work of breathing and hypoxia with concern for aspiration again.  States he developed difficulty breathing over the past 2 days.  Wife checked his oxygen level at home and it was 68%.  Was 81% in triage.  He is on 6 L now at 96%.  He does feel better.  Does not wear oxygen at home. He has felt warm but denies any fever.  Denies any chest pain, abdominal pain.  Has had a few issues of vomiting. He underwent a barium swallow yesterday which showed Esophagus is negative for stricture, ulceration or diverticula. Residual contrast visualized in the lower esophagus at 2 minutes and 5 minutes. No hiatal hernia is seen. Dysphagia evaluation requested.  Subjective: "I don't know why we are doing this." Assessment / Plan / Recommendation CHL IP CLINICAL IMPRESSIONS 11/05/2020 Clinical Impression Pt presents with primary esophageal dysphagia which negatively impacts pharyngeal phase. Pt with premature spillage of liquids to the pyriforms with swallow initiation at the level of the pyriforms, no penetration or aspiration. Pt with trace pyriform residuals post swallow with liquids and puree. Pt with adequate hyolaryngeal excursion and tongue base retraction. He required two swallows to move the barium tablet taken with cup sip thin out of the oral cavity. Esophageal sweep revealed standing column of barium with bird peak appearance near GEJ even after thin liquids and puree only. Retention of food/barium increased throughout the study with eventual backflow of barium into the cervical esophagus. Pt has a follow up appointment in April per his report with the GI team at [facility] for consideration of possible Botox. Pt may be  best to adhere to liquids only until he is seen. Pt is at risk for post prandial aspiration due to esophageal retention across textures and consistencies. Pt was encouraged to sit fully upright for all eating and drinking and remain upright for greater than 30 minutes post meals. He should also elevate the head of his bed (he only uses extra pillows at this time). SLP showed the imaging from the MBSS to Pt and reviewed esophageal swallow precautions. SLP will follow up x1 during acute stay to reinforce recommendations from today. MD, please see imaging attached in Image section. SLP Visit Diagnosis Dysphagia, pharyngoesophageal phase (R13.14) Attention and concentration deficit following -- Frontal lobe and executive function deficit following -- Impact on safety and function Moderate aspiration risk   CHL IP TREATMENT RECOMMENDATION 11/05/2020 Treatment Recommendations Therapy as outlined in treatment plan below   Prognosis 11/05/2020 Prognosis for Safe Diet Advancement Guarded Barriers to Reach Goals (No Data) Barriers/Prognosis Comment -- CHL IP DIET RECOMMENDATION 11/05/2020 SLP Diet Recommendations (No Data) Liquid Administration via Cup;Straw Medication Administration Whole meds with liquid Compensations -- Postural Changes Remain semi-upright after after feeds/meals (Comment);Seated upright at 90 degrees   CHL IP OTHER RECOMMENDATIONS 11/05/2020 Recommended Consults Consider esophageal assessment;Consider GI evaluation Oral Care Recommendations Oral care BID;Patient independent with oral care Other Recommendations Clarify dietary restrictions   CHL IP FOLLOW UP RECOMMENDATIONS 11/05/2020 Follow up Recommendations None   CHL IP FREQUENCY AND DURATION 11/05/2020 Speech Therapy Frequency (ACUTE ONLY) min 1 x/week Treatment Duration 1 week      CHL IP ORAL PHASE 11/05/2020 Oral Phase Impaired Oral - Pudding Teaspoon -- Oral - Pudding Cup -- Oral - Honey Teaspoon -- Oral - Honey Cup -- Oral - Nectar Teaspoon -- Oral -  Nectar Cup --  Oral - Nectar Straw -- Oral - Thin Teaspoon Premature spillage Oral - Thin Cup Premature spillage Oral - Thin Straw -- Oral - Puree -- Oral - Mech Soft -- Oral - Regular -- Oral - Multi-Consistency -- Oral - Pill -- Oral Phase - Comment --  CHL IP PHARYNGEAL PHASE 11/05/2020 Pharyngeal Phase Impaired Pharyngeal- Pudding Teaspoon -- Pharyngeal -- Pharyngeal- Pudding Cup -- Pharyngeal -- Pharyngeal- Honey Teaspoon -- Pharyngeal -- Pharyngeal- Honey Cup -- Pharyngeal -- Pharyngeal- Nectar Teaspoon -- Pharyngeal -- Pharyngeal- Nectar Cup -- Pharyngeal -- Pharyngeal- Nectar Straw -- Pharyngeal -- Pharyngeal- Thin Teaspoon Delayed swallow initiation-pyriform sinuses;Pharyngeal residue - pyriform Pharyngeal -- Pharyngeal- Thin Cup Delayed swallow initiation-pyriform sinuses;Pharyngeal residue - pyriform Pharyngeal -- Pharyngeal- Thin Straw Pharyngeal residue - pyriform Pharyngeal -- Pharyngeal- Puree Delayed swallow initiation-vallecula;Pharyngeal residue - pyriform Pharyngeal -- Pharyngeal- Mechanical Soft -- Pharyngeal -- Pharyngeal- Regular Delayed swallow initiation-vallecula Pharyngeal -- Pharyngeal- Multi-consistency -- Pharyngeal -- Pharyngeal- Pill WFL Pharyngeal -- Pharyngeal Comment --  CHL IP CERVICAL ESOPHAGEAL PHASE 11/05/2020 Cervical Esophageal Phase Impaired Pudding Teaspoon -- Pudding Cup -- Honey Teaspoon -- Honey Cup -- Nectar Teaspoon -- Nectar Cup -- Nectar Straw -- Thin Teaspoon -- Thin Cup -- Thin Straw -- Puree Esophageal backflow into cervical esophagus Mechanical Soft -- Regular -- Multi-consistency -- Pill -- Cervical Esophageal Comment full standing column of barium throughout the study with bird peak appearance near GEJ, barium backflow to the UES but did not go into pharynx Thank you, Havery Moros, CCC-SLP [phone] PORTER,DABNEY 11/05/2020, 5:01 PM                    Discharge Exam: Vitals:   11/08/20 0744 11/08/20 0823  BP: 116/76   Pulse: 72   Resp: 14   Temp:  97.7 F (36.5 C)   SpO2: 92% 98%   Vitals:   11/07/20 2310 11/08/20 0602 11/08/20 0744 11/08/20 0823  BP:  105/65 116/76   Pulse: 79 73 72   Resp: Temp:  98 F (36.7 C) 97.7 F (36.5 C)   TempSrc:  Oral Oral   SpO2: 98% 92% 92% 98%  Weight:      Height:        General: Pt is alert, awake, not in acute distress Cardiovascular: RRR, S1/S2 +, no rubs, no gallops Respiratory: bibasilar rales. No wheeze Abdominal: Soft, NT, ND, bowel sounds + Extremities: no edema, no cyanosis   The results of significant diagnostics from this hospitalization (including imaging, microbiology, ancillary and laboratory) are listed below for reference.    Significant Diagnostic Studies: DG Chest 2 View  Result Date: 10/24/2020 CLINICAL DATA:  Concern for aspiration. History of aspiration pneumonia due to esophageal defect. EXAM: CHEST - 2 VIEW COMPARISON:  Most recent radiograph 10/10/2020.  Chest CT 08/05/2020 FINDINGS: Improved patchy bilateral airspace disease from prior exam, mild residual. No new or progressive consolidation. The heart is normal in size with normal mediastinal contours. No pleural fluid or pneumothorax. Chronic anterior wedging of vertebra at the thoracolumbar junction. IMPRESSION: Improved bilateral patchy airspace disease from prior exam, mild residual. Electronically Signed   By: Narda Rutherford M.D.   On: 10/24/2020 19:44   CT Angio Chest PE W and/or Wo Contrast  Result Date: 11/05/2020 CLINICAL DATA:  Extensive history of aspiration pneumonia EXAM: CT ANGIOGRAPHY CHEST WITH CONTRAST TECHNIQUE: Multidetector CT imaging of the chest was performed using the standard protocol during bolus administration of intravenous contrast. Multiplanar CT image reconstructions and MIPs  were obtained to evaluate the vascular anatomy. CONTRAST:  100mL OMNIPAQUE IOHEXOL 350 MG/ML SOLN COMPARISON:  Radiograph 11/05/2020, CT angiography 08/05/2020 FINDINGS: Cardiovascular: Satisfactory  opacification the pulmonary arteries to the segmental level. No pulmonary artery filling defects are identified. Central pulmonary arteries are normal caliber. Normal heart size. No pericardial effusion. The aortic root is suboptimally assessed given cardiac pulsation artifact. The aorta is normal caliber. No acute luminal abnormality of the imaged aorta. No periaortic stranding or hemorrhage. Shared origin of the brachiocephalic and left common carotid arteries. Proximal great vessels are free of acute abnormality. No major venous abnormalities are seen. Mediastinum/Nodes: Stable appearance of a 1.5 cm right hilar lymph node, not significantly changed from most recent comparison (4/44). Additional stable right infrahilar lymph node measuring 10 mm (4/52). No new or enlarged adenopathy is seen. Several additional scattered subcentimeter mediastinal and hilar nodes are present, favored to be reactive. Patulous, fluid-filled thoracic esophagus to the level of the thoracic inlet. Posterior bowing of the trachea compatible with imaging during exhalation. Mild central airways thickening, as described below. Thyroid gland and thoracic inlet are unremarkable. Lungs/Pleura: Diffusely thickened central airways with scattered secretions in several more fluid-filled airways particularly in the right middle and lower lobe. Multifocal areas of mixed reticulonodular and patchy ground-glass opacities throughout both lungs, such an appearance and distribution is quite similar to the comparison exam. No pneumothorax. No visible layering pleural effusion. Mild paraseptal emphysematous changes. Some interlobular septal thickening and vascular redistribution could reflect mild superimposed interstitial edematous change. Upper Abdomen: Heterogeneously enhancing spleen is likely related to contrast timing. No acute abnormalities present in the visualized portions of the upper abdomen. Minimal upper abdominal atherosclerosis.  Musculoskeletal: Degenerative changes are present in the imaged spine and shoulders. No acute osseous abnormality or suspicious osseous lesion. No worrisome chest wall masses or lesions. Review of the MIP images confirms the above findings. IMPRESSION: 1. No evidence of acute pulmonary artery filling defects. 2. Multifocal areas of mixed reticulonodular and patchy ground-glass opacities throughout both lungs, on a background of diffusely fluid-filled and thickened airways. Appearance could reflect atypical infection or sequela of chronic and ongoing aspiration particularly given the presence of the patulous fluid-filled thoracic esophagus. 3. Some interlobular septal thickening and vascular redistribution could reflect mild superimposed interstitial edematous change. 4. Stable appearance of enlarged right hilar lymph nodes, likely reactive and or edematous. 5. Emphysema (ICD10-J43.9). 6.  Aortic Atherosclerosis (ICD10-I70.0). Electronically Signed   By: Kreg ShropshirePrice  DeHay M.D.   On: 11/05/2020 05:39   DG Chest Portable 1 View  Result Date: 11/05/2020 CLINICAL DATA:  Hypoxia EXAM: PORTABLE CHEST 1 VIEW COMPARISON:  10/24/2020 FINDINGS: The heart size and mediastinal contours are within normal limits. Both lungs are clear. The visualized skeletal structures are unremarkable. IMPRESSION: No active disease. Electronically Signed   By: Deatra RobinsonKevin  Herman M.D.   On: 11/05/2020 03:28   DG Chest Port 1 View  Result Date: 10/10/2020 CLINICAL DATA:  Hypoxia.  Respiratory failure. EXAM: PORTABLE CHEST 1 VIEW COMPARISON:  07/08/2021. FINDINGS: Heart size normal. Persistent right mid lung and base infiltrate most consistent with pneumonia. No interim change. No pleural effusion or pneumothorax. Degenerative change thoracic spine. IMPRESSION: Persistent right mid lung and base infiltrate most consistent with pneumonia. No interim change. Electronically Signed   By: Maisie Fushomas  Register   On: 10/10/2020 11:24   DG Swallowing  Func-Speech Pathology  Result Date: 11/05/2020 Objective Swallowing Evaluation: Type of Study: MBS-Modified Barium Swallow Study  Patient Details Name: Raelyn EnsignJohn G Wilmot MRN:  [id number] Date of Birth: 19-Feb-1961 Today's Date: 11/05/2020 Time: SLP Start Time (ACUTE ONLY): 1235 -SLP Stop Time (ACUTE ONLY): 1305 SLP Time Calculation (min) (ACUTE ONLY): 30 min Past Medical History: Past Medical History: Diagnosis Date . Anxiety  . Aspiration pneumonia (HCC) 05/26/2018  RLL . Chronic pain  . Closed fracture of xiphoid process  . Esophageal stricture  . GERD (gastroesophageal reflux disease)  . 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  Marland Kitchen Opiate abuse, continuous (HCC)  . Pulmonary edema 02/02/2018 . Sepsis due to pneumonia (HCC) 03/11/2019 . Sleep apnea   CPAP nightly Past Surgical History: Past Surgical History: Procedure Laterality Date . BIOPSY  03/18/2018  Procedure: BIOPSY;  Surgeon: Malissa Hippo, MD;  Location: [facility];  Service: Endoscopy;;  gastric  . COLONOSCOPY N/A 11/22/2013  Dr. Karilyn Cota: Normal except for hemorrhoids.  Next colonoscopy 10 years. . ESOPHAGEAL DILATION N/A 03/18/2018  Procedure: ESOPHAGEAL DILATION;  Surgeon: Malissa Hippo, MD;  [facility]: [facility];  Service: Endoscopy;  Laterality: N/A; . ESOPHAGEAL DILATION N/A 08/07/2020  Procedure: ESOPHAGEAL DILATION;  Surgeon: Malissa Hippo, MD;  [facility];  Service: Endoscopy;  Laterality: N/A; . ESOPHAGEAL MANOMETRY N/A 06/15/2018  Procedure: ESOPHAGEAL MANOMETRY (EM);  Surgeon: Napoleon Form, MD;  Location: [facility];  Service: Endoscopy;  Laterality: N/A; . ESOPHAGOGASTRODUODENOSCOPY (EGD) WITH PROPOFOL N/A 03/18/2018  Dr. Karilyn Cota: Benign-appearing mild esophageal stenosis proximal to the GE J, status post dilation.  2 cm hiatal hernia.  Gastritis, biopsies negative for H. pylori . ESOPHAGOGASTRODUODENOSCOPY (EGD) WITH PROPOFOL N/A 08/07/2020  Procedure: ESOPHAGOGASTRODUODENOSCOPY (EGD) WITH PROPOFOL;  Surgeon:  Malissa Hippo, MD;  [facility];  Service: Endoscopy;  Laterality: N/A; . Fracture Right Leg    Patient has rod/screws in this leg . ORIF CALCANEOUS FRACTURE Left 07/04/2020  Procedure: OPEN REDUCTION INTERNAL FIXATION (ORIF) LEFT TALUS AND CALCANEOUS FRACTURE, SUBTALAR JOINT ARTHROTOMY WITH REMOVAL OF LOOSE BODIES, TALONAVICULAR JOINT ARTHOTOMY WITH REMOVAL OF LOOSE BODIES;  Surgeon: Terance Hart, MD;  Location: [facility];  Service: Orthopedics;  Laterality: Left; . Rotor Cuff  2012  Right Shoulder HPI: Jamarcus Laduke Clasby is a 60 y.o. male with a history of recurrent aspiration pneumonia due to esophageal anatomical issue here with increased work of breathing and hypoxia with concern for aspiration again.  States he developed difficulty breathing over the past 2 days.  Wife checked his oxygen level at home and it was 68%.  Was 81% in triage.  He is on 6 L now at 96%.  He does feel better.  Does not wear oxygen at home. He has felt warm but denies any fever.  Denies any chest pain, abdominal pain.  Has had a few issues of vomiting. He underwent a barium swallow yesterday which showed Esophagus is negative for stricture, ulceration or diverticula. Residual contrast visualized in the lower esophagus at 2 minutes and 5 minutes. No hiatal hernia is seen. Dysphagia evaluation requested.  Subjective: "I don't know why we are doing this." Assessment / Plan / Recommendation [facility] IP CLINICAL IMPRESSIONS 11/05/2020 Clinical Impression Pt presents with primary esophageal dysphagia which negatively impacts pharyngeal phase. Pt with premature spillage of liquids to the pyriforms with swallow initiation at the level of the pyriforms, no penetration or aspiration. Pt with trace pyriform residuals post swallow with liquids and puree. Pt with adequate hyolaryngeal excursion and tongue base retraction. He required two swallows to move the barium tablet taken with cup sip thin out of the  oral cavity. Esophageal sweep  revealed standing column of barium with bird peak appearance near GEJ even after thin liquids and puree only. Retention of food/barium increased throughout the study with eventual backflow of barium into the cervical esophagus. Pt has a follow up appointment in April per his report with the GI team at [facility] for consideration of possible Botox. Pt may be best to adhere to liquids only until he is seen. Pt is at risk for post prandial aspiration due to esophageal retention across textures and consistencies. Pt was encouraged to sit fully upright for all eating and drinking and remain upright for greater than 30 minutes post meals. He should also elevate the head of his bed (he only uses extra pillows at this time). SLP showed the imaging from the MB[facility] to Pt and reviewed esophageal swallow precautions. SLP will follow up x1 during acute stay to reinforce recommendations from today. MD, please see imaging attached in Image section. SLP Visit Diagnosis Dysphagia, pharyngoesophageal phase (R13.14) Attention and concentration deficit following -- Frontal lobe and executive function deficit following -- Impact on safety and function Moderate aspiration risk   [facility] IP TREATMENT RECOMMENDATION 11/05/2020 Treatment Recommendations Therapy as outlined in treatment plan below   Prognosis 11/05/2020 Prognosis for Safe Diet Advancement Guarded Barriers to Reach Goals (No Data) Barriers/Prognosis Comment -- [facility] IP DIET RECOMMENDATION 11/05/2020 SLP Diet Recommendations (No Data) Liquid Administration via Cup;Straw Medication Administration Whole meds with liquid Compensations -- Postural Changes Remain semi-upright after after feeds/meals (Comment);Seated upright at 90 degrees   [facility] IP OTHER RECOMMENDATIONS 11/05/2020 Recommended Consults Consider esophageal assessment;Consider GI evaluation Oral Care Recommendations Oral care BID;Patient independent with oral care Other Recommendations Clarify dietary restrictions   [facility] IP FOLLOW UP  RECOMMENDATIONS 11/05/2020 Follow up Recommendations None   [facility] IP FREQUENCY AND DURATION 11/05/2020 Speech Therapy Frequency (ACUTE ONLY) min 1 x/week Treatment Duration 1 week      [facility] IP ORAL PHASE 11/05/2020 Oral Phase Impaired Oral - Pudding Teaspoon -- Oral - Pudding Cup -- Oral - Honey Teaspoon -- Oral - Honey Cup -- Oral - Nectar Teaspoon -- Oral - Nectar Cup -- Oral - Nectar Straw -- Oral - Thin Teaspoon Premature spillage Oral - Thin Cup Premature spillage Oral - Thin Straw -- Oral - Puree -- Oral - Mech Soft -- Oral - Regular -- Oral - Multi-Consistency -- Oral - Pill -- Oral Phase - Comment --  [facility] IP PHARYNGEAL PHASE 11/05/2020 Pharyngeal Phase Impaired Pharyngeal- Pudding Teaspoon -- Pharyngeal -- Pharyngeal- Pudding Cup -- Pharyngeal -- Pharyngeal- Honey Teaspoon -- Pharyngeal -- Pharyngeal- Honey Cup -- Pharyngeal -- Pharyngeal- Nectar Teaspoon -- Pharyngeal -- Pharyngeal- Nectar Cup -- Pharyngeal -- Pharyngeal- Nectar Straw -- Pharyngeal -- Pharyngeal- Thin Teaspoon Delayed swallow initiation-pyriform sinuses;Pharyngeal residue - pyriform Pharyngeal -- Pharyngeal- Thin Cup Delayed swallow initiation-pyriform sinuses;Pharyngeal residue - pyriform Pharyngeal -- Pharyngeal- Thin Straw Pharyngeal residue - pyriform Pharyngeal -- Pharyngeal- Puree Delayed swallow initiation-vallecula;Pharyngeal residue - pyriform Pharyngeal -- Pharyngeal- Mechanical Soft -- Pharyngeal -- Pharyngeal- Regular Delayed swallow initiation-vallecula Pharyngeal -- Pharyngeal- Multi-consistency -- Pharyngeal -- Pharyngeal- Pill WFL Pharyngeal -- Pharyngeal Comment --  [facility] IP CERVICAL ESOPHAGEAL PHASE 11/05/2020 Cervical Esophageal Phase Impaired Pudding Teaspoon -- Pudding Cup -- Honey Teaspoon -- Honey Cup -- Nectar Teaspoon -- Nectar Cup -- Nectar Straw -- Thin Teaspoon -- Thin Cup -- Thin Straw -- Puree Esophageal backflow into cervical esophagus Mechanical Soft -- Regular -- Multi-consistency -- Pill -- Cervical Esophageal  Comment full standing column of barium  throughout the study with bird peak appearance near GEJ, barium backflow to the UES but did not go into pharynx Thank you, Havery Moros, CCC-SLP [phone] PORTER,DABNEY 11/05/2020, 5:01 PM                Microbiology: Recent Results (from the past 240 hour(s))  Blood Culture (routine x 2)     Status: None (Preliminary result)   Collection Time: 11/05/20  3:11 AM   Specimen: BLOOD  Result Value Ref Range Status   Specimen Description BLOOD LEFT ANTECUBITAL  Final   Special Requests   Final    BOTTLES DRAWN AEROBIC AND ANAEROBIC Blood Culture adequate volume   Culture   Final    NO GROWTH 3 DAYS Performed at [facility], [address], [place], [place] [address]    Report Status PENDING  Incomplete  Resp Panel by RT-PCR (Flu A&B, Covid) Nasopharyngeal Swab     Status: None   Collection Time: 11/05/20  3:16 AM   Specimen: Nasopharyngeal Swab; Nasopharyngeal(NP) swabs in vial transport medium  Result Value Ref Range Status   SARS Coronavirus 2 by RT PCR NEGATIVE NEGATIVE Final    Comment: (NOTE) SARS-CoV-2 target nucleic acids are NOT DETECTED.  The SARS-CoV-2 RNA is generally detectable in upper respiratory specimens during the acute phase of infection. The lowest concentration of SARS-CoV-2 viral copies this assay can detect is 138 copies/mL. A negative result does not preclude SARS-Cov-2 infection and should not be used as the sole basis for treatment or other patient management decisions. A negative result may occur with  improper specimen collection/handling, submission of specimen other than nasopharyngeal swab, presence of viral mutation(s) within the areas targeted by this assay, and inadequate number of viral copies(<138 copies/mL). A negative result must be combined with clinical observations, patient history, and epidemiological information. The expected result is Negative.  Fact Sheet for Patients:   BloggerCourse.com  Fact Sheet for Healthcare Providers:  SeriousBroker.it  This test is no t yet approved or cleared by the [place] FDA and  has been authorized for detection and/or diagnosis of SARS-CoV-2 by FDA under an Emergency Use Authorization (EUA). This EUA will remain  in effect (meaning this test can be used) for the duration of the COVID-19 declaration under Section 564(b)(1) of the Act, 21 U.S.C.section 360bbb-3(b)(1), unless the authorization is terminated  or revoked sooner.       Influenza A by PCR NEGATIVE NEGATIVE Final   Influenza B by PCR NEGATIVE NEGATIVE Final    Comment: (NOTE) The Xpert Xpress SARS-CoV-2/FLU/RSV plus assay is intended as an aid in the diagnosis of influenza from Nasopharyngeal swab specimens and should not be used as a sole basis for treatment. Nasal washings and aspirates are unacceptable for Xpert Xpress SARS-CoV-2/FLU/RSV testing.  Fact Sheet for Patients: BloggerCourse.com  Fact Sheet for Healthcare Providers: SeriousBroker.it  This test is not yet approved or cleared by the [place] FDA and has been authorized for detection and/or diagnosis of SARS-CoV-2 by FDA under an Emergency Use Authorization (EUA). This EUA will remain in effect (meaning this test can be used) for the duration of the COVID-19 declaration under Section 564(b)(1) of the Act, 21 U.S.C. section 360bbb-3(b)(1), unless the authorization is terminated or revoked.  Performed at [facility], [address] [place], [place] [address]   Blood Culture (routine x 2)     Status: None (Preliminary result)   Collection Time: 11/05/20  5:02 AM   Specimen: BLOOD  Result Value Ref Range  Status   Specimen Description BLOOD BLOOD RIGHT HAND  Final   Special Requests   Final    Blood Culture adequate volume BOTTLES DRAWN AEROBIC AND ANAEROBIC   Culture   Final     NO GROWTH 3 DAYS Performed at [facility], [address], [place], [place] [address]    Report Status PENDING  Incomplete  Urine culture     Status: None   Collection Time: 11/05/20  7:30 AM   Specimen: Urine, Random  Result Value Ref Range Status   Specimen Description   Final    URINE, RANDOM Performed at [facility], [address] [place], [place] [address]    Special Requests   Final    NONE Performed at [facility], [address] [place], [place] [address]    Culture   Final    NO GROWTH Performed at [facility], [address], [place], [place] [address]    Report Status 11/06/2020 FINAL  Final  MRSA PCR Screening     Status: None   Collection Time: 11/05/20  1:45 PM   Specimen: Nasal Mucosa; Nasopharyngeal  Result Value Ref Range Status   MRSA by PCR NEGATIVE NEGATIVE Final    Comment:        The GeneXpert MRSA Assay (FDA approved for NASAL specimens only), is one component of a comprehensive MRSA colonization surveillance program. It is not intended to diagnose MRSA infection nor to guide or monitor treatment for MRSA infections. Performed at [facility], [address] [place], [place] [address]      Labs: Basic Metabolic Panel: Recent Labs  Lab 11/05/20 [phone] 11/06/20 0629 11/07/20 0625 11/08/20 0440  NA 140 139 139 142  K 3.5 4.2 3.9 3.8  CL 102 102 100 102  CO2 29 27 32 31  GLUCOSE 107* 116* 117* 118*  BUN 8 7 8 9   CREATININE 0.84 0.77 0.84 0.74  CALCIUM 8.7* 8.6* 8.6* 8.6*  MG  --   --  2.1  --    Liver Function Tests: Recent Labs  Lab 11/05/20 0311  AST 19  ALT 17  ALKPHOS 71  BILITOT 0.3  PROT 7.8  ALBUMIN 4.3   No results for input(s): LIPASE, AMYLASE in the last 168 hours. No results for input(s): AMMONIA in the last 168 hours. CBC: Recent Labs  Lab 11/05/20 0311 11/06/20 1344 11/07/20 0625 11/08/20 0440  WBC 8.3 6.2 4.5 3.7*  NEUTROABS 5.6  --   --   --   HGB 13.8 11.9* 11.5* 11.4*  HCT 43.1 37.0* 36.6*  35.6*  MCV 91.3 91.8 91.0 90.4  PLT 173 124* 130* 157   Cardiac Enzymes: No results for input(s): CKTOTAL, CKMB, CKMBINDEX, TROPONINI in the last 168 hours. BNP: Invalid input(s): POCBNP CBG: Recent Labs  Lab 11/05/20 0843 11/06/20 0735 11/07/20 0752 11/08/20 0740  GLUCAP 115* 114* 125* 119*    Time coordinating discharge:  36 minutes  Signed:  11/10/20, DO [facility] Pager: [phone] 11/08/2020, 10:36 AM

## 2020-11-10 LAB — CULTURE, BLOOD (ROUTINE X 2)
Culture: NO GROWTH
Culture: NO GROWTH
Special Requests: ADEQUATE
Special Requests: ADEQUATE

## 2020-11-24 ENCOUNTER — Emergency Department (HOSPITAL_COMMUNITY)
Admission: EM | Admit: 2020-11-24 | Discharge: 2020-11-24 | Disposition: A | Discharge status: Home / Self Care | Payer: 59

## 2020-11-24 ENCOUNTER — Other Ambulatory Visit: Payer: Self-pay

## 2020-12-02 ENCOUNTER — Telehealth (INDEPENDENT_AMBULATORY_CARE_PROVIDER_SITE_OTHER): Payer: Self-pay

## 2020-12-02 NOTE — Telephone Encounter (Signed)
Patient called and states he was having a lot of issues with heartburn and indigestion on Saturday. He thinks related to stress. He states it has subsided and turned down an office visit. I advised to call back if he needs [place].

## 2020-12-16 DIAGNOSIS — J690 Pneumonitis due to inhalation of food and vomit: Secondary | ICD-10-CM

## 2020-12-17 ENCOUNTER — Other Ambulatory Visit: Payer: Self-pay | Admitting: Internal Medicine

## 2020-12-19 ENCOUNTER — Emergency Department (HOSPITAL_COMMUNITY): Payer: 59

## 2020-12-19 ENCOUNTER — Encounter: Payer: Self-pay | Admitting: Internal Medicine

## 2020-12-19 ENCOUNTER — Other Ambulatory Visit: Payer: Self-pay

## 2020-12-19 ENCOUNTER — Inpatient Hospital Stay (HOSPITAL_COMMUNITY)
Admission: EM | Admit: 2020-12-19 | Discharge: 2020-12-21 | DRG: 871 | Discharge status: Against Medical Advice | Payer: 59 | Attending: Internal Medicine | Admitting: Internal Medicine

## 2020-12-19 ENCOUNTER — Encounter (HOSPITAL_COMMUNITY): Payer: Self-pay

## 2020-12-19 ENCOUNTER — Ambulatory Visit (INDEPENDENT_AMBULATORY_CARE_PROVIDER_SITE_OTHER): Payer: 59 | Admitting: Internal Medicine

## 2020-12-19 VITALS — HR 99 | Temp 99.8°F

## 2020-12-19 DIAGNOSIS — Z8042 Family history of malignant neoplasm of prostate: Secondary | ICD-10-CM

## 2020-12-19 DIAGNOSIS — Z5329 Procedure and treatment not carried out because of patient's decision for other reasons: Secondary | ICD-10-CM | POA: Diagnosis present

## 2020-12-19 DIAGNOSIS — K2289 Other specified disease of esophagus: Secondary | ICD-10-CM

## 2020-12-19 DIAGNOSIS — E039 Hypothyroidism, unspecified: Secondary | ICD-10-CM | POA: Diagnosis present

## 2020-12-19 DIAGNOSIS — Z79890 Hormone replacement therapy (postmenopausal): Secondary | ICD-10-CM

## 2020-12-19 DIAGNOSIS — Z823 Family history of stroke: Secondary | ICD-10-CM

## 2020-12-19 DIAGNOSIS — F419 Anxiety disorder, unspecified: Secondary | ICD-10-CM | POA: Diagnosis present

## 2020-12-19 DIAGNOSIS — A419 Sepsis, unspecified organism: Principal | ICD-10-CM

## 2020-12-19 DIAGNOSIS — Z20822 Contact with and (suspected) exposure to covid-19: Secondary | ICD-10-CM | POA: Diagnosis present

## 2020-12-19 DIAGNOSIS — Z7982 Long term (current) use of aspirin: Secondary | ICD-10-CM

## 2020-12-19 DIAGNOSIS — J690 Pneumonitis due to inhalation of food and vomit: Secondary | ICD-10-CM | POA: Diagnosis present

## 2020-12-19 DIAGNOSIS — R739 Hyperglycemia, unspecified: Secondary | ICD-10-CM | POA: Diagnosis present

## 2020-12-19 DIAGNOSIS — J9601 Acute respiratory failure with hypoxia: Secondary | ICD-10-CM | POA: Diagnosis present

## 2020-12-19 DIAGNOSIS — Z803 Family history of malignant neoplasm of breast: Secondary | ICD-10-CM

## 2020-12-19 DIAGNOSIS — Z8052 Family history of malignant neoplasm of bladder: Secondary | ICD-10-CM

## 2020-12-19 DIAGNOSIS — J189 Pneumonia, unspecified organism: Secondary | ICD-10-CM | POA: Diagnosis present

## 2020-12-19 DIAGNOSIS — K220 Achalasia of cardia: Secondary | ICD-10-CM | POA: Diagnosis present

## 2020-12-19 DIAGNOSIS — Z8249 Family history of ischemic heart disease and other diseases of the circulatory system: Secondary | ICD-10-CM

## 2020-12-19 DIAGNOSIS — R6520 Severe sepsis without septic shock: Secondary | ICD-10-CM | POA: Diagnosis present

## 2020-12-19 DIAGNOSIS — E872 Acidosis: Secondary | ICD-10-CM | POA: Diagnosis present

## 2020-12-19 DIAGNOSIS — G4733 Obstructive sleep apnea (adult) (pediatric): Secondary | ICD-10-CM | POA: Diagnosis present

## 2020-12-19 DIAGNOSIS — Z800 Family history of malignant neoplasm of digestive organs: Secondary | ICD-10-CM

## 2020-12-19 DIAGNOSIS — K222 Esophageal obstruction: Secondary | ICD-10-CM

## 2020-12-19 DIAGNOSIS — K224 Dyskinesia of esophagus: Secondary | ICD-10-CM | POA: Diagnosis present

## 2020-12-19 DIAGNOSIS — F32A Depression, unspecified: Secondary | ICD-10-CM | POA: Diagnosis present

## 2020-12-19 DIAGNOSIS — K219 Gastro-esophageal reflux disease without esophagitis: Secondary | ICD-10-CM | POA: Diagnosis present

## 2020-12-19 DIAGNOSIS — Z79899 Other long term (current) drug therapy: Secondary | ICD-10-CM

## 2020-12-19 DIAGNOSIS — Z841 Family history of disorders of kidney and ureter: Secondary | ICD-10-CM

## 2020-12-19 MED ORDER — LACTATED RINGERS IV SOLN: INTRAVENOUS | Status: AC

## 2020-12-19 MED ORDER — VANCOMYCIN HCL IN DEXTROSE 1-5 GM/200ML-% IV SOLN
1000.0000 mg | Freq: Once | INTRAVENOUS | Status: AC
  Administered 2020-12-20: 1000 mg via INTRAVENOUS
  Filled 2020-12-19: qty 200

## 2020-12-19 MED ORDER — LACTATED RINGERS IV BOLUS (SEPSIS)
1000.0000 mL | Freq: Once | INTRAVENOUS | Status: AC
  Administered 2020-12-20: 1000 mL via INTRAVENOUS

## 2020-12-19 MED ORDER — SODIUM CHLORIDE 0.9 % IV SOLN
2.0000 g | Freq: Once | INTRAVENOUS | Status: AC
  Administered 2020-12-20: 2 g via INTRAVENOUS
  Filled 2020-12-19: qty 2

## 2020-12-19 NOTE — ED Notes (Signed)
EMS reported that pt walked out of their view while on scene and concerned that pt may have taken medication.

## 2020-12-19 NOTE — Sepsis Progress Note (Signed)
Monitoring for code sepsis protocol. 

## 2020-12-19 NOTE — ED Provider Notes (Signed)
[facility] EMERGENCY DEPARTMENT Provider Note   CSN: [id number] Arrival date & time: 12/19/20  2250     History Chief Complaint  Patient presents with  . Shortness of Breath    Christian Ellison is a 60 y.o. male.  Level 5 caveat of respiratory distress.  Patient arrives via EMS with hypoxia, shortness of breath and fever.  History of recurrent aspiration pneumonia secondary to esophageal dysmotility issues. Patient with a history of recurrent aspiration pneumonia due to esophageal anatomical issue here with increased work of breathing and hypoxia with concern for aspiration again.  States he developed difficulty breathing over the past 2 days. Febrile to 103 on arrival, tachypneic to 30s, O2 saturation was 84% at home.  He does not wear oxygen.  He denies any chest pain.  States his cough is productive of clear to white mucus.  Denies any abdominal pain, nausea or vomiting.  No leg pain or leg swelling.  The history is provided by the patient and the EMS personnel. The history is limited by the condition of the patient.  Shortness of Breath Associated symptoms: cough and fever   Associated symptoms: no abdominal pain, no chest pain, no headaches, no rash and no vomiting        Past Medical History:  Diagnosis Date  . Anxiety   . Aspiration pneumonia (HCC) 05/26/2018   RLL  . Chronic pain   . Closed fracture of xiphoid process   . Esophageal stricture   . GERD (gastroesophageal reflux disease)   . 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   Marland Kitchen Opiate abuse, continuous (HCC)   . Pulmonary edema 02/02/2018  . Sepsis due to pneumonia (HCC) 03/11/2019  . Sleep apnea    CPAP nightly    Patient Active Problem List   Diagnosis Date Noted  . Recurrent aspiration pneumonia (HCC) 12/16/2020  . Acute respiratory failure with hypoxia (HCC) 11/05/2020  . Hypoxia 11/05/2020  . Pneumonia 10/09/2020  . GERD (gastroesophageal reflux disease)   . Esophageal motility disorder    . Alcohol abuse, in remission 08/30/2020  . Gastroesophageal reflux disease with esophagitis 08/30/2020  . Moderate recurrent major depression (HCC) 08/30/2020  . Preoperative evaluation of a medical condition to rule out surgical contraindications (TAR required) 08/30/2020  . Anxiety 06/12/2020  . Benzodiazepine dependence (HCC) 06/12/2020  . History of substance abuse (HCC) 06/12/2020  . Obstructive sleep apnea 06/12/2020  . Hypothyroidism 03/11/2019  . Essential hypertension 02/02/2018  . CHF (congestive heart failure) (HCC) 02/02/2018  . Esophageal dysphagia 02/02/2018  . Esophageal stricture/stenosis/esophageal dysmotility with recurrent aspiration 02/02/2018  . Esophageal stenosis 02/02/2018  . History of hepatitis C 01/17/2013  . Depression 01/17/2013  . Anxiety with depression 01/17/2013    Past Surgical History:  Procedure Laterality Date  . BIOPSY  03/18/2018   Procedure: BIOPSY;  Surgeon: Malissa Hippo, MD;  [facility]: [facility];  Service: Endoscopy;;  gastric   . COLONOSCOPY N/A 11/22/2013   Dr. Karilyn Cota: Normal except for hemorrhoids.  Next colonoscopy 10 years.  . ESOPHAGEAL DILATION N/A 03/18/2018   Procedure: ESOPHAGEAL DILATION;  Surgeon: Malissa Hippo, MD;  [facility]: [facility];  Service: Endoscopy;  Laterality: N/A;  . ESOPHAGEAL DILATION N/A 08/07/2020   Procedure: ESOPHAGEAL DILATION;  Surgeon: Malissa Hippo, MD;  [facility]: [facility];  Service: Endoscopy;  Laterality: N/A;  . ESOPHAGEAL MANOMETRY N/A 06/15/2018   Procedure: ESOPHAGEAL MANOMETRY (EM);  Surgeon: Napoleon Form, MD;  Location: [facility];  Service: Endoscopy;  Laterality: N/A;  . ESOPHAGOGASTRODUODENOSCOPY (EGD) WITH PROPOFOL N/A 03/18/2018   Dr. Karilyn Cota: Benign-appearing mild esophageal stenosis proximal to the GE J, status post dilation.  2 cm hiatal hernia.  Gastritis, biopsies negative for H. pylori  . ESOPHAGOGASTRODUODENOSCOPY (EGD) WITH PROPOFOL N/A 08/07/2020   Procedure:  ESOPHAGOGASTRODUODENOSCOPY (EGD) WITH PROPOFOL;  Surgeon: Malissa Hippo, MD;  [facility];  Service: Endoscopy;  Laterality: N/A;  . Fracture Right Leg     Patient has rod/screws in this leg  . ORIF CALCANEOUS FRACTURE Left 07/04/2020   Procedure: OPEN REDUCTION INTERNAL FIXATION (ORIF) LEFT TALUS AND CALCANEOUS FRACTURE, SUBTALAR JOINT ARTHROTOMY WITH REMOVAL OF LOOSE BODIES, TALONAVICULAR JOINT ARTHOTOMY WITH REMOVAL OF LOOSE BODIES;  Surgeon: Terance Hart, MD;  Location: [facility];  Service: Orthopedics;  Laterality: Left;  . Rotor Cuff  2012   Right Shoulder       Family History  Problem Relation Age of Onset  . Breast cancer Mother   . Kidney failure Mother   . Stroke Mother   . Colon cancer Father   . Bladder Cancer Father   . Prostate cancer Father   . Hypertension Sister   . Hypothyroidism Sister   . Healthy Sister   . Healthy Daughter   . Healthy Son   . Healthy Son     Social History   Tobacco Use  . Smoking status: Never Smoker  . Smokeless tobacco: Never Used  Vaping Use  . Vaping Use: Never used  Substance Use Topics  . Alcohol use: Not Currently  . Drug use: Not Currently    Types: Benzodiazepines    Comment: opiates    Home Medications Prior to Admission medications   Medication Sig Start Date End Date Taking? Authorizing Provider  acetaminophen (TYLENOL) 325 MG tablet Take 2 tablets (650 mg total) by mouth every 6 (six) hours as needed for mild pain (or Fever >/= 101). 05/17/19   Shon Hale, MD  albuterol (VENTOLIN HFA) 108 (90 Base) MCG/ACT inhaler Inhale 2 puffs into the lungs every 6 (six) hours as needed for wheezing or shortness of breath. 09/09/20   Anabel Halon, MD  ALPRAZolam Prudy Feeler) 1 MG tablet Take 1 mg by mouth every 6 (six) hours as needed for anxiety.  10/22/19   [provider]  amoxicillin-clavulanate (AUGMENTIN) 875-125 MG tablet Take 1 tablet by mouth every 12 (twelve) hours. 11/08/20   Catarina Hartshorn, MD   aspirin EC 81 MG tablet Take 1 tablet (81 mg total) by mouth daily with breakfast. Patient taking differently: Take 81 mg by mouth at bedtime. 03/13/19   Shon Hale, MD  Cimetidine (TAGAMET PO) Take 75 mg by mouth daily as needed (if he feels his food coming up after meal).    [provider]  diltiazem (DILACOR XR) 120 MG 24 hr capsule Take 1 capsule (120 mg total) by mouth daily. 08/17/20   Danford, Earl Lites, MD  Glucosamine HCl 1000 MG TABS Take 1,000 mg by mouth at bedtime.    [provider]  levothyroxine (SYNTHROID) 25 MCG tablet TAKE 1 TABLET BY MOUTH EVERY DAY 12/17/20   Anabel Halon, MD  Multiple Vitamin (MULTIVITAMIN WITH MINERALS) TABS tablet Take 1 tablet by mouth daily. 50+    [provider]  pantoprazole (PROTONIX) 40 MG tablet Take 1 tablet (40 mg total) by mouth 2 (two) times daily. 08/07/20 08/07/21  Shon Hale, MD    Allergies    Patient  has no known allergies.  Review of Systems   Review of Systems  Constitutional: Positive for activity change, appetite change and fever.  HENT: Positive for rhinorrhea.   Respiratory: Positive for cough, chest tightness and shortness of breath.   Cardiovascular: Negative for chest pain.  Gastrointestinal: Negative for abdominal pain, nausea and vomiting.  Genitourinary: Negative for dysuria, hematuria and penile swelling.  Musculoskeletal: Negative for arthralgias and myalgias.  Skin: Negative for rash.  Neurological: Positive for weakness. Negative for headaches.   all other systems are negative except as noted in the HPI and PMH.    Physical Exam Updated Vital Signs BP (!) 179/79   Pulse (!) 117   Temp (!) 103.3 F (39.6 C)   Resp (!) 32   Ht 6' (1.829 m)   Wt 91.6 kg   SpO2 95%   BMI 27.40 kg/m   Physical Exam Vitals and nursing note reviewed.  Constitutional:      General: He is in acute distress.     Appearance: He is well-developed. He is ill-appearing.     Comments:  Speaking in short phrases, increased work of breathing  HENT:     Head: Normocephalic and atraumatic.     Mouth/Throat:     Pharynx: No oropharyngeal exudate.  Eyes:     Conjunctiva/sclera: Conjunctivae normal.     Pupils: Pupils are equal, round, and reactive to light.  Neck:     Comments: No meningismus. Cardiovascular:     Rate and Rhythm: Regular rhythm. Tachycardia present.     Heart sounds: Normal heart sounds. No murmur heard.   Pulmonary:     Effort: Respiratory distress present.     Breath sounds: Rhonchi present.     Comments: Diminished breath sounds Abdominal:     Palpations: Abdomen is soft.     Tenderness: There is no abdominal tenderness. There is no guarding or rebound.  Musculoskeletal:        General: No tenderness. Normal range of motion.     Cervical back: Normal range of motion and neck supple.  Skin:    General: Skin is warm.  Neurological:     Mental Status: He is alert and oriented to person, place, and time.     Cranial Nerves: No cranial nerve deficit.     Motor: No abnormal muscle tone.     Coordination: Coordination normal.     Comments: No ataxia on finger to nose bilaterally. No pronator drift. 5/5 strength throughout. CN 2-12 intact.Equal grip strength. Sensation intact.   Psychiatric:        Behavior: Behavior normal.     ED Results / Procedures / Treatments   Labs (all labs ordered are listed, but only abnormal results are displayed) Labs Reviewed  LACTIC ACID, PLASMA - Abnormal; Notable for the following components:      Result Value   Lactic Acid, Venous 3.0 (*)    All other components within normal limits  LACTIC ACID, PLASMA - Abnormal; Notable for the following components:   Lactic Acid, Venous 2.1 (*)    All other components within normal limits  COMPREHENSIVE METABOLIC PANEL - Abnormal; Notable for the following components:   Glucose, Bld 122 (*)    Calcium 8.7 (*)    All other components within normal limits  CBC WITH  DIFFERENTIAL/PLATELET - Abnormal; Notable for the following components:   Lymphs Abs 0.6 (*)    All other components within normal limits  APTT - Abnormal; Notable for the following  components:   aPTT 21 (*)    All other components within normal limits  URINALYSIS, ROUTINE W REFLEX MICROSCOPIC - Abnormal; Notable for the following components:   Color, Urine COLORLESS (*)    Specific Gravity, Urine 1.004 (*)    Hgb urine dipstick SMALL (*)    All other components within normal limits  PHOSPHORUS - Abnormal; Notable for the following components:   Phosphorus 1.7 (*)    All other components within normal limits  RESP PANEL BY RT-PCR (FLU A&B, COVID) ARPGX2  CULTURE, BLOOD (ROUTINE X 2)  CULTURE, BLOOD (ROUTINE X 2)  URINE CULTURE  EXPECTORATED SPUTUM ASSESSMENT W GRAM STAIN, RFLX TO RESP C  PROTIME-INR  MAGNESIUM  PROCALCITONIN  LACTIC ACID, PLASMA  LEGIONELLA PNEUMOPHILA SEROGP 1 UR AG  STREP PNEUMONIAE URINARY ANTIGEN  LACTIC ACID, PLASMA    EKG EKG Interpretation  Date/Time:  Friday Dec 20 2020 00:01:00 EDT Ventricular Rate:  108 PR Interval:  139 QRS Duration: 81 QT Interval:  360 QTC Calculation: 483 R Axis:   -24 Text Interpretation: Sinus tachycardia Borderline left axis deviation Borderline T wave abnormalities Borderline prolonged QT interval No significant change was found Confirmed by Glynn Octave [phone]) on 12/20/2020 12:51:21 AM   Radiology DG Chest Port 1 View  Result Date: 12/20/2020 CLINICAL DATA:  Shortness of breath EXAM: PORTABLE CHEST 1 VIEW COMPARISON:  11/05/2020, CT 11/05/2020 FINDINGS: Interval development of patchy airspace disease throughout the left greater than right thorax. No pleural effusion. Normal cardiomediastinal silhouette. No pneumothorax. IMPRESSION: Diffuse left greater than right patchy pulmonary opacity suspect for respiratory infection, potential atypical pneumonia. Electronically Signed   By: Jasmine Pang M.D.   On: 12/20/2020  00:00    Procedures .Critical Care Performed by: Glynn Octave, MD Authorized by: Glynn Octave, MD   Critical care provider statement:    Critical care time (minutes):  45   Critical care was necessary to treat or prevent imminent or life-threatening deterioration of the following conditions:  Sepsis   Critical care was time spent personally by me on the following activities:  Discussions with consultants, evaluation of patient's response to treatment, examination of patient, ordering and performing treatments and interventions, ordering and review of laboratory studies, ordering and review of radiographic studies, pulse oximetry, re-evaluation of patient's condition, obtaining history from patient or surrogate and review of old charts     Medications Ordered in ED Medications  lactated ringers infusion (has no administration in time range)  lactated ringers bolus 1,000 mL (has no administration in time range)  vancomycin (VANCOCIN) IVPB 1000 mg/200 mL premix (has no administration in time range)  ceFEPIme (MAXIPIME) 2 g in sodium chloride 0.9 % 100 mL IVPB (has no administration in time range)    ED Course  I have reviewed the triage vital signs and the nursing notes.  Pertinent labs & imaging results that were available during my care of the patient were reviewed by me and considered in my medical decision making (see chart for details).    MDM Rules/Calculators/A&P                          Recurrent aspiration pneumonia here with hypoxia, fever, tachycardia.  Code sepsis activated on arrival with concern for recurrent aspiration pneumonia with new hypoxia.  Patient given broad-spectrum antibiotics and IV fluids after cultures were obtained.  Lactate is 3.  Fever to 103.3.  Tachycardia and new oxygen requirement.  X-ray concerning for pulmonary  opacities bilaterally.  Suspect aspiration pneumonia again.  Patient given IV fluid resuscitation and broad-spectrum  antibiotics.  He does have a new oxygen requirement and will need admission to the hospital  Lactic elevated, leukocytosis.  Tachycardic with new oxygen requirement.  Heart rate and work of breathing improving.  Admission to the hospital discussed with Dr. Thomes DinningAdefeso.  Final Clinical Impression(s) / ED Diagnoses Final diagnoses:  Sepsis with acute hypoxic respiratory failure without septic shock, due to unspecified organism [facility](HCC)    Rx / DC Orders ED Discharge Orders    None       Edyn Popoca, Jeannett SeniorStephen, MD 12/20/20 [phone]

## 2020-12-19 NOTE — Patient Instructions (Signed)
Please go to ER for urgent imaging and IV antibiotics.

## 2020-12-19 NOTE — ED Triage Notes (Signed)
BIB EMS c/o shortness of breathe x 2 days, worse today. Hx of aspiration PNA.

## 2020-12-19 NOTE — Progress Notes (Signed)
Virtual Visit via Telephone Note   This visit type was conducted due to national recommendations for restrictions regarding the COVID-19 Pandemic (e.g. social distancing) in an effort to limit this patient's exposure and mitigate transmission in our community.  Due to his co-morbid illnesses, this patient is at least at moderate risk for complications without adequate follow up.  This format is felt to be most appropriate for this patient at this time.  The patient did not have access to video technology/had technical difficulties with video requiring transitioning to audio format only (telephone).  All issues noted in this document were discussed and addressed.  No physical exam could be performed with this format.   Evaluation Performed:  Follow-up visit  Date:  12/19/2020   ID:  Christian Ellison, DOB 1960-12-30, MRN [id number]  Patient Location: Home Provider Location: Office/Clinic  Participants: Patient and his wife Location of Patient: Home Location of Provider: Telehealth Consent was obtain for visit to be over via telehealth. I verified that I am speaking with the correct person using two identifiers.  PCP:  Anabel Halon, MD   Chief Complaint:  Dyspnea, cough and fever  History of Present Illness:    Christian Ellison is a 60 y.o. male who has a televisit for c/o cough and dyspnea for last 4-5 days. He also had low grade fever. His wife reports that his O2 sat is 84% at home today. He has been mild confused than baseline at times according to his wife. He has h/o recurrent aspiration pneumonia. He is undergoing GI workup for esophageal dysmotility.  The patient does not have symptoms concerning for COVID-19 infection (fever, chills, cough, or new shortness of breath).   Past Medical, Surgical, Social History, Allergies, and Medications have been Reviewed.  Past Medical History:  Diagnosis Date  . Anxiety   . Aspiration pneumonia (HCC) 05/26/2018   RLL  . Chronic  pain   . Closed fracture of xiphoid process   . Esophageal stricture   . GERD (gastroesophageal reflux disease)   . 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   Marland Kitchen Opiate abuse, continuous (HCC)   . Pulmonary edema 02/02/2018  . Sepsis due to pneumonia (HCC) 03/11/2019  . Sleep apnea    CPAP nightly   Past Surgical History:  Procedure Laterality Date  . BIOPSY  03/18/2018   Procedure: BIOPSY;  Surgeon: Malissa Hippo, MD;  [facility];  Service: Endoscopy;;  gastric   . COLONOSCOPY N/A 11/22/2013   Dr. Karilyn Cota: Normal except for hemorrhoids.  Next colonoscopy 10 years.  . ESOPHAGEAL DILATION N/A 03/18/2018   Procedure: ESOPHAGEAL DILATION;  Surgeon: Malissa Hippo, MD;  [facility];  Service: Endoscopy;  Laterality: N/A;  . ESOPHAGEAL DILATION N/A 08/07/2020   Procedure: ESOPHAGEAL DILATION;  Surgeon: Malissa Hippo, MD;  [facility];  Service: Endoscopy;  Laterality: N/A;  . ESOPHAGEAL MANOMETRY N/A 06/15/2018   Procedure: ESOPHAGEAL MANOMETRY (EM);  Surgeon: Napoleon Form, MD;  Location: [facility];  Service: Endoscopy;  Laterality: N/A;  . ESOPHAGOGASTRODUODENOSCOPY (EGD) WITH PROPOFOL N/A 03/18/2018   Dr. Karilyn Cota: Benign-appearing mild esophageal stenosis proximal to the GE J, status post dilation.  2 cm hiatal hernia.  Gastritis, biopsies negative for H. pylori  . ESOPHAGOGASTRODUODENOSCOPY (EGD) WITH PROPOFOL N/A 08/07/2020   Procedure: ESOPHAGOGASTRODUODENOSCOPY (EGD) WITH PROPOFOL;  Surgeon: Malissa Hippo, MD;  [facility];  Service: Endoscopy;  Laterality: N/A;  .  Fracture Right Leg     Patient has rod/screws in this leg  . ORIF CALCANEOUS FRACTURE Left 07/04/2020   Procedure: OPEN REDUCTION INTERNAL FIXATION (ORIF) LEFT TALUS AND CALCANEOUS FRACTURE, SUBTALAR JOINT ARTHROTOMY WITH REMOVAL OF LOOSE BODIES, TALONAVICULAR JOINT ARTHOTOMY WITH REMOVAL OF LOOSE BODIES;  Surgeon: Terance Hart, MD;  Location: [facility];   Service: Orthopedics;  Laterality: Left;  . Rotor Cuff  2012   Right Shoulder     Current Meds  Medication Sig  . acetaminophen (TYLENOL) 325 MG tablet Take 2 tablets (650 mg total) by mouth every 6 (six) hours as needed for mild pain (or Fever >/= 101).  Marland Kitchen albuterol (VENTOLIN HFA) 108 (90 Base) MCG/ACT inhaler Inhale 2 puffs into the lungs every 6 (six) hours as needed for wheezing or shortness of breath.  . ALPRAZolam (XANAX) 1 MG tablet Take 1 mg by mouth every 6 (six) hours as needed for anxiety.   Marland Kitchen amoxicillin-clavulanate (AUGMENTIN) 875-125 MG tablet Take 1 tablet by mouth every 12 (twelve) hours.  Marland Kitchen aspirin EC 81 MG tablet Take 1 tablet (81 mg total) by mouth daily with breakfast. (Patient taking differently: Take 81 mg by mouth at bedtime.)  . Cimetidine (TAGAMET PO) Take 75 mg by mouth daily as needed (if he feels his food coming up after meal).  Marland Kitchen diltiazem (DILACOR XR) 120 MG 24 hr capsule Take 1 capsule (120 mg total) by mouth daily.  . Glucosamine HCl 1000 MG TABS Take 1,000 mg by mouth at bedtime.  Marland Kitchen levothyroxine (SYNTHROID) 25 MCG tablet TAKE 1 TABLET BY MOUTH EVERY DAY  . Multiple Vitamin (MULTIVITAMIN WITH MINERALS) TABS tablet Take 1 tablet by mouth daily. 50+  . pantoprazole (PROTONIX) 40 MG tablet Take 1 tablet (40 mg total) by mouth 2 (two) times daily.     Allergies:   Patient has no known allergies.   ROS:   Please see the history of present illness.     All other systems reviewed and are negative.   Labs/Other Tests and Data Reviewed:    Recent Labs: 08/30/2020: TSH 4.330 11/05/2020: ALT 17; B Natriuretic Peptide 35.0 11/07/2020: Magnesium 2.1 11/08/2020: BUN 9; Creatinine, Ser 0.74; Hemoglobin 11.4; Platelets 157; Potassium 3.8; Sodium 142   Recent Lipid Panel Lab Results  Component Value Date/Time   TRIG 95 01/02/2018 02:13 AM    Wt Readings from Last 3 Encounters:  11/05/20 200 lb (90.7 kg)  10/08/20 202 lb 13.2 oz (92 kg)  09/05/20 190 lb (86.2  kg)      ASSESSMENT & PLAN:    Acute hypoxic respiratory failure Recurrent aspiration pneumonia Advised to go to ER Needs urgent imaging and IV antibiotics for possible aspiration pneumonia Discussed with ER Physican Needs further GI workup for esophageal dysmolility  Time:   Today, I have spent 13 minutes reviewing the chart, including problem list, medications, and with the patient with telehealth technology discussing the above problems.   Medication Adjustments/Labs and Tests Ordered: Current medicines are reviewed at length with the patient today.  Concerns regarding medicines are outlined above.   Tests Ordered: No orders of the defined types were placed in this encounter.   Medication Changes: No orders of the defined types were placed in this encounter.    Note: This dictation was prepared with Dragon dictation along with smaller phrase technology. Similar sounding words can be transcribed inadequately or may not be corrected upon review. Any transcriptional errors that result from this process are unintentional.  Disposition:  Follow up  Signed, Anabel Halon, MD  12/19/2020 10:29 AM     [facility]

## 2020-12-20 DIAGNOSIS — E039 Hypothyroidism, unspecified: Secondary | ICD-10-CM

## 2020-12-20 DIAGNOSIS — Z5329 Procedure and treatment not carried out because of patient's decision for other reasons: Secondary | ICD-10-CM | POA: Diagnosis present

## 2020-12-20 DIAGNOSIS — K220 Achalasia of cardia: Secondary | ICD-10-CM | POA: Diagnosis present

## 2020-12-20 DIAGNOSIS — Z20822 Contact with and (suspected) exposure to covid-19: Secondary | ICD-10-CM | POA: Diagnosis present

## 2020-12-20 DIAGNOSIS — F32A Depression, unspecified: Secondary | ICD-10-CM | POA: Diagnosis present

## 2020-12-20 DIAGNOSIS — A419 Sepsis, unspecified organism: Secondary | ICD-10-CM

## 2020-12-20 DIAGNOSIS — Z7982 Long term (current) use of aspirin: Secondary | ICD-10-CM | POA: Diagnosis not present

## 2020-12-20 DIAGNOSIS — J189 Pneumonia, unspecified organism: Secondary | ICD-10-CM | POA: Diagnosis present

## 2020-12-20 DIAGNOSIS — J9601 Acute respiratory failure with hypoxia: Secondary | ICD-10-CM | POA: Diagnosis present

## 2020-12-20 DIAGNOSIS — E872 Acidosis: Secondary | ICD-10-CM | POA: Diagnosis present

## 2020-12-20 DIAGNOSIS — Z800 Family history of malignant neoplasm of digestive organs: Secondary | ICD-10-CM | POA: Diagnosis not present

## 2020-12-20 DIAGNOSIS — J690 Pneumonitis due to inhalation of food and vomit: Secondary | ICD-10-CM | POA: Diagnosis present

## 2020-12-20 DIAGNOSIS — G4733 Obstructive sleep apnea (adult) (pediatric): Secondary | ICD-10-CM | POA: Diagnosis present

## 2020-12-20 DIAGNOSIS — K224 Dyskinesia of esophagus: Secondary | ICD-10-CM | POA: Diagnosis present

## 2020-12-20 DIAGNOSIS — K219 Gastro-esophageal reflux disease without esophagitis: Secondary | ICD-10-CM

## 2020-12-20 DIAGNOSIS — R6520 Severe sepsis without septic shock: Secondary | ICD-10-CM | POA: Diagnosis present

## 2020-12-20 DIAGNOSIS — F419 Anxiety disorder, unspecified: Secondary | ICD-10-CM | POA: Diagnosis present

## 2020-12-20 DIAGNOSIS — Z79899 Other long term (current) drug therapy: Secondary | ICD-10-CM | POA: Diagnosis not present

## 2020-12-20 DIAGNOSIS — Z8042 Family history of malignant neoplasm of prostate: Secondary | ICD-10-CM | POA: Diagnosis not present

## 2020-12-20 DIAGNOSIS — Z8052 Family history of malignant neoplasm of bladder: Secondary | ICD-10-CM | POA: Diagnosis not present

## 2020-12-20 DIAGNOSIS — Z803 Family history of malignant neoplasm of breast: Secondary | ICD-10-CM | POA: Diagnosis not present

## 2020-12-20 DIAGNOSIS — Z79890 Hormone replacement therapy (postmenopausal): Secondary | ICD-10-CM | POA: Diagnosis not present

## 2020-12-20 DIAGNOSIS — K222 Esophageal obstruction: Secondary | ICD-10-CM | POA: Diagnosis present

## 2020-12-20 DIAGNOSIS — R739 Hyperglycemia, unspecified: Secondary | ICD-10-CM | POA: Diagnosis present

## 2020-12-20 LAB — COMPREHENSIVE METABOLIC PANEL
ALT: 18 U/L (ref 0–44)
ALT: 12 U/L (ref 0–44)
AST: 20 U/L (ref 15–41)
AST: 13 U/L — ABNORMAL LOW (ref 15–41)
Albumin: 4.1 g/dL (ref 3.5–5.0)
Albumin: 3.2 g/dL — ABNORMAL LOW (ref 3.5–5.0)
Alkaline Phosphatase: 68 U/L (ref 38–126)
Alkaline Phosphatase: 53 U/L (ref 38–126)
Anion gap: 9 (ref 5–15)
Anion gap: 5 (ref 5–15)
BUN: 13 mg/dL (ref 6–20)
BUN: 10 mg/dL (ref 6–20)
CO2: 29 mmol/L (ref 22–32)
CO2: 28 mmol/L (ref 22–32)
Calcium: 8.7 mg/dL — ABNORMAL LOW (ref 8.9–10.3)
Calcium: 7.8 mg/dL — ABNORMAL LOW (ref 8.9–10.3)
Chloride: 100 mmol/L (ref 98–111)
Chloride: 106 mmol/L (ref 98–111)
Creatinine, Ser: 0.85 mg/dL (ref 0.61–1.24)
Creatinine, Ser: 0.69 mg/dL (ref 0.61–1.24)
GFR, Estimated: >60 mL/min (ref >60)
GFR, Estimated: >60 mL/min (ref >60)
Glucose, Bld: 122 mg/dL — ABNORMAL HIGH (ref 70–99)
Glucose, Bld: 113 mg/dL — ABNORMAL HIGH (ref 70–99)
Potassium: 3.9 mmol/L (ref 3.5–5.1)
Potassium: 3.9 mmol/L (ref 3.5–5.1)
Sodium: 138 mmol/L (ref 135–145)
Sodium: 139 mmol/L (ref 135–145)
Total Bilirubin: 0.5 mg/dL (ref 0.3–1.2)
Total Bilirubin: 0.6 mg/dL (ref 0.3–1.2)
Total Protein: 7.9 g/dL (ref 6.5–8.1)
Total Protein: 6.1 g/dL — ABNORMAL LOW (ref 6.5–8.1)

## 2020-12-20 LAB — CBC WITH DIFFERENTIAL/PLATELET
Abs Immature Granulocytes: 0.03 10*3/uL (ref 0.00–0.07)
Abs Immature Granulocytes: 0.03 10*3/uL (ref 0.00–0.07)
Basophils Absolute: 0 10*3/uL (ref 0.0–0.1)
Basophils Absolute: 0 10*3/uL (ref 0.0–0.1)
Basophils Relative: 0 %
Basophils Relative: 0 %
Eosinophils Absolute: 0 10*3/uL (ref 0.0–0.5)
Eosinophils Absolute: 0.1 10*3/uL (ref 0.0–0.5)
Eosinophils Relative: 0 %
Eosinophils Relative: 1 %
HCT: 36.6 % — ABNORMAL LOW (ref 39.0–52.0)
HCT: 43.2 % (ref 39.0–52.0)
Hemoglobin: 11.5 g/dL — ABNORMAL LOW (ref 13.0–17.0)
Hemoglobin: 14.3 g/dL (ref 13.0–17.0)
Immature Granulocytes: 0 %
Immature Granulocytes: 0 %
Lymphocytes Relative: 14 %
Lymphocytes Relative: 8 %
Lymphs Abs: 0.6 10*3/uL — ABNORMAL LOW (ref 0.7–4.0)
Lymphs Abs: 1 10*3/uL (ref 0.7–4.0)
MCH: 29.3 pg (ref 26.0–34.0)
MCH: 29.8 pg (ref 26.0–34.0)
MCHC: 31.4 g/dL (ref 30.0–36.0)
MCHC: 33.1 g/dL (ref 30.0–36.0)
MCV: 90 fL (ref 80.0–100.0)
MCV: 93.1 fL (ref 80.0–100.0)
Monocytes Absolute: 0.3 10*3/uL (ref 0.1–1.0)
Monocytes Absolute: 0.4 10*3/uL (ref 0.1–1.0)
Monocytes Relative: 5 %
Monocytes Relative: 6 %
Neutro Abs: 5.8 10*3/uL (ref 1.7–7.7)
Neutro Abs: 5.9 10*3/uL (ref 1.7–7.7)
Neutrophils Relative %: 80 %
Neutrophils Relative %: 86 %
Platelets: 165 10*3/uL (ref 150–400)
Platelets: 189 10*3/uL (ref 150–400)
RBC: 3.93 MIL/uL — ABNORMAL LOW (ref 4.22–5.81)
RBC: 4.8 MIL/uL (ref 4.22–5.81)
RDW: 13.1 % (ref 11.5–15.5)
RDW: 13.3 % (ref 11.5–15.5)
WBC: 6.9 10*3/uL (ref 4.0–10.5)
WBC: 7.3 10*3/uL (ref 4.0–10.5)
nRBC: 0 % (ref 0.0–0.2)
nRBC: 0 % (ref 0.0–0.2)

## 2020-12-20 LAB — MAGNESIUM: Magnesium: 2 mg/dL (ref 1.7–2.4)

## 2020-12-20 LAB — URINALYSIS, ROUTINE W REFLEX MICROSCOPIC
Bacteria, UA: NONE SEEN
Bilirubin Urine: NEGATIVE
Glucose, UA: NEGATIVE mg/dL
Ketones, ur: NEGATIVE mg/dL
Leukocytes,Ua: NEGATIVE
Nitrite: NEGATIVE
Protein, ur: NEGATIVE mg/dL
Specific Gravity, Urine: 1.004 — ABNORMAL LOW (ref 1.005–1.030)
pH: 7 (ref 5.0–8.0)

## 2020-12-20 LAB — RESP PANEL BY RT-PCR (FLU A&B, COVID) ARPGX2
Influenza A by PCR: NEGATIVE
Influenza B by PCR: NEGATIVE
SARS Coronavirus 2 by RT PCR: NEGATIVE

## 2020-12-20 LAB — PROCALCITONIN: Procalcitonin: 0.15 ng/mL

## 2020-12-20 LAB — APTT: aPTT: 21 seconds — ABNORMAL LOW (ref 24–36)

## 2020-12-20 LAB — STREP PNEUMONIAE URINARY ANTIGEN: Strep Pneumo Urinary Antigen: NEGATIVE

## 2020-12-20 LAB — MRSA PCR SCREENING: MRSA by PCR: NEGATIVE

## 2020-12-20 LAB — LACTIC ACID, PLASMA
Lactic Acid, Venous: 1.1 mmol/L (ref 0.5–1.9)
Lactic Acid, Venous: 1.3 mmol/L (ref 0.5–1.9)
Lactic Acid, Venous: 2.1 mmol/L (ref 0.5–1.9)
Lactic Acid, Venous: 3 mmol/L (ref 0.5–1.9)

## 2020-12-20 LAB — PROTIME-INR
INR: 0.9 (ref 0.8–1.2)
Prothrombin Time: 12.4 seconds (ref 11.4–15.2)

## 2020-12-20 LAB — PHOSPHORUS: Phosphorus: 1.7 mg/dL — ABNORMAL LOW (ref 2.5–4.6)

## 2020-12-20 MED ORDER — ACETAMINOPHEN 325 MG PO TABS
650.0000 mg | ORAL_TABLET | Freq: Once | ORAL | Status: AC
  Administered 2020-12-20: 650 mg via ORAL
  Filled 2020-12-20: qty 2

## 2020-12-20 MED ORDER — DILTIAZEM HCL ER COATED BEADS 120 MG PO CP24
120.0000 mg | ORAL_CAPSULE | Freq: Every day | ORAL | Status: DC
  Filled 2020-12-20: qty 1

## 2020-12-20 MED ORDER — MORPHINE SULFATE (PF) 2 MG/ML IV SOLN
0.5000 mg | Freq: Once | INTRAVENOUS | Status: AC | PRN
  Administered 2020-12-20: 0.5 mg via INTRAVENOUS
  Filled 2020-12-20: qty 1

## 2020-12-20 MED ORDER — PANTOPRAZOLE SODIUM 40 MG IV SOLR
40.0000 mg | INTRAVENOUS | Status: DC
  Administered 2020-12-20 – 2020-12-21 (×2): 40 mg via INTRAVENOUS
  Filled 2020-12-20 (×2): qty 40

## 2020-12-20 MED ORDER — SODIUM CHLORIDE 0.9 % IV BOLUS
1000.0000 mL | Freq: Once | INTRAVENOUS | Status: AC
  Administered 2020-12-20: 1000 mL via INTRAVENOUS

## 2020-12-20 MED ORDER — ENOXAPARIN SODIUM 40 MG/0.4ML IJ SOSY
40.0000 mg | PREFILLED_SYRINGE | INTRAMUSCULAR | Status: DC
  Administered 2020-12-20: 40 mg via SUBCUTANEOUS
  Filled 2020-12-20 (×2): qty 0.4

## 2020-12-20 MED ORDER — VANCOMYCIN HCL IN DEXTROSE 1-5 GM/200ML-% IV SOLN
1000.0000 mg | Freq: Three times a day (TID) | INTRAVENOUS | Status: DC
  Administered 2020-12-20: 1000 mg via INTRAVENOUS
  Filled 2020-12-20: qty 200

## 2020-12-20 MED ORDER — LEVOTHYROXINE SODIUM 25 MCG PO TABS
25.0000 ug | ORAL_TABLET | Freq: Every day | ORAL | Status: DC
  Administered 2020-12-21: 25 ug via ORAL
  Filled 2020-12-20 (×2): qty 1

## 2020-12-20 MED ORDER — DILTIAZEM HCL ER 120 MG PO CP24: 120.0000 mg | ORAL_CAPSULE | Freq: Every day | ORAL | Status: DC

## 2020-12-20 MED ORDER — DM-GUAIFENESIN ER 30-600 MG PO TB12
1.0000 | ORAL_TABLET | Freq: Two times a day (BID) | ORAL | Status: DC
  Administered 2020-12-20: 1 via ORAL
  Filled 2020-12-20 (×2): qty 1

## 2020-12-20 MED ORDER — SODIUM CHLORIDE 0.9 % IV SOLN
2.0000 g | Freq: Three times a day (TID) | INTRAVENOUS | Status: DC
  Administered 2020-12-20 – 2020-12-21 (×4): 2 g via INTRAVENOUS
  Filled 2020-12-20 (×5): qty 2

## 2020-12-20 NOTE — Progress Notes (Signed)
Pharmacy Antibiotic Note  Christian Ellison is a 60 y.o. male admitted on 12/19/2020 with pneumonia.  Pharmacy has been consulted for Vancomycin/Cefepime dosing. The pt has a history of esophageal dysmotility/aspiration pneumonia. WBC WNL. Renal function good.  Plan: Vancomycin 1000 mg IV q8h >>Estimated AUC: 505 Cefepime 2g IV q8h Trend WBC, temp, renal function  F/U infectious work-up Drug levels as indicated   Height: 6' (182.9 cm) Weight: 91.6 kg (202 lb) IBW/kg (Calculated) : 77.6  Temp (24hrs), Avg:101.6 F (38.7 C), Min:99.8 F (37.7 C), Max:103.3 F (39.6 C)  Recent Labs  Lab 12/19/20 2356  WBC 6.9  LATICACIDVEN 3.0*    CrCl cannot be calculated (Patient's most recent lab result is older than the maximum 21 days allowed.).    No Known Allergies  Abran Duke, PharmD, BCPS Clinical Pharmacist Phone: [phone]

## 2020-12-20 NOTE — Final Consult Note (Signed)
The patient is very well-known to me from previous hospitalizations.  Gastroenterology was consulted for evaluation of episodes of aspiration pneumonia in the setting of known dysmotility.  Our service was consulted to determine the next steps in management of his dysmotility.  As previously described in her last consult note from March 2022, the patient had evidence of an esophageal manometry and timed barium swallow of findings consistent with type III achalasia.  He is currently following at [facility], where he underwent Endoflip testing and a trial of Botox injection prior to consideration for POEM.  Unfortunately, I do not have access to this report but these procedures were performed in April 2022.  He is supposed to have follow-up appointment with [facility] gastroenterology to determine the next step in his management.  In the past, I have explained to the patient that given the severity of his achalasia, he is very likely to have recurrent episodes of pneumonia and ideally he should be kept on a liquid or pured diet but he adamantly refused to do so during her last encounter in the hospital.  At this point, the next step in the management of his dysmotility is for him to follow at [facility] gastroenterology to determine his eligibility for POEM versus Heller myotomy.  I will favor the former given the type of achalasia that he has.  Katrinka Blazing, MD Gastroenterology and Hepatology [facility]

## 2020-12-20 NOTE — Progress Notes (Signed)
SLP Cancellation Note  Patient Details Name: Christian Ellison MRN: [id number] DOB: 19-Nov-1960   Cancelled treatment:        Recent MBSS completed 3/22 detailed below (further note that MBSS was routed to GI doctors at [facility]). As Pt has known primary esophageal dysphagia negatively impacting the pharyngeal phase and is at high risk for post prandial aspiration, PO trials are not indicated at bedside given current presentation. Will defer to GI for further recommendations, evaluation and treatment. SLP spoke with attending, Dr. Lowell Guitar who is in agreement that ST will hold for BSE at this time and will defer to GI for recommendations.  MBSS completed 11/05/2020: <<Pt presents with primary esophageal dysphagia which negatively impacts pharyngeal phase. Pt with premature spillage of liquids to the pyriforms with swallow initiation at the level of the pyriforms, no penetration or aspiration. Pt with trace pyriform residuals post swallow with liquids and puree. Pt with adequate hyolaryngeal excursion and tongue base retraction. He required two swallows to move the barium tablet taken with cup sip thin out of the oral cavity. Esophageal sweep revealed standing column of barium with bird peak appearance near GEJ even after thin liquids and puree only. Retention of food/barium increased throughout the study with eventual backflow of barium into the cervical esophagus. Pt has a follow up appointment in April per his report with the GI team at [facility] for consideration of possible Botox. Pt may be best to adhere to liquids only until he is seen. Pt is at risk for post prandial aspiration due to esophageal retention across textures and consistencies. Pt was encouraged to sit fully upright for all eating and drinking and remain upright for greater than 30 minutes post meals. He should also elevate the head of his bed (he only uses extra pillows at this time). SLP showed the imaging from the MBSS to Pt and reviewed esophageal  swallow precautions. SLP will follow up x1 during acute stay to reinforce recommendations from today. MD, please see imaging attached in Image section.>>  Thank you, Rayann Heman. Romie Levee, CCC-SLP Speech Language Pathologist  Georgetta Haber 12/20/2020, 12:09 PM

## 2020-12-20 NOTE — Progress Notes (Signed)
PROGRESS NOTE    Christian Ellison  ZNB:[id number] DOB: Dec 21, 1960 DOA: 12/19/2020 PCP: Christian Spar, MD   Chief Complaint  Patient presents with  . Shortness of Breath    Brief Narrative: Christian Ellison is Christian Ellison 60 y.o. male with medical history significant for esophageal dysmotility/esophageal stricture, recurrent aspiration pneumonia, hypothyroidism, anxiety,  prior polysubstance abuse, OSA on CPAPwho presents to the emergency department due to shortness of breath.  Patient was unable to provide history possibly due to being asleep (easily arousable, but quickly goes back to sleep), history was obtained from ED physician and wife at bedside. Per wife, patient has been having regurgitation of retained food/fluid in the esophagus for several days, he was noted to present with weakness yesterday in the morning and was without energy, wife checked his pulse ox and was noted to be 84% on room air and was noted to be tachycardic at rate of 104 bpm, since patient has had recurrent aspiration pneumonia, it was decided to go to the ED for further evaluation.  ED Course:  In the emergency department, he was noted to be febrile at Christian Ellison temperature of 100.49F, RR 30, HR 113 beats per minute, O2 sat was 90-98% on 6 LPM of oxygen via [place] (patient does not use supplemental oxygen at baseline).  Work-up in the ED showed normal CBC and BMP except for mild hyperglycemia.  Lactic acid 3.0 > 2.1.  Influenza Christian Ellison, B and SARS coronavirus 2 was negative. Chest x-ray showed diffuse left greater than right patchy pulmonary opacity suspect for respiratory infection, potential atypical pneumonia. Patient was started on IV vancomycin and cefepime, IV hydration with LR was provided.  Tylenol was given.  Hospitalist was asked to admit patient for further evaluation and management.  Assessment & Plan:   Principal Problem:   Aspiration pneumonia (Christian Ellison) Active Problems:   Esophageal stricture/stenosis/esophageal dysmotility  with recurrent aspiration   Hypothyroidism   Obstructive sleep apnea   GERD (gastroesophageal reflux disease)   Acute respiratory failure with hypoxia (HCC)   Sepsis (HCC)  Acute respiratory failure with hypoxia due sepsis secondary to to aspiration pneumonia Patient was febrile, tachycardic, tachypneic (met SIRS criteria), patient was hypoxic (respiratory endorgan failure).  Lactic acid was 3.0 > 2.1. CXR with diffuse L>R patchy pulm opacity  D/c vanc.  Continue cefepime. Negative urine strep, pending legionella. Pending sputum cx. Urine cx pending No Blood cx pending Based on hx, likely 2/2 aspiration pneumonia in setting of achalasia/esophageal dysmotility - per SLP, PO trials not necessarily indicated due to current presentation.  Discussed with Christian Ellison from GI who felt patients manometry c/w type III achalasia and recommended outpatient follow up with Christian Ellison for evaluation for POEM (per oral endoscopy myotomy).  They noted ok for full liquid diet, softer pudding texture meals/ice cream to avoid recurrent aspirations.  PPI.    Lactic acidosis Lactic acid 3.0 . 2.1; continue to trend lactic acid  Dysphagia  Achalasia GERD Continue Protonix, diltiazem Gi recommending outpatient follow up with Christian Ellison as noted above  Obstructive sleep apnea on CPAP Continue CPAP  Hypothyroidism Continue Synthroid  DVT prophylaxis: lovenox Code Status: full  Family Communication: none at bedside - called wife, both numbers, no answer Disposition:   Status is: Inpatient  Remains inpatient appropriate because:Inpatient level of care appropriate due to severity of illness   Dispo: The patient is from: Home              Anticipated d/c is to: Home  Patient currently is not medically stable to d/c.   Difficult to place patient No       Consultants:   GI  Procedures:   none  Antimicrobials:  Anti-infectives (From admission, onward)   Start     Dose/Rate Route  Frequency Ordered Stop   12/20/20 1000  vancomycin (VANCOCIN) IVPB 1000 mg/200 mL premix  Status:  Discontinued        1,000 mg 200 mL/hr over 60 Minutes Intravenous Every 8 hours 12/20/20 0150 12/20/20 1125   12/20/20 0700  ceFEPIme (MAXIPIME) 2 g in sodium chloride 0.9 % 100 mL IVPB        2 g 200 mL/hr over 30 Minutes Intravenous Every 8 hours 12/20/20 0150     12/19/20 2330  vancomycin (VANCOCIN) IVPB 1000 mg/200 mL premix        1,000 mg 200 mL/hr over 60 Minutes Intravenous  Once 12/19/20 2318 12/20/20 0158   12/19/20 2330  ceFEPIme (MAXIPIME) 2 g in sodium chloride 0.9 % 100 mL IVPB        2 g 200 mL/hr over 30 Minutes Intravenous  Once 12/19/20 2318 12/20/20 0055         Subjective: Asking to eat, no new complaints  Objective: Vitals:   12/20/20 0500 12/20/20 0751 12/20/20 0811 12/20/20 1439  BP:  (!) 94/51  133/74  Pulse:  70  96  Resp:   20   Temp:  98.7 F (37.1 C)  100 F (37.8 C)  TempSrc:  Oral  Oral  SpO2:  (!) 87% 91% 92%  Weight: 95 kg     Height: 6' (1.829 m)       Intake/Output Summary (Last 24 hours) at 12/20/2020 1833 Last data filed at 12/20/2020 0500 Gross per 24 hour  Intake 2894.26 ml  Output 450 ml  Net 2444.26 ml   Filed Weights   12/19/20 2301 12/20/20 0500  Weight: 91.6 kg 95 kg    Examination:  Limited as he runs to bathroom in middle of evaluation General exam: Appears calm and comfortable  Respiratory system: unlabored, walks to bathroom off oxygen, unlabored Gastrointestinal system: Abdomen is nondistended, soft and nontender Central nervous system: Alert and oriented. No focal neurological deficits. Extremities: no LEE Skin: No rashes, lesions or ulcers Psychiatry: Judgement and insight appear normal. Mood & affect appropriate.     Data Reviewed: I have personally reviewed following labs and imaging studies  CBC: Recent Labs  Lab 12/19/20 2356 12/20/20 0521  WBC 6.9 7.3  NEUTROABS 5.9 5.8  HGB 14.3 11.5*  HCT 43.2  36.6*  MCV 90.0 93.1  PLT 189 951    Basic Metabolic Panel: Recent Labs  Lab 12/19/20 2356 12/20/20 0521  NA 138 139  K 3.9 3.9  CL 100 106  CO2 29 28  GLUCOSE 122* 113*  BUN 13 10  CREATININE 0.85 0.69  CALCIUM 8.7* 7.8*  MG 2.0  --   PHOS 1.7*  --     GFR: Estimated Creatinine Clearance: 119 mL/min (by C-G formula based on SCr of 0.69 mg/dL).  Liver Function Tests: Recent Labs  Lab 12/19/20 2356 12/20/20 0521  AST 20 13*  ALT 18 12  ALKPHOS 68 53  BILITOT 0.5 0.6  PROT 7.9 6.1*  ALBUMIN 4.1 3.2*    CBG: No results for input(s): GLUCAP in the last 168 hours.   Recent Results (from the past 240 hour(s))  Blood Culture (routine x 2)     Status: None (Preliminary  result)   Collection Time: 12/19/20 11:56 PM   Specimen: BLOOD  Result Value Ref Range Status   Specimen Description BLOOD LEFT ANTECUBITAL  Final   Special Requests AEROBIC BOTTLE ONLY Blood Culture adequate volume  Final   Culture   Final    NO GROWTH < 12 HOURS Performed at [facility], [address], [place], [place] [address]    Report Status PENDING  Incomplete  Blood Culture (routine x 2)     Status: None (Preliminary result)   Collection Time: 12/19/20 11:56 PM   Specimen: BLOOD  Result Value Ref Range Status   Specimen Description BLOOD RIGHT ANTECUBITAL  Final   Special Requests AEROBIC BOTTLE ONLY Blood Culture adequate volume  Final   Culture   Final    NO GROWTH < 12 HOURS Performed at [facility], [address], [place], [place] [address]    Report Status PENDING  Incomplete  Resp Panel by RT-PCR (Flu Paiden Caraveo&B, Covid) Nasopharyngeal Swab     Status: None   Collection Time: 12/20/20 12:28 AM   Specimen: Nasopharyngeal Swab; Nasopharyngeal(NP) swabs in vial transport medium  Result Value Ref Range Status   SARS Coronavirus 2 by RT PCR NEGATIVE NEGATIVE Final    Comment: (NOTE) SARS-CoV-2 target nucleic acids are NOT DETECTED.  The SARS-CoV-2 RNA is generally detectable in upper  respiratory specimens during the acute phase of infection. The lowest concentration of SARS-CoV-2 viral copies this assay can detect is 138 copies/mL. Gunther Zawadzki negative result does not preclude SARS-Cov-2 infection and should not be used as the sole basis for treatment or other patient management decisions. Yoshiaki Kreuser negative result may occur with  improper specimen collection/handling, submission of specimen other than nasopharyngeal swab, presence of viral mutation(s) within the areas targeted by this assay, and inadequate number of viral copies(<138 copies/mL). Delorus Langwell negative result must be combined with clinical observations, patient history, and epidemiological information. The expected result is Negative.  Fact Sheet for Patients:  EntrepreneurPulse.com.au  Fact Sheet for Healthcare Providers:  IncredibleEmployment.be  This test is no t yet approved or cleared by the [place] FDA and  has been authorized for detection and/or diagnosis of SARS-CoV-2 by FDA under an Emergency Use Authorization (EUA). This EUA will remain  in effect (meaning this test can be used) for the duration of the COVID-19 declaration under Section 564(b)(1) of the Act, 21 U.S.C.section 360bbb-3(b)(1), unless the authorization is terminated  or revoked sooner.       Influenza Aleece Loyd by PCR NEGATIVE NEGATIVE Final   Influenza B by PCR NEGATIVE NEGATIVE Final    Comment: (NOTE) The Xpert Xpress SARS-CoV-2/FLU/RSV plus assay is intended as an aid in the diagnosis of influenza from Nasopharyngeal swab specimens and should not be used as Romona Murdy sole basis for treatment. Nasal washings and aspirates are unacceptable for Xpert Xpress SARS-CoV-2/FLU/RSV testing.  Fact Sheet for Patients: EntrepreneurPulse.com.au  Fact Sheet for Healthcare Providers: IncredibleEmployment.be  This test is not yet approved or cleared by the [place] FDA and has been  authorized for detection and/or diagnosis of SARS-CoV-2 by FDA under an Emergency Use Authorization (EUA). This EUA will remain in effect (meaning this test can be used) for the duration of the COVID-19 declaration under Section 564(b)(1) of the Act, 21 U.S.C. section 360bbb-3(b)(1), unless the authorization is terminated or revoked.  Performed at [facility], [address] [place], [place] [address]   MRSA PCR Screening     Status: None   Collection Time: 12/20/20  8:20  AM   Specimen: Nasopharyngeal  Result Value Ref Range Status   MRSA by PCR NEGATIVE NEGATIVE Final    Comment:        The GeneXpert MRSA Assay (FDA approved for NASAL specimens only), is one component of Rob Mciver comprehensive MRSA colonization surveillance program. It is not intended to diagnose MRSA infection nor to guide or monitor treatment for MRSA infections. Performed at [facility], [address] [place], [place] [address]          Radiology Studies: [facility] 1 View  Result Date: 12/20/2020 CLINICAL DATA:  Shortness of breath EXAM: PORTABLE CHEST 1 VIEW COMPARISON:  11/05/2020, CT 11/05/2020 FINDINGS: Interval development of patchy airspace disease throughout the left greater than right thorax. No pleural effusion. Normal cardiomediastinal silhouette. No pneumothorax. IMPRESSION: Diffuse left greater than right patchy pulmonary opacity suspect for respiratory infection, potential atypical pneumonia. Electronically Signed   By: Donavan Foil M.D.   On: 12/20/2020 00:00        Scheduled Meds: . dextromethorphan-guaiFENesin  1 tablet Oral BID  . diltiazem  120 mg Oral Daily  . enoxaparin (LOVENOX) injection  40 mg Subcutaneous Q24H  . levothyroxine  25 mcg Oral Q0600  . pantoprazole (PROTONIX) IV  40 mg Intravenous Q24H   Continuous Infusions: . ceFEPime (MAXIPIME) IV 2 g (12/20/20 1330)  . lactated ringers 150 mL/hr at 12/20/20 1325     LOS: 0 days    Time spent: over 30 min      Fayrene Helper, MD [facility]   To contact the attending provider between 7A-7P or the covering provider during after hours 7P-7A, please log into the web site [email or web address] and access using universal [place] password for that web site. If you do not have the password, please call the hospital operator.  12/20/2020, 6:33 PM

## 2020-12-20 NOTE — Progress Notes (Signed)
AC to patient room per request. Patient wife insisting that she will stay overnight with patient. AC discussed visitation policy and appropriate reasons for exceptions to policy. AC explained that patient wife will need to leave campus since it is after visiting hours and she may return in the morning at 7am when visitation hours resume. Patient and wife upset, raised voices to [facility] in rebuttal of following policy and wife making no attempt to leave room. AC told wife she could have 5 mins to gather belongings and leave campus by 9:10pm. Security and [employer] in hallway to badge patient out and ensure she gets to her vehicle safely. Patient states he will leave the hospital by morning, AC discussed risks of leaving against medical advice and benefits of staying until discharged by Provider. Patient states he doesn't care and will leave when he gets ready to.

## 2020-12-20 NOTE — ED Notes (Signed)
Date and time results received: 12/20/20 12:26 AM  Test: Lactic Acid  Critical Value: 3.0  Name of Provider Notified: Dr. Manus Gunning  Orders Received? Or Actions Taken?:

## 2020-12-20 NOTE — H&P (Signed)
History and Physical  Christian Ellison TDV:[id number] DOB: Dec 31, 1960 DOA: 12/19/2020  Referring physician: Ezequiel Essex, MD PCP: Lindell Spar, MD  Patient coming from: Home  Chief Complaint: Shortness of breath  HPI: Christian Ellison is a 60 y.o. male with medical history significant for esophageal dysmotility/esophageal stricture, recurrent aspiration pneumonia, hypothyroidism, anxiety,  prior polysubstance abuse, OSA on CPAPwho presents to the emergency department due to shortness of breath.  Patient was unable to provide history possibly due to being asleep (easily arousable, but quickly goes back to sleep), history was obtained from ED physician and wife at bedside. Per wife, patient has been having regurgitation of retained food/fluid in the esophagus for several days, he was noted to present with weakness yesterday in the morning and was without energy, wife checked his pulse ox and was noted to be 84% on room air and was noted to be tachycardic at rate of 104 bpm, since patient has had recurrent aspiration pneumonia, it was decided to go to the ED for further evaluation.  ED Course:  In the emergency department, he was noted to be febrile at a temperature of 100.20F, RR 30, HR 113 beats per minute, O2 sat was 90-98% on 6 LPM of oxygen via [place] (patient does not use supplemental oxygen at baseline).  Work-up in the ED showed normal CBC and BMP except for mild hyperglycemia.  Lactic acid 3.0 > 2.1.  Influenza A, B and SARS coronavirus 2 was negative. Chest x-ray showed diffuse left greater than right patchy pulmonary opacity suspect for respiratory infection, potential atypical pneumonia. Patient was started on IV vancomycin and cefepime, IV hydration with LR was provided.  Tylenol was given.  Hospitalist was asked to admit patient for further evaluation and management.  Review of Systems: Constitutional: Positive for chills and fever.  HENT: Negative for ear pain and sore throat.    Eyes: Negative for pain and visual disturbance.  Respiratory: Positive for cough and shortness of breath.   Cardiovascular: Negative for chest pain and palpitations.  Gastrointestinal: Negative for abdominal pain and vomiting.  Endocrine: Negative for polyphagia and polyuria.  Genitourinary: Negative for decreased urine volume, dysuria, enuresis Musculoskeletal: Negative for arthralgias and back pain.  Skin: Negative for color change and rash.  Allergic/Immunologic: Negative for immunocompromised state.  Neurological: Positive for weakness.  Negative for tremors, syncope, speech difficulty, Hematological: Does not bruise/bleed easily.  All other systems reviewed and are negative   Past Medical History:  Diagnosis Date  . Anxiety   . Aspiration pneumonia ([place]) 05/26/2018   RLL  . Chronic pain   . Closed fracture of xiphoid process   . Esophageal stricture   . GERD (gastroesophageal reflux disease)   . 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   Marland Kitchen Opiate abuse, continuous ([place])   . Pulmonary edema 02/02/2018  . Sepsis due to pneumonia ([place]) 03/11/2019  . Sleep apnea    CPAP nightly   Past Surgical History:  Procedure Laterality Date  . BIOPSY  03/18/2018   Procedure: BIOPSY;  Surgeon: Rogene Houston, MD;  [facility];  Service: Endoscopy;;  gastric   . COLONOSCOPY N/A 11/22/2013   Dr. Laural Golden: Normal except for hemorrhoids.  Next colonoscopy 10 years.  . ESOPHAGEAL DILATION N/A 03/18/2018   Procedure: ESOPHAGEAL DILATION;  Surgeon: Rogene Houston, MD;  [facility];  Service: Endoscopy;  Laterality: N/A;  . ESOPHAGEAL DILATION N/A 08/07/2020   Procedure: ESOPHAGEAL DILATION;  Surgeon: Laural Golden,  Mechele Dawley, MD;  [facility];  Service: Endoscopy;  Laterality: N/A;  . ESOPHAGEAL MANOMETRY N/A 06/15/2018   Procedure: ESOPHAGEAL MANOMETRY (EM);  Surgeon: Mauri Pole, MD;  Location: [facility];  Service: Endoscopy;  Laterality: N/A;  .  ESOPHAGOGASTRODUODENOSCOPY (EGD) WITH PROPOFOL N/A 03/18/2018   Dr. Laural Golden: Benign-appearing mild esophageal stenosis proximal to the GE J, status post dilation.  2 cm hiatal hernia.  Gastritis, biopsies negative for H. pylori  . ESOPHAGOGASTRODUODENOSCOPY (EGD) WITH PROPOFOL N/A 08/07/2020   Procedure: ESOPHAGOGASTRODUODENOSCOPY (EGD) WITH PROPOFOL;  Surgeon: Rogene Houston, MD;  [facility];  Service: Endoscopy;  Laterality: N/A;  . Fracture Right Leg     Patient has rod/screws in this leg  . ORIF CALCANEOUS FRACTURE Left 07/04/2020   Procedure: OPEN REDUCTION INTERNAL FIXATION (ORIF) LEFT TALUS AND CALCANEOUS FRACTURE, SUBTALAR JOINT ARTHROTOMY WITH REMOVAL OF LOOSE BODIES, TALONAVICULAR JOINT ARTHOTOMY WITH REMOVAL OF LOOSE BODIES;  Surgeon: Erle Crocker, MD;  Location: [facility];  Service: Orthopedics;  Laterality: Left;  . Rotor Cuff  2012   Right Shoulder    Social History:  reports that he has never smoked. He has never used smokeless tobacco. He reports previous alcohol use. He reports previous drug use. Drug: Benzodiazepines.   No Known Allergies  Family History  Problem Relation Age of Onset  . Breast cancer Mother   . Kidney failure Mother   . Stroke Mother   . Colon cancer Father   . Bladder Cancer Father   . Prostate cancer Father   . Hypertension Sister   . Hypothyroidism Sister   . Healthy Sister   . Healthy Daughter   . Healthy Son   . Healthy Son      Prior to Admission medications   Medication Sig Start Date End Date Taking? Authorizing Provider  acetaminophen (TYLENOL) 325 MG tablet Take 2 tablets (650 mg total) by mouth every 6 (six) hours as needed for mild pain (or Fever >/= 101). 05/17/19   Roxan Hockey, MD  albuterol (VENTOLIN HFA) 108 (90 Base) MCG/ACT inhaler Inhale 2 puffs into the lungs every 6 (six) hours as needed for wheezing or shortness of breath. 09/09/20   Lindell Spar, MD  ALPRAZolam Duanne Moron) 1 MG tablet Take 1 mg by mouth  every 6 (six) hours as needed for anxiety.  10/22/19   [provider]  amoxicillin-clavulanate (AUGMENTIN) 875-125 MG tablet Take 1 tablet by mouth every 12 (twelve) hours. 11/08/20   Orson Eva, MD  aspirin EC 81 MG tablet Take 1 tablet (81 mg total) by mouth daily with breakfast. Patient taking differently: Take 81 mg by mouth at bedtime. 03/13/19   Roxan Hockey, MD  Cimetidine (TAGAMET PO) Take 75 mg by mouth daily as needed (if he feels his food coming up after meal).    [provider]  diltiazem (DILACOR XR) 120 MG 24 hr capsule Take 1 capsule (120 mg total) by mouth daily. 08/17/20   Danford, Suann Larry, MD  Glucosamine HCl 1000 MG TABS Take 1,000 mg by mouth at bedtime.    [provider]  levothyroxine (SYNTHROID) 25 MCG tablet TAKE 1 TABLET BY MOUTH EVERY DAY 12/17/20   Lindell Spar, MD  Multiple Vitamin (MULTIVITAMIN WITH MINERALS) TABS tablet Take 1 tablet by mouth daily. 50+    [provider]  pantoprazole (PROTONIX) 40 MG tablet Take 1 tablet (40 mg total) by mouth 2 (two) times daily. 08/07/20 08/07/21  Roxan Hockey, MD  Physical Exam: BP 127/73   Pulse 96   Temp 98.3 F (36.8 C) (Oral)   Resp (!) 24   Ht 6' (1.829 m)   Wt 91.6 kg   SpO2 95%   BMI 27.40 kg/m   . General: 60 y.o. year-old male  Ill-appearing but in no acute distress.  Marland Kitchen HEENT: NCAT, EOMI . Neck: Supple, trachea medial . Cardiovascular: Tachycardia, regular rate and rhythm with no rubs or gallops.  No thyromegaly or JVD noted. 2/4 pulses in all 4 extremities. Marland Kitchen Respiratory: Tachypnea, decreased breath sounds on auscultation.  No wheezes . Abdomen: Soft nontender nondistended with normal bowel sounds x4 quadrants. . Muskuloskeletal: No cyanosis, clubbing or edema noted bilaterally . Neuro: No focal neurologic deficit.  Sensation intact . Skin: No ulcerative lesions noted or rashes . Psychiatry: Mood is appropriate for condition and setting          Labs on  Admission:  Basic Metabolic Panel: Recent Labs  Lab 12/19/20 2356  NA 138  K 3.9  CL 100  CO2 29  GLUCOSE 122*  BUN 13  CREATININE 0.85  CALCIUM 8.7*   Liver Function Tests: Recent Labs  Lab 12/19/20 2356  AST 20  ALT 18  ALKPHOS 68  BILITOT 0.5  PROT 7.9  ALBUMIN 4.1   No results for input(s): LIPASE, AMYLASE in the last 168 hours. No results for input(s): AMMONIA in the last 168 hours. CBC: Recent Labs  Lab 12/19/20 2356  WBC 6.9  NEUTROABS 5.9  HGB 14.3  HCT 43.2  MCV 90.0  PLT 189   Cardiac Enzymes: No results for input(s): CKTOTAL, CKMB, CKMBINDEX, TROPONINI in the last 168 hours.  BNP (last 3 results) Recent Labs    04/21/20 1322 11/05/20 0311  BNP 23.0 35.0    ProBNP (last 3 results) No results for input(s): PROBNP in the last 8760 hours.  CBG: No results for input(s): GLUCAP in the last 168 hours.  Radiological Exams on Admission: DG Chest Port 1 View  Result Date: 12/20/2020 CLINICAL DATA:  Shortness of breath EXAM: PORTABLE CHEST 1 VIEW COMPARISON:  11/05/2020, CT 11/05/2020 FINDINGS: Interval development of patchy airspace disease throughout the left greater than right thorax. No pleural effusion. Normal cardiomediastinal silhouette. No pneumothorax. IMPRESSION: Diffuse left greater than right patchy pulmonary opacity suspect for respiratory infection, potential atypical pneumonia. Electronically Signed   By: Donavan Foil M.D.   On: 12/20/2020 00:00    EKG: I independently viewed the EKG done and my findings are as followed: Sinus tachycardia at rate of 108 bpm with QTc (483 ms)   Assessment/Plan Present on Admission: . Aspiration pneumonia ([place]) . Hypothyroidism . Obstructive sleep apnea . GERD (gastroesophageal reflux disease) . Acute respiratory failure with hypoxia (H[facility])  Principal Problem:   Aspiration pneumonia (HCC) Active Problems:   Esophageal stricture/stenosis/esophageal dysmotility with recurrent aspiration    Hypothyroidism   Obstructive sleep apnea   GERD (gastroesophageal reflux disease)   Acute respiratory failure with hypoxia (HCC)   Sepsis ([place])  Acute respiratory failure with hypoxia due sepsis secondary to to aspiration pneumonia Patient was febrile, tachycardic, tachypneic (met SIRS criteria), patient was hypoxic (respiratory endorgan failure).  Lactic acid was 3.0 > 2.1. Patient was started on IV vancomycin and cefepime, we will continue same at this time with plan to de-escalate/discontinue based on procalcitonin, blood culture, sputum culture, strep pneumo and urine Legionella Continue Mucinex, incentive spirometry, flutter valve  Continue supplemental oxygen to maintain O2 sat > 92% with plan  to wean patient off supplemental oxygen as tolerated.  Lactic acidosis Lactic acid 3.0 . 2.1; continue to trend lactic acid  Esophageal stricture/stenosis/esophageal dysmotility GERD Continue Protonix, diltiazem Swallow eval will be done in the morning Patient follows with [facility] and was seen[facility] 10[facility]18/2020 by Dr. Owens Shark and Dorris Singh and he was set up to proceed with a repeat EGD with intraprocedural FLIP and Botox injection  Obstructive sleep apnea on CPAP Continue CPAP  Hypothyroidism Continue Synthroid  DVT prophylaxis: Lovenox  Code Status: Full code  Family Communication: None at bedside  Disposition Plan:  Patient is from:                        home Anticipated DC to:                   SNF or family members home Anticipated DC date:               2-3 days Anticipated DC barriers:           Patient requires inpatient management due to acute respiratory failure with hypoxia secondary to sepsis due to aspiration pneumonia and requiring IV antibiotics   Consults called: None  Admission status: Inpatient    Bernadette Hoit MD [facility]  12/20/2020, 4:22 AM

## 2020-12-21 DIAGNOSIS — R6520 Severe sepsis without septic shock: Secondary | ICD-10-CM

## 2020-12-21 DIAGNOSIS — A419 Sepsis, unspecified organism: Principal | ICD-10-CM

## 2020-12-21 DIAGNOSIS — K222 Esophageal obstruction: Secondary | ICD-10-CM

## 2020-12-21 DIAGNOSIS — J9601 Acute respiratory failure with hypoxia: Secondary | ICD-10-CM

## 2020-12-21 LAB — COMPREHENSIVE METABOLIC PANEL
ALT: 13 U/L (ref 0–44)
AST: 13 U/L — ABNORMAL LOW (ref 15–41)
Albumin: 3.3 g/dL — ABNORMAL LOW (ref 3.5–5.0)
Alkaline Phosphatase: 56 U/L (ref 38–126)
Anion gap: 8 (ref 5–15)
BUN: 10 mg/dL (ref 6–20)
CO2: 31 mmol/L (ref 22–32)
Calcium: 8.6 mg/dL — ABNORMAL LOW (ref 8.9–10.3)
Chloride: 98 mmol/L (ref 98–111)
Creatinine, Ser: 0.75 mg/dL (ref 0.61–1.24)
GFR, Estimated: >60 mL/min (ref >60)
Glucose, Bld: 108 mg/dL — ABNORMAL HIGH (ref 70–99)
Potassium: 3.4 mmol/L — ABNORMAL LOW (ref 3.5–5.1)
Sodium: 137 mmol/L (ref 135–145)
Total Bilirubin: 1.2 mg/dL (ref 0.3–1.2)
Total Protein: 6.8 g/dL (ref 6.5–8.1)

## 2020-12-21 LAB — URINE CULTURE: Culture: NO GROWTH

## 2020-12-21 LAB — CBC
HCT: 36.9 % — ABNORMAL LOW (ref 39.0–52.0)
Hemoglobin: 11.4 g/dL — ABNORMAL LOW (ref 13.0–17.0)
MCH: 28.4 pg (ref 26.0–34.0)
MCHC: 30.9 g/dL (ref 30.0–36.0)
MCV: 92 fL (ref 80.0–100.0)
Platelets: 159 10*3/uL (ref 150–400)
RBC: 4.01 MIL/uL — ABNORMAL LOW (ref 4.22–5.81)
RDW: 12.9 % (ref 11.5–15.5)
WBC: 7.4 10*3/uL (ref 4.0–10.5)
nRBC: 0 % (ref 0.0–0.2)

## 2020-12-21 LAB — APTT: aPTT: 30 seconds (ref 24–36)

## 2020-12-21 LAB — PROTIME-INR
INR: 1.1 (ref 0.8–1.2)
Prothrombin Time: 14.1 seconds (ref 11.4–15.2)

## 2020-12-21 MED ORDER — POTASSIUM CHLORIDE CRYS ER 20 MEQ PO TBCR: 20.0000 meq | EXTENDED_RELEASE_TABLET | Freq: Once | ORAL | Status: DC

## 2020-12-21 MED ORDER — AMOXICILLIN-POT CLAVULANATE 875-125 MG PO TABS: 1.0000 | ORAL_TABLET | Freq: Two times a day (BID) | ORAL | Status: DC

## 2020-12-21 MED ORDER — AMOXICILLIN-POT CLAVULANATE 875-125 MG PO TABS
1.0000 | ORAL_TABLET | Freq: Two times a day (BID) | ORAL | 0 refills | Status: DC
Start: 2020-12-21 — End: 2020-12-26

## 2020-12-21 NOTE — Discharge Summary (Signed)
Physician Discharge Summary  Christian Ellison IOX:[id number] DOB: 09-08-1960 DOA: 12/19/2020  PCP: Christian Halon, MD  Admit date: 12/19/2020 Discharge date: 12/21/2020  Admitted From: Home Disposition:  AGAINST MEDICAL ADVICE  Recommendations for Outpatient Follow-up:  1. Follow up with PCP in 1-2 weeks 2. Please obtain BMP/CBC in one week   Discharge Condition: AGAINST MEDICAL ADVICE    Brief/Interim Summary: 60 y/o male with a history of esophageal dysmotility/esophageal stricture, recurrent aspiration pneumonia, hypothyroidism, anxiety, prior polysubstance abuse, OSA on CPAPand recovered Hep Cpresenting with 1-2 day hx of sob and regurgitation of retained food/fluid in the esophagus for 2 days, he was noted to have weakness yesterday in the morning and was without energy, wife checked his pulse ox and was noted to be 84% on room air and was noted to be tachycardic at rate of 104 bpm, since patient has had recurrent aspiration pneumonia, it was decided to go to the ED for further evaluation.  ED Course:  In the emergency department, he was noted to be febrile at a temperature of 100.39F, RR 30, HR 113 beats per minute, O2 sat was 90-98% on 6 LPM of oxygen via Christian Ellison (patient does not use supplemental oxygen at baseline).  Work-up in the ED showed normal CBC and BMP except for mild hyperglycemia.  Lactic acid 3.0 > 2.1.  Influenza A, B and SARS coronavirus 2 was negative. Chest x-ray showed diffuse left greater than right patchy pulmonary opacity suspect for respiratory infection, potential atypical pneumonia. Patient was started on IV vancomycin and cefepime, IV hydration with LR was provided.  Tylenol was given.  Hospitalist was asked to admit patient for further evaluation and management.  Vancomycin discontinued once MRSA PCR was negative.   On 12/21/20, patient wanted to leave AMA.   In speaking with Christian Ellison, Christian Ellison has demonstrated the ability to understand his  medical condition(s) which include sepsis and aspiration pneumonia.  Christian Ellison has demonstrated the ability to appreciate how treatment for sepsis and aspiration pneumonia will be beneficial.   Christian Ellison has also demonstrated the ability to understand and appreciate how refusal of treatment for  Sepsis and aspiration pneumonia could result in harm, repeat hospitalization, and possibly death.  Christian Ellison demonstrates the ability to reason through the risks and benefits of the proposed treatment.  Finally, Christian Ellison is able to clearly communicate his/her choice.   Discharge Diagnoses:   Severe sepsis -Secondary to pneumonia -Present on admission -Presented with tachycardia, fever, and elevated lactic acid -Continue IV fluids -Lactic acid was 3.0 > 2.1. -continued on cefepime -Follow blood cultures--neg to date -UA negative for pyuria  Aspiration pneumonia -CTA chest negative for PE;other findings as discussed above -Continue cefepime during hospitalization -Discussed with Dr. Levon Ellison from GI who felt patients manometry c/w type III achalasia and recommended outpatient follow up with Christian Ellison for evaluation for POEM (per oral endoscopy myotomy).  They noted ok for full liquid diet, softer pudding texture meals/ice cream to avoid recurrent aspirations.  PPI.   -Rx amox/clav x 5 days  Acute respiratory failure with hypoxia -Secondary to pneumonia -stable on6L HF -wean oxygen for saturation 90-92%  Esophageal dysmotility/stricture/Achalasia -GI consultappreciated-->no need for any urgent endoscopic therapy at the moment -seenat [facility] 10/18/2020 by Dr. Karie Ellison and Christian Ellison and he was set up to proceed with a repeat EGD with intraprocedural FLIP and Botox injection.  12/09/20--Christian Ellison--underwent EGD with  intraprocedural FLIP and Botox injection -undergoing eval for  POEM (per oral esophageal myotomy) at Christian Ellison -Continuefamotidine -continuediltiazem    Lactic Acidosis -3.0>>1.1  Anxiety/depression -Continue home dose alprazolam -PMP Aware reviewed--receives alprazolam1 mg#120 monthly  Hypothyroidism -Continue Synthroid  Recent left calcaneus/talus fracture -Status post ORIF 07/04/2020--Dr. Susa Ellison  OSA -continue CPAP  Discharge Instructions   Allergies as of 12/21/2020   No Known Allergies     Medication List    TAKE these medications   acetaminophen 325 MG tablet Commonly known as: TYLENOL Take 2 tablets (650 mg total) by mouth every 6 (six) hours as needed for mild pain (or Fever >/= 101).   albuterol 108 (90 Base) MCG/ACT inhaler Commonly known as: VENTOLIN HFA Inhale 2 puffs into the lungs every 6 (six) hours as needed for wheezing or shortness of breath.   ALPRAZolam 1 MG tablet Commonly known as: XANAX Take 2 mg by mouth at bedtime as needed for anxiety.   amoxicillin-clavulanate 875-125 MG tablet Commonly known as: AUGMENTIN Take 1 tablet by mouth every 12 (twelve) hours.   aspirin EC 81 MG tablet Take 1 tablet (81 mg total) by mouth daily with breakfast. What changed: when to take this   diltiazem 120 MG 24 hr capsule Commonly known as: DILACOR XR Take 1 capsule (120 mg total) by mouth daily.   Glucosamine HCl 1000 MG Tabs Take 1,000 mg by mouth at bedtime.   levothyroxine 25 MCG tablet Commonly known as: SYNTHROID TAKE 1 TABLET BY MOUTH EVERY DAY   multivitamin with minerals Tabs tablet Take 1 tablet by mouth daily. 50+   pantoprazole 40 MG tablet Commonly known as: Protonix Take 1 tablet (40 mg total) by mouth 2 (two) times daily.   TAGAMET PO Take 75 mg by mouth daily as needed (if he feels his food coming up after meal).       No Known Allergies  Consultations:  none   Procedures/Studies: DG Chest Port 1 View  Result Date: 12/20/2020 CLINICAL DATA:  Shortness of breath EXAM: PORTABLE CHEST 1 VIEW COMPARISON:  11/05/2020, CT 11/05/2020 FINDINGS: Interval development of  patchy airspace disease throughout the left greater than right thorax. No pleural effusion. Normal cardiomediastinal silhouette. No pneumothorax. IMPRESSION: Diffuse left greater than right patchy pulmonary opacity suspect for respiratory infection, potential atypical pneumonia. Electronically Signed   By: Jasmine Pang M.D.   On: 12/20/2020 00:00        Discharge Exam: Vitals:   12/21/20 0513 12/21/20 1415  BP: 135/73 132/74  Pulse: 82 69  Resp: 15 18  Temp: 98 F (36.7 C) 98.3 F (36.8 C)  SpO2: 90% 99%   Vitals:   12/20/20 2101 12/21/20 0125 12/21/20 0513 12/21/20 1415  BP: 138/81 126/80 135/73 132/74  Pulse: 85 85 82 69  Resp: 17 17 15 18   Temp: 100.1 F (37.8 C) 97.7 F (36.5 C) 98 F (36.7 C) 98.3 F (36.8 C)  TempSrc: Oral Oral  Oral  SpO2: (!) 88% (!) 88% 90% 99%  Weight:      Height:        General: Pt is alert, awake, not in acute distress Cardiovascular: RRR, S1/S2 +, no rubs, no gallops Respiratory: bilateral rales. No wheeze Abdominal: Soft, NT, ND, bowel sounds + Extremities: no edema, no cyanosis   The results of significant diagnostics from this hospitalization (including imaging, microbiology, ancillary and laboratory) are listed below for reference.    Significant Diagnostic Studies: DG Chest Port 1 View  Result Date: 12/20/2020 CLINICAL DATA:  Shortness of breath EXAM: PORTABLE CHEST  1 VIEW COMPARISON:  11/05/2020, CT 11/05/2020 FINDINGS: Interval development of patchy airspace disease throughout the left greater than right thorax. No pleural effusion. Normal cardiomediastinal silhouette. No pneumothorax. IMPRESSION: Diffuse left greater than right patchy pulmonary opacity suspect for respiratory infection, potential atypical pneumonia. Electronically Signed   By: Jasmine Pang M.D.   On: 12/20/2020 00:00     Microbiology: Recent Results (from the past 240 hour(s))  Blood Culture (routine x 2)     Status: None (Preliminary result)   Collection  Time: 12/19/20 11:56 PM   Specimen: BLOOD  Result Value Ref Range Status   Specimen Description BLOOD LEFT ANTECUBITAL  Final   Special Requests AEROBIC BOTTLE ONLY Blood Culture adequate volume  Final   Culture   Final    NO GROWTH 2 DAYS Performed at [facility], [address] [place], [place] [address]    Report Status PENDING  Incomplete  Blood Culture (routine x 2)     Status: None (Preliminary result)   Collection Time: 12/19/20 11:56 PM   Specimen: BLOOD  Result Value Ref Range Status   Specimen Description BLOOD RIGHT ANTECUBITAL  Final   Special Requests AEROBIC BOTTLE ONLY Blood Culture adequate volume  Final   Culture   Final    NO GROWTH 2 DAYS Performed at [facility], [address], [place], [place] [address]    Report Status PENDING  Incomplete  Resp Panel by RT-PCR (Flu A&B, Covid) Nasopharyngeal Swab     Status: None   Collection Time: 12/20/20 12:28 AM   Specimen: Nasopharyngeal Swab; Nasopharyngeal(NP) swabs in vial transport medium  Result Value Ref Range Status   SARS Coronavirus 2 by RT PCR NEGATIVE NEGATIVE Final    Comment: (NOTE) SARS-CoV-2 target nucleic acids are NOT DETECTED.  The SARS-CoV-2 RNA is generally detectable in upper respiratory specimens during the acute phase of infection. The lowest concentration of SARS-CoV-2 viral copies this assay can detect is 138 copies/mL. A negative result does not preclude SARS-Cov-2 infection and should not be used as the sole basis for treatment or other patient management decisions. A negative result may occur with  improper specimen collection/handling, submission of specimen other than nasopharyngeal swab, presence of viral mutation(s) within the areas targeted by this assay, and inadequate number of viral copies(<138 copies/mL). A negative result must be combined with clinical observations, patient history, and epidemiological information. The expected result is Negative.  Fact Sheet for Patients:   BloggerCourse.com  Fact Sheet for Healthcare Providers:  SeriousBroker.it  This test is no t yet approved or cleared by the [place] FDA and  has been authorized for detection and/or diagnosis of SARS-CoV-2 by FDA under an Emergency Use Authorization (EUA). This EUA will remain  in effect (meaning this test can be used) for the duration of the COVID-19 declaration under Section 564(b)(1) of the Act, 21 U.S.C.section 360bbb-3(b)(1), unless the authorization is terminated  or revoked sooner.       Influenza A by PCR NEGATIVE NEGATIVE Final   Influenza B by PCR NEGATIVE NEGATIVE Final    Comment: (NOTE) The Xpert Xpress SARS-CoV-2/FLU/RSV plus assay is intended as an aid in the diagnosis of influenza from Nasopharyngeal swab specimens and should not be used as a sole basis for treatment. Nasal washings and aspirates are unacceptable for Xpert Xpress SARS-CoV-2/FLU/RSV testing.  Fact Sheet for Patients: BloggerCourse.com  Fact Sheet for Healthcare Providers: SeriousBroker.it  This test is not yet approved or cleared by the [place] FDA and has  been authorized for detection and/or diagnosis of SARS-CoV-2 by FDA under an Emergency Use Authorization (EUA). This EUA will remain in effect (meaning this test can be used) for the duration of the COVID-19 declaration under Section 564(b)(1) of the Act, 21 U.S.C. section 360bbb-3(b)(1), unless the authorization is terminated or revoked.  Performed at [facility], [address] [place], [place] [address]   Urine culture     Status: None   Collection Time: 12/20/20  4:51 AM   Specimen: In/Out Cath Urine  Result Value Ref Range Status   Specimen Description   Final    IN/OUT CATH URINE Performed at [facility], [address] [place], [place] [address]    Special Requests   Final    NONE Performed at [facility],  [address] [place], [place] [address]    Culture   Final    NO GROWTH Performed at [facility], [address] [place], [place] [address]    Report Status 12/21/2020 FINAL  Final  MRSA PCR Screening     Status: None   Collection Time: 12/20/20  8:20 AM   Specimen: Nasopharyngeal  Result Value Ref Range Status   MRSA by PCR NEGATIVE NEGATIVE Final    Comment:        The GeneXpert MRSA Assay (FDA approved for NASAL specimens only), is one component of a comprehensive MRSA colonization surveillance program. It is not intended to diagnose MRSA infection nor to guide or monitor treatment for MRSA infections. Performed at [facility], [address] [place], [place] [address]      Labs: Basic Metabolic Panel: Recent Labs  Lab 12/19/20 2356 12/20/20 0521 12/21/20 0534  NA 138 139 137  K 3.9 3.9 3.4*  CL 100 106 98  CO2 29 28 31   GLUCOSE 122* 113* 108*  BUN 13 10 10   CREATININE 0.85 0.69 0.75  CALCIUM 8.7* 7.8* 8.6*  MG 2.0  --   --   PHOS 1.7*  --   --    Liver Function Tests: Recent Labs  Lab 12/19/20 2356 12/20/20 0521 12/21/20 0534  AST 20 13* 13*  ALT 18 12 13   ALKPHOS 68 53 56  BILITOT 0.5 0.6 1.2  PROT 7.9 6.1* 6.8  ALBUMIN 4.1 3.2* 3.3*   No results for input(s): LIPASE, AMYLASE in the last 168 hours. No results for input(s): AMMONIA in the last 168 hours. CBC: Recent Labs  Lab 12/19/20 2356 12/20/20 0521 12/21/20 0534  WBC 6.9 7.3 7.4  NEUTROABS 5.9 5.8  --   HGB 14.3 11.5* 11.4*  HCT 43.2 36.6* 36.9*  MCV 90.0 93.1 92.0  PLT 189 165 159   Cardiac Enzymes: No results for input(s): CKTOTAL, CKMB, CKMBINDEX, TROPONINI in the last 168 hours. BNP: Invalid input(s): POCBNP CBG: No results for input(s): GLUCAP in the last 168 hours.  Time coordinating discharge:  36 minutes  Signed:  Catarina Hartshornavid Dixie Coppa, DO [facility] Pager: [phone] 12/21/2020, 5:27 PM

## 2020-12-22 LAB — LEGIONELLA PNEUMOPHILA SEROGP 1 UR AG: L. pneumophila Serogp 1 Ur Ag: NEGATIVE

## 2020-12-23 ENCOUNTER — Emergency Department (HOSPITAL_COMMUNITY): Payer: 59

## 2020-12-23 ENCOUNTER — Inpatient Hospital Stay (HOSPITAL_COMMUNITY): Payer: 59

## 2020-12-23 ENCOUNTER — Encounter (HOSPITAL_COMMUNITY): Payer: Self-pay | Admitting: Emergency Medicine

## 2020-12-23 ENCOUNTER — Inpatient Hospital Stay (HOSPITAL_COMMUNITY)
Admission: EM | Admit: 2020-12-23 | Discharge: 2020-12-26 | DRG: 871 | Disposition: A | Discharge status: Home / Self Care | Payer: 59 | Attending: Family Medicine | Admitting: Family Medicine

## 2020-12-23 DIAGNOSIS — T17908A Unspecified foreign body in respiratory tract, part unspecified causing other injury, initial encounter: Secondary | ICD-10-CM

## 2020-12-23 DIAGNOSIS — Z79899 Other long term (current) drug therapy: Secondary | ICD-10-CM

## 2020-12-23 DIAGNOSIS — R739 Hyperglycemia, unspecified: Secondary | ICD-10-CM | POA: Diagnosis present

## 2020-12-23 DIAGNOSIS — Z841 Family history of disorders of kidney and ureter: Secondary | ICD-10-CM

## 2020-12-23 DIAGNOSIS — K220 Achalasia of cardia: Secondary | ICD-10-CM | POA: Diagnosis present

## 2020-12-23 DIAGNOSIS — Z9119 Patient's noncompliance with other medical treatment and regimen: Secondary | ICD-10-CM | POA: Diagnosis not present

## 2020-12-23 DIAGNOSIS — R6520 Severe sepsis without septic shock: Secondary | ICD-10-CM | POA: Diagnosis present

## 2020-12-23 DIAGNOSIS — J189 Pneumonia, unspecified organism: Secondary | ICD-10-CM | POA: Diagnosis present

## 2020-12-23 DIAGNOSIS — Z823 Family history of stroke: Secondary | ICD-10-CM

## 2020-12-23 DIAGNOSIS — Z8249 Family history of ischemic heart disease and other diseases of the circulatory system: Secondary | ICD-10-CM

## 2020-12-23 DIAGNOSIS — R918 Other nonspecific abnormal finding of lung field: Secondary | ICD-10-CM

## 2020-12-23 DIAGNOSIS — Z803 Family history of malignant neoplasm of breast: Secondary | ICD-10-CM | POA: Diagnosis not present

## 2020-12-23 DIAGNOSIS — Z8042 Family history of malignant neoplasm of prostate: Secondary | ICD-10-CM

## 2020-12-23 DIAGNOSIS — F419 Anxiety disorder, unspecified: Secondary | ICD-10-CM | POA: Diagnosis present

## 2020-12-23 DIAGNOSIS — Z79890 Hormone replacement therapy (postmenopausal): Secondary | ICD-10-CM | POA: Diagnosis not present

## 2020-12-23 DIAGNOSIS — F32A Depression, unspecified: Secondary | ICD-10-CM | POA: Diagnosis present

## 2020-12-23 DIAGNOSIS — Z20822 Contact with and (suspected) exposure to covid-19: Secondary | ICD-10-CM | POA: Diagnosis present

## 2020-12-23 DIAGNOSIS — A419 Sepsis, unspecified organism: Secondary | ICD-10-CM | POA: Diagnosis present

## 2020-12-23 DIAGNOSIS — E039 Hypothyroidism, unspecified: Secondary | ICD-10-CM | POA: Diagnosis present

## 2020-12-23 DIAGNOSIS — E663 Overweight: Secondary | ICD-10-CM | POA: Diagnosis present

## 2020-12-23 DIAGNOSIS — J690 Pneumonitis due to inhalation of food and vomit: Secondary | ICD-10-CM | POA: Diagnosis present

## 2020-12-23 DIAGNOSIS — Z8052 Family history of malignant neoplasm of bladder: Secondary | ICD-10-CM | POA: Diagnosis not present

## 2020-12-23 DIAGNOSIS — D649 Anemia, unspecified: Secondary | ICD-10-CM | POA: Diagnosis present

## 2020-12-23 DIAGNOSIS — Z800 Family history of malignant neoplasm of digestive organs: Secondary | ICD-10-CM

## 2020-12-23 DIAGNOSIS — G4733 Obstructive sleep apnea (adult) (pediatric): Secondary | ICD-10-CM | POA: Diagnosis present

## 2020-12-23 DIAGNOSIS — J9601 Acute respiratory failure with hypoxia: Secondary | ICD-10-CM | POA: Diagnosis present

## 2020-12-23 DIAGNOSIS — Z6829 Body mass index (BMI) 29.0-29.9, adult: Secondary | ICD-10-CM

## 2020-12-23 LAB — BRAIN NATRIURETIC PEPTIDE
B Natriuretic Peptide: 19 pg/mL (ref 0.0–100.0)
B Natriuretic Peptide: 25 pg/mL (ref 0.0–100.0)

## 2020-12-23 LAB — BLOOD GAS, ARTERIAL
Acid-Base Excess: 5.6 mmol/L — ABNORMAL HIGH (ref 0.0–2.0)
Bicarbonate: 28.6 mmol/L — ABNORMAL HIGH (ref 20.0–28.0)
FIO2: 40
O2 Saturation: 94.4 %
Patient temperature: 37
pCO2 arterial: 55.3 mmHg — ABNORMAL HIGH (ref 32.0–48.0)
pH, Arterial: 7.365 (ref 7.350–7.450)
pO2, Arterial: 75.2 mmHg — ABNORMAL LOW (ref 83.0–108.0)

## 2020-12-23 LAB — COMPREHENSIVE METABOLIC PANEL
ALT: 11 U/L (ref 0–44)
AST: 10 U/L — ABNORMAL LOW (ref 15–41)
Albumin: 3.3 g/dL — ABNORMAL LOW (ref 3.5–5.0)
Alkaline Phosphatase: 55 U/L (ref 38–126)
Anion gap: 7 (ref 5–15)
BUN: 14 mg/dL (ref 6–20)
CO2: 32 mmol/L (ref 22–32)
Calcium: 8.2 mg/dL — ABNORMAL LOW (ref 8.9–10.3)
Chloride: 99 mmol/L (ref 98–111)
Creatinine, Ser: 0.7 mg/dL (ref 0.61–1.24)
GFR, Estimated: >60 mL/min (ref >60)
Glucose, Bld: 132 mg/dL — ABNORMAL HIGH (ref 70–99)
Potassium: 3.8 mmol/L (ref 3.5–5.1)
Sodium: 138 mmol/L (ref 135–145)
Total Bilirubin: 0.4 mg/dL (ref 0.3–1.2)
Total Protein: 7.2 g/dL (ref 6.5–8.1)

## 2020-12-23 LAB — URINALYSIS, ROUTINE W REFLEX MICROSCOPIC
Bacteria, UA: NONE SEEN
Bilirubin Urine: NEGATIVE
Glucose, UA: NEGATIVE mg/dL
Hgb urine dipstick: NEGATIVE
Ketones, ur: 5 mg/dL — AB
Leukocytes,Ua: NEGATIVE
Nitrite: NEGATIVE
Protein, ur: 30 mg/dL — AB
Specific Gravity, Urine: 1.023 (ref 1.005–1.030)
pH: 6 (ref 5.0–8.0)

## 2020-12-23 LAB — RAPID URINE DRUG SCREEN, HOSP PERFORMED
Amphetamines: NOT DETECTED
Barbiturates: NOT DETECTED
Benzodiazepines: POSITIVE — AB
Cocaine: NOT DETECTED
Opiates: NOT DETECTED
Tetrahydrocannabinol: POSITIVE — AB

## 2020-12-23 LAB — CBC
HCT: 36.8 % — ABNORMAL LOW (ref 39.0–52.0)
Hemoglobin: 11.7 g/dL — ABNORMAL LOW (ref 13.0–17.0)
MCH: 28.8 pg (ref 26.0–34.0)
MCHC: 31.8 g/dL (ref 30.0–36.0)
MCV: 90.6 fL (ref 80.0–100.0)
Platelets: 188 10*3/uL (ref 150–400)
RBC: 4.06 MIL/uL — ABNORMAL LOW (ref 4.22–5.81)
RDW: 12.9 % (ref 11.5–15.5)
WBC: 6 10*3/uL (ref 4.0–10.5)
nRBC: 0 % (ref 0.0–0.2)

## 2020-12-23 LAB — TROPONIN I (HIGH SENSITIVITY)
Troponin I (High Sensitivity): 6 ng/L (ref <18)
Troponin I (High Sensitivity): 5 ng/L (ref <18)

## 2020-12-23 LAB — RESP PANEL BY RT-PCR (FLU A&B, COVID) ARPGX2
Influenza A by PCR: NEGATIVE
Influenza B by PCR: NEGATIVE
SARS Coronavirus 2 by RT PCR: NEGATIVE

## 2020-12-23 LAB — LACTIC ACID, PLASMA: Lactic Acid, Venous: 0.6 mmol/L (ref 0.5–1.9)

## 2020-12-23 LAB — TSH: TSH: 1.609 u[IU]/mL (ref 0.350–4.500)

## 2020-12-23 MED ORDER — ENOXAPARIN SODIUM 40 MG/0.4ML IJ SOSY
40.0000 mg | PREFILLED_SYRINGE | INTRAMUSCULAR | Status: DC
  Filled 2020-12-23 (×3): qty 0.4

## 2020-12-23 MED ORDER — SODIUM CHLORIDE 0.9 % IV SOLN
2.0000 g | Freq: Once | INTRAVENOUS | Status: AC
  Administered 2020-12-23: 2 g via INTRAVENOUS
  Filled 2020-12-23: qty 2

## 2020-12-23 MED ORDER — DILTIAZEM HCL ER COATED BEADS 120 MG PO CP24
120.0000 mg | ORAL_CAPSULE | Freq: Every day | ORAL | Status: DC
  Administered 2020-12-23 – 2020-12-26 (×4): 120 mg via ORAL
  Filled 2020-12-23 (×4): qty 1

## 2020-12-23 MED ORDER — SODIUM CHLORIDE 0.9 % IV SOLN
500.0000 mg | INTRAVENOUS | Status: DC
  Administered 2020-12-23 – 2020-12-24 (×2): 500 mg via INTRAVENOUS
  Filled 2020-12-23 (×3): qty 500

## 2020-12-23 MED ORDER — IOHEXOL 300 MG/ML  SOLN
75.0000 mL | Freq: Once | INTRAMUSCULAR | Status: AC | PRN
  Administered 2020-12-23: 75 mL via INTRAVENOUS

## 2020-12-23 MED ORDER — VANCOMYCIN HCL 2000 MG/400ML IV SOLN
2000.0000 mg | Freq: Once | INTRAVENOUS | Status: AC
  Administered 2020-12-23: 2000 mg via INTRAVENOUS
  Filled 2020-12-23: qty 400

## 2020-12-23 MED ORDER — LEVOTHYROXINE SODIUM 25 MCG PO TABS
25.0000 ug | ORAL_TABLET | Freq: Every day | ORAL | Status: DC
  Administered 2020-12-24 – 2020-12-26 (×3): 25 ug via ORAL
  Filled 2020-12-23 (×3): qty 1

## 2020-12-23 MED ORDER — IPRATROPIUM BROMIDE 0.02 % IN SOLN
0.5000 mg | Freq: Four times a day (QID) | RESPIRATORY_TRACT | Status: DC
  Administered 2020-12-23 – 2020-12-24 (×2): 0.5 mg via RESPIRATORY_TRACT
  Filled 2020-12-23 (×2): qty 2.5

## 2020-12-23 MED ORDER — SODIUM CHLORIDE 0.9 % IV SOLN: 2.0000 g | Freq: Three times a day (TID) | INTRAVENOUS | Status: DC

## 2020-12-23 MED ORDER — HYDRALAZINE HCL 20 MG/ML IJ SOLN: 10.0000 mg | Freq: Four times a day (QID) | INTRAMUSCULAR | Status: DC | PRN

## 2020-12-23 MED ORDER — FAMOTIDINE 20 MG PO TABS: 20.0000 mg | ORAL_TABLET | Freq: Every day | ORAL | Status: DC | PRN

## 2020-12-23 MED ORDER — ADULT MULTIVITAMIN W/MINERALS CH
1.0000 | ORAL_TABLET | Freq: Every day | ORAL | Status: DC
  Administered 2020-12-23 – 2020-12-26 (×4): 1 via ORAL
  Filled 2020-12-23 (×4): qty 1

## 2020-12-23 MED ORDER — METRONIDAZOLE 500 MG/100ML IV SOLN
500.0000 mg | Freq: Three times a day (TID) | INTRAVENOUS | Status: DC
  Administered 2020-12-23 – 2020-12-25 (×7): 500 mg via INTRAVENOUS
  Filled 2020-12-23 (×7): qty 100

## 2020-12-23 MED ORDER — LEVALBUTEROL HCL 0.63 MG/3ML IN NEBU
0.6300 mg | INHALATION_SOLUTION | Freq: Four times a day (QID) | RESPIRATORY_TRACT | Status: DC
  Administered 2020-12-23 – 2020-12-24 (×2): 0.63 mg via RESPIRATORY_TRACT
  Filled 2020-12-23 (×2): qty 3

## 2020-12-23 MED ORDER — ACETAMINOPHEN 325 MG PO TABS: 650.0000 mg | ORAL_TABLET | Freq: Four times a day (QID) | ORAL | Status: DC | PRN

## 2020-12-23 MED ORDER — ACETAMINOPHEN 650 MG RE SUPP: 650.0000 mg | Freq: Four times a day (QID) | RECTAL | Status: DC | PRN

## 2020-12-23 MED ORDER — ALBUTEROL SULFATE (2.5 MG/3ML) 0.083% IN NEBU: 5.0000 mg | INHALATION_SOLUTION | Freq: Once | RESPIRATORY_TRACT | Status: DC

## 2020-12-23 MED ORDER — IPRATROPIUM BROMIDE 0.02 % IN SOLN: 0.5000 mg | Freq: Once | RESPIRATORY_TRACT | Status: DC

## 2020-12-23 MED ORDER — POTASSIUM CHLORIDE IN NACL 20-0.9 MEQ/L-% IV SOLN: INTRAVENOUS | Status: DC

## 2020-12-23 MED ORDER — ALBUTEROL SULFATE (2.5 MG/3ML) 0.083% IN NEBU
2.5000 mg | INHALATION_SOLUTION | Freq: Once | RESPIRATORY_TRACT | Status: AC
  Administered 2020-12-23: 2.5 mg via RESPIRATORY_TRACT
  Filled 2020-12-23: qty 3

## 2020-12-23 MED ORDER — ALBUTEROL SULFATE (2.5 MG/3ML) 0.083% IN NEBU: 2.5000 mg | INHALATION_SOLUTION | RESPIRATORY_TRACT | Status: DC | PRN

## 2020-12-23 MED ORDER — GUAIFENESIN ER 600 MG PO TB12
600.0000 mg | ORAL_TABLET | Freq: Two times a day (BID) | ORAL | Status: DC
  Administered 2020-12-23 – 2020-12-26 (×6): 600 mg via ORAL
  Filled 2020-12-23 (×6): qty 1

## 2020-12-23 MED ORDER — PANTOPRAZOLE SODIUM 40 MG PO TBEC
40.0000 mg | DELAYED_RELEASE_TABLET | Freq: Two times a day (BID) | ORAL | Status: DC
  Administered 2020-12-23 – 2020-12-26 (×6): 40 mg via ORAL
  Filled 2020-12-23 (×6): qty 1

## 2020-12-23 MED ORDER — SENNA 8.6 MG PO TABS
1.0000 | ORAL_TABLET | Freq: Two times a day (BID) | ORAL | Status: DC
  Administered 2020-12-23 – 2020-12-26 (×5): 8.6 mg via ORAL
  Filled 2020-12-23 (×6): qty 1

## 2020-12-23 MED ORDER — IPRATROPIUM-ALBUTEROL 0.5-2.5 (3) MG/3ML IN SOLN
3.0000 mL | Freq: Once | RESPIRATORY_TRACT | Status: AC
  Administered 2020-12-23: 3 mL via RESPIRATORY_TRACT
  Filled 2020-12-23: qty 3

## 2020-12-23 MED ORDER — ASPIRIN EC 81 MG PO TBEC
81.0000 mg | DELAYED_RELEASE_TABLET | Freq: Every day | ORAL | Status: DC
  Administered 2020-12-23 – 2020-12-25 (×3): 81 mg via ORAL
  Filled 2020-12-23 (×3): qty 1

## 2020-12-23 MED ORDER — VANCOMYCIN HCL IN DEXTROSE 1-5 GM/200ML-% IV SOLN: 1000.0000 mg | Freq: Three times a day (TID) | INTRAVENOUS | Status: DC

## 2020-12-23 MED ORDER — SODIUM CHLORIDE 0.9 % IV SOLN
2.0000 g | INTRAVENOUS | Status: DC
  Administered 2020-12-23 – 2020-12-24 (×2): 2 g via INTRAVENOUS
  Filled 2020-12-23 (×2): qty 20

## 2020-12-23 MED ORDER — ONDANSETRON HCL 4 MG/2ML IJ SOLN: 4.0000 mg | Freq: Four times a day (QID) | INTRAMUSCULAR | Status: DC | PRN

## 2020-12-23 MED ORDER — SODIUM CHLORIDE 0.9 % IV BOLUS
1000.0000 mL | Freq: Once | INTRAVENOUS | Status: AC
  Administered 2020-12-23: 1000 mL via INTRAVENOUS

## 2020-12-23 MED ORDER — ONDANSETRON HCL 4 MG PO TABS: 4.0000 mg | ORAL_TABLET | Freq: Four times a day (QID) | ORAL | Status: DC | PRN

## 2020-12-23 MED ORDER — POLYETHYLENE GLYCOL 3350 17 G PO PACK: 17.0000 g | PACK | Freq: Every day | ORAL | Status: DC | PRN

## 2020-12-23 MED ORDER — ALBUTEROL SULFATE HFA 108 (90 BASE) MCG/ACT IN AERS: 4.0000 | INHALATION_SPRAY | Freq: Once | RESPIRATORY_TRACT | Status: DC

## 2020-12-23 NOTE — Progress Notes (Signed)
Pharmacy Antibiotic Note  Christian Ellison is a 60 y.o. male admitted on 12/23/2020 with pneumonia.  Pharmacy has been consulted for Vancomycin/Cefepime dosing.  The pt has a history of esophageal dysmotility/aspiration pneumonia. WBC WNL. Renal function good.  Plan: Vancomycin  2000mg  loading dose, then 1000 mg IV q8h >>Estimated AUC: 505 Cefepime 2g IV q8h Trend WBC, temp, renal function  F/U infectious work-up Drug levels as indicated  Height: 6' (182.9 cm) Weight: 97.5 kg (215 lb) IBW/kg (Calculated) : 77.6  Temp (24hrs), Avg:97.9 F (36.6 C), Min:97.9 F (36.6 C), Max:97.9 F (36.6 C)  Recent Labs  Lab 12/19/20 2356 12/20/20 0145 12/20/20 0521 12/20/20 0800 12/21/20 0534 12/23/20 1109  WBC 6.9  --  7.3  --  7.4 6.0  CREATININE 0.85  --  0.69  --  0.75  --   LATICACIDVEN 3.0* 2.1* 1.3 1.1  --  0.6    Estimated Creatinine Clearance: 120.4 mL/min (by C-G formula based on SCr of 0.75 mg/dL).    No Known Allergies   Antimicrobials this admission: 5/9 Cefepime  >>  5/9 Vancomycin >>   Microbiology results: 5/9 BCx: pending 5/9 UCx: pending MRSA PCR:   7/9, BS Elder Cyphers, BCPS Clinical Pharmacist Pager [phone]  12/23/2020 11:41 AM

## 2020-12-23 NOTE — ED Notes (Signed)
Pt at side of bed, urinal provided for pt, needing to urinate.  Minute later, heard a noise, went in room to find pt in the floor.  Pt got up from floor, pt had pulled IV out, dsg applied to site due to bleeding.  CN notified and went in room as well. Vitals taken. EDP made aware of fall as well. Pt cussing at staff.

## 2020-12-23 NOTE — H&P (Signed)
History and Physical    Christian Ellison DOB: [birth year] DOA: 12/23/2020  PCP: Anabel Halon, MD   Patient coming from: Home  Chief Complaint: SOB, Dysnpea and Hypoxia   HPI: Christian Ellison is a 60 y.o. 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  Recently was admitted on 12/20/2020 for shortness of breath and aspiration pneumonia and subsequently signed out AGAINST MEDICAL ADVICE on 12/21/2020.  Patient went home and he started having increasing worsening shortness of breath.  EMS was called and upon arrival his O2 saturations were noted to be in 60s.  They placed patient on supplemental oxygen and he undergone 6 L with saturation in the mid 60s.  He denies chest pain or discomfort but has notices increasing productive cough.  Denied any flu or COVID exposures and denies any new or recent aspiration or choking meds and wants to eat a cheeseburger.  Denied headache or chest pain on evaluation but was extremely somnolent and drowsy at the time of admission.  Right prior to me evaluating patient patient fell in the ED hitting his head after he tried to use a urinal.  He was asked admit this patient for his worsening pneumonia and acute respiratory failure with hypoxia as well as severe sepsis from his multifocal pneumonia.  ED Course: In the ED patient had basic blood work done as well as an EKG and a chest x-ray.  He is given IV vancomycin and IV cefepime and given 2 breathing treatments and  1 remaining albuterol inhaler as well as albuterol neb and DuoNeb.  Of note coronavirus testing would not be done and is still pending and blood cultures are being obtained now.  Review of Systems: As per HPI otherwise all other systems reviewed and negative.   Past Medical History:  Diagnosis Date  . Anxiety   . Aspiration pneumonia (HCC) 05/26/2018   RLL  . Chronic pain   . Closed fracture of xiphoid process   . Esophageal stricture   . GERD (gastroesophageal reflux disease)   . 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   Marland Kitchen Opiate abuse, continuous (HCC)   . Pulmonary edema 02/02/2018  . Sepsis due to pneumonia (HCC) 03/11/2019  . Sleep apnea    CPAP nightly   Past Surgical History:  Procedure Laterality Date  . BIOPSY  03/18/2018   Procedure: BIOPSY;  Surgeon: Malissa Hippo, MD;  [facility];  Service: Endoscopy;;  gastric   . COLONOSCOPY N/A 11/22/2013   Dr. Karilyn Cota: Normal except for hemorrhoids.  Next colonoscopy 10 years.  . ESOPHAGEAL DILATION N/A 03/18/2018   Procedure: ESOPHAGEAL DILATION;  Surgeon: Malissa Hippo, MD;  [facility];  Service: Endoscopy;  Laterality: N/A;  . ESOPHAGEAL DILATION N/A 08/07/2020   Procedure: ESOPHAGEAL DILATION;  Surgeon: Malissa Hippo, MD;  [facility];  Service: Endoscopy;  Laterality: N/A;  . ESOPHAGEAL MANOMETRY N/A 06/15/2018   Procedure: ESOPHAGEAL MANOMETRY (EM);  Surgeon: Napoleon Form, MD;  Location: [facility];  Service: Endoscopy;  Laterality:  N/A;  . ESOPHAGOGASTRODUODENOSCOPY (EGD) WITH PROPOFOL N/A 03/18/2018   Dr. Karilyn Cota: Benign-appearing mild esophageal stenosis proximal to the GE J, status post dilation.  2 cm hiatal hernia.  Gastritis, biopsies negative for H. pylori  . ESOPHAGOGASTRODUODENOSCOPY (EGD) WITH PROPOFOL N/A 08/07/2020   Procedure: ESOPHAGOGASTRODUODENOSCOPY (EGD) WITH PROPOFOL;  Surgeon: Malissa Hippo, MD;  [facility];  Service: Endoscopy;   Laterality: N/A;  . Fracture Right Leg     Patient has rod/screws in this leg  . ORIF CALCANEOUS FRACTURE Left 07/04/2020   Procedure: OPEN REDUCTION INTERNAL FIXATION (ORIF) LEFT TALUS AND CALCANEOUS FRACTURE, SUBTALAR JOINT ARTHROTOMY WITH REMOVAL OF LOOSE BODIES, TALONAVICULAR JOINT ARTHOTOMY WITH REMOVAL OF LOOSE BODIES;  Surgeon: Terance Hart, MD;  Location: [facility];  Service: Orthopedics;  Laterality: Left;  . Rotor Cuff  2012   Right Shoulder   SOCIAL HISTORY  reports that he has never smoked. He has never used smokeless tobacco. He reports previous alcohol use. He reports previous drug use. Drug: Benzodiazepines.  ALLERGIES No Known Allergies  Family History  Problem Relation Age of Onset  . Breast cancer Mother   . Kidney failure Mother   . Stroke Mother   . Colon cancer Father   . Bladder Cancer Father   . Prostate cancer Father   . Hypertension Sister   . Hypothyroidism Sister   . Healthy Sister   . Healthy Daughter   . Healthy Son   . Healthy Son    Prior to Admission medications   Medication Sig Start Date End Date Taking? Authorizing Provider  acetaminophen (TYLENOL) 325 MG tablet Take 2 tablets (650 mg total) by mouth every 6 (six) hours as needed for mild pain (or Fever >/= 101). 05/17/19  Yes Emokpae, Courage, MD  albuterol (VENTOLIN HFA) 108 (90 Base) MCG/ACT inhaler Inhale 2 puffs into the lungs every 6 (six) hours as needed for wheezing or shortness of breath. 09/09/20  Yes Allena Katz, Earlie Lou, MD  ALPRAZolam Prudy Feeler) 1 MG tablet Take 2 mg by mouth at bedtime as needed for anxiety. 10/22/19  Yes [provider]  aspirin EC 81 MG tablet Take 1 tablet (81 mg total) by mouth daily with breakfast. Patient taking differently: Take 81 mg by mouth at bedtime. 03/13/19  Yes Shon Hale, MD  Cimetidine (TAGAMET PO) Take 75 mg by mouth daily as needed (if he feels his food coming up after meal).   Yes [provider]  diltiazem (DILACOR XR) 120 MG 24  hr capsule Take 1 capsule (120 mg total) by mouth daily. 08/17/20  Yes Danford, Earl Lites, MD  levothyroxine (SYNTHROID) 25 MCG tablet TAKE 1 TABLET BY MOUTH EVERY DAY Patient taking differently: Take 25 mcg by mouth daily before breakfast. 12/17/20  Yes Anabel Halon, MD  Multiple Vitamin (MULTIVITAMIN WITH MINERALS) TABS tablet Take 1 tablet by mouth daily. 50+   Yes [provider]  pantoprazole (PROTONIX) 40 MG tablet Take 1 tablet (40 mg total) by mouth 2 (two) times daily. 08/07/20 08/07/21 Yes Shon Hale, MD  amoxicillin-clavulanate (AUGMENTIN) 875-125 MG tablet Take 1 tablet by mouth every 12 (twelve) hours. Patient not taking: Reported on 12/23/2020 12/21/20   Catarina Hartshorn, MD    Physical Exam: Vitals:   12/23/20 1437 12/23/20 1441 12/23/20 1500 12/23/20 1514  BP:    122/70  Pulse:   87 94  Resp:   15   Temp:   98.6 F (37 C)   TempSrc:   Oral  SpO2: 96% 97%    Weight:      Height:       Constitutional: WN/WD overweight Caucasian male who is extremely somnolent and drowsy Eyes: Lids and conjunctivae normal, sclerae anicteric  ENMT: External Ears, Nose appear normal. Grossly normal hearing. Neck: Appears normal, supple, no cervical masses, normal ROM, no appreciable thyromegaly; no appreciable JVD Respiratory: Diminished to auscultation bilaterally with coarse breath sounds and some rhonchi noted bilaterally patient has no appreciable wheezing, rales or crackles noted.  He has increased respiratory effort and is wearing supplemental oxygen via nasal cannula Cardiovascular: Slightly tachycardic, no murmurs / rubs / gallops. S1 and S2 auscultated.  Trace extremity edema Abdomen: Soft, non-tender, distended secondary to body habitus.  Bowel sounds positive.  GU: Deferred. Musculoskeletal: No clubbing / cyanosis of digits/nails. No joint deformity upper and lower extremities.  Skin: No rashes, lesions, ulcers. No induration; Warm and dry.  Neurologic: The patient is  extremely somnolent and drowsy but arousable and cranial nerves II through XII grossly intact Psychiatric: Normal judgment and insight.  He is a somnolent and drowsy but awake.  Slightly anxious mood and appropriate affect.   Labs on Admission: I have personally reviewed following labs and imaging studies  CBC: Recent Labs  Lab 12/19/20 2356 12/20/20 0521 12/21/20 0534 12/23/20 1109  WBC 6.9 7.3 7.4 6.0  NEUTROABS 5.9 5.8  --   --   HGB 14.3 11.5* 11.4* 11.7*  HCT 43.2 36.6* 36.9* 36.8*  MCV 90.0 93.1 92.0 90.6  PLT 189 165 159 188   Basic Metabolic Panel: Recent Labs  Lab 12/19/20 2356 12/20/20 0521 12/21/20 0534 12/23/20 1109  NA 138 139 137 138  K 3.9 3.9 3.4* 3.8  CL 100 106 98 99  CO2 32  GLUCOSE 122* 113* 108* 132*  BUN CREATININE 0.85 0.69 0.75 0.70  CALCIUM 8.7* 7.8* 8.6* 8.2*  MG 2.0  --   --   --   PHOS 1.7*  --   --   --    GFR: Estimated Creatinine Clearance: 120.4 mL/min (by C-G formula based on SCr of 0.7 mg/dL). Liver Function Tests: Recent Labs  Lab 12/19/20 2356 12/20/20 0521 12/21/20 0534 12/23/20 1109  AST 20 13* 13* 10*  ALT ALKPHOS 68 53 56 55  BILITOT 0.5 0.6 1.2 0.4  PROT 7.9 6.1* 6.8 7.2  ALBUMIN 4.1 3.2* 3.3* 3.3*   No results for input(s): LIPASE, AMYLASE in the last 168 hours. No results for input(s): AMMONIA in the last 168 hours. Coagulation Profile: Recent Labs  Lab 12/19/20 2356 12/21/20 0534  INR 0.9 1.1   Cardiac Enzymes: No results for input(s): CKTOTAL, CKMB, CKMBINDEX, TROPONINI in the last 168 hours. BNP (last 3 results) No results for input(s): PROBNP in the last 8760 hours. HbA1C: No results for input(s): HGBA1C in the last 72 hours. CBG: No results for input(s): GLUCAP in the last 168 hours. Lipid Profile: No results for input(s): CHOL, HDL, LDLCALC, TRIG, CHOLHDL, LDLDIRECT in the last 72 hours. Thyroid Function Tests: No results for input(s): TSH, T4TOTAL, FREET4,  T3FREE, THYROIDAB in the last 72 hours. Anemia Panel: No results for input(s): VITAMINB12, FOLATE, FERRITIN, TIBC, IRON, RETICCTPCT in the last 72 hours. Urine analysis:    Component Value Date/Time   COLORURINE COLORLESS (A) 12/20/2020 0451   APPEARANCEUR CLEAR 12/20/2020 0451   LABSPEC 1.004 (L) 12/20/2020 0451   PHURINE 7.0 12/20/2020 0451  GLUCOSEU NEGATIVE 12/20/2020 0451   HGBUR SMALL (A) 12/20/2020 0451   BILIRUBINUR NEGATIVE 12/20/2020 0451   KETONESUR NEGATIVE 12/20/2020 0451   PROTEINUR NEGATIVE 12/20/2020 0451   NITRITE NEGATIVE 12/20/2020 0451   LEUKOCYTESUR NEGATIVE 12/20/2020 0451   Sepsis Labs: !!!!!!!!!!!!!!!!!!!!!!!!!!!!!!!!!!!!!!!!!!!! (procalcitonin:4,lacticidven:4) ) Recent Results (from the past 240 hour(s))  Blood Culture (routine x 2)     Status: None (Preliminary result)   Collection Time: 12/19/20 11:56 PM   Specimen: BLOOD  Result Value Ref Range Status   Specimen Description BLOOD LEFT ANTECUBITAL  Final   Special Requests AEROBIC BOTTLE ONLY Blood Culture adequate volume  Final   Culture   Final    NO GROWTH 4 DAYS Performed at [facility], [address] [place], [place] [address]    Report Status PENDING  Incomplete  Blood Culture (routine x 2)     Status: None (Preliminary result)   Collection Time: 12/19/20 11:56 PM   Specimen: BLOOD  Result Value Ref Range Status   Specimen Description BLOOD RIGHT ANTECUBITAL  Final   Special Requests AEROBIC BOTTLE ONLY Blood Culture adequate volume  Final   Culture   Final    NO GROWTH 4 DAYS Performed at [facility], [address] [place], [place] [address]    Report Status PENDING  Incomplete  Resp Panel by RT-PCR (Flu A&B, Covid) Nasopharyngeal Swab     Status: None   Collection Time: 12/20/20 12:28 AM   Specimen: Nasopharyngeal Swab; Nasopharyngeal(NP) swabs in vial transport medium  Result Value Ref Range Status   SARS Coronavirus 2 by RT PCR NEGATIVE NEGATIVE Final    Comment:  (NOTE) SARS-CoV-2 target nucleic acids are NOT DETECTED.  The SARS-CoV-2 RNA is generally detectable in upper respiratory specimens during the acute phase of infection. The lowest concentration of SARS-CoV-2 viral copies this assay can detect is 138 copies/mL. A negative result does not preclude SARS-Cov-2 infection and should not be used as the sole basis for treatment or other patient management decisions. A negative result may occur with  improper specimen collection/handling, submission of specimen other than nasopharyngeal swab, presence of viral mutation(s) within the areas targeted by this assay, and inadequate number of viral copies(<138 copies/mL). A negative result must be combined with clinical observations, patient history, and epidemiological information. The expected result is Negative.  Fact Sheet for Patients:  BloggerCourse.com  Fact Sheet for Healthcare Providers:  SeriousBroker.it  This test is no t yet approved or cleared by the [place] FDA and  has been authorized for detection and/or diagnosis of SARS-CoV-2 by FDA under an Emergency Use Authorization (EUA). This EUA will remain  in effect (meaning this test can be used) for the duration of the COVID-19 declaration under Section 564(b)(1) of the Act, 21 U.S.C.section 360bbb-3(b)(1), unless the authorization is terminated  or revoked sooner.       Influenza A by PCR NEGATIVE NEGATIVE Final   Influenza B by PCR NEGATIVE NEGATIVE Final    Comment: (NOTE) The Xpert Xpress SARS-CoV-2/FLU/RSV plus assay is intended as an aid in the diagnosis of influenza from Nasopharyngeal swab specimens and should not be used as a sole basis for treatment. Nasal washings and aspirates are unacceptable for Xpert Xpress SARS-CoV-2/FLU/RSV testing.  Fact Sheet for Patients: BloggerCourse.com  Fact Sheet for Healthcare  Providers: SeriousBroker.it  This test is not yet approved or cleared by the [place] FDA and has been authorized for detection and/or diagnosis of SARS-CoV-2 by FDA under an Emergency Use Authorization (EUA).  This EUA will remain in effect (meaning this test can be used) for the duration of the COVID-19 declaration under Section 564(b)(1) of the Act, 21 U.S.C. section 360bbb-3(b)(1), unless the authorization is terminated or revoked.  Performed at [facility], [address] [place], [place] [address]   Urine culture     Status: None   Collection Time: 12/20/20  4:51 AM   Specimen: In/Out Cath Urine  Result Value Ref Range Status   Specimen Description   Final    IN/OUT CATH URINE Performed at [facility], [address] [place], [place] [address]    Special Requests   Final    NONE Performed at [facility], [address] [place], [place] [address]    Culture   Final    NO GROWTH Performed at [facility], [address] [place], [place] [address]    Report Status 12/21/2020 FINAL  Final  MRSA PCR Screening     Status: None   Collection Time: 12/20/20  8:20 AM   Specimen: Nasopharyngeal  Result Value Ref Range Status   MRSA by PCR NEGATIVE NEGATIVE Final    Comment:        The GeneXpert MRSA Assay (FDA approved for NASAL specimens only), is one component of a comprehensive MRSA colonization surveillance program. It is not intended to diagnose MRSA infection nor to guide or monitor treatment for MRSA infections. Performed at [facility], [address] [place], [place] [address]      Radiological Exams on Admission: DG Chest [facility]  Result Date: 12/23/2020 CLINICAL DATA:  Pt brought in by EMS for shortness of breath. Pt has extensive history of aspiration pneumonia. Pt was brought to the hospital last week for pneumonia but left AMA. Pt treated for sepsis by EMS. Pt seemed very confused and asked where he was at but was  able to state name and DOB. Hx of sepsis due to pneumonia 03/11/2019, PE 02/02/2018, and aspiration pneumonia 05/26/2018. Covid test negative 12/20/2020. EXAM: PORTABLE CHEST 1 VIEW COMPARISON:  12/19/2020 and older exams. FINDINGS: Heart normal in size.  No mediastinal or hilar masses. Interstitial and small patchy areas of airspace opacity are noted in the left lung, minimally in the right mid to lower lung, similar to the prior chest radiograph. Are small areas of patchy opacity in the left upper lobe that appear new from the prior study. No pleural effusion or pneumothorax. Skeletal structures are grossly intact. IMPRESSION: 1. Interstitial and patchy airspace lung opacities, mostly on the left, with a small area of increased opacity in the left upper lung compared to the recent prior study. No other change from the 12/19/2020 exam. Findings consistent with multifocal, possibly atypical, pneumonia. No evidence of pulmonary edema. Electronically Signed   By: Amie Portlandavid  Ormond M.D.   On: 12/23/2020 11:01   EKG: Independently reviewed. Showed a Sinus Tachycardia of 100 bpm and a QTc of 440 ms. No evidence of ST Elevation on my interpretation.   Assessment/Plan Active Problems:   Multifocal pneumonia  Acute Respiratory Failure with Hypoxia in the setting of Multifocal PNA and ? Aspiration in the setting of Achalasia and Known Dysmotility  -Patient presents with SOB and a Room Air Saturation <90% (60%) -SpO2: 97 % O2 Flow Rate (L/min): 5 L/min -ABG to be obtained; Check Respiratory Virus Panel -Chest X-Ray done and showed "Interstitial and patchy airspace lung opacities, mostly on the left, with a  small area of increased opacity in the left  upper lung compared to the recent prior study. No other change from the 12/19/2020 exam. Findings consistent with multifocal, possibly atypical, pneumonia. 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 and Timed Barium Swallow -Aspiration Precautions -Given IV Vanc and Cefepime in the ED; Will change to IV Ceftriaxone, IV Azithromycin, and IV Flagyl -Keep on FULL Liquid Diet as recommendations and obtain SLP evaluation again; Last Hospitalization was supposed to be on a FULL Liquid Diet with Softer Pudding Texture Meals/Ice Cream  -GI recommended management of his dysmotility at [facility] gastroenterology determine eligibility for POEM versus Heller myomotomy  Severe Sepsis 2/2 to Multifocal PNA and likely Aspiration PNA, poA -Presented with Tachycardia, and Tachypenia and Acute Respiratory Failure with Hypoxia in the setting of Multifocal PNA and likely from Aspiration; Liklely a continuation from last hospitalization  -Give 1 Liter Bolus -Abx as above -Start Maintenance IVF with NS at 75 mL/hr -Check Blood Cx x2 -Check U/A -Checking CT Scan of the Chest w Contrast given worsend Hypoxia -Was supposed to be on Augmentin x5 Days   -Check Respiratory Virus Panel and COVID  Acute Fall -Fell in the ED and hit his head.  He was at the side of the bed and the urinal was provided for the patient by the nurse.  Moments later after she left the room she heard a noise when she went into the room she found the patient on the floor and his IV had been pulled out. -Obtain Stat Head CT w/o Contrast -Will need Fall Precautions   Somnolence and Drowsiness -Check ABG  -Check Head CT -Check UDS -? Related to Opiates or Medications -Continue to Monitor Closely   Esophageal  Dysmotility/Stricture/Achalasia/Dysphagia  -GI consulted last admission and recommended no need for any urgent endoscopic therapy at the moment -Seen[facility] 10[facility]18/2020 by Dr. Karie Georges and Pearletha Forge and he was set up to proceed with a repeat EGD with intraprocedural FLIP and Botox injection.  12/09/20--[facility]--underwent EGD with  intraprocedural FLIP and Botox injection -Undergoing eval for POEM (per oral esophageal myotomy) at [facility] as an outpatient  -ContinueCimetidine 75 mg po Daily subsitution with Famotidine 20 mg po Daily -ContinueDiltiazem XR 120 mg po Daily   -C/w Pantoprazole 40 mg po BID  -Will need continued outpatient follow up with [facility]  Anxiety/Depression -Holding Alprazolam for now given Somnolence -PMP Aware reviewed--receives alprazolam1 mg#120 monthly  Hypothyroidism -Check TSH -Continue Levothyroxine 25 mcg po Daily qHS  Recent left calcaneus/talus fracture -Status post ORIF 07/04/2020--Dr. Susa Simmonds  OSA -Continue CPAP qhS  Normocytic Anemia -Patient's Hgb/Hct went from 11.4/36.9 -> 11.7/36.8 -Check Anemia Panel in the AM  -Continue to Monitor for S/Sx of Bleeding; No overt bleeding noted -Repeat CBC in the AM   Hyperglycemia -Likley Reactive -Check HbA1c -Continue to Monitor Blood Sugars per Protocol and if Necessary will place on Sensitive Novolog SSI AC  DVT prophylaxis: Enoxaparin 40 mg sq Code Status: FULL CODE Family Communication: No family present at bedside Disposition Plan: Pending further clinical Improvement  Consults called: None Admission status:  Inpatient Telemetry  Severity of Illness: The appropriate patient status for this patient is INPATIENT. Inpatient status is judged to be reasonable and necessary in order to provide the required intensity of service to ensure the patient's safety. The patient's presenting symptoms, physical exam findings, and initial radiographic and laboratory data in the context of their chronic  comorbidities is felt to place them at high risk for further clinical deterioration. Furthermore, it is not anticipated that the patient will be medically stable for discharge from the hospital within 2 midnights of admission. The following factors support the patient status of inpatient.   " The patient's presenting symptoms include SOB, Dyspnea. " The worrisome physical exam findings include Diminished breath Sounds. " The initial radiographic and laboratory data are worrisome because of worsened PNA. " The chronic co-morbidities are listed as above  * I certify that at the point of admission it is my clinical judgment that the patient will require inpatient hospital care spanning beyond 2 midnights from the point of admission due to high intensity of service, high risk for further deterioration and high frequency of surveillance required.Merlene Laughter, D.O. [facility] PAGER is on AMION  If 7PM-7AM, please contact night-coverage [email or web address]  12/23/2020, 4:03 PM

## 2020-12-23 NOTE — ED Triage Notes (Signed)
Pt brought in by EMS for shortness of breath. Pt has extensive history of aspiration pneumonia. Pt was brought to the hospital last week for pneumonia but left AMA. Pt treated for sepsis by EMS, IV established, 1 gram of rocephin given and 0.4 nitro given. Pt oxygen was 60% on arrival by EMS. Pt placed on CPAP, patient immediately reported that difficulty breathing subsided, oxygen at 100%. Pt put on 4L oxygen outside of hospital. Pt oxygen is 94% on 4 L oxygen at this time. Pt currently resting. Heart rate on arrival was in the 110-112 reported by ems. Pt heart rate currently 103 while resting.

## 2020-12-23 NOTE — ED Notes (Signed)
Pt put on 12 lead monitor 

## 2020-12-23 NOTE — ED Provider Notes (Signed)
[facility] EMERGENCY DEPARTMENT Provider Note   CSN: [id number] Arrival date & time: 12/23/20  0940     History Chief Complaint  Patient presents with  . Shortness of Breath    Christian Ellison is a 60 y.o. male.  Patient with hx esophageal dysmotility/aspiration, presents via EMS w report of recent diagnosis pneumonia, but signing out of hospital AMA, and increased sob in past few days. EMS notes initial o2 sats in 60s, improved on o2 - currently on 4 liters o2  with sats in mid 90s. Pt denies chest pain or discomfort. Has noted increased non prod cough. Denies known covid or flu exposure. Denies any recent or new aspiration or choking event. No headache. No chest pain or discomfort. No abd pain or vomiting. No dysuria or gu c/o. No leg pain or swelling. Denies orthopnea or pnd. Non smoker.   The history is provided by the patient and the EMS personnel. The history is limited by the condition of the patient.  Shortness of Breath Associated symptoms: cough   Associated symptoms: no abdominal pain, no chest pain, no fever, no headaches, no neck pain, no rash, no sore throat and no vomiting        Past Medical History:  Diagnosis Date  . Anxiety   . Aspiration pneumonia (HCC) 05/26/2018   RLL  . Chronic pain   . Closed fracture of xiphoid process   . Esophageal stricture   . GERD (gastroesophageal reflux disease)   . 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   Marland Kitchen Opiate abuse, continuous (HCC)   . Pulmonary edema 02/02/2018  . Sepsis due to pneumonia (HCC) 03/11/2019  . Sleep apnea    CPAP nightly    Patient Active Problem List   Diagnosis Date Noted  . Aspiration pneumonia (HCC) 12/20/2020  . Sepsis (HCC) 12/20/2020  . Recurrent aspiration pneumonia (HCC) 12/16/2020  . Acute respiratory failure with hypoxia (HCC) 11/05/2020  . Hypoxia 11/05/2020  . Pneumonia 10/09/2020  . GERD (gastroesophageal reflux disease)   . Esophageal motility disorder   . Alcohol  abuse, in remission 08/30/2020  . Gastroesophageal reflux disease with esophagitis 08/30/2020  . Moderate recurrent major depression (HCC) 08/30/2020  . Preoperative evaluation of a medical condition to rule out surgical contraindications (TAR required) 08/30/2020  . Anxiety 06/12/2020  . Benzodiazepine dependence (HCC) 06/12/2020  . History of substance abuse (HCC) 06/12/2020  . Obstructive sleep apnea 06/12/2020  . Hypothyroidism 03/11/2019  . Essential hypertension 02/02/2018  . CHF (congestive heart failure) (HCC) 02/02/2018  . Esophageal dysphagia 02/02/2018  . Esophageal stricture/stenosis/esophageal dysmotility with recurrent aspiration 02/02/2018  . Esophageal stenosis 02/02/2018  . History of hepatitis C 01/17/2013  . Depression 01/17/2013  . Anxiety with depression 01/17/2013    Past Surgical History:  Procedure Laterality Date  . BIOPSY  03/18/2018   Procedure: BIOPSY;  Surgeon: Malissa Hippo, MD;  Location: [facility];  Service: Endoscopy;;  gastric   . COLONOSCOPY N/A 11/22/2013   Dr. Karilyn Cota: Normal except for hemorrhoids.  Next colonoscopy 10 years.  . ESOPHAGEAL DILATION N/A 03/18/2018   Procedure: ESOPHAGEAL DILATION;  Surgeon: Malissa Hippo, MD;  Location: [facility];  Service: Endoscopy;  Laterality: N/A;  . ESOPHAGEAL DILATION N/A 08/07/2020   Procedure: ESOPHAGEAL DILATION;  Surgeon: Malissa Hippo, MD;  Location: [facility];  Service: Endoscopy;  Laterality: N/A;  . ESOPHAGEAL MANOMETRY N/A 06/15/2018   Procedure: ESOPHAGEAL MANOMETRY (EM);  Surgeon: Napoleon Form,  MD;  Location: [facility];  Service: Endoscopy;  Laterality: N/A;  . ESOPHAGOGASTRODUODENOSCOPY (EGD) WITH PROPOFOL N/A 03/18/2018   Dr. Karilyn Cota: Benign-appearing mild esophageal stenosis proximal to the GE J, status post dilation.  2 cm hiatal hernia.  Gastritis, biopsies negative for H. pylori  . ESOPHAGOGASTRODUODENOSCOPY (EGD) WITH PROPOFOL N/A 08/07/2020   Procedure:  ESOPHAGOGASTRODUODENOSCOPY (EGD) WITH PROPOFOL;  Surgeon: Malissa Hippo, MD;  Location: [facility];  Service: Endoscopy;  Laterality: N/A;  . Fracture Right Leg     Patient has rod/screws in this leg  . ORIF CALCANEOUS FRACTURE Left 07/04/2020   Procedure: OPEN REDUCTION INTERNAL FIXATION (ORIF) LEFT TALUS AND CALCANEOUS FRACTURE, SUBTALAR JOINT ARTHROTOMY WITH REMOVAL OF LOOSE BODIES, TALONAVICULAR JOINT ARTHOTOMY WITH REMOVAL OF LOOSE BODIES;  Surgeon: Terance Hart, MD;  Location: [facility] OR;  Service: Orthopedics;  Laterality: Left;  . Rotor Cuff  2012   Right Shoulder       Family History  Problem Relation Age of Onset  . Breast cancer Mother   . Kidney failure Mother   . Stroke Mother   . Colon cancer Father   . Bladder Cancer Father   . Prostate cancer Father   . Hypertension Sister   . Hypothyroidism Sister   . Healthy Sister   . Healthy Daughter   . Healthy Son   . Healthy Son     Social History   Tobacco Use  . Smoking status: Never Smoker  . Smokeless tobacco: Never Used  Vaping Use  . Vaping Use: Never used  Substance Use Topics  . Alcohol use: Not Currently  . Drug use: Not Currently    Types: Benzodiazepines    Comment: opiates    Home Medications Prior to Admission medications   Medication Sig Start Date End Date Taking? Authorizing Provider  acetaminophen (TYLENOL) 325 MG tablet Take 2 tablets (650 mg total) by mouth every 6 (six) hours as needed for mild pain (or Fever >/= 101). 05/17/19   Shon Hale, MD  albuterol (VENTOLIN HFA) 108 (90 Base) MCG/ACT inhaler Inhale 2 puffs into the lungs every 6 (six) hours as needed for wheezing or shortness of breath. 09/09/20   Anabel Halon, MD  ALPRAZolam Prudy Feeler) 1 MG tablet Take 2 mg by mouth at bedtime as needed for anxiety. 10/22/19   [provider]  amoxicillin-clavulanate (AUGMENTIN) 875-125 MG tablet Take 1 tablet by mouth every 12 (twelve) hours. 12/21/20   Catarina Hartshorn, MD  aspirin EC 81  MG tablet Take 1 tablet (81 mg total) by mouth daily with breakfast. Patient taking differently: Take 81 mg by mouth at bedtime. 03/13/19   Shon Hale, MD  Cimetidine (TAGAMET PO) Take 75 mg by mouth daily as needed (if he feels his food coming up after meal).    [provider]  diltiazem (DILACOR XR) 120 MG 24 hr capsule Take 1 capsule (120 mg total) by mouth daily. 08/17/20   Danford, Earl Lites, MD  Glucosamine HCl 1000 MG TABS Take 1,000 mg by mouth at bedtime.    [provider]  levothyroxine (SYNTHROID) 25 MCG tablet TAKE 1 TABLET BY MOUTH EVERY DAY 12/17/20   Anabel Halon, MD  Multiple Vitamin (MULTIVITAMIN WITH MINERALS) TABS tablet Take 1 tablet by mouth daily. 50+    [provider]  pantoprazole (PROTONIX) 40 MG tablet Take 1 tablet (40 mg total) by mouth 2 (two) times daily. 08/07/20 08/07/21  Shon Hale, MD    Allergies  Patient has no known allergies.  Review of Systems   Review of Systems  Constitutional: Negative for chills and fever.  HENT: Negative for sore throat.   Eyes: Negative for redness.  Respiratory: Positive for cough and shortness of breath.   Cardiovascular: Negative for chest pain, palpitations and leg swelling.  Gastrointestinal: Negative for abdominal pain and vomiting.  Genitourinary: Negative for dysuria and flank pain.  Musculoskeletal: Negative for back pain and neck pain.  Skin: Negative for rash.  Neurological: Negative for headaches.  Hematological: Does not bruise/bleed easily.  Psychiatric/Behavioral: Negative for agitation.    Physical Exam Updated Vital Signs BP 125/80 (BP Location: Right Arm)   Pulse (!) 103   Temp 97.9 F (36.6 C) (Oral)   Resp (!) 25   Ht 1.829 m (6')   Wt 97.5 kg   SpO2 94%   BMI 29.16 kg/m   Physical Exam Vitals and nursing note reviewed.  Constitutional:      Appearance: Normal appearance. He is well-developed.  HENT:     Head: Atraumatic.     Nose: Nose  normal.     Mouth/Throat:     Mouth: Mucous membranes are moist.     Pharynx: Oropharynx is clear.  Eyes:     General: No scleral icterus.    Conjunctiva/sclera: Conjunctivae normal.     Pupils: Pupils are equal, round, and reactive to light.  Neck:     Trachea: No tracheal deviation.  Cardiovascular:     Rate and Rhythm: Normal rate and regular rhythm.     Pulses: Normal pulses.     Heart sounds: Normal heart sounds. No murmur heard. No friction rub. No gallop.   Pulmonary:     Effort: Pulmonary effort is normal. No accessory muscle usage or respiratory distress.     Breath sounds: Rhonchi present.  Abdominal:     General: Bowel sounds are normal. There is no distension.     Palpations: Abdomen is soft.     Tenderness: There is no abdominal tenderness. There is no guarding.  Genitourinary:    Comments: No cva tenderness. Musculoskeletal:        General: No swelling or tenderness.     Cervical back: Normal range of motion and neck supple. No rigidity.     Right lower leg: No edema.     Left lower leg: No edema.  Skin:    General: Skin is warm and dry.     Findings: No rash.  Neurological:     Mental Status: He is alert.     Comments: Alert, speech clear.   Psychiatric:        Mood and Affect: Mood normal.     ED Results / Procedures / Treatments   Labs (all labs ordered are listed, but only abnormal results are displayed) Results for orders placed or performed during the hospital encounter of 12/23/20  CBC  Result Value Ref Range   WBC 6.0 4.0 - 10.5 K/uL   RBC 4.06 (L) 4.22 - 5.81 MIL/uL   Hemoglobin 11.7 (L) 13.0 - 17.0 g/dL   HCT 20.9 (L) 47.0 - 96.2 %   MCV 90.6 80.0 - 100.0 fL   MCH 28.8 26.0 - 34.0 pg   MCHC 31.8 30.0 - 36.0 g/dL   RDW 83.6 62.9 - 47.6 %   Platelets 188 150 - 400 K/uL   nRBC 0.0 0.0 - 0.2 %  Comprehensive metabolic panel  Result Value Ref Range   Sodium 138 135 -  145 mmol/L   Potassium 3.8 3.5 - 5.1 mmol/L   Chloride 99 98 - 111  mmol/L   CO2 32 22 - 32 mmol/L   Glucose, Bld 132 (H) 70 - 99 mg/dL   BUN 14 6 - 20 mg/dL   Creatinine, Ser 7.820.70 0.61 - 1.24 mg/dL   Calcium 8.2 (L) 8.9 - 10.3 mg/dL   Total Protein 7.2 6.5 - 8.1 g/dL   Albumin 3.3 (L) 3.5 - 5.0 g/dL   AST 10 (L) 15 - 41 U/L   ALT 11 0 - 44 U/L   Alkaline Phosphatase 55 38 - 126 U/L   Total Bilirubin 0.4 0.3 - 1.2 mg/dL   GFR, Estimated >95>60 >62>60 mL/min   Anion gap 7 5 - 15  Brain natriuretic peptide  Result Value Ref Range   B Natriuretic Peptide 19.0 0.0 - 100.0 pg/mL  Lactic acid, plasma  Result Value Ref Range   Lactic Acid, Venous 0.6 0.5 - 1.9 mmol/L  Troponin I (High Sensitivity)  Result Value Ref Range   Troponin I (High Sensitivity) 6 <18 ng/L   DG Chest Port 1 View  Result Date: 12/20/2020 CLINICAL DATA:  Shortness of breath EXAM: PORTABLE CHEST 1 VIEW COMPARISON:  11/05/2020, CT 11/05/2020 FINDINGS: Interval development of patchy airspace disease throughout the left greater than right thorax. No pleural effusion. Normal cardiomediastinal silhouette. No pneumothorax. IMPRESSION: Diffuse left greater than right patchy pulmonary opacity suspect for respiratory infection, potential atypical pneumonia. Electronically Signed   By: Jasmine PangKim  Fujinaga M.D.   On: 12/20/2020 00:00    EKG EKG Interpretation  Date/Time:  Monday Dec 23 2020 09:51:11 EDT Ventricular Rate:  100 PR Interval:  136 QRS Duration: 85 QT Interval:  341 QTC Calculation: 440 R Axis:   17 Text Interpretation: Sinus tachycardia Non-specific ST-t changes Confirmed by Cathren LaineSteinl, Cecillia Menees ([id number]) on 12/23/2020 10:26:56 AM   Radiology DG Chest Port 1 View  Result Date: 12/23/2020 CLINICAL DATA:  Pt brought in by EMS for shortness of breath. Pt has extensive history of aspiration pneumonia. Pt was brought to the hospital last week for pneumonia but left AMA. Pt treated for sepsis by EMS. Pt seemed very confused and asked where he was at but was able to state name and DOB. Hx of sepsis due  to pneumonia 03/11/2019, PE 02/02/2018, and aspiration pneumonia 05/26/2018. Covid test negative 12/20/2020. EXAM: PORTABLE CHEST 1 VIEW COMPARISON:  12/19/2020 and older exams. FINDINGS: Heart normal in size.  No mediastinal or hilar masses. Interstitial and small patchy areas of airspace opacity are noted in the left lung, minimally in the right mid to lower lung, similar to the prior chest radiograph. Are small areas of patchy opacity in the left upper lobe that appear new from the prior study. No pleural effusion or pneumothorax. Skeletal structures are grossly intact. IMPRESSION: 1. Interstitial and patchy airspace lung opacities, mostly on the left, with a small area of increased opacity in the left upper lung compared to the recent prior study. No other change from the 12/19/2020 exam. Findings consistent with multifocal, possibly atypical, pneumonia. No evidence of pulmonary edema. Electronically Signed   By: Amie Portlandavid  Ormond M.D.   On: 12/23/2020 11:01    Procedures Procedures   Medications Ordered in ED Medications  albuterol (VENTOLIN HFA) 108 (90 Base) MCG/ACT inhaler 4 puff (has no administration in time range)    ED Course  I have reviewed the triage vital signs and the nursing notes.  Pertinent labs & imaging results  that were available during my care of the patient were reviewed by me and considered in my medical decision making (see chart for details).    MDM Rules/Calculators/A&P                          Iv ns. Continuous pulse ox and cardiac monitoring. o2 [place]. Labs sent. Imaging ordered.  Reviewed nursing notes and prior charts for additional history.   Labs reviewed/interpreted by me - chem normal.   CXR reviewed/interpreted by me - bil infiltrates, increased.   Mild wheezing - albuterol treatment.   Given recent dx pna, leaving ama prior to completion tx, infiltrates on cxr - will tx for pna. Iv antibiotics given.   Given increased hypoxia, general weakness, increased  infiltrates - will admit.  Hospitalists consulted for admission.      Final Clinical Impression(s) / ED Diagnoses Final diagnoses:  None    Rx / DC Orders ED Discharge Orders    None       Cathren Laine, MD 12/23/20 1227

## 2020-12-24 ENCOUNTER — Encounter: Payer: Self-pay | Admitting: Internal Medicine

## 2020-12-24 ENCOUNTER — Ambulatory Visit (INDEPENDENT_AMBULATORY_CARE_PROVIDER_SITE_OTHER): Payer: 59 | Admitting: Internal Medicine

## 2020-12-24 ENCOUNTER — Inpatient Hospital Stay (HOSPITAL_COMMUNITY): Payer: 59

## 2020-12-24 LAB — CBC
HCT: 36.3 % — ABNORMAL LOW (ref 39.0–52.0)
Hemoglobin: 11.3 g/dL — ABNORMAL LOW (ref 13.0–17.0)
MCH: 29.2 pg (ref 26.0–34.0)
MCHC: 31.1 g/dL (ref 30.0–36.0)
MCV: 93.8 fL (ref 80.0–100.0)
Platelets: 175 10*3/uL (ref 150–400)
RBC: 3.87 MIL/uL — ABNORMAL LOW (ref 4.22–5.81)
RDW: 12.9 % (ref 11.5–15.5)
WBC: 6.4 10*3/uL (ref 4.0–10.5)
nRBC: 0 % (ref 0.0–0.2)

## 2020-12-24 LAB — COMPREHENSIVE METABOLIC PANEL
ALT: 10 U/L (ref 0–44)
AST: 10 U/L — ABNORMAL LOW (ref 15–41)
Albumin: 3.1 g/dL — ABNORMAL LOW (ref 3.5–5.0)
Alkaline Phosphatase: 54 U/L (ref 38–126)
Anion gap: 8 (ref 5–15)
BUN: 12 mg/dL (ref 6–20)
CO2: 29 mmol/L (ref 22–32)
Calcium: 8.3 mg/dL — ABNORMAL LOW (ref 8.9–10.3)
Chloride: 102 mmol/L (ref 98–111)
Creatinine, Ser: 0.64 mg/dL (ref 0.61–1.24)
GFR, Estimated: >60 mL/min (ref >60)
Glucose, Bld: 121 mg/dL — ABNORMAL HIGH (ref 70–99)
Potassium: 3.8 mmol/L (ref 3.5–5.1)
Sodium: 139 mmol/L (ref 135–145)
Total Bilirubin: 0.6 mg/dL (ref 0.3–1.2)
Total Protein: 6.8 g/dL (ref 6.5–8.1)

## 2020-12-24 LAB — CULTURE, BLOOD (ROUTINE X 2)
Culture: NO GROWTH
Culture: NO GROWTH
Special Requests: ADEQUATE
Special Requests: ADEQUATE

## 2020-12-24 MED ORDER — ALBUTEROL SULFATE (2.5 MG/3ML) 0.083% IN NEBU: 2.5000 mg | INHALATION_SOLUTION | RESPIRATORY_TRACT | Status: DC | PRN

## 2020-12-24 MED ORDER — IPRATROPIUM BROMIDE 0.02 % IN SOLN
0.5000 mg | Freq: Two times a day (BID) | RESPIRATORY_TRACT | Status: DC
  Administered 2020-12-24 – 2020-12-26 (×4): 0.5 mg via RESPIRATORY_TRACT
  Filled 2020-12-24 (×4): qty 2.5

## 2020-12-24 MED ORDER — LEVALBUTEROL HCL 0.63 MG/3ML IN NEBU
0.6300 mg | INHALATION_SOLUTION | Freq: Two times a day (BID) | RESPIRATORY_TRACT | Status: DC
  Administered 2020-12-24 – 2020-12-26 (×4): 0.63 mg via RESPIRATORY_TRACT
  Filled 2020-12-24 (×4): qty 3

## 2020-12-24 NOTE — Progress Notes (Signed)
SLP Cancellation Note  Patient Details Name: Christian Ellison MRN: [id number] DOB: 1960-09-03   Cancelled treatment:       Reason Eval/Treat Not Completed: Other (comment); Pt well known to SLP service from numerous hospitalizations and consults to speech, however no further SLP services warranted given that the nature of his dysphagia is due to severe esophageal dysphagia.   SLP completed MBSS here 11/05/20 with recommendations to follow up with GI. EGD with FLIP and Botox with Dr. Constance Goltz is planned, but has not yet been performed as far as I can tell from Care Everywhere. He also may have had esophageal manometry completed 11/29/20, but no results. He saw Jillyn Ledger, cardiothoracic surgery on 12/17/20 and it appears that the plan is for Pt to see Dr. Constance Goltz for EGD with FLIP and botox. Full liquids were previously recommended to facilitate esophageal clearance, however he still is at high risk for post prandial aspiration/ regurgitation and aspiration. SLP spoke with Pt and wife after MBSS and they had planned to follow up at [facility].   Most recent MBSS findings from 11/05/20 posted below. SLP will sign off.    <<Pt presents with primary esophageal dysphagia which negatively impacts pharyngeal phase. Pt with premature spillage of liquids to the pyriforms with swallow initiation at the level of the pyriforms, no penetration or aspiration. Pt with trace pyriform residuals post swallow with liquids and puree. Pt with adequate hyolaryngeal excursion and tongue base retraction. He required two swallows to move the barium tablet taken with cup sip thin out of the oral cavity. Esophageal sweep revealed standing column of barium with bird peak appearance near GEJ even after thin liquids and puree only. Retention of food/barium increased throughout the study with eventual backflow of barium into the cervical esophagus. Pt has a follow up appointment in April per his report with the GI team at [facility] for  consideration of possible Botox. Pt may be best to adhere to liquids only until he is seen. Pt is at risk for post prandial aspiration due to esophageal retention across textures and consistencies. Pt was encouraged to sit fully upright for all eating and drinking and remain upright for greater than 30 minutes post meals. He should also elevate the head of his bed (he only uses extra pillows at this time). SLP showed the imaging from the MBSS to Pt and reviewed esophageal swallow precautions. SLP will follow up x1 during acute stay to reinforce recommendations from today. MD, please see imaging attached in Image section.>>  Thank you,  Havery Moros, CCC-SLP [phone]    Christian Ellison 12/24/2020, 5:15 PM

## 2020-12-24 NOTE — Progress Notes (Signed)
PROGRESS NOTE    Curby Carswell Purtle  CLE:[id number] DOB: [birth year] DOA: 12/23/2020 PCP: Anabel Halon, MD   Brief Narrative:  Ayson Cherubini Cadieux is a 60 y.o. 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  Recently was admitted on 12/20/2020 for shortness of breath and aspiration pneumonia and subsequently signed out AGAINST MEDICAL ADVICE on 12/21/2020.  Patient went home and he started having increasing worsening shortness of breath.  EMS was called and upon arrival his O2 saturations were noted to be in 60s.  They placed patient on supplemental oxygen and he undergone 6 L with saturation in the mid 60s.  He denies chest pain or discomfort but has notices increasing productive cough.  Denied any flu or COVID exposures and denies any new or recent aspiration or choking meds and wants to eat a cheeseburger.  Denied headache or chest pain on evaluation but was extremely somnolent and drowsy at the time of admission.  Right prior to me evaluating patient patient fell in the ED hitting his head after he tried to use a urinal.  He was asked admit this patient for his worsening pneumonia and acute respiratory failure with hypoxia as well as severe sepsis from his multifocal pneumonia.  ED Course: In the ED patient had basic blood work done as well as an EKG and a chest x-ray.  He is given IV vancomycin and IV cefepime and given 2 breathing treatments and 1 remaining albuterol inhaler as well as albuterol neb and DuoNeb.   Of note coronavirus testing would not be done and is still pending and blood cultures are being obtained now.  **Interim History His O2 requirement is trending down slightly but he is still a little sleepy and somnolent. States he does not wear O2 at home and tells me "I don't plan on being here much longer."  Assessment & Plan:   Active Problems:   Multifocal pneumonia  Acute Respiratory Failure with Hypoxia in the setting of Multifocal PNA and ? Aspiration in the setting of Achalasia and Known Dysmotility  -Patient presents with SOB and a Room Air Saturation <90% (60%) -SpO2: 96 % O2 Flow Rate (L/min): 4 L/min; O2 requirment is weaning some  -ABG done and showed:    Component Value Date/Time   PHART 7.365 12/23/2020 1710   PCO2ART 55.3 (H) 12/23/2020 1710   PO2ART 75.2 (L) 12/23/2020 1710   HCO3 28.6 (H) 12/23/2020 1710   TCO2 32 05/26/2018 1214   O2SAT 94.4 12/23/2020 1710  -Check Respiratory Virus Panel and pending to be done  -Chest X-Ray done and showed "Interstitial and patchy airspace lung opacities, mostly on the left, with a  small area of increased opacity in the left upper lung compared to the recent prior study. No other change from the 12/19/2020 exam. Findings consistent with multifocal, possibly atypical, pneumonia. No evidence of pulmonary edema." -Check CT Chest w Contrast; Had a CTA in March; CT Chest showed "Multifocal patchy, nodular and ground-glass airspace opacities throughout all lobes of both lungs, greatest involving the left upper lobe on the current exam.  Findings most consistent with multifocal pneumonia. Aspiration is considered given esophageal findings, however distribution is atypical. Dilated esophagus containing intraluminal fluid and debris, with mild thickening distally. Similar findings to prior exam. 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 and Timed Barium Swallow -Aspiration Precautions -Given IV Vanc and Cefepime in the ED; Will change to IV Ceftriaxone, IV Azithromycin, and IV Flagyl for now  -Keep on FULL Liquid Diet as recommendations and obtain SLP evaluation again; Last Hospitalization was supposed to be on a FULL Liquid Diet with Softer Pudding Texture Meals/Ice Cream; SLP evaluation pending -Will give Flutter Valve and Incentive Spirometry; C/w Guaifenesin  -GI recommended management of his dysmotility at [facility] gastroenterology determine eligibility for POEM versus Heller myomotomy  Severe Sepsis 2/2 to Multifocal PNA and likely Aspiration PNA, poA -Presented with Tachycardia, and Tachypenia and Acute Respiratory Failure with Hypoxia in the setting of Multifocal PNA and likely from Aspiration; Liklely a continuation from last hospitalization  -Given 1 Liter Bolus in the ED  -Abx as above -Started Maintenance IVF with NS at 75 mL/hr and will continue  -Check Blood Cx x2 -Check U/A and was unremarkable  -CT Scan of the Chest w Contrast given worsend Hypoxia as above -Was supposed to be on Augmentin x5 Days; Abx as above -Check Respiratory Virus Panel; COVID Testing was Negative  Acute Genia Hotter in the ED and hit his head.  He was at the side of the bed and the urinal was provided for the patient by the nurse.  Moments later after she left the room she heard a noise when she went into the room she found the patient on the floor and his IV had been pulled out. -Obtained Head CT w/o Contrast and showed "No acute intracranial abnormality. No skull fracture." -Will need Fall Precautions   -Will get PT/OT   Somnolence and Drowsiness -Checked ABG and he was Hypercarbic and a Little Hypoxemic; Maintaining Saturations well and Compensating  -Check Head CT and normal -Check UDS and was POSITIVE For Benzodiazepines and THC -? Related to Opiates or Medications -Continue to Monitor Closely and he is a little more awake today   Esophageal Dysmotility/Stricture/Achalasia/Dysphagia  -GI consulted last admission and recommended no need for any urgent endoscopic therapy at the moment -Seenat [facility] 10/18/2020 by Dr. Karie Georges and Pearletha Forge and he was set up to proceed with a repeat EGD with intraprocedural FLIP and Botox injection. 12/09/20--[facility]--underwent EGD withintraprocedural FLIP and Botox injection -Undergoing eval for POEM (per oral esophageal myotomy) at [facility] as an outpatient  -ContinueCimetidine 75 mg po Daily subsitution with Famotidine 20 mg po Daily -ContinueDiltiazem XR 120 mg po Daily  -C/w Pantoprazole 40 mg po BID  -Will need continued outpatient follow up with [facility]  Anxiety/Depression -Holding Alprazolam for now given Somnolence -Receives Alprazolam Monthly   Hypothyroidism -Checked TSH and was 1.609 -Continue Levothyroxine 25 mcg po Daily qHS  Hx of Left calcaneus/talus fracture -Status post ORIF 07/04/2020--Dr. Susa Simmonds -C/w PT/OT to evaluate and Treat  OSA -Continue CPAP qHS  Normocytic Anemia -Patient's Hgb/Hct went from 11.4/36.9 -> 11.7/36.8 -> 11.3/36.3 -  Check Anemia Panel in the AM  -Continue to Monitor for S/Sx of Bleeding; No overt bleeding noted -Repeat CBC in the AM   Hyperglycemia -Likley Reactive -Check HbA1c -Continue to Monitor Blood Sugars per Protocol and if Necessary will place on Sensitive Novolog SSI AC  DVT prophylaxis: Enoxaparin 40 mg sq q24h Code Status: FULL CODE  Family Communication: No family present at bedside Disposition Plan: Pending further clinical improvement  Status is: Inpatient  Remains  inpatient appropriate because:Unsafe d/c plan, IV treatments appropriate due to intensity of illness or inability to take PO and Inpatient level of care appropriate due to severity of illness   Dispo: The patient is from: Home              Anticipated d/c is to: Home              Patient currently is not medically stable to d/c.   Difficult to place patient No  Consultants:   None   Procedures: None  Antimicrobials:  Anti-infectives (From admission, onward)   Start     Dose/Rate Route Frequency Ordered Stop   12/23/20 2100  vancomycin (VANCOCIN) IVPB 1000 mg/200 mL premix  Status:  Discontinued        1,000 mg 200 mL/hr over 60 Minutes Intravenous Every 8 hours 12/23/20 1145 12/23/20 1745   12/23/20 2000  ceFEPIme (MAXIPIME) 2 g in sodium chloride 0.9 % 100 mL IVPB  Status:  Discontinued        2 g 200 mL/hr over 30 Minutes Intravenous Every 8 hours 12/23/20 1144 12/23/20 1745   12/23/20 2000  cefTRIAXone (ROCEPHIN) 2 g in sodium chloride 0.9 % 100 mL IVPB        2 g 200 mL/hr over 30 Minutes Intravenous Every 24 hours 12/23/20 1734 12/28/20 1959   12/23/20 1830  azithromycin (ZITHROMAX) 500 mg in sodium chloride 0.9 % 250 mL IVPB        500 mg 250 mL/hr over 60 Minutes Intravenous Every 24 hours 12/23/20 1734 12/28/20 1829   12/23/20 1545  metroNIDAZOLE (FLAGYL) IVPB 500 mg        500 mg 100 mL/hr over 60 Minutes Intravenous Every 8 hours 12/23/20 1541     12/23/20 1200  vancomycin (VANCOREADY) IVPB 2000 mg/400 mL        2,000 mg 200 mL/hr over 120 Minutes Intravenous  Once 12/23/20 1144 12/23/20 1453   12/23/20 1145  ceFEPIme (MAXIPIME) 2 g in sodium chloride 0.9 % 100 mL IVPB        2 g 200 mL/hr over 30 Minutes Intravenous  Once 12/23/20 1133 12/23/20 1247        Subjective: Seen and examined at bedside and he was doing okay.  Respiratory status is improved minimally.  Denies any lightheadedness or dizziness.  Did not wear oxygen at home and still requiring 4 L.   Recommending to ambulate the patient.  He denies any chest pain.  No other concerns at this time.  Objective: Vitals:   12/23/20 2347 12/24/20 0512 12/24/20 0519 12/24/20 0746  BP:  120/70    Pulse:  85    Resp: 16 16    Temp:  (!) 97.5 F (36.4 C)    TempSrc:      SpO2: 95% (!) 77% 95% 96%  Weight:      Height:        Intake/Output Summary (Last 24 hours) at 12/24/2020 1323 Last data filed at 12/24/2020 0900 Gross per 24  hour  Intake 2059.34 ml  Output --  Net 2059.34 ml   Filed Weights   12/23/20 0947 12/23/20 1800  Weight: 97.5 kg 88.8 kg   Examination: Physical Exam:  Constitutional: WN/WD overweight chronically ill-appearing middle-aged Caucasian male currently in no acute distress but appears sleepy again Eyes: Lids and conjunctivae normal, sclerae anicteric  ENMT: External Ears, Nose appear normal. Grossly normal hearing. Neck: Appears normal, supple, no cervical masses, normal ROM, no appreciable thyromegaly; no JVD Respiratory: Diminished to auscultation bilaterally with coarse breath sounds and some rhonchi.  Minimal crackles noted.  No appreciable wheezing.  Has a mildly increased respiratory effort and is wearing 4 L supplemental oxygen nasal cannula today.   Cardiovascular: RRR, no murmurs / rubs / gallops. S1 and S2 auscultated. Minimal Edema Abdomen: Soft, non-tender, Distended 2/2 body habitus. No masses palpated. No appreciable hepatosplenomegaly. Bowel sounds positive.  GU: Deferred. Musculoskeletal: No clubbing / cyanosis of digits/nails. No joint deformity upper and lower extremities. Skin: No rashes, lesions, ulcers on a limited skin evaluation. No induration; Warm and dry.  Neurologic: CN 2-12 grossly intact with no focal deficits. Romberg sign and cerebellar reflexes not assessed.  Psychiatric: Normal judgment and insight.  He appears a little sleepy and drowsy but easily arousable. Normal mood and appropriate affect.   Data Reviewed: I have personally  reviewed following labs and imaging studies  CBC: Recent Labs  Lab 12/19/20 2356 12/20/20 0521 12/21/20 0534 12/23/20 1109 12/24/20 0650  WBC 6.9 7.3 7.4 6.0 6.4  NEUTROABS 5.9 5.8  --   --   --   HGB 14.3 11.5* 11.4* 11.7* 11.3*  HCT 43.2 36.6* 36.9* 36.8* 36.3*  MCV 90.0 93.1 92.0 90.6 93.8  PLT 189 165 159 188 175   Basic Metabolic Panel: Recent Labs  Lab 12/19/20 2356 12/20/20 0521 12/21/20 0534 12/23/20 1109 12/24/20 0650  NA 138 139 137 138 139  K 3.9 3.9 3.4* 3.8 3.8  CL 100 106 98 99 102  CO2 29 28 31  32 29  GLUCOSE 122* 113* 108* 132* 121*  BUN 13 10 10 14 12   CREATININE 0.85 0.69 0.75 0.70 0.64  CALCIUM 8.7* 7.8* 8.6* 8.2* 8.3*  MG 2.0  --   --   --   --   PHOS 1.7*  --   --   --   --    GFR: Estimated Creatinine Clearance: 109.1 mL/min (by C-G formula based on SCr of 0.64 mg/dL). Liver Function Tests: Recent Labs  Lab 12/19/20 2356 12/20/20 0521 12/21/20 0534 12/23/20 1109 12/24/20 0650  AST 20 13* 13* 10* 10*  ALT 18 12 13 11 10   ALKPHOS 68 53 56 55 54  BILITOT 0.5 0.6 1.2 0.4 0.6  PROT 7.9 6.1* 6.8 7.2 6.8  ALBUMIN 4.1 3.2* 3.3* 3.3* 3.1*   No results for input(s): LIPASE, AMYLASE in the last 168 hours. No results for input(s): AMMONIA in the last 168 hours. Coagulation Profile: Recent Labs  Lab 12/19/20 2356 12/21/20 0534  INR 0.9 1.1   Cardiac Enzymes: No results for input(s): CKTOTAL, CKMB, CKMBINDEX, TROPONINI in the last 168 hours. BNP (last 3 results) No results for input(s): PROBNP in the last 8760 hours. HbA1C: No results for input(s): HGBA1C in the last 72 hours. CBG: No results for input(s): GLUCAP in the last 168 hours. Lipid Profile: No results for input(s): CHOL, HDL, LDLCALC, TRIG, CHOLHDL, LDLDIRECT in the last 72 hours. Thyroid Function Tests: Recent Labs    12/23/20 1323  TSH  1.609   Anemia Panel: No results for input(s): VITAMINB12, FOLATE, FERRITIN, TIBC, IRON, RETICCTPCT in the last 72 hours. Sepsis  Labs: Recent Labs  Lab 12/19/20 2356 12/20/20 0145 12/20/20 0521 12/20/20 0800 12/23/20 1109  PROCALCITON 0.15  --   --   --   --   LATICACIDVEN 3.0* 2.1* 1.3 1.1 0.6    Recent Results (from the past 240 hour(s))  Blood Culture (routine x 2)     Status: None   Collection Time: 12/19/20 11:56 PM   Specimen: BLOOD  Result Value Ref Range Status   Specimen Description BLOOD LEFT ANTECUBITAL  Final   Special Requests AEROBIC BOTTLE ONLY Blood Culture adequate volume  Final   Culture   Final    NO GROWTH 5 DAYS Performed at [facility], [address], [place], [place] [address]    Report Status 12/24/2020 FINAL  Final  Blood Culture (routine x 2)     Status: None   Collection Time: 12/19/20 11:56 PM   Specimen: BLOOD  Result Value Ref Range Status   Specimen Description BLOOD RIGHT ANTECUBITAL  Final   Special Requests AEROBIC BOTTLE ONLY Blood Culture adequate volume  Final   Culture   Final    NO GROWTH 5 DAYS Performed at [facility], [address] [place], [place] [address]    Report Status 12/24/2020 FINAL  Final  Resp Panel by RT-PCR (Flu A&B, Covid) Nasopharyngeal Swab     Status: None   Collection Time: 12/20/20 12:28 AM   Specimen: Nasopharyngeal Swab; Nasopharyngeal(NP) swabs in vial transport medium  Result Value Ref Range Status   SARS Coronavirus 2 by RT PCR NEGATIVE NEGATIVE Final    Comment: (NOTE) SARS-CoV-2 target nucleic acids are NOT DETECTED.  The SARS-CoV-2 RNA is generally detectable in upper respiratory specimens during the acute phase of infection. The lowest concentration of SARS-CoV-2 viral copies this assay can detect is 138 copies/mL. A negative result does not preclude SARS-Cov-2 infection and should not be used as the sole basis for treatment or other patient management decisions. A negative result may occur with  improper specimen collection/handling, submission of specimen other than nasopharyngeal swab, presence of viral mutation(s)  within the areas targeted by this assay, and inadequate number of viral copies(<138 copies/mL). A negative result must be combined with clinical observations, patient history, and epidemiological information. The expected result is Negative.  Fact Sheet for Patients:  BloggerCourse.com  Fact Sheet for Healthcare Providers:  SeriousBroker.it  This test is no t yet approved or cleared by the [place] FDA and  has been authorized for detection and/or diagnosis of SARS-CoV-2 by FDA under an Emergency Use Authorization (EUA). This EUA will remain  in effect (meaning this test can be used) for the duration of the COVID-19 declaration under Section 564(b)(1) of the Act, 21 U.S.C.section 360bbb-3(b)(1), unless the authorization is terminated  or revoked sooner.       Influenza A by PCR NEGATIVE NEGATIVE Final   Influenza B by PCR NEGATIVE NEGATIVE Final    Comment: (NOTE) The Xpert Xpress SARS-CoV-2/FLU/RSV plus assay is intended as an aid in the diagnosis of influenza from Nasopharyngeal swab specimens and should not be used as a sole basis for treatment. Nasal washings and aspirates are unacceptable for Xpert Xpress SARS-CoV-2/FLU/RSV testing.  Fact Sheet for Patients: BloggerCourse.com  Fact Sheet for Healthcare Providers: SeriousBroker.it  This test is not yet approved or cleared by the [place] FDA and has been authorized for detection and/or  diagnosis of SARS-CoV-2 by FDA under an Emergency Use Authorization (EUA). This EUA will remain in effect (meaning this test can be used) for the duration of the COVID-19 declaration under Section 564(b)(1) of the Act, 21 U.S.C. section 360bbb-3(b)(1), unless the authorization is terminated or revoked.  Performed at [facility], [address], [place], [place] [address]   Urine culture     Status: None   Collection Time:  12/20/20  4:51 AM   Specimen: In/Out Cath Urine  Result Value Ref Range Status   Specimen Description   Final    IN/OUT CATH URINE Performed at [facility], [address] [place], [place] [address]    Special Requests   Final    NONE Performed at [facility], [address] [place], [place] [address]    Culture   Final    NO GROWTH Performed at [facility], [address], [place], [place] [address]    Report Status 12/21/2020 FINAL  Final  MRSA PCR Screening     Status: None   Collection Time: 12/20/20  8:20 AM   Specimen: Nasopharyngeal  Result Value Ref Range Status   MRSA by PCR NEGATIVE NEGATIVE Final    Comment:        The GeneXpert MRSA Assay (FDA approved for NASAL specimens only), is one component of a comprehensive MRSA colonization surveillance program. It is not intended to diagnose MRSA infection nor to guide or monitor treatment for MRSA infections. Performed at [facility], [address], [place], [place] [address]   Resp Panel by RT-PCR (Flu A&B, Covid) Nasopharyngeal Swab     Status: None   Collection Time: 12/23/20  3:15 PM   Specimen: Nasopharyngeal Swab; Nasopharyngeal(NP) swabs in vial transport medium  Result Value Ref Range Status   SARS Coronavirus 2 by RT PCR NEGATIVE NEGATIVE Final    Comment: (NOTE) SARS-CoV-2 target nucleic acids are NOT DETECTED.  The SARS-CoV-2 RNA is generally detectable in upper respiratory specimens during the acute phase of infection. The lowest concentration of SARS-CoV-2 viral copies this assay can detect is 138 copies/mL. A negative result does not preclude SARS-Cov-2 infection and should not be used as the sole basis for treatment or other patient management decisions. A negative result may occur with  improper specimen collection/handling, submission of specimen other than nasopharyngeal swab, presence of viral mutation(s) within the areas targeted by this assay, and inadequate number of  viral copies(<138 copies/mL). A negative result must be combined with clinical observations, patient history, and epidemiological information. The expected result is Negative.  Fact Sheet for Patients:  BloggerCourse.com  Fact Sheet for Healthcare Providers:  SeriousBroker.it  This test is no t yet approved or cleared by the [place] FDA and  has been authorized for detection and/or diagnosis of SARS-CoV-2 by FDA under an Emergency Use Authorization (EUA). This EUA will remain  in effect (meaning this test can be used) for the duration of the COVID-19 declaration under Section 564(b)(1) of the Act, 21 U.S.C.section 360bbb-3(b)(1), unless the authorization is terminated  or revoked sooner.       Influenza A by PCR NEGATIVE NEGATIVE Final   Influenza B by PCR NEGATIVE NEGATIVE Final    Comment: (NOTE) The Xpert Xpress SARS-CoV-2/FLU/RSV plus assay is intended as an aid in the diagnosis of influenza from Nasopharyngeal swab specimens and should not be used as a sole basis for treatment. Nasal washings and aspirates are unacceptable for Xpert Xpress SARS-CoV-2/FLU/RSV testing.  Fact Sheet  for Patients: BloggerCourse.com  Fact Sheet for Healthcare Providers: SeriousBroker.it  This test is not yet approved or cleared by the [place] FDA and has been authorized for detection and/or diagnosis of SARS-CoV-2 by FDA under an Emergency Use Authorization (EUA). This EUA will remain in effect (meaning this test can be used) for the duration of the COVID-19 declaration under Section 564(b)(1) of the Act, 21 U.S.C. section 360bbb-3(b)(1), unless the authorization is terminated or revoked.  Performed at [facility], [address] [place], [place] [address]   Blood culture (routine x 2)     Status: None (Preliminary result)   Collection Time: 12/23/20  4:01 PM   Specimen: BLOOD   Result Value Ref Range Status   Specimen Description BLOOD RIGHT ANTECUBITAL  Final   Special Requests   Final    BOTTLES DRAWN AEROBIC AND ANAEROBIC Blood Culture adequate volume   Culture   Final    NO GROWTH < 24 HOURS Performed at [facility], [address], [place], [place] [address]    Report Status PENDING  Incomplete  Blood culture (routine x 2)     Status: None (Preliminary result)   Collection Time: 12/23/20  4:01 PM   Specimen: BLOOD  Result Value Ref Range Status   Specimen Description BLOOD LEFT ANTECUBITAL  Final   Special Requests   Final    BOTTLES DRAWN AEROBIC AND ANAEROBIC Blood Culture adequate volume   Culture   Final    NO GROWTH < 24 HOURS Performed at [facility], [address], [place], [place] [address]    Report Status PENDING  Incomplete     RN Pressure Injury Documentation:     Estimated body mass index is 26.55 kg/m as calculated from the following:   Height as of this encounter: 6' (1.829 m).   Weight as of this encounter: 88.8 kg.  Malnutrition Type:   Malnutrition Characteristics:   Nutrition Interventions:    Radiology Studies: CT HEAD WO CONTRAST  Result Date: 12/23/2020 CLINICAL DATA:  Dizziness and fall today. EXAM: CT HEAD WITHOUT CONTRAST TECHNIQUE: Contiguous axial images were obtained from the base of the skull through the vertex without intravenous contrast. COMPARISON:  Head CT 02/02/2018 FINDINGS: Brain: No evidence of acute infarction, hemorrhage, hydrocephalus, extra-axial collection or mass lesion/mass effect. Slight asymmetry of the lateral ventricles with right larger than left is unchanged from prior exam, and of doubtful clinical significance. Vascular: No hyperdense vessel. Skull: No fracture or focal lesion. Sinuses/Orbits: No evidence of acute facial bone fracture of the visualized facial bones. There is rightward nasal septal deviation. Orbits are unremarkable. Mastoid air cells are clear. Other: None. IMPRESSION: No  acute intracranial abnormality. No skull fracture. Electronically Signed   By: Narda Rutherford M.D.   On: 12/23/2020 17:24   CT CHEST W CONTRAST  Result Date: 12/23/2020 CLINICAL DATA:  Worsening shortness of breath and hypoxia. History of recurrent aspiration pneumonia and pulmonary edema. EXAM: CT CHEST WITH CONTRAST TECHNIQUE: Multidetector CT imaging of the chest was performed during intravenous contrast administration. CONTRAST:  7mL OMNIPAQUE IOHEXOL 300 MG/ML  SOLN COMPARISON:  Most recent radiograph earlier today. Most recent CT 11/05/2020 FINDINGS: Cardiovascular: Mild aortic atherosclerosis without aneurysm. Exam not tailored for pulmonary artery evaluation, no significant pulmonary embolus. Heart size normal. No pericardial effusion. Mediastinum/Nodes: There is wall thickening of the distal esophagus. The esophagus is dilated and contains intraluminal fluid/debris. Scattered small mediastinal lymph nodes not enlarged by size criteria. 13 mm right hilar node, previously  15 mm. No thyroid nodule. Lungs/Pleura: Multifocal patchy, nodular and ground-glass airspace opacities. Findings are present in all lobes of both lungs, greatest involving the left upper lobe on the current exam. Occasional a segmental bronchial thickening in the left lower lobe. Trachea and central bronchi are otherwise patent. No pleural effusion. No septal thickening. Upper Abdomen: No acute upper abdominal findings. Musculoskeletal: There are no acute or suspicious osseous abnormalities. Mild degenerative change of the right glenohumeral joint. Scattered thoracic spondylosis. IMPRESSION: 1. Multifocal patchy, nodular and ground-glass airspace opacities throughout all lobes of both lungs, greatest involving the left upper lobe on the current exam. Findings most consistent with multifocal pneumonia. Aspiration is considered given esophageal findings, however distribution is atypical. 2. Dilated esophagus containing intraluminal fluid  and debris, with mild thickening distally. Similar findings to prior exam. Aortic Atherosclerosis (ICD10-I70.0). Electronically Signed   By: Narda Rutherford M.D.   On: 12/23/2020 21:20   Portable chest 1 View  Result Date: 12/24/2020 CLINICAL DATA:  Aspiration EXAM: PORTABLE CHEST 1 VIEW COMPARISON:  Dec 23, 2020 chest radiograph and chest CT FINDINGS: Multifocal airspace opacity, considerably more severe on the left than on the right, is stable. No well-defined consolidation. The heart is upper normal in size with pulmonary vascularity normal. No adenopathy. No bone lesions. IMPRESSION: Multifocal airspace opacity, more on the right than the left, persists without change. No new opacity evident. Stable cardiac silhouette. Electronically Signed   By: Bretta Bang III M.D.   On: 12/24/2020 08:17   DG Chest Port 1 View  Result Date: 12/23/2020 CLINICAL DATA:  Pt brought in by EMS for shortness of breath. Pt has extensive history of aspiration pneumonia. Pt was brought to the hospital last week for pneumonia but left AMA. Pt treated for sepsis by EMS. Pt seemed very confused and asked where he was at but was able to state name and DOB. Hx of sepsis due to pneumonia 03/11/2019, PE 02/02/2018, and aspiration pneumonia 05/26/2018. Covid test negative 12/20/2020. EXAM: PORTABLE CHEST 1 VIEW COMPARISON:  12/19/2020 and older exams. FINDINGS: Heart normal in size.  No mediastinal or hilar masses. Interstitial and small patchy areas of airspace opacity are noted in the left lung, minimally in the right mid to lower lung, similar to the prior chest radiograph. Are small areas of patchy opacity in the left upper lobe that appear new from the prior study. No pleural effusion or pneumothorax. Skeletal structures are grossly intact. IMPRESSION: 1. Interstitial and patchy airspace lung opacities, mostly on the left, with a small area of increased opacity in the left upper lung compared to the recent prior study. No other  change from the 12/19/2020 exam. Findings consistent with multifocal, possibly atypical, pneumonia. No evidence of pulmonary edema. Electronically Signed   By: Amie Portland M.D.   On: 12/23/2020 11:01   Scheduled Meds: . aspirin EC  81 mg Oral QHS  . diltiazem  120 mg Oral Daily  . enoxaparin (LOVENOX) injection  40 mg Subcutaneous Q24H  . guaiFENesin  600 mg Oral BID  . ipratropium  0.5 mg Nebulization BID  . levalbuterol  0.63 mg Nebulization BID  . levothyroxine  25 mcg Oral Q0600  . multivitamin with minerals  1 tablet Oral Daily  . pantoprazole  40 mg Oral BID  . senna  1 tablet Oral BID   Continuous Infusions: . 0.9 % NaCl with KCl 20 mEq / L 75 mL/hr at 12/23/20 1954  . azithromycin 250 mL/hr at 12/23/20 1859  .  cefTRIAXone (ROCEPHIN)  IV 2 g (12/23/20 2130)  . metronidazole 500 mg (12/24/20 0952)    LOS: 1 day   Merlene Laughter, DO [facility] PAGER is on AMION  If 7PM-7AM, please contact night-coverage [email or web address]

## 2020-12-24 NOTE — Progress Notes (Addendum)
Patient has a bag at bedside with what looks like a burger and french fries. I told him he is on a full liquid diet and that's what he could have and he respond with "get the hell out of my room" so I left the room.

## 2020-12-24 NOTE — Progress Notes (Signed)
Patient disconnects IV fluids on his own, and said "I ain't going to stop" so I Disconnected fluids and will only give IV antibiotics. Also tried to give morning medications and he would not take them in front of me, just told me "leave them on the table I'll take them."

## 2020-12-24 NOTE — Progress Notes (Signed)
Patient removed IV, refusing another IV currently. Spilling his supper all over himself telling the tech "get me a fucking gown." Patient has been extremely rude and uncooperative today.

## 2020-12-25 MED ORDER — ALPRAZOLAM 1 MG PO TABS
1.0000 mg | ORAL_TABLET | Freq: Three times a day (TID) | ORAL | Status: DC | PRN
  Administered 2020-12-25: 1 mg via ORAL
  Filled 2020-12-25: qty 1

## 2020-12-25 NOTE — Progress Notes (Signed)
Pt had o2 off when I came in room spo2 84% on room air o2 back on at 6lpm cann after treatment

## 2020-12-25 NOTE — Progress Notes (Signed)
PROGRESS NOTE   Christian Ellison  GQB:[id number] DOB: 1961/01/11 DOA: 12/23/2020 PCP: Anabel Halon, MD   Chief Complaint  Patient presents with  . Shortness of Breath   Level of care: Telemetry  Brief Admission History:  60 year old male admitted with acute hypoxic respiratory failure secondary to multifocal pneumonia after having left the hospital AMA earlier in the month.  Assessment & Plan:   Active Problems:   Multifocal pneumonia   Severe sepsis-secondary to pneumonia- treating supportively and symptomatically the sepsis physiology has resolved and responding to therapy.  Multifocal pneumonia-symptomatically improving with current antibiotic therapy which will be continued.  Continue supportive measures.  Esophageal dysmotility was to follow-up with [facility], currently being evaluated for POEM outpatient therapy  Anxiety and depression - temporarily held alprazolam due to somnolence  Hypothyroidism - resumed home levothyroxine  OSA - nightly CPAP ordered.   Normocytic anemia - stable.    DVT prophylaxis: enoxaparin Code Status: full  Family Communication: bedside updated Disposition:  Status is: Inpatient  Remains inpatient appropriate because:IV treatments appropriate due to intensity of illness or inability to take PO and Inpatient level of care appropriate due to severity of illness   Dispo: The patient is from: Home              Anticipated d/c is to: Home              Patient currently is not medically stable to d/c.   Difficult to place patient No  Procedures:     Antimicrobials:  Azithromycin 5/10>> Ceftriaxone 5/10>> Metronidazole 5/11>>  Subjective: C/o cough and chest congestion.   Objective: Vitals:   12/24/20 2008 12/25/20 0543 12/25/20 0554 12/25/20 0700  BP: 119/70 119/67  (!) 143/77  Pulse: 76 78  92  Resp: 19 19  16   Temp: 98.1 F (36.7 C) 98.2 F (36.8 C)  98.4 F (36.9 C)  TempSrc:    Oral  SpO2: 95% (!) 83% 91% 91%   Weight:      Height:        Intake/Output Summary (Last 24 hours) at 12/25/2020 1315 Last data filed at 12/25/2020 0900 Gross per 24 hour  Intake 480 ml  Output --  Net 480 ml   Filed Weights   12/23/20 0947 12/23/20 1800  Weight: 97.5 kg 88.8 kg   Examination:  General exam: Appears calm and comfortable  Respiratory system: ant upper airway congestion noise. Respiratory effort normal. Cardiovascular system: normal S1 & S2 heard. No JVD, murmurs, rubs, gallops or clicks. No pedal edema. Gastrointestinal system: Abdomen is nondistended, soft and nontender. No organomegaly or masses felt. Normal bowel sounds heard. Central nervous system: Alert and oriented. No focal neurological deficits. Extremities: Symmetric 5 x 5 power. Skin: No rashes, lesions or ulcers.  Psychiatry: Judgement and insight appear poor. Mood & affect flat.   Data Reviewed: I have personally reviewed following labs and imaging studies  CBC: Recent Labs  Lab 12/19/20 2356 12/20/20 0521 12/21/20 0534 12/23/20 1109 12/24/20 0650  WBC 6.9 7.3 7.4 6.0 6.4  NEUTROABS 5.9 5.8  --   --   --   HGB 14.3 11.5* 11.4* 11.7* 11.3*  HCT 43.2 36.6* 36.9* 36.8* 36.3*  MCV 90.0 93.1 92.0 90.6 93.8  PLT 189 165 159 188 175    Basic Metabolic Panel: Recent Labs  Lab 12/19/20 2356 12/20/20 0521 12/21/20 0534 12/23/20 1109 12/24/20 0650  NA 138 139 137 138 139  K 3.9 3.9 3.4* 3.8 3.8  CL 100 106 98 99 102  CO2 29 28 31  32 29  GLUCOSE 122* 113* 108* 132* 121*  BUN 13 10 10 14 12   CREATININE 0.85 0.69 0.75 0.70 0.64  CALCIUM 8.7* 7.8* 8.6* 8.2* 8.3*  MG 2.0  --   --   --   --   PHOS 1.7*  --   --   --   --     GFR: Estimated Creatinine Clearance: 109.1 mL/min (by C-G formula based on SCr of 0.64 mg/dL).  Liver Function Tests: Recent Labs  Lab 12/19/20 2356 12/20/20 0521 12/21/20 0534 12/23/20 1109 12/24/20 0650  AST 20 13* 13* 10* 10*  ALT 18 12 13 11 10   ALKPHOS 68 53 56 55 54  BILITOT 0.5 0.6  1.2 0.4 0.6  PROT 7.9 6.1* 6.8 7.2 6.8  ALBUMIN 4.1 3.2* 3.3* 3.3* 3.1*    CBG: No results for input(s): GLUCAP in the last 168 hours.  Recent Results (from the past 240 hour(s))  Blood Culture (routine x 2)     Status: None   Collection Time: 12/19/20 11:56 PM   Specimen: BLOOD  Result Value Ref Range Status   Specimen Description BLOOD LEFT ANTECUBITAL  Final   Special Requests AEROBIC BOTTLE ONLY Blood Culture adequate volume  Final   Culture   Final    NO GROWTH 5 DAYS Performed at [facility], [address] [place], [facility] [address]    Report Status 12/24/2020 FINAL  Final  Blood Culture (routine x 2)     Status: None   Collection Time: 12/19/20 11:56 PM   Specimen: BLOOD  Result Value Ref Range Status   Specimen Description BLOOD RIGHT ANTECUBITAL  Final   Special Requests AEROBIC BOTTLE ONLY Blood Culture adequate volume  Final   Culture   Final    NO GROWTH 5 DAYS Performed at [facility], [address], [place], [facility] [address]    Report Status 12/24/2020 FINAL  Final  Resp Panel by RT-PCR (Flu A&B, Covid) Nasopharyngeal Swab     Status: None   Collection Time: 12/20/20 12:28 AM   Specimen: Nasopharyngeal Swab; Nasopharyngeal(NP) swabs in vial transport medium  Result Value Ref Range Status   SARS Coronavirus 2 by RT PCR NEGATIVE NEGATIVE Final    Comment: (NOTE) SARS-CoV-2 target nucleic acids are NOT DETECTED.  The SARS-CoV-2 RNA is generally detectable in upper respiratory specimens during the acute phase of infection. The lowest concentration of SARS-CoV-2 viral copies this assay can detect is 138 copies/mL. A negative result does not preclude SARS-Cov-2 infection and should not be used as the sole basis for treatment or other patient management decisions. A negative result may occur with  improper specimen collection/handling, submission of specimen other than nasopharyngeal swab, presence of viral mutation(s) within the areas targeted by this assay,  and inadequate number of viral copies(<138 copies/mL). A negative result must be combined with clinical observations, patient history, and epidemiological information. The expected result is Negative.  Fact Sheet for Patients:  85462  Fact Sheet for Healthcare Providers:  02/23/2021  This test is no t yet approved or cleared by the 02/19/21 FDA and  has been authorized for detection and/or diagnosis of SARS-CoV-2 by FDA under an Emergency Use Authorization (EUA). This EUA will remain  in effect (meaning this test can be used) for the duration of the COVID-19 declaration under Section 564(b)(1) of the Act, 21 U.S.C.section 360bbb-3(b)(1), unless the authorization is terminated  or revoked sooner.  Influenza A by PCR NEGATIVE NEGATIVE Final   Influenza B by PCR NEGATIVE NEGATIVE Final    Comment: (NOTE) The Xpert Xpress SARS-CoV-2/FLU/RSV plus assay is intended as an aid in the diagnosis of influenza from Nasopharyngeal swab specimens and should not be used as a sole basis for treatment. Nasal washings and aspirates are unacceptable for Xpert Xpress SARS-CoV-2/FLU/RSV testing.  Fact Sheet for Patients: BloggerCourse.com  Fact Sheet for Healthcare Providers: SeriousBroker.it  This test is not yet approved or cleared by the [place] FDA and has been authorized for detection and/or diagnosis of SARS-CoV-2 by FDA under an Emergency Use Authorization (EUA). This EUA will remain in effect (meaning this test can be used) for the duration of the COVID-19 declaration under Section 564(b)(1) of the Act, 21 U.S.C. section 360bbb-3(b)(1), unless the authorization is terminated or revoked.  Performed at [facility], [address] [place], [place] [address]   Urine culture     Status: None   Collection Time: 12/20/20  4:51 AM   Specimen: In/Out Cath  Urine  Result Value Ref Range Status   Specimen Description   Final    IN/OUT CATH URINE Performed at [facility], [address] [place], [place] [address]    Special Requests   Final    NONE Performed at [facility], [address] [place], [place] [address]    Culture   Final    NO GROWTH Performed at [facility], [address] [place], [place] [address]    Report Status 12/21/2020 FINAL  Final  MRSA PCR Screening     Status: None   Collection Time: 12/20/20  8:20 AM   Specimen: Nasopharyngeal  Result Value Ref Range Status   MRSA by PCR NEGATIVE NEGATIVE Final    Comment:        The GeneXpert MRSA Assay (FDA approved for NASAL specimens only), is one component of a comprehensive MRSA colonization surveillance program. It is not intended to diagnose MRSA infection nor to guide or monitor treatment for MRSA infections. Performed at [facility], [address], [place], [place] [address]   Resp Panel by RT-PCR (Flu A&B, Covid) Nasopharyngeal Swab     Status: None   Collection Time: 12/23/20  3:15 PM   Specimen: Nasopharyngeal Swab; Nasopharyngeal(NP) swabs in vial transport medium  Result Value Ref Range Status   SARS Coronavirus 2 by RT PCR NEGATIVE NEGATIVE Final    Comment: (NOTE) SARS-CoV-2 target nucleic acids are NOT DETECTED.  The SARS-CoV-2 RNA is generally detectable in upper respiratory specimens during the acute phase of infection. The lowest concentration of SARS-CoV-2 viral copies this assay can detect is 138 copies/mL. A negative result does not preclude SARS-Cov-2 infection and should not be used as the sole basis for treatment or other patient management decisions. A negative result may occur with  improper specimen collection/handling, submission of specimen other than nasopharyngeal swab, presence of viral mutation(s) within the areas targeted by this assay, and inadequate number of viral copies(<138 copies/mL). A negative result must be  combined with clinical observations, patient history, and epidemiological information. The expected result is Negative.  Fact Sheet for Patients:  BloggerCourse.com  Fact Sheet for Healthcare Providers:  SeriousBroker.it  This test is no t yet approved or cleared by the [place] FDA and  has been authorized for detection and/or diagnosis of SARS-CoV-2 by FDA under an Emergency Use Authorization (EUA). This EUA will remain  in effect (meaning this test can be used)  for the duration of the COVID-19 declaration under Section 564(b)(1) of the Act, 21 U.S.C.section 360bbb-3(b)(1), unless the authorization is terminated  or revoked sooner.       Influenza A by PCR NEGATIVE NEGATIVE Final   Influenza B by PCR NEGATIVE NEGATIVE Final    Comment: (NOTE) The Xpert Xpress SARS-CoV-2/FLU/RSV plus assay is intended as an aid in the diagnosis of influenza from Nasopharyngeal swab specimens and should not be used as a sole basis for treatment. Nasal washings and aspirates are unacceptable for Xpert Xpress SARS-CoV-2/FLU/RSV testing.  Fact Sheet for Patients: BloggerCourse.com  Fact Sheet for Healthcare Providers: SeriousBroker.it  This test is not yet approved or cleared by the [place] FDA and has been authorized for detection and/or diagnosis of SARS-CoV-2 by FDA under an Emergency Use Authorization (EUA). This EUA will remain in effect (meaning this test can be used) for the duration of the COVID-19 declaration under Section 564(b)(1) of the Act, 21 U.S.C. section 360bbb-3(b)(1), unless the authorization is terminated or revoked.  Performed at [facility], [address], [place], [place] [address]   Blood culture (routine x 2)     Status: None (Preliminary result)   Collection Time: 12/23/20  4:01 PM   Specimen: BLOOD  Result Value Ref Range Status   Specimen Description  BLOOD RIGHT ANTECUBITAL  Final   Special Requests   Final    BOTTLES DRAWN AEROBIC AND ANAEROBIC Blood Culture adequate volume   Culture   Final    NO GROWTH 2 DAYS Performed at [facility], [address], [place], [place] [address]    Report Status PENDING  Incomplete  Blood culture (routine x 2)     Status: None (Preliminary result)   Collection Time: 12/23/20  4:01 PM   Specimen: BLOOD  Result Value Ref Range Status   Specimen Description BLOOD LEFT ANTECUBITAL  Final   Special Requests   Final    BOTTLES DRAWN AEROBIC AND ANAEROBIC Blood Culture adequate volume   Culture   Final    NO GROWTH 2 DAYS Performed at [facility], [address], [place], [place] [address]    Report Status PENDING  Incomplete     Radiology Studies: CT HEAD WO CONTRAST  Result Date: 12/23/2020 CLINICAL DATA:  Dizziness and fall today. EXAM: CT HEAD WITHOUT CONTRAST TECHNIQUE: Contiguous axial images were obtained from the base of the skull through the vertex without intravenous contrast. COMPARISON:  Head CT 02/02/2018 FINDINGS: Brain: No evidence of acute infarction, hemorrhage, hydrocephalus, extra-axial collection or mass lesion/mass effect. Slight asymmetry of the lateral ventricles with right larger than left is unchanged from prior exam, and of doubtful clinical significance. Vascular: No hyperdense vessel. Skull: No fracture or focal lesion. Sinuses/Orbits: No evidence of acute facial bone fracture of the visualized facial bones. There is rightward nasal septal deviation. Orbits are unremarkable. Mastoid air cells are clear. Other: None. IMPRESSION: No acute intracranial abnormality. No skull fracture. Electronically Signed   By: Narda Rutherford M.D.   On: 12/23/2020 17:24   CT CHEST W CONTRAST  Result Date: 12/23/2020 CLINICAL DATA:  Worsening shortness of breath and hypoxia. History of recurrent aspiration pneumonia and pulmonary edema. EXAM: CT CHEST WITH CONTRAST TECHNIQUE: Multidetector CT  imaging of the chest was performed during intravenous contrast administration. CONTRAST:  23mL OMNIPAQUE IOHEXOL 300 MG/ML  SOLN COMPARISON:  Most recent radiograph earlier today. Most recent CT 11/05/2020 FINDINGS: Cardiovascular: Mild aortic atherosclerosis without aneurysm. Exam not tailored for pulmonary artery evaluation, no  significant pulmonary embolus. Heart size normal. No pericardial effusion. Mediastinum/Nodes: There is wall thickening of the distal esophagus. The esophagus is dilated and contains intraluminal fluid/debris. Scattered small mediastinal lymph nodes not enlarged by size criteria. 13 mm right hilar node, previously 15 mm. No thyroid nodule. Lungs/Pleura: Multifocal patchy, nodular and ground-glass airspace opacities. Findings are present in all lobes of both lungs, greatest involving the left upper lobe on the current exam. Occasional a segmental bronchial thickening in the left lower lobe. Trachea and central bronchi are otherwise patent. No pleural effusion. No septal thickening. Upper Abdomen: No acute upper abdominal findings. Musculoskeletal: There are no acute or suspicious osseous abnormalities. Mild degenerative change of the right glenohumeral joint. Scattered thoracic spondylosis. IMPRESSION: 1. Multifocal patchy, nodular and ground-glass airspace opacities throughout all lobes of both lungs, greatest involving the left upper lobe on the current exam. Findings most consistent with multifocal pneumonia. Aspiration is considered given esophageal findings, however distribution is atypical. 2. Dilated esophagus containing intraluminal fluid and debris, with mild thickening distally. Similar findings to prior exam. Aortic Atherosclerosis (ICD10-I70.0). Electronically Signed   By: Narda RutherfordMelanie  Sanford M.D.   On: 12/23/2020 21:20   Portable chest 1 View  Result Date: 12/24/2020 CLINICAL DATA:  Aspiration EXAM: PORTABLE CHEST 1 VIEW COMPARISON:  Dec 23, 2020 chest radiograph and chest CT  FINDINGS: Multifocal airspace opacity, considerably more severe on the left than on the right, is stable. No well-defined consolidation. The heart is upper normal in size with pulmonary vascularity normal. No adenopathy. No bone lesions. IMPRESSION: Multifocal airspace opacity, more on the right than the left, persists without change. No new opacity evident. Stable cardiac silhouette. Electronically Signed   By: Bretta BangWilliam  Woodruff III M.D.   On: 12/24/2020 08:17    Scheduled Meds: . aspirin EC  81 mg Oral QHS  . diltiazem  120 mg Oral Daily  . enoxaparin (LOVENOX) injection  40 mg Subcutaneous Q24H  . guaiFENesin  600 mg Oral BID  . ipratropium  0.5 mg Nebulization BID  . levalbuterol  0.63 mg Nebulization BID  . levothyroxine  25 mcg Oral Q0600  . multivitamin with minerals  1 tablet Oral Daily  . pantoprazole  40 mg Oral BID  . senna  1 tablet Oral BID   Continuous Infusions: . 0.9 % NaCl with KCl 20 mEq / L 75 mL/hr at 12/23/20 1954  . azithromycin 500 mg (12/24/20 1747)  . cefTRIAXone (ROCEPHIN)  IV 2 g (12/24/20 2248)  . metronidazole 500 mg (12/25/20 0916)     LOS: 2 days   Time spent: 38 mins   Yahye Siebert Laural BenesJohnson, MD How to contact the [facility] Attending or Consulting provider 7A - 7P or covering provider during after hours 7P -7A, for this patient?  1. Check the care team in [facility] and look for a) attending/consulting [facility] provider listed and b) the [facility] team listed 2. Log into [email or web address] and use [place]'s universal password to access. If you do not have the password, please contact the hospital operator. 3. Locate the [facility] provider you are looking for under [facility] and page to a number that you can be directly reached. 4. If you still have difficulty reaching the provider, please page the [facility] (Director on Call) for the Hospitalists listed on amion for assistance.  12/25/2020, 1:15 PM

## 2020-12-26 ENCOUNTER — Encounter (HOSPITAL_COMMUNITY): Payer: Self-pay | Admitting: Internal Medicine

## 2020-12-26 MED ORDER — ASPIRIN EC 81 MG PO TBEC: 81.0000 mg | DELAYED_RELEASE_TABLET | Freq: Every day | ORAL | Status: DC

## 2020-12-26 MED ORDER — AMOXICILLIN-POT CLAVULANATE 500-125 MG PO TABS: 500.0000 mg | ORAL_TABLET | Freq: Three times a day (TID) | ORAL | 0 refills | Status: AC

## 2020-12-26 MED ORDER — AMOXICILLIN-POT CLAVULANATE 600-42.9 MG/5ML PO SUSR: 1000.0000 mg | Freq: Two times a day (BID) | ORAL | Status: DC

## 2020-12-26 MED ORDER — ALBUTEROL SULFATE (2.5 MG/3ML) 0.083% IN NEBU: 2.5000 mg | INHALATION_SOLUTION | RESPIRATORY_TRACT | 2 refills | Status: DC | PRN

## 2020-12-26 MED ORDER — AMOXICILLIN-POT CLAVULANATE 500-125 MG PO TABS: 2.0000 | ORAL_TABLET | Freq: Two times a day (BID) | ORAL | Status: DC

## 2020-12-26 MED ORDER — AMOXICILLIN-POT CLAVULANATE 875-125 MG PO TABS: 1.0000 | ORAL_TABLET | Freq: Two times a day (BID) | ORAL | Status: DC

## 2020-12-26 MED ORDER — AMOXICILLIN-POT CLAVULANATE 500-125 MG PO TABS
500.0000 mg | ORAL_TABLET | Freq: Three times a day (TID) | ORAL | Status: DC
  Administered 2020-12-26 (×2): 500 mg via ORAL
  Filled 2020-12-26 (×2): qty 1

## 2020-12-26 NOTE — Progress Notes (Signed)
SATURATION QUALIFICATIONS: (This note is used to comply with regulatory documentation for home oxygen)  Patient Saturations on Room Air at Rest = 87%  Patient Saturations on Room Air while Ambulating = Did not attempt since resting O2 sat on RA was 87%  Patient Saturations on 4 Liters of oxygen while Ambulating = 94%  Please briefly explain why patient needs home oxygen:  Patient's O2 sat drops to 87% when resting on RA.

## 2020-12-26 NOTE — Discharge Summary (Signed)
Physician Discharge Summary  Christian Ellison WUX:[id number] DOB: 09-04-1960 DOA: 12/23/2020  PCP: Anabel Halon, MD  Admit date: 12/23/2020 Discharge date: 12/26/2020  Admitted From:  HOME  Disposition:  HOME   Recommendations for Outpatient Follow-up:  1. Follow up with PCP in 1 weeks 2. Follow up with Dr. Rhona Raider at [facility] in 1 week 3. Follow up with Dr. Karilyn Cota in 2 weeks  Home Health:  DME HOME OXYGEN 4L/m  Discharge Condition: STABLE   CODE STATUS: FULL DIET: full liquid diet with strict aspiration precautions   Brief Hospitalization Summary: Please see all hospital notes, images, labs for full details of the hospitalization. ADMISSION HPI:   HPI: Christian Ellison is a 60 y.o. 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  Recently was admitted on 12/20/2020 for shortness of breath and aspiration pneumonia and subsequently signed out AGAINST MEDICAL ADVICE on 12/21/2020.  Patient went home and he started having increasing worsening shortness of breath.  EMS was called and upon arrival his O2 saturations were noted to be in 60s.  They placed patient on supplemental oxygen and he undergone 6 L with saturation in the mid 60s.  He denies chest pain or discomfort but has notices increasing productive cough.  Denied any flu or COVID exposures and denies any new or recent aspiration or choking meds and wants to eat a cheeseburger.  Denied headache or chest pain on evaluation but was extremely somnolent and  drowsy at the time of admission.  Right prior to me evaluating patient patient fell in the ED hitting his head after he tried to use a urinal.  He was asked admit this patient for his worsening pneumonia and acute respiratory failure with hypoxia as well as severe sepsis from his multifocal pneumonia.  ED Course: In the ED patient had basic blood work done as well as an EKG and a chest x-ray.  He is given IV vancomycin and IV cefepime and given 2 breathing treatments and 1 remaining albuterol inhaler as well as albuterol neb and DuoNeb.  Of note coronavirus testing would not be done and is still pending and blood cultures are being obtained now.  Hospital Course - Pt was admitted with findings of severe sepsis thought secondary to recurrent aspiration pneumonia that he has been dealing with for quite some time and had been referred to [facility] for treatment by the thoracic team.  He was treated with IV antibiotics and IV fluids, supplemental oxygen and supportive care.  Unfortunately has lost IV access and refusing absolutely to have further IV access placed.  After counseling with him patient told me that he is leaving to go home and is not staying here longer for further care.  We will discharge him home today and arrange for home oxygen as he is on 4L/min.  He will be sent with oral prescription for augmentin 500 gm TID x 5 additional days with strict aspiration precautions.  He will be maintained on his regular full liquid diet.  I tried to counsel him that it is a bad idea to  keep leaving the hospital like he has been doing but if he refuses further IV access there there is nothing further to offer in the inpatient setting and he will discharge home on supplemental oxygen and oral antibiotics.  He verbalized understanding. Due to his history of substance abuse and poor compliance he is high risk for readmission.    Discharge Diagnoses:  Active Problems:   Multifocal pneumonia  Discharge  Instructions:  Allergies as of 12/26/2020   No Known Allergies     Medication List    STOP taking these medications   amoxicillin-clavulanate 875-125 MG tablet Commonly known as: AUGMENTIN Replaced by: amoxicillin-clavulanate 500-125 MG tablet     TAKE these medications   acetaminophen 325 MG tablet Commonly known as: TYLENOL Take 2 tablets (650 mg total) by mouth every 6 (six) hours as needed for mild pain (or Fever >/= 101).   albuterol 108 (90 Base) MCG/ACT inhaler Commonly known as: VENTOLIN HFA Inhale 2 puffs into the lungs every 6 (six) hours as needed for wheezing or shortness of breath.   ALPRAZolam 1 MG tablet Commonly known as: XANAX Take 2 mg by mouth at bedtime as needed for anxiety.   amoxicillin-clavulanate 500-125 MG tablet Commonly known as: AUGMENTIN Take 1 tablet (500 mg total) by mouth every 8 (eight) hours for 5 days. Replaces: amoxicillin-clavulanate 875-125 MG tablet   aspirin EC 81 MG tablet Take 1 tablet (81 mg total) by mouth at bedtime.   diltiazem 120 MG 24 hr capsule Commonly known as: DILACOR XR Take 1 capsule (120 mg total) by mouth daily.   levothyroxine 25 MCG tablet Commonly known as: SYNTHROID TAKE 1 TABLET BY MOUTH EVERY DAY What changed: when to take this   multivitamin with minerals Tabs tablet Take 1 tablet by mouth daily. 50+   pantoprazole 40 MG tablet Commonly known as: Protonix Take 1 tablet (40 mg total) by mouth 2 (two) times daily.   TAGAMET PO Take 75 mg by mouth daily as needed (if he feels his food coming up after meal).            Durable Medical Equipment  (From admission, onward)         Start     Ordered   12/26/20 1056  For home use only DME oxygen  Once       Question Answer Comment  Length of Need Lifetime   Mode or (Route) Nasal cannula   Liters per Minute 4   Frequency Continuous (stationary and portable oxygen unit needed)   Oxygen conserving device Yes   Oxygen delivery system Gas       12/26/20 1055          Follow-up Information    Harvest Forest, MD. Schedule an appointment as soon as possible for a visit in 1 week(s).   Specialty: Thoracic Surgery Contact information: [address] [place] [address] [phone]        Anabel Halon, MD. Schedule an appointment as soon as possible for a visit in 1 week(s).   Specialty: Internal Medicine Contact information: [address] [place] [address] [phone]        Malissa Hippo, MD. Schedule an appointment as soon as possible for a visit in 2 week(s).   Specialty: Gastroenterology Contact information: [address] [place] [address] [phone]              No Known Allergies Allergies as  of 12/26/2020   No Known Allergies     Medication List    STOP taking these medications   amoxicillin-clavulanate 875-125 MG tablet Commonly known as: AUGMENTIN Replaced by: amoxicillin-clavulanate 500-125 MG tablet     TAKE these medications   acetaminophen 325 MG tablet Commonly known as: TYLENOL Take 2 tablets (650 mg total) by mouth every 6 (six) hours as needed for mild pain (or Fever >/= 101).   albuterol 108 (90 Base) MCG/ACT inhaler Commonly known as: VENTOLIN HFA Inhale 2 puffs into the lungs every 6 (six) hours as needed for wheezing or shortness of breath.   ALPRAZolam 1 MG tablet Commonly known as: XANAX Take 2 mg by mouth at bedtime as needed for anxiety.   amoxicillin-clavulanate 500-125 MG tablet Commonly known as: AUGMENTIN Take 1 tablet (500 mg total) by mouth every 8 (eight) hours for 5 days. Replaces: amoxicillin-clavulanate 875-125 MG tablet   aspirin EC 81 MG tablet Take 1 tablet (81 mg total) by mouth at bedtime.   diltiazem 120 MG 24 hr capsule Commonly known as: DILACOR XR Take 1 capsule (120 mg total) by mouth daily.   levothyroxine 25 MCG tablet Commonly known as: SYNTHROID TAKE 1 TABLET BY MOUTH EVERY  DAY What changed: when to take this   multivitamin with minerals Tabs tablet Take 1 tablet by mouth daily. 50+   pantoprazole 40 MG tablet Commonly known as: Protonix Take 1 tablet (40 mg total) by mouth 2 (two) times daily.   TAGAMET PO Take 75 mg by mouth daily as needed (if he feels his food coming up after meal).            Durable Medical Equipment  (From admission, onward)         Start     Ordered   12/26/20 1056  For home use only DME oxygen  Once       Question Answer Comment  Length of Need Lifetime   Mode or (Route) Nasal cannula   Liters per Minute 4   Frequency Continuous (stationary and portable oxygen unit needed)   Oxygen conserving device Yes   Oxygen delivery system Gas      12/26/20 1055          Procedures/Studies: CT HEAD WO CONTRAST  Result Date: 12/23/2020 CLINICAL DATA:  Dizziness and fall today. EXAM: CT HEAD WITHOUT CONTRAST TECHNIQUE: Contiguous axial images were obtained from the base of the skull through the vertex without intravenous contrast. COMPARISON:  Head CT 02/02/2018 FINDINGS: Brain: No evidence of acute infarction, hemorrhage, hydrocephalus, extra-axial collection or mass lesion/mass effect. Slight asymmetry of the lateral ventricles with right larger than left is unchanged from prior exam, and of doubtful clinical significance. Vascular: No hyperdense vessel. Skull: No fracture or focal lesion. Sinuses/Orbits: No evidence of acute facial bone fracture of the visualized facial bones. There is rightward nasal septal deviation. Orbits are unremarkable. Mastoid air cells are clear. Other: None. IMPRESSION: No acute intracranial abnormality. No skull fracture. Electronically Signed   By: Narda Rutherford M.D.   On: 12/23/2020 17:24   CT CHEST W CONTRAST  Result Date: 12/23/2020 CLINICAL DATA:  Worsening shortness of breath and hypoxia. History of recurrent aspiration pneumonia and pulmonary edema. EXAM: CT CHEST WITH CONTRAST TECHNIQUE:  Multidetector CT imaging of the chest was performed during intravenous contrast administration. CONTRAST:  75mL OMNIPAQUE IOHEXOL 300 MG/ML  SOLN COMPARISON:  Most recent radiograph earlier today. Most recent CT 11/05/2020 FINDINGS: Cardiovascular: Mild aortic atherosclerosis without aneurysm.  Exam not tailored for pulmonary artery evaluation, no significant pulmonary embolus. Heart size normal. No pericardial effusion. Mediastinum/Nodes: There is wall thickening of the distal esophagus. The esophagus is dilated and contains intraluminal fluid/debris. Scattered small mediastinal lymph nodes not enlarged by size criteria. 13 mm right hilar node, previously 15 mm. No thyroid nodule. Lungs/Pleura: Multifocal patchy, nodular and ground-glass airspace opacities. Findings are present in all lobes of both lungs, greatest involving the left upper lobe on the current exam. Occasional a segmental bronchial thickening in the left lower lobe. Trachea and central bronchi are otherwise patent. No pleural effusion. No septal thickening. Upper Abdomen: No acute upper abdominal findings. Musculoskeletal: There are no acute or suspicious osseous abnormalities. Mild degenerative change of the right glenohumeral joint. Scattered thoracic spondylosis. IMPRESSION: 1. Multifocal patchy, nodular and ground-glass airspace opacities throughout all lobes of both lungs, greatest involving the left upper lobe on the current exam. Findings most consistent with multifocal pneumonia. Aspiration is considered given esophageal findings, however distribution is atypical. 2. Dilated esophagus containing intraluminal fluid and debris, with mild thickening distally. Similar findings to prior exam. Aortic Atherosclerosis (ICD10-I70.0). Electronically Signed   By: Narda RutherfordMelanie  Sanford M.D.   On: 12/23/2020 21:20   Portable chest 1 View  Result Date: 12/24/2020 CLINICAL DATA:  Aspiration EXAM: PORTABLE CHEST 1 VIEW COMPARISON:  Dec 23, 2020 chest radiograph  and chest CT FINDINGS: Multifocal airspace opacity, considerably more severe on the left than on the right, is stable. No well-defined consolidation. The heart is upper normal in size with pulmonary vascularity normal. No adenopathy. No bone lesions. IMPRESSION: Multifocal airspace opacity, more on the right than the left, persists without change. No new opacity evident. Stable cardiac silhouette. Electronically Signed   By: Bretta BangWilliam  Woodruff III M.D.   On: 12/24/2020 08:17   DG Chest Port 1 View  Result Date: 12/23/2020 CLINICAL DATA:  Pt brought in by EMS for shortness of breath. Pt has extensive history of aspiration pneumonia. Pt was brought to the hospital last week for pneumonia but left AMA. Pt treated for sepsis by EMS. Pt seemed very confused and asked where he was at but was able to state name and DOB. Hx of sepsis due to pneumonia 03/11/2019, PE 02/02/2018, and aspiration pneumonia 05/26/2018. Covid test negative 12/20/2020. EXAM: PORTABLE CHEST 1 VIEW COMPARISON:  12/19/2020 and older exams. FINDINGS: Heart normal in size.  No mediastinal or hilar masses. Interstitial and small patchy areas of airspace opacity are noted in the left lung, minimally in the right mid to lower lung, similar to the prior chest radiograph. Are small areas of patchy opacity in the left upper lobe that appear new from the prior study. No pleural effusion or pneumothorax. Skeletal structures are grossly intact. IMPRESSION: 1. Interstitial and patchy airspace lung opacities, mostly on the left, with a small area of increased opacity in the left upper lung compared to the recent prior study. No other change from the 12/19/2020 exam. Findings consistent with multifocal, possibly atypical, pneumonia. No evidence of pulmonary edema. Electronically Signed   By: Amie Portlandavid  Ormond M.D.   On: 12/23/2020 11:01   DG Chest Port 1 View  Result Date: 12/20/2020 CLINICAL DATA:  Shortness of breath EXAM: PORTABLE CHEST 1 VIEW COMPARISON:   11/05/2020, CT 11/05/2020 FINDINGS: Interval development of patchy airspace disease throughout the left greater than right thorax. No pleural effusion. Normal cardiomediastinal silhouette. No pneumothorax. IMPRESSION: Diffuse left greater than right patchy pulmonary opacity suspect for respiratory infection, potential atypical pneumonia. Electronically  Signed   By: Jasmine Pang M.D.   On: 12/20/2020 00:00      Subjective: Pt reports that he is not going to allow another IV to be placed and he is going home today.  He is not willing to stay for further treatments.  He pulled off his oxygen.   Discharge Exam: Vitals:   12/26/20 0433 12/26/20 0723  BP: 113/71   Pulse: 93   Resp: 19   Temp: 98 F (36.7 C)   SpO2: 97% 96%   Vitals:   12/25/20 2001 12/25/20 2012 12/26/20 0433 12/26/20 0723  BP:  117/81 113/71   Pulse:  98 93   Resp:  19 19   Temp:  98.4 F (36.9 C) 98 F (36.7 C)   TempSrc:      SpO2: (!) 84% 95% 97% 96%  Weight:      Height:       General: Pt is alert, awake, not in acute distress, oriented x 3.   Cardiovascular: RRR, S1/S2 +, no rubs, no gallops Respiratory: scattered rales but good air movement bilaterally, no wheezing, no rhonchi Abdominal: Soft, NT, ND, bowel sounds + Extremities: no edema, no cyanosis   The results of significant diagnostics from this hospitalization (including imaging, microbiology, ancillary and laboratory) are listed below for reference.     Microbiology: Recent Results (from the past 240 hour(s))  Blood Culture (routine x 2)     Status: None   Collection Time: 12/19/20 11:56 PM   Specimen: BLOOD  Result Value Ref Range Status   Specimen Description BLOOD LEFT ANTECUBITAL  Final   Special Requests AEROBIC BOTTLE ONLY Blood Culture adequate volume  Final   Culture   Final    NO GROWTH 5 DAYS Performed at [facility], [address], [place], [place] [address]    Report Status 12/24/2020 FINAL  Final  Blood Culture (routine x 2)      Status: None   Collection Time: 12/19/20 11:56 PM   Specimen: BLOOD  Result Value Ref Range Status   Specimen Description BLOOD RIGHT ANTECUBITAL  Final   Special Requests AEROBIC BOTTLE ONLY Blood Culture adequate volume  Final   Culture   Final    NO GROWTH 5 DAYS Performed at [facility], [address] [place], [place] [address]    Report Status 12/24/2020 FINAL  Final  Resp Panel by RT-PCR (Flu A&B, Covid) Nasopharyngeal Swab     Status: None   Collection Time: 12/20/20 12:28 AM   Specimen: Nasopharyngeal Swab; Nasopharyngeal(NP) swabs in vial transport medium  Result Value Ref Range Status   SARS Coronavirus 2 by RT PCR NEGATIVE NEGATIVE Final    Comment: (NOTE) SARS-CoV-2 target nucleic acids are NOT DETECTED.  The SARS-CoV-2 RNA is generally detectable in upper respiratory specimens during the acute phase of infection. The lowest concentration of SARS-CoV-2 viral copies this assay can detect is 138 copies/mL. A negative result does not preclude SARS-Cov-2 infection and should not be used as the sole basis for treatment or other patient management decisions. A negative result may occur with  improper specimen collection/handling, submission of specimen other than nasopharyngeal swab, presence of viral mutation(s) within the areas targeted by this assay, and inadequate number of viral copies(<138 copies/mL). A negative result must be combined with clinical observations, patient history, and epidemiological information. The expected result is Negative.  Fact Sheet for Patients:  BloggerCourse.com  Fact Sheet for Healthcare Providers:  SeriousBroker.it  This test is no t  yet approved or cleared by the [place] and  has been authorized for detection and/or diagnosis of SARS-CoV-2 by FDA under an Emergency Use Authorization (EUA). This EUA will remain  in effect (meaning this test can be used) for the duration of  the COVID-19 declaration under Section 564(b)(1) of the Act, 21 U.S.C.section 360bbb-3(b)(1), unless the authorization is terminated  or revoked sooner.       Influenza A by PCR NEGATIVE NEGATIVE Final   Influenza B by PCR NEGATIVE NEGATIVE Final    Comment: (NOTE) The Xpert Xpress SARS-CoV-2/FLU/RSV plus assay is intended as an aid in the diagnosis of influenza from Nasopharyngeal swab specimens and should not be used as a sole basis for treatment. Nasal washings and aspirates are unacceptable for Xpert Xpress SARS-CoV-2/FLU/RSV testing.  Fact Sheet for Patients: BloggerCourse.com  Fact Sheet for Healthcare Providers: SeriousBroker.it  This test is not yet approved or cleared by the [place] FDA and has been authorized for detection and/or diagnosis of SARS-CoV-2 by FDA under an Emergency Use Authorization (EUA). This EUA will remain in effect (meaning this test can be used) for the duration of the COVID-19 declaration under Section 564(b)(1) of the Act, 21 U.S.C. section 360bbb-3(b)(1), unless the authorization is terminated or revoked.  Performed at [facility], [address] [place], [place] [address]   Urine culture     Status: None   Collection Time: 12/20/20  4:51 AM   Specimen: In/Out Cath Urine  Result Value Ref Range Status   Specimen Description   Final    IN/OUT CATH URINE Performed at [facility], [address] [place], [place] [address]    Special Requests   Final    NONE Performed at [facility], [address] [place], [place] [address]    Culture   Final    NO GROWTH Performed at [facility], [address] [place], [place] [address]    Report Status 12/21/2020 FINAL  Final  MRSA PCR Screening     Status: None   Collection Time: 12/20/20  8:20 AM   Specimen: Nasopharyngeal  Result Value Ref Range Status   MRSA by PCR NEGATIVE NEGATIVE Final    Comment:        The GeneXpert MRSA  Assay (FDA approved for NASAL specimens only), is one component of a comprehensive MRSA colonization surveillance program. It is not intended to diagnose MRSA infection nor to guide or monitor treatment for MRSA infections. Performed at [facility], [address], [place], [place] [address]   Resp Panel by RT-PCR (Flu A&B, Covid) Nasopharyngeal Swab     Status: None   Collection Time: 12/23/20  3:15 PM   Specimen: Nasopharyngeal Swab; Nasopharyngeal(NP) swabs in vial transport medium  Result Value Ref Range Status   SARS Coronavirus 2 by RT PCR NEGATIVE NEGATIVE Final    Comment: (NOTE) SARS-CoV-2 target nucleic acids are NOT DETECTED.  The SARS-CoV-2 RNA is generally detectable in upper respiratory specimens during the acute phase of infection. The lowest concentration of SARS-CoV-2 viral copies this assay can detect is 138 copies/mL. A negative result does not preclude SARS-Cov-2 infection and should not be used as the sole basis for treatment or other patient management decisions. A negative result may occur with  improper specimen collection/handling, submission of specimen other than nasopharyngeal swab, presence of viral mutation(s) within the areas targeted by this assay, and inadequate number of viral copies(<138 copies/mL). A negative result must be combined with clinical observations,  patient history, and epidemiological information. The expected result is Negative.  Fact Sheet for Patients:  BloggerCourse.com  Fact Sheet for Healthcare Providers:  SeriousBroker.it  This test is no t yet approved or cleared by the [place] FDA and  has been authorized for detection and/or diagnosis of SARS-CoV-2 by FDA under an Emergency Use Authorization (EUA). This EUA will remain  in effect (meaning this test can be used) for the duration of the COVID-19 declaration under Section 564(b)(1) of the Act, 21 U.S.C.section  360bbb-3(b)(1), unless the authorization is terminated  or revoked sooner.       Influenza A by PCR NEGATIVE NEGATIVE Final   Influenza B by PCR NEGATIVE NEGATIVE Final    Comment: (NOTE) The Xpert Xpress SARS-CoV-2/FLU/RSV plus assay is intended as an aid in the diagnosis of influenza from Nasopharyngeal swab specimens and should not be used as a sole basis for treatment. Nasal washings and aspirates are unacceptable for Xpert Xpress SARS-CoV-2/FLU/RSV testing.  Fact Sheet for Patients: BloggerCourse.com  Fact Sheet for Healthcare Providers: SeriousBroker.it  This test is not yet approved or cleared by the [place] FDA and has been authorized for detection and/or diagnosis of SARS-CoV-2 by FDA under an Emergency Use Authorization (EUA). This EUA will remain in effect (meaning this test can be used) for the duration of the COVID-19 declaration under Section 564(b)(1) of the Act, 21 U.S.C. section 360bbb-3(b)(1), unless the authorization is terminated or revoked.  Performed at [facility], [address], [place], [place] [address]   Blood culture (routine x 2)     Status: None (Preliminary result)   Collection Time: 12/23/20  4:01 PM   Specimen: BLOOD  Result Value Ref Range Status   Specimen Description BLOOD RIGHT ANTECUBITAL  Final   Special Requests   Final    BOTTLES DRAWN AEROBIC AND ANAEROBIC Blood Culture adequate volume   Culture   Final    NO GROWTH 3 DAYS Performed at [facility], [address], [place], [place] [address]    Report Status PENDING  Incomplete  Blood culture (routine x 2)     Status: None (Preliminary result)   Collection Time: 12/23/20  4:01 PM   Specimen: BLOOD  Result Value Ref Range Status   Specimen Description BLOOD LEFT ANTECUBITAL  Final   Special Requests   Final    BOTTLES DRAWN AEROBIC AND ANAEROBIC Blood Culture adequate volume   Culture   Final    NO GROWTH 3 DAYS Performed  at [facility], [address] [place], [place] [address]    Report Status PENDING  Incomplete     Labs: BNP (last 3 results) Recent Labs    11/05/20 0311 12/23/20 1109 12/23/20 2100  BNP 35.0 19.0 25.0   Basic Metabolic Panel: Recent Labs  Lab 12/19/20 2356 12/20/20 0521 12/21/20 0534 12/23/20 1109 12/24/20 0650  NA 138 139 137 138 139  K 3.9 3.9 3.4* 3.8 3.8  CL 100 106 98 99 102  CO2 29 28 31  32 29  GLUCOSE 122* 113* 108* 132* 121*  BUN 13 10 10 14 12   CREATININE 0.85 0.69 0.75 0.70 0.64  CALCIUM 8.7* 7.8* 8.6* 8.2* 8.3*  MG 2.0  --   --   --   --   PHOS 1.7*  --   --   --   --    Liver Function Tests: Recent Labs  Lab 12/19/20 2356 12/20/20 0521 12/21/20 0534 12/23/20 1109 12/24/20 0650  AST 20 13* 13*  10* 10*  ALT 18 12 13 11 10   ALKPHOS 68 53 56 55 54  BILITOT 0.5 0.6 1.2 0.4 0.6  PROT 7.9 6.1* 6.8 7.2 6.8  ALBUMIN 4.1 3.2* 3.3* 3.3* 3.1*   No results for input(s): LIPASE, AMYLASE in the last 168 hours. No results for input(s): AMMONIA in the last 168 hours. CBC: Recent Labs  Lab 12/19/20 2356 12/20/20 0521 12/21/20 0534 12/23/20 1109 12/24/20 0650  WBC 6.9 7.3 7.4 6.0 6.4  NEUTROABS 5.9 5.8  --   --   --   HGB 14.3 11.5* 11.4* 11.7* 11.3*  HCT 43.2 36.6* 36.9* 36.8* 36.3*  MCV 90.0 93.1 92.0 90.6 93.8  PLT 189 165 159 188 175   Cardiac Enzymes: No results for input(s): CKTOTAL, CKMB, CKMBINDEX, TROPONINI in the last 168 hours. BNP: Invalid input(s): POCBNP CBG: No results for input(s): GLUCAP in the last 168 hours. D-Dimer No results for input(s): DDIMER in the last 72 hours. Hgb A1c No results for input(s): HGBA1C in the last 72 hours. Lipid Profile No results for input(s): CHOL, HDL, LDLCALC, TRIG, CHOLHDL, LDLDIRECT in the last 72 hours. Thyroid function studies Recent Labs    12/23/20 1323  TSH 1.609   Anemia work up No results for input(s): VITAMINB12, FOLATE, FERRITIN, TIBC, IRON, RETICCTPCT in the last 72  hours. Urinalysis    Component Value Date/Time   COLORURINE YELLOW 12/23/2020 1616   APPEARANCEUR CLEAR 12/23/2020 1616   LABSPEC 1.023 12/23/2020 1616   PHURINE 6.0 12/23/2020 1616   GLUCOSEU NEGATIVE 12/23/2020 1616   HGBUR NEGATIVE 12/23/2020 1616   BILIRUBINUR NEGATIVE 12/23/2020 1616   KETONESUR 5 (A) 12/23/2020 1616   PROTEINUR 30 (A) 12/23/2020 1616   NITRITE NEGATIVE 12/23/2020 1616   LEUKOCYTESUR NEGATIVE 12/23/2020 1616   Sepsis Labs Invalid input(s): PROCALCITONIN,  WBC,  LACTICIDVEN Microbiology Recent Results (from the past 240 hour(s))  Blood Culture (routine x 2)     Status: None   Collection Time: 12/19/20 11:56 PM   Specimen: BLOOD  Result Value Ref Range Status   Specimen Description BLOOD LEFT ANTECUBITAL  Final   Special Requests AEROBIC BOTTLE ONLY Blood Culture adequate volume  Final   Culture   Final    NO GROWTH 5 DAYS Performed at [facility], [address], [place], [place] [address]    Report Status 12/24/2020 FINAL  Final  Blood Culture (routine x 2)     Status: None   Collection Time: 12/19/20 11:56 PM   Specimen: BLOOD  Result Value Ref Range Status   Specimen Description BLOOD RIGHT ANTECUBITAL  Final   Special Requests AEROBIC BOTTLE ONLY Blood Culture adequate volume  Final   Culture   Final    NO GROWTH 5 DAYS Performed at [facility], [address], [place], [place] [address]    Report Status 12/24/2020 FINAL  Final  Resp Panel by RT-PCR (Flu A&B, Covid) Nasopharyngeal Swab     Status: None   Collection Time: 12/20/20 12:28 AM   Specimen: Nasopharyngeal Swab; Nasopharyngeal(NP) swabs in vial transport medium  Result Value Ref Range Status   SARS Coronavirus 2 by RT PCR NEGATIVE NEGATIVE Final    Comment: (NOTE) SARS-CoV-2 target nucleic acids are NOT DETECTED.  The SARS-CoV-2 RNA is generally detectable in upper respiratory specimens during the acute phase of infection. The lowest concentration of SARS-CoV-2 viral copies this  assay can detect is 138 copies/mL. A negative result does not preclude SARS-Cov-2 infection and should not be used as  the sole basis for treatment or other patient management decisions. A negative result may occur with  improper specimen collection/handling, submission of specimen other than nasopharyngeal swab, presence of viral mutation(s) within the areas targeted by this assay, and inadequate number of viral copies(<138 copies/mL). A negative result must be combined with clinical observations, patient history, and epidemiological information. The expected result is Negative.  Fact Sheet for Patients:  BloggerCourse.com  Fact Sheet for Healthcare Providers:  SeriousBroker.it  This test is no t yet approved or cleared by the [place] FDA and  has been authorized for detection and/or diagnosis of SARS-CoV-2 by FDA under an Emergency Use Authorization (EUA). This EUA will remain  in effect (meaning this test can be used) for the duration of the COVID-19 declaration under Section 564(b)(1) of the Act, 21 U.S.C.section 360bbb-3(b)(1), unless the authorization is terminated  or revoked sooner.       Influenza A by PCR NEGATIVE NEGATIVE Final   Influenza B by PCR NEGATIVE NEGATIVE Final    Comment: (NOTE) The Xpert Xpress SARS-CoV-2/FLU/RSV plus assay is intended as an aid in the diagnosis of influenza from Nasopharyngeal swab specimens and should not be used as a sole basis for treatment. Nasal washings and aspirates are unacceptable for Xpert Xpress SARS-CoV-2/FLU/RSV testing.  Fact Sheet for Patients: BloggerCourse.com  Fact Sheet for Healthcare Providers: SeriousBroker.it  This test is not yet approved or cleared by the [place] FDA and has been authorized for detection and/or diagnosis of SARS-CoV-2 by FDA under an Emergency Use Authorization (EUA). This EUA will  remain in effect (meaning this test can be used) for the duration of the COVID-19 declaration under Section 564(b)(1) of the Act, 21 U.S.C. section 360bbb-3(b)(1), unless the authorization is terminated or revoked.  Performed at [facility], [address], [place], [place] [address]   Urine culture     Status: None   Collection Time: 12/20/20  4:51 AM   Specimen: In/Out Cath Urine  Result Value Ref Range Status   Specimen Description   Final    IN/OUT CATH URINE Performed at [facility], [address], [place], [place] [address]    Special Requests   Final    NONE Performed at [facility] [place] [facility], [address], [place], [place] [address]    Culture   Final    NO GROWTH Performed at [facility], [address] [place], [place] [address]    Report Status 12/21/2020 FINAL  Final  MRSA PCR Screening     Status: None   Collection Time: 12/20/20  8:20 AM   Specimen: Nasopharyngeal  Result Value Ref Range Status   MRSA by PCR NEGATIVE NEGATIVE Final    Comment:        The GeneXpert MRSA Assay (FDA approved for NASAL specimens only), is one component of a comprehensive MRSA colonization surveillance program. It is not intended to diagnose MRSA infection nor to guide or monitor treatment for MRSA infections. Performed at [facility], [address] [place], [place] [address]   Resp Panel by RT-PCR (Flu A&B, Covid) Nasopharyngeal Swab     Status: None   Collection Time: 12/23/20  3:15 PM   Specimen: Nasopharyngeal Swab; Nasopharyngeal(NP) swabs in vial transport medium  Result Value Ref Range Status   SARS Coronavirus 2 by RT PCR NEGATIVE NEGATIVE Final    Comment: (NOTE) SARS-CoV-2 target nucleic acids are NOT DETECTED.  The SARS-CoV-2 RNA is generally detectable in upper respiratory specimens during the acute phase  of infection. The lowest concentration of SARS-CoV-2 viral copies this assay can detect is 138 copies/mL. A negative result does not preclude  SARS-Cov-2 infection and should not be used as the sole basis for treatment or other patient management decisions. A negative result may occur with  improper specimen collection/handling, submission of specimen other than nasopharyngeal swab, presence of viral mutation(s) within the areas targeted by this assay, and inadequate number of viral copies(<138 copies/mL). A negative result must be combined with clinical observations, patient history, and epidemiological information. The expected result is Negative.  Fact Sheet for Patients:  BloggerCourse.com  Fact Sheet for Healthcare Providers:  SeriousBroker.it  This test is no t yet approved or cleared by the [place] FDA and  has been authorized for detection and/or diagnosis of SARS-CoV-2 by FDA under an Emergency Use Authorization (EUA). This EUA will remain  in effect (meaning this test can be used) for the duration of the COVID-19 declaration under Section 564(b)(1) of the Act, 21 U.S.C.section 360bbb-3(b)(1), unless the authorization is terminated  or revoked sooner.       Influenza A by PCR NEGATIVE NEGATIVE Final   Influenza B by PCR NEGATIVE NEGATIVE Final    Comment: (NOTE) The Xpert Xpress SARS-CoV-2/FLU/RSV plus assay is intended as an aid in the diagnosis of influenza from Nasopharyngeal swab specimens and should not be used as a sole basis for treatment. Nasal washings and aspirates are unacceptable for Xpert Xpress SARS-CoV-2/FLU/RSV testing.  Fact Sheet for Patients: BloggerCourse.com  Fact Sheet for Healthcare Providers: SeriousBroker.it  This test is not yet approved or cleared by the [place] FDA and has been authorized for detection and/or diagnosis of SARS-CoV-2 by FDA under an Emergency Use Authorization (EUA). This EUA will remain in effect (meaning this test can be used) for the duration of  the COVID-19 declaration under Section 564(b)(1) of the Act, 21 U.S.C. section 360bbb-3(b)(1), unless the authorization is terminated or revoked.  Performed at [facility], [address], [place], [place] [address]   Blood culture (routine x 2)     Status: None (Preliminary result)   Collection Time: 12/23/20  4:01 PM   Specimen: BLOOD  Result Value Ref Range Status   Specimen Description BLOOD RIGHT ANTECUBITAL  Final   Special Requests   Final    BOTTLES DRAWN AEROBIC AND ANAEROBIC Blood Culture adequate volume   Culture   Final    NO GROWTH 3 DAYS Performed at [facility], [address], [place], [place] [address]    Report Status PENDING  Incomplete  Blood culture (routine x 2)     Status: None (Preliminary result)   Collection Time: 12/23/20  4:01 PM   Specimen: BLOOD  Result Value Ref Range Status   Specimen Description BLOOD LEFT ANTECUBITAL  Final   Special Requests   Final    BOTTLES DRAWN AEROBIC AND ANAEROBIC Blood Culture adequate volume   Culture   Final    NO GROWTH 3 DAYS Performed at [facility], [address] [place], [place] [address]    Report Status PENDING  Incomplete   Time coordinating discharge: 38 mins   SIGNED:  Standley Dakins, MD  [facility] 12/26/2020, 10:57 AM How to contact the [facility] Attending or Consulting provider 7A - 7P or covering provider during after hours 7P -7A, for this patient?  1. Check the care team in [facility] and look for a) attending/consulting TRH provider listed and b) the [facility] team listed 2. Log into [email or web address] and  use 's universal password to access. If you do not have the password, please contact the hospital operator. 3. Locate the [facility] provider you are looking for under [facility] and page to a number that you can be directly reached. 4. If you still have difficulty reaching the provider, please page the [facility] (Director on Call) for the Hospitalists listed on amion for assistance.

## 2020-12-26 NOTE — TOC Transition Note (Signed)
Transition of Care [facility]) - CM/SW Discharge Note   Patient Details  Name: Christian Ellison MRN: [id number] Date of Birth: Jan 21, 1961  Transition of Care [facility]) CM/SW Contact:  Elliot Gault, LCSW Phone Number: 12/26/2020, 11:52 AM   Clinical Narrative:     Pt stable for dc and needs home O2 per MD. Sherron Monday with pt's wife to discuss in network O2 providers and referred to [facility] as requested. Morrie Sheldon at [facility] states that they will contact pt and his wife to discuss co-pays and make arrangements for delivery.  There are no other TOC needs for dc.  Expected Discharge Plan: Home/Self Care Barriers to Discharge: Barriers Resolved   Patient Goals and CMS Choice Patient states their goals for this hospitalization and ongoing recovery are:: go home CMS Medicare.gov Compare Post Acute Care list provided to:: Patient Represenative (must comment) Choice offered to / list presented to : Spouse  Expected Discharge Plan and Services Expected Discharge Plan: Home/Self Care In-house Referral: Clinical Social Work   Post Acute Care Choice: Durable Medical Equipment Living arrangements for the past 2 months: Single Family Home Expected Discharge Date: 12/26/20               DME Arranged: Oxygen DME Agency: Patsy Lager Date DME Agency Contacted: 12/26/20   Representative spoke with at DME Agency: Morrie Sheldon            Prior Living Arrangements/Services Living arrangements for the past 2 months: Single Family Home Lives with:: Spouse Patient language and need for interpreter reviewed:: Yes Do you feel safe going back to the place where you live?: Yes      Need for Family Participation in Patient Care: Yes (Comment) Care giver support system in place?: Yes (comment)   Criminal Activity/Legal Involvement Pertinent to Current Situation/Hospitalization: No - Comment as needed  Activities of Daily Living Home Assistive Devices/Equipment: None ADL Screening (condition at time of  admission) Patient's cognitive ability adequate to safely complete daily activities?: Yes Is the patient deaf or have difficulty hearing?: No Does the patient have difficulty seeing, even when wearing glasses/contacts?: No Does the patient have difficulty concentrating, remembering, or making decisions?: No Patient able to express need for assistance with ADLs?: Yes Does the patient have difficulty dressing or bathing?: No Independently performs ADLs?: Yes (appropriate for developmental age) Does the patient have difficulty walking or climbing stairs?: No Weakness of Legs: None Weakness of Arms/Hands: None  Permission Sought/Granted Permission sought to share information with : Oceanographer granted to share information with : Yes, Verbal Permission Granted     Permission granted to share info w AGENCY: [facility]        Emotional Assessment       Orientation: : Oriented to Self,Oriented to Place,Oriented to  Time,Oriented to Situation Alcohol / Substance Use: Not Applicable Psych Involvement: No (comment)  Admission diagnosis:  Acute respiratory failure with hypoxia (HCC) [J96.01] Bilateral pulmonary infiltrates on chest x-ray [R91.8] Multifocal pneumonia [J18.9] Patient Active Problem List   Diagnosis Date Noted  . Multifocal pneumonia 12/23/2020  . Aspiration pneumonia (HCC) 12/20/2020  . Sepsis (HCC) 12/20/2020  . Recurrent aspiration pneumonia (HCC) 12/16/2020  . Acute respiratory failure with hypoxia (HCC) 11/05/2020  . Hypoxia 11/05/2020  . Pneumonia 10/09/2020  . GERD (gastroesophageal reflux disease)   . Esophageal motility disorder   . Alcohol abuse, in remission 08/30/2020  . Gastroesophageal reflux disease with esophagitis 08/30/2020  . Moderate recurrent major depression (HCC) 08/30/2020  . Preoperative evaluation of a  medical condition to rule out surgical contraindications (TAR required) 08/30/2020  . Anxiety 06/12/2020  .  Benzodiazepine dependence (HCC) 06/12/2020  . History of substance abuse (HCC) 06/12/2020  . Obstructive sleep apnea 06/12/2020  . Hypothyroidism 03/11/2019  . Essential hypertension 02/02/2018  . CHF (congestive heart failure) (HCC) 02/02/2018  . Esophageal dysphagia 02/02/2018  . Esophageal stricture/stenosis/esophageal dysmotility with recurrent aspiration 02/02/2018  . Esophageal stenosis 02/02/2018  . History of hepatitis C 01/17/2013  . Depression 01/17/2013  . Anxiety with depression 01/17/2013   PCP:  Anabel Halon, MD Pharmacy:   [facility] [place], [place] - [address] [place] [address] Phone: [phone] Fax: [phone]     Social Determinants of Health (SDOH) Interventions    Readmission Risk Interventions Readmission Risk Prevention Plan 12/26/2020 11/06/2020 10/11/2020  Transportation Screening Complete Complete Complete  PCP or Specialist Appt within 3-5 Days - - -  HRI or Home Care Consult - - -  Social Work Consult for Recovery Care Planning/Counseling - - -  Palliative Care Screening - - -  Medication Review Oceanographer) Complete Complete Complete  PCP or Specialist appointment within 3-5 days of discharge - - Complete  PCP/Specialist Appt Not Complete comments - - -  HRI or Home Care Consult - Complete Not Complete  HRI or Home Care Consult Pt Refusal Comments - - no barriers  SW Recovery Care/Counseling Consult Complete Complete Not Complete  SW Consult Not Complete Comments - - no barriers  Palliative Care Screening Not Applicable Not Applicable Not Applicable  Skilled Nursing Facility Not Applicable Not Applicable Not Applicable  Some recent data might be hidden     Final next level of care: Home/Self Care Barriers to Discharge: Barriers Resolved   Patient Goals and CMS Choice Patient states their goals for this hospitalization and ongoing recovery are:: go home CMS Medicare.gov Compare Post Acute Care list  provided to:: Patient Represenative (must comment) Choice offered to / list presented to : Spouse  Discharge Placement                       Discharge Plan and Services In-house Referral: Clinical Social Work   Post Acute Care Choice: Durable Medical Equipment          DME Arranged: Oxygen DME Agency: Patsy Lager Date DME Agency Contacted: 12/26/20   Representative spoke with at DME Agency: Morrie Sheldon            Social Determinants of Health (SDOH) Interventions     Readmission Risk Interventions Readmission Risk Prevention Plan 12/26/2020 11/06/2020 10/11/2020  Transportation Screening Complete Complete Complete  PCP or Specialist Appt within 3-5 Days - - -  HRI or Home Care Consult - - -  Social Work Consult for Recovery Care Planning/Counseling - - -  Palliative Care Screening - - -  Medication Review Oceanographer) Complete Complete Complete  PCP or Specialist appointment within 3-5 days of discharge - - Complete  PCP/Specialist Appt Not Complete comments - - -  HRI or Home Care Consult - Complete Not Complete  HRI or Home Care Consult Pt Refusal Comments - - no barriers  SW Recovery Care/Counseling Consult Complete Complete Not Complete  SW Consult Not Complete Comments - - no barriers  Palliative Care Screening Not Applicable Not Applicable Not Applicable  Skilled Nursing Facility Not Applicable Not Applicable Not Applicable  Some recent data might be hidden

## 2020-12-26 NOTE — Discharge Instructions (Signed)
Bradley's Neurology in Clinical Practice (8th ed., pp. 152-163). [place], [place]: Elsevier."> [employer] Textbook of Surgery (21st ed., pp. 1056-1078). [place], [place]: [employer].">  Dysphagia  Dysphagia is trouble swallowing. This condition occurs when solids and liquids stick in a person's throat on the way down to the stomach, or when food takes longer to get to the stomach than usual. You may have problems swallowing food, liquids, or both. You may also have pain while trying to swallow. It may take you more time and effort to swallow something. What are the causes? This condition may be caused by:  Muscle problems. These may make it difficult for you to move food and liquids through the esophagus, which is the tube that connects your mouth to your stomach.  Blockages. You may have ulcers, scar tissue, or inflammation that blocks the normal passage of food and liquids. Causes of these problems include: ? Acid reflux from your stomach into your esophagus (gastroesophageal reflux). ? Infections. ? Radiation treatment for cancer. ? Medicines taken without enough fluids to wash them down into your stomach.  Stroke. This can affect the nerves and make it difficult to swallow.  Nerve problems. These prevent signals from being sent to the muscles of your esophagus to squeeze (contract) and move what you swallow down to your stomach.  Globus pharyngeus. This is a common problem that involves a feeling like something is stuck in your throat or a sense of trouble with swallowing, even though nothing is wrong with the swallowing passages.  Certain conditions, such as cerebral palsy or Parkinson's disease. What are the signs or symptoms? Common symptoms of this condition include:  A feeling that solids or liquids are stuck in your throat on the way down to the stomach.  Pain while swallowing.  Coughing or gagging while trying to swallow. Other symptoms include:  Food moving back from  your stomach to your mouth (regurgitation).  Noises coming from your throat.  Chest discomfort when swallowing.  A feeling of fullness when swallowing.  Drooling, especially when the throat is blocked.  Heartburn. How is this diagnosed? This condition may be diagnosed by:  Barium swallow X-Nordahl. In this test, you will swallow a white liquid that sticks to the inside of your esophagus. X-Suminski images are then taken.  Endoscopy. In this test, a flexible telescope is inserted down your throat to look at your esophagus and your stomach.  CT scans or an MRI. How is this treated? Treatment for dysphagia depends on the cause of this condition:  If the dysphagia is caused by acid reflux or infection, medicines may be used. These may include antibiotics or heartburn medicines.  If the dysphagia is caused by problems with the muscles, swallowing therapy may be used to help you strengthen your swallowing muscles. You may have to do specific exercises to strengthen the muscles or stretch them.  If the dysphagia is caused by a blockage or mass, procedures to remove the blockage may be done. You may need surgery and a feeding tube. You may need to make diet changes. Ask your health care provider for specific instructions. Follow these instructions at home: Medicines  Take over-the-counter and prescription medicines only as told by your health care provider.  If you were prescribed an antibiotic medicine, take it as told by your health care provider. Do not stop taking the antibiotic even if you start to feel better. Eating and drinking  Make any diet changes as told by your health care provider.    Work with a diet and nutrition specialist (dietitian) to create an eating plan that will help you get the nutrients you need in order to stay healthy.  Eat soft foods that are easier to swallow.  Cut your food into small pieces and eat slowly. Take small bites.  Eat and drink only when you are  sitting upright.  Do not drink alcohol or caffeine. If you need help quitting, ask your health care provider.   General instructions  Check your weight every day to make sure you are not losing weight.  Do not use any products that contain nicotine or tobacco. These products include cigarettes, chewing tobacco, and vaping devices, such as e-cigarettes. If you need help quitting, ask your health care provider.  Keep all follow-up visits. This is important. Contact a health care provider if:  You lose weight because you cannot swallow.  You cough when you drink liquids.  You cough up partially digested food. Get help right away if:  You cannot swallow your saliva.  You have shortness of breath, a fever, or both.  Your voice is hoarse and you have trouble swallowing. These symptoms may represent a serious problem that is an emergency. Do not wait to see if the symptoms will go away. Get medical help right away. Call your local emergency services (911 in the [place] Do not drive yourself to the hospital. Summary  Dysphagia is trouble swallowing. This condition occurs when solids and liquids stick in a person's throat on the way down to the stomach. You may cough or gag while trying to swallow.  Dysphagia has many possible causes.  Treatment for dysphagia depends on the cause of the condition.  Keep all follow-up visits. This is important. This information is not intended to replace advice given to you by your health care provider. Make sure you discuss any questions you have with your health care provider. Document Revised: 03/23/2020 Document Reviewed: 03/23/2020 Elsevier Patient Education  2021 Elsevier Inc.    Full Liquid Diet A full liquid diet refers to fluids and foods that are liquid, or will become liquid, at room temperature. This diet should only be used for a short period of time to help you recover from illness or surgery. Your health care provider or dietitian will  help determine when it is safe to eat regular foods again. What are tips for following this plan? Reading food labels  Check food labels of nutrition shakes for the amount of protein. Look for nutrition shakes that have at least 8-10 grams of protein in each serving.  Choose drinks, such as milks and juices, that are "fortified" or "enriched." This means that vitamins and minerals have been added. Shopping  Buy pre-made nutrition shakes to keep on hand.  To vary your choices, buy different flavors of milks and shakes. Meal planning  Choose flavors and foods that you enjoy.  To make sure you get enough energy and calories from food: ? Have three full liquid meals each day. Have a liquid snack between each meal. ? Drink 6-8 oz (177-237 mL) of a nutritional supplement shake with meals or as snacks. ? Add protein powder, powdered milk, milk, or yogurt to shakes to increase the amount of protein.  Drink at least one serving a day of citrus fruit juice or fruit juice that has vitamin C added. General guidelines  Before starting the full liquid diet, check with your health care provider to know what foods you should avoid. These may include full-fat  or high-fiber liquids.  You may have any liquid or food that becomes a liquid at room temperature. The food is considered a liquid if it can be poured off a spoon at room temperature.  Do not drink alcohol unless approved by your health care provider.  This diet gives you most of the nutrients that you need for energy, but you may not get enough of certain vitamins, minerals, and fiber. Make sure to talk to your health care provider or dietitian about: ? How many calories you need to eat each day. ? How much fluid you should have each day. ? Taking a multivitamin or a nutritional supplement. What foods should I eat? Fruits Fruit juice without pulp. Strained fruit pures (seeds and skins removed). Vegetables Pulp-free tomato or vegetable  juice. Vegetables pured in soup. Grains Thin, hot cereal, such as farina. Soft-cooked pasta or rice pured in soup. Meats and other proteins Beef, chicken, and fish broths. Powdered protein supplements. Dairy Milk and milk-based beverages, including milk shakes and instant breakfast mixes. Smooth yogurt. Pured cottage cheese. Fats and oils Melted margarine and butter. Cream. Canola, almond, avocado, corn, grapeseed, sunflower, and sesame oils. Gravy. Beverages Water. Coffee and tea (caffeinated or decaffeinated). Cocoa. Liquid nutritional supplements. Soft drinks. Nondairy milks, such as almond, coconut, rice, or soy milk. Sweets and desserts Custard. Pudding. Flavored gelatin. Smooth ice cream (without nuts or candy pieces). Sherbet. Frozen ice pops. [place] ice. Pudding pops. Seasonings and condiments Salt and pepper. Spices. Vinegar. Ketchup. Yellow mustard. Smooth sauces, such as [place], cheese sauce, or white sauce. Soy sauce. Syrup. Honey. Jelly (without fruit pieces). Other foods Cocoa powder. Cream soups. Strained soups. The items listed above may not be a complete list of foods and beverages you can eat. Contact a dietitian for more information.   What foods should I avoid? Fruits All whole fresh, frozen, or canned fruits. Vegetables All whole fresh, frozen, or canned vegetables. Grains Whole grains. Pasta. Rice. Cold cereal. Bread. Crackers. Meats and other proteins All cuts of meat, poultry, and fish. Eggs. Tofu and soy protein. Nuts and nut butters. Precooked or cured meat, such as sausages or meat loaves. Dairy Hard cheese. Yogurt with fruit chunks. Fats and oils Coconut oil. Palm oil. Lard. Cold butter. Sweets and desserts Ice cream or other frozen desserts that contain solids, such as nuts, chocolate chips, and pieces of cookies. Cakes. Cookies. Candy. Seasonings and condiments Stone-ground mustard. Other foods Soups with chunks or pieces. The items listed  above may not be a complete list of foods and beverages you should avoid. Contact a dietitian for more information. Summary  A full liquid diet refers to fluids and foods that are liquid or will become liquid at room temperature.  This diet should only be used for a short period of time to help you recover from illness or surgery. Ask your health care provider or dietitian when it is safe for you to eat regular foods.  To make sure you get enough calories and nutrients, eat three meals each day with snacks in between. Drink pre-made nutritional supplement shakes or add protein powder to homemade shakes. Talk to your health care provider about taking a vitamin and mineral supplement. This information is not intended to replace advice given to you by your health care provider. Make sure you discuss any questions you have with your health care provider. Document Revised: 05/21/2020 Document Reviewed: 05/21/2020 Elsevier Patient Education  2021 Elsevier Inc.   IMPORTANT INFORMATION: PAY CLOSE ATTENTION  PHYSICIAN DISCHARGE INSTRUCTIONS  Follow with Primary care provider  Anabel Halon, MD  and other consultants as instructed by your Hospitalist Physician  SEEK MEDICAL CARE OR RETURN TO EMERGENCY ROOM IF SYMPTOMS COME BACK, WORSEN OR NEW PROBLEM DEVELOPS   Please note: You were cared for by a hospitalist during your hospital stay. Every effort will be made to forward records to your primary care provider.  You can request that your primary care provider send for your hospital records if they have not received them.  Once you are discharged, your primary care physician will handle any further medical issues. Please note that NO REFILLS for any discharge medications will be authorized once you are discharged, as it is imperative that you return to your primary care physician (or establish a relationship with a primary care physician if you do not have one) for your post hospital discharge needs so  that they can reassess your need for medications and monitor your lab values.  Please get a complete blood count and chemistry panel checked by your Primary MD at your next visit, and again as instructed by your Primary MD.  Get Medicines reviewed and adjusted: Please take all your medications with you for your next visit with your Primary MD  Laboratory/radiological data: Please request your Primary MD to go over all hospital tests and procedure/radiological results at the follow up, please ask your primary care provider to get all Hospital records sent to his/her office.  In some cases, they will be blood work, cultures and biopsy results pending at the time of your discharge. Please request that your primary care provider follow up on these results.  If you are diabetic, please bring your blood sugar readings with you to your follow up appointment with primary care.    Please call and make your follow up appointments as soon as possible.    Also Note the following: If you experience worsening of your admission symptoms, develop shortness of breath, life threatening emergency, suicidal or homicidal thoughts you must seek medical attention immediately by calling 911 or calling your MD immediately  if symptoms less severe.  You must read complete instructions/literature along with all the possible adverse reactions/side effects for all the Medicines you take and that have been prescribed to you. Take any new Medicines after you have completely understood and accpet all the possible adverse reactions/side effects.   Do not drive when taking Pain medications or sleeping medications (Benzodiazepines)  Do not take more than prescribed Pain, Sleep and Anxiety Medications. It is not advisable to combine anxiety,sleep and pain medications without talking with your primary care practitioner  Special Instructions: If you have smoked or chewed Tobacco  in the last 2 yrs please stop smoking, stop any  regular Alcohol  and or any Recreational drug use.  Wear Seat belts while driving.  Do not drive if taking any narcotic, mind altering or controlled substances or recreational drugs or alcohol.

## 2020-12-26 NOTE — Plan of Care (Signed)

## 2020-12-26 NOTE — Progress Notes (Signed)
Pt IV infiltrated during previous shift, IV placed with ultrasound guidance.  Pt refused to have another IV placed at this time.

## 2020-12-27 ENCOUNTER — Telehealth: Payer: Self-pay

## 2020-12-27 NOTE — Telephone Encounter (Signed)
Pt at ER at [facility].

## 2020-12-28 LAB — CULTURE, BLOOD (ROUTINE X 2)
Culture: NO GROWTH
Culture: NO GROWTH
Special Requests: ADEQUATE
Special Requests: ADEQUATE

## 2020-12-30 ENCOUNTER — Other Ambulatory Visit: Payer: Self-pay

## 2020-12-30 DIAGNOSIS — J189 Pneumonia, unspecified organism: Secondary | ICD-10-CM

## 2020-12-31 ENCOUNTER — Telehealth: Payer: Self-pay

## 2020-12-31 ENCOUNTER — Other Ambulatory Visit: Payer: Self-pay | Admitting: *Deleted

## 2020-12-31 NOTE — Patient Outreach (Addendum)
[facility]) Care Management  12/31/2020  Eliot Popper Seevers December 05, 1960 [id number]   Referral Date: 5/17 Referral Source: Insurance Referral Reason: Multiple ED visits and hospitalizations Insurance: [facility] attempt #1, unsuccessful.  Male answering phone state member is not available.    Plan: RN CM will send outreach letter and follow up within the next 3-4 business days.    Update:  Noted that member is active with [facility], PCP is Dr. Allena Katz.  Patient is being transitioned to Chronic Care Management with the primary provider office, case closed.    Kemper Durie, [place], MSN [facility] Care Management  [facility] Manager [phone]

## 2020-12-31 NOTE — Telephone Encounter (Signed)
Transition Care Management Unsuccessful Follow-up Telephone Call  Date of discharge and from where:  12/30/2020 from [facility]   Attempts:  1st Attempt  Reason for unsuccessful TCM follow-up call:  No answer/busy

## 2021-01-01 ENCOUNTER — Telehealth: Payer: Self-pay | Admitting: *Deleted

## 2021-01-01 NOTE — Chronic Care Management (AMB) (Signed)
  Care Management   Outreach Note  01/01/2021 Name: Christian Ellison MRN: [id number] DOB: Sep 11, 1960  Referred by: Anabel Halon, MD Reason for referral : Care Coordination (Outreach to schedule initial call with RNCM was unsuccessful )   An unsuccessful telephone outreach was attempted today. The patient was referred to the case management team for assistance with care management and care coordination.   Follow Up Plan: The care management team will reach out to the patient again over the next 7 days. If patient returns call to provider office, please advise to call Embedded Care Management Care Guide Gwenevere Ghazi at [phone]  Care Guide, Embedded Care Coordination [facility]  Care Management

## 2021-01-03 NOTE — Chronic Care Management (AMB) (Signed)
  Care Management   Outreach Note  01/03/2021 Name: Christian Ellison MRN: [id number] DOB: [birth year]  Referred by: Anabel Halon, MD Reason for referral : Care Coordination (Initial outreach to schedule referral with RNCM was unsuccessful, second outreach to schedule referral with RNCM was unsuccessful /)   A second unsuccessful telephone outreach was attempted today. The patient was referred to the case management team for assistance with care management and care coordination.   Follow Up Plan: The care management team will reach out to the patient again over the next 7 days. If patient returns call to provider office, please advise to call Embedded Care Management Care Guide Gwenevere Ghazi at [phone].  Gwenevere Ghazi  Care Guide, Embedded Care Coordination [facility] Management

## 2021-01-06 ENCOUNTER — Inpatient Hospital Stay: Payer: 59 | Admitting: Internal Medicine

## 2021-01-06 ENCOUNTER — Ambulatory Visit: Payer: 59 | Admitting: *Deleted

## 2021-01-08 NOTE — Chronic Care Management (AMB) (Signed)
  Care Management   Outreach Note  01/08/2021 Name: Christian Ellison MRN: [id number] DOB: 1961/05/03  Referred by: Anabel Halon, MD Reason for referral : Care Coordination (Initial outreach to schedule referral with RNCM was unsuccessful, second outreach to schedule referral with RNCM was unsuccessful. Third outreach to schedule referral with RNCM was unsuccessful/)   Third unsuccessful telephone outreach was attempted today. The patient was referred to the case management team for assistance with care management and care coordination. The patient's primary care provider has been notified of our unsuccessful attempts to make or maintain contact with the patient. The care management team is pleased to engage with this patient at any time in the future should he/she be interested in assistance from the care management team.   Follow Up Plan: A HIPAA compliant phone message was left for the patient providing contact information and requesting a return call. If patient returns call to provider office, please advise to call Embedded Care Management Care Guide Gwenevere Ghazi at [phone].  Gwenevere Ghazi  Care Guide, Embedded Care Coordination [facility] Management

## 2021-01-11 ENCOUNTER — Encounter: Payer: Self-pay | Admitting: Internal Medicine

## 2021-01-16 ENCOUNTER — Other Ambulatory Visit: Payer: Self-pay | Admitting: *Deleted

## 2021-01-16 ENCOUNTER — Ambulatory Visit (INDEPENDENT_AMBULATORY_CARE_PROVIDER_SITE_OTHER): Payer: 59 | Admitting: Internal Medicine

## 2021-01-16 ENCOUNTER — Encounter: Payer: Self-pay | Admitting: Internal Medicine

## 2021-01-16 ENCOUNTER — Other Ambulatory Visit: Payer: Self-pay

## 2021-01-16 VITALS — BP 148/92 | HR 64 | Temp 99.0°F | Resp 20 | Ht 72.0 in | Wt 196.4 lb

## 2021-01-16 DIAGNOSIS — F418 Other specified anxiety disorders: Secondary | ICD-10-CM | POA: Diagnosis not present

## 2021-01-16 DIAGNOSIS — Z09 Encounter for follow-up examination after completed treatment for conditions other than malignant neoplasm: Secondary | ICD-10-CM

## 2021-01-16 DIAGNOSIS — J302 Other seasonal allergic rhinitis: Secondary | ICD-10-CM

## 2021-01-16 DIAGNOSIS — I10 Essential (primary) hypertension: Secondary | ICD-10-CM

## 2021-01-16 DIAGNOSIS — J189 Pneumonia, unspecified organism: Secondary | ICD-10-CM

## 2021-01-16 DIAGNOSIS — K222 Esophageal obstruction: Secondary | ICD-10-CM | POA: Diagnosis not present

## 2021-01-16 MED ORDER — FLUTICASONE PROPIONATE 50 MCG/ACT NA SUSP: 2.0000 | Freq: Every day | NASAL | 6 refills | Status: DC

## 2021-01-16 MED ORDER — ALBUTEROL SULFATE HFA 108 (90 BASE) MCG/ACT IN AERS: 2.0000 | INHALATION_SPRAY | Freq: Four times a day (QID) | RESPIRATORY_TRACT | 2 refills | Status: DC | PRN

## 2021-01-16 MED ORDER — PANTOPRAZOLE SODIUM 40 MG PO TBEC: 40.0000 mg | DELAYED_RELEASE_TABLET | Freq: Two times a day (BID) | ORAL | 5 refills | Status: DC

## 2021-01-16 NOTE — Assessment & Plan Note (Signed)
On Xanax 2 mg qHS PRN Advised to discuss with Dr Evelene Croon about titrating Xanax down as GI recommends avoiding Benzos

## 2021-01-16 NOTE — Assessment & Plan Note (Signed)
Now better since botox injection Has follow up appointment with Dr Rhona Raider (Thoracic surgeon) F/u with GI DC Diltiazem as per GI recs Continue Pantoprazole

## 2021-01-16 NOTE — Assessment & Plan Note (Signed)
Hospital chart reviewed including discharge summary Admitted for recurrent aspiration pneumonia due to esophageal stenosis/dysmotility Had EGD and botox injection Fluconazole for candidal esophagitis Now better

## 2021-01-16 NOTE — Assessment & Plan Note (Signed)
BP still elevated States he has been anxious recently due to procedure Will monitor for now, if persistent, will start ARB

## 2021-01-16 NOTE — Patient Instructions (Signed)
Please continue taking medications as prescribed.  Please continue to follow up with GI and Thoracic surgery as scheduled.  Please use Flonase for allergies.

## 2021-01-16 NOTE — Progress Notes (Signed)
Established Patient Office Visit  Subjective:  Patient ID: Christian Ellison, male    DOB: 04/21/1961  Age: 60 y.o. MRN: [id number]  CC:  Chief Complaint  Patient presents with  . Transitions Of Care    Pt was at [facility] then transferred to [facility] discharged 12-31-20 has been better since being home no aspiration and feels like botox has worked     HPI Christian Ellison is a 60 y.o.Mwith PMH of esophageal dysmotlity and recurrentaspirationpneumonia, benzodiazepine dependence,opiate use disorder in relapse,hep C s/p treatment, hypothyroidism, recent left talus/calcaneous fracture and ORIF, OSA on CPAP and anxiety who presents for follow up after recent hospitalization.  Patient was admitted to [facility] for recurrent aspiration pneumonia 2/2 to esophageal dysmotility. He had EGD with botox injection, which has helped him significantly. He denies fever, chills, fatigue, odynophagia, dyspnea or wheezing currently. He follows up with Thoracic surgeon for esophageal dysphagia now. He has completed Fluconazole for candidal esophagitis.  His BP was elevated, but he states that he has been feeling anxious since the procedure. His wife's daughter is pregnant and due soon, which also contributes to his anxiety. He denies any headache, dizziness, chest pain, dyspnea or palpitations currently.  Past Medical History:  Diagnosis Date  . Anxiety   . Aspiration pneumonia (HCC) 05/26/2018   RLL  . Chronic pain   . Closed fracture of xiphoid process   . Esophageal stricture   . GERD (gastroesophageal reflux disease)   . 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   Marland Kitchen Opiate abuse, continuous (HCC)   . Pulmonary edema 02/02/2018  . Sepsis due to pneumonia (HCC) 03/11/2019  . Sleep apnea    CPAP nightly    Past Surgical History:  Procedure Laterality Date  . BIOPSY  03/18/2018   Procedure: BIOPSY;  Surgeon: Malissa Hippo, MD;  [facility]: [facility];  Service:  Endoscopy;;  gastric   . COLONOSCOPY N/A 11/22/2013   Dr. Karilyn Cota: Normal except for hemorrhoids.  Next colonoscopy 10 years.  . ESOPHAGEAL DILATION N/A 03/18/2018   Procedure: ESOPHAGEAL DILATION;  Surgeon: Malissa Hippo, MD;  Location: [facility];  Service: Endoscopy;  Laterality: N/A;  . ESOPHAGEAL DILATION N/A 08/07/2020   Procedure: ESOPHAGEAL DILATION;  Surgeon: Malissa Hippo, MD;  Location: [facility];  Service: Endoscopy;  Laterality: N/A;  . ESOPHAGEAL MANOMETRY N/A 06/15/2018   Procedure: ESOPHAGEAL MANOMETRY (EM);  Surgeon: Napoleon Form, MD;  Location: [facility];  Service: Endoscopy;  Laterality: N/A;  . ESOPHAGOGASTRODUODENOSCOPY (EGD) WITH PROPOFOL N/A 03/18/2018   Dr. Karilyn Cota: Benign-appearing mild esophageal stenosis proximal to the GE J, status post dilation.  2 cm hiatal hernia.  Gastritis, biopsies negative for H. pylori  . ESOPHAGOGASTRODUODENOSCOPY (EGD) WITH PROPOFOL N/A 08/07/2020   Procedure: ESOPHAGOGASTRODUODENOSCOPY (EGD) WITH PROPOFOL;  Surgeon: Malissa Hippo, MD;  [facility];  Service: Endoscopy;  Laterality: N/A;  . Fracture Right Leg     Patient has rod/screws in this leg  . ORIF CALCANEOUS FRACTURE Left 07/04/2020   Procedure: OPEN REDUCTION INTERNAL FIXATION (ORIF) LEFT TALUS AND CALCANEOUS FRACTURE, SUBTALAR JOINT ARTHROTOMY WITH REMOVAL OF LOOSE BODIES, TALONAVICULAR JOINT ARTHOTOMY WITH REMOVAL OF LOOSE BODIES;  Surgeon: Terance Hart, MD;  Location: [facility] OR;  Service: Orthopedics;  Laterality: Left;  . Rotor Cuff  2012   Right Shoulder    Family History  Problem Relation Age of Onset  . Breast cancer Mother   . Kidney  failure Mother   . Stroke Mother   . Colon cancer Father   . Bladder Cancer Father   . Prostate cancer Father   . Hypertension Sister   . Hypothyroidism Sister   . Healthy Sister   . Healthy Daughter   . Healthy Son   . Healthy Son     Social History   Socioeconomic History  . Marital status:  Married    Spouse name: Not on file  . Number of children: Not on file  . Years of education: Not on file  . Highest education level: Not on file  Occupational History  . Occupation: unemployed  Tobacco Use  . Smoking status: Never Smoker  . Smokeless tobacco: Never Used  Vaping Use  . Vaping Use: Never used  Substance and Sexual Activity  . Alcohol use: Not Currently  . Drug use: Not Currently    Types: Benzodiazepines    Comment: opiates  . Sexual activity: Not on file  Other Topics Concern  . Not on file  Social History Narrative  . Not on file   Social Determinants of Health   Financial Resource Strain: Not on file  Food Insecurity: Not on file  Transportation Needs: Not on file  Physical Activity: Not on file  Stress: Not on file  Social Connections: Not on file  Intimate Partner Violence: Not on file    Outpatient Medications Prior to Visit  Medication Sig Dispense Refill  . acetaminophen (TYLENOL) 325 MG tablet Take 2 tablets (650 mg total) by mouth every 6 (six) hours as needed for mild pain (or Fever >/= 101). 30 tablet 1  . albuterol (PROVENTIL) (2.5 MG/3ML) 0.083% nebulizer solution Take 3 mLs (2.5 mg total) by nebulization every 4 (four) hours as needed for wheezing. 75 mL 2  . ALPRAZolam (XANAX) 1 MG tablet Take 2 mg by mouth at bedtime as needed for anxiety.    Marland Kitchen aspirin EC 81 MG tablet Take 1 tablet (81 mg total) by mouth at bedtime.    . Cimetidine (TAGAMET PO) Take 75 mg by mouth daily as needed (if he feels his food coming up after meal).    Marland Kitchen levothyroxine (SYNTHROID) 25 MCG tablet TAKE 1 TABLET BY MOUTH EVERY DAY (Patient taking differently: Take 25 mcg by mouth daily before breakfast.) 90 tablet 1  . Multiple Vitamin (MULTIVITAMIN WITH MINERALS) TABS tablet Take 1 tablet by mouth daily. 50+    . albuterol (VENTOLIN HFA) 108 (90 Base) MCG/ACT inhaler Inhale 2 puffs into the lungs every 6 (six) hours as needed for wheezing or shortness of breath. 8 g 2   . pantoprazole (PROTONIX) 40 MG tablet Take 1 tablet (40 mg total) by mouth 2 (two) times daily. 60 tablet 5  . diltiazem (DILACOR XR) 120 MG 24 hr capsule Take 1 capsule (120 mg total) by mouth daily. (Patient not taking: Reported on 01/16/2021) 30 capsule 3   No facility-administered medications prior to visit.    No Known Allergies  ROS Review of Systems  Constitutional: Positive for fatigue. Negative for chills and fever.  HENT: Negative for congestion and sore throat.   Eyes: Negative for pain and discharge.  Respiratory: Negative for cough and shortness of breath.   Cardiovascular: Negative for chest pain and palpitations.  Gastrointestinal: Negative for constipation, diarrhea, nausea and vomiting.  Endocrine: Negative for polydipsia and polyuria.  Genitourinary: Negative for dysuria and hematuria.  Musculoskeletal: Negative for neck pain and neck stiffness.  Skin: Negative for rash.  Neurological: Negative for dizziness, weakness, numbness and headaches.  Psychiatric/Behavioral: Negative for agitation and behavioral problems.      Objective:    Physical Exam Vitals reviewed.  Constitutional:      General: He is not in acute distress.    Appearance: He is not diaphoretic.  HENT:     Head: Normocephalic and atraumatic.     Nose: Nose normal.     Mouth/Throat:     Mouth: Mucous membranes are moist.  Eyes:     General: No scleral icterus.    Extraocular Movements: Extraocular movements intact.  Cardiovascular:     Rate and Rhythm: Normal rate and regular rhythm.     Pulses: Normal pulses.     Heart sounds: Normal heart sounds. No murmur heard.   Pulmonary:     Breath sounds: Normal breath sounds. No wheezing or rales.  Abdominal:     Palpations: Abdomen is soft.     Tenderness: There is no abdominal tenderness.  Musculoskeletal:     Cervical back: Neck supple. No tenderness.     Right lower leg: No edema.     Left lower leg: No edema.  Skin:    General: Skin  is warm.     Findings: No rash.  Neurological:     General: No focal deficit present.     Mental Status: He is alert and oriented to person, place, and time.  Psychiatric:        Mood and Affect: Mood normal.        Behavior: Behavior normal.     BP (!) 148/92 (BP Location: Left Arm, Patient Position: Sitting, Cuff Size: Normal)   Pulse 64   Temp 99 F (37.2 C) (Oral)   Resp 20   Ht 6' (1.829 m)   Wt 196 lb 6.4 oz (89.1 kg)   SpO2 100%   BMI 26.64 kg/m  Wt Readings from Last 3 Encounters:  01/16/21 196 lb 6.4 oz (89.1 kg)  12/23/20 195 lb 12.3 oz (88.8 kg)  12/20/20 209 lb 7 oz (95 kg)     Health Maintenance Due  Topic Date Due  . Zoster Vaccines- Shingrix (1 of 2) Never done  . COVID-19 Vaccine (3 - Booster for Moderna series) 05/20/2020    There are no preventive care reminders to display for this patient.  Lab Results  Component Value Date   TSH 1.609 12/23/2020   Lab Results  Component Value Date   WBC 6.4 12/24/2020   HGB 11.3 (L) 12/24/2020   HCT 36.3 (L) 12/24/2020   MCV 93.8 12/24/2020   PLT 175 12/24/2020   Lab Results  Component Value Date   NA 139 12/24/2020   K 3.8 12/24/2020   CO2 29 12/24/2020   GLUCOSE 121 (H) 12/24/2020   BUN 12 12/24/2020   CREATININE 0.64 12/24/2020   BILITOT 0.6 12/24/2020   ALKPHOS 54 12/24/2020   AST 10 (L) 12/24/2020   ALT 10 12/24/2020   PROT 6.8 12/24/2020   ALBUMIN 3.1 (L) 12/24/2020   CALCIUM 8.3 (L) 12/24/2020   ANIONGAP 8 12/24/2020   No results found for: CHOL No results found for: HDL No results found for: [facility] Lab Results  Component Value Date   TRIG 95 01/02/2018   No results found for: [facility] Lab Results  Component Value Date   HGBA1C 5.3 08/30/2020      Assessment & Plan:   Problem List Items Addressed This Visit      Cardiovascular and Mediastinum  Essential hypertension    BP still elevated States he has been anxious recently due to procedure Will monitor for now, if  persistent, will start ARB        Digestive   Esophageal stricture/stenosis/esophageal dysmotility with recurrent aspiration    Now better since botox injection Has follow up appointment with Dr Rhona Raider (Thoracic surgeon) F/u with GI DC Diltiazem as per GI recs Continue Pantoprazole         Other   Anxiety with depression    On Xanax 2 mg qHS PRN Advised to discuss with Dr Evelene Croon about titrating Xanax down as GI recommends avoiding Benzos      Seasonal allergies    Flonase started      Relevant Medications   fluticasone (FLONASE) 50 MCG/ACT nasal spray   Hospital discharge follow-up [facility] chart reviewed including discharge summary Admitted for recurrent aspiration pneumonia due to esophageal stenosis/dysmotility Had EGD and botox injection Fluconazole for candidal esophagitis Now better         Meds ordered this encounter  Medications  . fluticasone (FLONASE) 50 MCG/ACT nasal spray    Sig: Place 2 sprays into both nostrils daily.    Dispense:  16 g    Refill:  6    Follow-up: Return in about 3 months (around 04/18/2021) for HTN and esophageal dysmotility.    Anabel Halon, MD

## 2021-01-16 NOTE — Assessment & Plan Note (Signed)
Flonase started

## 2021-01-19 ENCOUNTER — Telehealth (INDEPENDENT_AMBULATORY_CARE_PROVIDER_SITE_OTHER): Payer: Self-pay | Admitting: Internal Medicine

## 2021-01-19 NOTE — Telephone Encounter (Signed)
Message palpation appreciated. He has not Botox in the procedure at [facility] on 12/30/2020. He is doing much better. Patient called.  Message left to keep [place] posted if he is going back for myotomy.

## 2021-01-27 ENCOUNTER — Other Ambulatory Visit: Payer: Self-pay | Admitting: *Deleted

## 2021-01-27 DIAGNOSIS — J189 Pneumonia, unspecified organism: Secondary | ICD-10-CM

## 2021-01-27 MED ORDER — ALBUTEROL SULFATE HFA 108 (90 BASE) MCG/ACT IN AERS: 2.0000 | INHALATION_SPRAY | Freq: Four times a day (QID) | RESPIRATORY_TRACT | 2 refills | Status: DC | PRN

## 2021-02-18 ENCOUNTER — Other Ambulatory Visit: Payer: 59

## 2021-02-24 ENCOUNTER — Telehealth: Payer: Self-pay

## 2021-02-24 ENCOUNTER — Telehealth (INDEPENDENT_AMBULATORY_CARE_PROVIDER_SITE_OTHER): Payer: Self-pay | Admitting: *Deleted

## 2021-02-24 NOTE — Telephone Encounter (Signed)
Patient L/M on my VM, wants you to know he has had a very successful surgery and he is home from [place]. He wanted you to know how much he appreciates all you have done for him.

## 2021-02-24 NOTE — Telephone Encounter (Signed)
Transition Care Management Follow-up Telephone Call Date of discharge and from where: 02/21/2021 from [facility]  How have you been since you were released from the hospital? Pt is doing well since surgery.  Any questions or concerns? No  Items Reviewed: Did the pt receive and understand the discharge instructions provided? Yes  Medications obtained and verified? Yes  Other? Yes  Any new allergies since your discharge? No  Dietary orders reviewed? Yes Do you have support at home? Yes   Home Care and Equipment/Supplies: Were home health services ordered? no If so, what is the name of the agency? NA  Has the agency set up a time to come to the patient's home? no Were any new equipment or medical supplies ordered?  No What is the name of the medical supply agency? NA Were you able to get the supplies/equipment? not applicable Do you have any questions related to the use of the equipment or supplies? No  Functional Questionnaire: (I = Independent and D = Dependent) ADLs: I  Bathing/Dressing- I  Meal Prep- I  Eating- I  Maintaining continence- I  Transferring/Ambulation- I  Managing Meds- I  Follow up appointments reviewed:  PCP Hospital f/u appt confirmed? Yes  Scheduled to see Syliva Overman, MD on 03/11/21 @ 1:20pm. Specialist Hospital f/u appt confirmed? No   Are transportation arrangements needed? No  If their condition worsens, is the pt aware to call PCP or go to the Emergency Dept.? Yes Was the patient provided with contact information for the PCP's office or ED? Yes Was to pt encouraged to call back with questions or concerns? Yes

## 2021-03-11 ENCOUNTER — Ambulatory Visit: Payer: Medicare Other | Admitting: Family Medicine

## 2021-03-13 ENCOUNTER — Other Ambulatory Visit: Payer: Self-pay | Admitting: Nurse Practitioner

## 2021-03-13 ENCOUNTER — Ambulatory Visit (HOSPITAL_COMMUNITY)
Admission: RE | Admit: 2021-03-13 | Discharge: 2021-03-13 | Disposition: A | Discharge status: Home / Self Care | Payer: Medicare Other | Source: Ambulatory Visit | Attending: Nurse Practitioner | Admitting: Nurse Practitioner

## 2021-03-13 ENCOUNTER — Encounter: Payer: Self-pay | Admitting: Nurse Practitioner

## 2021-03-13 ENCOUNTER — Other Ambulatory Visit: Payer: Self-pay

## 2021-03-13 ENCOUNTER — Ambulatory Visit (INDEPENDENT_AMBULATORY_CARE_PROVIDER_SITE_OTHER): Payer: Medicare Other | Admitting: Nurse Practitioner

## 2021-03-13 ENCOUNTER — Telehealth: Payer: Self-pay

## 2021-03-13 ENCOUNTER — Other Ambulatory Visit (HOSPITAL_COMMUNITY)
Admission: RE | Admit: 2021-03-13 | Discharge: 2021-03-13 | Disposition: A | Discharge status: Home / Self Care | Payer: Medicare Other | Source: Ambulatory Visit | Attending: Nurse Practitioner | Admitting: Nurse Practitioner

## 2021-03-13 VITALS — BP 182/115 | HR 85 | Temp 97.3°F | Ht 72.0 in | Wt 200.0 lb

## 2021-03-13 DIAGNOSIS — G4700 Insomnia, unspecified: Secondary | ICD-10-CM | POA: Diagnosis not present

## 2021-03-13 DIAGNOSIS — R000 Tachycardia, unspecified: Secondary | ICD-10-CM | POA: Insufficient documentation

## 2021-03-13 DIAGNOSIS — K5909 Other constipation: Secondary | ICD-10-CM

## 2021-03-13 DIAGNOSIS — F1320 Sedative, hypnotic or anxiolytic dependence, uncomplicated: Secondary | ICD-10-CM | POA: Insufficient documentation

## 2021-03-13 DIAGNOSIS — F4310 Post-traumatic stress disorder, unspecified: Secondary | ICD-10-CM | POA: Diagnosis not present

## 2021-03-13 LAB — CBC WITH DIFFERENTIAL/PLATELET
Abs Immature Granulocytes: 0.01 10*3/uL (ref 0.00–0.07)
Basophils Absolute: 0 10*3/uL (ref 0.0–0.1)
Basophils Relative: 1 %
Eosinophils Absolute: 0 10*3/uL (ref 0.0–0.5)
Eosinophils Relative: 1 %
HCT: 45 % (ref 39.0–52.0)
Hemoglobin: 14.8 g/dL (ref 13.0–17.0)
Immature Granulocytes: 0 %
Lymphocytes Relative: 25 %
Lymphs Abs: 1.8 10*3/uL (ref 0.7–4.0)
MCH: 28.5 pg (ref 26.0–34.0)
MCHC: 32.9 g/dL (ref 30.0–36.0)
MCV: 86.7 fL (ref 80.0–100.0)
Monocytes Absolute: 0.8 10*3/uL (ref 0.1–1.0)
Monocytes Relative: 11 %
Neutro Abs: 4.7 10*3/uL (ref 1.7–7.7)
Neutrophils Relative %: 62 %
Platelets: 251 10*3/uL (ref 150–400)
RBC: 5.19 MIL/uL (ref 4.22–5.81)
RDW: 13.7 % (ref 11.5–15.5)
WBC: 7.4 10*3/uL (ref 4.0–10.5)
nRBC: 0 % (ref 0.0–0.2)

## 2021-03-13 LAB — COMPREHENSIVE METABOLIC PANEL
ALT: 26 U/L (ref 0–44)
AST: 23 U/L (ref 15–41)
Albumin: 5.1 g/dL — ABNORMAL HIGH (ref 3.5–5.0)
Alkaline Phosphatase: 65 U/L (ref 38–126)
Anion gap: 11 (ref 5–15)
BUN: 19 mg/dL (ref 6–20)
CO2: 25 mmol/L (ref 22–32)
Calcium: 9.4 mg/dL (ref 8.9–10.3)
Chloride: 100 mmol/L (ref 98–111)
Creatinine, Ser: 0.83 mg/dL (ref 0.61–1.24)
GFR, Estimated: >60 mL/min (ref >60)
Glucose, Bld: 119 mg/dL — ABNORMAL HIGH (ref 70–99)
Potassium: 4.1 mmol/L (ref 3.5–5.1)
Sodium: 136 mmol/L (ref 135–145)
Total Bilirubin: 1.5 mg/dL — ABNORMAL HIGH (ref 0.3–1.2)
Total Protein: 8.9 g/dL — ABNORMAL HIGH (ref 6.5–8.1)

## 2021-03-13 NOTE — Assessment & Plan Note (Addendum)
-  had multiple family members die in the last few years including his son and mother -tearful during exam -referral to therapy for PTSD/grief

## 2021-03-13 NOTE — Assessment & Plan Note (Addendum)
-  unable to sleep -he states xanax was confiscated while at [facility] for surgery around 02/20/21 -was planning on prescribing a short course of Dayvigo pending submission of urine drug screen (UDS), but patient refused to void -he said he would return after lunch to have labs drawn and submit urine sample; he did not return from lunch and had serum labs drawn at [facility], but failed to submit urine drug screen at their lab -pt called requesting sleep medication, and got irate and used copious profanity while talking to my LPN; still failed to submit a UDS after being told that prescription was pending a completed UDS

## 2021-03-13 NOTE — Telephone Encounter (Signed)
Patient came back in the office and said nothing was called into his pharmacy to help him sleep.  Patient said Casimiro Needle was sending some medicine in.    [facility] in [place]

## 2021-03-13 NOTE — Progress Notes (Signed)
Bilirubin is elevated. Let's repeat labs in a week to follow-up on this. Please fast before the labs.  Since he had recent surgery, if he has pain or issues with sutures, he should call his surgeon.

## 2021-03-13 NOTE — Assessment & Plan Note (Signed)
-  last fill xanax 02/15/21; states home xanax was confiscated by [facility]

## 2021-03-13 NOTE — Progress Notes (Signed)
Acute Office Visit  Subjective:    Patient ID: Christian Ellison, male    DOB: 04/22/1961, 60 y.o.   MRN: [id number]  Chief Complaint  Patient presents with   Abdominal Pain    Recently had abdominal surgery 02/20/21. Hasn't been sleeping well but when he does he's afraid he is going to rip one of internal stitches. Has also not had a bm in over a week.     Abdominal Pain Associated symptoms include constipation. Pertinent negatives include no diarrhea.  Patient is in today for abdominal pain.  He has hx of aspiration PNA.  For sleep, he has tried [place] (but it caused sleepwalking), trazodone (he felt groggy/hungover until noon), seroquel (ineffective).  He states that he takes 2 mg of xanax at bedtime to help him sleep. PMP database shows last fill was 02/15/21. He states that at [facility], his home medication was confiscated, and he is without his xanax currently. He is followed by Dr. Toy Care.  He states he hasn't had a BM in over a week.   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    Opiate abuse, continuous ([place])    Pulmonary edema 02/02/2018   Sepsis due to pneumonia ([place]) 03/11/2019   Sleep apnea    CPAP nightly    Past Surgical History:  Procedure Laterality Date   BIOPSY  03/18/2018   Procedure: BIOPSY;  Surgeon: Rogene Houston, MD;  [facility];  Service: Endoscopy;;  gastric    COLONOSCOPY N/A 11/22/2013   Dr. Laural Golden: Normal except for hemorrhoids.  Next colonoscopy 10 years.   ESOPHAGEAL DILATION N/A 03/18/2018   Procedure: ESOPHAGEAL DILATION;  Surgeon: Rogene Houston, MD;  [facility];  Service: Endoscopy;  Laterality: N/A;   ESOPHAGEAL DILATION N/A 08/07/2020   Procedure: ESOPHAGEAL DILATION;  Surgeon: Rogene Houston,  MD;  [facility];  Service: Endoscopy;  Laterality: N/A;   ESOPHAGEAL MANOMETRY N/A 06/15/2018   Procedure: ESOPHAGEAL MANOMETRY (EM);  Surgeon: Mauri Pole, MD;  [facility];  Service: Endoscopy;  Laterality: N/A;   ESOPHAGOGASTRODUODENOSCOPY (EGD) WITH PROPOFOL N/A 03/18/2018   Dr. Laural Golden: Benign-appearing mild esophageal stenosis proximal to the GE J, status post dilation.  2 cm hiatal hernia.  Gastritis, biopsies negative for H. pylori   ESOPHAGOGASTRODUODENOSCOPY (EGD) WITH PROPOFOL N/A 08/07/2020   Procedure: ESOPHAGOGASTRODUODENOSCOPY (EGD) WITH PROPOFOL;  Surgeon: Rogene Houston, MD;  [facility];  Service: Endoscopy;  Laterality: N/A;   Fracture Right Leg     Patient has rod/screws in this leg   ORIF CALCANEOUS FRACTURE Left 07/04/2020   Procedure: OPEN REDUCTION INTERNAL FIXATION (ORIF) LEFT TALUS AND CALCANEOUS FRACTURE, SUBTALAR JOINT ARTHROTOMY WITH REMOVAL OF LOOSE BODIES, TALONAVICULAR JOINT [place] WITH REMOVAL OF LOOSE BODIES;  Surgeon: Erle Crocker, MD;  Location: [facility];  Service: Orthopedics;  Laterality: Left;   Rotor Cuff  2012   Right Shoulder    Family History  Problem Relation Age of Onset   Breast cancer Mother    Kidney failure Mother    Stroke Mother    Colon cancer Father    Bladder Cancer Father    Prostate cancer Father  Hypertension Sister    Hypothyroidism Sister    Healthy Sister    Healthy Daughter    Healthy Son    Healthy Son     Social History   Socioeconomic History   Marital status: Married    Spouse name: Not on file   Number of children: Not on file   Years of education: Not on file   Highest education level: Not on file  Occupational History   Occupation: unemployed  Tobacco Use   Smoking status: Never   Smokeless tobacco: Never  Vaping Use   Vaping Use: Never used  Substance and Sexual Activity   Alcohol use: Not Currently   Drug use: Not Currently    Types:  Benzodiazepines    Comment: opiates   Sexual activity: Not on file  Other Topics Concern   Not on file  Social History Narrative   Not on file   Social Determinants of Health   Financial Resource Strain: Not on file  Food Insecurity: Not on file  Transportation Needs: Not on file  Physical Activity: Not on file  Stress: Not on file  Social Connections: Not on file  Intimate Partner Violence: Not on file    Outpatient Medications Prior to Visit  Medication Sig Dispense Refill   acetaminophen (TYLENOL) 325 MG tablet Take 2 tablets (650 mg total) by mouth every 6 (six) hours as needed for mild pain (or Fever >/= 101). 30 tablet 1   albuterol (PROVENTIL) (2.5 MG/3ML) 0.083% nebulizer solution Take 3 mLs (2.5 mg total) by nebulization every 4 (four) hours as needed for wheezing. 75 mL 2   albuterol (VENTOLIN HFA) 108 (90 Base) MCG/ACT inhaler Inhale 2 puffs into the lungs every 6 (six) hours as needed for wheezing or shortness of breath. 8 g 2   aspirin EC 81 MG tablet Take 1 tablet (81 mg total) by mouth at bedtime.     Cimetidine (TAGAMET PO) Take 75 mg by mouth daily as needed (if he feels his food coming up after meal).     fluticasone (FLONASE) 50 MCG/ACT nasal spray Place 2 sprays into both nostrils daily. 16 g 6   levothyroxine (SYNTHROID) 25 MCG tablet TAKE 1 TABLET BY MOUTH EVERY DAY (Patient taking differently: Take 25 mcg by mouth daily before breakfast.) 90 tablet 1   pantoprazole (PROTONIX) 40 MG tablet Take 1 tablet (40 mg total) by mouth 2 (two) times daily. 60 tablet 5   ALPRAZolam (XANAX) 1 MG tablet Take 2 mg by mouth at bedtime as needed for anxiety. (Patient not taking: Reported on 03/13/2021)     Multiple Vitamin (MULTIVITAMIN WITH MINERALS) TABS tablet Take 1 tablet by mouth daily. 50+ (Patient not taking: Reported on 03/13/2021)     No facility-administered medications prior to visit.    No Known Allergies  Review of Systems  Constitutional: Negative.    Gastrointestinal:  Positive for abdominal distention, abdominal pain and constipation. Negative for blood in stool and diarrhea.  Psychiatric/Behavioral:  Positive for dysphoric mood and sleep disturbance. Negative for self-injury and suicidal ideas. The patient is nervous/anxious.       Objective:    Physical Exam Constitutional:      Appearance: He is well-developed.  Abdominal:     General: Bowel sounds are increased. There is distension. There is no abdominal bruit.     Palpations: Abdomen is soft. There is no shifting dullness, fluid wave, hepatomegaly, splenomegaly, mass or pulsatile mass.     Tenderness: There is  generalized abdominal tenderness. There is no guarding or rebound.     Hernia: No hernia is present.     Comments: Scabs from recent surgery; healing well  Neurological:     Mental Status: He is alert.    BP (!) 182/115 (BP Location: Left Arm, Patient Position: Sitting, Cuff Size: Large)   Pulse 85   Temp (!) 97.3 F (36.3 C) (Temporal)   Ht 6' (1.829 m)   Wt 200 lb (90.7 kg)   SpO2 95%   BMI 27.12 kg/m  Wt Readings from Last 3 Encounters:  03/13/21 200 lb (90.7 kg)  01/16/21 196 lb 6.4 oz (89.1 kg)  12/23/20 195 lb 12.3 oz (88.8 kg)    Health Maintenance Due  Topic Date Due   Zoster Vaccines- Shingrix (1 of 2) Never done   Pneumococcal Vaccine 64-57 Years old (2 - PCV) 05/29/2019    There are no preventive care reminders to display for this patient.   Lab Results  Component Value Date   TSH 1.609 12/23/2020   Lab Results  Component Value Date   WBC 7.4 03/13/2021   HGB 14.8 03/13/2021   HCT 45.0 03/13/2021   MCV 86.7 03/13/2021   PLT 251 03/13/2021   Lab Results  Component Value Date   NA 136 03/13/2021   K 4.1 03/13/2021   CO2 25 03/13/2021   GLUCOSE 119 (H) 03/13/2021   BUN 19 03/13/2021   CREATININE 0.83 03/13/2021   BILITOT 1.5 (H) 03/13/2021   ALKPHOS 65 03/13/2021   AST 23 03/13/2021   ALT 26 03/13/2021   PROT 8.9 (H)  03/13/2021   ALBUMIN 5.1 (H) 03/13/2021   CALCIUM 9.4 03/13/2021   ANIONGAP 11 03/13/2021   No results found for: CHOL No results found for: HDL No results found for: [facility] Lab Results  Component Value Date   TRIG 95 01/02/2018   No results found for: [facility] Lab Results  Component Value Date   HGBA1C 5.3 08/30/2020       Assessment & Plan:   Problem List Items Addressed This Visit       Other   Benzodiazepine dependence ([place])    -last fill xanax 02/15/21; states home xanax was confiscated by [facility]       Relevant Orders   [id number] 11+Oxyco+Alc+Crt-Bund   Insomnia    -unable to sleep -he states xanax was confiscated while at [facility] for surgery around 02/20/21 -was planning on prescribing a short course of Dayvigo pending submission of urine drug screen (UDS), but patient refused to void -he said he would return after lunch to have labs drawn and submit urine sample; he did not return from lunch and had serum labs drawn at [facility], but failed to submit urine drug screen at their lab -pt called requesting sleep medication, and got irate and used copious profanity while talking to my LPN; still failed to submit a UDS after being told that prescription was pending a completed UDS        Relevant Orders   [id number] 11+Oxyco+Alc+Crt-Bund   PTSD (post-traumatic stress disorder)    -had multiple family members die in the last few years including his son and mother -tearful during exam -referral to therapy for PTSD/grief       Relevant Orders   Ambulatory referral to Psychiatry   Tachycardia    -may be related to anxiety; possibly benzo withdrawal -will get UDS to confirm that he hasn't had benzos in over 2 weeks--- pt refused UDS and  got irate after being told that UDS was requested       Relevant Orders   CBC with Differential/Platelet   CMP14+EGFR   [id number] 11+Oxyco+Alc+Crt-Bund   Hyperbilirubinemia    -had abdominal surgery 02/20/21 -repeat labs in 1 week to see if  this is trending down -if he has any acute issues, he should follow-up with his surgeon       Other Visit Diagnoses     Other constipation    -  Primary   Relevant Orders   DG Abd 2 Views   CBC with Differential/Platelet   CMP14+EGFR        No orders of the defined types were placed in this encounter.  Time spent: 40 minutes  Noreene Larsson, NP

## 2021-03-13 NOTE — Assessment & Plan Note (Addendum)
-  may be related to anxiety; possibly benzo withdrawal -will get UDS to confirm that he hasn't had benzos in over 2 weeks--- pt refused UDS and got irate after being told that UDS was requested

## 2021-03-13 NOTE — Telephone Encounter (Signed)
Called patient who immediately started using profanity demanding we send him in something to help him sleep. I informed him that the provider first wanted a drug screen which he said he could not give prior to leaving our office before getting his xray. Pt was informed to return to our office following the xray to get the labs and a drug screen drawn. Pt did not come back. I informed him this is why the medication has not been sent in, we were waiting on him getting the drug screen. Pt continued to use profanity and ultimately hung up on me.

## 2021-03-13 NOTE — Assessment & Plan Note (Signed)
-  had abdominal surgery 02/20/21 -repeat labs in 1 week to see if this is trending down -if he has any acute issues, he should follow-up with his surgeon

## 2021-03-13 NOTE — Telephone Encounter (Signed)
Let's dismiss this patient for harassment.

## 2021-03-13 NOTE — Patient Instructions (Addendum)
We will get an x-ray today to assess stool burden. If you are not having BMs or abdominal issues you will need to follow-up with your surgeon. If acute pain develops, you will need to go to the emergency department.  Please have labs drawn today.

## 2021-03-13 NOTE — Progress Notes (Signed)
He wasn't fasting for these labs.

## 2021-03-17 ENCOUNTER — Encounter: Payer: Self-pay | Admitting: Nurse Practitioner

## 2021-03-17 ENCOUNTER — Telehealth: Payer: Self-pay

## 2021-03-17 NOTE — Telephone Encounter (Signed)
Sent letter via My-Chart and mailed 03/17/21 - Term letter from [facility]

## 2021-03-17 NOTE — Progress Notes (Signed)
X-ray says that you have gas, but no obstruction or constipation. He can try simethicone.

## 2021-03-17 NOTE — Progress Notes (Signed)
Which is available OTC.

## 2021-04-10 ENCOUNTER — Telehealth (INDEPENDENT_AMBULATORY_CARE_PROVIDER_SITE_OTHER): Payer: Self-pay | Admitting: Internal Medicine

## 2021-04-10 NOTE — Telephone Encounter (Signed)
Pt's wife called and left vm to call back and I called back and left message on both numbers left on vm to return call  [phone]

## 2021-04-10 NOTE — Telephone Encounter (Signed)
Called pt and wife states he is in a meeting and wanted a call back around 2 today.

## 2021-04-10 NOTE — Telephone Encounter (Signed)
Patient left message on voice mail wanting to talk to Dr Karilyn Cota regarding his previous care from him - he's very appreciative and would like to speak with him. Ph# [phone] or [phone]

## 2021-04-17 ENCOUNTER — Encounter (INDEPENDENT_AMBULATORY_CARE_PROVIDER_SITE_OTHER): Payer: Self-pay | Admitting: Gastroenterology

## 2021-04-17 ENCOUNTER — Ambulatory Visit (INDEPENDENT_AMBULATORY_CARE_PROVIDER_SITE_OTHER): Payer: Medicare Other | Admitting: Gastroenterology

## 2021-04-17 ENCOUNTER — Other Ambulatory Visit: Payer: Self-pay

## 2021-04-17 VITALS — BP 134/79 | HR 76 | Temp 99.0°F | Ht 72.0 in | Wt 202.9 lb

## 2021-04-17 DIAGNOSIS — K222 Esophageal obstruction: Secondary | ICD-10-CM

## 2021-04-17 DIAGNOSIS — Z8619 Personal history of other infectious and parasitic diseases: Secondary | ICD-10-CM

## 2021-04-17 DIAGNOSIS — K2100 Gastro-esophageal reflux disease with esophagitis, without bleeding: Secondary | ICD-10-CM | POA: Diagnosis not present

## 2021-04-17 DIAGNOSIS — K429 Umbilical hernia without obstruction or gangrene: Secondary | ICD-10-CM | POA: Insufficient documentation

## 2021-04-17 DIAGNOSIS — F419 Anxiety disorder, unspecified: Secondary | ICD-10-CM | POA: Insufficient documentation

## 2021-04-17 NOTE — Progress Notes (Signed)
Katrinka Blazing, M.D. Gastroenterology & Hepatology [facility] [address] [place], [place] [address]  Primary Care Physician: Pcp, No No address on file  I will communicate my assessment and recommendations to the referring MD via EMR.  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 Umbilical hernia History of hepatitis C s/p Harvoni  History of Present Illness: Christian Ellison is a 60 y.o. 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  The patient had a complicated history of achalasia versus EGJOO complicated by multiple episodes of aspiration pneumonia.  He was ultimately referred to [facility] to undergo treatment for this.  Patient underwent a robot assisted laparoscopic Heller myotomy with Dor fundoplication 02/20/2021.  The patient states that his symptoms have much more improved after he underwent his surgical treatment.  Patient states he is drinking all type of liquids and eating mostly soft food, but still avoiding streak as he wants to avoid having any aspiration events.  He tries to chop meat as much as possible if he eats and takes his time chewing his food thoroughly. He is taking pantoprazole BID for GERD but the stated that his heartburn is completely controlled and denies having any complaints regarding this.  The patient is concerned as after he underwent his surgery he has noticed a protuberance in his umbilical area which was not present before.  He reports that he does not cause severe pain but he has noticed that it is uncomfortable when he is doing different repairs at his home.  He denies  any constipation or changes in the coloration of his skin.  The patient endorses that he has been under significant stress in his life as multiple family members have passed away within the last year.  He is currently following with his [employer] group but is interested in having further support with mental health group.  The patient denies having any nausea, vomiting, fever, chills, hematochezia, melena, hematemesis, abdominal distention, diarrhea, jaundice, pruritus or weight loss.  Last HCV viral load was negative on 02/23/2019.  Most recent blood work-up from 03/13/2021 showed normal AST of 23, ALT of 26, alkaline phosphatase 65, mildly elevated total bilirubin of 1.5, normal CBC.  Last EGD: Last Colonoscopy: 2015  Prep excellent. Normal mucosa without polyps or diverticular changes. Redundant sigmoid colon. Normal rectal mucosa. Small hemorrhoids below the dentate line.  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    Opiate abuse, continuous ([facility])    Pulmonary edema 02/02/2018   Sepsis due to pneumonia ([facility]) 03/11/2019   Sleep apnea    CPAP nightly    Past Surgical History: Past Surgical History:  Procedure Laterality Date   BIOPSY  03/18/2018   Procedure: BIOPSY;  Surgeon: Malissa Hippo, MD;  [facility];  Service: Endoscopy;;  gastric    COLONOSCOPY N/A 11/22/2013   Dr. Karilyn Cota: Normal except for hemorrhoids.  Next colonoscopy 10 years.  ESOPHAGEAL DILATION N/A 03/18/2018   Procedure: ESOPHAGEAL DILATION;  Surgeon: Malissa Hippo, MD;  [facility];  Service: Endoscopy;  Laterality: N/A;   ESOPHAGEAL DILATION N/A 08/07/2020   Procedure: ESOPHAGEAL DILATION;  Surgeon: Malissa Hippo, MD;  [facility];  Service: Endoscopy;  Laterality: N/A;   ESOPHAGEAL MANOMETRY N/A 06/15/2018   Procedure: ESOPHAGEAL MANOMETRY (EM);  Surgeon: Napoleon Form, MD;  [facility];  Service: Endoscopy;  Laterality: N/A;   ESOPHAGOGASTRODUODENOSCOPY (EGD) WITH PROPOFOL N/A 03/18/2018   Dr. Karilyn Cota: Benign-appearing mild esophageal stenosis proximal to the GE J, status post dilation.  2 cm hiatal hernia.  Gastritis, biopsies negative for H. pylori   ESOPHAGOGASTRODUODENOSCOPY (EGD) WITH PROPOFOL N/A 08/07/2020   Procedure: ESOPHAGOGASTRODUODENOSCOPY (EGD) WITH PROPOFOL;  Surgeon: Malissa Hippo, MD;  [facility];  Service: Endoscopy;  Laterality: N/A;   Fracture Right Leg     Patient has rod/screws in this leg   ORIF CALCANEOUS FRACTURE Left 07/04/2020   Procedure: OPEN REDUCTION INTERNAL FIXATION (ORIF) LEFT TALUS AND CALCANEOUS FRACTURE, SUBTALAR JOINT ARTHROTOMY WITH REMOVAL OF LOOSE BODIES, TALONAVICULAR JOINT ARTHOTOMY WITH REMOVAL OF LOOSE BODIES;  Surgeon: Terance Hart, MD;  Location: [facility];  Service: Orthopedics;  Laterality: Left;   Rotor Cuff  2012   Right Shoulder    Family History: Family History  Problem Relation Age of Onset   Breast cancer Mother    Kidney failure Mother    Stroke Mother    Colon cancer Father    Bladder Cancer Father    Prostate cancer Father    Hypertension Sister    Hypothyroidism Sister    Healthy Sister    Healthy Daughter    Healthy Son    Healthy Son     Social History: Social History   Tobacco Use  Smoking Status Never  Smokeless Tobacco Never   Social History   Substance and Sexual Activity  Alcohol Use Not Currently   Social History   Substance and Sexual Activity  Drug Use Not Currently   Types: Benzodiazepines   Comment: opiates    Allergies: No Known Allergies  Medications: Current Outpatient Medications  Medication Sig Dispense Refill   acetaminophen (TYLENOL) 325 MG tablet Take 2 tablets  (650 mg total) by mouth every 6 (six) hours as needed for mild pain (or Fever >/= 101). 30 tablet 1   albuterol (PROVENTIL) (2.5 MG/3ML) 0.083% nebulizer solution Take 3 mLs (2.5 mg total) by nebulization every 4 (four) hours as needed for wheezing. 75 mL 2   albuterol (VENTOLIN HFA) 108 (90 Base) MCG/ACT inhaler Inhale 2 puffs into the lungs every 6 (six) hours as needed for wheezing or shortness of breath. 8 g 2   ALPRAZolam (XANAX) 1 MG tablet Take 2 mg by mouth at bedtime as needed for anxiety.     aspirin EC 81 MG tablet Take 1 tablet (81 mg total) by mouth at bedtime.     Cimetidine (TAGAMET PO) Take 75 mg by mouth daily as needed (if he feels his food coming up after meal).     cyanocobalamin 1000 MCG tablet Take 1,000 mcg by mouth daily.     levothyroxine (SYNTHROID) 25 MCG tablet TAKE 1 TABLET BY MOUTH EVERY DAY (Patient taking differently: Take 25 mcg by mouth daily before breakfast.) 90 tablet 1   pantoprazole (PROTONIX) 40 MG tablet Take 1 tablet (40 mg total) by mouth 2 (two) times daily. 60 tablet 5  fluticasone (FLONASE) 50 MCG/ACT nasal spray Place 2 sprays into both nostrils daily. (Patient not taking: Reported on 04/17/2021) 16 g 6   No current facility-administered medications for this visit.    Review of Systems: GENERAL: negative for malaise, night sweats HEENT: No changes in hearing or vision, no nose bleeds or other nasal problems. NECK: Negative for lumps, goiter, pain and significant neck swelling RESPIRATORY: Negative for cough, wheezing CARDIOVASCULAR: Negative for chest pain, leg swelling, palpitations, orthopnea GI: SEE HPI MUSCULOSKELETAL: Negative for joint pain or swelling, back pain, and muscle pain. SKIN: Negative for lesions, rash PSYCH: Negative for sleep disturbance, mood disorder and recent psychosocial stressors. HEMATOLOGY Negative for prolonged bleeding, bruising easily, and swollen nodes. ENDOCRINE: Negative for cold or heat intolerance, polyuria,  polydipsia and goiter. NEURO: negative for tremor, gait imbalance, syncope and seizures. The remainder of the review of systems is noncontributory.   Physical Exam: BP 134/79 (BP Location: Left Arm, Patient Position: Sitting, Cuff Size: Large)   Pulse 76   Temp 99 F (37.2 C) (Oral)   Ht 6' (1.829 m)   Wt 202 lb 14.4 oz (92 kg)   BMI 27.52 kg/m  GENERAL: The patient is AO x3, in no acute distress. HEENT: Head is normocephalic and atraumatic. EOMI are intact. Mouth is well hydrated and without lesions. NECK: Supple. No masses LUNGS: Clear to auscultation. No presence of rhonchi/wheezing/rales. Adequate chest expansion HEART: RRR, normal s1 and s2. ABDOMEN: Soft, nontender, no guarding, no peritoneal signs, and nondistended. BS +. Has presence of a small umbilical hernia which is reducible, no changes in coloration. Has small surgical scars. EXTREMITIES: Without any cyanosis, clubbing, rash, lesions or edema. NEUROLOGIC: AOx3, no focal motor deficit. SKIN: no jaundice, no rashes  Imaging/Labs: as above  I personally reviewed and interpreted the available labs, imaging and endoscopic files.  Impression and Plan: Christian Ellison is a 60 y.o. 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  The patient has significantly improved after he underwent his recent surgical intervention.  His motility has much more improved after this and he has been able to advance his diet adequately.  Importantly, he has not presented any more aspiration events.  The patient is extremely happy with the outcome he had.  He is not presenting any active GERD symptoms, as he had Dor fundoplication we will attempt to decrease his pantoprazole to once a  day dosing which he agreed with.    The patient also has developed a mild uncomplicated umbilical hernia.  He is very concerned about this as he has led to persistent discomfort when performing daily activities.  We will refer him to general surgery for further evaluation per patient request.  He will be referred to psychiatry for evaluation of his anxiety and persistent bereavement.  Finally, he did not have HCVRNA checked in his last appointment, this will be ordered today to confirm SVR.  It is likely he has achieved it as his aminotransferases have been normal.  -Referral to psychiatry - Referral to general surgery - Advance diet as tolerated - Decrease pantoprazole to 40 mg qday - Will check repeat HCV RNA to confirm SVR  All questions were answered.      Dolores Frame, MD Gastroenterology and Hepatology [facility]

## 2021-04-17 NOTE — Patient Instructions (Addendum)
Referral to psychiatry Referral to general surgery Advance diet as tolerated Decrease pantoprazole to 40 mg qday

## 2021-04-18 ENCOUNTER — Other Ambulatory Visit (INDEPENDENT_AMBULATORY_CARE_PROVIDER_SITE_OTHER): Payer: Self-pay

## 2021-04-18 ENCOUNTER — Telehealth (INDEPENDENT_AMBULATORY_CARE_PROVIDER_SITE_OTHER): Payer: Self-pay

## 2021-04-18 ENCOUNTER — Ambulatory Visit: Payer: 59 | Admitting: Internal Medicine

## 2021-04-18 NOTE — Telephone Encounter (Signed)
Hi Christian Ellison,   Can you please call the patient and let him know we have to check a blood test to confirm he is cured from hep C? I ordered the HCV RNA, can you please mail the order?   Thanks,   Katrinka Blazing, MD  Gastroenterology and Hepatology  [facility]

## 2021-04-18 NOTE — Telephone Encounter (Signed)
Patient is aware of all and Lab work mailed to the patient.

## 2021-05-20 ENCOUNTER — Ambulatory Visit (INDEPENDENT_AMBULATORY_CARE_PROVIDER_SITE_OTHER): Payer: Medicare Other | Admitting: Gastroenterology

## 2021-06-30 ENCOUNTER — Other Ambulatory Visit: Payer: Self-pay | Admitting: Internal Medicine

## 2021-10-14 ENCOUNTER — Other Ambulatory Visit: Payer: Self-pay | Admitting: Internal Medicine

## 2021-11-11 ENCOUNTER — Encounter: Payer: Self-pay | Admitting: *Deleted

## 2021-12-01 ENCOUNTER — Other Ambulatory Visit: Payer: Self-pay | Admitting: *Deleted

## 2021-12-01 DIAGNOSIS — K429 Umbilical hernia without obstruction or gangrene: Secondary | ICD-10-CM

## 2021-12-02 ENCOUNTER — Ambulatory Visit: Payer: 59 | Admitting: General Surgery

## 2022-02-11 ENCOUNTER — Encounter: Payer: Self-pay | Admitting: *Deleted

## 2022-03-17 ENCOUNTER — Encounter: Payer: Self-pay | Admitting: General Surgery

## 2022-03-17 ENCOUNTER — Ambulatory Visit (INDEPENDENT_AMBULATORY_CARE_PROVIDER_SITE_OTHER): Payer: 59 | Admitting: General Surgery

## 2022-03-17 VITALS — BP 120/82 | HR 69 | Temp 98.0°F | Resp 14 | Ht 72.0 in | Wt 227.0 lb

## 2022-03-17 DIAGNOSIS — K429 Umbilical hernia without obstruction or gangrene: Secondary | ICD-10-CM

## 2022-03-17 NOTE — H&P (Signed)
Christian Ellison; [id number]; 09/01/1960   HPI Patient is a 61 year old white male who was referred to my care by Dr. Olena Leatherwood for evaluation and treatment of an umbilical hernia.  He states that it has increased in size over the past year.  It is made worse with straining.  It can sometimes be tender to touch.  He denies any nausea or vomiting. 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    Opiate abuse, continuous (HCC)    Pulmonary edema 02/02/2018   Sepsis due to pneumonia (HCC) 03/11/2019   Sleep apnea    CPAP nightly    Past Surgical History:  Procedure Laterality Date   BIOPSY  03/18/2018   Procedure: BIOPSY;  Surgeon: Malissa Hippo, MD;  Location: [facility];  Service: Endoscopy;;  gastric    COLONOSCOPY N/A 11/22/2013   Dr. Karilyn Cota: Normal except for hemorrhoids.  Next colonoscopy 10 years.   ESOPHAGEAL DILATION N/A 03/18/2018   Procedure: ESOPHAGEAL DILATION;  Surgeon: Malissa Hippo, MD;  Location: [facility];  Service: Endoscopy;  Laterality: N/A;   ESOPHAGEAL DILATION N/A 08/07/2020   Procedure: ESOPHAGEAL DILATION;  Surgeon: Malissa Hippo, MD;  Location: [facility];  Service: Endoscopy;  Laterality: N/A;   ESOPHAGEAL MANOMETRY N/A 06/15/2018   Procedure: ESOPHAGEAL MANOMETRY (EM);  Surgeon: Napoleon Form, MD;  Location: [facility];  Service: Endoscopy;  Laterality: N/A;   ESOPHAGOGASTRODUODENOSCOPY (EGD) WITH PROPOFOL N/A 03/18/2018   Dr. Karilyn Cota: Benign-appearing mild esophageal stenosis proximal to the GE J, status post dilation.  2 cm hiatal hernia.  Gastritis, biopsies negative for H. pylori   ESOPHAGOGASTRODUODENOSCOPY (EGD) WITH PROPOFOL N/A 08/07/2020   Procedure: ESOPHAGOGASTRODUODENOSCOPY (EGD) WITH PROPOFOL;  Surgeon: Malissa Hippo, MD;  Location: [facility];  Service: Endoscopy;  Laterality: N/A;   Fracture Right Leg     Patient has rod/screws in this leg   ORIF CALCANEOUS FRACTURE Left 07/04/2020   Procedure: OPEN REDUCTION INTERNAL FIXATION (ORIF) LEFT TALUS AND CALCANEOUS FRACTURE, SUBTALAR JOINT ARTHROTOMY WITH REMOVAL OF LOOSE BODIES, TALONAVICULAR JOINT ARTHOTOMY WITH REMOVAL OF LOOSE BODIES;  Surgeon: Terance Hart, MD;  Location: [facility];  Service: Orthopedics;  Laterality: Left;   Rotor Cuff  2012   Right Shoulder    Family History  Problem Relation Age of Onset   Breast cancer Mother    Kidney failure Mother    Stroke Mother    Colon cancer Father    Bladder Cancer Father    Prostate cancer Father    Hypertension Sister    Hypothyroidism Sister    Healthy Sister    Healthy Daughter    Healthy Son    Healthy Son     Current Outpatient Medications on File Prior to Visit  Medication Sig Dispense Refill   albuterol (PROVENTIL) (2.5 MG/3ML) 0.083% nebulizer solution Take 3 mLs (2.5 mg total) by nebulization every 4 (four) hours as needed for wheezing. 75 mL 2   albuterol (VENTOLIN HFA) 108 (90 Base) MCG/ACT inhaler Inhale 2 puffs into the lungs every 6 (six) hours as needed for wheezing or shortness of breath. 8 g 2   ALPRAZolam (XANAX) 1 MG tablet Take 2 mg by mouth at bedtime  as needed for anxiety.     escitalopram (LEXAPRO) 20 MG tablet Take 20 mg by mouth daily.     levothyroxine (SYNTHROID) 25 MCG tablet TAKE 1 TABLET BY MOUTH EVERY DAY (Patient taking differently: Take 25 mcg by mouth daily before breakfast.) 90 tablet 1   losartan (COZAAR) 100 MG tablet Take 100 mg by mouth daily.     pantoprazole (PROTONIX) 40 MG tablet Take 1 tablet (40 mg total) by mouth 2 (two) times daily. 60 tablet 5   QUEtiapine (SEROQUEL) 25 MG tablet Take 25 mg by mouth at bedtime.     acetaminophen (TYLENOL) 325 MG tablet Take 2 tablets (650 mg total) by mouth every 6 (six) hours as needed for mild pain  (or Fever >/= 101). 30 tablet 1   No current facility-administered medications on file prior to visit.    No Known Allergies  Social History   Substance and Sexual Activity  Alcohol Use Not Currently    Social History   Tobacco Use  Smoking Status Never  Smokeless Tobacco Never    Review of Systems  Constitutional:  Positive for malaise/fatigue.  HENT:  Positive for sinus pain.   Eyes:  Positive for blurred vision.  Respiratory:  Positive for shortness of breath.   Cardiovascular: Negative.   Gastrointestinal:  Positive for abdominal pain.  Genitourinary: Negative.   Musculoskeletal:  Positive for back pain, joint pain and neck pain.  Skin: Negative.   Neurological: Negative.   Endo/Heme/Allergies: Negative.   Psychiatric/Behavioral: Negative.      Objective   Vitals:   03/17/22 1324  BP: 120/82  Pulse: 69  Resp: 14  Temp: 98 F (36.7 C)  SpO2: 92%    Physical Exam Vitals reviewed.  Constitutional:      Appearance: Normal appearance. He is normal weight. He is not ill-appearing.  HENT:     Head: Normocephalic and atraumatic.  Cardiovascular:     Rate and Rhythm: Normal rate and regular rhythm.     Heart sounds: Normal heart sounds. No murmur heard.    No friction rub. No gallop.  Pulmonary:     Effort: Pulmonary effort is normal. No respiratory distress.     Breath sounds: Normal breath sounds. No stridor. No wheezing, rhonchi or rales.  Abdominal:     General: Abdomen is flat. Bowel sounds are normal. There is no distension.     Palpations: Abdomen is soft. There is no mass.     Tenderness: There is no abdominal tenderness. There is no guarding or rebound.     Hernia: A hernia is present.     Comments: A diastases recti is present.  A less than 3 cm easily reducible umbilical hernia is present.  Skin:    General: Skin is warm and dry.  Neurological:     Mental Status: He is alert and oriented to person, place, and time.     Assessment   Umbilical hernia Plan  Patient is scheduled for an umbilical herniorrhaphy with mesh on 03/26/2022.  The risks and benefits of the procedure including bleeding, infection, mesh use, and the possibility of recurrence of the hernia were fully explained to the patient, who gave informed consent. 

## 2022-03-17 NOTE — Progress Notes (Signed)
Christian Ellison; [id number]; 09/01/1960   HPI Patient is a 61 year old white male who was referred to my care by Dr. Olena Leatherwood for evaluation and treatment of an umbilical hernia.  He states that it has increased in size over the past year.  It is made worse with straining.  It can sometimes be tender to touch.  He denies any nausea or vomiting. 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    Opiate abuse, continuous (HCC)    Pulmonary edema 02/02/2018   Sepsis due to pneumonia (HCC) 03/11/2019   Sleep apnea    CPAP nightly    Past Surgical History:  Procedure Laterality Date   BIOPSY  03/18/2018   Procedure: BIOPSY;  Surgeon: Malissa Hippo, MD;  Location: [facility];  Service: Endoscopy;;  gastric    COLONOSCOPY N/A 11/22/2013   Dr. Karilyn Cota: Normal except for hemorrhoids.  Next colonoscopy 10 years.   ESOPHAGEAL DILATION N/A 03/18/2018   Procedure: ESOPHAGEAL DILATION;  Surgeon: Malissa Hippo, MD;  Location: [facility];  Service: Endoscopy;  Laterality: N/A;   ESOPHAGEAL DILATION N/A 08/07/2020   Procedure: ESOPHAGEAL DILATION;  Surgeon: Malissa Hippo, MD;  Location: [facility];  Service: Endoscopy;  Laterality: N/A;   ESOPHAGEAL MANOMETRY N/A 06/15/2018   Procedure: ESOPHAGEAL MANOMETRY (EM);  Surgeon: Napoleon Form, MD;  Location: [facility];  Service: Endoscopy;  Laterality: N/A;   ESOPHAGOGASTRODUODENOSCOPY (EGD) WITH PROPOFOL N/A 03/18/2018   Dr. Karilyn Cota: Benign-appearing mild esophageal stenosis proximal to the GE J, status post dilation.  2 cm hiatal hernia.  Gastritis, biopsies negative for H. pylori   ESOPHAGOGASTRODUODENOSCOPY (EGD) WITH PROPOFOL N/A 08/07/2020   Procedure: ESOPHAGOGASTRODUODENOSCOPY (EGD) WITH PROPOFOL;  Surgeon: Malissa Hippo, MD;  Location: [facility];  Service: Endoscopy;  Laterality: N/A;   Fracture Right Leg     Patient has rod/screws in this leg   ORIF CALCANEOUS FRACTURE Left 07/04/2020   Procedure: OPEN REDUCTION INTERNAL FIXATION (ORIF) LEFT TALUS AND CALCANEOUS FRACTURE, SUBTALAR JOINT ARTHROTOMY WITH REMOVAL OF LOOSE BODIES, TALONAVICULAR JOINT ARTHOTOMY WITH REMOVAL OF LOOSE BODIES;  Surgeon: Terance Hart, MD;  Location: [facility];  Service: Orthopedics;  Laterality: Left;   Rotor Cuff  2012   Right Shoulder    Family History  Problem Relation Age of Onset   Breast cancer Mother    Kidney failure Mother    Stroke Mother    Colon cancer Father    Bladder Cancer Father    Prostate cancer Father    Hypertension Sister    Hypothyroidism Sister    Healthy Sister    Healthy Daughter    Healthy Son    Healthy Son     Current Outpatient Medications on File Prior to Visit  Medication Sig Dispense Refill   albuterol (PROVENTIL) (2.5 MG/3ML) 0.083% nebulizer solution Take 3 mLs (2.5 mg total) by nebulization every 4 (four) hours as needed for wheezing. 75 mL 2   albuterol (VENTOLIN HFA) 108 (90 Base) MCG/ACT inhaler Inhale 2 puffs into the lungs every 6 (six) hours as needed for wheezing or shortness of breath. 8 g 2   ALPRAZolam (XANAX) 1 MG tablet Take 2 mg by mouth at bedtime  as needed for anxiety.     escitalopram (LEXAPRO) 20 MG tablet Take 20 mg by mouth daily.     levothyroxine (SYNTHROID) 25 MCG tablet TAKE 1 TABLET BY MOUTH EVERY DAY (Patient taking differently: Take 25 mcg by mouth daily before breakfast.) 90 tablet 1   losartan (COZAAR) 100 MG tablet Take 100 mg by mouth daily.     pantoprazole (PROTONIX) 40 MG tablet Take 1 tablet (40 mg total) by mouth 2 (two) times daily. 60 tablet 5   QUEtiapine (SEROQUEL) 25 MG tablet Take 25 mg by mouth at bedtime.     acetaminophen (TYLENOL) 325 MG tablet Take 2 tablets (650 mg total) by mouth every 6 (six) hours as needed for mild pain  (or Fever >/= 101). 30 tablet 1   No current facility-administered medications on file prior to visit.    No Known Allergies  Social History   Substance and Sexual Activity  Alcohol Use Not Currently    Social History   Tobacco Use  Smoking Status Never  Smokeless Tobacco Never    Review of Systems  Constitutional:  Positive for malaise/fatigue.  HENT:  Positive for sinus pain.   Eyes:  Positive for blurred vision.  Respiratory:  Positive for shortness of breath.   Cardiovascular: Negative.   Gastrointestinal:  Positive for abdominal pain.  Genitourinary: Negative.   Musculoskeletal:  Positive for back pain, joint pain and neck pain.  Skin: Negative.   Neurological: Negative.   Endo/Heme/Allergies: Negative.   Psychiatric/Behavioral: Negative.      Objective   Vitals:   03/17/22 1324  BP: 120/82  Pulse: 69  Resp: 14  Temp: 98 F (36.7 C)  SpO2: 92%    Physical Exam Vitals reviewed.  Constitutional:      Appearance: Normal appearance. He is normal weight. He is not ill-appearing.  HENT:     Head: Normocephalic and atraumatic.  Cardiovascular:     Rate and Rhythm: Normal rate and regular rhythm.     Heart sounds: Normal heart sounds. No murmur heard.    No friction rub. No gallop.  Pulmonary:     Effort: Pulmonary effort is normal. No respiratory distress.     Breath sounds: Normal breath sounds. No stridor. No wheezing, rhonchi or rales.  Abdominal:     General: Abdomen is flat. Bowel sounds are normal. There is no distension.     Palpations: Abdomen is soft. There is no mass.     Tenderness: There is no abdominal tenderness. There is no guarding or rebound.     Hernia: A hernia is present.     Comments: A diastases recti is present.  A less than 3 cm easily reducible umbilical hernia is present.  Skin:    General: Skin is warm and dry.  Neurological:     Mental Status: He is alert and oriented to person, place, and time.     Assessment   Umbilical hernia Plan  Patient is scheduled for an umbilical herniorrhaphy with mesh on 03/26/2022.  The risks and benefits of the procedure including bleeding, infection, mesh use, and the possibility of recurrence of the hernia were fully explained to the patient, who gave informed consent.

## 2022-03-23 NOTE — Patient Instructions (Signed)
Your procedure is scheduled on: 03/26/2022  Report to [facility] Main Entrance at   6:00  AM.  Call this number if you have problems the morning of surgery: [phone]   Remember:   Do not Eat or Drink after midnight         No Smoking the morning of surgery  :  Take these medicines the morning of surgery with A SIP OF WATER: xanax, lexapro, and pantoprazole   Do not wear jewelry, make-up or nail polish.  Do not wear lotions, powders, or perfumes. You may wear deodorant.  Do not shave 48 hours prior to surgery. Men may shave face and neck.  Do not bring valuables to the hospital.  Contacts, dentures or bridgework may not be worn into surgery.  Leave suitcase in the car. After surgery it may be brought to your room.  For patients admitted to the hospital, checkout time is 11:00 AM the day of discharge.   Patients discharged the day of surgery will not be allowed to drive home.    Special Instructions: Shower using CHG night before surgery and shower the day of surgery use CHG.  Use special wash - you have one bottle of CHG for all showers.  You should use approximately 1/2 of the bottle for each shower.  How to Use Chlorhexidine Before Surgery Chlorhexidine gluconate (CHG) is a germ-killing (antiseptic) solution that is used to clean the skin. It can get rid of the bacteria that normally live on the skin and can keep them away for about 24 hours. To clean your skin with CHG, you may be given: A CHG solution to use in the shower or as part of a sponge bath. A prepackaged cloth that contains CHG. Cleaning your skin with CHG may help lower the risk for infection: While you are staying in the intensive care unit of the hospital. If you have a vascular access, such as a central line, to provide short-term or long-term access to your veins. If you have a catheter to drain urine from your bladder. If you are on a ventilator. A ventilator is a machine that helps you breathe by moving air in  and out of your lungs. After surgery. What are the risks? Risks of using CHG include: A skin reaction. Hearing loss, if CHG gets in your ears and you have a perforated eardrum. Eye injury, if CHG gets in your eyes and is not rinsed out. The CHG product catching fire. Make sure that you avoid smoking and flames after applying CHG to your skin. Do not use CHG: If you have a chlorhexidine allergy or have previously reacted to chlorhexidine. On babies younger than 37 months of age. How to use CHG solution Use CHG only as told by your health care provider, and follow the instructions on the label. Use the full amount of CHG as directed. Usually, this is one bottle. During a shower Follow these steps when using CHG solution during a shower (unless your health care provider gives you different instructions): Start the shower. Use your normal soap and shampoo to wash your face and hair. Turn off the shower or move out of the shower stream. Pour the CHG onto a clean washcloth. Do not use any type of brush or rough-edged sponge. Starting at your neck, lather your body down to your toes. Make sure you follow these instructions: If you will be having surgery, pay special attention to the part of your body where you will be  having surgery. Scrub this area for at least 1 minute. Do not use CHG on your head or face. If the solution gets into your ears or eyes, rinse them well with water. Avoid your genital area. Avoid any areas of skin that have broken skin, cuts, or scrapes. Scrub your back and under your arms. Make sure to wash skin folds. Let the lather sit on your skin for 1-2 minutes or as long as told by your health care provider. Thoroughly rinse your entire body in the shower. Make sure that all body creases and crevices are rinsed well. Dry off with a clean towel. Do not put any substances on your body afterward--such as powder, lotion, or perfume--unless you are told to do so by your health  care provider. Only use lotions that are recommended by the manufacturer. Put on clean clothes or pajamas. If it is the night before your surgery, sleep in clean sheets.  During a sponge bath Follow these steps when using CHG solution during a sponge bath (unless your health care provider gives you different instructions): Use your normal soap and shampoo to wash your face and hair. Pour the CHG onto a clean washcloth. Starting at your neck, lather your body down to your toes. Make sure you follow these instructions: If you will be having surgery, pay special attention to the part of your body where you will be having surgery. Scrub this area for at least 1 minute. Do not use CHG on your head or face. If the solution gets into your ears or eyes, rinse them well with water. Avoid your genital area. Avoid any areas of skin that have broken skin, cuts, or scrapes. Scrub your back and under your arms. Make sure to wash skin folds. Let the lather sit on your skin for 1-2 minutes or as long as told by your health care provider. Using a different clean, wet washcloth, thoroughly rinse your entire body. Make sure that all body creases and crevices are rinsed well. Dry off with a clean towel. Do not put any substances on your body afterward--such as powder, lotion, or perfume--unless you are told to do so by your health care provider. Only use lotions that are recommended by the manufacturer. Put on clean clothes or pajamas. If it is the night before your surgery, sleep in clean sheets. How to use CHG prepackaged cloths Only use CHG cloths as told by your health care provider, and follow the instructions on the label. Use the CHG cloth on clean, dry skin. Do not use the CHG cloth on your head or face unless your health care provider tells you to. When washing with the CHG cloth: Avoid your genital area. Avoid any areas of skin that have broken skin, cuts, or scrapes. Before surgery Follow these  steps when using a CHG cloth to clean before surgery (unless your health care provider gives you different instructions): Using the CHG cloth, vigorously scrub the part of your body where you will be having surgery. Scrub using a back-and-forth motion for 3 minutes. The area on your body should be completely wet with CHG when you are done scrubbing. Do not rinse. Discard the cloth and let the area air-dry. Do not put any substances on the area afterward, such as powder, lotion, or perfume. Put on clean clothes or pajamas. If it is the night before your surgery, sleep in clean sheets.  For general bathing Follow these steps when using CHG cloths for general bathing (unless your  health care provider gives you different instructions). Use a separate CHG cloth for each area of your body. Make sure you wash between any folds of skin and between your fingers and toes. Wash your body in the following order, switching to a new cloth after each step: The front of your neck, shoulders, and chest. Both of your arms, under your arms, and your hands. Your stomach and groin area, avoiding the genitals. Your right leg and foot. Your left leg and foot. The back of your neck, your back, and your buttocks. Do not rinse. Discard the cloth and let the area air-dry. Do not put any substances on your body afterward--such as powder, lotion, or perfume--unless you are told to do so by your health care provider. Only use lotions that are recommended by the manufacturer. Put on clean clothes or pajamas. Contact a health care provider if: Your skin gets irritated after scrubbing. You have questions about using your solution or cloth. You swallow any chlorhexidine. Call your local poison control center ([phone] in the [place] Get help right away if: Your eyes itch badly, or they become very red or swollen. Your skin itches badly and is red or swollen. Your hearing changes. You have trouble seeing. You have  swelling or tingling in your mouth or throat. You have trouble breathing. These symptoms may represent a serious problem that is an emergency. Do not wait to see if the symptoms will go away. Get medical help right away. Call your local emergency services (911 in the [place] Do not drive yourself to the hospital. Summary Chlorhexidine gluconate (CHG) is a germ-killing (antiseptic) solution that is used to clean the skin. Cleaning your skin with CHG may help to lower your risk for infection. You may be given CHG to use for bathing. It may be in a bottle or in a prepackaged cloth to use on your skin. Carefully follow your health care provider's instructions and the instructions on the product label. Do not use CHG if you have a chlorhexidine allergy. Contact your health care provider if your skin gets irritated after scrubbing. This information is not intended to replace advice given to you by your health care provider. Make sure you discuss any questions you have with your health care provider. Document Revised: 12/01/2021 Document Reviewed: 10/14/2020 Elsevier Patient Education  2023 [employer] Umbilical Hernia, Adult  A hernia is a bulge of tissue that pushes through an opening between muscles. An umbilical hernia happens in the abdomen, near the belly button (umbilicus). The hernia may contain tissues from the small intestine, large intestine, or fatty tissue covering the intestines. Umbilical hernias in adults tend to get worse over time, and they require surgical treatment. There are different types of umbilical hernias, including: Indirect hernia. This type is located just above or below the umbilicus. It is the most common type of umbilical hernia in adults. Direct hernia. This type forms through an opening formed by the umbilicus. Reducible hernia. This type of hernia comes and goes. It may be visible only when you strain, lift something heavy, or cough. This type of hernia can be pushed  back into the abdomen (reduced). Incarcerated hernia. This type traps abdominal tissue inside the hernia. This type of hernia cannot be reduced. Strangulated hernia. This type of hernia cuts off blood flow to the tissues inside the hernia. The tissues can start to die if this happens. This type of hernia requires emergency treatment. What are the causes? An umbilical hernia happens when  tissue inside the abdomen presses on a weak area of the abdominal muscles. What increases the risk? You may have a greater risk of this condition if you: Are obese. Have had several pregnancies. Have a buildup of fluid inside your abdomen. Have had surgery that weakens the abdominal muscles. What are the signs or symptoms? The main symptom of this condition is a painless bulge at or near the belly button. A reducible hernia may be visible only when you strain, lift something heavy, or cough. Other symptoms may include: Dull pain. A feeling of pressure. Symptoms of a strangulated hernia may include: Pain that gets increasingly worse. Nausea and vomiting. Pain when pressing on the hernia. Skin over the hernia becoming red or purple. Constipation. Blood in the stool. How is this diagnosed? This condition may be diagnosed based on: A physical exam. You may be asked to cough or strain while standing. These actions increase the pressure inside your abdomen and can force the hernia through the opening in your muscles. Your health care provider may try to reduce the hernia by pressing on it. Your symptoms and medical history. How is this treated? Surgery is the only treatment for an umbilical hernia. Surgery for a strangulated hernia is done as soon as possible. If you have a small hernia that is not incarcerated, you may need to lose weight before having surgery. Follow these instructions at home: Lose weight, if told by your health care provider. Do not try to push the hernia back in. Watch your hernia for  any changes in color or size. Tell your health care provider if any changes occur. You may need to avoid activities that increase pressure on your hernia. Do not lift anything that is heavier than 10 lb (4.5 kg), or the limit that you are told, until your health care provider says that it is safe. Take over-the-counter and prescription medicines only as told by your health care provider. Keep all follow-up visits. This is important. Contact a health care provider if: Your hernia gets larger. Your hernia becomes painful. Get help right away if: You develop sudden, severe pain near the area of your hernia. You have pain as well as nausea or vomiting. You have pain and the skin over your hernia changes color. You develop a fever or chills. Summary A hernia is a bulge of tissue that pushes through an opening between muscles. An umbilical hernia happens near the belly button. Surgery is the only treatment for an umbilical hernia. Do not try to push your hernia back in. Keep all follow-up visits. This is important. This information is not intended to replace advice given to you by your health care provider. Make sure you discuss any questions you have with your health care provider. Document Revised: 03/11/2020 Document Reviewed: 03/11/2020 Elsevier Patient Education  2023 [employer] Inc. Open Hernia Repair, Adult, Care After What can I expect after the procedure? After the procedure, it is common to have: Mild discomfort. Slight bruising. Mild swelling. Pain in the belly (abdomen). A small amount of blood from the cut from surgery (incision). Follow these instructions at home: Your doctor may give you more specific instructions. If you have problems, call your doctor. Medicines Take over-the-counter and prescription medicines only as told by your doctor. If told, take steps to prevent problems with pooping (constipation). You may need to: Drink enough fluid to keep your pee (urine) pale  yellow. Take medicines. You will be told what medicines to take. Eat foods that are  high in fiber. These include beans, whole grains, and fresh fruits and vegetables. Limit foods that are high in fat and sugar. These include fried or sweet foods. Ask your doctor if you should avoid driving or using machines while you are taking your medicine. Incision care  Follow instructions from your doctor about how to take care of your incision. Make sure you: Wash your hands with soap and water for at least 20 seconds before and after you change your bandage (dressing). If you cannot use soap and water, use hand sanitizer. Change your bandage. Leave stitches or skin glue in place for at least 2 weeks. Leave tape strips alone unless you are told to take them off. You may trim the edges of the tape strips if they curl up. Check your incision every day for signs of infection. Check for: More redness, swelling, or pain. More fluid or blood. Warmth. Pus or a bad smell. Wear loose, soft clothing while your incision heals. Activity  Rest as told by your doctor. Do not lift anything that is heavier than 10 lb (4.5 kg), or the limit that you are told. Do not play contact sports until your doctor says that this is safe. If you were given a sedative during your procedure, do not drive or use machines until your doctor says that it is safe. A sedative is a medicine that helps you relax. Return to your normal activities when your doctor says that it is safe. General instructions Do not take baths, swim, or use a hot tub. Ask your doctor about taking showers or sponge baths. Hold a pillow over your belly when you cough or sneeze. This helps with pain. Do not smoke or use any products that contain nicotine or tobacco. If you need help quitting, ask your doctor. Keep all follow-up visits. Contact a doctor if: You have any of these signs of infection in or around your incision: More redness, swelling, or  pain. More fluid or blood. Warmth. Pus. A bad smell. You have a fever or chills. You have blood in your poop (stool). You have not pooped (had a bowel movement) in 2-3 days. Medicine does not help your pain. Get help right away if: You have chest pain, or you are short of breath. You feel faint or light-headed. You have very bad pain. You vomit and your pain is worse. You have pain, swelling, or redness in a leg. These symptoms may be an emergency. Get help right away. Call your local emergency services (911 in the [place] Do not wait to see if the symptoms will go away. Do not drive yourself to the hospital. Summary After this procedure, it is common to have mild discomfort, slight bruising, and mild swelling. Follow instructions from your doctor about how to take care of your cut from surgery (incision). Check every day for signs of infection. Do not lift heavy objects or play contact sports until your doctor says it is safe. Return to your normal activities as told by your doctor. This information is not intended to replace advice given to you by your health care provider. Make sure you discuss any questions you have with your health care provider. Document Revised: 03/18/2020 Document Reviewed: 03/18/2020 Elsevier Patient Education  2023 [employer]. General Anesthesia, Adult, Care After The following information offers guidance on how to care for yourself after your procedure. Your health care provider may also give you more specific instructions. If you have problems or questions, contact your health care  provider. What can I expect after the procedure? After the procedure, it is common for people to: Have pain or discomfort at the IV site. Have nausea or vomiting. Have a sore throat or hoarseness. Have trouble concentrating. Feel cold or chills. Feel weak, sleepy, or tired (fatigue). Have soreness and body aches. These can affect parts of the body that were not involved in  surgery. Follow these instructions at home: For the time period you were told by your health care provider:  Rest. Do not participate in activities where you could fall or become injured. Do not drive or use machinery. Do not drink alcohol. Do not take sleeping pills or medicines that cause drowsiness. Do not make important decisions or sign legal documents. Do not take care of children on your own. General instructions Drink enough fluid to keep your urine pale yellow. If you have sleep apnea, surgery and certain medicines can increase your risk for breathing problems. Follow instructions from your health care provider about wearing your sleep device: Anytime you are sleeping, including during daytime naps. While taking prescription pain medicines, sleeping medicines, or medicines that make you drowsy. Return to your normal activities as told by your health care provider. Ask your health care provider what activities are safe for you. Take over-the-counter and prescription medicines only as told by your health care provider. Do not use any products that contain nicotine or tobacco. These products include cigarettes, chewing tobacco, and vaping devices, such as e-cigarettes. These can delay incision healing after surgery. If you need help quitting, ask your health care provider. Contact a health care provider if: You have nausea or vomiting that does not get better with medicine. You vomit every time you eat or drink. You have pain that does not get better with medicine. You cannot urinate or have bloody urine. You develop a skin rash. You have a fever. Get help right away if: You have trouble breathing. You have chest pain. You vomit blood. These symptoms may be an emergency. Get help right away. Call 911. Do not wait to see if the symptoms will go away. Do not drive yourself to the hospital. Summary After the procedure, it is common to have a sore throat, hoarseness, nausea,  vomiting, or to feel weak, sleepy, or fatigue. For the time period you were told by your health care provider, do not drive or use machinery. Get help right away if you have difficulty breathing, have chest pain, or vomit blood. These symptoms may be an emergency. This information is not intended to replace advice given to you by your health care provider. Make sure you discuss any questions you have with your health care provider. Document Revised: 10/31/2021 Document Reviewed: 10/31/2021 Elsevier Patient Education  2023 [employer].

## 2022-03-24 ENCOUNTER — Encounter (HOSPITAL_COMMUNITY)
Admission: RE | Admit: 2022-03-24 | Discharge: 2022-03-24 | Disposition: A | Discharge status: Home / Self Care | Payer: 59 | Source: Ambulatory Visit | Attending: General Surgery | Admitting: General Surgery

## 2022-03-24 ENCOUNTER — Encounter (HOSPITAL_COMMUNITY): Payer: Self-pay

## 2022-03-24 ENCOUNTER — Ambulatory Visit: Payer: 59 | Admitting: General Surgery

## 2022-03-24 ENCOUNTER — Other Ambulatory Visit: Payer: Self-pay

## 2022-03-24 DIAGNOSIS — Z01812 Encounter for preprocedural laboratory examination: Secondary | ICD-10-CM | POA: Diagnosis present

## 2022-03-24 HISTORY — DX: Unspecified asthma, uncomplicated: J45.909

## 2022-03-24 HISTORY — DX: Essential (primary) hypertension: I10

## 2022-03-26 ENCOUNTER — Ambulatory Visit (HOSPITAL_COMMUNITY): Payer: 59 | Admitting: Anesthesiology

## 2022-03-26 ENCOUNTER — Other Ambulatory Visit: Payer: Self-pay

## 2022-03-26 ENCOUNTER — Ambulatory Visit (HOSPITAL_BASED_OUTPATIENT_CLINIC_OR_DEPARTMENT_OTHER): Payer: 59 | Admitting: Anesthesiology

## 2022-03-26 ENCOUNTER — Encounter (HOSPITAL_COMMUNITY)
Admission: RE | Disposition: A | Discharge status: Home / Self Care | Payer: Self-pay | Source: Home / Self Care | Attending: General Surgery

## 2022-03-26 ENCOUNTER — Ambulatory Visit (HOSPITAL_COMMUNITY)
Admission: RE | Admit: 2022-03-26 | Discharge: 2022-03-26 | Disposition: A | Discharge status: Home / Self Care | Payer: 59 | Attending: General Surgery | Admitting: General Surgery

## 2022-03-26 DIAGNOSIS — I110 Hypertensive heart disease with heart failure: Secondary | ICD-10-CM | POA: Diagnosis not present

## 2022-03-26 DIAGNOSIS — F32A Depression, unspecified: Secondary | ICD-10-CM | POA: Diagnosis not present

## 2022-03-26 DIAGNOSIS — E039 Hypothyroidism, unspecified: Secondary | ICD-10-CM | POA: Diagnosis not present

## 2022-03-26 DIAGNOSIS — K429 Umbilical hernia without obstruction or gangrene: Secondary | ICD-10-CM | POA: Diagnosis present

## 2022-03-26 DIAGNOSIS — F419 Anxiety disorder, unspecified: Secondary | ICD-10-CM | POA: Diagnosis not present

## 2022-03-26 DIAGNOSIS — I509 Heart failure, unspecified: Secondary | ICD-10-CM | POA: Insufficient documentation

## 2022-03-26 HISTORY — PX: UMBILICAL HERNIA REPAIR: SHX196

## 2022-03-26 SURGERY — REPAIR, HERNIA, UMBILICAL, ADULT
Anesthesia: General | Site: Abdomen

## 2022-03-26 MED ORDER — ONDANSETRON HCL 4 MG/2ML IJ SOLN: 4.0000 mg | Freq: Once | INTRAMUSCULAR | Status: DC | PRN

## 2022-03-26 MED ORDER — CHLORHEXIDINE GLUCONATE 0.12 % MT SOLN
15.0000 mL | Freq: Once | OROMUCOSAL | Status: AC
  Administered 2022-03-26: 15 mL via OROMUCOSAL

## 2022-03-26 MED ORDER — LIDOCAINE HCL (CARDIAC) PF 100 MG/5ML IV SOSY
PREFILLED_SYRINGE | INTRAVENOUS | Status: DC | PRN
  Administered 2022-03-26: 50 mg via INTRAVENOUS

## 2022-03-26 MED ORDER — PROPOFOL 10 MG/ML IV BOLUS
INTRAVENOUS | Status: AC
  Filled 2022-03-26: qty 20

## 2022-03-26 MED ORDER — CHLORHEXIDINE GLUCONATE CLOTH 2 % EX PADS: 6.0000 | MEDICATED_PAD | Freq: Once | CUTANEOUS | Status: DC

## 2022-03-26 MED ORDER — KETOROLAC TROMETHAMINE 30 MG/ML IJ SOLN
INTRAMUSCULAR | Status: DC | PRN
  Administered 2022-03-26: 30 mg via INTRAVENOUS

## 2022-03-26 MED ORDER — MIDAZOLAM HCL 2 MG/2ML IJ SOLN
INTRAMUSCULAR | Status: AC
  Filled 2022-03-26: qty 2

## 2022-03-26 MED ORDER — FENTANYL CITRATE (PF) 100 MCG/2ML IJ SOLN
INTRAMUSCULAR | Status: DC | PRN
  Administered 2022-03-26 (×2): 50 ug via INTRAVENOUS

## 2022-03-26 MED ORDER — SODIUM CHLORIDE 0.9 % IR SOLN
Status: DC | PRN
  Administered 2022-03-26: 1000 mL

## 2022-03-26 MED ORDER — BUPIVACAINE LIPOSOME 1.3 % IJ SUSP
INTRAMUSCULAR | Status: DC | PRN
  Administered 2022-03-26: 20 mL

## 2022-03-26 MED ORDER — ONDANSETRON HCL 4 MG/2ML IJ SOLN
INTRAMUSCULAR | Status: DC | PRN
  Administered 2022-03-26: 4 mg via INTRAVENOUS

## 2022-03-26 MED ORDER — SUCCINYLCHOLINE CHLORIDE 200 MG/10ML IV SOSY
PREFILLED_SYRINGE | INTRAVENOUS | Status: DC | PRN
  Administered 2022-03-26: 100 mg via INTRAVENOUS

## 2022-03-26 MED ORDER — MEPERIDINE HCL 50 MG/ML IJ SOLN: 6.2500 mg | INTRAMUSCULAR | Status: DC | PRN

## 2022-03-26 MED ORDER — ORAL CARE MOUTH RINSE: 15.0000 mL | Freq: Once | OROMUCOSAL | Status: AC

## 2022-03-26 MED ORDER — LACTATED RINGERS IV SOLN
INTRAVENOUS | Status: DC
Start: 2022-03-26 — End: 2022-03-26

## 2022-03-26 MED ORDER — EPHEDRINE SULFATE (PRESSORS) 50 MG/ML IJ SOLN
INTRAMUSCULAR | Status: DC | PRN
  Administered 2022-03-26 (×2): 5 mg via INTRAVENOUS

## 2022-03-26 MED ORDER — ALBUTEROL SULFATE HFA 108 (90 BASE) MCG/ACT IN AERS
INHALATION_SPRAY | RESPIRATORY_TRACT | Status: DC | PRN
  Administered 2022-03-26: 4 via RESPIRATORY_TRACT

## 2022-03-26 MED ORDER — FENTANYL CITRATE (PF) 100 MCG/2ML IJ SOLN
INTRAMUSCULAR | Status: AC
  Filled 2022-03-26: qty 2

## 2022-03-26 MED ORDER — MIDAZOLAM HCL 5 MG/5ML IJ SOLN
INTRAMUSCULAR | Status: DC | PRN
  Administered 2022-03-26: 2 mg via INTRAVENOUS

## 2022-03-26 MED ORDER — OXYCODONE-ACETAMINOPHEN 5-325 MG PO TABS: 1.0000 | ORAL_TABLET | ORAL | 0 refills | Status: DC | PRN

## 2022-03-26 MED ORDER — BUPIVACAINE LIPOSOME 1.3 % IJ SUSP
INTRAMUSCULAR | Status: AC
  Filled 2022-03-26: qty 20

## 2022-03-26 MED ORDER — PROPOFOL 10 MG/ML IV BOLUS
INTRAVENOUS | Status: DC | PRN
  Administered 2022-03-26: 200 mg via INTRAVENOUS

## 2022-03-26 MED ORDER — ALBUTEROL SULFATE HFA 108 (90 BASE) MCG/ACT IN AERS
INHALATION_SPRAY | RESPIRATORY_TRACT | Status: AC
  Filled 2022-03-26: qty 6.7

## 2022-03-26 MED ORDER — CEFAZOLIN SODIUM-DEXTROSE 2-4 GM/100ML-% IV SOLN
2.0000 g | INTRAVENOUS | Status: AC
Start: 2022-03-26 — End: 2022-03-26
  Administered 2022-03-26: 2 g via INTRAVENOUS

## 2022-03-26 MED ORDER — CEFAZOLIN SODIUM-DEXTROSE 2-4 GM/100ML-% IV SOLN
INTRAVENOUS | Status: AC
  Filled 2022-03-26: qty 100

## 2022-03-26 MED ORDER — FENTANYL CITRATE PF 50 MCG/ML IJ SOSY: 25.0000 ug | PREFILLED_SYRINGE | INTRAMUSCULAR | Status: DC | PRN

## 2022-03-26 MED ORDER — ROCURONIUM BROMIDE 10 MG/ML (PF) SYRINGE
PREFILLED_SYRINGE | INTRAVENOUS | Status: DC | PRN
  Administered 2022-03-26: 10 mg via INTRAVENOUS

## 2022-03-26 MED ORDER — DEXAMETHASONE SODIUM PHOSPHATE 4 MG/ML IJ SOLN
INTRAMUSCULAR | Status: DC | PRN
  Administered 2022-03-26: 4 mg via INTRAVENOUS

## 2022-03-26 SURGICAL SUPPLY — 36 items
ADH SKN CLS APL DERMABOND .7 (GAUZE/BANDAGES/DRESSINGS) ×1
APL PRP STRL LF DISP 70% ISPRP (MISCELLANEOUS) ×1
BLADE SURG SZ11 CARB STEEL (BLADE) ×2 IMPLANT
CHLORAPREP W/TINT 26 (MISCELLANEOUS) ×2 IMPLANT
CLOTH BEACON ORANGE TIMEOUT ST (SAFETY) ×2 IMPLANT
COVER LIGHT HANDLE STERIS (MISCELLANEOUS) ×4 IMPLANT
DERMABOND ADVANCED (GAUZE/BANDAGES/DRESSINGS) ×1
DERMABOND ADVANCED .7 DNX12 (GAUZE/BANDAGES/DRESSINGS) ×1 IMPLANT
ELECT REM PT RETURN 9FT ADLT (ELECTROSURGICAL) ×2
ELECTRODE REM PT RTRN 9FT ADLT (ELECTROSURGICAL) ×1 IMPLANT
GLOVE BIO SURGEON STRL SZ7 (GLOVE) ×2 IMPLANT
GLOVE BIOGEL PI IND STRL 7.0 (GLOVE) ×2 IMPLANT
GLOVE BIOGEL PI IND STRL 7.5 (GLOVE) IMPLANT
GLOVE BIOGEL PI INDICATOR 7.0 (GLOVE) ×2
GLOVE BIOGEL PI INDICATOR 7.5 (GLOVE) ×1
GLOVE SURG SS PI 7.5 STRL IVOR (GLOVE) ×2 IMPLANT
GOWN STRL REUS W/TWL LRG LVL3 (GOWN DISPOSABLE) ×5 IMPLANT
INST SET MINOR GENERAL (KITS) ×2 IMPLANT
KIT TURNOVER KIT A (KITS) ×2 IMPLANT
LIGASURE IMPACT 36 18CM CVD LR (INSTRUMENTS) ×1 IMPLANT
MANIFOLD NEPTUNE II (INSTRUMENTS) ×2 IMPLANT
MESH VENTRALEX ST 1-7/10 CRC S (Mesh General) ×1 IMPLANT
NDL HYPO 18GX1.5 BLUNT FILL (NEEDLE) ×1 IMPLANT
NDL HYPO 21X1.5 SAFETY (NEEDLE) ×1 IMPLANT
NEEDLE HYPO 18GX1.5 BLUNT FILL (NEEDLE) ×2 IMPLANT
NEEDLE HYPO 21X1.5 SAFETY (NEEDLE) ×2 IMPLANT
NS IRRIG 1000ML POUR BTL (IV SOLUTION) ×2 IMPLANT
PACK MINOR (CUSTOM PROCEDURE TRAY) ×2 IMPLANT
PAD ARMBOARD 7.5X6 YLW CONV (MISCELLANEOUS) ×2 IMPLANT
SET BASIN LINEN APH (SET/KITS/TRAYS/PACK) ×2 IMPLANT
SUT ETHIBOND NAB MO 7 #0 18IN (SUTURE) ×2 IMPLANT
SUT MNCRL AB 4-0 PS2 18 (SUTURE) ×2 IMPLANT
SUT VIC AB 2-0 CT2 27 (SUTURE) ×2 IMPLANT
SUT VIC AB 3-0 SH 27 (SUTURE) ×2
SUT VIC AB 3-0 SH 27X BRD (SUTURE) ×1 IMPLANT
SYR 20ML LL LF (SYRINGE) ×4 IMPLANT

## 2022-03-26 NOTE — Anesthesia Postprocedure Evaluation (Signed)
Anesthesia Post Note  Patient: Christian Ellison Jim  Procedure(s) Performed: HERNIA REPAIR UMBILICAL ADULT WITH MESH (Abdomen)  Patient location during evaluation: Phase II Anesthesia Type: General Level of consciousness: awake and alert and oriented Pain management: pain level controlled Vital Signs Assessment: post-procedure vital signs reviewed and stable Respiratory status: spontaneous breathing, nonlabored ventilation and respiratory function stable Cardiovascular status: blood pressure returned to baseline and stable Postop Assessment: no apparent nausea or vomiting Anesthetic complications: no   No notable events documented.   Last Vitals:  Vitals:   03/26/22 0845 03/26/22 0901  BP: (!) 181/154 (!) 140/84  Pulse: 80 78  Resp: 16 18  Temp:  36.4 C  SpO2: 95% 92%    Last Pain:  Vitals:   03/26/22 0901  TempSrc: Oral  PainSc: 1                  Vandy Tsuchiya C Brytani Voth

## 2022-03-26 NOTE — Interval H&P Note (Signed)
History and Physical Interval Note:  03/26/2022 7:04 AM  Christian Ellison  has presented today for surgery, with the diagnosis of UMBILICAL HERNIA, <3 CM.  The various methods of treatment have been discussed with the patient and family. After consideration of risks, benefits and other options for treatment, the patient has consented to  Procedure(s): HERNIA REPAIR UMBILICAL ADULT (N/A) as a surgical intervention.  The patient's history has been reviewed, patient examined, no change in status, stable for surgery.  I have reviewed the patient's chart and labs.  Questions were answered to the patient's satisfaction.     Franky Macho

## 2022-03-26 NOTE — Transfer of Care (Signed)
Immediate Anesthesia Transfer of Care Note  Patient: Christian Ellison  Procedure(s) Performed: HERNIA REPAIR UMBILICAL ADULT (Abdomen)  Patient Location: PACU  Anesthesia Type:General  Level of Consciousness: awake, drowsy and patient cooperative  Airway & Oxygen Therapy: Patient Spontanous Breathing and Patient connected to face mask oxygen  Post-op Assessment: Report given to RN and Post -op Vital signs reviewed and stable  Post vital signs: Reviewed and stable  Last Vitals:  Vitals Value Taken Time  BP 153/89 03/26/22 0825  Temp 97.5   Pulse 84 03/26/22 0829  Resp 25 03/26/22 0829  SpO2 100 % 03/26/22 0829  Vitals shown include unvalidated device data.  Last Pain:  Vitals:   03/26/22 0656  TempSrc: Oral  PainSc: 0-No pain      Patients Stated Pain Goal: 5 (03/26/22 0656)  Complications: No notable events documented.

## 2022-03-26 NOTE — Anesthesia Preprocedure Evaluation (Signed)
Anesthesia Evaluation  Patient identified by MRN, date of birth, ID band Patient awake    Reviewed: Allergy & Precautions, NPO status , Patient's Chart, lab work & pertinent test results  Airway Mallampati: III  TM Distance: >3 FB Neck ROM: Full    Dental  (+) Dental Advisory Given, Caps   Pulmonary asthma , sleep apnea and Continuous Positive Airway Pressure Ventilation , pneumonia (recurrent aspiration pneumonia), resolved, neg COPD,  COPD inhaler,    Pulmonary exam normal breath sounds clear to auscultation       Cardiovascular hypertension, Pt. on medications +CHF  Normal cardiovascular exam Rhythm:Regular Rate:Normal  02/2018 Blood pressure demonstrated a normal response to exercise. Excellent exercise tolerance. There was no ST segment deviation noted during stress. Duke treadmill score portends a low risk for cardiac events.    Neuro/Psych PSYCHIATRIC DISORDERS (short term memory loss) Anxiety Depression  Neuromuscular disease    GI/Hepatic GERD (resolved after surgery for GERD)  Medicated and Controlled,(+)     substance abuse (alcohol abuse in remission)  alcohol use and marijuana use, Hepatitis -, C  Endo/Other  Hypothyroidism   Renal/GU negative Renal ROS  negative genitourinary   Musculoskeletal negative musculoskeletal ROS (+) narcotic dependent  Abdominal   Peds negative pediatric ROS (+)  Hematology negative hematology ROS (+)   Anesthesia Other Findings   Reproductive/Obstetrics negative OB ROS                            Anesthesia Physical Anesthesia Plan  ASA: 3  Anesthesia Plan: General   Post-op Pain Management: Dilaudid IV   Induction: Intravenous  PONV Risk Score and Plan: 3 and Ondansetron, Dexamethasone and Metaclopromide  Airway Management Planned: Oral ETT and Video Laryngoscope Planned  Additional Equipment:   Intra-op Plan:   Post-operative  Plan: Extubation in OR  Informed Consent: I have reviewed the patients History and Physical, chart, labs and discussed the procedure including the risks, benefits and alternatives for the proposed anesthesia with the patient or authorized representative who has indicated his/her understanding and acceptance.     Dental advisory given  Plan Discussed with: CRNA and Surgeon  Anesthesia Plan Comments:        Anesthesia Quick Evaluation

## 2022-03-26 NOTE — Op Note (Signed)
Patient:  Christian Ellison  DOB:  12-Oct-1960  MRN:  [id number]   Preop Diagnosis: Umbilical hernia  Postop Diagnosis: Same  Procedure: Umbilical herniorrhaphy with mesh  Surgeon: Franky Macho, MD  Anes: General endotracheal  Indications: Patient is a 61 year old white male who presents with a symptomatic umbilical hernia.  The risks and benefits of the procedure including bleeding, infection, mesh use, and the possibility of recurrence of the hernia were fully explained to the patient, who gave informed consent.  Procedure note: The patient was placed in the supine position.  After induction of general endotracheal anesthesia, the abdomen was prepped and draped using usual sterile technique with ChloraPrep.  Surgical site confirmation was performed.  An infraumbilical incision was made down to the fascia.  The umbilicus was freed away from the underlying hernia sac.  The hernia sac was excised and omentum was noted to be present.  I did do a limited resection of the omentum in order to reduce the hernia.  The resultant hernia defect was approximately 2 cm in its greatest diameter.  I did palpate the anterior abdominal wall and no other defects were noted.  A 4.7 cm Bard Ventralax ST patch was then inserted and secured to the fascia using 0 Ethibond interrupted sutures.  The overlying fascia was reapproximated transversely using 0 Ethibond interrupted sutures.  The base of the umbilicus was secured back to the fascia using a 2-0 Vicryl suture.  The subcutaneous layer was reapproximated using 3-0 Vicryl interrupted suture.  Exparel was instilled into the surrounding wound.  The skin was closed using a 4-0 Monocryl subcuticular suture.  Dermabond was applied.  All tape and needle counts were correct at the end of the procedure.  The patient was extubated in the operating room and transferred to PACU in stable condition.  Complications: None  EBL: Minimal  Specimen: None

## 2022-03-26 NOTE — Anesthesia Procedure Notes (Signed)
Procedure Name: Intubation Date/Time: 03/26/2022 7:36 AM  Performed by: Alphonse Guild, RNPre-anesthesia Checklist: Patient identified, Emergency Drugs available, Suction available and Patient being monitored Patient Re-evaluated:Patient Re-evaluated prior to induction Oxygen Delivery Method: Circle system utilized Preoxygenation: Pre-oxygenation with 100% oxygen Induction Type: IV induction, Cricoid Pressure applied and Rapid sequence Laryngoscope Size: Glidescope and 3 Grade View: Grade I Tube type: Oral Tube size: 7.5 mm Number of attempts: 1 Airway Equipment and Method: Stylet and Video-laryngoscopy Placement Confirmation: ETT inserted through vocal cords under direct vision, positive ETCO2 and breath sounds checked- equal and bilateral Secured at: 23 cm Tube secured with: Tape Dental Injury: Teeth and Oropharynx as per pre-operative assessment

## 2022-03-27 ENCOUNTER — Encounter (HOSPITAL_COMMUNITY): Payer: Self-pay | Admitting: General Surgery

## 2022-04-07 ENCOUNTER — Ambulatory Visit (INDEPENDENT_AMBULATORY_CARE_PROVIDER_SITE_OTHER): Payer: 59 | Admitting: General Surgery

## 2022-04-07 DIAGNOSIS — Z09 Encounter for follow-up examination after completed treatment for conditions other than malignant neoplasm: Secondary | ICD-10-CM

## 2022-04-07 NOTE — Progress Notes (Signed)
Attempted virtual postop telephone visit x2.  Left message with patient.

## 2022-04-08 ENCOUNTER — Telehealth: Payer: Self-pay | Admitting: *Deleted

## 2022-04-08 NOTE — Telephone Encounter (Signed)
Received call from patient [phone]~ telephone.   Patient reports that he did miss virtual appointment with Dr. Lovell Sheehan on 04/07/2022. States that he is doing well and is having no issues from hernia repair.   Provider to be made aware.

## 2022-07-23 ENCOUNTER — Ambulatory Visit
Admission: EM | Admit: 2022-07-23 | Discharge: 2022-07-23 | Disposition: A | Discharge status: Home / Self Care | Payer: 59 | Attending: Family Medicine | Admitting: Family Medicine

## 2022-07-23 DIAGNOSIS — J22 Unspecified acute lower respiratory infection: Secondary | ICD-10-CM | POA: Diagnosis not present

## 2022-07-23 MED ORDER — GUAIFENESIN ER 600 MG PO TB12: 600.0000 mg | ORAL_TABLET | Freq: Two times a day (BID) | ORAL | 0 refills | Status: DC | PRN

## 2022-07-23 MED ORDER — PROMETHAZINE-DM 6.25-15 MG/5ML PO SYRP: 5.0000 mL | ORAL_SOLUTION | Freq: Four times a day (QID) | ORAL | 0 refills | Status: DC | PRN

## 2022-07-23 MED ORDER — DOXYCYCLINE HYCLATE 100 MG PO CAPS: 100.0000 mg | ORAL_CAPSULE | Freq: Two times a day (BID) | ORAL | 0 refills | Status: DC

## 2022-07-23 NOTE — ED Provider Notes (Signed)
[facility]    CSN: [id number] Arrival date & time: 07/23/22  1446      History   Chief Complaint No chief complaint on file.   HPI Christian Ellison is a 61 y.o. male.   Presenting today with a week and a half of nasal congestion, sinus pressure, productive cough now worsening into shortness of breath, fatigue.  Denies fever, chest pain, abdominal pain, nausea vomiting or diarrhea.  Trying Flonase, vapor rubs with no relief.  Has a complicated past medical history to include recurrent aspiration pneumonia, asthma on albuterol as needed which she does not take often because it makes him too jittery, CHF with history of pulmonary edema.  No known sick contacts recently.    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    Hypertension    Opiate abuse, continuous (HCC)    Pulmonary edema 02/02/2018   Sepsis due to pneumonia (HCC) 03/11/2019   Sleep apnea    CPAP nightly    Patient Active Problem List   Diagnosis Date Noted   Umbilical hernia 04/17/2021   Anxiety 04/17/2021   Insomnia 03/13/2021   PTSD (post-traumatic stress disorder) 03/13/2021   Tachycardia 03/13/2021   Hyperbilirubinemia 03/13/2021   Seasonal allergies 01/16/2021   Recurrent aspiration pneumonia (HCC) 12/16/2020   Alcohol abuse, in remission 08/30/2020   Gastroesophageal reflux disease with esophagitis 08/30/2020   Benzodiazepine dependence (HCC) 06/12/2020   History of substance abuse (HCC) 06/12/2020   Obstructive sleep apnea 06/12/2020   Hypothyroidism 03/11/2019   Essential hypertension 02/02/2018   CHF (congestive heart failure) (HCC) 02/02/2018   Esophageal dysphagia 02/02/2018   Esophagogastric junction outflow obstruction 02/02/2018   History of  hepatitis C 01/17/2013   Depression 01/17/2013   Anxiety with depression 01/17/2013    Past Surgical History:  Procedure Laterality Date   BIOPSY  03/18/2018   Procedure: BIOPSY;  Surgeon: Malissa Hippo, MD;  Location: [facility];  Service: Endoscopy;;  gastric    COLONOSCOPY N/A 11/22/2013   Dr. Karilyn Cota: Normal except for hemorrhoids.  Next colonoscopy 10 years.   ESOPHAGEAL DILATION N/A 03/18/2018   Procedure: ESOPHAGEAL DILATION;  Surgeon: Malissa Hippo, MD;  Location: [facility];  Service: Endoscopy;  Laterality: N/A;   ESOPHAGEAL DILATION N/A 08/07/2020   Procedure: ESOPHAGEAL DILATION;  Surgeon: Malissa Hippo, MD;  Location: [facility];  Service: Endoscopy;  Laterality: N/A;   ESOPHAGEAL MANOMETRY N/A 06/15/2018   Procedure: ESOPHAGEAL MANOMETRY (EM);  Surgeon: Napoleon Form, MD;  Location: [facility];  Service: Endoscopy;  Laterality: N/A;   ESOPHAGOGASTRODUODENOSCOPY (EGD) WITH PROPOFOL N/A 03/18/2018   Dr. Karilyn Cota: Benign-appearing mild esophageal stenosis proximal to the GE J, status post dilation.  2 cm hiatal hernia.  Gastritis, biopsies negative for H. pylori   ESOPHAGOGASTRODUODENOSCOPY (EGD) WITH PROPOFOL N/A 08/07/2020   Procedure: ESOPHAGOGASTRODUODENOSCOPY (EGD) WITH PROPOFOL;  Surgeon: Malissa Hippo, MD;  Location: [facility];  Service: Endoscopy;  Laterality: N/A;   Fracture Right Leg     Patient has rod/screws in this leg   ORIF CALCANEOUS FRACTURE Left 07/04/2020   Procedure: OPEN REDUCTION INTERNAL FIXATION (ORIF) LEFT TALUS AND CALCANEOUS FRACTURE, SUBTALAR JOINT ARTHROTOMY WITH  REMOVAL OF LOOSE BODIES, TALONAVICULAR JOINT ARTHOTOMY WITH REMOVAL OF LOOSE BODIES;  Surgeon: Terance HartAdair, Christopher R, MD;  [facility];  Service: Orthopedics;  Laterality: Left;   Rotor Cuff  2012   Right Shoulder   UMBILICAL HERNIA REPAIR N/A 03/26/2022   Procedure: HERNIA REPAIR UMBILICAL ADULT WITH MESH;  Surgeon: Franky MachoJenkins, Mark, MD;  Location: [facility];  Service:  General;  Laterality: N/A;       Home Medications    Prior to Admission medications   Medication Sig Start Date End Date Taking? Authorizing Provider  doxycycline (VIBRAMYCIN) 100 MG capsule Take 1 capsule (100 mg total) by mouth 2 (two) times daily. 07/23/22  Yes Particia NearingLane, Vicki Pasqual Elizabeth, PA-C  guaiFENesin (MUCINEX) 600 MG 12 hr tablet Take 1 tablet (600 mg total) by mouth 2 (two) times daily as needed. 07/23/22  Yes Particia NearingLane, Dalana Pfahler Elizabeth, PA-C  promethazine-dextromethorphan (PROMETHAZINE-DM) 6.25-15 MG/5ML syrup Take 5 mLs by mouth 4 (four) times daily as needed. 07/23/22  Yes Particia NearingLane, Tiyonna Sardinha Elizabeth, PA-C  acetaminophen (TYLENOL) 325 MG tablet Take 2 tablets (650 mg total) by mouth every 6 (six) hours as needed for mild pain (or Fever >/= 101). 05/17/19   Shon HaleEmokpae, Courage, MD  albuterol (PROVENTIL) (2.5 MG/3ML) 0.083% nebulizer solution Take 3 mLs (2.5 mg total) by nebulization every 4 (four) hours as needed for wheezing. 12/26/20   Laural BenesJohnson, Clanford L, MD  albuterol (VENTOLIN HFA) 108 (90 Base) MCG/ACT inhaler Inhale 2 puffs into the lungs every 6 (six) hours as needed for wheezing or shortness of breath. 01/27/21   Anabel HalonPatel, Rutwik K, MD  ALPRAZolam Prudy Feeler(XANAX) 1 MG tablet Take 1 mg by mouth in the morning, at noon, in the evening, and at bedtime. 10/22/19   [provider]  cholecalciferol (VITAMIN D3) 25 MCG (1000 UNIT) tablet Take 1,000 Units by mouth daily.    [provider]  Cyanocobalamin (B-12 PO) Take by mouth.    [provider]  escitalopram (LEXAPRO) 20 MG tablet Take 20 mg by mouth at bedtime. 02/04/22   [provider]  fluticasone (FLONASE) 50 MCG/ACT nasal spray Place 2 sprays into both nostrils daily.    [provider]  levothyroxine (SYNTHROID) 100 MCG tablet Take 100 mcg by mouth daily before breakfast. 02/04/22   [provider]  levothyroxine (SYNTHROID) 25 MCG tablet TAKE 1 TABLET BY MOUTH EVERY DAY Patient not taking: Reported on  03/23/2022 12/17/20   Anabel HalonPatel, Rutwik K, MD  losartan (COZAAR) 50 MG tablet Take 50 mg by mouth daily. 11/05/21   [provider]  naphazoline-pheniramine (NAPHCON-A) 0.025-0.3 % ophthalmic solution Place 1 drop into both eyes 4 (four) times daily as needed for eye irritation or allergies.    [provider]  NON FORMULARY Pt uses a cpap nightly    [provider]  oxyCODONE-acetaminophen (PERCOCET) 5-325 MG tablet Take 1 tablet by mouth every 4 (four) hours as needed for severe pain. 03/26/22 03/26/23  Franky MachoJenkins, Mark, MD  pantoprazole (PROTONIX) 40 MG tablet Take 1 tablet (40 mg total) by mouth 2 (two) times daily. Patient taking differently: Take 40 mg by mouth daily. 01/16/21 03/23/22  Anabel HalonPatel, Rutwik K, MD  QUEtiapine (SEROQUEL) 25 MG tablet Take 25 mg by mouth at bedtime. 03/02/22   [provider]    Family History Family History  Problem Relation Age of Onset   Breast cancer Mother    Kidney failure Mother    Stroke Mother    Colon cancer Father    Bladder  Cancer Father    Prostate cancer Father    Hypertension Sister    Hypothyroidism Sister    Healthy Sister    Healthy Daughter    Healthy Son    Healthy Son     Social History Social History   Tobacco Use   Smoking status: Never   Smokeless tobacco: Never  Vaping Use   Vaping Use: Never used  Substance Use Topics   Alcohol use: Not Currently   Drug use: Not Currently    Types: Benzodiazepines    Comment: opiates     Allergies   Hydrocodone and Other   Review of Systems Review of Systems Per HPI  Physical Exam Triage Vital Signs ED Triage Vitals  Enc Vitals Group     BP 07/23/22 1525 124/80     Pulse Rate 07/23/22 1525 79     Resp 07/23/22 1525 20     Temp 07/23/22 1525 98.5 F (36.9 C)     Temp src --      SpO2 07/23/22 1525 91 %     Weight --      Height --      Head Circumference --      Peak Flow --      Pain Score 07/23/22 1529 0     Pain Loc --      Pain Edu? --       Excl. in GC? --    No data found.  Updated Vital Signs BP 124/80 (BP Location: Right Arm)   Pulse 79   Temp 98.5 F (36.9 C)   Resp 20   SpO2 91%   Visual Acuity Right Eye Distance:   Left Eye Distance:   Bilateral Distance:    Right Eye Near:   Left Eye Near:    Bilateral Near:     Physical Exam Vitals and nursing note reviewed.  Constitutional:      Appearance: He is well-developed.  HENT:     Head: Atraumatic.     Right Ear: External ear normal.     Left Ear: External ear normal.     Nose: Congestion present.     Mouth/Throat:     Pharynx: Posterior oropharyngeal erythema present. No oropharyngeal exudate.  Eyes:     Conjunctiva/sclera: Conjunctivae normal.     Pupils: Pupils are equal, round, and reactive to light.  Cardiovascular:     Rate and Rhythm: Normal rate and regular rhythm.  Pulmonary:     Effort: Pulmonary effort is normal. No respiratory distress.     Breath sounds: Wheezing present. No rales.  Musculoskeletal:        General: Normal range of motion.     Cervical back: Normal range of motion and neck supple.  Lymphadenopathy:     Cervical: No cervical adenopathy.  Skin:    General: Skin is warm and dry.  Neurological:     Mental Status: He is alert and oriented to person, place, and time.  Psychiatric:        Behavior: Behavior normal.      UC Treatments / Results  Labs (all labs ordered are listed, but only abnormal results are displayed) Labs Reviewed - No data to display  EKG   Radiology No results found.  Procedures Procedures (including critical care time)  Medications Ordered in UC Medications - No data to display  Initial Impression / Assessment and Plan / UC Course  I have reviewed the triage vital signs and the nursing notes.  Pertinent labs & imaging results that were available during my care of the patient were reviewed by me and considered in my medical decision making (see chart for details).     Given  duration, worsening course and propensity toward pneumonia will cover with doxycycline, Mucinex, Phenergan DM and continue albuterol as needed.  Return for worsening symptoms.  Final Clinical Impressions(s) / UC Diagnoses   Final diagnoses:  Lower respiratory infection   Discharge Instructions   None    ED Prescriptions     Medication Sig Dispense Auth. Provider   doxycycline (VIBRAMYCIN) 100 MG capsule Take 1 capsule (100 mg total) by mouth 2 (two) times daily. 20 capsule Particia Nearing, PA-C   guaiFENesin (MUCINEX) 600 MG 12 hr tablet Take 1 tablet (600 mg total) by mouth 2 (two) times daily as needed. 20 tablet Particia Nearing, [place]   promethazine-dextromethorphan (PROMETHAZINE-DM) 6.25-15 MG/5ML syrup Take 5 mLs by mouth 4 (four) times daily as needed. 100 mL Particia Nearing, [place]      PDMP not reviewed this encounter.   Particia Nearing, [place] 07/23/22 1551

## 2022-07-23 NOTE — ED Triage Notes (Signed)
Pt reports he think its an infection in his chest, no appetite says "he can't sleep for years now" for a week and a half. Chest feels heavy. Took flonase, corricidin but no release.

## 2022-10-14 ENCOUNTER — Ambulatory Visit: Payer: 59 | Admitting: Family Medicine

## 2022-10-15 ENCOUNTER — Encounter: Payer: Self-pay | Admitting: Radiology

## 2022-12-09 ENCOUNTER — Ambulatory Visit: Payer: 59 | Admitting: Family Medicine

## 2022-12-30 ENCOUNTER — Ambulatory Visit (INDEPENDENT_AMBULATORY_CARE_PROVIDER_SITE_OTHER): Payer: 59 | Admitting: Family Medicine

## 2022-12-30 VITALS — BP 142/100 | HR 74 | Temp 98.1°F | Ht 72.0 in | Wt 233.0 lb

## 2022-12-30 DIAGNOSIS — Z8619 Personal history of other infectious and parasitic diseases: Secondary | ICD-10-CM

## 2022-12-30 DIAGNOSIS — K2100 Gastro-esophageal reflux disease with esophagitis, without bleeding: Secondary | ICD-10-CM

## 2022-12-30 DIAGNOSIS — J690 Pneumonitis due to inhalation of food and vomit: Secondary | ICD-10-CM

## 2022-12-30 DIAGNOSIS — Z130 Encounter for screening for diseases of the blood and blood-forming organs and certain disorders involving the immune mechanism: Secondary | ICD-10-CM

## 2022-12-30 DIAGNOSIS — K222 Esophageal obstruction: Secondary | ICD-10-CM

## 2022-12-30 DIAGNOSIS — Z13220 Encounter for screening for lipoid disorders: Secondary | ICD-10-CM

## 2022-12-30 DIAGNOSIS — Z125 Encounter for screening for malignant neoplasm of prostate: Secondary | ICD-10-CM

## 2022-12-30 DIAGNOSIS — E039 Hypothyroidism, unspecified: Secondary | ICD-10-CM

## 2022-12-30 DIAGNOSIS — I10 Essential (primary) hypertension: Secondary | ICD-10-CM

## 2022-12-30 MED ORDER — PANTOPRAZOLE SODIUM 40 MG PO TBEC: 40.0000 mg | DELAYED_RELEASE_TABLET | Freq: Two times a day (BID) | ORAL | 1 refills | Status: DC

## 2022-12-30 MED ORDER — LOSARTAN POTASSIUM 50 MG PO TABS: 50.0000 mg | ORAL_TABLET | Freq: Every day | ORAL | 1 refills | Status: DC

## 2022-12-30 MED ORDER — LEVOTHYROXINE SODIUM 100 MCG PO TABS: 100.0000 ug | ORAL_TABLET | Freq: Every day | ORAL | 1 refills | Status: DC

## 2022-12-30 NOTE — Patient Instructions (Signed)
Labs at your convenience.  Medications refilled.  Check BP at home.   Follow up in 3 months.

## 2022-12-31 LAB — CMP14+EGFR
AST: 33 IU/L (ref 0–40)
Bilirubin Total: 0.3 mg/dL (ref 0.0–1.2)
Creatinine, Ser: 0.98 mg/dL (ref 0.76–1.27)
Globulin, Total: 3 g/dL (ref 1.5–4.5)
Sodium: 145 mmol/L — ABNORMAL HIGH (ref 134–144)
eGFR: 88 mL/min/{1.73_m2} (ref >59)

## 2022-12-31 LAB — LIPID PANEL
Chol/HDL Ratio: 5.5 ratio — ABNORMAL HIGH (ref 0.0–5.0)
Triglycerides: 186 mg/dL — ABNORMAL HIGH (ref 0–149)

## 2022-12-31 LAB — PSA: Prostate Specific Ag, Serum: 2 ng/mL (ref 0.0–4.0)

## 2022-12-31 LAB — CBC: WBC: 6.5 10*3/uL (ref 3.4–10.8)

## 2022-12-31 LAB — HCV RNA QUANT

## 2022-12-31 MED ORDER — LEVOTHYROXINE SODIUM 112 MCG PO TABS: 112.0000 ug | ORAL_TABLET | Freq: Every day | ORAL | 3 refills | Status: AC

## 2022-12-31 NOTE — Assessment & Plan Note (Signed)
Stable. Continue Protonix. Refilled today. 

## 2022-12-31 NOTE — Progress Notes (Signed)
Subjective:  Patient ID: Christian Ellison, male    DOB: 1960/12/03  Age: 62 y.o. MRN: [id number]  CC: Chief Complaint  Patient presents with   Establish Care    HPI:  62 year old male with an extensive history presents to establish care.  Patient's blood pressure is mildly elevated today.  He is on losartan.  Advised that he should check his blood pressures at home.  Has hypothyroidism currently on levothyroxine 100 mcg daily.  Needs labs.  GERD stable on Protonix.  Needs refill.  Patient Active Problem List   Diagnosis Date Noted   Insomnia 03/13/2021   PTSD (post-traumatic stress disorder) 03/13/2021   Seasonal allergies 01/16/2021   Recurrent aspiration pneumonia (HCC) 12/16/2020   Alcohol abuse, in remission 08/30/2020   Gastroesophageal reflux disease with esophagitis 08/30/2020   Benzodiazepine dependence (HCC) 06/12/2020   History of substance abuse (HCC) 06/12/2020   Obstructive sleep apnea 06/12/2020   Hypothyroidism 03/11/2019   Essential hypertension 02/02/2018   Esophageal dysphagia 02/02/2018   Esophagogastric junction outflow obstruction 02/02/2018   History of hepatitis C 01/17/2013   Anxiety with depression 01/17/2013    Social Hx   Social History   Socioeconomic History   Marital status: Married    Spouse name: Not on file   Number of children: Not on file   Years of education: Not on file   Highest education level: Not on file  Occupational History   Occupation: unemployed  Tobacco Use   Smoking status: Never   Smokeless tobacco: Never  Vaping Use   Vaping Use: Never used  Substance and Sexual Activity   Alcohol use: Not Currently   Drug use: Not Currently    Types: Benzodiazepines    Comment: opiates   Sexual activity: Not on file  Other Topics Concern   Not on file  Social History Narrative   Not on file   Social Determinants of Health   Financial Resource Strain: Not on file  Food Insecurity: Not on file  Transportation  Needs: Not on file  Physical Activity: Not on file  Stress: Not on file  Social Connections: Not on file    Review of Systems Per HPI  Objective:  BP (!) 142/100   Pulse 74   Temp 98.1 F (36.7 C)   Ht 6' (1.829 m)   Wt 233 lb (105.7 kg)   SpO2 97%   BMI 31.60 kg/m      12/30/2022    3:09 PM 07/23/2022    3:25 PM 03/26/2022    9:01 AM  BP/Weight  Systolic BP 142 124 140  Diastolic BP 100 80 84  Wt. (Lbs) 233    BMI 31.6 kg/m2      Physical Exam Vitals and nursing note reviewed.  Constitutional:      General: He is not in acute distress.    Appearance: Normal appearance.  HENT:     Head: Normocephalic and atraumatic.  Eyes:     General:        Right eye: No discharge.        Left eye: No discharge.     Conjunctiva/sclera: Conjunctivae normal.  Cardiovascular:     Rate and Rhythm: Normal rate and regular rhythm.  Pulmonary:     Effort: Pulmonary effort is normal.     Breath sounds: Normal breath sounds. No wheezing, rhonchi or rales.  Neurological:     Mental Status: He is alert.     Lab Results  Component Value Date  WBC 6.5 12/30/2022   HGB 15.5 12/30/2022   HCT 45.5 12/30/2022   PLT 192 12/30/2022   GLUCOSE 98 12/30/2022   CHOL 191 12/30/2022   TRIG 186 (H) 12/30/2022   HDL 35 (L) 12/30/2022   LDLCALC 123 (H) 12/30/2022   ALT 50 (H) 12/30/2022   AST 33 12/30/2022   NA 145 (H) 12/30/2022   K 5.0 12/30/2022   CL 103 12/30/2022   CREATININE 0.98 12/30/2022   BUN 10 12/30/2022   CO2 27 12/30/2022   TSH 5.750 (H) 12/30/2022   INR 1.1 12/21/2020   HGBA1C 5.3 08/30/2020     Assessment & Plan:   Problem List Items Addressed This Visit       Cardiovascular and Mediastinum   Essential hypertension - Primary    No changes today.  Advised to check BP at home.  Continue losartan.      Relevant Medications   losartan (COZAAR) 50 MG tablet   Other Relevant Orders   CMP14+EGFR (Completed)     Digestive   History of hepatitis C   Relevant  Orders   HCV RNA quant (Completed)   Gastroesophageal reflux disease with esophagitis    Stable.  Continue Protonix.  Refilled today.        Endocrine   Hypothyroidism (Chronic)    TSH obtained and returned elevated.  Increasing levothyroxine.      Relevant Medications   levothyroxine (SYNTHROID) 112 MCG tablet   Other Relevant Orders   TSH (Completed)   Other Visit Diagnoses     Screening for deficiency anemia       Relevant Orders   CBC (Completed)   Screening, lipid       Relevant Orders   Lipid panel (Completed)   Prostate cancer screening       Relevant Orders   PSA (Completed)       Meds ordered this encounter  Medications   DISCONTD: levothyroxine (SYNTHROID) 100 MCG tablet    Sig: Take 1 tablet (100 mcg total) by mouth daily before breakfast.    Dispense:  90 tablet    Refill:  1   losartan (COZAAR) 50 MG tablet    Sig: Take 1 tablet (50 mg total) by mouth daily.    Dispense:  90 tablet    Refill:  1   pantoprazole (PROTONIX) 40 MG tablet    Sig: Take 1 tablet (40 mg total) by mouth 2 (two) times daily.    Dispense:  180 tablet    Refill:  1   levothyroxine (SYNTHROID) 112 MCG tablet    Sig: Take 1 tablet (112 mcg total) by mouth daily.    Dispense:  90 tablet    Refill:  3    New dose.    Follow-up:  Return in about 3 months (around 04/01/2023).  Everlene Other DO [facility]

## 2022-12-31 NOTE — Assessment & Plan Note (Signed)
No changes today.  Advised to check BP at home.  Continue losartan.

## 2022-12-31 NOTE — Assessment & Plan Note (Signed)
TSH obtained and returned elevated.  Increasing levothyroxine.

## 2023-01-01 LAB — CMP14+EGFR
ALT: 50 IU/L — ABNORMAL HIGH (ref 0–44)
Albumin/Globulin Ratio: 1.5 (ref 1.2–2.2)
Albumin: 4.5 g/dL (ref 3.9–4.9)
Alkaline Phosphatase: 79 IU/L (ref 44–121)
BUN/Creatinine Ratio: 10 (ref 10–24)
BUN: 10 mg/dL (ref 8–27)
CO2: 27 mmol/L (ref 20–29)
Calcium: 9.3 mg/dL (ref 8.6–10.2)
Chloride: 103 mmol/L (ref 96–106)
Glucose: 98 mg/dL (ref 70–99)
Potassium: 5 mmol/L (ref 3.5–5.2)
Total Protein: 7.5 g/dL (ref 6.0–8.5)

## 2023-01-01 LAB — CBC
Hematocrit: 45.5 % (ref 37.5–51.0)
Hemoglobin: 15.5 g/dL (ref 13.0–17.7)
MCH: 31.1 pg (ref 26.6–33.0)
MCHC: 34.1 g/dL (ref 31.5–35.7)
MCV: 91 fL (ref 79–97)
Platelets: 192 10*3/uL (ref 150–450)
RBC: 4.99 x10E6/uL (ref 4.14–5.80)
RDW: 13.2 % (ref 11.6–15.4)

## 2023-01-01 LAB — TSH: TSH: 5.75 u[IU]/mL — ABNORMAL HIGH (ref 0.450–4.500)

## 2023-01-01 LAB — LIPID PANEL
Cholesterol, Total: 191 mg/dL (ref 100–199)
HDL: 35 mg/dL — ABNORMAL LOW (ref >39)
LDL Chol Calc (NIH): 123 mg/dL — ABNORMAL HIGH (ref 0–99)
VLDL Cholesterol Cal: 33 mg/dL (ref 5–40)

## 2023-01-01 LAB — HCV RNA QUANT: Hepatitis C Quantitation: NOT DETECTED [IU]/mL

## 2023-01-04 ENCOUNTER — Other Ambulatory Visit: Payer: Self-pay | Admitting: Internal Medicine

## 2023-01-04 DIAGNOSIS — J189 Pneumonia, unspecified organism: Secondary | ICD-10-CM

## 2023-01-05 ENCOUNTER — Encounter: Payer: Self-pay | Admitting: *Deleted

## 2023-01-07 ENCOUNTER — Telehealth: Payer: Self-pay

## 2023-01-07 NOTE — Telephone Encounter (Signed)
Pt wife called back with a current number [phone] to return their call nurses have been trying to reach about lab work

## 2023-01-08 NOTE — Telephone Encounter (Signed)
See result note- lab work discussed with wife Amanda(DPR)

## 2023-01-09 ENCOUNTER — Other Ambulatory Visit: Payer: Self-pay | Admitting: Family Medicine

## 2023-01-09 DIAGNOSIS — J189 Pneumonia, unspecified organism: Secondary | ICD-10-CM

## 2023-01-09 MED ORDER — ROSUVASTATIN CALCIUM 10 MG PO TABS: 10.0000 mg | ORAL_TABLET | Freq: Every day | ORAL | 3 refills | Status: AC

## 2023-01-09 MED ORDER — ALBUTEROL SULFATE HFA 108 (90 BASE) MCG/ACT IN AERS: 2.0000 | INHALATION_SPRAY | Freq: Four times a day (QID) | RESPIRATORY_TRACT | 2 refills | Status: DC | PRN

## 2023-02-01 ENCOUNTER — Telehealth: Payer: Self-pay | Admitting: Family Medicine

## 2023-02-01 NOTE — Telephone Encounter (Signed)
Patient is requesting different sleep medication because he hasn't sleep in 3 days and started a new job. He wants to try trazodone ,he states was on this medication years ago and helped. [facility] 

## 2023-02-01 NOTE — Telephone Encounter (Signed)
Patient is requesting different sleep medication because he hasn't sleep in 3 days and started a new job. He wants to try trazodone ,he states was on this medication years ago and helped. [facility] [place]

## 2023-02-02 ENCOUNTER — Other Ambulatory Visit: Payer: Self-pay | Admitting: Family Medicine

## 2023-02-02 MED ORDER — TRAZODONE HCL 50 MG PO TABS: 50.0000 mg | ORAL_TABLET | Freq: Every evening | ORAL | 0 refills | Status: DC | PRN

## 2023-02-05 ENCOUNTER — Ambulatory Visit: Payer: 59

## 2023-04-07 ENCOUNTER — Telehealth: Payer: Self-pay

## 2023-04-07 NOTE — Transitions of Care (Post Inpatient/ED Visit) (Signed)
04/07/2023  Name: Christian Ellison MRN: [id number] DOB: [birth year]  Today's TOC FU Call Status: Today's TOC FU Call Status:: Successful TOC FU Call Completed TOC FU Call Complete Date: 04/07/23  Transition Care Management Follow-up Telephone Call Date of Discharge: 04/06/23 Discharge Facility: Other ([facility]) Name of Other ([facility]) Discharge Facility: [facility] Type of Discharge: Inpatient Admission Primary Inpatient Discharge Diagnosis:: withdrawal with delirium How have you been since you were released from the hospital?: Better Any questions or concerns?: No  Items Reviewed: Did you receive and understand the discharge instructions provided?: Yes Medications obtained,verified, and reconciled?: Yes (Medications Reviewed) Any new allergies since your discharge?: No Dietary orders reviewed?: Yes Do you have support at home?: Yes People in Home: spouse  Medications Reviewed Today: Medications Reviewed Today     Reviewed by Karena Addison, LPN (Licensed Practical Nurse) on 04/07/23 at 1109  Med List Status: <None>   Medication Order Taking? Sig Documenting Provider Last Dose Status Informant  acetaminophen (TYLENOL) 325 MG tablet [id number] No Take 2 tablets (650 mg total) by mouth every 6 (six) hours as needed for mild pain (or Fever >/= 101). Shon Hale, MD Taking Active Spouse/Significant Other           Med Note Reuben Likes Mar 23, 2022  9:25 AM)    albuterol (VENTOLIN HFA) 108 [phone] Base) MCG/ACT inhaler [id number]  Inhale 2 puffs into the lungs every 6 (six) hours as needed for wheezing or shortness of breath. Everlene Other G, DO  Active   ALPRAZolam Prudy Feeler) 1 MG tablet [id number] No Take 1 mg by mouth in the morning, at noon, in the evening, and at bedtime. [provider] Taking Active Spouse/Significant Other  cholecalciferol (VITAMIN D3) 25 MCG (1000 UNIT) tablet [id number] No Take 1,000 Units by mouth daily. [provider]  Taking Active Spouse/Significant Other  Cyanocobalamin (B-12 PO) [id number] No Take by mouth. [provider] Taking Active Spouse/Significant Other  fluticasone (FLONASE) 50 MCG/ACT nasal spray [id number] No Place 2 sprays into both nostrils daily. [provider] Taking Active Spouse/Significant Other  levothyroxine (SYNTHROID) 112 MCG tablet [id number]  Take 1 tablet (112 mcg total) by mouth daily. Everlene Other G, DO  Active   losartan (COZAAR) 50 MG tablet [id number]  Take 1 tablet (50 mg total) by mouth daily. Everlene Other G, DO  Active   pantoprazole (PROTONIX) 40 MG tablet [id number]  Take 1 tablet (40 mg total) by mouth 2 (two) times daily. Everlene Other G, DO  Active   rosuvastatin (CRESTOR) 10 MG tablet [id number]  Take 1 tablet (10 mg total) by mouth daily. Tommie Sams, DO  Active   traZODone (DESYREL) 50 MG tablet [id number]  Take 1-2 tablets (50-100 mg total) by mouth at bedtime as needed for sleep. Tommie Sams, DO  Active   Med List Note Charline Bills 03/23/22 6160): Pt's wife states that his tongue obstructs his air way while sleeping if he's not elevated, this occurs even when using the cpap.            Home Care and Equipment/Supplies: Were Home Health Services Ordered?: NA Any new equipment or medical supplies ordered?: NA  Functional Questionnaire: Do you need assistance with bathing/showering or dressing?: No Do you need assistance with meal preparation?: No Do you need assistance with eating?: No Do you have difficulty maintaining continence: No Do you need assistance with getting out of bed/getting out of a chair/moving?: No Do  you have difficulty managing or taking your medications?: No  Follow up appointments reviewed: PCP Follow-up appointment confirmed?: NA Specialist Hospital Follow-up appointment confirmed?: No Reason Specialist Follow-Up Not Confirmed: Patient has Specialist Provider Number and will Call for Appointment Do you need  transportation to your follow-up appointment?: No Do you understand care options if your condition(s) worsen?: Yes-patient verbalized understanding    SIGNATURE Karena Addison, LPN [facility] Nurse Health Advisor Direct Dial [phone]

## 2023-04-13 ENCOUNTER — Inpatient Hospital Stay: Payer: 59 | Admitting: Family Medicine

## 2023-04-16 ENCOUNTER — Encounter: Payer: Self-pay | Admitting: Neurology

## 2023-05-19 ENCOUNTER — Ambulatory Visit (INDEPENDENT_AMBULATORY_CARE_PROVIDER_SITE_OTHER): Payer: Medicare Other | Admitting: Family Medicine

## 2023-05-19 VITALS — BP 118/60 | HR 52 | Temp 97.3°F | Ht 72.0 in | Wt 223.0 lb

## 2023-05-19 DIAGNOSIS — Z23 Encounter for immunization: Secondary | ICD-10-CM | POA: Diagnosis not present

## 2023-05-19 DIAGNOSIS — G4700 Insomnia, unspecified: Secondary | ICD-10-CM | POA: Diagnosis not present

## 2023-05-19 DIAGNOSIS — F418 Other specified anxiety disorders: Secondary | ICD-10-CM | POA: Diagnosis not present

## 2023-05-19 DIAGNOSIS — F1320 Sedative, hypnotic or anxiolytic dependence, uncomplicated: Secondary | ICD-10-CM | POA: Diagnosis not present

## 2023-05-19 MED ORDER — TRAZODONE HCL 150 MG PO TABS: 150.0000 mg | ORAL_TABLET | Freq: Every evening | ORAL | 1 refills | Status: DC | PRN

## 2023-05-19 NOTE — Patient Instructions (Signed)
Trazodone as prescribed.  Referral to psychiatry placed.  Follow up in 3-6 months.

## 2023-05-20 NOTE — Assessment & Plan Note (Signed)
Multifactorial.  Trazodone as directed.

## 2023-05-20 NOTE — Assessment & Plan Note (Signed)
Recommended slow taper.  Advised patient to reach out to his psychiatrist at this time.  He is interested in seeing a local psychiatrist.  Will place referral.

## 2023-05-20 NOTE — Progress Notes (Signed)
Subjective:  Patient ID: Christian Ellison, male    DOB: [birth year]  Age: 62 y.o. MRN: [id number]  CC: Follow up  HPI:  62 year old male with hypertension, OSA, history of recurrent aspiration pneumonia, history of hepatitis C, history of substance abuse, hypothyroidism, benzodiazepine dependence presents for follow-up.  Patient recently hospitalized for seizure.  Seizure was thought to be secondary to abrupt cessation of alprazolam.  Patient reports that he is having considerable difficulty sleeping.  He was previously on trazodone and states that it helped some initially.  He is interested in discussing restarting this medication or another medication to help him sleep.  He is on chronic alprazolam which is prescribed by psychiatry.  He is interested in seeing another psychiatrist.  He is also interested in tapering this medication.  Will discuss today.  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   History of hepatitis C 01/17/2013   Anxiety with depression 01/17/2013    Social Hx   Social History   Socioeconomic History   Marital status: Married    Spouse name: Not on file   Number of children: Not on file   Years of education: Not on file   Highest education level: Not on file  Occupational History   Occupation: unemployed  Tobacco Use   Smoking status: Never   Smokeless tobacco: Never  Vaping Use   Vaping status: Never Used  Substance and Sexual Activity    Alcohol use: Not Currently   Drug use: Not Currently    Types: Benzodiazepines    Comment: opiates   Sexual activity: Not on file  Other Topics Concern   Not on file  Social History Narrative   Not on file   Social Determinants of Health   Financial Resource Strain: Low Risk  (04/05/2023)   Received from [facility]   Overall Financial Resource Strain (CARDIA)    Difficulty of Paying Living Expenses: Not hard at all  Food Insecurity: No Food Insecurity (04/05/2023)   [facility]   Hunger Vital Sign    Worried About Running Out of Food in the Last Year: Never true    Ran Out of Food in the Last Year: Never true  Transportation Needs: No Transportation Needs (04/05/2023)   Received from [facility]   PRAPARE - Transportation    Lack of Transportation (Medical): No    Lack of Transportation (Non-Medical): No  Physical Activity: Not on file  Stress: Not on file  Social Connections: Not on file    Review of Systems Per HPI  Objective:  BP 118/60   Pulse (!) 52   Temp (!) 97.3 F (36.3 C)   Ht 6' (1.829 m)   Wt 223 lb (101.2 kg)   SpO2 99%   BMI 30.24 kg/m      05/19/2023    2:40 PM 12/30/2022    3:09 PM 07/23/2022    3:25 PM  BP/Weight  Systolic BP 118 142 124  Diastolic BP 60 100 80  Wt. (Lbs) 223 233  BMI 30.24 kg/m2 31.6 kg/m2     Physical Exam  Lab Results  Component Value Date   WBC 6.5 12/30/2022   HGB 15.5 12/30/2022   HCT 45.5 12/30/2022   PLT 192 12/30/2022   GLUCOSE 98 12/30/2022   CHOL 191 12/30/2022   TRIG 186 (H) 12/30/2022   HDL 35 (L) 12/30/2022   LDLCALC 123 (H) 12/30/2022   ALT 50 (H) 12/30/2022   AST 33 12/30/2022   NA 145 (H) 12/30/2022   K 5.0 12/30/2022   CL 103 12/30/2022   CREATININE 0.98 12/30/2022   BUN 10 12/30/2022   CO2 27 12/30/2022   TSH 5.750 (H) 12/30/2022   INR 1.1 12/21/2020   HGBA1C 5.3 08/30/2020     Assessment & Plan:   Problem List Items Addressed This Visit       Other    Anxiety with depression   Relevant Medications   traZODone (DESYREL) 150 MG tablet   Other Relevant Orders   Ambulatory referral to Psychiatry   Insomnia - Primary    Multifactorial.  Trazodone as directed.      Benzodiazepine dependence (HCC)    Recommended slow taper.  Advised patient to reach out to his psychiatrist at this time.  He is interested in seeing a local psychiatrist.  Will place referral.      Relevant Orders   Ambulatory referral to Psychiatry   Other Visit Diagnoses     Immunization due       Relevant Orders   Flu vaccine trivalent PF, 6mos and older(Flulaval,Afluria,Fluarix,Fluzone) (Completed)       Meds ordered this encounter  Medications   traZODone (DESYREL) 150 MG tablet    Sig: Take 1 tablet (150 mg total) by mouth at bedtime as needed for sleep.    Dispense:  90 tablet    Refill:  1    Follow-up:  3-6 months  Miqueas Whilden Adriana Simas DO [facility]

## 2023-05-25 DIAGNOSIS — E7849 Other hyperlipidemia: Secondary | ICD-10-CM | POA: Diagnosis not present

## 2023-05-25 DIAGNOSIS — K2100 Gastro-esophageal reflux disease with esophagitis, without bleeding: Secondary | ICD-10-CM | POA: Diagnosis not present

## 2023-05-25 DIAGNOSIS — E039 Hypothyroidism, unspecified: Secondary | ICD-10-CM | POA: Diagnosis not present

## 2023-05-25 DIAGNOSIS — I10 Essential (primary) hypertension: Secondary | ICD-10-CM | POA: Diagnosis not present

## 2023-05-25 DIAGNOSIS — G4733 Obstructive sleep apnea (adult) (pediatric): Secondary | ICD-10-CM | POA: Diagnosis not present

## 2023-05-26 ENCOUNTER — Ambulatory Visit: Payer: Medicare Other | Admitting: Neurology

## 2023-05-28 DIAGNOSIS — K2100 Gastro-esophageal reflux disease with esophagitis, without bleeding: Secondary | ICD-10-CM | POA: Diagnosis not present

## 2023-05-28 DIAGNOSIS — I10 Essential (primary) hypertension: Secondary | ICD-10-CM | POA: Diagnosis not present

## 2023-05-28 DIAGNOSIS — E559 Vitamin D deficiency, unspecified: Secondary | ICD-10-CM | POA: Diagnosis not present

## 2023-05-28 DIAGNOSIS — G4733 Obstructive sleep apnea (adult) (pediatric): Secondary | ICD-10-CM | POA: Diagnosis not present

## 2023-05-28 DIAGNOSIS — E7849 Other hyperlipidemia: Secondary | ICD-10-CM | POA: Diagnosis not present

## 2023-05-28 DIAGNOSIS — E039 Hypothyroidism, unspecified: Secondary | ICD-10-CM | POA: Diagnosis not present

## 2023-05-29 DIAGNOSIS — K2100 Gastro-esophageal reflux disease with esophagitis, without bleeding: Secondary | ICD-10-CM | POA: Diagnosis not present

## 2023-05-29 DIAGNOSIS — E7849 Other hyperlipidemia: Secondary | ICD-10-CM | POA: Diagnosis not present

## 2023-05-29 DIAGNOSIS — I10 Essential (primary) hypertension: Secondary | ICD-10-CM | POA: Diagnosis not present

## 2023-05-29 DIAGNOSIS — E039 Hypothyroidism, unspecified: Secondary | ICD-10-CM | POA: Diagnosis not present

## 2023-05-29 DIAGNOSIS — G4733 Obstructive sleep apnea (adult) (pediatric): Secondary | ICD-10-CM | POA: Diagnosis not present

## 2023-06-14 DIAGNOSIS — I10 Essential (primary) hypertension: Secondary | ICD-10-CM | POA: Diagnosis not present

## 2023-06-22 DIAGNOSIS — Z1212 Encounter for screening for malignant neoplasm of rectum: Secondary | ICD-10-CM | POA: Diagnosis not present

## 2023-06-22 DIAGNOSIS — Z1211 Encounter for screening for malignant neoplasm of colon: Secondary | ICD-10-CM | POA: Diagnosis not present

## 2023-07-02 ENCOUNTER — Ambulatory Visit: Payer: 59 | Admitting: Family Medicine

## 2023-07-08 ENCOUNTER — Encounter: Payer: Self-pay | Admitting: Neurology

## 2023-07-08 ENCOUNTER — Ambulatory Visit: Payer: Medicare Other | Admitting: Neurology

## 2023-07-08 DIAGNOSIS — Z029 Encounter for administrative examinations, unspecified: Secondary | ICD-10-CM

## 2023-07-13 ENCOUNTER — Telehealth: Payer: Self-pay | Admitting: Family Medicine

## 2023-07-13 ENCOUNTER — Other Ambulatory Visit: Payer: Self-pay

## 2023-07-13 MED ORDER — PANTOPRAZOLE SODIUM 40 MG PO TBEC: 40.0000 mg | DELAYED_RELEASE_TABLET | Freq: Two times a day (BID) | ORAL | 1 refills | Status: DC

## 2023-07-13 NOTE — Telephone Encounter (Signed)
Refill on  pantoprazole (PROTONIX) 40 MG tablet  [place]  [facility] last seen 12/30/22

## 2023-07-13 NOTE — Telephone Encounter (Signed)
Rx (Protonix) Sent in to [facility]

## 2023-08-03 DIAGNOSIS — R195 Other fecal abnormalities: Secondary | ICD-10-CM | POA: Diagnosis not present

## 2023-08-16 DIAGNOSIS — E039 Hypothyroidism, unspecified: Secondary | ICD-10-CM | POA: Diagnosis not present

## 2023-08-16 DIAGNOSIS — Z8619 Personal history of other infectious and parasitic diseases: Secondary | ICD-10-CM | POA: Diagnosis not present

## 2023-08-16 DIAGNOSIS — R569 Unspecified convulsions: Secondary | ICD-10-CM | POA: Diagnosis not present

## 2023-08-16 DIAGNOSIS — K219 Gastro-esophageal reflux disease without esophagitis: Secondary | ICD-10-CM | POA: Diagnosis not present

## 2023-08-16 DIAGNOSIS — Z1211 Encounter for screening for malignant neoplasm of colon: Secondary | ICD-10-CM | POA: Diagnosis not present

## 2023-08-16 DIAGNOSIS — I10 Essential (primary) hypertension: Secondary | ICD-10-CM | POA: Diagnosis not present

## 2023-08-16 DIAGNOSIS — R195 Other fecal abnormalities: Secondary | ICD-10-CM | POA: Diagnosis not present

## 2023-08-16 DIAGNOSIS — Z885 Allergy status to narcotic agent status: Secondary | ICD-10-CM | POA: Diagnosis not present

## 2023-08-16 DIAGNOSIS — G4733 Obstructive sleep apnea (adult) (pediatric): Secondary | ICD-10-CM | POA: Diagnosis not present

## 2023-08-16 DIAGNOSIS — Z800 Family history of malignant neoplasm of digestive organs: Secondary | ICD-10-CM | POA: Diagnosis not present

## 2023-08-16 DIAGNOSIS — Z79899 Other long term (current) drug therapy: Secondary | ICD-10-CM | POA: Diagnosis not present

## 2023-08-16 DIAGNOSIS — K648 Other hemorrhoids: Secondary | ICD-10-CM | POA: Diagnosis not present

## 2023-08-25 ENCOUNTER — Ambulatory Visit: Payer: Medicare Other | Admitting: Family Medicine

## 2023-08-30 DIAGNOSIS — E7849 Other hyperlipidemia: Secondary | ICD-10-CM | POA: Diagnosis not present

## 2023-08-30 DIAGNOSIS — I10 Essential (primary) hypertension: Secondary | ICD-10-CM | POA: Diagnosis not present

## 2023-08-30 DIAGNOSIS — E039 Hypothyroidism, unspecified: Secondary | ICD-10-CM | POA: Diagnosis not present

## 2023-10-01 DIAGNOSIS — M25551 Pain in right hip: Secondary | ICD-10-CM | POA: Diagnosis not present

## 2023-10-26 DIAGNOSIS — R262 Difficulty in walking, not elsewhere classified: Secondary | ICD-10-CM | POA: Diagnosis not present

## 2023-10-26 DIAGNOSIS — M25551 Pain in right hip: Secondary | ICD-10-CM | POA: Diagnosis not present

## 2023-10-26 DIAGNOSIS — M6281 Muscle weakness (generalized): Secondary | ICD-10-CM | POA: Diagnosis not present

## 2023-10-28 DIAGNOSIS — R262 Difficulty in walking, not elsewhere classified: Secondary | ICD-10-CM | POA: Diagnosis not present

## 2023-10-28 DIAGNOSIS — M6281 Muscle weakness (generalized): Secondary | ICD-10-CM | POA: Diagnosis not present

## 2023-10-28 DIAGNOSIS — M25551 Pain in right hip: Secondary | ICD-10-CM | POA: Diagnosis not present

## 2023-11-01 DIAGNOSIS — M25551 Pain in right hip: Secondary | ICD-10-CM | POA: Diagnosis not present

## 2023-11-01 DIAGNOSIS — M6281 Muscle weakness (generalized): Secondary | ICD-10-CM | POA: Diagnosis not present

## 2023-11-01 DIAGNOSIS — R262 Difficulty in walking, not elsewhere classified: Secondary | ICD-10-CM | POA: Diagnosis not present

## 2023-11-02 ENCOUNTER — Other Ambulatory Visit: Payer: Self-pay

## 2023-11-02 MED ORDER — LOSARTAN POTASSIUM 50 MG PO TABS: 50.0000 mg | ORAL_TABLET | Freq: Every day | ORAL | 1 refills | Status: DC

## 2023-11-03 DIAGNOSIS — M25551 Pain in right hip: Secondary | ICD-10-CM | POA: Diagnosis not present

## 2023-11-03 DIAGNOSIS — M6281 Muscle weakness (generalized): Secondary | ICD-10-CM | POA: Diagnosis not present

## 2023-11-03 DIAGNOSIS — R262 Difficulty in walking, not elsewhere classified: Secondary | ICD-10-CM | POA: Diagnosis not present

## 2023-11-09 DIAGNOSIS — M6281 Muscle weakness (generalized): Secondary | ICD-10-CM | POA: Diagnosis not present

## 2023-11-09 DIAGNOSIS — M25551 Pain in right hip: Secondary | ICD-10-CM | POA: Diagnosis not present

## 2023-11-09 DIAGNOSIS — R262 Difficulty in walking, not elsewhere classified: Secondary | ICD-10-CM | POA: Diagnosis not present

## 2023-11-15 DIAGNOSIS — I10 Essential (primary) hypertension: Secondary | ICD-10-CM | POA: Diagnosis not present

## 2023-11-15 DIAGNOSIS — E782 Mixed hyperlipidemia: Secondary | ICD-10-CM | POA: Diagnosis not present

## 2023-11-15 DIAGNOSIS — E039 Hypothyroidism, unspecified: Secondary | ICD-10-CM | POA: Diagnosis not present

## 2023-11-15 DIAGNOSIS — K2100 Gastro-esophageal reflux disease with esophagitis, without bleeding: Secondary | ICD-10-CM | POA: Diagnosis not present

## 2023-11-16 DIAGNOSIS — M25551 Pain in right hip: Secondary | ICD-10-CM | POA: Diagnosis not present

## 2023-11-16 DIAGNOSIS — M6281 Muscle weakness (generalized): Secondary | ICD-10-CM | POA: Diagnosis not present

## 2023-11-16 DIAGNOSIS — R262 Difficulty in walking, not elsewhere classified: Secondary | ICD-10-CM | POA: Diagnosis not present

## 2023-11-18 DIAGNOSIS — M6281 Muscle weakness (generalized): Secondary | ICD-10-CM | POA: Diagnosis not present

## 2023-11-18 DIAGNOSIS — R262 Difficulty in walking, not elsewhere classified: Secondary | ICD-10-CM | POA: Diagnosis not present

## 2023-11-18 DIAGNOSIS — M25551 Pain in right hip: Secondary | ICD-10-CM | POA: Diagnosis not present

## 2023-11-23 DIAGNOSIS — M25551 Pain in right hip: Secondary | ICD-10-CM | POA: Diagnosis not present

## 2023-11-23 DIAGNOSIS — R262 Difficulty in walking, not elsewhere classified: Secondary | ICD-10-CM | POA: Diagnosis not present

## 2023-11-23 DIAGNOSIS — M6281 Muscle weakness (generalized): Secondary | ICD-10-CM | POA: Diagnosis not present

## 2023-11-30 DIAGNOSIS — M6281 Muscle weakness (generalized): Secondary | ICD-10-CM | POA: Diagnosis not present

## 2023-11-30 DIAGNOSIS — M25551 Pain in right hip: Secondary | ICD-10-CM | POA: Diagnosis not present

## 2023-11-30 DIAGNOSIS — R262 Difficulty in walking, not elsewhere classified: Secondary | ICD-10-CM | POA: Diagnosis not present

## 2023-12-02 DIAGNOSIS — M25551 Pain in right hip: Secondary | ICD-10-CM | POA: Diagnosis not present

## 2023-12-02 DIAGNOSIS — M6281 Muscle weakness (generalized): Secondary | ICD-10-CM | POA: Diagnosis not present

## 2023-12-02 DIAGNOSIS — R262 Difficulty in walking, not elsewhere classified: Secondary | ICD-10-CM | POA: Diagnosis not present

## 2023-12-15 DIAGNOSIS — E782 Mixed hyperlipidemia: Secondary | ICD-10-CM | POA: Diagnosis not present

## 2023-12-15 DIAGNOSIS — E039 Hypothyroidism, unspecified: Secondary | ICD-10-CM | POA: Diagnosis not present

## 2023-12-15 DIAGNOSIS — I10 Essential (primary) hypertension: Secondary | ICD-10-CM | POA: Diagnosis not present

## 2023-12-15 DIAGNOSIS — K2100 Gastro-esophageal reflux disease with esophagitis, without bleeding: Secondary | ICD-10-CM | POA: Diagnosis not present

## 2023-12-23 DIAGNOSIS — M6281 Muscle weakness (generalized): Secondary | ICD-10-CM | POA: Diagnosis not present

## 2023-12-23 DIAGNOSIS — R262 Difficulty in walking, not elsewhere classified: Secondary | ICD-10-CM | POA: Diagnosis not present

## 2023-12-23 DIAGNOSIS — M25551 Pain in right hip: Secondary | ICD-10-CM | POA: Diagnosis not present

## 2023-12-24 ENCOUNTER — Other Ambulatory Visit: Payer: Self-pay | Admitting: Family Medicine

## 2024-01-13 DIAGNOSIS — E782 Mixed hyperlipidemia: Secondary | ICD-10-CM | POA: Diagnosis not present

## 2024-01-13 DIAGNOSIS — E039 Hypothyroidism, unspecified: Secondary | ICD-10-CM | POA: Diagnosis not present

## 2024-01-13 DIAGNOSIS — I10 Essential (primary) hypertension: Secondary | ICD-10-CM | POA: Diagnosis not present

## 2024-01-14 DIAGNOSIS — E039 Hypothyroidism, unspecified: Secondary | ICD-10-CM | POA: Diagnosis not present

## 2024-01-14 DIAGNOSIS — E782 Mixed hyperlipidemia: Secondary | ICD-10-CM | POA: Diagnosis not present

## 2024-01-14 DIAGNOSIS — I10 Essential (primary) hypertension: Secondary | ICD-10-CM | POA: Diagnosis not present

## 2024-01-14 DIAGNOSIS — K2100 Gastro-esophageal reflux disease with esophagitis, without bleeding: Secondary | ICD-10-CM | POA: Diagnosis not present

## 2024-02-14 DIAGNOSIS — E039 Hypothyroidism, unspecified: Secondary | ICD-10-CM | POA: Diagnosis not present

## 2024-02-14 DIAGNOSIS — K2100 Gastro-esophageal reflux disease with esophagitis, without bleeding: Secondary | ICD-10-CM | POA: Diagnosis not present

## 2024-02-14 DIAGNOSIS — I10 Essential (primary) hypertension: Secondary | ICD-10-CM | POA: Diagnosis not present

## 2024-02-14 DIAGNOSIS — E782 Mixed hyperlipidemia: Secondary | ICD-10-CM | POA: Diagnosis not present

## 2024-03-16 DIAGNOSIS — I10 Essential (primary) hypertension: Secondary | ICD-10-CM | POA: Diagnosis not present

## 2024-03-16 DIAGNOSIS — K2100 Gastro-esophageal reflux disease with esophagitis, without bleeding: Secondary | ICD-10-CM | POA: Diagnosis not present

## 2024-03-16 DIAGNOSIS — E039 Hypothyroidism, unspecified: Secondary | ICD-10-CM | POA: Diagnosis not present

## 2024-03-16 DIAGNOSIS — E782 Mixed hyperlipidemia: Secondary | ICD-10-CM | POA: Diagnosis not present

## 2024-03-30 ENCOUNTER — Ambulatory Visit: Admitting: Neurology

## 2024-03-30 ENCOUNTER — Encounter: Payer: Self-pay | Admitting: Neurology

## 2024-04-14 DIAGNOSIS — E039 Hypothyroidism, unspecified: Secondary | ICD-10-CM | POA: Diagnosis not present

## 2024-04-14 DIAGNOSIS — E782 Mixed hyperlipidemia: Secondary | ICD-10-CM | POA: Diagnosis not present

## 2024-04-14 DIAGNOSIS — K2100 Gastro-esophageal reflux disease with esophagitis, without bleeding: Secondary | ICD-10-CM | POA: Diagnosis not present

## 2024-04-14 DIAGNOSIS — I10 Essential (primary) hypertension: Secondary | ICD-10-CM | POA: Diagnosis not present

## 2024-04-17 DIAGNOSIS — I10 Essential (primary) hypertension: Secondary | ICD-10-CM | POA: Diagnosis not present

## 2024-04-17 DIAGNOSIS — E039 Hypothyroidism, unspecified: Secondary | ICD-10-CM | POA: Diagnosis not present

## 2024-05-01 DIAGNOSIS — R350 Frequency of micturition: Secondary | ICD-10-CM | POA: Diagnosis not present

## 2024-05-01 DIAGNOSIS — E7849 Other hyperlipidemia: Secondary | ICD-10-CM | POA: Diagnosis not present

## 2024-05-01 DIAGNOSIS — K2100 Gastro-esophageal reflux disease with esophagitis, without bleeding: Secondary | ICD-10-CM | POA: Diagnosis not present

## 2024-05-01 DIAGNOSIS — E039 Hypothyroidism, unspecified: Secondary | ICD-10-CM | POA: Diagnosis not present

## 2024-05-01 DIAGNOSIS — R7301 Impaired fasting glucose: Secondary | ICD-10-CM | POA: Diagnosis not present

## 2024-05-01 DIAGNOSIS — G4733 Obstructive sleep apnea (adult) (pediatric): Secondary | ICD-10-CM | POA: Diagnosis not present

## 2024-05-01 DIAGNOSIS — I10 Essential (primary) hypertension: Secondary | ICD-10-CM | POA: Diagnosis not present

## 2024-05-16 DIAGNOSIS — I10 Essential (primary) hypertension: Secondary | ICD-10-CM | POA: Diagnosis not present

## 2024-05-16 DIAGNOSIS — K2100 Gastro-esophageal reflux disease with esophagitis, without bleeding: Secondary | ICD-10-CM | POA: Diagnosis not present

## 2024-05-16 DIAGNOSIS — E782 Mixed hyperlipidemia: Secondary | ICD-10-CM | POA: Diagnosis not present

## 2024-05-16 DIAGNOSIS — E039 Hypothyroidism, unspecified: Secondary | ICD-10-CM | POA: Diagnosis not present

## 2024-05-17 DIAGNOSIS — Z0001 Encounter for general adult medical examination with abnormal findings: Secondary | ICD-10-CM | POA: Diagnosis not present

## 2024-05-17 DIAGNOSIS — Z1389 Encounter for screening for other disorder: Secondary | ICD-10-CM | POA: Diagnosis not present

## 2024-05-17 DIAGNOSIS — E7849 Other hyperlipidemia: Secondary | ICD-10-CM | POA: Diagnosis not present

## 2024-05-17 DIAGNOSIS — Z23 Encounter for immunization: Secondary | ICD-10-CM | POA: Diagnosis not present

## 2024-05-17 DIAGNOSIS — E559 Vitamin D deficiency, unspecified: Secondary | ICD-10-CM | POA: Diagnosis not present

## 2024-05-17 DIAGNOSIS — I10 Essential (primary) hypertension: Secondary | ICD-10-CM | POA: Diagnosis not present

## 2024-05-17 DIAGNOSIS — E039 Hypothyroidism, unspecified: Secondary | ICD-10-CM | POA: Diagnosis not present

## 2024-05-20 ENCOUNTER — Inpatient Hospital Stay (HOSPITAL_COMMUNITY)
Admission: EM | Admit: 2024-05-20 | Discharge: 2024-05-22 | DRG: 177 | Disposition: A | Discharge status: Home / Self Care | Attending: Internal Medicine | Admitting: Internal Medicine

## 2024-05-20 ENCOUNTER — Emergency Department (HOSPITAL_COMMUNITY)

## 2024-05-20 ENCOUNTER — Encounter (HOSPITAL_COMMUNITY): Payer: Self-pay

## 2024-05-20 ENCOUNTER — Other Ambulatory Visit: Payer: Self-pay

## 2024-05-20 DIAGNOSIS — K222 Esophageal obstruction: Secondary | ICD-10-CM | POA: Diagnosis not present

## 2024-05-20 DIAGNOSIS — R519 Headache, unspecified: Secondary | ICD-10-CM | POA: Diagnosis not present

## 2024-05-20 DIAGNOSIS — Z8042 Family history of malignant neoplasm of prostate: Secondary | ICD-10-CM

## 2024-05-20 DIAGNOSIS — F1910 Other psychoactive substance abuse, uncomplicated: Secondary | ICD-10-CM | POA: Insufficient documentation

## 2024-05-20 DIAGNOSIS — J9622 Acute and chronic respiratory failure with hypercapnia: Secondary | ICD-10-CM | POA: Diagnosis present

## 2024-05-20 DIAGNOSIS — Z1152 Encounter for screening for COVID-19: Secondary | ICD-10-CM

## 2024-05-20 DIAGNOSIS — J9601 Acute respiratory failure with hypoxia: Principal | ICD-10-CM | POA: Insufficient documentation

## 2024-05-20 DIAGNOSIS — J690 Pneumonitis due to inhalation of food and vomit: Secondary | ICD-10-CM | POA: Diagnosis not present

## 2024-05-20 DIAGNOSIS — F1210 Cannabis abuse, uncomplicated: Secondary | ICD-10-CM | POA: Diagnosis present

## 2024-05-20 DIAGNOSIS — Z8419 Family history of other disorders of kidney and ureter: Secondary | ICD-10-CM

## 2024-05-20 DIAGNOSIS — E7220 Disorder of urea cycle metabolism, unspecified: Secondary | ICD-10-CM | POA: Diagnosis present

## 2024-05-20 DIAGNOSIS — R918 Other nonspecific abnormal finding of lung field: Secondary | ICD-10-CM | POA: Diagnosis not present

## 2024-05-20 DIAGNOSIS — Z885 Allergy status to narcotic agent status: Secondary | ICD-10-CM | POA: Diagnosis not present

## 2024-05-20 DIAGNOSIS — J9602 Acute respiratory failure with hypercapnia: Principal | ICD-10-CM | POA: Insufficient documentation

## 2024-05-20 DIAGNOSIS — D649 Anemia, unspecified: Secondary | ICD-10-CM | POA: Diagnosis present

## 2024-05-20 DIAGNOSIS — Z8249 Family history of ischemic heart disease and other diseases of the circulatory system: Secondary | ICD-10-CM | POA: Diagnosis not present

## 2024-05-20 DIAGNOSIS — Z91199 Patient's noncompliance with other medical treatment and regimen due to unspecified reason: Secondary | ICD-10-CM

## 2024-05-20 DIAGNOSIS — F1010 Alcohol abuse, uncomplicated: Secondary | ICD-10-CM | POA: Diagnosis present

## 2024-05-20 DIAGNOSIS — R0902 Hypoxemia: Secondary | ICD-10-CM | POA: Diagnosis not present

## 2024-05-20 DIAGNOSIS — G4733 Obstructive sleep apnea (adult) (pediatric): Secondary | ICD-10-CM | POA: Diagnosis not present

## 2024-05-20 DIAGNOSIS — J9621 Acute and chronic respiratory failure with hypoxia: Principal | ICD-10-CM | POA: Diagnosis present

## 2024-05-20 DIAGNOSIS — F1110 Opioid abuse, uncomplicated: Secondary | ICD-10-CM | POA: Diagnosis present

## 2024-05-20 DIAGNOSIS — Z79899 Other long term (current) drug therapy: Secondary | ICD-10-CM | POA: Diagnosis not present

## 2024-05-20 DIAGNOSIS — F419 Anxiety disorder, unspecified: Secondary | ICD-10-CM | POA: Diagnosis present

## 2024-05-20 DIAGNOSIS — Z28310 Unvaccinated for covid-19: Secondary | ICD-10-CM

## 2024-05-20 DIAGNOSIS — R1314 Dysphagia, pharyngoesophageal phase: Secondary | ICD-10-CM | POA: Diagnosis not present

## 2024-05-20 DIAGNOSIS — Z8619 Personal history of other infectious and parasitic diseases: Secondary | ICD-10-CM

## 2024-05-20 DIAGNOSIS — Z79890 Hormone replacement therapy (postmenopausal): Secondary | ICD-10-CM

## 2024-05-20 DIAGNOSIS — K2289 Other specified disease of esophagus: Secondary | ICD-10-CM | POA: Diagnosis present

## 2024-05-20 DIAGNOSIS — I10 Essential (primary) hypertension: Secondary | ICD-10-CM | POA: Diagnosis present

## 2024-05-20 DIAGNOSIS — E782 Mixed hyperlipidemia: Secondary | ICD-10-CM | POA: Diagnosis present

## 2024-05-20 DIAGNOSIS — E039 Hypothyroidism, unspecified: Secondary | ICD-10-CM | POA: Diagnosis present

## 2024-05-20 DIAGNOSIS — K224 Dyskinesia of esophagus: Secondary | ICD-10-CM | POA: Diagnosis present

## 2024-05-20 DIAGNOSIS — K3189 Other diseases of stomach and duodenum: Secondary | ICD-10-CM | POA: Diagnosis present

## 2024-05-20 DIAGNOSIS — Z803 Family history of malignant neoplasm of breast: Secondary | ICD-10-CM | POA: Diagnosis not present

## 2024-05-20 DIAGNOSIS — Z823 Family history of stroke: Secondary | ICD-10-CM

## 2024-05-20 DIAGNOSIS — K220 Achalasia of cardia: Secondary | ICD-10-CM | POA: Insufficient documentation

## 2024-05-20 DIAGNOSIS — Z8052 Family history of malignant neoplasm of bladder: Secondary | ICD-10-CM

## 2024-05-20 DIAGNOSIS — Z800 Family history of malignant neoplasm of digestive organs: Secondary | ICD-10-CM

## 2024-05-20 LAB — BLOOD GAS, ARTERIAL
Acid-Base Excess: 5.3 mmol/L — ABNORMAL HIGH (ref 0.0–2.0)
Allens test (pass/fail): POSITIVE — AB
Bicarbonate: 36 mmol/L — ABNORMAL HIGH (ref 20.0–28.0)
Drawn by: 27407
O2 Saturation: 37.3 %
Patient temperature: 37.6
pCO2 arterial: 88 mmHg (ref 32–48)
pH, Arterial: 7.22 — ABNORMAL LOW (ref 7.35–7.45)
pO2, Arterial: <31 mmHg — CL (ref 83–108)

## 2024-05-20 LAB — CBC WITH DIFFERENTIAL/PLATELET
Abs Immature Granulocytes: 0.02 K/uL (ref 0.00–0.07)
Basophils Absolute: 0 K/uL (ref 0.0–0.1)
Basophils Relative: 0 %
Eosinophils Absolute: 0.1 K/uL (ref 0.0–0.5)
Eosinophils Relative: 1 %
HCT: 37.5 % — ABNORMAL LOW (ref 39.0–52.0)
Hemoglobin: 12.6 g/dL — ABNORMAL LOW (ref 13.0–17.0)
Immature Granulocytes: 0 %
Lymphocytes Relative: 10 %
Lymphs Abs: 0.6 K/uL — ABNORMAL LOW (ref 0.7–4.0)
MCH: 31.3 pg (ref 26.0–34.0)
MCHC: 33.6 g/dL (ref 30.0–36.0)
MCV: 93.1 fL (ref 80.0–100.0)
Monocytes Absolute: 0.5 K/uL (ref 0.1–1.0)
Monocytes Relative: 8 %
Neutro Abs: 5.1 K/uL (ref 1.7–7.7)
Neutrophils Relative %: 81 %
Platelets: 131 K/uL — ABNORMAL LOW (ref 150–400)
RBC: 4.03 MIL/uL — ABNORMAL LOW (ref 4.22–5.81)
RDW: 12.2 % (ref 11.5–15.5)
WBC: 6.3 K/uL (ref 4.0–10.5)
nRBC: 0 % (ref 0.0–0.2)

## 2024-05-20 LAB — URINALYSIS, ROUTINE W REFLEX MICROSCOPIC
Bilirubin Urine: NEGATIVE
Glucose, UA: NEGATIVE mg/dL
Hgb urine dipstick: NEGATIVE
Ketones, ur: NEGATIVE mg/dL
Leukocytes,Ua: NEGATIVE
Nitrite: NEGATIVE
Protein, ur: NEGATIVE mg/dL
Specific Gravity, Urine: 1.015 (ref 1.005–1.030)
pH: 5 (ref 5.0–8.0)

## 2024-05-20 LAB — RESP PANEL BY RT-PCR (RSV, FLU A&B, COVID)  RVPGX2
Influenza A by PCR: NEGATIVE
Influenza B by PCR: NEGATIVE
Resp Syncytial Virus by PCR: NEGATIVE
SARS Coronavirus 2 by RT PCR: NEGATIVE

## 2024-05-20 LAB — ETHANOL: Alcohol, Ethyl (B): <15 mg/dL (ref <15)

## 2024-05-20 LAB — URINE DRUG SCREEN
Amphetamines: NEGATIVE
Barbiturates: NEGATIVE
Benzodiazepines: POSITIVE — AB
Cocaine: NEGATIVE
Fentanyl: NEGATIVE
Methadone Scn, Ur: NEGATIVE
Opiates: NEGATIVE
Tetrahydrocannabinol: POSITIVE — AB

## 2024-05-20 LAB — COMPREHENSIVE METABOLIC PANEL WITH GFR
ALT: 16 U/L (ref 0–44)
AST: 15 U/L (ref 15–41)
Albumin: 4.2 g/dL (ref 3.5–5.0)
Alkaline Phosphatase: 80 U/L (ref 38–126)
Anion gap: 9 (ref 5–15)
BUN: 11 mg/dL (ref 8–23)
CO2: 30 mmol/L (ref 22–32)
Calcium: 9 mg/dL (ref 8.9–10.3)
Chloride: 100 mmol/L (ref 98–111)
Creatinine, Ser: 0.84 mg/dL (ref 0.61–1.24)
GFR, Estimated: >60 mL/min (ref >60)
Glucose, Bld: 136 mg/dL — ABNORMAL HIGH (ref 70–99)
Potassium: 4.6 mmol/L (ref 3.5–5.1)
Sodium: 139 mmol/L (ref 135–145)
Total Bilirubin: 0.3 mg/dL (ref 0.0–1.2)
Total Protein: 7 g/dL (ref 6.5–8.1)

## 2024-05-20 LAB — COOXEMETRY PANEL
Carboxyhemoglobin: 1.1 % (ref 0.5–1.5)
Methemoglobin: 1.2 % (ref 0.0–1.5)
O2 Saturation: 38.7 %
Total hemoglobin: 15.2 g/dL (ref 12.0–16.0)

## 2024-05-20 LAB — AMMONIA: Ammonia: 41 umol/L — ABNORMAL HIGH (ref 9–35)

## 2024-05-20 LAB — LACTIC ACID, PLASMA: Lactic Acid, Venous: 1.5 mmol/L (ref 0.5–1.9)

## 2024-05-20 MED ORDER — LACTATED RINGERS IV BOLUS
1000.0000 mL | Freq: Once | INTRAVENOUS | Status: AC
  Administered 2024-05-20: 1000 mL via INTRAVENOUS

## 2024-05-20 NOTE — ED Provider Notes (Signed)
 [place] EMERGENCY DEPARTMENT AT [facility] Provider Note   CSN: [id number] Arrival date & time: 05/20/24  8084     Patient presents with: Headache   Christian Ellison is a 63 y.o. male with a history including hypothyroidism, history of hepatitis C, hypertension GERD with history of aspiration pneumonia, history of substance abuse, alcohol abuse presenting with a 4-day history of generalized headache along with increased drowsiness, stating he just does not feel well since he received his shingles shot and flu shot 4 days ago.  His wife called from home and left additional info with charge RN that he also had a fever of 102.3 earlier today, she also checked other vital signs including a heart rate in the 108-110 range and had a oxygen  level of 74%.  She is concern for return of his aspiration pneumonia.  He took tylenol  prior to arrival.  He denies chest pain, sob, cough, n/v, abdominal pain and has had no dysuria, no diarrhea.  He reports he and his wife have both been increasingly drowsy this week.  They do not heat with gas or propane heat.  {Add pertinent medical, surgical, social history, OB history to YEP:67052} The history is provided by the patient.       Prior to Admission medications   Medication Sig Start Date End Date Taking? Authorizing Provider  acetaminophen  (TYLENOL ) 325 MG tablet Take 2 tablets (650 mg total) by mouth every 6 (six) hours as needed for mild pain (or Fever >/= 101). 05/17/19   Pearlean Manus, MD  ALPRAZolam  (XANAX ) 1 MG tablet Take 1 mg by mouth in the morning, at noon, in the evening, and at bedtime. 10/22/19   [provider]  cholecalciferol (VITAMIN D3) 25 MCG (1000 UNIT) tablet Take 1,000 Units by mouth daily.    [provider]  Cyanocobalamin (B-12 PO) Take by mouth.    [provider]  fluticasone  (FLONASE ) 50 MCG/ACT nasal spray Place 2 sprays into both nostrils daily.    [provider]  levothyroxine   (SYNTHROID ) 112 MCG tablet Take 1 tablet (112 mcg total) by mouth daily. 12/31/22   Cook, Jayce G, DO  losartan  (COZAAR ) 50 MG tablet Take 1 tablet (50 mg total) by mouth daily. 11/02/23   Cook, Jayce G, DO  pantoprazole  (PROTONIX ) 40 MG tablet TAKE ONE TABLET BY MOUTH TWICE DAILY 12/24/23   Cook, Jayce G, DO  rosuvastatin  (CRESTOR ) 10 MG tablet Take 1 tablet (10 mg total) by mouth daily. 01/09/23   Cook, Jayce G, DO  traZODone  (DESYREL ) 150 MG tablet Take 1 tablet (150 mg total) by mouth at bedtime as needed for sleep. 05/19/23   Cook, Jayce G, DO    Allergies: Hydrocodone  and Other    Review of Systems  Constitutional:  Positive for fatigue and fever.  HENT:  Negative for congestion and sore throat.   Eyes: Negative.   Respiratory:  Negative for chest tightness and shortness of breath.   Cardiovascular:  Negative for chest pain.  Gastrointestinal:  Negative for abdominal pain, nausea and vomiting.  Genitourinary: Negative.   Musculoskeletal:  Negative for arthralgias, joint swelling and neck pain.  Skin: Negative.  Negative for rash and wound.  Neurological:  Positive for headaches. Negative for dizziness, weakness, light-headedness and numbness.  Psychiatric/Behavioral: Negative.      Updated Vital Signs BP 136/71 (BP Location: Right Arm)   Pulse 92   Temp 99.6 F (37.6 C) (Oral)   Resp 16   Wt 103.4  kg   SpO2 93%   BMI 30.92 kg/m   Physical Exam Vitals and nursing note reviewed.  Constitutional:      Appearance: He is well-developed. He is ill-appearing.     Comments: Falls asleep mid sentence  HENT:     Head: Normocephalic and atraumatic.     Mouth/Throat:     Mouth: Mucous membranes are dry.  Eyes:     Conjunctiva/sclera: Conjunctivae normal.  Cardiovascular:     Rate and Rhythm: Normal rate and regular rhythm.     Heart sounds: Normal heart sounds.  Pulmonary:     Effort: Pulmonary effort is normal.     Breath sounds: Normal breath sounds. No wheezing or rhonchi.   Abdominal:     General: Abdomen is protuberant. Bowel sounds are normal.     Palpations: Abdomen is soft.     Tenderness: There is no abdominal tenderness. There is no guarding.  Musculoskeletal:        General: No tenderness. Normal range of motion.     Cervical back: Normal range of motion.  Skin:    General: Skin is warm and dry.  Neurological:     General: No focal deficit present.     Mental Status: He is lethargic.     Cranial Nerves: No cranial nerve deficit.     Motor: No weakness.     (all labs ordered are listed, but only abnormal results are displayed) Labs Reviewed  RESP PANEL BY RT-PCR (RSV, FLU A&B, COVID)  [id number]  CULTURE, BLOOD (ROUTINE X 2)  CULTURE, BLOOD (ROUTINE X 2)  CBC WITH DIFFERENTIAL/PLATELET  COMPREHENSIVE METABOLIC PANEL WITH GFR  URINALYSIS, ROUTINE W REFLEX MICROSCOPIC  LACTIC ACID, PLASMA    EKG: EKG Interpretation Date/Time:  Saturday May 20 2024 20:46:47 EDT Ventricular Rate:  89 PR Interval:  140 QRS Duration:  91 QT Interval:  355 QTC Calculation: 432 R Axis:   193  Text Interpretation: Sinus rhythm Right axis deviation Nonspecific T abnormalities, lateral leads No significant change since last tracing Confirmed by Dean Clarity [phone]) on 05/20/2024 9:05:43 PM  Radiology: No results found.  {Document cardiac monitor, telemetry assessment procedure when appropriate:32947} Procedures   Medications Ordered in the ED  lactated ringers  bolus 1,000 mL (has no administration in time range)      {Click here for ABCD2, HEART and other calculators REFRESH Note before signing:1}                              Medical Decision Making Amount and/or Complexity of Data Reviewed Labs: ordered. Radiology: ordered.     {Document critical care time when appropriate  Document review of labs and clinical decision tools ie [id number], etc  Document your independent review of radiology images and any outside records  Document your  discussion with family members, caretakers and with consultants  Document social determinants of health affecting pt's care  Document your decision making why or why not admission, treatments were needed:32947:::1}   Final diagnoses:  None    ED Discharge Orders     None

## 2024-05-20 NOTE — ED Notes (Addendum)
 Pt's speech still groggy and slurred. Continues to be A&Ox4 and obeys commands.

## 2024-05-20 NOTE — ED Provider Notes (Incomplete)
 [place] EMERGENCY DEPARTMENT AT [facility] Provider Note   CSN: [id number] Arrival date & time: 05/20/24  8084     Patient presents with: Headache   Christian Ellison is a 63 y.o. male with a history including hypothyroidism, history of hepatitis C, hypertension GERD with history of aspiration pneumonia, history of substance abuse, alcohol abuse presenting with a 4-day history of generalized headache along with increased drowsiness, stating he just does not feel well since he received his shingles shot and flu shot 4 days ago.  His wife called from home and left additional info with charge RN that he also had a fever of 102.3 earlier today, she also checked other vital signs including a heart rate in the 108-110 range and had a oxygen  level of 74%.  She is concern for return of his aspiration pneumonia.  He took tylenol  prior to arrival.  He denies chest pain, sob, cough, n/v, abdominal pain and has had no dysuria, no diarrhea.  He reports he and his wife have both been increasingly drowsy this week.  They do not heat with gas or propane heat.  {Add pertinent medical, surgical, social history, OB history to YEP:67052} The history is provided by the patient.       Prior to Admission medications   Medication Sig Start Date End Date Taking? Authorizing Provider  acetaminophen  (TYLENOL ) 325 MG tablet Take 2 tablets (650 mg total) by mouth every 6 (six) hours as needed for mild pain (or Fever >/= 101). 05/17/19   Pearlean Manus, MD  ALPRAZolam  (XANAX ) 1 MG tablet Take 1 mg by mouth in the morning, at noon, in the evening, and at bedtime. 10/22/19   [provider]  cholecalciferol (VITAMIN D3) 25 MCG (1000 UNIT) tablet Take 1,000 Units by mouth daily.    [provider]  Cyanocobalamin (B-12 PO) Take by mouth.    [provider]  fluticasone  (FLONASE ) 50 MCG/ACT nasal spray Place 2 sprays into both nostrils daily.    [provider]  levothyroxine   (SYNTHROID ) 112 MCG tablet Take 1 tablet (112 mcg total) by mouth daily. 12/31/22   Cook, Jayce G, DO  losartan  (COZAAR ) 50 MG tablet Take 1 tablet (50 mg total) by mouth daily. 11/02/23   Cook, Jayce G, DO  pantoprazole  (PROTONIX ) 40 MG tablet TAKE ONE TABLET BY MOUTH TWICE DAILY 12/24/23   Cook, Jayce G, DO  rosuvastatin  (CRESTOR ) 10 MG tablet Take 1 tablet (10 mg total) by mouth daily. 01/09/23   Cook, Jayce G, DO  traZODone  (DESYREL ) 150 MG tablet Take 1 tablet (150 mg total) by mouth at bedtime as needed for sleep. 05/19/23   Cook, Jayce G, DO    Allergies: Hydrocodone  and Other    Review of Systems  Constitutional:  Positive for fatigue and fever.  HENT:  Negative for congestion and sore throat.   Eyes: Negative.   Respiratory:  Negative for chest tightness and shortness of breath.   Cardiovascular:  Negative for chest pain.  Gastrointestinal:  Negative for abdominal pain, nausea and vomiting.  Genitourinary: Negative.   Musculoskeletal:  Negative for arthralgias, joint swelling and neck pain.  Skin: Negative.  Negative for rash and wound.  Neurological:  Positive for headaches. Negative for dizziness, weakness, light-headedness and numbness.  Psychiatric/Behavioral: Negative.      Updated Vital Signs BP 129/88   Pulse 89   Temp 99.6 F (37.6 C) (Oral)   Resp 13   Wt 103.4 kg   SpO2  95%   BMI 30.92 kg/m   Physical Exam Vitals and nursing note reviewed.  Constitutional:      Appearance: He is well-developed. He is ill-appearing.     Comments: Falls asleep mid sentence  HENT:     Head: Normocephalic and atraumatic.     Mouth/Throat:     Mouth: Mucous membranes are dry.  Eyes:     Conjunctiva/sclera: Conjunctivae normal.  Cardiovascular:     Rate and Rhythm: Normal rate and regular rhythm.     Heart sounds: Normal heart sounds.  Pulmonary:     Effort: Pulmonary effort is normal.     Breath sounds: Normal breath sounds. No wheezing or rhonchi.  Abdominal:     General:  Abdomen is protuberant. Bowel sounds are normal.     Palpations: Abdomen is soft.     Tenderness: There is no abdominal tenderness. There is no guarding.  Musculoskeletal:        General: No tenderness. Normal range of motion.     Cervical back: Normal range of motion.  Skin:    General: Skin is warm and dry.  Neurological:     General: No focal deficit present.     Mental Status: He is lethargic.     Cranial Nerves: No cranial nerve deficit.     Motor: No weakness.     (all labs ordered are listed, but only abnormal results are displayed) Labs Reviewed  CBC WITH DIFFERENTIAL/PLATELET - Abnormal; Notable for the following components:      Result Value   RBC 4.03 (*)    Hemoglobin 12.6 (*)    HCT 37.5 (*)    Platelets 131 (*)    Lymphs Abs 0.6 (*)    All other components within normal limits  AMMONIA - Abnormal; Notable for the following components:   Ammonia 41 (*)    All other components within normal limits  URINE DRUG SCREEN - Abnormal; Notable for the following components:   Benzodiazepines POSITIVE (*)    Tetrahydrocannabinol POSITIVE (*)    All other components within normal limits  COMPREHENSIVE METABOLIC PANEL WITH GFR - Abnormal; Notable for the following components:   Glucose, Bld 136 (*)    All other components within normal limits  BLOOD GAS, ARTERIAL - Abnormal; Notable for the following components:   pH, Arterial 7.22 (*)    pCO2 arterial 88 (*)    pO2, Arterial <31 (*)    Bicarbonate 36.0 (*)    Acid-Base Excess 5.3 (*)    Allens test (pass/fail) POSITIVE (*)    All other components within normal limits  RESP PANEL BY RT-PCR (RSV, FLU A&B, COVID)  RVPGX2  CULTURE, BLOOD (ROUTINE X 2)  CULTURE, BLOOD (ROUTINE X 2)  URINALYSIS, ROUTINE W REFLEX MICROSCOPIC  LACTIC ACID, PLASMA  ETHANOL  COOXEMETRY PANEL    EKG: EKG Interpretation Date/Time:  Saturday May 20 2024 20:46:47 EDT Ventricular Rate:  89 PR Interval:  140 QRS Duration:  91 QT  Interval:  355 QTC Calculation: 432 R Axis:   193  Text Interpretation: Sinus rhythm Right axis deviation Nonspecific T abnormalities, lateral leads No significant change since last tracing Confirmed by Dean Clarity [phone]) on 05/20/2024 9:05:43 PM  Radiology: [facility] Chest Portable 1 View Result Date: 05/20/2024 CLINICAL DATA:  Hypoxia. EXAM: PORTABLE CHEST 1 VIEW COMPARISON:  Chest CT with contrast 12/23/2020, chest AP portable 12/24/2020. FINDINGS: There are 2 nearby nodules in the right mid lung measuring 9 mm and 7 mm which were  seen previously, presumably benign given length of stability. The lungs are clear of infiltrates. No pleural effusion is seen. The cardiomediastinal silhouette and vascular markings are normal. Thoracic spondylosis and bilateral shoulder DJD. IMPRESSION: 1. No evidence of acute chest disease. 2. Two nearby nodules in the right mid lung measuring 9 mm and 7 mm, presumably benign given length of stability. Electronically Signed   By: Francis Quam M.D.   On: 05/20/2024 21:50    {Document cardiac monitor, telemetry assessment procedure when appropriate:32947} Procedures   Medications Ordered in the ED  lactated ringers  bolus 1,000 mL (1,000 mLs Intravenous New Bag/Given 05/20/24 2121)      {Click here for ABCD2, HEART and other calculators REFRESH Note before signing:1}                              Medical Decision Making Pt presenting with febrile illness of unclear etiology, but per wife's report has had fever to 102.3 and hypoxia today.  Pt stating he has not felt well since getting flu and covid vaccines 4 days ago.  Pt is somnolent here, h/o aspiration pneumonia with sepsis, but no hypoxia upon arrival,  temp 99.6 but had tylenol  prior to arrival.  DDX including sepsis, pneumonia,  hepatic encephalopathy, intoxication/drug use,  CO exposure, drug/vaccine reaction.  Labs and imaging obtained.   ABG shows elevated CO2 with hypoxia.  BIpap initiated. No pneumonia per  cxr.    Amount and/or Complexity of Data Reviewed Labs: ordered.    Details: Pertinent labs including CO2 88,  O2 <31, ammonia 41, respiratory panel negative, carboxyhemoglobin normal 1.1,  wbc normal,  lactic acid normal 1.5,  UDS positive THC, benzo's.  Radiology: ordered.    Details: Reviewed, negative for pneumonia. ECG/medicine tests: ordered and independent interpretation performed.    Details: NSR - rate 89.  Right axis deviation Discussion of management or test interpretation with external provider(s): Call placed to hospitalist for admission.   Risk Decision regarding hospitalization.   CRITICAL CARE Performed by: Mliss Narrow Total critical care time: 45 minutes Critical care time was exclusive of separately billable procedures and treating other patients. Critical care was necessary to treat or prevent imminent or life-threatening deterioration. Critical care was time spent personally by me on the following activities: development of treatment plan with patient and/or surrogate as well as nursing, discussions with consultants, evaluation of patient's response to treatment, examination of patient, obtaining history from patient or surrogate, ordering and performing treatments and interventions, ordering and review of laboratory studies, ordering and review of radiographic studies, pulse oximetry and re-evaluation of patient's condition.   {Document critical care time when appropriate  Document review of labs and clinical decision tools ie [id number], etc  Document your independent review of radiology images and any outside records  Document your discussion with family members, caretakers and with consultants  Document social determinants of health affecting pt's care  Document your decision making why or why not admission, treatments were needed:32947:::1}   Final diagnoses:  Acute respiratory failure with hypoxia and hypercapnia [facility])    ED Discharge Orders     None

## 2024-05-20 NOTE — ED Notes (Addendum)
 Pt lying in bed speaking with family on the phone. Speech sounds groggy and slurred to this nurse at this time. Pt is a&ox4 and ambulatory at this time. VS WNL 3L via [place]. Stated he has a headache denies pain elsewhere.

## 2024-05-20 NOTE — ED Triage Notes (Signed)
 Pt with headache today.

## 2024-05-21 DIAGNOSIS — E782 Mixed hyperlipidemia: Secondary | ICD-10-CM | POA: Diagnosis not present

## 2024-05-21 DIAGNOSIS — J9602 Acute respiratory failure with hypercapnia: Secondary | ICD-10-CM

## 2024-05-21 DIAGNOSIS — K222 Esophageal obstruction: Secondary | ICD-10-CM | POA: Diagnosis not present

## 2024-05-21 DIAGNOSIS — E7220 Disorder of urea cycle metabolism, unspecified: Secondary | ICD-10-CM | POA: Insufficient documentation

## 2024-05-21 DIAGNOSIS — J690 Pneumonitis due to inhalation of food and vomit: Secondary | ICD-10-CM | POA: Insufficient documentation

## 2024-05-21 DIAGNOSIS — J9621 Acute and chronic respiratory failure with hypoxia: Secondary | ICD-10-CM | POA: Diagnosis present

## 2024-05-21 DIAGNOSIS — G4733 Obstructive sleep apnea (adult) (pediatric): Secondary | ICD-10-CM | POA: Diagnosis not present

## 2024-05-21 DIAGNOSIS — I10 Essential (primary) hypertension: Secondary | ICD-10-CM

## 2024-05-21 DIAGNOSIS — Z803 Family history of malignant neoplasm of breast: Secondary | ICD-10-CM | POA: Diagnosis not present

## 2024-05-21 DIAGNOSIS — E039 Hypothyroidism, unspecified: Secondary | ICD-10-CM

## 2024-05-21 DIAGNOSIS — J9601 Acute respiratory failure with hypoxia: Secondary | ICD-10-CM | POA: Diagnosis present

## 2024-05-21 DIAGNOSIS — K220 Achalasia of cardia: Secondary | ICD-10-CM | POA: Insufficient documentation

## 2024-05-21 DIAGNOSIS — D649 Anemia, unspecified: Secondary | ICD-10-CM | POA: Diagnosis present

## 2024-05-21 DIAGNOSIS — F419 Anxiety disorder, unspecified: Secondary | ICD-10-CM | POA: Diagnosis present

## 2024-05-21 DIAGNOSIS — Z1152 Encounter for screening for COVID-19: Secondary | ICD-10-CM | POA: Diagnosis not present

## 2024-05-21 DIAGNOSIS — Z28310 Unvaccinated for covid-19: Secondary | ICD-10-CM | POA: Diagnosis not present

## 2024-05-21 DIAGNOSIS — F1210 Cannabis abuse, uncomplicated: Secondary | ICD-10-CM | POA: Diagnosis present

## 2024-05-21 DIAGNOSIS — F1110 Opioid abuse, uncomplicated: Secondary | ICD-10-CM | POA: Diagnosis present

## 2024-05-21 DIAGNOSIS — F1910 Other psychoactive substance abuse, uncomplicated: Secondary | ICD-10-CM | POA: Insufficient documentation

## 2024-05-21 DIAGNOSIS — Z8249 Family history of ischemic heart disease and other diseases of the circulatory system: Secondary | ICD-10-CM | POA: Diagnosis not present

## 2024-05-21 DIAGNOSIS — Z79890 Hormone replacement therapy (postmenopausal): Secondary | ICD-10-CM | POA: Diagnosis not present

## 2024-05-21 DIAGNOSIS — Z79899 Other long term (current) drug therapy: Secondary | ICD-10-CM | POA: Diagnosis not present

## 2024-05-21 DIAGNOSIS — R519 Headache, unspecified: Secondary | ICD-10-CM | POA: Diagnosis present

## 2024-05-21 DIAGNOSIS — Z885 Allergy status to narcotic agent status: Secondary | ICD-10-CM | POA: Diagnosis not present

## 2024-05-21 DIAGNOSIS — F1010 Alcohol abuse, uncomplicated: Secondary | ICD-10-CM | POA: Diagnosis present

## 2024-05-21 DIAGNOSIS — J9622 Acute and chronic respiratory failure with hypercapnia: Secondary | ICD-10-CM

## 2024-05-21 DIAGNOSIS — R1314 Dysphagia, pharyngoesophageal phase: Secondary | ICD-10-CM | POA: Diagnosis present

## 2024-05-21 DIAGNOSIS — K224 Dyskinesia of esophagus: Secondary | ICD-10-CM | POA: Diagnosis present

## 2024-05-21 LAB — MAGNESIUM: Magnesium: 2.2 mg/dL (ref 1.7–2.4)

## 2024-05-21 LAB — CBC
HCT: 35.5 % — ABNORMAL LOW (ref 39.0–52.0)
Hemoglobin: 12.1 g/dL — ABNORMAL LOW (ref 13.0–17.0)
MCH: 31.6 pg (ref 26.0–34.0)
MCHC: 34.1 g/dL (ref 30.0–36.0)
MCV: 92.7 fL (ref 80.0–100.0)
Platelets: 145 K/uL — ABNORMAL LOW (ref 150–400)
RBC: 3.83 MIL/uL — ABNORMAL LOW (ref 4.22–5.81)
RDW: 12.3 % (ref 11.5–15.5)
WBC: 5.4 K/uL (ref 4.0–10.5)
nRBC: 0 % (ref 0.0–0.2)

## 2024-05-21 LAB — COMPREHENSIVE METABOLIC PANEL WITH GFR
ALT: 14 U/L (ref 0–44)
AST: 17 U/L (ref 15–41)
Albumin: 4.1 g/dL (ref 3.5–5.0)
Alkaline Phosphatase: 77 U/L (ref 38–126)
Anion gap: 11 (ref 5–15)
BUN: 8 mg/dL (ref 8–23)
CO2: 28 mmol/L (ref 22–32)
Calcium: 9 mg/dL (ref 8.9–10.3)
Chloride: 100 mmol/L (ref 98–111)
Creatinine, Ser: 0.76 mg/dL (ref 0.61–1.24)
GFR, Estimated: >60 mL/min (ref >60)
Glucose, Bld: 134 mg/dL — ABNORMAL HIGH (ref 70–99)
Potassium: 4.6 mmol/L (ref 3.5–5.1)
Sodium: 139 mmol/L (ref 135–145)
Total Bilirubin: 0.4 mg/dL (ref 0.0–1.2)
Total Protein: 6.9 g/dL (ref 6.5–8.1)

## 2024-05-21 LAB — BLOOD GAS, ARTERIAL
Acid-Base Excess: 7.2 mmol/L — ABNORMAL HIGH (ref 0.0–2.0)
Bicarbonate: 36.4 mmol/L — ABNORMAL HIGH (ref 20.0–28.0)
Drawn by: 27407
O2 Saturation: 93.1 %
Patient temperature: 37.6
pCO2 arterial: 76 mmHg (ref 32–48)
pH, Arterial: 7.29 — ABNORMAL LOW (ref 7.35–7.45)
pO2, Arterial: 64 mmHg — ABNORMAL LOW (ref 83–108)

## 2024-05-21 LAB — MRSA NEXT GEN BY PCR, NASAL: MRSA by PCR Next Gen: NOT DETECTED

## 2024-05-21 LAB — PHOSPHORUS: Phosphorus: 3.2 mg/dL (ref 2.5–4.6)

## 2024-05-21 LAB — STREP PNEUMONIAE URINARY ANTIGEN: Strep Pneumo Urinary Antigen: NEGATIVE

## 2024-05-21 LAB — HIV ANTIBODY (ROUTINE TESTING W REFLEX): HIV Screen 4th Generation wRfx: NONREACTIVE

## 2024-05-21 LAB — PROCALCITONIN: Procalcitonin: <0.1 ng/mL

## 2024-05-21 MED ORDER — PANTOPRAZOLE SODIUM 40 MG PO TBEC: 40.0000 mg | DELAYED_RELEASE_TABLET | Freq: Two times a day (BID) | ORAL | Status: DC

## 2024-05-21 MED ORDER — DM-GUAIFENESIN ER 30-600 MG PO TB12
1.0000 | ORAL_TABLET | Freq: Two times a day (BID) | ORAL | Status: DC
  Administered 2024-05-21 – 2024-05-22 (×2): 1 via ORAL
  Filled 2024-05-21 (×3): qty 1

## 2024-05-21 MED ORDER — ROSUVASTATIN CALCIUM 10 MG PO TABS
10.0000 mg | ORAL_TABLET | Freq: Every day | ORAL | Status: DC
  Administered 2024-05-22: 10 mg via ORAL
  Filled 2024-05-21 (×2): qty 1

## 2024-05-21 MED ORDER — LEVOTHYROXINE SODIUM 112 MCG PO TABS
112.0000 ug | ORAL_TABLET | Freq: Every day | ORAL | Status: DC
  Administered 2024-05-22: 112 ug via ORAL
  Filled 2024-05-21: qty 1

## 2024-05-21 MED ORDER — CHLORHEXIDINE GLUCONATE CLOTH 2 % EX PADS
6.0000 | MEDICATED_PAD | Freq: Every day | CUTANEOUS | Status: DC
  Administered 2024-05-21 – 2024-05-22 (×3): 6 via TOPICAL

## 2024-05-21 MED ORDER — ORAL CARE MOUTH RINSE: 15.0000 mL | OROMUCOSAL | Status: DC | PRN

## 2024-05-21 MED ORDER — TRAZODONE HCL 50 MG PO TABS
100.0000 mg | ORAL_TABLET | Freq: Every day | ORAL | Status: DC
  Administered 2024-05-21: 100 mg via ORAL
  Filled 2024-05-21: qty 2

## 2024-05-21 MED ORDER — LOSARTAN POTASSIUM 50 MG PO TABS: 50.0000 mg | ORAL_TABLET | Freq: Every day | ORAL | Status: DC

## 2024-05-21 MED ORDER — LACTULOSE 10 GM/15ML PO SOLN: 20.0000 g | Freq: Three times a day (TID) | ORAL | Status: DC

## 2024-05-21 MED ORDER — ALPRAZOLAM 0.5 MG PO TABS
1.0000 mg | ORAL_TABLET | Freq: Three times a day (TID) | ORAL | Status: DC
Start: 2024-05-21 — End: 2024-05-21

## 2024-05-21 MED ORDER — PANTOPRAZOLE SODIUM 40 MG IV SOLR
40.0000 mg | Freq: Two times a day (BID) | INTRAVENOUS | Status: DC
  Administered 2024-05-21 – 2024-05-22 (×3): 40 mg via INTRAVENOUS
  Filled 2024-05-21 (×3): qty 10

## 2024-05-21 MED ORDER — ENOXAPARIN SODIUM 40 MG/0.4ML IJ SOSY
40.0000 mg | PREFILLED_SYRINGE | INTRAMUSCULAR | Status: DC
Start: 2024-05-21 — End: 2024-05-22
  Administered 2024-05-21 – 2024-05-22 (×2): 40 mg via SUBCUTANEOUS
  Filled 2024-05-21 (×2): qty 0.4

## 2024-05-21 MED ORDER — ONDANSETRON HCL 4 MG/2ML IJ SOLN: 4.0000 mg | Freq: Four times a day (QID) | INTRAMUSCULAR | Status: DC | PRN

## 2024-05-21 MED ORDER — SODIUM CHLORIDE 0.9 % IV SOLN
500.0000 mg | Freq: Every day | INTRAVENOUS | Status: DC
  Administered 2024-05-21 – 2024-05-22 (×2): 500 mg via INTRAVENOUS
  Filled 2024-05-21 (×2): qty 5

## 2024-05-21 MED ORDER — ONDANSETRON HCL 4 MG PO TABS: 4.0000 mg | ORAL_TABLET | Freq: Four times a day (QID) | ORAL | Status: DC | PRN

## 2024-05-21 MED ORDER — AMPICILLIN-SULBACTAM SODIUM 3 (2-1) G IJ SOLR
3.0000 g | Freq: Four times a day (QID) | INTRAMUSCULAR | Status: DC
  Administered 2024-05-21 – 2024-05-22 (×5): 3 g via INTRAVENOUS
  Filled 2024-05-21 (×11): qty 8

## 2024-05-21 MED ORDER — ACETAMINOPHEN 650 MG RE SUPP: 650.0000 mg | Freq: Four times a day (QID) | RECTAL | Status: DC | PRN

## 2024-05-21 MED ORDER — ACETAMINOPHEN 325 MG PO TABS
650.0000 mg | ORAL_TABLET | Freq: Four times a day (QID) | ORAL | Status: DC | PRN
  Administered 2024-05-21: 650 mg via ORAL
  Filled 2024-05-21: qty 2

## 2024-05-21 NOTE — H&P (Addendum)
 History and Physical    Patient: Christian Ellison FMW:[id number] DOB: August 16, 1961 DOA: 05/20/2024 DOS: the patient was seen and examined on 05/21/2024 PCP: [facility]  Patient coming from: Home  Chief Complaint:  Chief Complaint  Patient presents with   Headache   HPI: Christian Ellison is a 63 y.o. male with medical history significant of esophageal dysmotility/esophageal stricture, recurrent aspiration pneumonia, hypothyroidism, anxiety,  prior polysubstance abuse, OSA on CPAP and recovered hepatitis C who presents to the emergency department due to 4-day onset of increased drowsiness.  At bedside, patient was on BiPAP and states that wife asked him to come to the ED.  He complained of not feeling well since he received his shingles and flu shots about 4 days ago. Wife was reported to have called the ED secretary that patient choked on some saltine crackers about 2 nights ago and he had fever of 102.13F earlier yesterday with O2 sat of 74% and heart rate in the range of 108 - 110, she was concerned for return of aspiration pneumonia.  Patient also took Tylenol  prior to arrival to the ED.  Patient endorsed mild headache yesterday, but denies chest pain, nausea, and vomiting. Patient's CPAP broke about 6 weeks ago and he just received a replacement yesterday.  ED Course:  In the emergency department, he was hemodynamically stable except for O2 supplement 5% on room air which improved to 95-97% on BiPAP with FiO2 of 40%.  ABG, pH 7.22, pCO2 88, pO2 < 31 bicarb 36.0.  CBC showed normocytic anemia with platelets of 131.  BMP was normal except for blood glucose of 136.  Urine drug screen was positive for benzodiazepine and THC.  Lactic acid was normal, ammonia 41, alcohol level was undetectable.  Influenza A, B, SARS, RSV, RSV was negative. Chest x-ray showed no evidence of acute chest disease. Two nearby nodules in the right mid lung measuring 9 mm and 7 mm, presumably benign given  length of stability. IV LR 1 L was provided.  TRH was asked to admit patient.  Review of Systems: Review of systems as noted in the HPI. All other systems reviewed and are negative.   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    Hypertension    Opiate abuse, continuous (HCC)    Pulmonary edema 02/02/2018   Sepsis due to pneumonia (HCC) 03/11/2019   Sleep apnea    CPAP nightly   Past Surgical History:  Procedure Laterality Date   BIOPSY  03/18/2018   Procedure: BIOPSY;  Surgeon: Golda Claudis PENNER, MD;  Location: [facility];  Service: Endoscopy;;  gastric    COLONOSCOPY N/A 11/22/2013   Dr. Golda: Normal except for hemorrhoids.  Next colonoscopy 10 years.   ESOPHAGEAL DILATION N/A 03/18/2018   Procedure: ESOPHAGEAL DILATION;  Surgeon: Golda Claudis PENNER, MD;  Location: [facility];  Service: Endoscopy;  Laterality: N/A;   ESOPHAGEAL DILATION N/A 08/07/2020   Procedure: ESOPHAGEAL DILATION;  Surgeon: Golda Claudis PENNER, MD;  Location: [facility];  Service: Endoscopy;  Laterality: N/A;   ESOPHAGEAL MANOMETRY N/A 06/15/2018   Procedure: ESOPHAGEAL MANOMETRY (EM);  Surgeon: Shila Gustav GAILS, MD;  Location: [facility];  Service:  Endoscopy;  Laterality: N/A;   ESOPHAGOGASTRODUODENOSCOPY (EGD) WITH PROPOFOL  N/A 03/18/2018   Dr. Golda: Benign-appearing mild esophageal stenosis proximal to the GE J, status post dilation.  2 cm hiatal hernia.  Gastritis, biopsies negative for H. pylori   ESOPHAGOGASTRODUODENOSCOPY (EGD) WITH PROPOFOL  N/A 08/07/2020   Procedure: ESOPHAGOGASTRODUODENOSCOPY (EGD) WITH PROPOFOL ;  Surgeon: Golda Claudis PENNER, MD;  Location: [facility];  Service: Endoscopy;  Laterality: N/A;   Fracture Right Leg     Patient has rod/screws in  this leg   ORIF CALCANEOUS FRACTURE Left 07/04/2020   Procedure: OPEN REDUCTION INTERNAL FIXATION (ORIF) LEFT TALUS AND CALCANEOUS FRACTURE, SUBTALAR JOINT ARTHROTOMY WITH REMOVAL OF LOOSE BODIES, TALONAVICULAR JOINT ARTHOTOMY WITH REMOVAL OF LOOSE BODIES;  Surgeon: Elsa Lonni SAUNDERS, MD;  Location: [facility] OR;  Service: Orthopedics;  Laterality: Left;   Rotor Cuff  2012   Right Shoulder   UMBILICAL HERNIA REPAIR N/A 03/26/2022   Procedure: HERNIA REPAIR UMBILICAL ADULT WITH MESH;  Surgeon: Mavis Anes, MD;  Location: [facility] ORS;  Service: General;  Laterality: N/A;    Social History:  reports that he has never smoked. He has never used smokeless tobacco. He reports that he does not currently use alcohol. He reports that he does not currently use drugs after having used the following drugs: Benzodiazepines.   Allergies  Allergen Reactions   Hydrocodone  Itching    Pt states he can tolerate percocet   Other     Pt is very difficult to waken after anesthesia, pt wife states that he's had difficulties after prior procedures    Family History  Problem Relation Age of Onset   Breast cancer Mother    Kidney failure Mother    Stroke Mother    Colon cancer Father    Bladder Cancer Father    Prostate cancer Father    Hypertension Sister    Hypothyroidism Sister    Healthy Sister    Healthy Daughter    Healthy Son    Healthy Son      Prior to Admission medications   Medication Sig Start Date End Date Taking? Authorizing Provider  acetaminophen  (TYLENOL ) 325 MG tablet Take 2 tablets (650 mg total) by mouth every 6 (six) hours as needed for mild pain (or Fever >/= 101). 05/17/19   Pearlean Manus, MD  ALPRAZolam  (XANAX ) 1 MG tablet Take 1 mg by mouth in the morning, at noon, in the evening, and at bedtime. 10/22/19   [provider]  cholecalciferol (VITAMIN D3) 25 MCG (1000 UNIT) tablet Take 1,000 Units by mouth daily.    [provider]  Cyanocobalamin (B-12 PO) Take by  mouth.    [provider]  fluticasone  (FLONASE ) 50 MCG/ACT nasal spray Place 2 sprays into both nostrils daily.    [provider]  levothyroxine  (SYNTHROID ) 112 MCG tablet Take 1 tablet (112 mcg total) by mouth daily. 12/31/22   Cook, Jayce G, DO  losartan  (COZAAR ) 50 MG tablet Take 1 tablet (50 mg total) by mouth daily. 11/02/23   Cook, Jayce G, DO  pantoprazole  (PROTONIX ) 40 MG tablet TAKE ONE TABLET BY MOUTH TWICE DAILY 12/24/23   Cook, Jayce G, DO  rosuvastatin  (CRESTOR ) 10 MG tablet Take 1 tablet (10 mg total) by mouth daily. 01/09/23   Cook, Jayce G, DO  traZODone  (DESYREL ) 150 MG tablet Take 1 tablet (150 mg total) by mouth at bedtime as needed for sleep. 05/19/23   Cook, Jayce G, DO    Physical Exam: BP 111/63  Pulse 93   Temp 99.6 F (37.6 C) (Oral)   Resp 11   Wt 103.4 kg   SpO2 94%   BMI 30.92 kg/m   General: 63 y.o. year-old male ill-appearing, on BiPAP, bilateral in no acute distress.  Alert and oriented x3. HEENT: NCAT, EOMI Neck: Supple, trachea medial Cardiovascular: Regular rate and rhythm with no rubs or gallops.  No thyromegaly or JVD noted.  No lower extremity edema. 2/4 pulses in all 4 extremities. Respiratory: Clear to auscultation with no wheezes or rales. Good inspiratory effort. Abdomen: Soft, nontender nondistended with normal bowel sounds x4 quadrants. Muskuloskeletal: No cyanosis, clubbing or edema noted bilaterally Neuro: CN II-XII intact, strength 5/5 x 4, sensation, reflexes intact Skin: No ulcerative lesions noted or rashes Psychiatry: Mood is appropriate for condition and setting          Labs on Admission:  Basic Metabolic Panel: Recent Labs  Lab 05/20/24 2209  NA 139  K 4.6  CL 100  CO2 30  GLUCOSE 136*  BUN 11  CREATININE 0.84  CALCIUM  9.0   Liver Function Tests: Recent Labs  Lab 05/20/24 2209  AST 15  ALT 16  ALKPHOS 80  BILITOT 0.3  PROT 7.0  ALBUMIN 4.2   No results for input(s): LIPASE, AMYLASE in the  last 168 hours. Recent Labs  Lab 05/20/24 2209  AMMONIA 41*   CBC: Recent Labs  Lab 05/20/24 2032  WBC 6.3  NEUTROABS 5.1  HGB 12.6*  HCT 37.5*  MCV 93.1  PLT 131*   Cardiac Enzymes: No results for input(s): CKTOTAL, CKMB, CKMBINDEX, TROPONINI in the last 168 hours.  BNP (last 3 results) No results for input(s): BNP in the last 8760 hours.  ProBNP (last 3 results) No results for input(s): PROBNP in the last 8760 hours.  CBG: No results for input(s): GLUCAP in the last 168 hours.  Radiological Exams on Admission: DG Chest Portable 1 View Result Date: 05/20/2024 CLINICAL DATA:  Hypoxia. EXAM: PORTABLE CHEST 1 VIEW COMPARISON:  Chest CT with contrast 12/23/2020, chest AP portable 12/24/2020. FINDINGS: There are 2 nearby nodules in the right mid lung measuring 9 mm and 7 mm which were seen previously, presumably benign given length of stability. The lungs are clear of infiltrates. No pleural effusion is seen. The cardiomediastinal silhouette and vascular markings are normal. Thoracic spondylosis and bilateral shoulder DJD. IMPRESSION: 1. No evidence of acute chest disease. 2. Two nearby nodules in the right mid lung measuring 9 mm and 7 mm, presumably benign given length of stability. Electronically Signed   By: Francis Quam M.D.   On: 05/20/2024 21:50    EKG: I independently viewed the EKG done and my findings are as followed: Normal sinus rhythm at a rate of 89 bpm  Assessment/Plan Present on Admission:  Acute on chronic respiratory failure with hypoxia and hypercapnia (HCC)  Recurrent aspiration pneumonia (HCC)  Esophagogastric junction outflow obstruction  Anxiety  Obstructive sleep apnea  Essential hypertension  Acquired hypothyroidism  Principal Problem:   Acute on chronic respiratory failure with hypoxia and hypercapnia (HCC) Active Problems:   Essential hypertension   Esophagogastric junction outflow obstruction   Recurrent aspiration pneumonia  (HCC)   Acquired hypothyroidism   Obstructive sleep apnea   Anxiety   Esophageal stricture   Achalasia   Substance abuse (HCC)   Hyperammonemia   Mixed hyperlipidemia  Acute on chronic respiratory failure with hypoxia and hypercapnia This may be due to multifactorial including patient being off his CPAP  for about 6 weeks due to malfunctioning and possibly aspiration pneumonia ABG: pH 7.22, pCO2 88, pO2 < 31 bicarb 36.0 Repeated ABG: pH 7.29, pCO2 76, pO2 64, bicarb 36.4 Continue BiPAP with plan to wean patient off this and transition to supplemental oxygen  with O2 sats > 92% as tolerated  Recurrent aspiration pneumonia Patient was reported to choke on saltine cracker about 2 days ago and was febrile to 102.51F yesterday, Tylenol  was taking PTA, so patient was not febrile in the ED. Patient was started on Unasyn  and azithromycin , we shall continue same at this time with plan to de-escalate/discontinue based on blood culture, sputum culture, urine Legionella, strep pneumo and procalcitonin Continue Tylenol  as needed Continue Mucinex , incentive spirometry, flutter valve  Speech eval prior to oral intake  Esophageal junction outflow obstruction Esophageal stricture Achalasia Per MEDICAL RECORD NUMBER Patient was [facility] at [facility] on 10/18/2020 by Dr. Jacob Klapper and Alm Barcelona and he was set up to proceed with a repeat EGD with intraprocedural FLIP and Botox injection.  On 12/30/2020, [facility] underwent EGD with  intraprocedural FLIP and Botox injection Continue Protonix   Anxiety Continue home alprazolam   Substance abuse Urine drug screen was positive for THC Patient was counseled on substance abuse cessation  Hyperammonemia Ammonia level 41, lactulose will be used  Obstructive sleep apnea Continue BiPAP  Essential hypertension Continue losartan   Mixed hyperlipidemia Continue Crestor   Acquired hypothyroidism Continue Synthroid    DVT prophylaxis: Lovenox   Code Status: Full  code  Family Communication: None at bedside  Consults: None  Severity of Illness: The appropriate patient status for this patient is INPATIENT. Inpatient status is judged to be reasonable and necessary in order to provide the required intensity of service to ensure the patient's safety. The patient's presenting symptoms, physical exam findings, and initial radiographic and laboratory data in the context of their chronic comorbidities is felt to place them at high risk for further clinical deterioration. Furthermore, it is not anticipated that the patient will be medically stable for discharge from the hospital within 2 midnights of admission.   * I certify that at the point of admission it is my clinical judgment that the patient will require inpatient hospital care spanning beyond 2 midnights from the point of admission due to high intensity of service, high risk for further deterioration and high frequency of surveillance required.*  Author: Shawntia Mangal, DO 05/21/2024 2:42 AM  For on call review [email or web address].

## 2024-05-21 NOTE — ED Provider Notes (Signed)
 12:48 AM Assumed care from Dr. Dean and Mliss Narrow PA, please see their note for full history, physical and decision making until this point. In brief this is a 63 y.o. year old male who presented to the ED tonight with Headache     Patient with headache and slightly sleepy likely related to hypercarbia, on bipap, pending second abg to ensure bipap helping.   Patient alert onmy exam. Wife called and stated that he did have a choking episode on Friday night which may be contributing to his current issues. Dr. Manfred updated. Will admit to icu on bipap  CRITICAL CARE Performed by: Selinda Sias Total critical care time: 35 minutes Critical care time was exclusive of separately billable procedures and treating other patients. Critical care was necessary to treat or prevent imminent or life-threatening deterioration. Critical care was time spent personally by me on the following activities: development of treatment plan with patient and/or surrogate as well as nursing, discussions with consultants, evaluation of patient's response to treatment, examination of patient, obtaining history from patient or surrogate, ordering and performing treatments and interventions, ordering and review of laboratory studies, ordering and review of radiographic studies, pulse oximetry and re-evaluation of patient's condition.   Labs, studies and imaging reviewed by myself and considered in medical decision making if ordered. Imaging interpreted by radiology.  Labs Reviewed  CBC WITH DIFFERENTIAL/PLATELET - Abnormal; Notable for the following components:      Result Value   RBC 4.03 (*)    Hemoglobin 12.6 (*)    HCT 37.5 (*)    Platelets 131 (*)    Lymphs Abs 0.6 (*)    All other components within normal limits  AMMONIA - Abnormal; Notable for the following components:   Ammonia 41 (*)    All other components within normal limits  URINE DRUG SCREEN - Abnormal; Notable for the following components:    Benzodiazepines POSITIVE (*)    Tetrahydrocannabinol POSITIVE (*)    All other components within normal limits  COMPREHENSIVE METABOLIC PANEL WITH GFR - Abnormal; Notable for the following components:   Glucose, Bld 136 (*)    All other components within normal limits  BLOOD GAS, ARTERIAL - Abnormal; Notable for the following components:   pH, Arterial 7.22 (*)    pCO2 arterial 88 (*)    pO2, Arterial <31 (*)    Bicarbonate 36.0 (*)    Acid-Base Excess 5.3 (*)    Allens test (pass/fail) POSITIVE (*)    All other components within normal limits  RESP PANEL BY RT-PCR (RSV, FLU A&B, COVID)  [id number]  CULTURE, BLOOD (ROUTINE X 2)  CULTURE, BLOOD (ROUTINE X 2)  URINALYSIS, ROUTINE W REFLEX MICROSCOPIC  LACTIC ACID, PLASMA  ETHANOL  COOXEMETRY PANEL  BLOOD GAS, ARTERIAL    DG Chest Portable 1 View  Final Result      No follow-ups on file.    Alayjah Boehringer, Selinda, MD 05/22/24 [phone]

## 2024-05-21 NOTE — Plan of Care (Signed)

## 2024-05-21 NOTE — TOC CM/SW Note (Signed)
 Transition of Care [facility]) - Inpatient Brief Assessment   Patient Details  Name: Christian Ellison MRN: [id number] Date of Birth: [birth year]  [facility] CM/SW Contact:    Lucie Lunger, LCSWA Phone Number: 05/21/2024, 10:17 AM   Clinical Narrative: Transition of Care Department [facility]) has reviewed patient and no TOC needs have been identified at this time. We will continue to monitor patient advancement through interdiciplinary progression rounds. If new patient transition needs arise, please place a TOC consult.  Transition of Care Asessment: Insurance and Status: Insurance coverage has been reviewed Patient has primary care physician: Yes Home environment has been reviewed: From home Prior level of function:: Independent Prior/Current Home Services: No current home services Social Drivers of Health Review: SDOH reviewed no interventions necessary Readmission risk has been reviewed: Yes Transition of care needs: no transition of care needs at this time

## 2024-05-21 NOTE — ED Notes (Addendum)
 Pt keeps removing Bipap to speak with wife on the phone. This nurse has communicated to pt several times the need to keep the Bipap on.

## 2024-05-21 NOTE — Progress Notes (Addendum)
 PROGRESS NOTE  Christian Ellison FMW:[id number] DOB: [birth year] DOA: 05/20/2024 PCP: [facility]  Brief History:  63 year old male with a history of esophageal dysmotility/esophageal stricture, achalasia, anxiety, hepatitis C-recovered, OSA on CPAP, recurrent aspiration pneumonia and hypothyroidism presenting with increased somnolence for the past 4 days.  The patient had received his shingles and influenza vaccinations about 4 days prior to admission.  Apparently he has had some generalized weakness since then.  In addition, the patient had choked on some saltine crackers about 2 days prior to admission.  His spouse checks and vitals prior to admission, and the patient had a temperature of 102.3 F with oxygen  saturation 74% on room air and heart rate 100-110.   The patient denies any chest pain, worsening shortness of breath, hemoptysis, nausea, vomiting, diarrhea, abdominal pain.  He denies any headache or neck pain. Notably, the patient has had a malfunctioning CPAP machine at home for the last several months.  He finally received a new machine earlier this week. In the ED, the patient had low-grade temperature of 99.6 F.  He was hemodynamically stable.  He was placed on BiPAP.  WBC 6.3, hemoglobin 12.6, platelets 131.  Sodium 139, potassium 4.6, bicarbonate 30, serum creatinine 0.84.  LFTs unremarkable.  Chest x-ray showed poor inspiration without any consolidation.  VBG 7.22/88/<31/36.  Lactic acid 1.5.  COVID-19 PCR negative.  UDS was positive for THC and benzodiazepines.  UA negative for pyuria.  Alcohol level negative.  The patient was admitted for further evaluation and treatment of his respiratory failure.   Assessment/Plan: Acute respiratory failure with hypoxia and hypercarbia - Secondary to aspiration pneumonitis in the setting of THC and benzodiazepine use - Initially on BiPAP -Initial VBG as discussed above - Wean off BiPAP as tolerated  Aspiration  pneumonitis - Continue Unasyn  and azithromycin  - Speech therapy evaluation although this is related to esophageal dysphagia  Substance use disorder - Patient uses THC - He has been taking his wife's Xanax  - PDMP reviewed--last Xanax  prescription 1 mg, #36, 08/27/2023 - Cessation discussed  Achalasia/esophageal dysmotility - Status post EGD with intraprocedural FLIP and Botox injection 12/30/2020  Hyperammonemia - Unclear clinical significance as his mental status is at baseline - Discontinue lactulose  Obstructive sleep apnea - Continue CPAP  Essential hypertension - Holding losartan  temporarily  Mixed hyperlipidemia - Continue statin  Hypothyroidism - Continue Synthroid      Total time spent 50 minutes.  Greater than 50% spent face to face counseling and coordinating care.    Family Communication:  no  Family at bedside  Consultants:  none  Code Status:  FULL   DVT Prophylaxis: [place] Lovenox    Procedures: As Listed in Progress Note Above  Antibiotics: Unasyn  104>> Azithro 10/4>>      Subjective: Patient denies fevers, chills, headache, chest pain, dyspnea, nausea, vomiting, diarrhea, abdominal pain, dysuria, hematuria, hematochezia, and melena.   Objective: Vitals:   05/21/24 0600 05/21/24 0700 05/21/24 0729 05/21/24 0800  BP: (!) 117/56 (!) 79/47  128/73  Pulse: 79 73  76  Resp: 15 15  13   Temp:   97.8 F (36.6 C)   TempSrc:   Oral   SpO2: 91% 97%  97%  Weight:      Height:        Intake/Output Summary (Last 24 hours) at 05/21/2024 0835 Last data filed at 05/21/2024 0520 Gross per 24 hour  Intake 346.04 ml  Output --  Net 346.04  ml   Weight change:  Exam:  General:  Pt is alert, follows commands appropriately, not in acute distress HEENT: No icterus, No thrush, No neck mass, [place]/AT Cardiovascular: RRR, S1/S2, no rubs, no gallops Respiratory: Bibasilar rales, right greater than left Abdomen: Soft/+BS, non tender, non distended, no  guarding Extremities: No edema, No lymphangitis, No petechiae, No rashes, no synovitis   Data Reviewed: I have personally reviewed following labs and imaging studies Basic Metabolic Panel: Recent Labs  Lab 05/20/24 2209 05/21/24 0520  NA 139 139  K 4.6 4.6  CL 100 100  CO2 30 28  GLUCOSE 136* 134*  BUN 11 8  CREATININE 0.84 0.76  CALCIUM  9.0 9.0  MG  --  2.2  PHOS  --  3.2   Liver Function Tests: Recent Labs  Lab 05/20/24 2209 05/21/24 0520  AST 15 17  ALT 16 14  ALKPHOS 80 77  BILITOT 0.3 0.4  PROT 7.0 6.9  ALBUMIN 4.2 4.1   No results for input(s): LIPASE, AMYLASE in the last 168 hours. Recent Labs  Lab 05/20/24 2209  AMMONIA 41*   Coagulation Profile: No results for input(s): INR, PROTIME in the last 168 hours. CBC: Recent Labs  Lab 05/20/24 2032 05/21/24 0624  WBC 6.3 5.4  NEUTROABS 5.1  --   HGB 12.6* 12.1*  HCT 37.5* 35.5*  MCV 93.1 92.7  PLT 131* 145*   Cardiac Enzymes: No results for input(s): CKTOTAL, CKMB, CKMBINDEX, TROPONINI in the last 168 hours. BNP: Invalid input(s): POCBNP CBG: No results for input(s): GLUCAP in the last 168 hours. HbA1C: No results for input(s): HGBA1C in the last 72 hours. Urine analysis:    Component Value Date/Time   COLORURINE YELLOW 05/20/2024 2107   APPEARANCEUR CLEAR 05/20/2024 2107   LABSPEC 1.015 05/20/2024 2107   PHURINE 5.0 05/20/2024 2107   GLUCOSEU NEGATIVE 05/20/2024 2107   HGBUR NEGATIVE 05/20/2024 2107   BILIRUBINUR NEGATIVE 05/20/2024 2107   KETONESUR NEGATIVE 05/20/2024 2107   PROTEINUR NEGATIVE 05/20/2024 2107   NITRITE NEGATIVE 05/20/2024 2107   LEUKOCYTESUR NEGATIVE 05/20/2024 2107   Sepsis Labs: @LABRCNTIP (procalcitonin:4,lacticidven:4) ) Recent Results (from the past 240 hours)  Resp panel by RT-PCR (RSV, Flu A&B, Covid) Anterior Nasal Swab     Status: None   Collection Time: 05/20/24  7:32 PM   Specimen: Anterior Nasal Swab  Result Value Ref Range Status    SARS Coronavirus 2 by RT PCR NEGATIVE NEGATIVE Final    Comment: (NOTE) SARS-CoV-2 target nucleic acids are NOT DETECTED.  The SARS-CoV-2 RNA is generally detectable in upper respiratory specimens during the acute phase of infection. The lowest concentration of SARS-CoV-2 viral copies this assay can detect is 138 copies/mL. A negative result does not preclude SARS-Cov-2 infection and should not be used as the sole basis for treatment or other patient management decisions. A negative result may occur with  improper specimen collection/handling, submission of specimen other than nasopharyngeal swab, presence of viral mutation(s) within the areas targeted by this assay, and inadequate number of viral copies(<138 copies/mL). A negative result must be combined with clinical observations, patient history, and epidemiological information. The expected result is Negative.  Fact Sheet for Patients:  BloggerCourse.com  Fact Sheet for Healthcare Providers:  SeriousBroker.it  This test is no t yet approved or cleared by the [place]  FDA and  has been authorized for detection and/or diagnosis of SARS-CoV-2 by FDA under an Emergency Use Authorization (EUA). This EUA will remain  in effect (meaning this test  can be used) for the duration of the COVID-19 declaration under Section 564(b)(1) of the Act, 21 U.S.C.section 360bbb-3(b)(1), unless the authorization is terminated  or revoked sooner.       Influenza A by PCR NEGATIVE NEGATIVE Final   Influenza B by PCR NEGATIVE NEGATIVE Final    Comment: (NOTE) The Xpert Xpress SARS-CoV-2/FLU/RSV plus assay is intended as an aid in the diagnosis of influenza from Nasopharyngeal swab specimens and should not be used as a sole basis for treatment. Nasal washings and aspirates are unacceptable for Xpert Xpress SARS-CoV-2/FLU/RSV testing.  Fact Sheet for  Patients: BloggerCourse.com  Fact Sheet for Healthcare Providers: SeriousBroker.it  This test is not yet approved or cleared by the [place]  FDA and has been authorized for detection and/or diagnosis of SARS-CoV-2 by FDA under an Emergency Use Authorization (EUA). This EUA will remain in effect (meaning this test can be used) for the duration of the COVID-19 declaration under Section 564(b)(1) of the Act, 21 U.S.C. section 360bbb-3(b)(1), unless the authorization is terminated or revoked.     Resp Syncytial Virus by PCR NEGATIVE NEGATIVE Final    Comment: (NOTE) Fact Sheet for Patients: BloggerCourse.com  Fact Sheet for Healthcare Providers: SeriousBroker.it  This test is not yet approved or cleared by the [place]  FDA and has been authorized for detection and/or diagnosis of SARS-CoV-2 by FDA under an Emergency Use Authorization (EUA). This EUA will remain in effect (meaning this test can be used) for the duration of the COVID-19 declaration under Section 564(b)(1) of the Act, 21 U.S.C. section 360bbb-3(b)(1), unless the authorization is terminated or revoked.  Performed at [facility], [address] [place], [place] [address]      Scheduled Meds:  Chlorhexidine  Gluconate Cloth  6 each Topical Daily   dextromethorphan -guaiFENesin   1 tablet Oral BID   enoxaparin  (LOVENOX ) injection  40 mg Subcutaneous Q24H   levothyroxine   112 mcg Oral Daily   pantoprazole  (PROTONIX ) IV  40 mg Intravenous Q12H   rosuvastatin   10 mg Oral Daily   Continuous Infusions:  ampicillin -sulbactam (UNASYN ) IV 200 mL/hr at 05/21/24 0520   azithromycin  Stopped (05/21/24 0432)    Procedures/Studies: DG Chest Portable 1 View Result Date: 05/20/2024 CLINICAL DATA:  Hypoxia. EXAM: PORTABLE CHEST 1 VIEW COMPARISON:  Chest CT with contrast 12/23/2020, chest AP portable 12/24/2020. FINDINGS:  There are 2 nearby nodules in the right mid lung measuring 9 mm and 7 mm which were seen previously, presumably benign given length of stability. The lungs are clear of infiltrates. No pleural effusion is seen. The cardiomediastinal silhouette and vascular markings are normal. Thoracic spondylosis and bilateral shoulder DJD. IMPRESSION: 1. No evidence of acute chest disease. 2. Two nearby nodules in the right mid lung measuring 9 mm and 7 mm, presumably benign given length of stability. Electronically Signed   By: Francis Quam M.D.   On: 05/20/2024 21:50    Alm Schneider, DO  [facility]  If 7PM-7AM, please contact night-coverage [email or web address] Password [email or web address] 05/21/2024, 8:35 AM   LOS: 0 days

## 2024-05-21 NOTE — Hospital Course (Addendum)
 63 year old male with a history of esophageal dysmotility/esophageal stricture, achalasia, anxiety, hepatitis C-recovered, OSA on CPAP, recurrent aspiration pneumonia and hypothyroidism presenting with increased somnolence for the past 4 days.  The patient had received his shingles and influenza vaccinations about 4 days prior to admission.  Apparently he has had some generalized weakness since then.  In addition, the patient had choked on some saltine crackers about 2 days prior to admission.  His spouse checks and vitals prior to admission, and the patient had a temperature of 102.3 F with oxygen  saturation 74% on room air and heart rate 100-110.   The patient denies any chest pain, worsening shortness of breath, hemoptysis, nausea, vomiting, diarrhea, abdominal pain.  He denies any headache or neck pain. Notably, the patient has had a malfunctioning CPAP machine at home for the last several months.  He finally received a new machine earlier this week. In the ED, the patient had low-grade temperature of 99.6 F.  He was hemodynamically stable.  He was placed on BiPAP.  WBC 6.3, hemoglobin 12.6, platelets 131.  Sodium 139, potassium 4.6, bicarbonate 30, serum creatinine 0.84.  LFTs unremarkable.  Chest x-ray showed poor inspiration without any consolidation.  VBG 7.22/88/<31/36.  Lactic acid 1.5.  COVID-19 PCR negative.  UDS was positive for THC and benzodiazepines.  UA negative for pyuria.  Alcohol level negative.  The patient was admitted for further evaluation and treatment of his respiratory failure.

## 2024-05-21 NOTE — ED Notes (Signed)
 Pt's speech has become more clear

## 2024-05-22 LAB — CBC
HCT: 36.4 % — ABNORMAL LOW (ref 39.0–52.0)
Hemoglobin: 12 g/dL — ABNORMAL LOW (ref 13.0–17.0)
MCH: 31.4 pg (ref 26.0–34.0)
MCHC: 33 g/dL (ref 30.0–36.0)
MCV: 95.3 fL (ref 80.0–100.0)
Platelets: 134 K/uL — ABNORMAL LOW (ref 150–400)
RBC: 3.82 MIL/uL — ABNORMAL LOW (ref 4.22–5.81)
RDW: 12.3 % (ref 11.5–15.5)
WBC: 3.9 K/uL — ABNORMAL LOW (ref 4.0–10.5)
nRBC: 0 % (ref 0.0–0.2)

## 2024-05-22 LAB — BASIC METABOLIC PANEL WITH GFR
Anion gap: 13 (ref 5–15)
BUN: 11 mg/dL (ref 8–23)
CO2: 32 mmol/L (ref 22–32)
Calcium: 8.9 mg/dL (ref 8.9–10.3)
Chloride: 99 mmol/L (ref 98–111)
Creatinine, Ser: 0.82 mg/dL (ref 0.61–1.24)
GFR, Estimated: >60 mL/min (ref >60)
Glucose, Bld: 113 mg/dL — ABNORMAL HIGH (ref 70–99)
Potassium: 3.9 mmol/L (ref 3.5–5.1)
Sodium: 144 mmol/L (ref 135–145)

## 2024-05-22 LAB — MAGNESIUM: Magnesium: 2.2 mg/dL (ref 1.7–2.4)

## 2024-05-22 LAB — PHOSPHORUS: Phosphorus: 3.1 mg/dL (ref 2.5–4.6)

## 2024-05-22 MED ORDER — AZITHROMYCIN 250 MG PO TABS: 500.0000 mg | ORAL_TABLET | Freq: Every day | ORAL | Status: DC

## 2024-05-22 MED ORDER — AZITHROMYCIN 500 MG PO TABS: 500.0000 mg | ORAL_TABLET | Freq: Every day | ORAL | 0 refills | Status: AC

## 2024-05-22 MED ORDER — AMOXICILLIN-POT CLAVULANATE 875-125 MG PO TABS
1.0000 | ORAL_TABLET | Freq: Two times a day (BID) | ORAL | Status: DC
  Administered 2024-05-22: 1 via ORAL
  Filled 2024-05-22: qty 1

## 2024-05-22 MED ORDER — AMOXICILLIN-POT CLAVULANATE 875-125 MG PO TABS: 1.0000 | ORAL_TABLET | Freq: Two times a day (BID) | ORAL | 0 refills | Status: AC

## 2024-05-22 NOTE — Progress Notes (Signed)
 SATURATION QUALIFICATIONS: (This note is used to comply with regulatory documentation for home oxygen )  Patient Saturations on Room Air at Rest = 94%  Patient Saturations on Room Air while Ambulating = 92-100%

## 2024-05-22 NOTE — Progress Notes (Signed)
 Patient alert and oriented x4. No complaints of pain, shortness of breath, chest pain, dizziness, nausea or vomiting. Patient tolerated PO meds and diet well, appetite good. Patient ambulated independently with steady gait. Discharge summary and medication education done with patient. All questions answered and patient expressed full understanding of summary and education with teach back. Patient denies any pain in the left arm, no redness noted where IV was originally. Patient able to bend and use arm with no issues and no complaints. Patient discharged with all belongings for home via car.

## 2024-05-22 NOTE — Discharge Summary (Signed)
 Physician Discharge Summary   Patient: Christian Ellison MRN: [id number] DOB: 1961/02/20  Admit date:     05/20/2024  Discharge date: 05/22/24  Discharge Physician: Alm Richard Holz   PCP: [facility]   Recommendations at discharge:   Please follow up with primary care provider within 1-2 weeks  Please repeat BMP and CBC in one week    Hospital Course: 63 year old male with a history of esophageal dysmotility/esophageal stricture, achalasia, anxiety, hepatitis C-recovered, OSA on CPAP, recurrent aspiration pneumonia and hypothyroidism presenting with increased somnolence for the past 4 days.  The patient had received his shingles and influenza vaccinations about 4 days prior to admission.  Apparently he has had some generalized weakness since then.  In addition, the patient had choked on some saltine crackers about 2 days prior to admission.  His spouse checks and vitals prior to admission, and the patient had a temperature of 102.3 F with oxygen  saturation 74% on room air and heart rate 100-110.   The patient denies any chest pain, worsening shortness of breath, hemoptysis, nausea, vomiting, diarrhea, abdominal pain.  He denies any headache or neck pain. Notably, the patient has had a malfunctioning CPAP machine at home for the last several months.  He finally received a new machine earlier this week. In the ED, the patient had low-grade temperature of 99.6 F.  He was hemodynamically stable.  He was placed on BiPAP.  WBC 6.3, hemoglobin 12.6, platelets 131.  Sodium 139, potassium 4.6, bicarbonate 30, serum creatinine 0.84.  LFTs unremarkable.  Chest x-ray showed poor inspiration without any consolidation.  VBG 7.22/88/<31/36.  Lactic acid 1.5.  COVID-19 PCR negative.  UDS was positive for THC and benzodiazepines.  UA negative for pyuria.  Alcohol level negative.  The patient was admitted for further evaluation and treatment of his respiratory failure.  Assessment and Plan:  Acute  respiratory failure with hypoxia and hypercarbia - Secondary to aspiration pneumonitis in the setting of THC and benzodiazepine use - Initially on BiPAP -Initial VBG as discussed above - Wean off BiPAP as tolerated - weaned to room air - ambulated on RA without desaturation <90% on day of d/c   Aspiration pneumonitis - Continue Unasyn  and azithromycin  - Speech therapy evaluation although this is related to esophageal dysphagia - d/c home with 6 more days amox/clav and 3 days azithro   Substance use disorder - Patient uses THC - He has been taking his wife's Xanax  - PDMP reviewed--last Xanax  prescription 1 mg, #36, 08/27/2023 - Cessation discussed   Achalasia/esophageal dysmotility - Status post EGD with intraprocedural FLIP and Botox injection 12/30/2020   Hyperammonemia - Unclear clinical significance as his mental status is at baseline - Discontinue lactulose - mental status clear   Obstructive sleep apnea - Continue CPAP   Essential hypertension - Holding losartan  temporarily>>restart after d/c   Mixed hyperlipidemia - Continue statin   Hypothyroidism - Continue Synthroid         Consultants: none Procedures performed: none  Disposition: Home Diet recommendation:  Regular diet DISCHARGE MEDICATION: Allergies as of 05/22/2024       Reactions   Hydrocodone  Itching   Pt states he can tolerate percocet   Other    Pt is very difficult to waken after anesthesia, pt wife states that he's had difficulties after prior procedures        Medication List     TAKE these medications    acetaminophen  325 MG tablet Commonly known as: TYLENOL  Take 2 tablets (650 mg  total) by mouth every 6 (six) hours as needed for mild pain (or Fever >/= 101).   ALPRAZolam  1 MG tablet Commonly known as: XANAX  Take 1 mg by mouth 3 (three) times daily as needed for anxiety.   amoxicillin -clavulanate 875-125 MG tablet Commonly known as: AUGMENTIN  Take 1 tablet by mouth every 12  (twelve) hours.   azithromycin  500 MG tablet Commonly known as: ZITHROMAX  Take 1 tablet (500 mg total) by mouth daily. Start taking on: May 23, 2024   fluticasone  50 MCG/ACT nasal spray Commonly known as: FLONASE  Place 2 sprays into both nostrils daily as needed for allergies.   levETIRAcetam 500 MG tablet Commonly known as: KEPPRA Take 500 mg by mouth 2 (two) times daily.   levothyroxine  112 MCG tablet Commonly known as: SYNTHROID  Take 1 tablet (112 mcg total) by mouth daily.   olmesartan-hydrochlorothiazide 40-12.5 MG tablet Commonly known as: BENICAR HCT Take 1 tablet by mouth daily.   pantoprazole  40 MG tablet Commonly known as: PROTONIX  TAKE ONE TABLET BY MOUTH TWICE DAILY   rosuvastatin  10 MG tablet Commonly known as: Crestor  Take 1 tablet (10 mg total) by mouth daily.   traZODone  100 MG tablet Commonly known as: DESYREL  Take 200 mg by mouth at bedtime.   venlafaxine XR 150 MG 24 hr capsule Commonly known as: EFFEXOR-XR Take 150 mg by mouth daily.        Discharge Exam: Filed Weights   05/20/24 1933 05/21/24 0305  Weight: 103.4 kg 104.8 kg   HEENT:  [place]/AT, No thrush, no icterus CV:  RRR, no rub, no S3, no S4 Lung:  bibasilar rales. No wheeze Abd:  soft/+BS, NT Ext:  No edema, no lymphangitis, no synovitis, no rash   Condition at discharge: stable  The results of significant diagnostics from this hospitalization (including imaging, microbiology, ancillary and laboratory) are listed below for reference.   Imaging Studies: DG Chest Portable 1 View Result Date: 05/20/2024 CLINICAL DATA:  Hypoxia. EXAM: PORTABLE CHEST 1 VIEW COMPARISON:  Chest CT with contrast 12/23/2020, chest AP portable 12/24/2020. FINDINGS: There are 2 nearby nodules in the right mid lung measuring 9 mm and 7 mm which were seen previously, presumably benign given length of stability. The lungs are clear of infiltrates. No pleural effusion is seen. The cardiomediastinal silhouette  and vascular markings are normal. Thoracic spondylosis and bilateral shoulder DJD. IMPRESSION: 1. No evidence of acute chest disease. 2. Two nearby nodules in the right mid lung measuring 9 mm and 7 mm, presumably benign given length of stability. Electronically Signed   By: Francis Quam M.D.   On: 05/20/2024 21:50    Microbiology: Results for orders placed or performed during the hospital encounter of 05/20/24  Resp panel by RT-PCR (RSV, Flu A&B, Covid) Anterior Nasal Swab     Status: None   Collection Time: 05/20/24  7:32 PM   Specimen: Anterior Nasal Swab  Result Value Ref Range Status   SARS Coronavirus 2 by RT PCR NEGATIVE NEGATIVE Final    Comment: (NOTE) SARS-CoV-2 target nucleic acids are NOT DETECTED.  The SARS-CoV-2 RNA is generally detectable in upper respiratory specimens during the acute phase of infection. The lowest concentration of SARS-CoV-2 viral copies this assay can detect is 138 copies/mL. A negative result does not preclude SARS-Cov-2 infection and should not be used as the sole basis for treatment or other patient management decisions. A negative result may occur with  improper specimen collection/handling, submission of specimen other than nasopharyngeal swab, presence of viral  mutation(s) within the areas targeted by this assay, and inadequate number of viral copies(<138 copies/mL). A negative result must be combined with clinical observations, patient history, and epidemiological information. The expected result is Negative.  Fact Sheet for Patients:  BloggerCourse.com  Fact Sheet for Healthcare Providers:  SeriousBroker.it  This test is no t yet approved or cleared by the [place]  FDA and  has been authorized for detection and/or diagnosis of SARS-CoV-2 by FDA under an Emergency Use Authorization (EUA). This EUA will remain  in effect (meaning this test can be used) for the duration of the COVID-19  declaration under Section 564(b)(1) of the Act, 21 U.S.C.section 360bbb-3(b)(1), unless the authorization is terminated  or revoked sooner.       Influenza A by PCR NEGATIVE NEGATIVE Final   Influenza B by PCR NEGATIVE NEGATIVE Final    Comment: (NOTE) The Xpert Xpress SARS-CoV-2/FLU/RSV plus assay is intended as an aid in the diagnosis of influenza from Nasopharyngeal swab specimens and should not be used as a sole basis for treatment. Nasal washings and aspirates are unacceptable for Xpert Xpress SARS-CoV-2/FLU/RSV testing.  Fact Sheet for Patients: BloggerCourse.com  Fact Sheet for Healthcare Providers: SeriousBroker.it  This test is not yet approved or cleared by the [place]  FDA and has been authorized for detection and/or diagnosis of SARS-CoV-2 by FDA under an Emergency Use Authorization (EUA). This EUA will remain in effect (meaning this test can be used) for the duration of the COVID-19 declaration under Section 564(b)(1) of the Act, 21 U.S.C. section 360bbb-3(b)(1), unless the authorization is terminated or revoked.     Resp Syncytial Virus by PCR NEGATIVE NEGATIVE Final    Comment: (NOTE) Fact Sheet for Patients: BloggerCourse.com  Fact Sheet for Healthcare Providers: SeriousBroker.it  This test is not yet approved or cleared by the [place]  FDA and has been authorized for detection and/or diagnosis of SARS-CoV-2 by FDA under an Emergency Use Authorization (EUA). This EUA will remain in effect (meaning this test can be used) for the duration of the COVID-19 declaration under Section 564(b)(1) of the Act, 21 U.S.C. section 360bbb-3(b)(1), unless the authorization is terminated or revoked.  Performed at [facility], [address], [place], [place] [address]   Blood culture (routine x 2)     Status: None (Preliminary result)   Collection Time: 05/20/24   8:32 PM   Specimen: BLOOD  Result Value Ref Range Status   Specimen Description BLOOD LEFT ANTECUBITAL  Final   Special Requests   Final    AEROBIC BOTTLE ONLY Blood Culture results may not be optimal due to an inadequate volume of blood received in culture bottles   Culture   Final    NO GROWTH 2 DAYS Performed at [facility], [address] [place], [place] [address]    Report Status PENDING  Incomplete  Blood culture (routine x 2)     Status: None (Preliminary result)   Collection Time: 05/20/24 10:09 PM   Specimen: BLOOD  Result Value Ref Range Status   Specimen Description BLOOD BLOOD LEFT HAND  Final   Special Requests   Final    BOTTLES DRAWN AEROBIC AND ANAEROBIC Blood Culture adequate volume   Culture   Final    NO GROWTH 2 DAYS Performed at [facility], [address] [place], [place] [address]    Report Status PENDING  Incomplete  MRSA Next Gen by PCR, Nasal     Status: None   Collection Time: 05/21/24  3:00 AM  Result Value Ref Range Status   MRSA by PCR Next Gen NOT DETECTED NOT DETECTED Final    Comment: (NOTE) The GeneXpert MRSA Assay (FDA approved for NASAL specimens only), is one component of a comprehensive MRSA colonization surveillance program. It is not intended to diagnose MRSA infection nor to guide or monitor treatment for MRSA infections. Test performance is not FDA approved in patients less than 82 years old. Performed at [facility], [address] [place], [place] [address]     Labs: CBC: Recent Labs  Lab 05/20/24 2032 05/21/24 0624 05/22/24 0700  WBC 6.3 5.4 3.9*  NEUTROABS 5.1  --   --   HGB 12.6* 12.1* 12.0*  HCT 37.5* 35.5* 36.4*  MCV 93.1 92.7 95.3  PLT 131* 145* 134*   Basic Metabolic Panel: Recent Labs  Lab 05/20/24 2209 05/21/24 0520 05/22/24 0700  NA 139 139 144  K 4.6 4.6 3.9  CL 100 100 99  CO2 30 28 32  GLUCOSE 136* 134* 113*  BUN 11 8 11   CREATININE 0.84 0.76 0.82  CALCIUM  9.0 9.0 8.9  MG  --  2.2 2.2  PHOS   --  3.2 3.1   Liver Function Tests: Recent Labs  Lab 05/20/24 2209 05/21/24 0520  AST 15 17  ALT 16 14  ALKPHOS 80 77  BILITOT 0.3 0.4  PROT 7.0 6.9  ALBUMIN 4.2 4.1   CBG: No results for input(s): GLUCAP in the last 168 hours.  Discharge time spent: greater than 30 minutes.  Signed: Alm Schneider, MD [facility] 05/22/2024

## 2024-05-23 LAB — LEGIONELLA PNEUMOPHILA SEROGP 1 UR AG: L. pneumophila Serogp 1 Ur Ag: NEGATIVE

## 2024-05-25 LAB — CULTURE, BLOOD (ROUTINE X 2)
Culture: NO GROWTH
Culture: NO GROWTH
Special Requests: ADEQUATE

## 2024-06-05 DIAGNOSIS — R350 Frequency of micturition: Secondary | ICD-10-CM | POA: Diagnosis not present

## 2024-06-05 DIAGNOSIS — G4733 Obstructive sleep apnea (adult) (pediatric): Secondary | ICD-10-CM | POA: Diagnosis not present

## 2024-06-05 DIAGNOSIS — E039 Hypothyroidism, unspecified: Secondary | ICD-10-CM | POA: Diagnosis not present

## 2024-06-05 DIAGNOSIS — I10 Essential (primary) hypertension: Secondary | ICD-10-CM | POA: Diagnosis not present

## 2024-06-05 DIAGNOSIS — Z09 Encounter for follow-up examination after completed treatment for conditions other than malignant neoplasm: Secondary | ICD-10-CM | POA: Diagnosis not present

## 2024-06-05 DIAGNOSIS — R7301 Impaired fasting glucose: Secondary | ICD-10-CM | POA: Diagnosis not present

## 2024-06-05 DIAGNOSIS — E7849 Other hyperlipidemia: Secondary | ICD-10-CM | POA: Diagnosis not present

## 2024-06-05 DIAGNOSIS — K2100 Gastro-esophageal reflux disease with esophagitis, without bleeding: Secondary | ICD-10-CM | POA: Diagnosis not present

## 2024-07-12 ENCOUNTER — Other Ambulatory Visit: Payer: Self-pay | Admitting: Family Medicine

## 2024-10-03 ENCOUNTER — Ambulatory Visit: Admitting: Neurology
